# Patient Record
Sex: Male | Born: 1945 | Race: White | Hispanic: No | Marital: Single | Smoking: Former smoker
Health system: Southern US, Community
[De-identification: ages and names within clinical notes are randomized; demographics above are authoritative.]

## PROBLEM LIST (undated history)

## (undated) DIAGNOSIS — K5792 Diverticulitis of intestine, part unspecified, without perforation or abscess without bleeding: Secondary | ICD-10-CM

## (undated) DIAGNOSIS — H9190 Unspecified hearing loss, unspecified ear: Secondary | ICD-10-CM

## (undated) DIAGNOSIS — R9439 Abnormal result of other cardiovascular function study: Secondary | ICD-10-CM

## (undated) DIAGNOSIS — F329 Major depressive disorder, single episode, unspecified: Secondary | ICD-10-CM

## (undated) DIAGNOSIS — IMO0001 Reserved for inherently not codable concepts without codable children: Secondary | ICD-10-CM

## (undated) DIAGNOSIS — Z860100 Personal history of colon polyps, unspecified: Secondary | ICD-10-CM

## (undated) DIAGNOSIS — K219 Gastro-esophageal reflux disease without esophagitis: Secondary | ICD-10-CM

## (undated) DIAGNOSIS — E119 Type 2 diabetes mellitus without complications: Secondary | ICD-10-CM

## (undated) DIAGNOSIS — I10 Essential (primary) hypertension: Secondary | ICD-10-CM

## (undated) DIAGNOSIS — R42 Dizziness and giddiness: Secondary | ICD-10-CM

## (undated) DIAGNOSIS — F419 Anxiety disorder, unspecified: Secondary | ICD-10-CM

## (undated) DIAGNOSIS — A159 Respiratory tuberculosis unspecified: Secondary | ICD-10-CM

## (undated) DIAGNOSIS — M549 Dorsalgia, unspecified: Secondary | ICD-10-CM

## (undated) DIAGNOSIS — E785 Hyperlipidemia, unspecified: Secondary | ICD-10-CM

## (undated) DIAGNOSIS — E039 Hypothyroidism, unspecified: Secondary | ICD-10-CM

## (undated) DIAGNOSIS — R531 Weakness: Secondary | ICD-10-CM

## (undated) DIAGNOSIS — Z87442 Personal history of urinary calculi: Secondary | ICD-10-CM

## (undated) DIAGNOSIS — M1990 Unspecified osteoarthritis, unspecified site: Secondary | ICD-10-CM

## (undated) DIAGNOSIS — F32A Depression, unspecified: Secondary | ICD-10-CM

## (undated) DIAGNOSIS — K589 Irritable bowel syndrome without diarrhea: Secondary | ICD-10-CM

## (undated) DIAGNOSIS — R51 Headache: Secondary | ICD-10-CM

## (undated) DIAGNOSIS — G8929 Other chronic pain: Secondary | ICD-10-CM

## (undated) DIAGNOSIS — I2510 Atherosclerotic heart disease of native coronary artery without angina pectoris: Secondary | ICD-10-CM

## (undated) HISTORY — PX: LITHOTRIPSY: SUR834

## (undated) HISTORY — PX: COLONOSCOPY: SHX174

## (undated) HISTORY — PX: BACK SURGERY: SHX140

## (undated) HISTORY — PX: SHOULDER ARTHROSCOPY W/ ROTATOR CUFF REPAIR: SHX2400

## (undated) HISTORY — PX: CYSTOSCOPY: SUR368

## (undated) HISTORY — DX: Abnormal result of other cardiovascular function study: R94.39

## (undated) HISTORY — PX: OTHER SURGICAL HISTORY: SHX169

## (undated) HISTORY — PX: CORONARY ANGIOPLASTY: SHX604

---

## 1968-12-13 HISTORY — PX: CHOLECYSTECTOMY: SHX55

## 1997-10-25 ENCOUNTER — Emergency Department (HOSPITAL_COMMUNITY): Admission: EM | Admit: 1997-10-25 | Discharge: 1997-10-25 | Payer: Self-pay | Admitting: Internal Medicine

## 1997-11-20 ENCOUNTER — Inpatient Hospital Stay (HOSPITAL_COMMUNITY): Admission: EM | Admit: 1997-11-20 | Discharge: 1997-11-21 | Payer: Self-pay | Admitting: Emergency Medicine

## 1997-12-17 ENCOUNTER — Inpatient Hospital Stay (HOSPITAL_COMMUNITY): Admission: EM | Admit: 1997-12-17 | Discharge: 1997-12-19 | Payer: Self-pay | Admitting: Emergency Medicine

## 1997-12-25 ENCOUNTER — Encounter: Admission: RE | Admit: 1997-12-25 | Discharge: 1998-03-25 | Payer: Self-pay | Admitting: Internal Medicine

## 1999-05-07 ENCOUNTER — Emergency Department (HOSPITAL_COMMUNITY): Admission: EM | Admit: 1999-05-07 | Discharge: 1999-05-07 | Payer: Self-pay | Admitting: Emergency Medicine

## 1999-06-03 ENCOUNTER — Encounter: Payer: Self-pay | Admitting: Neurology

## 1999-06-03 ENCOUNTER — Encounter: Admission: RE | Admit: 1999-06-03 | Discharge: 1999-06-03 | Payer: Self-pay | Admitting: Neurology

## 2002-05-04 ENCOUNTER — Encounter: Payer: Self-pay | Admitting: Urology

## 2002-05-04 ENCOUNTER — Encounter: Admission: RE | Admit: 2002-05-04 | Discharge: 2002-05-04 | Payer: Self-pay | Admitting: Urology

## 2006-09-30 ENCOUNTER — Emergency Department (HOSPITAL_COMMUNITY): Admission: EM | Admit: 2006-09-30 | Discharge: 2006-09-30 | Payer: Self-pay | Admitting: Emergency Medicine

## 2006-10-05 ENCOUNTER — Encounter: Admission: RE | Admit: 2006-10-05 | Discharge: 2006-10-05 | Payer: Self-pay | Admitting: *Deleted

## 2006-10-09 ENCOUNTER — Encounter (INDEPENDENT_AMBULATORY_CARE_PROVIDER_SITE_OTHER): Payer: Self-pay | Admitting: *Deleted

## 2006-10-09 ENCOUNTER — Ambulatory Visit (HOSPITAL_COMMUNITY): Admission: RE | Admit: 2006-10-09 | Discharge: 2006-10-09 | Payer: Self-pay | Admitting: *Deleted

## 2007-01-15 ENCOUNTER — Emergency Department (HOSPITAL_COMMUNITY): Admission: EM | Admit: 2007-01-15 | Discharge: 2007-01-15 | Payer: Self-pay | Admitting: *Deleted

## 2007-04-15 HISTORY — PX: ANTERIOR CERVICAL DECOMP/DISCECTOMY FUSION: SHX1161

## 2007-04-19 ENCOUNTER — Ambulatory Visit (HOSPITAL_COMMUNITY): Admission: RE | Admit: 2007-04-19 | Discharge: 2007-04-20 | Payer: Self-pay | Admitting: Neurosurgery

## 2007-04-22 ENCOUNTER — Encounter: Admission: RE | Admit: 2007-04-22 | Discharge: 2007-04-22 | Payer: Self-pay | Admitting: Neurosurgery

## 2007-08-10 ENCOUNTER — Ambulatory Visit (HOSPITAL_COMMUNITY): Admission: RE | Admit: 2007-08-10 | Discharge: 2007-08-10 | Payer: Self-pay | Admitting: Physician Assistant

## 2007-08-11 ENCOUNTER — Encounter: Admission: RE | Admit: 2007-08-11 | Discharge: 2007-08-11 | Payer: Self-pay | Admitting: Interventional Cardiology

## 2007-11-27 ENCOUNTER — Encounter: Admission: RE | Admit: 2007-11-27 | Discharge: 2007-11-27 | Payer: Self-pay | Admitting: Neurosurgery

## 2007-12-29 ENCOUNTER — Encounter: Admission: RE | Admit: 2007-12-29 | Discharge: 2007-12-29 | Payer: Self-pay | Admitting: Neurosurgery

## 2008-10-14 ENCOUNTER — Emergency Department (HOSPITAL_COMMUNITY): Admission: EM | Admit: 2008-10-14 | Discharge: 2008-10-14 | Payer: Self-pay | Admitting: Emergency Medicine

## 2008-10-30 ENCOUNTER — Inpatient Hospital Stay (HOSPITAL_COMMUNITY): Admission: RE | Admit: 2008-10-30 | Discharge: 2008-11-05 | Payer: Self-pay | Admitting: Neurosurgery

## 2008-10-31 ENCOUNTER — Ambulatory Visit: Payer: Self-pay | Admitting: Pulmonary Disease

## 2009-09-30 ENCOUNTER — Inpatient Hospital Stay (HOSPITAL_COMMUNITY): Admission: EM | Admit: 2009-09-30 | Discharge: 2009-10-04 | Payer: Self-pay | Admitting: Emergency Medicine

## 2010-04-29 ENCOUNTER — Encounter
Admission: RE | Admit: 2010-04-29 | Discharge: 2010-04-29 | Payer: Self-pay | Source: Home / Self Care | Attending: Neurosurgery | Admitting: Neurosurgery

## 2010-06-30 LAB — GLUCOSE, CAPILLARY
Glucose-Capillary: 108 mg/dL — ABNORMAL HIGH (ref 70–99)
Glucose-Capillary: 109 mg/dL — ABNORMAL HIGH (ref 70–99)
Glucose-Capillary: 112 mg/dL — ABNORMAL HIGH (ref 70–99)
Glucose-Capillary: 129 mg/dL — ABNORMAL HIGH (ref 70–99)
Glucose-Capillary: 135 mg/dL — ABNORMAL HIGH (ref 70–99)
Glucose-Capillary: 138 mg/dL — ABNORMAL HIGH (ref 70–99)
Glucose-Capillary: 147 mg/dL — ABNORMAL HIGH (ref 70–99)
Glucose-Capillary: 174 mg/dL — ABNORMAL HIGH (ref 70–99)
Glucose-Capillary: 205 mg/dL — ABNORMAL HIGH (ref 70–99)
Glucose-Capillary: 95 mg/dL (ref 70–99)
Glucose-Capillary: 97 mg/dL (ref 70–99)

## 2010-06-30 LAB — BASIC METABOLIC PANEL
BUN: 6 mg/dL (ref 6–23)
BUN: 7 mg/dL (ref 6–23)
BUN: 9 mg/dL (ref 6–23)
Calcium: 8.9 mg/dL (ref 8.4–10.5)
Calcium: 9.2 mg/dL (ref 8.4–10.5)
Chloride: 101 mEq/L (ref 96–112)
Chloride: 104 mEq/L (ref 96–112)
Creatinine, Ser: 0.83 mg/dL (ref 0.4–1.5)
GFR calc Af Amer: >60 mL/min (ref >60)
GFR calc non Af Amer: >60 mL/min (ref >60)
GFR calc non Af Amer: >60 mL/min (ref >60)
GFR calc non Af Amer: >60 mL/min (ref >60)
Glucose, Bld: 121 mg/dL — ABNORMAL HIGH (ref 70–99)
Glucose, Bld: 132 mg/dL — ABNORMAL HIGH (ref 70–99)
Glucose, Bld: 135 mg/dL — ABNORMAL HIGH (ref 70–99)
Glucose, Bld: 193 mg/dL — ABNORMAL HIGH (ref 70–99)
Potassium: 4 mEq/L (ref 3.5–5.1)
Potassium: 4.1 mEq/L (ref 3.5–5.1)
Potassium: 4.4 mEq/L (ref 3.5–5.1)
Sodium: 136 mEq/L (ref 135–145)
Sodium: 136 mEq/L (ref 135–145)

## 2010-06-30 LAB — CBC
HCT: 38.5 % — ABNORMAL LOW (ref 39.0–52.0)
HCT: 40.9 % (ref 39.0–52.0)
Hemoglobin: 12.6 g/dL — ABNORMAL LOW (ref 13.0–17.0)
Hemoglobin: 14 g/dL (ref 13.0–17.0)
Hemoglobin: 14.6 g/dL (ref 13.0–17.0)
MCHC: 34.6 g/dL (ref 30.0–36.0)
MCV: 88.4 fL (ref 78.0–100.0)
MCV: 88.7 fL (ref 78.0–100.0)
Platelets: 165 10*3/uL (ref 150–400)
Platelets: 180 10*3/uL (ref 150–400)
Platelets: 184 10*3/uL (ref 150–400)
Platelets: 193 10*3/uL (ref 150–400)
RBC: 4.46 MIL/uL (ref 4.22–5.81)
RDW: 13.5 % (ref 11.5–15.5)
RDW: 13.6 % (ref 11.5–15.5)
RDW: 14.2 % (ref 11.5–15.5)
WBC: 10.1 10*3/uL (ref 4.0–10.5)
WBC: 6.4 10*3/uL (ref 4.0–10.5)
WBC: 6.6 10*3/uL (ref 4.0–10.5)
WBC: 8.8 10*3/uL (ref 4.0–10.5)

## 2010-06-30 LAB — URINALYSIS, ROUTINE W REFLEX MICROSCOPIC
Glucose, UA: NEGATIVE mg/dL
Nitrite: NEGATIVE
Specific Gravity, Urine: 1.021 (ref 1.005–1.030)
pH: 5 (ref 5.0–8.0)

## 2010-06-30 LAB — COMPREHENSIVE METABOLIC PANEL
ALT: 16 U/L (ref 0–53)
ALT: 18 U/L (ref 0–53)
Albumin: 4.2 g/dL (ref 3.5–5.2)
Alkaline Phosphatase: 39 U/L (ref 39–117)
Alkaline Phosphatase: 41 U/L (ref 39–117)
BUN: 12 mg/dL (ref 6–23)
BUN: 8 mg/dL (ref 6–23)
CO2: 28 mEq/L (ref 19–32)
Chloride: 101 mEq/L (ref 96–112)
Chloride: 98 mEq/L (ref 96–112)
GFR calc non Af Amer: >60 mL/min (ref >60)
Glucose, Bld: 108 mg/dL — ABNORMAL HIGH (ref 70–99)
Glucose, Bld: 108 mg/dL — ABNORMAL HIGH (ref 70–99)
Potassium: 4 mEq/L (ref 3.5–5.1)
Potassium: 4.9 mEq/L (ref 3.5–5.1)
Sodium: 133 mEq/L — ABNORMAL LOW (ref 135–145)
Sodium: 134 mEq/L — ABNORMAL LOW (ref 135–145)
Total Bilirubin: 0.8 mg/dL (ref 0.3–1.2)
Total Bilirubin: 0.9 mg/dL (ref 0.3–1.2)
Total Protein: 6.6 g/dL (ref 6.0–8.3)

## 2010-06-30 LAB — DIFFERENTIAL
Basophils Absolute: 0 10*3/uL (ref 0.0–0.1)
Basophils Relative: 1 % (ref 0–1)
Eosinophils Absolute: 0.3 10*3/uL (ref 0.0–0.7)
Monocytes Absolute: 0.6 10*3/uL (ref 0.1–1.0)
Monocytes Relative: 6 % (ref 3–12)
Neutro Abs: 7.7 10*3/uL (ref 1.7–7.7)

## 2010-06-30 LAB — AMYLASE: Amylase: 44 U/L (ref 0–105)

## 2010-06-30 LAB — CULTURE, BLOOD (ROUTINE X 2): Culture: NO GROWTH

## 2010-07-21 LAB — DIFFERENTIAL
Basophils Absolute: 0 10*3/uL (ref 0.0–0.1)
Basophils Absolute: 0 10*3/uL (ref 0.0–0.1)
Basophils Relative: 0 % (ref 0–1)
Basophils Relative: 0 % (ref 0–1)
Eosinophils Absolute: 0.3 10*3/uL (ref 0.0–0.7)
Eosinophils Absolute: 0.3 10*3/uL (ref 0.0–0.7)
Eosinophils Relative: 3 % (ref 0–5)
Eosinophils Relative: 5 % (ref 0–5)
Lymphocytes Relative: 14 % (ref 12–46)
Lymphocytes Relative: 6 % — ABNORMAL LOW (ref 12–46)
Lymphs Abs: 0.7 10*3/uL (ref 0.7–4.0)
Lymphs Abs: 1 10*3/uL (ref 0.7–4.0)
Monocytes Absolute: 0.7 10*3/uL (ref 0.1–1.0)
Monocytes Absolute: 0.8 10*3/uL (ref 0.1–1.0)
Monocytes Relative: 10 % (ref 3–12)
Monocytes Relative: 8 % (ref 3–12)
Neutro Abs: 4.9 10*3/uL (ref 1.7–7.7)
Neutro Abs: 8.9 10*3/uL — ABNORMAL HIGH (ref 1.7–7.7)
Neutrophils Relative %: 70 % (ref 43–77)
Neutrophils Relative %: 83 % — ABNORMAL HIGH (ref 43–77)

## 2010-07-21 LAB — TYPE AND SCREEN
ABO/RH(D): A POS
Antibody Screen: NEGATIVE

## 2010-07-21 LAB — CBC
HCT: 27.8 % — ABNORMAL LOW (ref 39.0–52.0)
HCT: 29.4 % — ABNORMAL LOW (ref 39.0–52.0)
HCT: 29.9 % — ABNORMAL LOW (ref 39.0–52.0)
HCT: 32.4 % — ABNORMAL LOW (ref 39.0–52.0)
HCT: 33.3 % — ABNORMAL LOW (ref 39.0–52.0)
HCT: 43 % (ref 39.0–52.0)
Hemoglobin: 10.1 g/dL — ABNORMAL LOW (ref 13.0–17.0)
Hemoglobin: 10.4 g/dL — ABNORMAL LOW (ref 13.0–17.0)
Hemoglobin: 11.2 g/dL — ABNORMAL LOW (ref 13.0–17.0)
Hemoglobin: 11.5 g/dL — ABNORMAL LOW (ref 13.0–17.0)
Hemoglobin: 9.7 g/dL — ABNORMAL LOW (ref 13.0–17.0)
Hemoglobin: 14.8 g/dL (ref 13.0–17.0)
MCHC: 34.3 g/dL (ref 30.0–36.0)
MCHC: 34.6 g/dL (ref 30.0–36.0)
MCHC: 34.9 g/dL (ref 30.0–36.0)
MCHC: 34.4 g/dL (ref 30.0–36.0)
MCV: 88.8 fL (ref 78.0–100.0)
MCV: 89.6 fL (ref 78.0–100.0)
MCV: 89.9 fL (ref 78.0–100.0)
MCV: 89.3 fL (ref 78.0–100.0)
Platelets: 133 10*3/uL — ABNORMAL LOW (ref 150–400)
Platelets: 150 10*3/uL (ref 150–400)
Platelets: 221 10*3/uL (ref 150–400)
RBC: 3.13 MIL/uL — ABNORMAL LOW (ref 4.22–5.81)
RBC: 3.29 MIL/uL — ABNORMAL LOW (ref 4.22–5.81)
RBC: 3.33 MIL/uL — ABNORMAL LOW (ref 4.22–5.81)
RBC: 3.6 MIL/uL — ABNORMAL LOW (ref 4.22–5.81)
RBC: 4.81 MIL/uL (ref 4.22–5.81)
RDW: 13.9 % (ref 11.5–15.5)
RDW: 14 % (ref 11.5–15.5)
RDW: 14 % (ref 11.5–15.5)
RDW: 13.6 % (ref 11.5–15.5)
WBC: 10.4 10*3/uL (ref 4.0–10.5)
WBC: 10.7 10*3/uL — ABNORMAL HIGH (ref 4.0–10.5)
WBC: 6.9 10*3/uL (ref 4.0–10.5)
WBC: 9.4 10*3/uL (ref 4.0–10.5)
WBC: 8.9 10*3/uL (ref 4.0–10.5)

## 2010-07-21 LAB — BASIC METABOLIC PANEL
BUN: 18 mg/dL (ref 6–23)
BUN: 28 mg/dL — ABNORMAL HIGH (ref 6–23)
BUN: 30 mg/dL — ABNORMAL HIGH (ref 6–23)
BUN: 31 mg/dL — ABNORMAL HIGH (ref 6–23)
BUN: 14 mg/dL (ref 6–23)
CO2: 20 mEq/L (ref 19–32)
CO2: 22 mEq/L (ref 19–32)
CO2: 23 mEq/L (ref 19–32)
CO2: 27 mEq/L (ref 19–32)
Calcium: 7.4 mg/dL — ABNORMAL LOW (ref 8.4–10.5)
Calcium: 7.7 mg/dL — ABNORMAL LOW (ref 8.4–10.5)
Calcium: 7.9 mg/dL — ABNORMAL LOW (ref 8.4–10.5)
Calcium: 10.2 mg/dL (ref 8.4–10.5)
Chloride: 102 mEq/L (ref 96–112)
Chloride: 105 mEq/L (ref 96–112)
Chloride: 105 mEq/L (ref 96–112)
Chloride: 107 mEq/L (ref 96–112)
Chloride: 107 mEq/L (ref 96–112)
Chloride: 101 mEq/L (ref 96–112)
Creatinine, Ser: 0.93 mg/dL (ref 0.4–1.5)
Creatinine, Ser: 1.3 mg/dL (ref 0.4–1.5)
Creatinine, Ser: 0.85 mg/dL (ref 0.4–1.5)
GFR calc Af Amer: 32 mL/min — ABNORMAL LOW (ref >60)
GFR calc Af Amer: 35 mL/min — ABNORMAL LOW (ref >60)
GFR calc Af Amer: 46 mL/min — ABNORMAL LOW (ref >60)
GFR calc Af Amer: >60 mL/min (ref >60)
GFR calc Af Amer: >60 mL/min (ref >60)
GFR calc Af Amer: >60 mL/min (ref >60)
GFR calc non Af Amer: 26 mL/min — ABNORMAL LOW (ref >60)
GFR calc non Af Amer: 29 mL/min — ABNORMAL LOW (ref >60)
GFR calc non Af Amer: 56 mL/min — ABNORMAL LOW (ref >60)
GFR calc non Af Amer: >60 mL/min (ref >60)
GFR calc non Af Amer: >60 mL/min (ref >60)
Glucose, Bld: 67 mg/dL — ABNORMAL LOW (ref 70–99)
Glucose, Bld: 87 mg/dL (ref 70–99)
Glucose, Bld: 92 mg/dL (ref 70–99)
Glucose, Bld: 97 mg/dL (ref 70–99)
Glucose, Bld: 97 mg/dL (ref 70–99)
Potassium: 4.1 mEq/L (ref 3.5–5.1)
Potassium: 4.7 mEq/L (ref 3.5–5.1)
Potassium: 5.1 mEq/L (ref 3.5–5.1)
Potassium: 5.6 mEq/L — ABNORMAL HIGH (ref 3.5–5.1)
Potassium: 6.1 mEq/L — ABNORMAL HIGH (ref 3.5–5.1)
Potassium: 4.9 mEq/L (ref 3.5–5.1)
Sodium: 132 mEq/L — ABNORMAL LOW (ref 135–145)
Sodium: 132 mEq/L — ABNORMAL LOW (ref 135–145)
Sodium: 134 mEq/L — ABNORMAL LOW (ref 135–145)
Sodium: 135 mEq/L (ref 135–145)
Sodium: 135 mEq/L (ref 135–145)

## 2010-07-21 LAB — GLUCOSE, CAPILLARY
Glucose-Capillary: 103 mg/dL — ABNORMAL HIGH (ref 70–99)
Glucose-Capillary: 103 mg/dL — ABNORMAL HIGH (ref 70–99)
Glucose-Capillary: 106 mg/dL — ABNORMAL HIGH (ref 70–99)
Glucose-Capillary: 106 mg/dL — ABNORMAL HIGH (ref 70–99)
Glucose-Capillary: 108 mg/dL — ABNORMAL HIGH (ref 70–99)
Glucose-Capillary: 109 mg/dL — ABNORMAL HIGH (ref 70–99)
Glucose-Capillary: 112 mg/dL — ABNORMAL HIGH (ref 70–99)
Glucose-Capillary: 113 mg/dL — ABNORMAL HIGH (ref 70–99)
Glucose-Capillary: 114 mg/dL — ABNORMAL HIGH (ref 70–99)
Glucose-Capillary: 115 mg/dL — ABNORMAL HIGH (ref 70–99)
Glucose-Capillary: 115 mg/dL — ABNORMAL HIGH (ref 70–99)
Glucose-Capillary: 116 mg/dL — ABNORMAL HIGH (ref 70–99)
Glucose-Capillary: 123 mg/dL — ABNORMAL HIGH (ref 70–99)
Glucose-Capillary: 56 mg/dL — ABNORMAL LOW (ref 70–99)
Glucose-Capillary: 63 mg/dL — ABNORMAL LOW (ref 70–99)
Glucose-Capillary: 65 mg/dL — ABNORMAL LOW (ref 70–99)
Glucose-Capillary: 68 mg/dL — ABNORMAL LOW (ref 70–99)
Glucose-Capillary: 79 mg/dL (ref 70–99)
Glucose-Capillary: 83 mg/dL (ref 70–99)
Glucose-Capillary: 89 mg/dL (ref 70–99)
Glucose-Capillary: 91 mg/dL (ref 70–99)
Glucose-Capillary: 94 mg/dL (ref 70–99)
Glucose-Capillary: 96 mg/dL (ref 70–99)
Glucose-Capillary: 97 mg/dL (ref 70–99)
Glucose-Capillary: 97 mg/dL (ref 70–99)
Glucose-Capillary: 99 mg/dL (ref 70–99)

## 2010-07-21 LAB — CARDIAC PANEL(CRET KIN+CKTOT+MB+TROPI)
CK, MB: 17.4 ng/mL — ABNORMAL HIGH (ref 0.3–4.0)
Relative Index: 0.6 (ref 0.0–2.5)
Total CK: 1783 U/L — ABNORMAL HIGH (ref 7–232)
Total CK: 2264 U/L — ABNORMAL HIGH (ref 7–232)
Troponin I: 0.02 ng/mL (ref 0.00–0.06)

## 2010-07-21 LAB — URINE CULTURE: Culture: NO GROWTH

## 2010-07-21 LAB — URINALYSIS, MICROSCOPIC ONLY
Ketones, ur: NEGATIVE mg/dL
Nitrite: NEGATIVE
Protein, ur: NEGATIVE mg/dL
pH: 5 (ref 5.0–8.0)

## 2010-07-21 LAB — CREATININE, URINE, RANDOM: Creatinine, Urine: 266.3 mg/dL

## 2010-07-21 LAB — ABO/RH: ABO/RH(D): A POS

## 2010-08-27 NOTE — Op Note (Signed)
[name], [name]                ACCOUNT NO.:  [id number]   MEDICAL RECORD NO.:  [id number]          PATIENT TYPE:  AMB   LOCATION:  ENDO                         FACILITY:  [facility]   PHYSICIAN:  Georgiana Spinner, M.D.    DATE OF BIRTH:  08-29-1945   DATE OF PROCEDURE:  10/09/2006  DATE OF DISCHARGE:                               OPERATIVE REPORT   PROCEDURE:  Colonoscopy   INDICATIONS:  Rectal bleeding, abdominal pain.   ANESTHESIA:  Fentanyl 25 mcg, Versed 4.5 mg.   DESCRIPTION OF PROCEDURE:  With the patient mildly sedated in the left  lateral decubitus position, a rectal examination was performed which was  unremarkable to my examination.  Subsequently the Pentax videoscopic  colonoscope was inserted into the rectum and passed under direct vision  with pressure applied.  We reached the cecum, identified by ileocecal  valve and appendiceal orifice, both of which were photographed.   From this point the colonoscope was slowly withdrawn taking  circumferential views of the colonic mucosa, stopping only in the rectum  which appeared normal; on direct showed hemorrhoids and retroflex view.  The endoscope was straightened and withdrawn.  The patient's vital  signs, and pulse oximeter remained stable.  The patient tolerated the  procedure well without apparent complications.   FINDINGS:  Internal hemorrhoids, otherwise unremarkable exam.   PLAN:  See endoscopy note for further details.           ______________________________  Georgiana Spinner, M.D.     GMO/MEDQ  D:  10/09/2006  T:  10/09/2006  Job:  [id number]

## 2010-08-27 NOTE — Op Note (Signed)
NAMEHARRIE, [name]                ACCOUNT NO.:  [id number]   MEDICAL RECORD NO.:  [id number]          PATIENT TYPE:  INP   LOCATION:  3034                         FACILITY:  [facility]   PHYSICIAN:  Hewitt Shorts, M.D.DATE OF BIRTH:  23-Jan-1946   DATE OF PROCEDURE:  10/30/2008  DATE OF DISCHARGE:  10/14/2008                               OPERATIVE REPORT   PREOPERATIVE DIAGNOSES:  1. Lumbar stenosis.  2. Lumbar spondylosis.  3. Lumbar degenerative disk disease.  4. Lumbar degenerative dynamic spondylolisthesis.  5. Lumbar radiculopathy.   POSTOPERATIVE DIAGNOSES:  1. Lumbar stenosis.  2. Lumbar spondylosis.  3. Lumbar degenerative disk disease.  4. Lumbar degenerative dynamic spondylolisthesis.  5. Lumbar radiculopathy.   PROCEDURES:  1. L4, L5 and S1 lumbar laminectomy.  2. Bilateral L4-5 and L5-S1 facetectomy and foraminotomies with      decompression of the exiting L4, L5, and S1 nerve roots bilaterally      with microdissection with decompression beyond that required for      interbody fusion.  3. Bilateral L4-5 and L5-S1 posterior lumbar interbody arthrodesis      with AVS PEEK interbody implants and mosaic with bone marrow      aspirate.  4. L4-S1 posterior arthrodesis with radius posterior instrumentation,      Infuse, and mosaic with bone marrow aspirate.   SURGEON:  Hewitt Shorts, M.D.   ASSISTANTS:  Nelia Shi. Webb Silversmith, RN. and Hilda Lias, M.D.   ANESTHESIA:  General endotracheal.   INDICATION:  The patient is a 65 year old man.  He has been having  difficulties with a left lumbar radicular pain.  He has a history of  previous left L5-S1 lumbar diskectomy by Dr. Wyonia Hough, many years ago.  He had 2 surgeries by Dr. Wyonia Hough.  X-ray showed advanced degenerative  changes in the lumbar spine with a dynamic degenerative grade-1  spondylolisthesis at L4 and L5 and marked disk space narrowing at L5-S1.  MRI scan confirms a multilevel degeneration with  advanced facet  arthropathy and bilateral neuroforaminal stenosis, worse in the left  than the right as well as left L5-S1 lateral recess stenosis and some L4-  5 lateral recess stenosis.  Decision was made to proceed with  decompression and stabilization.   PROCEDURE:  The patient was brought to the operating room and placed  under general endotracheal anesthesia.  The patient was turned to prone  position.  The lumbar region was prepped with Betadine soap and solution  and draped in a sterile fashion.  The midline was infiltrated with local  anesthetic with epinephrine.  There was a previous midline incision.  This was reopened.  Incision was extended somewhat rostrally and  caudally, and dissection was carried down through the subcutaneous  tissue.  Bipolar cautery and electrocautery were used to maintain  hemostasis.  Dissection was carried down to the lumbar fascia, which was  incised bilaterally, and the paraspinal muscle was dissected from the  spinous process and lamina in a subperiosteal fashion.  Particular care  was taken on the left side at the L5-S1 level where his previous  laminotomy had been performed.  Dissection was carried out laterally.  The hypertrophic facet arthropathy was identified.  X-rays were taken,  and we identified the L4-5 and L5-S1 interlaminar spaces.   Using magnification and using microdissection and microsurgical  technique, we performed a decompression performing an inferior L4  laminectomy, a complete L5 laminectomy and a superior S1 laminectomy.  Particular care was taken around the left side of the L5-S1 where the  previous laminotomy had been performed.  Ligamentum flavum was markedly  thickened; this was carefully removed as well, and there was extensive  hypertrophic facet arthropathy.  This was removed using the high-speed  drill.  There was calcification of scar tissue and ligamentum laterally  on the left at L5-S1.  This was removed, and we  were able to decompress  the exiting L4, L5, and S1 nerve roots bilaterally.  Once the  facetectomies and foraminotomies were completed to complete the  decompression, we identified the annulus bilaterally.  The overlying  epidural veins were coagulated and divided, and then the annulus was  incised bilaterally at each level.  At the L5-S1 level, there was  extensive calcification of the posterior annulus and spurring, and this  was carefully removed using Kerrison punches.  At each level, we entered  into the disk space, and using a variety of microcurettes and pituitary  rongeurs, a very thorough diskectomy was performed.  We removed the  cauterized endplates using both microcurettes as well as paddle  curettes, and the disk spaces and endplates were prepared for interbody  arthrodesis.  Once the decompression was completed and the interbody  space preparation was completed, we then proceeded with the posterior  arthrodesis.  We brought the C-arm fluoroscope into the field.  We  probed the left L5 pedicle, and aspirated bone marrow aspirate was  injected over 15-mL strip of mosaic, and then the mosaic was packed into  the AVS PEEK interbody implants.  We used implants over 25 mm in depth  with 4 degrees of lordosis.  At the L4-L5 level, we placed 11-mm  implants bilaterally.  At the L5-S1 level because of a variation in the  height, we placed an 8-mm implant on the left and a 9-mm implant on the  right after having sized each of the disk spaces bilaterally.  Placing  these implants, we first placed the implants on the right side in each  level retracting the thecal sac and nerve root, tapping the implant  carefully into position.  It was countersunk.  We then went to the left  side, packed midline with mosaic with bone marrow aspirate, and then  placed a second implant from the left side again retracting the thecal  sac and nerve roots.  This was done first at the L5-S1 level and then   subsequently at the L4-5 level.   Once all 4 implants were in place, we then took additional mosaic with  bone marrow aspirate and packed it in the interbody space lateral to  each of the implants.   Once the interbody arthrodesis was complete, we proceeded with the  posterior arthrodesis.  Again using the C-arm fluoroscope, we identified  pedicle entry sites for L4 bilaterally, L5 on the right side, and S1  bilaterally.  Each of the 6 pedicles was probed and examined with a ball  probe.  Good bony surfaces were found.  No cutouts were found.  Each of  the pedicles was tapped with a 5.25-mm tap and again  examined with a  ball probe.  Good threading was noted.  No cutouts were found, and then  we placed 5.75-mm screws using 45-mm screws bilaterally at L4, 40-mm  screws bilaterally at L5, and 35-mm screws bilaterally at S1.  Once all  6 screws were in place, we selected rods.  Using a 70-mm rod on the left  side and a 60-mm rod on the right side, each of the rods was  prelordosed, and we secured them with locking caps.  Once all 6 locking  caps were on, final tightening was done of each of the 6 locking caps.  We then selected a medium-size crosslink that was placed between the L5  and S1 screws, secured to the rods bilaterally, and then tightened in  the middle and locked out.  We then packed Infuse and the remaining  mosaic with bone marrow aspirate in the facet joints and in the lateral  gutters.  We irrigated the wound numerous times in the procedure,  initially with saline solution and subsequently with bacitracin  solution.  We checked the decompression and ensured that none of the  bone graft material has gone into the decompression.  It was felt that  good decompression remained of the thecal sac and nerve roots  bilaterally, and then we proceeded with closure.   The paraspinal muscles were approximated with interrupted undyed #1  Vicryl sutures, deep fascia closed with  interrupted undyed #1 Vicryl  sutures, Scarpa fascia closed with interrupted undyed #1 Vicryl sutures,  the subcutaneous and subcuticular closed with interrupted inverted 2-0  undyed Vicryl sutures, and then the skin was approximated with  Dermabond.  The wound was dressed with adaptic and sterile gauze.  The  procedure was tolerated well.  The estimated blood loss was 400 mL.  The  patient was given back 275 mL of Cell Saver blood.  Sponge and needle  counts were correct.   Following the surgery, the patient was turned back to the supine  position to be reversed from the anesthetic, extubated, and transferred  to the recovery room for further care.      Hewitt Shorts, M.D.  Electronically Signed     RWN/MEDQ  D:  10/30/2008  T:  10/31/2008  Job:  [id number]

## 2010-08-27 NOTE — Op Note (Signed)
NAMEPEARCE, LITTLEFIELD                ACCOUNT NO.:  [id number]   MEDICAL RECORD NO.:  [id number]          PATIENT TYPE:  AMB   LOCATION:  ENDO                         [facility]AC[facility]   PHYSICIAN:  Georgiana Spinner, M.D.    DATE OF BIRTH:  1945/04/24   DATE OF PROCEDURE:  10/09/2006  DATE OF DISCHARGE:                               OPERATIVE REPORT   PROCEDURE:  Upper endoscopy.   INDICATIONS:  Abdominal pain.   ANESTHESIA:  Fentanyl 75 mcg, Versed 9 mg.   PROCEDURE:  With the patient mildly sedated in the left lateral  decubitus position, the Pentax videoscopic endoscope was inserted in the  mouth, passed under direct vision through the esophagus, which appeared  normal, until we reached the distal esophagus, and the squamocolumnar  junction was somewhat irregular.  This was photographed and biopsied.  We entered into the stomach.  The fundus, body appeared normal.  The  antrum, however, showed in the prepyloric area a number of linear  erosions  that were photographed and biopsied.  Duodenal bulb, second  portion of the duodenum were viewed and appeared normal.  From this  point the endoscope was slowly withdrawn taking circumferential views of  the duodenal mucosa until the endoscope had been pulled back into the  stomach and placed in retroflexion to view the stomach from below.  The  endoscope was then straightened and withdrawn taking circumferential  views of the remaining gastric and esophageal mucosa.  The patient's  vital signs and pulse oximeter remained stable.  The patient tolerated  the procedure well without apparent complications.   FINDINGS:  Erosions of antrum in the prepyloric area in a linear  fashion, probably nonsteroidal anti-inflammatory drug-induced and  question of Barrett's esophagus, biopsies taken.   Await biopsy reports.  The patient will call me for results and follow  up with me as needed as an outpatient.  Proceed to colonoscopy.     ______________________________  Georgiana Spinner, M.D.     GMO/MEDQ  D:  10/09/2006  T:  10/09/2006  Job:  [id number]

## 2010-08-27 NOTE — H&P (Signed)
NAMEDREAM, NODAL NO.:  [id number]   MEDICAL RECORD NO.:  [id number]          PATIENT TYPE:  INP   LOCATION:  3103                         FACILITY:  [facility]   PHYSICIAN:  Hewitt Shorts, M.D.DATE OF BIRTH:  1945-06-08   DATE OF ADMISSION:  10/30/2008  DATE OF DISCHARGE:  10/14/2008                              HISTORY & PHYSICAL   ADMISSION HISTORY AND PHYSICAL EXAMINATION:  The patient is a 65-year-  old right-handed white male who has been a patient of mine for over 21  years.  He is status post previous C5-C6 to C6-C7 anterior cervical  discectomy and fusion in January 2009.  He did well from that.  He has  had difficulties with his low back off and on over the years.  He has  had 2 previous lumbar surgeries by Dr. Ladora Daniel in the remote past.  However, he began to develop difficulties with low back and left lumbar  radicular pain in August 2009 after slipping in a shower.  He was  evaluated with x-rays and MRI scan.  X-ray showed a dynamic degenerative  grade 1 spondylolisthesis at L4 and L5 with advanced degeneration with  spondylosis and degenerative disk disease at the L4-5 and L5-S1 levels.  The patient was further study with MRI scan, which showed moderate  stenosis at the L4-L5 level and in particular left L4-L5 lateral recess  stenosis with advanced degeneration of both levels as well as  degenerative spondylolisthesis.   The patient was treated with extensive nonsurgical management including  repeated courses of NSAIDs as well as treated with medications for  neuropathic pain such as gabapentin as well as number of epidural  steroid injections with some transient relief but continued to have pain  and discomfort and because of persistent pain and discomfort and  increasing disability and disruption of his day-to-day activities, the  patient was brought now to surgery for definitive decompression  arthrodesis at the L4-5 and L5-S1  levels.   PAST MEDICAL HISTORY:  1. Notable for history of hypertension.  2. Diabetes.  3. Depression.   PREVIOUS SURGERY:  His two level C5-C6 and C6-C7 anterior cervical  discectomy and fusion, also status post cholecystectomy, has 2 previous  lumbar surgeries and cardiac catheterization.   ALLERGIES:  He reports allergy to SULFA causing rash.   MEDICATIONS:  1. Vicodin p.r.n. for pain.  2. Celexa 40 mg q.a.m.  3. Lisinopril 40 mg q.a.m.  4. Metformin 500 mg b.i.d.  5. Labetalol 200 mg b.i.d.   FAMILY HISTORY:  Mother in a late 43s in fairly good health.  Father  died at age 63 of colon cancer.   SOCIAL HISTORY:  The patient is retired.  He is unmarried.  He does not  smoke or drink beverages or have history of substance abuse.   REVIEW OF SYSTEMS:  Notable for those described in the history of  present illness and past medical history, but is otherwise unremarkable.   PHYSICAL EXAMINATION:  GENERAL:  The patient is obese white male, in no  acute distress.  VITAL SIGNS:  Temperature is 98, pulse 59, blood pressure 126/77,  respiratory rate 18, height is 5 feet 9 inch, and weight 268 pounds.  LUNGS:  Clear to auscultation.  He has symmetric respiratory excursion.  HEART:  Regular rate and rhythm.  Normal S1 and S2.  There is no murmur.  EXTREMITIES:  No clubbing, cyanosis, or edema.  MUSCULOSKELETAL:  Significant limitation of mobility due to pain and  discomfort and underlying arthritic degeneration and obesity.  NEUROLOGIC:  A 5/5 strength in the lower extremities in the iliopsoas,  quadriceps, dorsiflexors, extensor hallucis longus, and plantar flexor  bilaterally.  Sensation is symmetrical in the distal lower extremities.  Reflexes are minimal.  Toes are downgoing.  Gait and stance, he favors  the left lower extremity.   IMPRESSION:  The patient with extensive multilevel degenerative change  in the lower lumbar spine with a grade 1 dynamic degenerative   spondylolisthesis at L4 and L5 and moderate stenosis at L4-L5 level with  more advanced degenerative changes at L5-S1 with disk space narrowing  and particularly left L5-S1 lateral recess stenosis and neuroforaminal  stenosis.   PLAN:  The patient will be admitted for bilateral L4-5 and L5-S1 lumbar  decompression including laminectomy, fasciotomy, and foraminotomy with  bilateral L4-5 and L5-S1 posterior lumbar interbody fusion with  interbody implants and bone graft and bilateral L4-S1 posterolateral  arthrodesis with posterior instrumentation and bone graft.   I have discussed the nature of his condition, alternatives of surgery,  and nature of surgery and procedure, typical length of surgery, hospital  stay, and overall recuperation and limitations postoperatively, need for  postoperative immobilization in a lumbar brace, and risks including  risks of infection, bleeding, possible need for transfusion, risk of  nerve dysfunction with pain, weakness, numbness, or paresthesias, the  risk of failure of arthrodesis, and possible need of further surgery and  the risk of dural tear and CSF leakage, possible need for further  surgery and anesthetic risk of myocardial infarction, stroke, pneumonia,  and death.  Understanding all these, he wished to go ahead with surgery  and is admitted now for such.      Hewitt Shorts, M.D.  Electronically Signed     RWN/MEDQ  D:  11/01/2008  T:  11/01/2008  Job:  [id number]

## 2010-08-27 NOTE — Op Note (Signed)
[name], [name] NO.:  [id number]   MEDICAL RECORD NO.:  [id number]          PATIENT TYPE:  INP   LOCATION:  3172                         FACILITY:  [facility]   PHYSICIAN:  Hewitt Shorts, M.D.DATE OF BIRTH:  [birth year]   DATE OF PROCEDURE:  04/19/2007  DATE OF DISCHARGE:                               OPERATIVE REPORT   PREOPERATIVE DIAGNOSES:  Cervical spondylosis, cervical degenerative  disease, and cervical radiculopathy.   POSTOPERATIVE DIAGNOSES:  Cervical spondylosis, cervical degenerative  disease, and cervical radiculopathy.   PROCEDURE:  C5-C6 and C6-C7 anterior cervical decompression and  arthrodesis with allograft and Tether cervical plating.   SURGEON:  Hewitt Shorts, M.D.   ASSISTANT:  Nelia Shi. Georgina Peer and Payton Doughty, M.D.   ANESTHESIA:  General endotracheal.   INDICATIONS:  This is a pleasant 65 year old man who presented with  bilateral cervical radiculopathy.  He has had significant degenerative  spondylosis and degenerative disk disease at the C5-C6 and C6-C7 levels  with bilateral neural foraminal encroachment at each level.  Decision  was made to proceed with 2-level anterior decompression arthrodesis.   PROCEDURE:  The patient was brought to the operating room and placed  under general endotracheal anesthesia.  The patient was placed in 10  pounds of Holter traction.  The neck was prepped with Betadine soap and  solution and draped in sterile fashion.  A horizontal incision was made  in left side of the neck.  The line of the incision was infiltrated with  local anesthetic with epinephrine.  Dissection was carried down to the  subcutaneous tissue and platysma and then dissection was carried out  through an avascular plane leaving the sternocleidomastoid, carotid  artery, and jugular vein laterally, and trachea and esophagus medially.  The ventral aspect of the vertebral column was identified and localizing  x-ray taken  and C5-6 and C6-7 intervertebral disk space was identified.  Discectomy was begun with incising the annulus and continued with  microcurettes and pituitary rongeurs.  Anterior osteophytic overgrowth  was removed using double-action rongeur and osteophyte removal tool.  The microscope was draped and brought into the field to provide  additional navigation, illumination, and visualization.  The remainder  of decompression was performed using microdissection and microsurgical  technique.  Discectomy was continued with microcurettes and pituitary  rongeurs and then the cartilaginous endplates were removed using  microcurettes along with the X-Max drill.  Posterior osteophytic  overgrowth was removed using the X-Max drill along with 2-mm Kerrison  punch with a thin footplate.  The posterior longitudinal ligament was  carefully removed, and we decompressed the spinal canal and thecal sac  at each level and then proceeded to decompress the neural foramen  extending C6-C7 nerve roots bilaterally.  Once the decompression was  completed, hemostasis was established with the use of Gelfoam soaked in  thrombin, and once hemostasis was established, we measured the height of  the intervertebral disk spaces and selected two 7-mm implants, each was  hydrated in the saline solution and then the allograft implants were  placed in the intervertebral disk  space and countersunk.  We then  selected a 32-mm Tether cervical plating that was positioned over the  fusion construct and secured to the vertebra with 4 x 14-mm variable-  angle screws.  Each screw was placed by drilling screw hole and placing  the screw, placing in the alternating fashion, final tightening was done  after all 5 screws were in place.  An x-ray was taken, but the  visualization was limited due to shoulder screws in good position at C5  and under direct visualization in the wound we could see the plating  screws and grafts all in good  position.  The wound was irrigated with  bacitracin solution and checked for hemostasis, which was established,  confirmed and then we proceed with closure.  The Platysma was closed  with interrupted inverted 2-0 running Vicryl sutures.  The subcutaneous  and subcuticular layer closed with interrupted inverted 3-0 running  Vicryl sutures.  The skin was reapproximated with Dermabond.  The  procedure was tolerated well.  The estimated blood loss was 25 cc.  Sponge counts were correct.  Following surgery, the patient was taken  out of cervical traction, reversed from the anesthetic, extubated, and  transferred to the recovery room for further care where he was noted to  be moving all 4 extremities to command.      Hewitt Shorts, M.D.  Electronically Signed     RWN/MEDQ  D:  04/19/2007  T:  04/19/2007  Job:  [id number]

## 2010-08-30 NOTE — Discharge Summary (Signed)
NAMECON, ARGANBRIGHT NO.:  [id number]   MEDICAL RECORD NO.:  [id number]          PATIENT TYPE:  INP   LOCATION:  3040                         FACILITY:  [facility]   PHYSICIAN:  Hewitt Shorts, M.D.DATE OF BIRTH:  08/27/1945   DATE OF ADMISSION:  10/30/2008  DATE OF DISCHARGE:  11/05/2008                               DISCHARGE SUMMARY   ADMISSION HISTORY AND PHYSICAL EXAMINATION:  The patient is a 65-year-  old man who has been my patient for many years.  He has had previous  cervical spine surgery.  He has had 2 previous lumbar surgeries by Dr.  Wyonia Hough in the remote past.  He developed low back and left lumbar  radicular pain about a year ago.  X-ray revealed a dynamic degenerative  grade 1 spondylolisthesis at L4-5 with advanced spondylosis and  degenerative disk disease at both the L4-5 and L5-S1 levels with  moderate stenosis at L4-5 and left L4-5 lateral recess stenosis.  A  decision was made to proceed with decompression and arthrodesis after  having tried extensive nonsurgical management over the past year.  Further details of his history included in his admission history and  physical examination.  Exam at time of admission showed an obese male  with temperature of 98, pulse 59, blood pressure 126/77, respiratory  rate 18.  General examination was unremarkable other than for limited  mobility due to pain and arthritis and obesity.  Neurologic examination  reveals 5/5 strength.   HOSPITAL COURSE:  The patient was admitted, underwent an L4, L5, and S1  decompression with lumbar laminectomy, L4-5 and L5-S1 posterior lumbar  interbody fusion with posterolateral arthrodesis with interbody implants  and posterior instrumentation.  Postoperatively, he had some  difficulties with both hypotension and hypoglycemia.  He required  treatment for mild hypoglycemia, and his diabetic medication  (Glucophage) and his antihypertensive medications (lisinopril and  labetalol) were stopped.  He was transferred to the intensive care unit  and the Critical Care Medicine Service was consulted.  He was seen by  Dr. Vassie Loll who placed a central line.  The patient was resuscitated with  intravenous fluids and progressively stabilized.  We were able to resume  his 1800-calorie ADA diet and he is able to steadily improve his  activity.  He was able to transfer from bed to standing and then  ambulate in the ICU in his lumbar brace.  Laboratories were monitored.  Foley catheter was left in place to monitor urine output, and the  patient was steadily hydrated and improved and we were able to transfer  him back to the regular floor out of the ICU.  We arranged for the  patient to have rolling walker with 5-inch wheels and 3-in-1 for use at  home, and the patient was able to have his central line and Foley  catheter removed.  His vital signs and blood sugars stabilized off of  his antihypertensives and diabetic medications and those were not  resumed.  He was instructed that during the week following discharge he  is to return to his primary physician to  have his blood pressure and  blood sugars rechecked and to have his primary physician resume  medications as needed.  He was given prescription for Percocet 1-2  tablets p.o. q.4 h. p.r.n. pain, 60 tablets without refills; Flexeril 10  mg t.i.d. p.r.n. muscle spasm, 60 tablets, 1 refill; and Relafen 500 mg  b.i.d., 60 tablets and 1 refill.  His wound was healing nicely, and he  was discharged to home by Dr. Tressie Stalker with instruction to follow  up with me in the office in 3 weeks.  He was given instructions  regarding wound care and activities.   DISCHARGE DIAGNOSES:  1. L4-5 grade 1 dynamic degenerative spondylolisthesis.  2. Lumbar stenosis.  3. Lumbar spondylosis.  4. Lumbar degenerative disk disease.  5. Low back pain.  6. Lumbar radiculopathy.  7. Postoperative hypotension, now resolved.  8. Diabetes  with postoperative hypoglycemia, now resolved.      Hewitt Shorts, M.D.  Electronically Signed     Hewitt Shorts, M.D.  Electronically Signed    RWN/MEDQ  D:  11/22/2008  T:  11/22/2008  Job:  [id number]

## 2011-01-17 LAB — BASIC METABOLIC PANEL
BUN: 19
CO2: 26
Calcium: 9.6
Chloride: 102
Creatinine, Ser: 1.03
GFR calc Af Amer: >60
GFR calc non Af Amer: >60
Glucose, Bld: 120 — ABNORMAL HIGH
Potassium: 4.5
Sodium: 136

## 2011-01-17 LAB — CBC
HCT: 41.9
Hemoglobin: 14.4
MCHC: 34.3
MCV: 87.7
Platelets: 228
RBC: 4.78
RDW: 13.4
WBC: 8.2

## 2011-01-23 LAB — CBC
MCHC: 34.7
MCV: 87.3
Platelets: 185
RBC: 4.63
WBC: 7

## 2011-01-23 LAB — DIFFERENTIAL
Basophils Relative: 1
Eosinophils Absolute: 0.3
Monocytes Relative: 9
Neutro Abs: 3.7
Neutrophils Relative %: 53

## 2011-01-23 LAB — POCT CARDIAC MARKERS
CKMB, poc: 2.4
Myoglobin, poc: 135
Operator id: 4295

## 2011-01-23 LAB — BASIC METABOLIC PANEL
BUN: 17
CO2: 22
Calcium: 9.4
Chloride: 102
Creatinine, Ser: 0.93
GFR calc Af Amer: >60

## 2011-01-29 LAB — COMPREHENSIVE METABOLIC PANEL
CO2: 24
Calcium: 9.7
Creatinine, Ser: 0.94
GFR calc non Af Amer: >60
Glucose, Bld: 88

## 2011-01-29 LAB — CBC
HCT: 44.7
Hemoglobin: 15.3
MCHC: 34.2
MCV: 86.9
Platelets: 209
RDW: 13.5

## 2011-01-29 LAB — URINALYSIS, ROUTINE W REFLEX MICROSCOPIC
Bilirubin Urine: NEGATIVE
Glucose, UA: NEGATIVE
Hgb urine dipstick: NEGATIVE
Ketones, ur: NEGATIVE
Protein, ur: NEGATIVE
Urobilinogen, UA: 0.2

## 2011-01-29 LAB — POCT CARDIAC MARKERS
Myoglobin, poc: 85
Operator id: 1627
Troponin i, poc: <0.05

## 2011-01-29 LAB — DIFFERENTIAL
Basophils Absolute: 0
Eosinophils Absolute: 0.3
Lymphocytes Relative: 29
Lymphs Abs: 2.2
Neutrophils Relative %: 60

## 2011-07-30 ENCOUNTER — Emergency Department (HOSPITAL_COMMUNITY): Payer: 59

## 2011-07-30 ENCOUNTER — Encounter (HOSPITAL_COMMUNITY): Payer: Self-pay | Admitting: *Deleted

## 2011-07-30 ENCOUNTER — Observation Stay (HOSPITAL_COMMUNITY)
Admission: EM | Admit: 2011-07-30 | Discharge: 2011-07-31 | Disposition: A | Discharge status: Home / Self Care | Payer: 59 | Source: Ambulatory Visit | Attending: Cardiology | Admitting: Cardiology

## 2011-07-30 DIAGNOSIS — E118 Type 2 diabetes mellitus with unspecified complications: Secondary | ICD-10-CM

## 2011-07-30 DIAGNOSIS — E669 Obesity, unspecified: Secondary | ICD-10-CM | POA: Diagnosis present

## 2011-07-30 DIAGNOSIS — Z23 Encounter for immunization: Secondary | ICD-10-CM | POA: Insufficient documentation

## 2011-07-30 DIAGNOSIS — I10 Essential (primary) hypertension: Secondary | ICD-10-CM | POA: Insufficient documentation

## 2011-07-30 DIAGNOSIS — R079 Chest pain, unspecified: Principal | ICD-10-CM | POA: Insufficient documentation

## 2011-07-30 DIAGNOSIS — I2510 Atherosclerotic heart disease of native coronary artery without angina pectoris: Secondary | ICD-10-CM | POA: Insufficient documentation

## 2011-07-30 DIAGNOSIS — E119 Type 2 diabetes mellitus without complications: Secondary | ICD-10-CM | POA: Insufficient documentation

## 2011-07-30 HISTORY — DX: Major depressive disorder, single episode, unspecified: F32.9

## 2011-07-30 HISTORY — DX: Atherosclerotic heart disease of native coronary artery without angina pectoris: I25.10

## 2011-07-30 HISTORY — DX: Essential (primary) hypertension: I10

## 2011-07-30 HISTORY — DX: Type 2 diabetes mellitus without complications: E11.9

## 2011-07-30 HISTORY — DX: Obesity, unspecified: E66.9

## 2011-07-30 HISTORY — DX: Unspecified osteoarthritis, unspecified site: M19.90

## 2011-07-30 HISTORY — DX: Anxiety disorder, unspecified: F41.9

## 2011-07-30 HISTORY — DX: Gastro-esophageal reflux disease without esophagitis: K21.9

## 2011-07-30 HISTORY — DX: Headache: R51

## 2011-07-30 HISTORY — DX: Depression, unspecified: F32.A

## 2011-07-30 HISTORY — DX: Respiratory tuberculosis unspecified: A15.9

## 2011-07-30 HISTORY — DX: Type 2 diabetes mellitus with unspecified complications: E11.8

## 2011-07-30 LAB — COMPREHENSIVE METABOLIC PANEL
ALT: 20 U/L (ref 0–53)
ALT: 20 U/L (ref 0–53)
AST: 15 U/L (ref 0–37)
Albumin: 4.1 g/dL (ref 3.5–5.2)
CO2: 23 mEq/L (ref 19–32)
CO2: 23 mEq/L (ref 19–32)
Calcium: 9.5 mg/dL (ref 8.4–10.5)
Calcium: 9.8 mg/dL (ref 8.4–10.5)
Chloride: 98 mEq/L (ref 96–112)
Creatinine, Ser: 0.73 mg/dL (ref 0.50–1.35)
Creatinine, Ser: 0.78 mg/dL (ref 0.50–1.35)
GFR calc Af Amer: >90 mL/min (ref >90)
GFR calc non Af Amer: >90 mL/min (ref >90)
Glucose, Bld: 116 mg/dL — ABNORMAL HIGH (ref 70–99)
Potassium: 4.1 mEq/L (ref 3.5–5.1)
Sodium: 133 mEq/L — ABNORMAL LOW (ref 135–145)
Total Protein: 7 g/dL (ref 6.0–8.3)
Total Protein: 7.6 g/dL (ref 6.0–8.3)

## 2011-07-30 LAB — DIFFERENTIAL
Basophils Absolute: 0.1 10*3/uL (ref 0.0–0.1)
Basophils Relative: 1 % (ref 0–1)
Basophils Relative: 1 % (ref 0–1)
Eosinophils Absolute: 0.3 10*3/uL (ref 0.0–0.7)
Eosinophils Relative: 4 % (ref 0–5)
Lymphocytes Relative: 31 % (ref 12–46)
Monocytes Relative: 7 % (ref 3–12)
Neutro Abs: 4.7 10*3/uL (ref 1.7–7.7)
Neutrophils Relative %: 57 % (ref 43–77)

## 2011-07-30 LAB — CARDIAC PANEL(CRET KIN+CKTOT+MB+TROPI)
CK, MB: 3.5 ng/mL (ref 0.3–4.0)
CK, MB: 3.2 ng/mL (ref 0.3–4.0)
Relative Index: 1.7 (ref 0.0–2.5)
Total CK: 196 U/L (ref 7–232)
Troponin I: <0.3 ng/mL (ref <0.30)
Troponin I: <0.3 ng/mL (ref <0.30)

## 2011-07-30 LAB — CBC
Hemoglobin: 14.5 g/dL (ref 13.0–17.0)
MCHC: 35.9 g/dL (ref 30.0–36.0)
MCHC: 35.5 g/dL (ref 30.0–36.0)
MCV: 84.4 fL (ref 78.0–100.0)
Platelets: 214 10*3/uL (ref 150–400)
RBC: 4.86 MIL/uL (ref 4.22–5.81)
RDW: 13.2 % (ref 11.5–15.5)
WBC: 7.1 10*3/uL (ref 4.0–10.5)
WBC: 8.2 10*3/uL (ref 4.0–10.5)

## 2011-07-30 LAB — PROTIME-INR: INR: 1.09 (ref 0.00–1.49)

## 2011-07-30 LAB — APTT: aPTT: 28 seconds (ref 24–37)

## 2011-07-30 MED ORDER — NITROGLYCERIN 2 % TD OINT
1.0000 [in_us] | TOPICAL_OINTMENT | Freq: Four times a day (QID) | TRANSDERMAL | Status: DC
  Administered 2011-07-30: 1 [in_us] via TOPICAL
  Filled 2011-07-30: qty 30
  Filled 2011-07-30: qty 1
  Filled 2011-07-30 (×3): qty 30

## 2011-07-30 MED ORDER — AMLODIPINE BESYLATE 10 MG PO TABS
10.0000 mg | ORAL_TABLET | Freq: Every day | ORAL | Status: DC
Start: 2011-07-31 — End: 2011-07-31
  Administered 2011-07-31: 10 mg via ORAL
  Filled 2011-07-30: qty 1

## 2011-07-30 MED ORDER — PNEUMOCOCCAL VAC POLYVALENT 25 MCG/0.5ML IJ INJ
0.5000 mL | INJECTION | INTRAMUSCULAR | Status: AC
  Administered 2011-07-31: 0.5 mL via INTRAMUSCULAR
  Filled 2011-07-30: qty 0.5

## 2011-07-30 MED ORDER — CALCIUM CARBONATE-VITAMIN D 500-200 MG-UNIT PO TABS
1.0000 | ORAL_TABLET | Freq: Every day | ORAL | Status: DC
  Administered 2011-07-31: 1 via ORAL
  Filled 2011-07-30: qty 1

## 2011-07-30 MED ORDER — ASPIRIN EC 81 MG PO TBEC
81.0000 mg | DELAYED_RELEASE_TABLET | Freq: Every day | ORAL | Status: DC
  Administered 2011-07-31: 81 mg via ORAL
  Filled 2011-07-30: qty 1

## 2011-07-30 MED ORDER — ENOXAPARIN SODIUM 40 MG/0.4ML ~~LOC~~ SOLN
40.0000 mg | Freq: Every day | SUBCUTANEOUS | Status: DC
  Administered 2011-07-30: 40 mg via SUBCUTANEOUS
  Filled 2011-07-30 (×2): qty 0.4

## 2011-07-30 MED ORDER — ONDANSETRON HCL 4 MG/2ML IJ SOLN: 4.0000 mg | Freq: Four times a day (QID) | INTRAMUSCULAR | Status: DC | PRN

## 2011-07-30 MED ORDER — ASPIRIN 81 MG PO CHEW
324.0000 mg | CHEWABLE_TABLET | Freq: Once | ORAL | Status: AC
  Administered 2011-07-30: 324 mg via ORAL
  Filled 2011-07-30: qty 4

## 2011-07-30 MED ORDER — NITROGLYCERIN 0.4 MG SL SUBL: 0.4000 mg | SUBLINGUAL_TABLET | SUBLINGUAL | Status: DC | PRN

## 2011-07-30 MED ORDER — LOSARTAN POTASSIUM 50 MG PO TABS
100.0000 mg | ORAL_TABLET | Freq: Every day | ORAL | Status: DC
  Administered 2011-07-31: 100 mg via ORAL
  Filled 2011-07-30: qty 2

## 2011-07-30 MED ORDER — ACETAMINOPHEN 325 MG PO TABS
650.0000 mg | ORAL_TABLET | ORAL | Status: DC | PRN
  Administered 2011-07-30: 650 mg via ORAL
  Filled 2011-07-30: qty 2

## 2011-07-30 MED ORDER — DULOXETINE HCL 60 MG PO CPEP
60.0000 mg | ORAL_CAPSULE | Freq: Every day | ORAL | Status: DC
  Administered 2011-07-31: 60 mg via ORAL
  Filled 2011-07-30: qty 1

## 2011-07-30 MED ORDER — NABUMETONE 750 MG PO TABS
750.0000 mg | ORAL_TABLET | Freq: Two times a day (BID) | ORAL | Status: DC
  Administered 2011-07-30 – 2011-07-31 (×2): 750 mg via ORAL
  Filled 2011-07-30 (×3): qty 1

## 2011-07-30 MED ORDER — CARVEDILOL 12.5 MG PO TABS
12.5000 mg | ORAL_TABLET | Freq: Two times a day (BID) | ORAL | Status: DC
  Administered 2011-07-31: 12.5 mg via ORAL
  Filled 2011-07-30 (×3): qty 1

## 2011-07-30 NOTE — ED Notes (Signed)
Pt comes from PCP, sent here for evaluation due to currently having CP. Pt ambulated into ED independently. Sent back with NT for EKG.

## 2011-07-30 NOTE — H&P (Addendum)
Admit date: 07/30/2011 Primary Physician  : Dr. Anna Genre [facility]) Primary Cardiologist  Verdis Prime, MD  CC: Chest pain  HPI: 59 with DM, HTN, 50% non flow limiting CAD in 1999 cath, 2009 NUC stress low risk here with chest pain. Had chest pain left sided last night, called EMS but did not want to go to ER yesterday. Dr. Anna Genre saw today and suggested he go to ER for further evaluation. NTG was given at the office and it helped ease it off. Last night 10/10 30-45 min duration, no radiation, non exertional, felt hot sweats with pain. After bed, went to sleep went away. This AM came back, off and on. Sitting at the house thinking about some problems brought it on.   CATH 1999  - <50% stenosis NUC 2008 - NUC low risk per patient  PMH:   Past Medical History  Diagnosis Date  . Coronary artery disease   . Hypertension   . Diabetes mellitus     PSH:   Past Surgical History  Procedure Date  . Back surgery   . Fracture surgery   . Cholecystectomy    Allergies:  Sulfa antibiotics Prior to Admit Meds: Medications Prior to Admission  Medication Dose Route Frequency Provider Last Rate Last Dose  . aspirin chewable tablet 324 mg  324 mg Oral Once Loren Racer, MD   324 mg at 07/30/11 1821  . nitroGLYCERIN (NITROGLYN) 2 % ointment 1 inch  1 inch Topical Q6H Loren Racer, MD   1 inch at 07/30/11 1822   Medications Prior to Admission  Medication Sig Dispense Refill  . amLODipine (NORVASC) 10 MG tablet Take 10 mg by mouth daily.      . calcium-vitamin D (OSCAL WITH D) 500-200 MG-UNIT per tablet Take 1 tablet by mouth daily.      . carvedilol (COREG) 12.5 MG tablet Take 12.5 mg by mouth 2 (two) times daily with a meal.      . DULoxetine (CYMBALTA) 60 MG capsule Take 60 mg by mouth daily.      Marland Kitchen losartan (COZAAR) 100 MG tablet Take 100 mg by mouth daily.      . metFORMIN (GLUCOPHAGE-XR) 500 MG 24 hr tablet Take 2,000 mg by mouth 2 (two) times daily.      . nitroGLYCERIN (NITROSTAT) 0.4 MG SL  tablet Place 0.4 mg under the tongue every 5 (five) minutes as needed. For chest pain         (Not in a hospital admission) Fam HX:   History reviewed. No pertinent family history. Father - cancer, Mother - living, no early CAD Social HX:   Retired city of [place] History   Social History  . Marital Status: Single    Spouse Name: N/A    Number of Children: N/A  . Years of Education: N/A   Occupational History  . Not on file.   Social History Main Topics  . Smoking status: Never Smoker   . Smokeless tobacco: Not on file  . Alcohol Use: No  . Drug Use:   . Sexually Active:    Other Topics Concern  . Not on file   Social History Narrative  . No narrative on file     ROS:  No fevers, no syncope, no bleeding, no rashs, no dysphagia. +Back pain. 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  All 11 ROS were addressed and are negative except what is stated in the HPI  Physical Exam: Blood pressure 153/79, pulse 66, temperature 97.4  F (36.3 C), temperature source Oral, resp. rate 18, SpO2 97.00%.    General: Well developed, well nourished, in no acute distress Head: Eyes PERRLA, No xanthomas.   Normal cephalic and atramatic  Lungs:   Clear bilaterally to auscultation and percussion. Normal respiratory effort. No wheezes, no rales. Heart:   HRRR S1 S2 Pulses are 2+ & equal. No murmur            No carotid bruit. No JVD.  No abdominal bruits.  Abdomen: Bowel sounds are positive, abdomen soft and non-tender without masses No hepatosplenomegaly. Obese Msk:  Normal strength and tone for age. Extremities:   No clubbing, cyanosis or edema.  DP +1, Left leg brace Neuro: Alert and oriented X 3, non-focal, MAE x 4 GU: Deferred Rectal: Deferred Psych:  Good affect, responds appropriately    Labs:   Lab Results  Component Value Date   WBC 7.1 07/30/2011   HGB 14.6 07/30/2011   HCT 40.7 07/30/2011   MCV 84.4 07/30/2011   PLT 214 07/30/2011    Lab 07/30/11 1737  NA 133*  K 4.1  CL 98  CO2  23  BUN 13  CREATININE 0.73  CALCIUM 9.5  PROT 7.0  BILITOT 0.4  ALKPHOS 47  ALT 20  AST 15  GLUCOSE 121*   No results found for this basename: PTT   No results found for this basename: INR, PROTIME   Lab Results  Component Value Date   CKTOTAL 209 07/30/2011   CKMB 3.5 07/30/2011   TROPONINI <0.30 07/30/2011        Radiology:  Dg Chest 2 View  07/30/2011  *RADIOLOGY REPORT*  Clinical Data: Left side chest pain for 3 days.  CHEST - 2 VIEW  Comparison: PA and lateral chest 10/24/2008.  Findings: Lungs are clear.  Heart size is normal.  No pneumothorax or effusion.  No focal bony abnormality.  IMPRESSION: Negative chest.  Original Report Authenticated By: Bernadene Bell. Maricela Curet, M.D.   Personally viewed.   EKG:  NSR, no ST changes Personally viewed.  ASSESSMENT/PLAN:   66 year old with DM, HTN, obesity here with chest pain.  Chest pain  - will observe overnight  - DM is CAD equivalent  - NTG   - Will start heparin if cardiac markers elevated  - ECG/markers reassuring. Some components of history concerning. Relieved with NTG. Atypical occurring at rest.   - Cath 1999 (no PCI, <50% stenosis per patient), NUC 2008 per patient low risk  - NPO for further testing, discussed possible outpt NUC two day.  DM  - stable  - hold metformin   Obesity  - loose weight  - difficult with leg pain/back pain     Donato Schultz, MD  07/30/2011  7:58 PM

## 2011-07-30 NOTE — ED Provider Notes (Signed)
History     CSN: [id number]  Arrival date & time 07/30/11  1628   First MD Initiated Contact with Patient 07/30/11 1714      Chief Complaint  Patient presents with  . Chest Pain    (Consider location/radiation/quality/duration/timing/severity/associated sxs/prior treatment) HPI Pt with episodic palpitations for the last few day began having 10/10 chest pressure last night in left chest. No radiation, N/V, SOB. EMS called and performed EKG with no acute findings. Pt refused transport to ED. Saw PMD today and was given NTG in office with some relief. Told to come to ED for eval. Pt drove himself here. Has had 81 mg ASA today. Last cardiac cath 1999. CP currently 5/10 Past Medical History  Diagnosis Date  . Coronary artery disease   . Hypertension   . Diabetes mellitus     Past Surgical History  Procedure Date  . Back surgery   . Fracture surgery   . Cholecystectomy     History reviewed. No pertinent family history.  History  Substance Use Topics  . Smoking status: Never Smoker   . Smokeless tobacco: Not on file  . Alcohol Use: No      Review of Systems  Constitutional: Positive for diaphoresis. Negative for fever and chills.  Respiratory: Negative for cough, shortness of breath and wheezing.   Cardiovascular: Positive for chest pain and palpitations. Negative for leg swelling.  Gastrointestinal: Negative for nausea, vomiting and abdominal pain.  Musculoskeletal: Negative for back pain.  Skin: Negative for pallor, rash and wound.  Neurological: Negative for dizziness, weakness, numbness and headaches.    Allergies  Sulfa antibiotics  Home Medications   Current Outpatient Rx  Name Route Sig Dispense Refill  . AMLODIPINE BESYLATE 10 MG PO TABS Oral Take 10 mg by mouth daily.    . ASPIRIN EC 81 MG PO TBEC Oral Take 81 mg by mouth daily.    Marland Kitchen CALCIUM CARBONATE-VITAMIN D 500-200 MG-UNIT PO TABS Oral Take 1 tablet by mouth daily.    Marland Kitchen CARVEDILOL 12.5 MG PO TABS  Oral Take 12.5 mg by mouth 2 (two) times daily with a meal.    . DULOXETINE HCL 60 MG PO CPEP Oral Take 60 mg by mouth daily.    Marland Kitchen LOSARTAN POTASSIUM 100 MG PO TABS Oral Take 100 mg by mouth daily.    Marland Kitchen METFORMIN HCL ER 500 MG PO TB24 Oral Take 2,000 mg by mouth 2 (two) times daily.    Marland Kitchen NABUMETONE 750 MG PO TABS Oral Take 750 mg by mouth 2 (two) times daily.    Marland Kitchen NITROGLYCERIN 0.4 MG SL SUBL Sublingual Place 0.4 mg under the tongue every 5 (five) minutes as needed. For chest pain      BP 153/79  Pulse 66  Temp(Src) 97.4 F (36.3 C) (Oral)  Resp 18  SpO2 97%  Physical Exam  Nursing note and vitals reviewed. Constitutional: He is oriented to person, place, and time. He appears well-developed and well-nourished. No distress.  HENT:  Head: Normocephalic and atraumatic.  Mouth/Throat: Oropharynx is clear and moist.  Eyes: EOM are normal. Pupils are equal, round, and reactive to light.  Neck: Normal range of motion. Neck supple.  Cardiovascular: Normal rate and regular rhythm.  Exam reveals no gallop and no friction rub.   No murmur heard. Pulmonary/Chest: Effort normal and breath sounds normal. No respiratory distress. He has no wheezes. He has no rales.  Abdominal: Soft. Bowel sounds are normal. There is no tenderness. There is  no rebound and no guarding.  Musculoskeletal: Normal range of motion. He exhibits no edema and no tenderness.       No calf tenderness or swelling  Neurological: He is alert and oriented to person, place, and time.  Skin: Skin is warm and dry. No rash noted. No erythema.  Psychiatric: He has a normal mood and affect. His behavior is normal.    ED Course  Procedures (including critical care time)  Labs Reviewed  COMPREHENSIVE METABOLIC PANEL - Abnormal; Notable for the following:    Sodium 133 (*)    Glucose, Bld 121 (*)    All other components within normal limits  CBC  DIFFERENTIAL  CARDIAC PANEL(CRET KIN+CKTOT+MB+TROPI)   Dg Chest 2  View  07/30/2011  *RADIOLOGY REPORT*  Clinical Data: Left side chest pain for 3 days.  CHEST - 2 VIEW  Comparison: PA and lateral chest 10/24/2008.  Findings: Lungs are clear.  Heart size is normal.  No pneumothorax or effusion.  No focal bony abnormality.  IMPRESSION: Negative chest.  Original Report Authenticated By: Bernadene Bell. D'ALESSIO, M.D.     1. Chest pain      Date: 07/30/2011  Rate: 74  Rhythm: normal sinus rhythm  QRS Axis: normal  Intervals: normal  ST/T Wave abnormalities: normal  Conduction Disutrbances:none  Narrative Interpretation:   Old EKG Reviewed: unchanged    MDM  Chest pressure improved to 2/10. Discussed with Dr Anne Fu who will see in ED and admit for r/o and provocative testing        Loren Racer, MD 07/30/11 1944

## 2011-07-30 NOTE — ED Notes (Signed)
To ED for eval of cp. Sent to ED by PMD. Pt states he has been taking nitro with relief. Denies nausea and vomiting, but c/o diaphoresis. Describes pain as left sided and pressure. Pain started yesterday afternoon. Seen by EMS at that time but didn't want to be transported. Pain continued today so he went to pmd and then here. Last nitro taken approx 2 hrs ago.

## 2011-07-30 NOTE — ED Notes (Signed)
Pt informed this RN and the MD that he had a heart cath in 1999, which revealed an artery blockage of less than 50%.  Pt states he has had a stress test done in the past 2-3 years.

## 2011-07-30 NOTE — ED Notes (Signed)
Pt moved to room, changed into gown and put on monitor.  Vitals reassessed and pt resting comfortably at this time.  MD at bedside

## 2011-07-30 NOTE — ED Notes (Signed)
2004-01 Ready 

## 2011-07-31 LAB — CBC
Platelets: 200 10*3/uL (ref 150–400)
RBC: 4.81 MIL/uL (ref 4.22–5.81)
RDW: 13.2 % (ref 11.5–15.5)
WBC: 6.4 10*3/uL (ref 4.0–10.5)

## 2011-07-31 LAB — HEMOGLOBIN A1C
Hgb A1c MFr Bld: 6.8 % — ABNORMAL HIGH (ref <5.7)
Mean Plasma Glucose: 148 mg/dL — ABNORMAL HIGH (ref <117)

## 2011-07-31 LAB — LIPID PANEL
Total CHOL/HDL Ratio: 4.6 RATIO
VLDL: 32 mg/dL (ref 0–40)

## 2011-07-31 LAB — BASIC METABOLIC PANEL
CO2: 27 mEq/L (ref 19–32)
Chloride: 100 mEq/L (ref 96–112)
Creatinine, Ser: 0.9 mg/dL (ref 0.50–1.35)
GFR calc Af Amer: >90 mL/min (ref >90)
Potassium: 4.1 mEq/L (ref 3.5–5.1)
Sodium: 136 mEq/L (ref 135–145)

## 2011-07-31 LAB — CARDIAC PANEL(CRET KIN+CKTOT+MB+TROPI): Troponin I: <0.3 ng/mL (ref <0.30)

## 2011-07-31 NOTE — Discharge Instructions (Signed)
Return if chest pain recurs or have to use multiple NTG.

## 2011-07-31 NOTE — Progress Notes (Signed)
UR Completed. Simmons, Dyanna Seiter F [phone]  

## 2011-07-31 NOTE — Progress Notes (Signed)
Pt discharged to home pt stable all instructions given to pt Dr Katrinka Blazing in to see pt pt instructed to follow up with Dr Katrinka Blazing outpatient.

## 2011-07-31 NOTE — Discharge Summary (Addendum)
Patient ID: [name] MRN: [id number] DOB/AGE: 1945-08-29 66 y.o.  Admit date: 07/30/2011 Discharge date: 07/31/2011  Primary Discharge Diagnosis : Chest pain with MI ruled out  Secondary Discharge Diagnosis: 1. Diabetes mellitus 2. Non obstructive CAD 3. Hypertension  Significant Diagnostic Studies: none  Consults: None  Hospital Course:  Admitted for atypical chest pain with 2 components. One is localized to left breast area. This area is tender. The other was a sensation of pressure similar to prior pain that was evalauated by cath 1999(non obstructive disease) and nuclear 2008(no ischemia). Markers and ECG's are negative.   Discharge Exam: Blood pressure 156/83, pulse 53, temperature 97 F (36.1 C), temperature source Oral, resp. rate 20, height 5\' 9"  (1.753 m), weight 121.7 kg (268 lb 4.8 oz), SpO2 97.00%.   Chest clear. Palpable left lateral chest wall tenderness. No rash to suggest shingles. Labs:   Lab Results  Component Value Date   WBC 6.4 07/31/2011   HGB 14.2 07/31/2011   HCT 41.0 07/31/2011   MCV 85.2 07/31/2011   PLT 200 07/31/2011     Lab 07/31/11 0352 07/30/11 2204  NA 136 --  K 4.1 --  CL 100 --  CO2 27 --  BUN 12 --  CREATININE 0.90 --  CALCIUM 9.4 --  PROT -- 7.6  BILITOT -- 0.4  ALKPHOS -- 48  ALT -- 20  AST -- 15  GLUCOSE 134* --   Lab Results  Component Value Date   CKTOTAL 164 07/31/2011   CKMB 2.9 07/31/2011   TROPONINI <0.30 07/31/2011    Lab Results  Component Value Date   CHOL 175 07/31/2011   Lab Results  Component Value Date   HDL 38* 07/31/2011   Lab Results  Component Value Date   LDLCALC 105* 07/31/2011   Lab Results  Component Value Date   TRIG 161* 07/31/2011   Lab Results  Component Value Date   CHOLHDL 4.6 07/31/2011   No results found for this basename: LDLDIRECT      Radiology: NAD  EKG: normal  FOLLOW UP PLANS AND APPOINTMENTS  Medication List  As of 07/31/2011  9:25 AM   TAKE these medications         amLODipine 10 MG tablet   Commonly known as: NORVASC   Take 10 mg by mouth daily.      aspirin EC 81 MG tablet   Take 81 mg by mouth daily.      calcium-vitamin D 500-200 MG-UNIT per tablet   Commonly known as: OSCAL WITH D   Take 1 tablet by mouth daily.      carvedilol 12.5 MG tablet   Commonly known as: COREG   Take 12.5 mg by mouth 2 (two) times daily with a meal.      DULoxetine 60 MG capsule   Commonly known as: CYMBALTA   Take 60 mg by mouth daily.      losartan 100 MG tablet   Commonly known as: COZAAR   Take 100 mg by mouth daily.      metFORMIN 500 MG 24 hr tablet   Commonly known as: GLUCOPHAGE-XR   Take 2,000 mg by mouth 2 (two) times daily.      nabumetone 750 MG tablet   Commonly known as: RELAFEN   Take 750 mg by mouth 2 (two) times daily.      nitroGLYCERIN 0.4 MG SL tablet   Commonly known as: NITROSTAT   Place 0.4 mg under the tongue  every 5 (five) minutes as needed. For chest pain           Follow-up Information    Follow up with Lesleigh Noe, MD on 08/06/2011. (Cardiolite stress test. Office will call with details.)    Contact information:   [address] [place] [address] [place] [address] [phone]          BRING ALL MEDICATIONS WITH YOU TO FOLLOW UP APPOINTMENTS  Time spent with patient to include physician time: 30 min. Signed: Lesleigh Noe 07/31/2011, 9:25 AM

## 2011-08-13 ENCOUNTER — Other Ambulatory Visit: Payer: Self-pay | Admitting: Interventional Cardiology

## 2011-08-13 ENCOUNTER — Encounter (HOSPITAL_COMMUNITY): Payer: Self-pay | Admitting: Pharmacy Technician

## 2011-08-19 ENCOUNTER — Other Ambulatory Visit: Payer: Self-pay | Admitting: Cardiology

## 2011-08-19 NOTE — H&P (Signed)
PRE CATH WORK UP/HS/LABS/DR SMITH TO TALK WITH PT ALSO/SEE AMY FOR INSTRUCTIONS.      HPI:     General:           [name] is a 66 yo male recently seen by Dr Katrinka Blazing with hx of Diabetes, Hypertension, with admission 07/31/11 due to chest pain with negative cardiac enzymes. 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 08/07/11--Out patient nuclear stress test performed with moderately reversible anteroapical defect, EF 63%, no wall motion abnormality. He continues to have chest pressure especially in am, last 30 minutes to 1 hr, has not taken NTG. He notices it more at rest. radiates into left shouldeer, and back. no GI complaints, Does not occur with exertion, .         Patient denies dizziness, syncope, swelling, nor PND. Occasional flutterying, Has dyspnea if working in yard, non today with walking in office.     ROS:      as noted in HPI, no IVP dye allergies, no neurological changes, chronic back and leg pain. no fever, chills, cough nor congestion, no falls.     Medical History: Hypertension, Hx of depression, Hearing loss, Hx of kidney stones, 07/31/11 nuclear stress test with anteroapical defect, EF 63%, Diabetes, 1999 cardiac cath with 50% nonflow limiting CAD, 2008 nuclear stress test low risk.      Surgical History: shoulder surgery 04/2009, back surgery x3 04/2008, cervical spine surgery 2008, cholecystectomy .      Hospitalization/Major Diagnostic Procedure: chest pain, negative enzymes 07/31/11.      Family History:  Father: colon cancer Mother: alive no CAD      Social History:      General: no Smoking, chews. no Alcohol. Occupation: retired from [employer].      Medications: Citalopram Hydrobromide 40 MG Tablet 1 tablet Once a day, Losartan Potassium 100 MG Tablet 1 tablet Once a day, Amlodipine Besylate 5 MG  Tablet 1 tablet Once a day, Labetalol HCl 200 MG Tablet 1 tablet Twice a day, Vitamin D 3 50,000 units pill 1 pill once a week, Aspirin 81 MG Tablet Chewable 1 tablet Once a day, metformin 500 mg tab 2 tablets twice a day, Nitroglycerin 0.4 mg 0.4 mg tablet 1 tablet as directed as directed prn chest pain, Medication List reviewed and reconciled with the patient     Allergies: Sulfa Drugs: Rash.      Objective:    Vitals: Wt 287, Wt change -4 lb, Ht 71, BMI 40.02, Pulse sitting 76, BP sitting 160/86.     Examination:     Cardiology, General:         GENERAL APPEARANCE: pleasant, NAD.  HEENT: unremarkable.  CAROTID UPSTROKE: normal, no bruit.  JVD: flat.  HEART SOUNDS: regular, normal S1, S2, no S3 or S4.  MURMUR: absent.  LUNGS: no rales or wheezes.  ABDOMEN: soft, non tender, positive bowel sounds, no masses felt.  EXTREMITIES: no leg edema.  PERIPHERAL PULSES: 2 plus bilateral.          General:      Admission records reviewed from 07/31/11 with BMET, enzymes, CBC and CXR stable        Allen's test normal.      Assessment:    Assessment:  1. Abnormal nuclear cardiac imaging test - 794.39 (Primary)  2. Hypertension - 401.9   3. Diabetes mellitus - 250.00   4. Chest pain - 786.50     Plan:    1. Abnormal nuclear cardiac imaging test        LAB: CBC with Diff     WBC 8.0 4.0-11.0 - K/ul        RBC 4.99 4.20-5.80 - M/uL        HGB 15.1 13.0-17.0 - g/dL        HCT 16.1 09.6-04.5 - %        MCH 30.3 27.0-33.0 - pg        MPV 8.4 7.5-10.7 - fL        MCV 88.6 80.0-94.0 - fL        MCHC 34.2 32.0-36.0 - g/dL        RDW 40.9 81.1-91.4 - %        NRBC# 0.01 -        PLT 228 150-400 - K/uL        NEUT % 63.3 43.3-71.9 - %        NRBC% 0.10 - %        LYMPH% 24.5 16.8-43.5 - %        MONO % 6.8 4.6-12.4 - %        EOS % 4.6 0.0-7.8 - %        BASO % 0.8 0.0-1.0 - %        NEUT # 5.1 1.9-7.2 - K/uL        LYMPH# 2.00 1.10-2.70 - K/uL        MONO # 0.5 0.3-0.8 - K/uL        EOS #  0.4 0.0-0.6 - K/uL        BASO # 0.1 0.0-0.1 - K/uL               Robben Jagiello A 08/18/2011 11:51:04 AM > ok for cath        LAB: Comp Metabolic Panel      GLUCOSE 167 70-99 - mg/dL H       BUN 14 7-82 - mg/dL        CREATININE 9.56 0.60-1.30 - mg/dl        eGFR (NON-AFRICAN AMERICAN) 102 >60 - calc        eGFR (AFRICAN AMERICAN) 123 >60 - calc         SODIUM 135 136-145 - mmol/L L       POTASSIUM 4.5 3.5-5.5 - mmol/L        CHLORIDE 102 98-107 - mmol/L        C02 25 22-32 - mg/dL        ANION GAP 21.3 0.8-65.7 - mmol/L        CALCIUM 10.0 8.6-10.3 - mg/dL        T PROTEIN 7.2 8.4-6.9 - g/dL        ALBUMIN 4.8 6.2-9.5 - g/dL        T.BILI 0.4 0.3-1.0 - mg/dL        ALP 45 28-413 - U/L        AST 11 0-39 - U/L        ALT 18 0-52 - U/L               Demarus Latterell A 08/18/2011 12:34:32 PM > ok for cath        LAB: PT and PTT (244010)     aPTT 28 24-33 - SEC  INR 1.1 0.8-1.2 -        Prothrombin Time 11.3 9.1-12.0 - SEC               Henritta Mutz A 08/19/2011 08:24:44 AM > ok for cath   Dr Katrinka Blazing in with proceed also reviewed again with pt. Risks and benefits of cardiac catheterization have been discussed including risk of stroke, heart attack, death, bleeding, renal impariment and arterial damage. There was ample oppurtuny to answer questions. Alternatives were discussed. Patient understands and wishes to proceed. at the main lab with plans to proceed if possible by right radial site.       2. Hypertension  Continue Losartan Potassium Tablet, 100 MG, 1 tablet, Orally, Once a day ;  Continue Amlodipine Besylate Tablet, 5 MG, 1 tablet, Orally, Once a day ;  Continue Labetalol HCl Tablet, 200 MG, 1 tablet, Orally, Twice a day .       3. Diabetes mellitus  Continue metformin tab, 500 mg, 2 tablets, po, twice a day .    he is aware to hold the Metformin the morning of procedure.       4. Chest pain  Continue Aspirin Tablet Chewable, 81 MG, 1 tablet, Orally, Once a day ;   Continue Nitroglycerin 0.4 mg tablet, 0.4 mg, 1 tablet as directed, SL, as directed prn chest pain .          Immunizations:       Labs:      Procedure Codes: 56213 ECL CBC PLATELET DIFF, 80053 ECL COMP METABOLIC PANEL, 08657 BLOOD COLLECTION ROUTINE VENIPUNCTURE     Preventive:           Follow Up: HS pending cath (Reason: chest pain)        Provider: Michaell Cowing. Emelda Fear, NP

## 2011-08-21 ENCOUNTER — Ambulatory Visit (HOSPITAL_COMMUNITY)
Admission: RE | Admit: 2011-08-21 | Discharge: 2011-08-22 | Disposition: A | Discharge status: Home / Self Care | Payer: 59 | Source: Ambulatory Visit | Attending: Interventional Cardiology | Admitting: Interventional Cardiology

## 2011-08-21 ENCOUNTER — Encounter (HOSPITAL_COMMUNITY): Payer: Self-pay | Admitting: General Practice

## 2011-08-21 ENCOUNTER — Encounter (HOSPITAL_COMMUNITY)
Admission: RE | Disposition: A | Discharge status: Home / Self Care | Payer: Self-pay | Source: Ambulatory Visit | Attending: Interventional Cardiology

## 2011-08-21 DIAGNOSIS — I2510 Atherosclerotic heart disease of native coronary artery without angina pectoris: Secondary | ICD-10-CM | POA: Insufficient documentation

## 2011-08-21 DIAGNOSIS — I200 Unstable angina: Secondary | ICD-10-CM

## 2011-08-21 DIAGNOSIS — I10 Essential (primary) hypertension: Secondary | ICD-10-CM | POA: Insufficient documentation

## 2011-08-21 DIAGNOSIS — Z955 Presence of coronary angioplasty implant and graft: Secondary | ICD-10-CM

## 2011-08-21 DIAGNOSIS — E785 Hyperlipidemia, unspecified: Secondary | ICD-10-CM | POA: Insufficient documentation

## 2011-08-21 HISTORY — PX: LEFT HEART CATHETERIZATION WITH CORONARY ANGIOGRAM: SHX5451

## 2011-08-21 LAB — GLUCOSE, CAPILLARY
Glucose-Capillary: 156 mg/dL — ABNORMAL HIGH (ref 70–99)
Glucose-Capillary: 110 mg/dL — ABNORMAL HIGH (ref 70–99)

## 2011-08-21 SURGERY — LEFT HEART CATHETERIZATION WITH CORONARY ANGIOGRAM
Anesthesia: LOCAL

## 2011-08-21 MED ORDER — CLOPIDOGREL BISULFATE 75 MG PO TABS
75.0000 mg | ORAL_TABLET | Freq: Every day | ORAL | Status: DC
  Administered 2011-08-22: 75 mg via ORAL
  Filled 2011-08-21: qty 1

## 2011-08-21 MED ORDER — ALPRAZOLAM 0.25 MG PO TABS: 0.2500 mg | ORAL_TABLET | Freq: Two times a day (BID) | ORAL | Status: DC | PRN

## 2011-08-21 MED ORDER — METFORMIN HCL 500 MG PO TABS
2000.0000 mg | ORAL_TABLET | Freq: Two times a day (BID) | ORAL | Status: DC
  Filled 2011-08-21: qty 4

## 2011-08-21 MED ORDER — SODIUM CHLORIDE 0.9 % IV SOLN
INTRAVENOUS | Status: AC
  Administered 2011-08-21: 12:00:00 via INTRAVENOUS

## 2011-08-21 MED ORDER — LOSARTAN POTASSIUM 50 MG PO TABS
100.0000 mg | ORAL_TABLET | Freq: Every day | ORAL | Status: DC
  Administered 2011-08-22: 100 mg via ORAL
  Filled 2011-08-21: qty 2

## 2011-08-21 MED ORDER — HEPARIN (PORCINE) IN NACL 2-0.9 UNIT/ML-% IJ SOLN
INTRAMUSCULAR | Status: AC
  Filled 2011-08-21: qty 2000

## 2011-08-21 MED ORDER — OXYCODONE-ACETAMINOPHEN 5-325 MG PO TABS: 1.0000 | ORAL_TABLET | ORAL | Status: DC | PRN

## 2011-08-21 MED ORDER — NABUMETONE 750 MG PO TABS
750.0000 mg | ORAL_TABLET | Freq: Two times a day (BID) | ORAL | Status: DC
  Administered 2011-08-22: 750 mg via ORAL
  Filled 2011-08-21 (×4): qty 1

## 2011-08-21 MED ORDER — VITAMIN D3 25 MCG (1000 UNIT) PO TABS
1000.0000 [IU] | ORAL_TABLET | Freq: Every day | ORAL | Status: DC
  Administered 2011-08-22: 1000 [IU] via ORAL
  Filled 2011-08-21 (×2): qty 1

## 2011-08-21 MED ORDER — SODIUM CHLORIDE 0.9 % IJ SOLN: 3.0000 mL | INTRAMUSCULAR | Status: DC | PRN

## 2011-08-21 MED ORDER — MIDAZOLAM HCL 2 MG/2ML IJ SOLN
INTRAMUSCULAR | Status: AC
  Filled 2011-08-21: qty 2

## 2011-08-21 MED ORDER — HYDROMORPHONE HCL PF 2 MG/ML IJ SOLN
INTRAMUSCULAR | Status: AC
  Filled 2011-08-21: qty 1

## 2011-08-21 MED ORDER — FENTANYL CITRATE 0.05 MG/ML IJ SOLN
INTRAMUSCULAR | Status: AC
  Filled 2011-08-21: qty 2

## 2011-08-21 MED ORDER — ATORVASTATIN CALCIUM 40 MG PO TABS
40.0000 mg | ORAL_TABLET | Freq: Every day | ORAL | Status: DC
  Filled 2011-08-21 (×2): qty 1

## 2011-08-21 MED ORDER — CLOPIDOGREL BISULFATE 300 MG PO TABS
ORAL_TABLET | ORAL | Status: AC
  Filled 2011-08-21: qty 2

## 2011-08-21 MED ORDER — ONDANSETRON HCL 4 MG/2ML IJ SOLN
INTRAMUSCULAR | Status: AC
  Filled 2011-08-21: qty 2

## 2011-08-21 MED ORDER — BIVALIRUDIN 250 MG IV SOLR
INTRAVENOUS | Status: AC
  Filled 2011-08-21: qty 250

## 2011-08-21 MED ORDER — SODIUM CHLORIDE 0.9 % IV SOLN: 250.0000 mL | INTRAVENOUS | Status: DC | PRN

## 2011-08-21 MED ORDER — ONDANSETRON HCL 4 MG/2ML IJ SOLN: 4.0000 mg | Freq: Four times a day (QID) | INTRAMUSCULAR | Status: DC | PRN

## 2011-08-21 MED ORDER — AMLODIPINE BESYLATE 10 MG PO TABS
10.0000 mg | ORAL_TABLET | Freq: Every day | ORAL | Status: DC
  Administered 2011-08-22: 10 mg via ORAL
  Filled 2011-08-21: qty 1

## 2011-08-21 MED ORDER — NITROGLYCERIN 0.2 MG/ML ON CALL CATH LAB
INTRAVENOUS | Status: AC
  Filled 2011-08-21: qty 1

## 2011-08-21 MED ORDER — ACETAMINOPHEN 325 MG PO TABS
650.0000 mg | ORAL_TABLET | ORAL | Status: DC | PRN
  Administered 2011-08-21: 650 mg via ORAL
  Filled 2011-08-21 (×2): qty 2

## 2011-08-21 MED ORDER — DIAZEPAM 5 MG PO TABS
5.0000 mg | ORAL_TABLET | ORAL | Status: AC
  Administered 2011-08-21: 5 mg via ORAL
  Filled 2011-08-21: qty 1

## 2011-08-21 MED ORDER — ASPIRIN EC 325 MG PO TBEC
325.0000 mg | DELAYED_RELEASE_TABLET | Freq: Every day | ORAL | Status: DC
  Administered 2011-08-22: 325 mg via ORAL
  Filled 2011-08-21: qty 1

## 2011-08-21 MED ORDER — CARVEDILOL 12.5 MG PO TABS
12.5000 mg | ORAL_TABLET | Freq: Two times a day (BID) | ORAL | Status: DC
  Administered 2011-08-21 – 2011-08-22 (×2): 12.5 mg via ORAL
  Filled 2011-08-21 (×4): qty 1

## 2011-08-21 MED ORDER — ASPIRIN 81 MG PO CHEW
324.0000 mg | CHEWABLE_TABLET | ORAL | Status: AC
  Administered 2011-08-21: 324 mg via ORAL
  Filled 2011-08-21: qty 4

## 2011-08-21 MED ORDER — SODIUM CHLORIDE 0.9 % IV SOLN
1.7500 mg/kg/h | INTRAVENOUS | Status: DC
  Administered 2011-08-21: 1.75 mg/kg/h via INTRAVENOUS
  Filled 2011-08-21: qty 250

## 2011-08-21 MED ORDER — SODIUM CHLORIDE 0.9 % IV SOLN: INTRAVENOUS | Status: DC

## 2011-08-21 MED ORDER — METOPROLOL TARTRATE 25 MG PO TABS: 25.0000 mg | ORAL_TABLET | Freq: Two times a day (BID) | ORAL | Status: DC

## 2011-08-21 MED ORDER — NITROGLYCERIN IN D5W 200-5 MCG/ML-% IV SOLN
10.0000 ug/min | INTRAVENOUS | Status: AC
  Administered 2011-08-21: 10 ug/min via INTRAVENOUS

## 2011-08-21 MED ORDER — VITAMIN D3 25 MCG (1000 UT) PO CAPS
1.0000 | ORAL_CAPSULE | Freq: Every day | ORAL | Status: DC
Start: 2011-08-21 — End: 2011-08-21
  Filled 2011-08-21: qty 1

## 2011-08-21 MED ORDER — NITROGLYCERIN 0.4 MG SL SUBL: 0.4000 mg | SUBLINGUAL_TABLET | SUBLINGUAL | Status: DC | PRN

## 2011-08-21 MED ORDER — SODIUM CHLORIDE 0.9 % IJ SOLN
3.0000 mL | Freq: Two times a day (BID) | INTRAMUSCULAR | Status: DC
  Administered 2011-08-21: 3 mL via INTRAVENOUS

## 2011-08-21 MED ORDER — DULOXETINE HCL 60 MG PO CPEP
60.0000 mg | ORAL_CAPSULE | Freq: Every day | ORAL | Status: DC
  Administered 2011-08-22: 60 mg via ORAL
  Filled 2011-08-21 (×2): qty 1

## 2011-08-21 MED ORDER — ONDANSETRON HCL 4 MG/2ML IJ SOLN
4.0000 mg | Freq: Three times a day (TID) | INTRAMUSCULAR | Status: DC | PRN
  Administered 2011-08-21: 4 mg via INTRAVENOUS

## 2011-08-21 MED ORDER — LIDOCAINE HCL (PF) 1 % IJ SOLN
INTRAMUSCULAR | Status: AC
  Filled 2011-08-21: qty 30

## 2011-08-21 MED ORDER — HEPARIN SODIUM (PORCINE) 1000 UNIT/ML IJ SOLN
INTRAMUSCULAR | Status: AC
  Filled 2011-08-21: qty 1

## 2011-08-21 NOTE — CV Procedure (Signed)
Diagnostic Cardiac Catheterization Report  [name]  66 y.o.  male 01/10/1946  Procedure Date: 08/21/2011  Referring Physician:  Dr. Anna Genre, [facility]  Primary Cardiologist:: Wayne Both, III, MD   PROCEDURE:  Left heart catheterization with selective coronary angiography, left ventriculogram.  INDICATIONS:  Atypical recurrent chest pain over the past 2 months. Myocardial infarction recently moved out. Recent outpatient nuclear study demonstrating anteroapical ischemia cardiac catheterization is being performed to therapy and to confirm the diagnosis of coronary disease in this patient.  The risks, benefits, and details of the procedure were explained to the patient.  The patient verbalized understanding and wanted to proceed.  Informed written consent was obtained.  PROCEDURE TECHNIQUE:  After Xylocaine anesthesia a 6F sheath was placed in the right radial artery with a single anterior needle wall stick.   Coronary angiography was done using a 6F A2 MP catheter.  Left ventriculography was done using a 6F A2 MP catheter. 5000 units of heparin was given after sheath insertion  After reviewing the diagnostic images were decided to proceed with PCI. 600 mg of Plavix was administered, an Angiomax bolus and infusion was started. ACT was documented to be greater than 300 sec.  The first obtuse marginal branch had a proximal 99% stenosis with TIMI grade 2 flow. We upgraded to a 6 French sheath in the right radial and used an XB LAD 3 cm 6 French left coronary guide catheter. We then attempted to treat the obtuse marginal lesion unable to successfully cross the lesion. He became very uncomfortable and tended to move around on the table and to hyperventilate. The estimated working in the circumflex territory very difficult. With each movement the catheter position change and he had difficulty with support to cross the lesion. After several wires, and using the balloon as prior to cross the  stenosis, I would benefit further attempts to treat the obtuse marginal. We also noted that with her ride tip across the stenosis there is retrograde flow into the distal vessel due to collaterals.  Then turned our attention to the LAD mid lesion which is actually the culprit for the abnormal nuclear study . Necrosis stenosis with a BMW wire. We predilated with a 2.5 x 12 mm long Sprinter. We then positioned and deployed  a 16 mm long by 2.5 mm Promus element drug-eluting stent. Post dilated with a 2.5 x 12 mm long [place] balloon to 14 atmospheres. A nice angiographic result was obtained. TIMI grade 3 flow was noted.  The procedure this may very difficult by inability to adequately sedate the patient (Versed 5 mg IV, 1 mg of IV Dilaudid, 150 mcg of fentanyl). This was compounded by the patient's tendency to have anxiety and hyperventilate. This was further aggravated by musculoskeletal pain and claustrophobia.   CONTRAST:  Total of 200 cc.  COMPLICATIONS:  None.    HEMODYNAMICS:  Aortic pressure was 140/88 mm mercury; LV pressure was 144/12 mmHg; LVEDP 14 mm mercury.  There was no gradient between the left ventricle and aorta.    ANGIOGRAPHIC DATA:   The left main coronary artery is widely patent.  The left anterior descending artery is transapical. There is diffuse disease from the proximal to the distal vessel. 2 diagonal branches arise from the LAD. Proximal to the first diagonal there is eccentric 50% LAD stenosis. 30-60% stenosis is noted throughout the mid segment. There is a high-grade focal 95% stenosis in the distal portion of the mid segment. This  lesion would account for the patient's apical ischemia on nuclear scintigraphy..  The left circumflex artery is moderate in size and 2 marginal branches are noted. The first obtuse marginal is the smaller of the 2 branches. There is a proximal focal 99% stenosis. There is TIMI grade 2 flow noted.  The right coronary artery is dominant. The PDA  distal segment contains 95% stenosis. Mid vessel calcification is noted with eccentric 70% narrowing.Marland Kitchen  LEFT VENTRICULOGRAM:  Left ventricular angiogram was done in the 30 RAO projection and revealed normal left ventricular wall motion and systolic function with an estimated ejection fraction of 70 %.    PERCUTANEOUS CORONARY INVENTION:  The 95% mid LAD stenosis was reduced to 0% with TIMI grade 3 flow after DES implantation.  The first marginal was unsuccessfully treated. This vessel is collateralized as noted when the guidewire was across the lesion.    IMPRESSIONS:  1. Multivessel coronary disease with high-grade distal LAD obstruction, moderate proximal LAD obstruction, severe first obtuse marginal stenosis, moderate mid RCA stenosis, and distal PDA disease.  2. Apical ischemia due to the high-grade mid LAD stenosis.  3. Successful DES mid LAD from 99% to 0% with TIMI grade 3 flow.  4. Unsuccessful attempt at obtuse marginal #1 PCI D2 medical issues. If this vessel/lesion is reattempted, we probably have better success going from the femoral approach or left radial approach.  5. Normal LV function   RECOMMENDATION:  1. Medical therapy  2. If attempts at obtuse marginal #1 PCI occur in the future, I would recommend attempting it from the femoral approach.

## 2011-08-21 NOTE — H&P (Signed)
History and physical to the office on 08/19/2011. The patient was admitted to the hospital and in April. He subsequently for myocardial infarction and had an outpatient myocardial perfusion study performed on 08/07/2011 study demonstrated moderate reversibility in the anteroapical region. EF was 63% . The discomfort has been random in occurrence and not responsive to nitroglycerin. There is no exertional component. It appears that these symptoms are progressive. Coronary angiography is being performed to define coronary anatomy and help guide therapy. Discussed the procedure is recommended from the radial approach. We discussed potential complications including death, myocardial infarction, and stroke. We discussed the possibility of renal impairment, bleeding, emergency surgery, limb ischemia, allergy, among others. His examination is unchanged.

## 2011-08-21 NOTE — Progress Notes (Signed)
TR BAND REMOVAL  LOCATION:  right radial  DEFLATED PER PROTOCOL:  yes  TIME BAND OFF / DRESSING APPLIED:   1615   SITE UPON ARRIVAL:   Level 0  SITE AFTER BAND REMOVAL:  Level 0  REVERSE ALLEN'S TEST:    positive  CIRCULATION SENSATION AND MOVEMENT:  Within Normal Limits  yes  COMMENTS:    

## 2011-08-21 NOTE — Progress Notes (Signed)
0740 complains of left chest ache 5/10.  Denies shortness of breath or nausea.  Skin warm and dry.  Placed on oxygen at 2 liters per nasal canula.  EKG shows NSR.  Gretchen from cath lab advised. 1610 states pain is now 3/10.

## 2011-08-22 LAB — CBC
HCT: 38.9 % — ABNORMAL LOW (ref 39.0–52.0)
MCH: 30 pg (ref 26.0–34.0)
MCHC: 35.2 g/dL (ref 30.0–36.0)
MCV: 85.3 fL (ref 78.0–100.0)
Platelets: 190 10*3/uL (ref 150–400)
RDW: 13.2 % (ref 11.5–15.5)

## 2011-08-22 LAB — BASIC METABOLIC PANEL
BUN: 14 mg/dL (ref 6–23)
Calcium: 9.1 mg/dL (ref 8.4–10.5)
Creatinine, Ser: 0.78 mg/dL (ref 0.50–1.35)
GFR calc Af Amer: >90 mL/min (ref >90)

## 2011-08-22 MED ORDER — ISOSORBIDE MONONITRATE ER 60 MG PO TB24: 60.0000 mg | ORAL_TABLET | Freq: Every day | ORAL | Status: DC

## 2011-08-22 MED ORDER — CLOPIDOGREL BISULFATE 75 MG PO TABS: 75.0000 mg | ORAL_TABLET | Freq: Every day | ORAL | Status: DC

## 2011-08-22 MED ORDER — ASPIRIN 325 MG PO TBEC: 325.0000 mg | DELAYED_RELEASE_TABLET | Freq: Every day | ORAL | Status: AC

## 2011-08-22 MED FILL — Nicardipine HCl IV Soln 2.5 MG/ML: INTRAVENOUS | Qty: 1 | Status: AC

## 2011-08-22 MED FILL — Dextrose Inj 5%: INTRAVENOUS | Qty: 50 | Status: AC

## 2011-08-22 NOTE — Care Management Note (Signed)
    Page 1 of 1   08/22/2011     9:58:48 AM   CARE MANAGEMENT NOTE 08/22/2011  Patient:  [name], [name]   Account Number:  [id number]  Date Initiated:  08/22/2011  Documentation initiated by:  Verdis Prime  Subjective/Objective Assessment:   66 yr-old male adm for cardiac cath     Anticipated DC Plan:  HOME/SELF CARE      DC Planning Services  CM consult  Medication Assistance      Discharge Disposition:  HOME/SELF CARE  Comments:  08/22/11 0900 Carlton Buskey RN MSN CCM Pt to be d/c'd on Plavix.  Benefits check reveals copay of $10 for 30-day supply, $20 copay for 90-day supply.  Pt states he uses [facility] mail order pharmacy and will need script for 30-day supply to fill locally and script for 90-day supply faxed to [facility].

## 2011-08-22 NOTE — Progress Notes (Signed)
4098-1191 Cardiac Rehab Pt states that he has walked in hall independently, denies any SOB or c/p. Completed discharge education with pt. He agrees to [employer]. CRP in [place], will send referral.

## 2011-08-22 NOTE — Discharge Summary (Signed)
Patient ID: [name] MRN: [id number] DOB/AGE: 09/04/1945 66 y.o.  Admit date: 08/21/2011 Discharge date: 08/22/2011  Primary Discharge Diagnosis:  Angina at rest/ unstable angina  Secondary Discharge Diagnosis: Coronary artery disease  Hypertension  Hyperlipidemia  Anxiety disorde  Significant Diagnostic Studies: angiography: Coronary angiography and DES mid LAD  Consults: None  Hospital Course:  Came in to short stay for same day cath after nuclear testing demonstrated apical ischemia. He was found to have severe mid LAD stenosis and also 99% OM 1. He did not tolerate the procedure well due to inability to sedate and musculoskeletal pain. Movement and anxiety prevented successful therapy of the OM. We successfully stented the LAD and he has had no recurrent pain since procedure.  If he requires future intervention, I would use the femoral approach and may need anesthesia to provide sedation.   Discharge Exam: Blood pressure 152/68, pulse 63, temperature 97.7 F (36.5 C), temperature source Oral, resp. rate 18, height 5\' 9"  (1.753 m), weight 122.5 kg (270 lb 1 oz), SpO2 99.00%.    Radial cath site unremarkable.  Labs:   Lab Results  Component Value Date   WBC 7.4 08/22/2011   HGB 13.7 08/22/2011   HCT 38.9* 08/22/2011   MCV 85.3 08/22/2011   PLT 190 08/22/2011     Lab 08/22/11 0400  NA 133*  K 3.9  CL 99  CO2 25  BUN 14  CREATININE 0.78  CALCIUM 9.1  PROT --  BILITOT --  ALKPHOS --  ALT --  AST --  GLUCOSE 180*   Lab Results  Component Value Date   CKTOTAL 164 07/31/2011   CKMB 2.9 07/31/2011   TROPONINI <0.30 07/31/2011    Lab Results  Component Value Date   CHOL 175 07/31/2011   Lab Results  Component Value Date   HDL 38* 07/31/2011   Lab Results  Component Value Date   LDLCALC 105* 07/31/2011   Lab Results  Component Value Date   TRIG 161* 07/31/2011   Lab Results  Component Value Date   CHOLHDL 4.6 07/31/2011   No results found for  this basename: LDLDIRECT        FOLLOW UP PLANS AND APPOINTMENTS Discharge Orders    Future Orders Please Complete By Expires   Amb Referral to Cardiac Rehabilitation        Medication List  As of 08/22/2011 11:29 AM   TAKE these medications         amLODipine 10 MG tablet   Commonly known as: NORVASC   Take 10 mg by mouth daily.      aspirin 325 MG EC tablet   Take 1 tablet (325 mg total) by mouth daily.      carvedilol 12.5 MG tablet   Commonly known as: COREG   Take 12.5 mg by mouth 2 (two) times daily with a meal.      clopidogrel 75 MG tablet   Commonly known as: PLAVIX   Take 1 tablet (75 mg total) by mouth daily with breakfast.      DULoxetine 60 MG capsule   Commonly known as: CYMBALTA   Take 60 mg by mouth daily.      isosorbide mononitrate 60 MG 24 hr tablet   Commonly known as: IMDUR   Take 1 tablet (60 mg total) by mouth daily.      losartan 100 MG tablet   Commonly known as: COZAAR   Take 100 mg by mouth daily.  metFORMIN 500 MG 24 hr tablet   Commonly known as: GLUCOPHAGE-XR   Take 2,000 mg by mouth 2 (two) times daily.      nabumetone 750 MG tablet   Commonly known as: RELAFEN   Take 750 mg by mouth 2 (two) times daily.      nitroGLYCERIN 0.4 MG SL tablet   Commonly known as: NITROSTAT   Place 0.4 mg under the tongue every 5 (five) minutes as needed. For chest pain      Vitamin D3 1000 UNITS Caps   Take 1 capsule by mouth daily.           Follow-up Information    Follow up with Lesleigh Noe, MD on 09/10/2011. (2:15  PM)    Contact information:   [address] [place] [address] [place] [address] [phone]          BRING ALL MEDICATIONS WITH YOU TO FOLLOW UP APPOINTMENTS  Time spent with patient to include physician time: 30 min   Sgned: Veatrice Kells W 08/22/2011, 11:29 AM

## 2011-08-22 NOTE — Discharge Instructions (Signed)
Radial Site Care Refer to this sheet in the next few weeks. These instructions provide you with information on caring for yourself after your procedure. Your caregiver may also give you more specific instructions. Your treatment has been planned according to current medical practices, but problems sometimes occur. Call your caregiver if you have any problems or questions after your procedure. HOME CARE INSTRUCTIONS  You may shower the day after the procedure.Remove the bandage (dressing) and gently wash the site with plain soap and water.Gently pat the site dry.   Do not apply powder or lotion to the site.   Do not submerge the affected site in water for 3 to 5 days.   Inspect the site at least twice daily.   Do not flex or bend the affected arm for 24 hours.   No lifting over 5 pounds (2.3 kg) for 5 days after your procedure.   Do not drive home if you are discharged the same day of the procedure. Have someone else drive you.   You may drive 24 hours after the procedure unless otherwise instructed by your caregiver.   Do not operate machinery or power tools for 24 hours.   A responsible adult should be with you for the first 24 hours after you arrive home.  What to expect:  Any bruising will usually fade within 1 to 2 weeks.   Blood that collects in the tissue (hematoma) may be painful to the touch. It should usually decrease in size and tenderness within 1 to 2 weeks.  SEEK IMMEDIATE MEDICAL CARE IF:  You have unusual pain at the radial site.   You have redness, warmth, swelling, or pain at the radial site.   You have drainage (other than a small amount of blood on the dressing).   You have chills.   You have a fever or persistent symptoms for more than 72 hours.   You have a fever and your symptoms suddenly get worse.   Your arm becomes pale, cool, tingly, or numb.   You have heavy bleeding from the site. Hold pressure on the site.  Document Released: 05/03/2010  Document Revised: 03/20/2011 Document Reviewed: 05/03/2010 [employer] Patient Information 2012 [employer]. 

## 2011-08-25 LAB — GLUCOSE, CAPILLARY: Glucose-Capillary: 127 mg/dL — ABNORMAL HIGH (ref 70–99)

## 2011-08-29 ENCOUNTER — Ambulatory Visit
Admission: RE | Admit: 2011-08-29 | Discharge: 2011-08-29 | Disposition: A | Discharge status: Home / Self Care | Payer: 59 | Source: Ambulatory Visit | Attending: Physician Assistant | Admitting: Physician Assistant

## 2011-08-29 ENCOUNTER — Other Ambulatory Visit: Payer: Self-pay | Admitting: Physician Assistant

## 2011-08-29 DIAGNOSIS — R51 Headache: Secondary | ICD-10-CM

## 2011-08-29 MED ORDER — IOHEXOL 300 MG/ML  SOLN
75.0000 mL | Freq: Once | INTRAMUSCULAR | Status: AC | PRN
  Administered 2011-08-29: 75 mL via INTRAVENOUS

## 2011-08-31 ENCOUNTER — Emergency Department (HOSPITAL_COMMUNITY): Payer: 59

## 2011-08-31 ENCOUNTER — Observation Stay (HOSPITAL_COMMUNITY)
Admission: EM | Admit: 2011-08-31 | Discharge: 2011-09-02 | DRG: 303 | Disposition: A | Discharge status: Home / Self Care | Payer: 59 | Source: Ambulatory Visit | Attending: Interventional Cardiology | Admitting: Interventional Cardiology

## 2011-08-31 ENCOUNTER — Encounter (HOSPITAL_COMMUNITY): Payer: Self-pay | Admitting: *Deleted

## 2011-08-31 DIAGNOSIS — Z7902 Long term (current) use of antithrombotics/antiplatelets: Secondary | ICD-10-CM

## 2011-08-31 DIAGNOSIS — Z882 Allergy status to sulfonamides status: Secondary | ICD-10-CM

## 2011-08-31 DIAGNOSIS — R079 Chest pain, unspecified: Secondary | ICD-10-CM

## 2011-08-31 DIAGNOSIS — Z7982 Long term (current) use of aspirin: Secondary | ICD-10-CM

## 2011-08-31 DIAGNOSIS — Z79899 Other long term (current) drug therapy: Secondary | ICD-10-CM

## 2011-08-31 DIAGNOSIS — E119 Type 2 diabetes mellitus without complications: Secondary | ICD-10-CM | POA: Diagnosis present

## 2011-08-31 DIAGNOSIS — K219 Gastro-esophageal reflux disease without esophagitis: Secondary | ICD-10-CM | POA: Diagnosis present

## 2011-08-31 DIAGNOSIS — F329 Major depressive disorder, single episode, unspecified: Secondary | ICD-10-CM | POA: Diagnosis present

## 2011-08-31 DIAGNOSIS — Z87891 Personal history of nicotine dependence: Secondary | ICD-10-CM

## 2011-08-31 DIAGNOSIS — M1990 Unspecified osteoarthritis, unspecified site: Secondary | ICD-10-CM | POA: Diagnosis present

## 2011-08-31 DIAGNOSIS — I2510 Atherosclerotic heart disease of native coronary artery without angina pectoris: Principal | ICD-10-CM | POA: Diagnosis present

## 2011-08-31 DIAGNOSIS — R51 Headache: Secondary | ICD-10-CM | POA: Diagnosis present

## 2011-08-31 DIAGNOSIS — E785 Hyperlipidemia, unspecified: Secondary | ICD-10-CM | POA: Diagnosis present

## 2011-08-31 DIAGNOSIS — F3289 Other specified depressive episodes: Secondary | ICD-10-CM | POA: Diagnosis present

## 2011-08-31 DIAGNOSIS — I10 Essential (primary) hypertension: Secondary | ICD-10-CM | POA: Diagnosis present

## 2011-08-31 DIAGNOSIS — F411 Generalized anxiety disorder: Secondary | ICD-10-CM | POA: Diagnosis present

## 2011-08-31 LAB — CBC
HCT: 40.5 % (ref 39.0–52.0)
Hemoglobin: 14.5 g/dL (ref 13.0–17.0)
MCH: 30.6 pg (ref 26.0–34.0)
MCV: 85.4 fL (ref 78.0–100.0)
Platelets: 224 10*3/uL (ref 150–400)
RBC: 4.74 MIL/uL (ref 4.22–5.81)
WBC: 6.2 10*3/uL (ref 4.0–10.5)

## 2011-08-31 LAB — BASIC METABOLIC PANEL
BUN: 15 mg/dL (ref 6–23)
CO2: 23 mEq/L (ref 19–32)
Calcium: 9.9 mg/dL (ref 8.4–10.5)
Chloride: 96 mEq/L (ref 96–112)
Creatinine, Ser: 0.75 mg/dL (ref 0.50–1.35)
Glucose, Bld: 238 mg/dL — ABNORMAL HIGH (ref 70–99)

## 2011-08-31 LAB — CARDIAC PANEL(CRET KIN+CKTOT+MB+TROPI)
CK, MB: 2.6 ng/mL (ref 0.3–4.0)
Relative Index: INVALID (ref 0.0–2.5)
Total CK: 71 U/L (ref 7–232)
Total CK: 62 U/L (ref 7–232)

## 2011-08-31 LAB — PRO B NATRIURETIC PEPTIDE: Pro B Natriuretic peptide (BNP): 12.3 pg/mL (ref 0–125)

## 2011-08-31 LAB — COMPREHENSIVE METABOLIC PANEL
ALT: 25 U/L (ref 0–53)
BUN: 14 mg/dL (ref 6–23)
CO2: 25 mEq/L (ref 19–32)
Calcium: 9.8 mg/dL (ref 8.4–10.5)
Creatinine, Ser: 0.79 mg/dL (ref 0.50–1.35)
GFR calc Af Amer: >90 mL/min (ref >90)
GFR calc non Af Amer: >90 mL/min (ref >90)
Glucose, Bld: 104 mg/dL — ABNORMAL HIGH (ref 70–99)
Sodium: 137 mEq/L (ref 135–145)
Total Protein: 7.1 g/dL (ref 6.0–8.3)

## 2011-08-31 LAB — GLUCOSE, CAPILLARY: Glucose-Capillary: 184 mg/dL — ABNORMAL HIGH (ref 70–99)

## 2011-08-31 LAB — PROTIME-INR: Prothrombin Time: 14.6 seconds (ref 11.6–15.2)

## 2011-08-31 LAB — HEPARIN LEVEL (UNFRACTIONATED): Heparin Unfractionated: 0.22 IU/mL — ABNORMAL LOW (ref 0.30–0.70)

## 2011-08-31 MED ORDER — LOSARTAN POTASSIUM 50 MG PO TABS
100.0000 mg | ORAL_TABLET | Freq: Every day | ORAL | Status: DC
  Administered 2011-09-01: 100 mg via ORAL
  Filled 2011-08-31 (×2): qty 2

## 2011-08-31 MED ORDER — CARVEDILOL 12.5 MG PO TABS
12.5000 mg | ORAL_TABLET | Freq: Two times a day (BID) | ORAL | Status: DC
  Administered 2011-08-31 – 2011-09-02 (×4): 12.5 mg via ORAL
  Filled 2011-08-31 (×6): qty 1

## 2011-08-31 MED ORDER — AMLODIPINE BESYLATE 10 MG PO TABS
10.0000 mg | ORAL_TABLET | Freq: Every day | ORAL | Status: DC
  Administered 2011-09-01: 10 mg via ORAL
  Filled 2011-08-31 (×2): qty 1

## 2011-08-31 MED ORDER — ONDANSETRON HCL 4 MG/2ML IJ SOLN
4.0000 mg | Freq: Four times a day (QID) | INTRAMUSCULAR | Status: DC | PRN
  Administered 2011-08-31: 4 mg via INTRAVENOUS
  Filled 2011-08-31: qty 2

## 2011-08-31 MED ORDER — MORPHINE SULFATE 2 MG/ML IJ SOLN
2.0000 mg | INTRAMUSCULAR | Status: DC | PRN
  Administered 2011-08-31: 2 mg via INTRAVENOUS
  Filled 2011-08-31: qty 1

## 2011-08-31 MED ORDER — NITROGLYCERIN 0.4 MG SL SUBL: 0.4000 mg | SUBLINGUAL_TABLET | SUBLINGUAL | Status: DC | PRN

## 2011-08-31 MED ORDER — MORPHINE SULFATE 4 MG/ML IJ SOLN
4.0000 mg | Freq: Once | INTRAMUSCULAR | Status: AC
  Administered 2011-08-31: 4 mg via INTRAVENOUS
  Filled 2011-08-31: qty 1

## 2011-08-31 MED ORDER — ASPIRIN EC 325 MG PO TBEC
325.0000 mg | DELAYED_RELEASE_TABLET | Freq: Every day | ORAL | Status: DC
  Administered 2011-09-01: 325 mg via ORAL
  Filled 2011-08-31 (×2): qty 1

## 2011-08-31 MED ORDER — ONDANSETRON HCL 4 MG/2ML IJ SOLN
INTRAMUSCULAR | Status: AC
  Administered 2011-08-31: 4 mg
  Filled 2011-08-31: qty 2

## 2011-08-31 MED ORDER — ASPIRIN 325 MG PO TABS: 325.0000 mg | ORAL_TABLET | ORAL | Status: DC

## 2011-08-31 MED ORDER — CLOPIDOGREL BISULFATE 75 MG PO TABS
75.0000 mg | ORAL_TABLET | Freq: Every day | ORAL | Status: DC
  Administered 2011-09-01 – 2011-09-02 (×2): 75 mg via ORAL
  Filled 2011-08-31 (×3): qty 1

## 2011-08-31 MED ORDER — LOSARTAN POTASSIUM 50 MG PO TABS: 100.0000 mg | ORAL_TABLET | Freq: Every day | ORAL | Status: DC

## 2011-08-31 MED ORDER — HEPARIN BOLUS VIA INFUSION
1500.0000 [IU] | Freq: Once | INTRAVENOUS | Status: AC
  Administered 2011-08-31: 1500 [IU] via INTRAVENOUS
  Filled 2011-08-31: qty 1500

## 2011-08-31 MED ORDER — SODIUM CHLORIDE 0.9 % IJ SOLN: 3.0000 mL | INTRAMUSCULAR | Status: DC | PRN

## 2011-08-31 MED ORDER — RANOLAZINE ER 500 MG PO TB12
500.0000 mg | ORAL_TABLET | Freq: Two times a day (BID) | ORAL | Status: DC
  Administered 2011-08-31 – 2011-09-01 (×2): 500 mg via ORAL
  Filled 2011-08-31 (×4): qty 1

## 2011-08-31 MED ORDER — HEPARIN (PORCINE) IN NACL 100-0.45 UNIT/ML-% IJ SOLN
1400.0000 [IU]/h | INTRAMUSCULAR | Status: DC
  Administered 2011-08-31: 1000 [IU]/h via INTRAVENOUS
  Administered 2011-09-01: 1400 [IU]/h via INTRAVENOUS
  Filled 2011-08-31 (×4): qty 250

## 2011-08-31 MED ORDER — ASPIRIN 81 MG PO CHEW
324.0000 mg | CHEWABLE_TABLET | Freq: Once | ORAL | Status: AC
  Administered 2011-08-31: 324 mg via ORAL
  Filled 2011-08-31: qty 4

## 2011-08-31 MED ORDER — INSULIN ASPART 100 UNIT/ML ~~LOC~~ SOLN
0.0000 [IU] | Freq: Three times a day (TID) | SUBCUTANEOUS | Status: DC
  Administered 2011-08-31 (×2): 3 [IU] via SUBCUTANEOUS
  Administered 2011-09-01 (×2): 2 [IU] via SUBCUTANEOUS
  Administered 2011-09-01: 3 [IU] via SUBCUTANEOUS
  Administered 2011-09-02: 2 [IU] via SUBCUTANEOUS

## 2011-08-31 MED ORDER — ACETAMINOPHEN 325 MG PO TABS: 650.0000 mg | ORAL_TABLET | ORAL | Status: DC | PRN

## 2011-08-31 MED ORDER — NABUMETONE 750 MG PO TABS
750.0000 mg | ORAL_TABLET | Freq: Two times a day (BID) | ORAL | Status: DC | PRN
  Filled 2011-08-31: qty 1

## 2011-08-31 MED ORDER — SODIUM CHLORIDE 0.9 % IJ SOLN
3.0000 mL | Freq: Two times a day (BID) | INTRAMUSCULAR | Status: DC
  Administered 2011-09-01: 3 mL via INTRAVENOUS

## 2011-08-31 MED ORDER — LORAZEPAM 0.5 MG PO TABS
0.5000 mg | ORAL_TABLET | Freq: Three times a day (TID) | ORAL | Status: DC | PRN
  Administered 2011-08-31 – 2011-09-01 (×2): 0.5 mg via ORAL
  Filled 2011-08-31 (×2): qty 1

## 2011-08-31 MED ORDER — ATORVASTATIN CALCIUM 80 MG PO TABS
80.0000 mg | ORAL_TABLET | Freq: Every day | ORAL | Status: DC
  Administered 2011-08-31 – 2011-09-01 (×2): 80 mg via ORAL
  Filled 2011-08-31 (×2): qty 1

## 2011-08-31 MED ORDER — SODIUM CHLORIDE 0.9 % IV SOLN: 250.0000 mL | INTRAVENOUS | Status: DC | PRN

## 2011-08-31 NOTE — Progress Notes (Signed)
Pharmacy: Re-Heparin  A: Patient a 66 y.o M on heparin for CP. First  heparin level now back subtherapeutic at 0.22.  No issues with IV line per RN.  P: 1) Will give heparin 1500 units IV bolus x1, then increase to 1400 units/hr 2) re-check another 6 hour heparin level  Dorna Leitz, PharmD, BCPS

## 2011-08-31 NOTE — ED Notes (Signed)
States he started having chest pressure on set this am 500am. Non-radiating

## 2011-08-31 NOTE — ED Notes (Signed)
MD at bedside. Cardiologist at bedside.  

## 2011-08-31 NOTE — ED Notes (Signed)
Pt reports waking this am approx 0430, shortly after eating breakfast pt reports (L) side chest pain/burning, nausea, dizziness, and diaphoretic. Pt had a stent placed last Thursday and d/c'd home, pt took Nitro 0.4 mg Sl x3 prior to coming to hospital, pt reports his pain decreased from a 10 to 8.

## 2011-08-31 NOTE — ED Notes (Addendum)
MD at bedside. EDP Zackowski

## 2011-08-31 NOTE — ED Provider Notes (Signed)
History     CSN: [id number]  Arrival date & time 08/31/11  4098   First MD Initiated Contact with Patient 08/31/11 0635      7:03 AM HPI Reports Cp that began this morning while eating breakfast. Points to lain that is left substernal. Describes pain as a chest pressure. Associated with diaphoresis and Nausea. Reports significant h/o cath and stent placed on 08/22/2011 by Dr. Verdis Prime. Reports Chest has been asymptomatic since then. States he called his physician this morning and was advised to take 2 nitro and if pain persisted to come to the ED. Reports no improvement of pain. Rates a 6/10 Patient is a 66 y.o. male presenting with chest pain. The history is provided by the patient.  Chest Pain The chest pain began 1 - 2 hours ago. Chest pain occurs constantly. The chest pain is unchanged. The severity of the pain is moderate. The quality of the pain is described as pressure-like. The pain does not radiate. Primary symptoms include nausea. Pertinent negatives for primary symptoms include no fever, no fatigue, no syncope, no shortness of breath, no cough, no wheezing, no palpitations, no abdominal pain, no vomiting, no dizziness and no altered mental status.  Associated symptoms include diaphoresis.  Pertinent negatives for associated symptoms include no numbness, no orthopnea and no weakness. He tried nitroglycerin for the symptoms. Risk factors include male gender, obesity, lack of exercise and sedentary lifestyle.  His past medical history is significant for CAD, diabetes and hypertension.  Pertinent negatives for past medical history include no DVT and no PE.  Procedure history is positive for cardiac catheterization (on 08/22/2011).     Past Medical History  Diagnosis Date  . Coronary artery disease   . Hypertension   . Shortness of breath on exertion     "@ times"  . Type II diabetes mellitus   . Headache     "every now & then"  . DJD (degenerative joint disease)   . Arthritis    . Gout   . Kidney stones   . Tuberculosis   . Anxiety   . Depression   . GERD (gastroesophageal reflux disease)     Past Surgical History  Procedure Date  . Cholecystectomy 1970's  . Shoulder arthroscopy w/ rotator cuff repair ~ 2011    left  . Anterior cervical decomp/discectomy fusion 04/2007    C4-5; C6-7  . Back surgery     "3 times; last time was screws in lower back 10/2008"  . Coronary angioplasty 1999  . Lithotripsy   . Pci 5/09/2-13    LAD    No family history on file.  History  Substance Use Topics  . Smoking status: Former Smoker    Types: Cigarettes    Quit date: 04/14/1966  . Smokeless tobacco: Current User    Types: Chew   Comment: "smoked a pack q 2-3 weeks"  . Alcohol Use: No      Review of Systems  Constitutional: Positive for diaphoresis. Negative for fever, chills and fatigue.  HENT: Negative for sore throat.   Respiratory: Negative for cough, shortness of breath and wheezing.   Cardiovascular: Positive for chest pain. Negative for palpitations, orthopnea and syncope.  Gastrointestinal: Positive for nausea. Negative for vomiting and abdominal pain.  Musculoskeletal: Negative for back pain.  Neurological: Negative for dizziness, weakness, numbness and headaches.  Psychiatric/Behavioral: Negative for altered mental status.  All other systems reviewed and are negative.    Allergies  Sulfa antibiotics  Home  Medications   Current Outpatient Rx  Name Route Sig Dispense Refill  . AMLODIPINE BESYLATE 10 MG PO TABS Oral Take 10 mg by mouth daily.    . ASPIRIN 325 MG PO TBEC Oral Take 1 tablet (325 mg total) by mouth daily. 30 tablet   . CARVEDILOL 12.5 MG PO TABS Oral Take 12.5 mg by mouth 2 (two) times daily with a meal.    . VITAMIN D3 1000 UNITS PO CAPS Oral Take 1 capsule by mouth daily.    Marland Kitchen CLOPIDOGREL BISULFATE 75 MG PO TABS Oral Take 1 tablet (75 mg total) by mouth daily with breakfast. 30 tablet 11  . DULOXETINE HCL 60 MG PO CPEP Oral  Take 60 mg by mouth daily.    . ISOSORBIDE MONONITRATE ER 60 MG PO TB24 Oral Take 1 tablet (60 mg total) by mouth daily. 30 tablet 11  . LOSARTAN POTASSIUM 100 MG PO TABS Oral Take 100 mg by mouth daily.    Marland Kitchen METFORMIN HCL ER 500 MG PO TB24 Oral Take 2,000 mg by mouth 2 (two) times daily.    Marland Kitchen NABUMETONE 750 MG PO TABS Oral Take 750 mg by mouth 2 (two) times daily.    Marland Kitchen NITROGLYCERIN 0.4 MG SL SUBL Sublingual Place 0.4 mg under the tongue every 5 (five) minutes as needed. For chest pain      BP 123/59  Pulse 68  Temp(Src) 98.2 F (36.8 C) (Oral)  Resp 24  SpO2 98%  Physical Exam  Constitutional: He is oriented to person, place, and time. He appears well-developed and well-nourished.  HENT:  Head: Normocephalic and atraumatic.  Eyes: Conjunctivae are normal. Pupils are equal, round, and reactive to light.  Neck: Normal range of motion. Neck supple.  Cardiovascular: Normal rate, regular rhythm and normal heart sounds.  Exam reveals no gallop and no friction rub.   No murmur heard. Pulmonary/Chest: Effort normal and breath sounds normal. No respiratory distress. He has no wheezes. He has no rales. He exhibits no tenderness.  Abdominal: Soft. Bowel sounds are normal. He exhibits no distension and no mass. There is no tenderness. There is no rebound and no guarding.  Neurological: He is alert and oriented to person, place, and time.  Skin: Skin is warm and dry. No rash noted. No erythema. No pallor.  Psychiatric: He has a normal mood and affect. His behavior is normal.    ED Course  Procedures   Date: 08/31/2011  Rate: 64  Rhythm: normal sinus rhythm  QRS Axis: normal  Intervals: normal  ST/T Wave abnormalities: normal  Conduction Disutrbances: none  Old EKG Reviewed: No significant changes noted since 08/22/2011  Results for orders placed during the hospital encounter of 08/31/11  CBC      Component Value Range   WBC 6.2  4.0 - 10.5 (K/uL)   RBC 4.74  4.22 - 5.81 (MIL/uL)    Hemoglobin 14.5  13.0 - 17.0 (g/dL)   HCT 09.6  04.5 - 40.9 (%)   MCV 85.4  78.0 - 100.0 (fL)   MCH 30.6  26.0 - 34.0 (pg)   MCHC 35.8  30.0 - 36.0 (g/dL)   RDW 81.1  91.4 - 78.2 (%)   Platelets 224  150 - 400 (K/uL)  BASIC METABOLIC PANEL      Component Value Range   Sodium 135  135 - 145 (mEq/L)   Potassium 4.1  3.5 - 5.1 (mEq/L)   Chloride 96  96 - 112 (mEq/L)   CO2  23  19 - 32 (mEq/L)   Glucose, Bld 238 (*) 70 - 99 (mg/dL)   BUN 15  6 - 23 (mg/dL)   Creatinine, Ser 1.61  0.50 - 1.35 (mg/dL)   Calcium 9.9  8.4 - 09.6 (mg/dL)   GFR calc non Af Amer >90  >90 (mL/min)   GFR calc Af Amer >90  >90 (mL/min)  PRO B NATRIURETIC PEPTIDE      Component Value Range   Pro B Natriuretic peptide (BNP) 12.3  0 - 125 (pg/mL)    Chest Portable 1 View  08/31/2011  *RADIOLOGY REPORT*  Clinical Data: Chest pain.  PORTABLE CHEST - 1 VIEW  Comparison: Chest x-ray 07/30/2011.  Findings: Lung volumes are slightly low.  No consolidative airspace disease.  No definite pleural effusions.  Pulmonary vasculature and the cardiomediastinal silhouette are within normal limits. Orthopedic fixation hardware is incompletely visualized in the lower cervical spine. Multiple small well defined dense nodules are seen scattered throughout the lungs bilaterally, most notably in the left mid lung, compatible with calcified granulomas (these are similar to prior study from 10/31/2008).  IMPRESSION: 1.  Low lung volumes without radiographic evidence of acute cardiopulmonary disease. 2.  Old granulomatous disease.  Original Report Authenticated By: Florencia Reasons, M.D.      MDM   8:21 AM Discussed labs and imaging with pt and family. Advised that b/c he is 9 days s/p stent, we would call cardiology when labs returned. Discussed plan with Dr. Deretha Emory, who has spoke with Dr. Donnie Aho to come see patient      Thomasene Lot, Cordelia Poche 08/31/11 0454

## 2011-08-31 NOTE — ED Provider Notes (Addendum)
Medical screening examination/treatment/procedure(s) were conducted as a shared visit with non-physician practitioner(s) and myself.  I personally evaluated the patient during the encounter  [facility] to see in ED. No acute EKG changes Tn pending but recent stent and new left chest pain, not likely occlusion of stent since no ekg changes but will start on heparin and give morphine for persistent pain already had asa, and ntg x3 pain after that still 6/10. Patient still could have occlusion of smaller vessel.    Date: 08/31/2011  Rate: 64  Rhythm: normal sinus rhythm  QRS Axis: normal  Intervals: normal  ST/T Wave abnormalities: normal  Conduction Disutrbances:none  Narrative Interpretation:   Old EKG Reviewed: unchanged No change since 08/22/11    CRITICAL CARE Performed by: Shelda Jakes.   Total critical care time: 30  Critical care time was exclusive of separately billable procedures and treating other patients.  Critical care was necessary to treat or prevent imminent or life-threatening deterioration.  Critical care was time spent personally by me on the following activities: development of treatment plan with patient and/or surrogate as well as nursing, discussions with consultants, evaluation of patient's response to treatment, examination of patient, obtaining history from patient or surrogate, ordering and performing treatments and interventions, ordering and review of laboratory studies, ordering and review of radiographic studies, pulse oximetry and re-evaluation of patient's condition.   Shelda Jakes, MD 08/31/11 1610  Shelda Jakes, MD 08/31/11 [id number]

## 2011-08-31 NOTE — ED Notes (Signed)
The patient states he to nitro-stat 0.4 mg, sL x 3 prior to arrival.  They brought his pain down from 8/10 to 6/10.  The patient states that he had a stent placed last week.

## 2011-08-31 NOTE — H&P (Signed)
History and Physical  Patient ID: [name] MRN: [id number], SOB: 1946-02-17 66 y.o. Date of Encounter: 08/31/2011, 10:14 AM  Primary Physician: Sharrie Rothman, PA Primary Cardiologist: Dr. Verdis Prime, III Primary Electrophysiologist:  None   Chief Complaint:  Chest Pain  History of Present Illness: [name] is a 66 y.o. male who is followed by Dr. Katrinka Blazing with [facility].  He has a h/o CAD, DM2, HTN, HL, GERD.  He was previously known to have non-obstructive CAD by cath in the 1990s.  Presented to [facility] in 4/13 with chest pain.  MI was ruled out and OP myoview demonstrated ant and apical ischemia.  He was brought in for cardiac cath 08/21/11:  pLAD 50%, mLAD 30-60%, mLAD 95%, OM1 99%, dPDA 95%, mRCA 70%, EF 70%.  The mLAD was tx with a Promus DES.  Attempts were made at PCI of the OM1 but this was unsuccessful mainly due to exacerbation of back pain.  Recommendation was to proceed by femoral approach in the future should he need re-attempt at PCI of the OM1.  Medical Rx was otherwise continued and patient was d/c'd home.  Patient states he had to stop taking Imdur due to headache and he was seen by his PCP for ongoing HA's.  Head CT was negative and he was tx with oxycodone/APAP prn.  Dr. Katrinka Blazing placed him on Ranexa (he thinks 500 mg) bid.  Patient notes CP off and on since his PCI.  Today, he developed sudden onset left sided pressure with assoc diaphoresis and nausea.  He took NTG x 3 and states (initially) that his pain lasted 45 mins.  In follow up, he states he is still having CP now.  Denies assoc dyspnea, radiation.  He has chronic DOE with more extreme activities without change.  Denies exertional CP.  No dysphagia, indigestion.  No syncope.  No orthopnea, PND, edema.     Past Medical History  Diagnosis Date  . Coronary artery disease     cardiac cath 08/21/11:  pLAD 50%, mLAD 30-60%, mLAD 95%, OM1 99%, dPDA 95%, mRCA 70%, EF 70%.  The mLAD was tx with a Promus DES.  Attempts  were made at PCI of the OM1 but this was unsuccessful mainly due to exacerbation of back pain  . Hypertension   . Type II diabetes mellitus   . Headache     "every now & then"  . DJD (degenerative joint disease)   . Arthritis   . Gout   . Kidney stones   . Tuberculosis   . Anxiety   . Depression   . GERD (gastroesophageal reflux disease)      Past Surgical History  Procedure Date  . Cholecystectomy 1970's  . Shoulder arthroscopy w/ rotator cuff repair ~ 2011    left  . Anterior cervical decomp/discectomy fusion 04/2007    C4-5; C6-7  . Back surgery     "3 times; last time was screws in lower back 10/2008"  . Coronary angioplasty 1999  . Lithotripsy   . Pci 5/09/2-13    LAD      Current Facility-Administered Medications  Medication Dose Route Frequency Provider Last Rate Last Dose  . aspirin chewable tablet 324 mg  324 mg Oral Once Thomasene Lot, PA-C   324 mg at 08/31/11 0801  . aspirin tablet 325 mg  325 mg Oral STAT Shelda Jakes, MD      . heparin ADULT infusion 100 units/mL (25000 units/250 mL)  1,000  Units/hr Intravenous Continuous Thomasene Lot, PA-C 10 mL/hr at 08/31/11 0832 1,000 Units/hr at 08/31/11 0832  . morphine 4 MG/ML injection 4 mg  4 mg Intravenous Once Thomasene Lot, PA-C   4 mg at 08/31/11 0800  . nitroGLYCERIN (NITROSTAT) SL tablet 0.4 mg  0.4 mg Sublingual Q5 min PRN Shelda Jakes, MD      . ondansetron [facility]) 4 MG/2ML injection        4 mg at 08/31/11 4098   Current Outpatient Prescriptions  Medication Sig Dispense Refill  . amLODipine (NORVASC) 10 MG tablet Take 10 mg by mouth daily.      Marland Kitchen aspirin EC 325 MG EC tablet Take 1 tablet (325 mg total) by mouth daily.  30 tablet    . carvedilol (COREG) 12.5 MG tablet Take 12.5 mg by mouth 2 (two) times daily with a meal.      . Cholecalciferol (VITAMIN D3) 1000 UNITS CAPS Take 1 capsule by mouth daily.      . clopidogrel (PLAVIX) 75 MG tablet Take 1 tablet (75 mg total) by mouth daily  with breakfast.  30 tablet  11  . DULoxetine (CYMBALTA) 60 MG capsule Take 60 mg by mouth daily.      . isosorbide mononitrate (IMDUR) 60 MG 24 hr tablet Take 1 tablet (60 mg total) by mouth daily.  30 tablet  11  . losartan (COZAAR) 100 MG tablet Take 100 mg by mouth daily.      . metFORMIN (GLUCOPHAGE-XR) 500 MG 24 hr tablet Take 2,000 mg by mouth 2 (two) times daily.      . nabumetone (RELAFEN) 750 MG tablet Take 750 mg by mouth 2 (two) times daily.      . nitroGLYCERIN (NITROSTAT) 0.4 MG SL tablet Place 0.4 mg under the tongue every 5 (five) minutes as needed. For chest pain         Allergies: Allergies  Allergen Reactions  . Sulfa Antibiotics Rash     History  Substance Use Topics  . Smoking status: Former Smoker    Types: Cigarettes    Quit date: 04/14/1966  . Smokeless tobacco: Current User    Types: Chew   Comment: "smoked a pack q 2-3 weeks"  . Alcohol Use: No     Family History  Problem Relation Age of Onset  . Cancer Father      ROS:  Please see the history of present illness.   He notes some vomiting and diarrhea last week, now resolved.   All other systems reviewed and negative.   Vital Signs: Blood pressure 121/57, pulse 60, temperature 98.2 F (36.8 C), temperature source Oral, resp. rate 22, SpO2 100.00%.  PHYSICAL EXAM: General:  Well nourished, well developed, in no acute distress HEENT: normal Lymph: no adenopathy Neck: no JVD Endocrine:  No thryomegaly Vascular: No carotid bruits  Cardiac:  normal S1, S2; RRR; no murmur Lungs:  clear to auscultation bilaterally, no wheezing, rhonchi or rales Abd: soft, nontender, no hepatomegaly Ext: no edema Musculoskeletal:  No deformities  Skin: warm and dry Neuro:  CNs 2-12 intact, no focal abnormalities noted Psych:  Normal affect   EKG:  NSR, HR 64, non-specific ST-T changes, no change from prior  Labs:   Lab Results  Component Value Date   WBC 6.2 08/31/2011   HGB 14.5 08/31/2011   HCT 40.5  08/31/2011   MCV 85.4 08/31/2011   PLT 224 08/31/2011    Lab 08/31/11 0659  NA 135  K 4.1  CL 96  CO2 23  BUN 15  CREATININE 0.75  CALCIUM 9.9  PROT --  BILITOT --  ALKPHOS --  ALT --  AST --  GLUCOSE 238*   No results found for this basename: CKTOTAL:4,CKMB:4,TROPONINI:4 in the last 72 hours Lab Results  Component Value Date   CHOL 175 07/31/2011   HDL 38* 07/31/2011   LDLCALC 105* 07/31/2011   TRIG 161* 07/31/2011   No results found for this basename: DDIMER    Radiology/Studies:   Ct Head W Wo Contrast  08/29/2011:  IMPRESSION: Unremarkable CT examination of the head.  Original Report Authenticated By: P. Loralie Champagne, M.D.   Chest Portable 1 View  08/31/2011:    IMPRESSION: 1.  Low lung volumes without radiographic evidence of acute cardiopulmonary disease. 2.  Old granulomatous disease.  Original Report Authenticated By: Florencia Reasons, M.D.     ASSESSMENT AND PLAN:   1.  Chest Pain Atypical and typical features.  When I pressed on his left chest, it was quite painful.  However, he has also had CP similar to his pain that he had prior to his PCI.  Initial cardiac markers elevated.  CXR ok.  ECG not acute.   -  Admit to [facility] Telemetry   -  Continue Heparin IV, Plavix, ASA, beta blocker   -  Morphine PRN pain   [facility] to see in AM and decide +/- re-cath with poss PCI  2.  Coronary Artery Disease Continue ASA, Plavix, Statin, heparin. Cycle enzymes. Dr. Katrinka Blazing to see tomorrow.  3.  Headaches Recent head CT ok. PRN tylenol.  4.  Diabetes Mellitus Hold metformin in case LHC/PCI planned. Cover with SSI.  5.  Hypertension Controlled.  6.  Hyperlipidemia Start Lipitor 80.   Signed, Tereso Newcomer, PA-C  08/31/2011, 10:14 AM    I have taken a history, reviewed medications, allergies, PMH, SH, FH, and reviewed ROS and examined the patient.  I agree with the assessment and plan. He is a difficult historian but received some relief  eventually with SL NTG. Apparently was recently started on Ranexa. Will put on 500 mg po bid. May need relook cath. Will defer to Dr Katrinka Blazing.  Beckett Maden C. Daleen Squibb, MD, [facility] Pager:  [phone]

## 2011-08-31 NOTE — Progress Notes (Signed)
ANTICOAGULATION CONSULT NOTE - Initial Consult  Pharmacy Consult for IV Heparin Indication: chest pain/ACS  Allergies  Allergen Reactions  . Sulfa Antibiotics Rash    Patient Measurements:   Heparin Dosing Weight: 99kg  Vital Signs: Temp: 98.2 F (36.8 C) (05/19 0641) Temp src: Oral (05/19 0641) BP: 114/47 mmHg (05/19 1100) Pulse Rate: 60  (05/19 1100)  Labs:  [facility] 08/31/11 0659  HGB 14.5  HCT 40.5  PLT 224  APTT --  LABPROT --  INR --  HEPARINUNFRC --  CREATININE 0.75  CKTOTAL --  CKMB --  TROPONINI --    The CrCl is unknown because both a height and weight (above a minimum accepted value) are required for this calculation.   Medical History: Past Medical History  Diagnosis Date  . Coronary artery disease     cardiac cath 08/21/11:  pLAD 50%, mLAD 30-60%, mLAD 95%, OM1 99%, dPDA 95%, mRCA 70%, EF 70%.  The mLAD was tx with a Promus DES.  Attempts were made at PCI of the OM1 but this was unsuccessful mainly due to exacerbation of back pain  . Hypertension   . Type II diabetes mellitus   . Headache     "every now & then"  . DJD (degenerative joint disease)   . Arthritis   . Gout   . Kidney stones   . Tuberculosis   . Anxiety   . Depression   . GERD (gastroesophageal reflux disease)     Medications:  Heparin 1000 units/hr ASA Plavix  Assessment: 14 yoM admitted with acute CP 5/19. Similar presentation to [facility] on 4/13 with CP - MI ruled out. Cardiac cath 5/9 -EF 70%, DES to mLAD, planned for future femoral approach to stent OM1.  Pt has had IV heparin @ 1000 units/hr since 8:30 AM 5/19 - no boluses given. Pharmacy now asked to manage heparin. Will not give bolus at this point since 3.5 hours have passed. Per pt, he has no hx bleed, wt ~125kg, ht 5'9".  Renal fxn stable - CrCl ~95 ml/min. PT/INR pending. Hgb, plts ok.     Goal of Therapy:  Heparin level 0.3-0.7 units/ml Monitor platelets by anticoagulation protocol: Yes   Plan:  1. Increase  heparin to 1200 units/hr 2. F/u 6 hour heparin level 3. F/u PT/INR 4. Daily heparin level/cbc  Donnis Phaneuf E 08/31/2011,12:03 PM

## 2011-08-31 NOTE — Progress Notes (Signed)
Called to room by bedside RN. Patient c/o nausea without vomiting, dizziness, being hot without diaphoresis, no SOB, no CP. Patient admitted to using tobacco product (dip) just prior to feeling unwell and also to getting OOB to go to the bathroom. VSS: 159/83, SR 64, O2 99% on RA. Emotionally labile; crying sporadically alternating with normal affect. Noted to have history of anxiety and depression. Reports compliance with all medications ordered--including Plavix and Ranexa (Imdur caused headaches). Noted that during recent admit patient had a DES placed to the LAD, and PCI was unsuccessful to OM r/t patient's back pain on the table. POC markers thus far have been negative. EKG obtained is without any acute changes compared to the one obtained in the ED. Considering his abnormal presentation during his last admission, notified PA on-call. See Order Management for orders received. Patient received IV Zofran (ordered prn) and reports partial improvement of symptoms. Will continue to monitor closely and look for CE results as they are drawn. Harlow Asa

## 2011-09-01 LAB — CARDIAC PANEL(CRET KIN+CKTOT+MB+TROPI)
CK, MB: 2.4 ng/mL (ref 0.3–4.0)
Relative Index: INVALID (ref 0.0–2.5)
Total CK: 52 U/L (ref 7–232)
Troponin I: <0.3 ng/mL (ref <0.30)

## 2011-09-01 LAB — GLUCOSE, CAPILLARY
Glucose-Capillary: 192 mg/dL — ABNORMAL HIGH (ref 70–99)
Glucose-Capillary: 126 mg/dL — ABNORMAL HIGH (ref 70–99)

## 2011-09-01 LAB — CBC
HCT: 44 % (ref 39.0–52.0)
Hemoglobin: 15.9 g/dL (ref 13.0–17.0)
RBC: 5.17 MIL/uL (ref 4.22–5.81)
WBC: 8.3 10*3/uL (ref 4.0–10.5)

## 2011-09-01 LAB — HEPARIN LEVEL (UNFRACTIONATED): Heparin Unfractionated: 0.6 IU/mL (ref 0.30–0.70)

## 2011-09-01 MED ORDER — ENOXAPARIN SODIUM 150 MG/ML ~~LOC~~ SOLN
125.0000 mg | Freq: Two times a day (BID) | SUBCUTANEOUS | Status: DC
  Administered 2011-09-01 (×2): 125 mg via SUBCUTANEOUS
  Filled 2011-09-01 (×4): qty 1

## 2011-09-01 MED ORDER — ATORVASTATIN CALCIUM 40 MG PO TABS
40.0000 mg | ORAL_TABLET | Freq: Every day | ORAL | Status: DC
  Filled 2011-09-01: qty 1

## 2011-09-01 NOTE — Progress Notes (Signed)
ANTICOAGULATION CONSULT NOTE - Follow Up  Pharmacy Consult for IV Heparin Indication: chest pain/ACS  Allergies  Allergen Reactions  . Sulfa Antibiotics Rash    Patient Measurements: Height: 5\' 9"  (175.3 cm) Weight: 275 lb (124.739 kg) IBW/kg (Calculated) : 70.7  Heparin Dosing Weight: 99kg  Vital Signs: Temp: 98.1 F (36.7 C) (05/19 1900) Temp src: Oral (05/19 1900) BP: 114/78 mmHg (05/19 1900) Pulse Rate: 56  (05/19 1900)  Labs:  Alvira Philips 09/01/11 0057 09/01/11 0052 08/31/11 1730 08/31/11 1203 08/31/11 1109 08/31/11 0659  HGB -- -- -- -- -- 14.5  HCT -- -- -- -- -- 40.5  PLT -- -- -- -- -- 224  APTT -- -- -- 41* -- --  LABPROT -- -- -- 14.6 -- --  INR -- -- -- 1.12 -- --  HEPARINUNFRC -- 0.39 0.22* -- -- --  CREATININE -- -- -- 0.79 -- 0.75  CKTOTAL 52 -- 62 -- 71 --  CKMB 2.4 -- 2.2 -- 2.6 --  TROPONINI <0.30 -- <0.30 -- <0.30 --    Estimated Creatinine Clearance: 123.4 ml/min (by C-G formula based on Cr of 0.79).   Medical History: Past Medical History  Diagnosis Date  . Coronary artery disease     cardiac cath 08/21/11:  pLAD 50%, mLAD 30-60%, mLAD 95%, OM1 99%, dPDA 95%, mRCA 70%, EF 70%.  The mLAD was tx with a Promus DES.  Attempts were made at PCI of the OM1 but this was unsuccessful mainly due to exacerbation of back pain  . Hypertension   . Type II diabetes mellitus   . Headache     "every now & then"  . DJD (degenerative joint disease)   . Arthritis   . Gout   . Kidney stones   . Tuberculosis   . Anxiety   . Depression   . GERD (gastroesophageal reflux disease)     Medications:  Heparin 1400 units/hr  Assessment: 4 yoM admitted with acute CP 5/19. Similar presentation to [facility] on 4/13 with CP - MI ruled out. Cardiac cath 5/9 -EF 70%, DES to mLAD, planned for future femoral approach to stent OM1. Pharmacy consulted manage heparin. Heparin level (0.39) is at-goal on 1400 units/hr.   Goal of Therapy:  Heparin level 0.3-0.7 units/ml Monitor  platelets by anticoagulation protocol: Yes   Plan:  1. Continue IV heparin at 1400 units/hr 2. Follow-up 6 hour heparin level to confirm dosing.  Emeline Gins 09/01/2011,1:58 AM

## 2011-09-01 NOTE — Progress Notes (Addendum)
We had a long discussion this AM after I reviewed his recent cath /PCI results. He has no high grade proximal disease. He has OM, PDA and diagonal disease. The PDA and OM could be treated with PCI but it also seems that medical therapy could work since the territories are small. His superimposed anxiety disorder also makes differentiating  stress/ anxiety from ischemic symptoms difficult. We will give another trial of medical therapy and use anti-anxiety meds as a new addition. Plan d/c in AM if all goes well.

## 2011-09-01 NOTE — Progress Notes (Signed)
Patient Name: [name] Date of Encounter: 09/01/2011    SUBJECTIVE: He has had recurrent chest pain that is non exertional and associated with bilateral HA. The has not had response from sl NTG. Markers and ECG has been normal despite prolonged pain.  TELEMETRY:  NSR: Filed Vitals:   08/31/11 1418 08/31/11 1900 09/01/11 0430 09/01/11 0828  BP: 159/83 114/78 123/74 140/80  Pulse: 64 56 59 71  Temp: 97.5 F (36.4 C) 98.1 F (36.7 C) 98.9 F (37.2 C)   TempSrc: Oral Oral Oral   Resp: 16 17 17    Height:      Weight:   121 kg (266 lb 12.1 oz)   SpO2: 96% 98% 99%     Intake/Output Summary (Last 24 hours) at 09/01/11 0948 Last data filed at 09/01/11 0654  Gross per 24 hour  Intake  107.5 ml  Output   1725 ml  Net -1617.5 ml    LABS: Basic Metabolic Panel:  Basename 08/31/11 1203 08/31/11 0659  NA 137 135  K 4.8 4.1  CL 100 96  CO2 25 23  GLUCOSE 104* 238*  BUN 14 15  CREATININE 0.79 0.75  CALCIUM 9.8 9.9  MG -- --  PHOS -- --   CBC:  Basename 09/01/11 0817 08/31/11 0659  WBC 8.3 6.2  NEUTROABS -- --  HGB 15.9 14.5  HCT 44.0 40.5  MCV 85.1 85.4  PLT 252 224   Cardiac Enzymes:  Basename 09/01/11 0057 08/31/11 1730 08/31/11 1109  CKTOTAL 52 62 71  CKMB 2.4 2.2 2.6  CKMBINDEX -- -- --  TROPONINI <0.30 <0.30 <0.30   Cor Angio: the patient has severe OM 1(failed recent PCI), tight mid PDA, tight diagonal, moderate mid OM 2  Radiology/Studies:  No new.  Physical Exam: Blood pressure 140/80, pulse 71, temperature 98.9 F (37.2 C), temperature source Oral, resp. rate 17, height 5\' 9"  (1.753 m), weight 121 kg (266 lb 12.1 oz), SpO2 99.00%. Weight change:    No rub or gallop.  ASSESSMENT:  1. Recurrent prolonged pain in chest with no objective markers. Possibly ischemic but can't exclude anxiety related. 2. Recent LAD DES and residual high grade OM1, PDA and diagonal 1. Nuclear study prior to stent demonstrated apical ischemia. Tried med therapy after  the LAD stent due continues to have pain. 3. Depression with anxiety disorder.   Plan:  1. D/c heparin and start Lovenox 2. Ambulate 3. Consider PCI but not sure if pain is cardiac or anxiety related.  Selinda Eon 09/01/2011, 9:48 AM

## 2011-09-01 NOTE — Progress Notes (Addendum)
ANTICOAGULATION CONSULT NOTE - Follow Up  Pharmacy Consult for IV Heparin Indication: chest pain/ACS  Allergies  Allergen Reactions  . Sulfa Antibiotics Rash    Patient Measurements: Height: 5\' 9"  (175.3 cm) Weight: 266 lb 12.1 oz (121 kg) IBW/kg (Calculated) : 70.7  Heparin Dosing Weight: 99kg  Vital Signs: Temp: 98.9 F (37.2 C) (05/20 0430) Temp src: Oral (05/20 0430) BP: 140/80 mmHg (05/20 0828) Pulse Rate: 71  (05/20 0828)  Labs:  Alvira Philips 09/01/11 0817 09/01/11 0057 09/01/11 0052 08/31/11 1730 08/31/11 1203 08/31/11 1109 08/31/11 0659  HGB 15.9 -- -- -- -- -- 14.5  HCT 44.0 -- -- -- -- -- 40.5  PLT 252 -- -- -- -- -- 224  APTT -- -- -- -- 41* -- --  LABPROT -- -- -- -- 14.6 -- --  INR -- -- -- -- 1.12 -- --  HEPARINUNFRC 0.60 -- 0.39 0.22* -- -- --  CREATININE -- -- -- -- 0.79 -- 0.75  CKTOTAL -- 52 -- 62 -- 71 --  CKMB -- 2.4 -- 2.2 -- 2.6 --  TROPONINI -- <0.30 -- <0.30 -- <0.30 --    Estimated Creatinine Clearance: 121.4 ml/min (by C-G formula based on Cr of 0.79).   Medical History: Past Medical History  Diagnosis Date  . Coronary artery disease     cardiac cath 08/21/11:  pLAD 50%, mLAD 30-60%, mLAD 95%, OM1 99%, dPDA 95%, mRCA 70%, EF 70%.  The mLAD was tx with a Promus DES.  Attempts were made at PCI of the OM1 but this was unsuccessful mainly due to exacerbation of back pain  . Hypertension   . Type II diabetes mellitus   . Headache     "every now & then"  . DJD (degenerative joint disease)   . Arthritis   . Gout   . Kidney stones   . Tuberculosis   . Anxiety   . Depression   . GERD (gastroesophageal reflux disease)     Medications:  Heparin 1400 units/hr  Assessment: 55 yoM admitted with acute CP 5/19. Similar presentation to [facility] on 4/13 with CP - MI ruled out. Cardiac cath 5/9 -EF 70%, DES to mLAD, planned for future femoral approach to stent OM1. Pharmacy consulted manage heparin. Heparin level (0.6) is at-goal on 1400 units/hr.    Goal of Therapy:  Heparin level 0.3-0.7 units/ml Monitor platelets by anticoagulation protocol: Yes   Plan:  1. Continue IV heparin at 1400 units/hr 2. Cont daily heparin level and CBC  Casondra Gasca Poteet 09/01/2011,9:32 AM   Addum:  Change to lovenox 125 mg sq q12 hours.

## 2011-09-02 LAB — GLUCOSE, CAPILLARY: Glucose-Capillary: 144 mg/dL — ABNORMAL HIGH (ref 70–99)

## 2011-09-02 MED ORDER — HYDROCHLOROTHIAZIDE 12.5 MG PO CAPS: 12.5000 mg | ORAL_CAPSULE | Freq: Every day | ORAL | Status: DC

## 2011-09-02 MED ORDER — ATORVASTATIN CALCIUM 40 MG PO TABS: 40.0000 mg | ORAL_TABLET | Freq: Every day | ORAL | Status: DC

## 2011-09-02 MED ORDER — ASPIRIN 325 MG PO TBEC: 325.0000 mg | DELAYED_RELEASE_TABLET | Freq: Every day | ORAL | Status: AC

## 2011-09-02 MED ORDER — LORAZEPAM 0.5 MG PO TABS: 0.5000 mg | ORAL_TABLET | Freq: Three times a day (TID) | ORAL | Status: AC | PRN

## 2011-09-02 MED ORDER — DULOXETINE HCL 60 MG PO CPEP: 60.0000 mg | ORAL_CAPSULE | Freq: Every day | ORAL | Status: DC

## 2011-09-02 MED ORDER — HYDROCHLOROTHIAZIDE 12.5 MG PO CAPS
12.5000 mg | ORAL_CAPSULE | Freq: Every day | ORAL | Status: DC
  Filled 2011-09-02: qty 1

## 2011-09-02 MED ORDER — CARVEDILOL 25 MG PO TABS: 25.0000 mg | ORAL_TABLET | Freq: Two times a day (BID) | ORAL | Status: DC

## 2011-09-02 MED ORDER — CARVEDILOL 25 MG PO TABS
25.0000 mg | ORAL_TABLET | Freq: Two times a day (BID) | ORAL | Status: DC
  Filled 2011-09-02 (×2): qty 1

## 2011-09-02 NOTE — Discharge Summary (Signed)
Patient ID: [name] MRN: [id number] DOB/AGE: Aug 13, 1945 66 y.o.  Admit date: 08/31/2011 Discharge date: 09/02/2011  Primary Discharge Diagnosis: Recurring chest pain  Secondary Discharge Diagnosis: Coronary artery disease  Anxiety disorder  Hyperlipidemia  Diabetes mellitus  Hypertension  Significant Diagnostic Studies: none  Consults: None  Hospital Course: He was readmitted to the hospital with atypical chest pain, negative markers, and normal ECG. There was no significant response to SL NTG. We have concentrated on therapy for anxiety and BP control. Has had no recurrences of chest pain since admission.(this has been the case on prior admissions).   Discharge Exam: Blood pressure 158/92, pulse 59, temperature 98.9 F (37.2 C), temperature source Oral, resp. rate 20, height 5\' 9"  (1.753 m), weight 123.5 kg (272 lb 4.3 oz), SpO2 96.00%.   No significant abnormality.  Labs:   Lab Results  Component Value Date   WBC 8.3 09/01/2011   HGB 15.9 09/01/2011   HCT 44.0 09/01/2011   MCV 85.1 09/01/2011   PLT 252 09/01/2011     Lab 08/31/11 1203  NA 137  K 4.8  CL 100  CO2 25  BUN 14  CREATININE 0.79  CALCIUM 9.8  PROT 7.1  BILITOT 0.4  ALKPHOS 46  ALT 25  AST 25  GLUCOSE 104*   Lab Results  Component Value Date   CKTOTAL 52 09/01/2011   CKMB 2.4 09/01/2011   TROPONINI <0.30 09/01/2011    Lab Results  Component Value Date   CHOL 175 07/31/2011   Lab Results  Component Value Date   HDL 38* 07/31/2011   Lab Results  Component Value Date   LDLCALC 105* 07/31/2011   Lab Results  Component Value Date   TRIG 161* 07/31/2011   Lab Results  Component Value Date   CHOLHDL 4.6 07/31/2011   No results found for this basename: LDLDIRECT      Radiology: No abnormality EKG: normal  FOLLOW UP PLANS AND APPOINTMENTS  Medication List  As of 09/02/2011  8:32 AM   STOP taking these medications         isosorbide mononitrate 60 MG 24 hr tablet           TAKE these medications         amLODipine 10 MG tablet   Commonly known as: NORVASC   Take 10 mg by mouth daily.      aspirin 325 MG EC tablet   Take 1 tablet (325 mg total) by mouth daily.      atorvastatin 40 MG tablet   Commonly known as: LIPITOR   Take 1 tablet (40 mg total) by mouth daily at 6 PM.      carvedilol 25 MG tablet   Commonly known as: COREG   Take 1 tablet (25 mg total) by mouth 2 (two) times daily with a meal.      clopidogrel 75 MG tablet   Commonly known as: PLAVIX   Take 1 tablet (75 mg total) by mouth daily with breakfast.      DULoxetine 60 MG capsule   Commonly known as: CYMBALTA   Take 1 capsule (60 mg total) by mouth daily.      hydrochlorothiazide 12.5 MG capsule   Commonly known as: MICROZIDE   Take 1 capsule (12.5 mg total) by mouth daily.      LORazepam 0.5 MG tablet   Commonly known as: ATIVAN   Take 1 tablet (0.5 mg total) by mouth every 8 (eight) hours  as needed for anxiety.      losartan 100 MG tablet   Commonly known as: COZAAR   Take 100 mg by mouth daily.      metFORMIN 500 MG 24 hr tablet   Commonly known as: GLUCOPHAGE-XR   Take 2,000 mg by mouth 2 (two) times daily.      nabumetone 750 MG tablet   Commonly known as: RELAFEN   Take 750 mg by mouth 2 (two) times daily.      nitroGLYCERIN 0.4 MG SL tablet   Commonly known as: NITROSTAT   Place 0.4 mg under the tongue every 5 (five) minutes as needed. For chest pain      Vitamin D3 1000 UNITS Caps   Take 1 capsule by mouth daily.           Follow-up Information    Follow up with Lesleigh Noe, MD on 09/10/2011. (8:50 AM)    Contact information:   [address] [place] [address] [phone]          BRING ALL MEDICATIONS WITH YOU TO FOLLOW UP APPOINTMENTS  Time spent with patient to include physician time: 20 min Signed: Lesleigh Noe 09/02/2011, 8:32 AM

## 2011-09-02 NOTE — Progress Notes (Signed)
Discharge instructions and patient education complete. IV site d/c. Site WNL. Pt awaiting to be d/c home with family. Dion Saucier

## 2011-09-15 ENCOUNTER — Encounter (HOSPITAL_COMMUNITY): Payer: Self-pay | Admitting: Emergency Medicine

## 2011-09-15 ENCOUNTER — Emergency Department (HOSPITAL_COMMUNITY)
Admission: EM | Admit: 2011-09-15 | Discharge: 2011-09-15 | Disposition: A | Discharge status: Home / Self Care | Payer: 59 | Attending: Emergency Medicine | Admitting: Emergency Medicine

## 2011-09-15 ENCOUNTER — Emergency Department (HOSPITAL_COMMUNITY): Payer: 59

## 2011-09-15 DIAGNOSIS — N200 Calculus of kidney: Secondary | ICD-10-CM | POA: Insufficient documentation

## 2011-09-15 DIAGNOSIS — R109 Unspecified abdominal pain: Secondary | ICD-10-CM

## 2011-09-15 DIAGNOSIS — E119 Type 2 diabetes mellitus without complications: Secondary | ICD-10-CM | POA: Insufficient documentation

## 2011-09-15 DIAGNOSIS — I10 Essential (primary) hypertension: Secondary | ICD-10-CM | POA: Insufficient documentation

## 2011-09-15 DIAGNOSIS — I2510 Atherosclerotic heart disease of native coronary artery without angina pectoris: Secondary | ICD-10-CM | POA: Insufficient documentation

## 2011-09-15 DIAGNOSIS — R10819 Abdominal tenderness, unspecified site: Secondary | ICD-10-CM | POA: Insufficient documentation

## 2011-09-15 LAB — HEPATIC FUNCTION PANEL
Albumin: 4.2 g/dL (ref 3.5–5.2)
Total Bilirubin: 0.4 mg/dL (ref 0.3–1.2)
Total Protein: 7.2 g/dL (ref 6.0–8.3)

## 2011-09-15 LAB — POCT I-STAT, CHEM 8
BUN: 13 mg/dL (ref 6–23)
Calcium, Ion: 1.2 mmol/L (ref 1.12–1.32)
HCT: 41 % (ref 39.0–52.0)
Sodium: 138 mEq/L (ref 135–145)
TCO2: 29 mmol/L (ref 0–100)

## 2011-09-15 LAB — DIFFERENTIAL
Basophils Absolute: 0.1 10*3/uL (ref 0.0–0.1)
Basophils Relative: 1 % (ref 0–1)
Eosinophils Absolute: 0.3 10*3/uL (ref 0.0–0.7)
Monocytes Absolute: 0.5 10*3/uL (ref 0.1–1.0)
Neutro Abs: 5.5 10*3/uL (ref 1.7–7.7)
Neutrophils Relative %: 69 % (ref 43–77)

## 2011-09-15 LAB — CBC
MCH: 30.5 pg (ref 26.0–34.0)
MCHC: 35.9 g/dL (ref 30.0–36.0)
RDW: 13.5 % (ref 11.5–15.5)

## 2011-09-15 LAB — URINALYSIS, ROUTINE W REFLEX MICROSCOPIC
Bilirubin Urine: NEGATIVE
Hgb urine dipstick: NEGATIVE
Ketones, ur: NEGATIVE mg/dL
Nitrite: NEGATIVE
Protein, ur: NEGATIVE mg/dL
Specific Gravity, Urine: 1.015 (ref 1.005–1.030)
Urobilinogen, UA: 0.2 mg/dL (ref 0.0–1.0)

## 2011-09-15 LAB — OCCULT BLOOD, POC DEVICE: Fecal Occult Bld: NEGATIVE

## 2011-09-15 LAB — POCT I-STAT TROPONIN I: Troponin i, poc: 0 ng/mL (ref 0.00–0.08)

## 2011-09-15 MED ORDER — ONDANSETRON HCL 4 MG/2ML IJ SOLN
4.0000 mg | Freq: Once | INTRAMUSCULAR | Status: AC
  Administered 2011-09-15: 4 mg via INTRAVENOUS
  Filled 2011-09-15: qty 2

## 2011-09-15 MED ORDER — NAPROXEN 500 MG PO TABS: 500.0000 mg | ORAL_TABLET | Freq: Two times a day (BID) | ORAL | Status: DC

## 2011-09-15 MED ORDER — IOHEXOL 300 MG/ML  SOLN
20.0000 mL | Freq: Once | INTRAMUSCULAR | Status: AC | PRN
  Administered 2011-09-15: 20 mL via ORAL

## 2011-09-15 MED ORDER — DOCUSATE SODIUM 100 MG PO CAPS: 100.0000 mg | ORAL_CAPSULE | Freq: Two times a day (BID) | ORAL | Status: DC

## 2011-09-15 MED ORDER — OXYCODONE-ACETAMINOPHEN 5-325 MG PO TABS: 1.0000 | ORAL_TABLET | ORAL | Status: AC | PRN

## 2011-09-15 MED ORDER — IOHEXOL 300 MG/ML  SOLN
100.0000 mL | Freq: Once | INTRAMUSCULAR | Status: AC | PRN
  Administered 2011-09-15: 100 mL via INTRAVENOUS

## 2011-09-15 MED ORDER — DOCUSATE SODIUM 100 MG PO CAPS: 100.0000 mg | ORAL_CAPSULE | Freq: Two times a day (BID) | ORAL | Status: AC

## 2011-09-15 MED ORDER — MORPHINE SULFATE 4 MG/ML IJ SOLN
4.0000 mg | Freq: Once | INTRAMUSCULAR | Status: AC
  Administered 2011-09-15: 4 mg via INTRAVENOUS
  Filled 2011-09-15: qty 1

## 2011-09-15 NOTE — ED Provider Notes (Signed)
History     CSN: [id number]  Arrival date & time 09/15/11  0112   First MD Initiated Contact with Patient 09/15/11 0127      Chief Complaint  Patient presents with  . Abdominal Pain    (Consider location/radiation/quality/duration/timing/severity/associated sxs/prior treatment) HPI Comments: 66-year-old male with a history of recent coronary catheterization with stenting who presents with a complaint of left-sided abdominal pain. He states that he's been having this gradual onset, persistent, gradually worsening abdominal pain which is left lower quadrant, left midabdomen and left upper quadrant in location. He has had associated decreased appetite and dark colored stools but no change in consistency, no dysuria, no fevers chills nausea or vomiting. He has been placed on ciprofloxacin and metronidazole I his primary doctor but this has not seemed to help. Currently his pain is 8/10, dull and nonradiating. He denies chest pain or shortness of breath, denies fevers or coughing.  Patient is a 66 y.o. male presenting with abdominal pain. The history is provided by the patient and medical records.  Abdominal Pain The primary symptoms of the illness include abdominal pain. The primary symptoms of the illness do not include fever, shortness of breath, nausea, vomiting, diarrhea or dysuria.  Symptoms associated with the illness do not include chills or back pain.    Past Medical History  Diagnosis Date  . Coronary artery disease     cardiac cath 08/21/11:  pLAD 50%, mLAD 30-60%, mLAD 95%, OM1 99%, dPDA 95%, mRCA 70%, EF 70%.  The mLAD was tx with a Promus DES.  Attempts were made at PCI of the OM1 but this was unsuccessful mainly due to exacerbation of back pain  . Hypertension   . Type II diabetes mellitus   . Headache     "every now & then"  . DJD (degenerative joint disease)   . Arthritis   . Gout   . Kidney stones   . Tuberculosis   . Anxiety   . Depression   . GERD (gastroesophageal  reflux disease)     Past Surgical History  Procedure Date  . Cholecystectomy 1970's  . Shoulder arthroscopy w/ rotator cuff repair ~ 2011    left  . Anterior cervical decomp/discectomy fusion 04/2007    C4-5; C6-7  . Back surgery     "3 times; last time was screws in lower back 10/2008"  . Coronary angioplasty 1999  . Lithotripsy   . Pci 5/09/2-13    LAD    Family History  Problem Relation Age of Onset  . Cancer Father     History  Substance Use Topics  . Smoking status: Former Smoker    Types: Cigarettes    Quit date: 04/14/1966  . Smokeless tobacco: Current User    Types: Chew   Comment: "smoked a pack q 2-3 weeks"  . Alcohol Use: No      Review of Systems  Constitutional: Negative for fever and chills.  HENT: Negative for sore throat and neck pain.   Eyes: Negative for visual disturbance.  Respiratory: Negative for cough, chest tightness and shortness of breath.   Cardiovascular: Negative for chest pain, palpitations and leg swelling.  Gastrointestinal: Positive for abdominal pain. Negative for nausea, vomiting and diarrhea.  Genitourinary: Negative for dysuria and flank pain.  Musculoskeletal: Negative for back pain, joint swelling and gait problem.  Skin: Negative for rash.  Neurological: Negative for headaches.  Hematological: Negative for adenopathy.  All other systems reviewed and are negative.    Allergies  Sulfa antibiotics  Home Medications   Current Outpatient Rx  Name Route Sig Dispense Refill  . AMLODIPINE BESYLATE 10 MG PO TABS Oral Take 10 mg by mouth daily.    Marland Kitchen CARVEDILOL 25 MG PO TABS Oral Take 1 tablet (25 mg total) by mouth 2 (two) times daily with a meal. 60 tablet 11  . VITAMIN D3 1000 UNITS PO CAPS Oral Take 1 capsule by mouth daily.    Marland Kitchen CIPROFLOXACIN HCL 500 MG PO TABS Oral Take 500 mg by mouth 2 (two) times daily. Taking for diverticulitis    . CLOPIDOGREL BISULFATE 75 MG PO TABS Oral Take 1 tablet (75 mg total) by mouth daily  with breakfast. 30 tablet 11  . HYDROCHLOROTHIAZIDE 12.5 MG PO CAPS Oral Take 1 capsule (12.5 mg total) by mouth daily. 30 capsule 11  . LOSARTAN POTASSIUM 100 MG PO TABS Oral Take 100 mg by mouth daily.    Marland Kitchen METFORMIN HCL ER 500 MG PO TB24 Oral Take 2,000 mg by mouth 2 (two) times daily.    Marland Kitchen METRONIDAZOLE 500 MG PO TABS Oral Take 500 mg by mouth 3 (three) times daily. Taking for diverticulitis    . NABUMETONE 750 MG PO TABS Oral Take 750 mg by mouth 2 (two) times daily.    Marland Kitchen NITROGLYCERIN 0.4 MG SL SUBL Sublingual Place 0.4 mg under the tongue every 5 (five) minutes as needed. For chest pain    . DOCUSATE SODIUM 100 MG PO CAPS Oral Take 1 capsule (100 mg total) by mouth every 12 (twelve) hours. 30 capsule 0  . NAPROXEN 500 MG PO TABS Oral Take 1 tablet (500 mg total) by mouth 2 (two) times daily with a meal. 30 tablet 0  . OXYCODONE-ACETAMINOPHEN 5-325 MG PO TABS Oral Take 1 tablet by mouth every 4 (four) hours as needed for pain. May take 2 tablets PO q 6 hours for severe pain - Do not take with Tylenol as this tablet already contains tylenol 15 tablet 0    BP 142/68  Pulse 63  Temp(Src) 97.5 F (36.4 C) (Oral)  Resp 17  SpO2 100%  Physical Exam  Nursing note and vitals reviewed. Constitutional: He appears well-developed and well-nourished. No distress.  HENT:  Head: Normocephalic and atraumatic.  Mouth/Throat: Oropharynx is clear and moist. No oropharyngeal exudate.  Eyes: Conjunctivae and EOM are normal. Pupils are equal, round, and reactive to light. Right eye exhibits no discharge. Left eye exhibits no discharge. No scleral icterus.  Neck: Normal range of motion. Neck supple. No JVD present. No thyromegaly present.  Cardiovascular: Normal rate, regular rhythm, normal heart sounds and intact distal pulses.  Exam reveals no gallop and no friction rub.   No murmur heard. Pulmonary/Chest: Effort normal and breath sounds normal. No respiratory distress. He has no wheezes. He has no  rales.  Abdominal: Soft. Bowel sounds are normal. He exhibits no distension and no mass. There is no tenderness.       ttp to the L mid abd and LLQ, no guarding, non peritoneal, no R sided ttp.  No CVA ttp  Genitourinary:       No gross blood, nontender, no hemorrhoids, no fissures, stool in rectal vault is loose and green.  Musculoskeletal: Normal range of motion. He exhibits no edema and no tenderness.  Lymphadenopathy:    He has no cervical adenopathy.  Neurological: He is alert. Coordination normal.  Skin: Skin is warm and dry. No rash noted. No erythema.  Psychiatric: He  has a normal mood and affect. His behavior is normal.    ED Course  Procedures (including critical care time)  Labs Reviewed  CBC - Abnormal; Notable for the following:    HCT 38.7 (*)    All other components within normal limits  POCT I-STAT, CHEM 8 - Abnormal; Notable for the following:    Glucose, Bld 122 (*)    All other components within normal limits  DIFFERENTIAL  LIPASE, BLOOD  HEPATIC FUNCTION PANEL  OCCULT BLOOD, POC DEVICE  POCT I-STAT TROPONIN I  URINALYSIS, ROUTINE W REFLEX MICROSCOPIC  OCCULT BLOOD X 1 CARD TO LAB, STOOL   Ct Abdomen Pelvis W Contrast  09/15/2011  *RADIOLOGY REPORT*  Clinical Data: Left upper quadrant and left lower quadrant abdominal pain.  CT ABDOMEN AND PELVIS WITH CONTRAST  Technique:  Multidetector CT imaging of the abdomen and pelvis was performed following the standard protocol during bolus administration of intravenous contrast.  Contrast: 20mL OMNIPAQUE IOHEXOL 300 MG/ML  SOLN, OMNIPAQUE IOHEXOL 300 MG/ML  SOLN  Comparison: CT scan 11/07/2010.  Findings: The lung bases are clear.  The liver is unremarkable.  No focal lesions or intrahepatic biliary dilatation.  The gallbladder is surgically absent.  No common bile duct dilatation.  The pancreas is normal.  The spleen is normal in size.  No focal lesions.  The adrenal glands are normal and stable.  There are bilateral  renal calculi and a stable air or old left renal cyst.  No obstructing ureteral calculi or bladder calculi.  The bladder, prostate gland and seminal vesicles are unremarkable.  The stomach, duodenum, small bowel and colon are unremarkable.  No inflammatory changes or mass lesions.  Scattered colonic diverticulosis is noted.  No findings for acute diverticulitis. The aorta is normal in caliber.  No dissection.  The major branch vessels are patent.  Scattered atherosclerotic calcifications.  No mesenteric or retroperitoneal masses or adenopathy.  A few small scattered nodes are noted.  No significant bony findings.  Stable lumbar fusion changes.  IMPRESSION: 1.  No acute abdominal/pelvic findings, mass lesions or lymphadenopathy. 2.  Stable bilateral renal calculi.  No obstructing ureteral calculi or bladder calculi.  Original Report Authenticated By: P. Loralie Champagne, M.D.     1. Abdominal pain       MDM  Thoroughly the patient has 8/10 abdominal pain on the left side, this is very reproducible on exam and is focused in the left lower quadrant and left mid abdomen. I suspect that this is primarily abdominal and not cardiac related but due 2 recent stenting will obtain EKG and troponin in addition to other labs for the abdominal exam. CT scan pending to rule out diverticulitis, abscess or intra-abdominal hemorrhage as the patient is now on Plavix. Rectal exam showed some greenish-colored stool which was loose, no gross blood, Hemoccult ordered.  ED ECG REPORT   Date: 09/15/2011   Rate: 66  Rhythm: normal sinus rhythm  QRS Axis: normal  Intervals: normal  ST/T Wave abnormalities: normal  Conduction Disutrbances:none  Narrative Interpretation:   Old EKG Reviewed: No significant changes compared with 09/01/2011  Patient reexamined, soft abdomen, minimally tender on the left side. CT scan reviewed by myself and with the patient and he has been informed that he has cysts in the left kidney but  nothing new from prior CT scans. There is no signs of abscess, perforation or diverticulitis. I reviewed these with the patient and encouraged him to followup. His EKG is nonischemic,  troponin is normal, and other lab work is also normal including blood counts, electrolytes and a cardboard negative. Urinalysis is clear and hydrated. Patient encouraged to followup when necessary or in 2 days if still having symptoms.  Discharge Prescriptions include:  #1 Naprosyn  #2 Percocet  #3 Colace    Vida Roller, MD 09/15/11 [phone]

## 2011-09-15 NOTE — Discharge Instructions (Signed)
Your CT scan did not show a source of your pain, your blood work and urine test were all normal as well. Please followup with your doctor in the next 2 days if you're still having symptoms, return to the hospital immediately for severe or worsening pain, vomiting, fevers.  Naprosyn for pain, Percocet for severe pain and take Colace or another laxative if you're taking Percocet to prevent constipation.  You have been diagnosed with undifferentiated abdominal pain.  Abdominal pain can be caused by many things. Your caregiver evaluates the seriousness of your pain by an examination and possibly blood or urine tests and imaging (CT scan, x-rays, ultrasound). Many cases can be observed and treated at home after initial evaluation in the emergency department. Even though you are being discharged home, abdominal pain can be unpredictable. Therefore, you need a repeat exam if your pain does not resolve, returns, or worsens. Most patient's with abdominal pain do not need to be admitted to the hospital or have surgery, but serious problems like appendicitis and gallbladder attacks can start out as nonspecific pain. Many abdominal conditions cannot be diagnosed in 1 visit, so followup evaluations are very important.  Seek immediate medical attention if:  *The pain does not go away or becomes severe. *Temperature above 101 develops *Repeated vomiting occurs(multiple episodes) *The pain becomes localized to portions of the abdomen. The right side could possibly be appendicitis. In an adult, the left lower portion of the abdomen could be colitis or diverticulitis. *Blood is being passed in stools or vomit *Return also if you develop chest pain, difficulty breathing, dizziness or fainting, or become confused poorly responsive or inconsolable (young children).     If you do not have a physician, you should reference the below phone numbers and call in the morning to establish follow up care.  RESOURCE  GUIDE  Dental Problems  Patients with Medicaid: [facility] [phone] [address]                                           [phone] [address] Phone:  [phone]                                                  Phone:  [phone]  If unable to pay or uninsured, contact:  [facility] or [facility]. to become qualified for the adult dental clinic.  Chronic Pain Problems Contact [facility]  [phone] Patients need to be referred by their primary care doctor.  Insufficient Money for Medicine Contact [employer]:  call "211" or [facility] [phone].  No Primary Care Doctor Call [facility]  [phone] Other agencies that provide inexpensive medical care    [facility]  [phone]    [facility]  [phone]    [facility]  [phone]    [facility]  [phone]    [facility]  [phone]    [facility]  [phone]  Psychological Services [facility]  [phone] [facility]  [phone] [facility]   800 [phone] (  emergency services [phone])  Substance Abuse Resources [facility]  [phone] [facility] [phone] [facility] [place] [phone] [facility] [phone] Residential & Outpatient Substance Abuse Program  [phone]  Abuse/Neglect [facility] Child Abuse Hotline 816-858-5938 [facility] Child Abuse Hotline 240-334-4332 (After Hours)  Emergency Shelter [facility] Minist[facility] [phone]  Maternity Homes [facility] [place] [facility] [phone] [facility] 6392120571  MRSA Hotline #:   6305668996    [facility] Resources  [facility]     [employer]                          [facility] Dept. [address] [place]                       [address]       [address]  Blondell Reveal Phone:  [phone]                                   Phone:  [phone]                 Phone:  [phone]  [facility] Phone:  [phone]  [facility] Child Abuse Hotline 670-469-6774 [phone] (After Hours)

## 2011-09-15 NOTE — ED Notes (Signed)
C/o L sided abd pain that radiates up L side with nausea and diaphoresis x 3 weeks.  Pt appears sob at present, but denies being sob.  Pt refused EKG at triage.

## 2011-09-15 NOTE — ED Notes (Signed)
Pt does not want EKG done he said "i dont think that's the problem I wish you wouldn't do that"

## 2011-09-21 ENCOUNTER — Emergency Department (HOSPITAL_COMMUNITY)
Admission: EM | Admit: 2011-09-21 | Discharge: 2011-09-21 | Disposition: A | Discharge status: Home / Self Care | Payer: 59 | Attending: Emergency Medicine | Admitting: Emergency Medicine

## 2011-09-21 ENCOUNTER — Encounter (HOSPITAL_COMMUNITY): Payer: Self-pay | Admitting: *Deleted

## 2011-09-21 ENCOUNTER — Emergency Department (HOSPITAL_COMMUNITY): Payer: 59

## 2011-09-21 DIAGNOSIS — Z8739 Personal history of other diseases of the musculoskeletal system and connective tissue: Secondary | ICD-10-CM | POA: Insufficient documentation

## 2011-09-21 DIAGNOSIS — Z79899 Other long term (current) drug therapy: Secondary | ICD-10-CM | POA: Insufficient documentation

## 2011-09-21 DIAGNOSIS — R1012 Left upper quadrant pain: Secondary | ICD-10-CM | POA: Insufficient documentation

## 2011-09-21 DIAGNOSIS — R110 Nausea: Secondary | ICD-10-CM | POA: Insufficient documentation

## 2011-09-21 DIAGNOSIS — I2510 Atherosclerotic heart disease of native coronary artery without angina pectoris: Secondary | ICD-10-CM | POA: Insufficient documentation

## 2011-09-21 DIAGNOSIS — R109 Unspecified abdominal pain: Secondary | ICD-10-CM

## 2011-09-21 DIAGNOSIS — E119 Type 2 diabetes mellitus without complications: Secondary | ICD-10-CM | POA: Insufficient documentation

## 2011-09-21 DIAGNOSIS — F341 Dysthymic disorder: Secondary | ICD-10-CM | POA: Insufficient documentation

## 2011-09-21 DIAGNOSIS — N200 Calculus of kidney: Secondary | ICD-10-CM | POA: Insufficient documentation

## 2011-09-21 DIAGNOSIS — I10 Essential (primary) hypertension: Secondary | ICD-10-CM | POA: Insufficient documentation

## 2011-09-21 LAB — URINALYSIS, ROUTINE W REFLEX MICROSCOPIC
Glucose, UA: NEGATIVE mg/dL
Ketones, ur: NEGATIVE mg/dL
Leukocytes, UA: NEGATIVE
Specific Gravity, Urine: 1.011 (ref 1.005–1.030)
pH: 5.5 (ref 5.0–8.0)

## 2011-09-21 LAB — CBC
MCH: 30.1 pg (ref 26.0–34.0)
MCHC: 35.7 g/dL (ref 30.0–36.0)
MCV: 84.2 fL (ref 78.0–100.0)
Platelets: 197 10*3/uL (ref 150–400)
RDW: 13.3 % (ref 11.5–15.5)

## 2011-09-21 LAB — COMPREHENSIVE METABOLIC PANEL
AST: 19 U/L (ref 0–37)
Albumin: 4.5 g/dL (ref 3.5–5.2)
Alkaline Phosphatase: 45 U/L (ref 39–117)
Chloride: 98 mEq/L (ref 96–112)
Potassium: 3.8 mEq/L (ref 3.5–5.1)
Total Bilirubin: 0.4 mg/dL (ref 0.3–1.2)
Total Protein: 7.6 g/dL (ref 6.0–8.3)

## 2011-09-21 LAB — DIFFERENTIAL
Basophils Relative: 1 % (ref 0–1)
Eosinophils Absolute: 0.2 10*3/uL (ref 0.0–0.7)
Eosinophils Relative: 3 % (ref 0–5)
Lymphs Abs: 1.6 10*3/uL (ref 0.7–4.0)
Neutrophils Relative %: 70 % (ref 43–77)

## 2011-09-21 MED ORDER — IOHEXOL 300 MG/ML  SOLN
100.0000 mL | Freq: Once | INTRAMUSCULAR | Status: AC | PRN
  Administered 2011-09-21: 100 mL via INTRAVENOUS

## 2011-09-21 MED ORDER — HYDROMORPHONE HCL 2 MG PO TABS: 2.0000 mg | ORAL_TABLET | ORAL | Status: AC | PRN

## 2011-09-21 MED ORDER — METOCLOPRAMIDE HCL 10 MG PO TABS: 10.0000 mg | ORAL_TABLET | Freq: Four times a day (QID) | ORAL | Status: DC | PRN

## 2011-09-21 MED ORDER — LORAZEPAM 2 MG/ML IJ SOLN
1.0000 mg | Freq: Once | INTRAMUSCULAR | Status: AC
  Administered 2011-09-21: 1 mg via INTRAVENOUS

## 2011-09-21 MED ORDER — SODIUM CHLORIDE 0.9 % IV SOLN
Freq: Once | INTRAVENOUS | Status: AC
  Administered 2011-09-21: 15:00:00 via INTRAVENOUS

## 2011-09-21 MED ORDER — IOHEXOL 300 MG/ML  SOLN
20.0000 mL | INTRAMUSCULAR | Status: AC
  Administered 2011-09-21 (×2): 20 mL via ORAL

## 2011-09-21 MED ORDER — ONDANSETRON HCL 4 MG/2ML IJ SOLN
4.0000 mg | Freq: Once | INTRAMUSCULAR | Status: AC
  Administered 2011-09-21: 4 mg via INTRAVENOUS
  Filled 2011-09-21: qty 2

## 2011-09-21 MED ORDER — LORAZEPAM 2 MG/ML IJ SOLN
INTRAMUSCULAR | Status: AC
  Filled 2011-09-21: qty 1

## 2011-09-21 MED ORDER — HYDROMORPHONE HCL PF 1 MG/ML IJ SOLN
1.0000 mg | Freq: Once | INTRAMUSCULAR | Status: AC
  Administered 2011-09-21: 1 mg via INTRAVENOUS
  Filled 2011-09-21: qty 1

## 2011-09-21 NOTE — ED Notes (Signed)
Refuses pain medication at this time.

## 2011-09-21 NOTE — Discharge Instructions (Signed)
Your workup has not given an explanation for what is causing your pain. I do not feel that she had diverticulitis and she did not get better with the antibiotic and 2 CAT scans failed to show evidence of diverticulitis. Please make a followup appointment with your gastroenterologist for further evaluation. Return to the emergency department if pain is not adequately controlled at home, if you start running a fever, or if you or vomiting in spite of the nausea medicine.  Abdominal Pain Abdominal pain can be caused by many things. Your caregiver decides the seriousness of your pain by an examination and possibly blood tests and X-rays. Many cases can be observed and treated at home. Most abdominal pain is not caused by a disease and will probably improve without treatment. However, in many cases, more time must pass before a clear cause of the pain can be found. Before that point, it may not be known if you need more testing, or if hospitalization or surgery is needed. HOME CARE INSTRUCTIONS   Do not take laxatives unless directed by your caregiver.   Take pain medicine only as directed by your caregiver.   Only take over-the-counter or prescription medicines for pain, discomfort, or fever as directed by your caregiver.   Try a clear liquid diet (broth, tea, or water) for as long as directed by your caregiver. Slowly move to a bland diet as tolerated.  SEEK IMMEDIATE MEDICAL CARE IF:   The pain does not go away.   You have a fever.   You keep throwing up (vomiting).   The pain is felt only in portions of the abdomen. Pain in the right side could possibly be appendicitis. In an adult, pain in the left lower portion of the abdomen could be colitis or diverticulitis.   You pass bloody or black tarry stools.  MAKE SURE YOU:   Understand these instructions.   Will watch your condition.   Will get help right away if you are not doing well or get worse.  Document Released: 01/08/2005 Document  Revised: 03/20/2011 Document Reviewed: 11/17/2007 [facility] Patient Information 2012 [place], [place].  Hydromorphone tablets What is this medicine? HYDROMORPHONE (hye droe MOR fone) is a pain reliever. It is used to treat moderate to severe pain. This medicine may be used for other purposes; ask your health care provider or pharmacist if you have questions. What should I tell my health care provider before I take this medicine? They need to know if you have any of these conditions: -brain tumor -drug abuse or addiction -head injury -heart disease -frequently drink alcohol containing drinks -kidney disease or problems going to the bathroom -liver disease -lung disease, asthma, or breathing problems -mental problems -an allergic or unusual reaction to lactose, hydromorphone, other opioid analgesics, other medicines, sulfites, foods, dyes, or preservatives -pregnant or trying to get pregnant -breast-feeding How should I use this medicine? Take this medicine by mouth with a glass of water. If the medicine upsets your stomach, take it with food or milk. Follow the directions on the prescription label. Do not take more medicine than you are told to take. Talk to your pediatrician regarding the use of this medicine in children. Special care may be needed. Overdosage: If you think you have taken too much of this medicine contact a poison control center or emergency room at once. NOTE: This medicine is only for you. Do not share this medicine with others. What if I miss a dose? If you miss a dose, take  it as soon as you can. If it is almost time for your next dose, take only that dose. Do not take double or extra doses. What may interact with this medicine? -alcohol -antihistamines for allergy, cough and cold -medicines for anesthesia -medicines for depression, anxiety, or psychotic disturbances -medicines for sleep -muscle relaxants -naltrexone -narcotic medicines for pain -phenothiazines  like chlorpromazine, mesoridazine, prochlorperazine, thioridazine This list may not describe all possible interactions. Give your health care provider a list of all the medicines, herbs, non-prescription drugs, or dietary supplements you use. Also tell them if you smoke, drink alcohol, or use illegal drugs. Some items may interact with your medicine. What should I watch for while using this medicine? Tell your doctor or health care professional if your pain does not go away, if it gets worse, or if you have new or a different type of pain. You may develop tolerance to the medicine. Tolerance means that you will need a higher dose of the medicine for pain relief. Tolerance is normal and is expected if you take this medicine for a long time. Do not suddenly stop taking your medicine because you may develop a severe reaction. Your body becomes used to the medicine. This does NOT mean you are addicted. Addiction is a behavior related to getting and using a drug for a non-medical reason. If you have pain, you have a medical reason to take pain medicine. Your doctor will tell you how much medicine to take. If your doctor wants you to stop the medicine, the dose will be slowly lowered over time to avoid any side effects. You may get drowsy or dizzy. Do not drive, use machinery, or do anything that needs mental alertness until you know how this medicine affects you. Do not stand or sit up quickly, especially if you are an older patient. This reduces the risk of dizzy or fainting spells. Alcohol may interfere with the effect of this medicine. Avoid alcoholic drinks. This medicine will cause constipation. Try to have a bowel movement at least every 2 to 3 days. If you do not have a bowel movement for 3 days, call your doctor or health care professional. Your mouth may get dry. Chewing sugarless gum or sucking hard candy, and drinking plenty of water may help. Contact your doctor if the problem does not go away or is  severe. What side effects may I notice from receiving this medicine? Side effects that you should report to your doctor or health care professional as soon as possible: -allergic reactions like skin rash, itching or hives, swelling of the face, lips, or tongue -breathing problems -changes in vision -confusion -feeling faint or lightheaded, falls -seizures -slow or fast heartbeat -trouble passing urine or change in the amount of urine -trouble with balance, talking, walking Side effects that usually do not require medical attention (report to your doctor or health care professional if they continue or are bothersome): -difficulty sleeping -drowsiness -dry mouth -flushing -headache -itching -loss of appetite -nausea, vomiting This list may not describe all possible side effects. Call your doctor for medical advice about side effects. You may report side effects to FDA at 1-800-FDA-1088. Where should I keep my medicine? Keep out of the reach of children. This medicine can be abused. Keep your medicine in a safe place to protect it from theft. Do not share this medicine with anyone. Selling or giving away this medicine is dangerous and against the law. Store at room temperature between 15 and 30 degrees C (  59 and 86 degrees F). Keep container tightly closed. Protect from light. Flush any unused medicines down the toilet. Do not use the medicine after the expiration date. NOTE: This sheet is a summary. It may not cover all possible information. If you have questions about this medicine, talk to your doctor, pharmacist, or health care provider.  2012, [employer]. (02/25/2008 10:24:26 AM)  Metoclopramide tablets What is this medicine? METOCLOPRAMIDE (met oh kloe PRA mide) is used to treat the symptoms of gastroesophageal reflux disease (GERD) like heartburn. It is also used to treat people with slow emptying of the stomach and intestinal tract. This medicine may be used for other  purposes; ask your health care provider or pharmacist if you have questions. What should I tell my health care provider before I take this medicine? They need to know if you have any of these conditions: -breast cancer -depression -diabetes -heart failure -high blood pressure -kidney disease -liver disease -Parkinson's disease or a movement disorder -pheochromocytoma -seizures -stomach obstruction, bleeding, or perforation -an unusual or allergic reaction to metoclopramide, procainamide, sulfites, other medicines, foods, dyes, or preservatives -pregnant or trying to get pregnant -breast-feeding How should I use this medicine? Take this medicine by mouth with a glass of water. Follow the directions on the prescription label. Take this medicine on an empty stomach, about 30 minutes before eating. Take your doses at regular intervals. Do not take your medicine more often than directed. Do not stop taking except on the advice of your doctor or health care professional. A special MedGuide will be given to you by the pharmacist with each prescription and refill. Be sure to read this information carefully each time. Talk to your pediatrician regarding the use of this medicine in children. Special care may be needed. Overdosage: If you think you have taken too much of this medicine contact a poison control center or emergency room at once. NOTE: This medicine is only for you. Do not share this medicine with others. What if I miss a dose? If you miss a dose, take it as soon as you can. If it is almost time for your next dose, take only that dose. Do not take double or extra doses. What may interact with this medicine? -acetaminophen -cyclosporine -digoxin -medicines for blood pressure -medicines for diabetes, including insulin -medicines for hay fever and other allergies -medicines for depression, especially an Monoamine Oxidase Inhibitor (MAOI) -medicines for Parkinson's disease, like  levodopa -medicines for sleep or for pain -tetracycline This list may not describe all possible interactions. Give your health care provider a list of all the medicines, herbs, non-prescription drugs, or dietary supplements you use. Also tell them if you smoke, drink alcohol, or use illegal drugs. Some items may interact with your medicine. What should I watch for while using this medicine? It may take a few weeks for your stomach condition to start to get better. However, do not take this medicine for longer than 12 weeks. The longer you take this medicine, and the more you take it, the greater your chances are of developing serious side effects. If you are an elderly patient, a male patient, or you have diabetes, you may be at an increased risk for side effects from this medicine. Contact your doctor immediately if you start having movements you cannot control such as lip smacking, rapid movements of the tongue, involuntary or uncontrollable movements of the eyes, head, arms and legs, or muscle twitches and spasms. Patients and their families should watch out  for worsening depression or thoughts of suicide. Also watch out for any sudden or severe changes in feelings such as feeling anxious, agitated, panicky, irritable, hostile, aggressive, impulsive, severely restless, overly excited and hyperactive, or not being able to sleep. If this happens, especially at the beginning of treatment or after a change in dose, call your doctor. Do not treat yourself for high fever. Ask your doctor or health care professional for advice. You may get drowsy or dizzy. Do not drive, use machinery, or do anything that needs mental alertness until you know how this drug affects you. Do not stand or sit up quickly, especially if you are an older patient. This reduces the risk of dizzy or fainting spells. Alcohol can make you more drowsy and dizzy. Avoid alcoholic drinks. What side effects may I notice from receiving this  medicine? Side effects that you should report to your doctor or health care professional as soon as possible: -allergic reactions like skin rash, itching or hives, swelling of the face, lips, or tongue -abnormal production of milk in females -breast enlargement in both males and females -change in the way you walk -difficulty moving, speaking or swallowing -drooling, lip smacking, or rapid movements of the tongue -excessive sweating -fever -involuntary or uncontrollable movements of the eyes, head, arms and legs -irregular heartbeat or palpitations -muscle twitches and spasms -unusually weak or tired Side effects that usually do not require medical attention (report to your doctor or health care professional if they continue or are bothersome): -change in sex drive or performance -depressed mood -diarrhea -difficulty sleeping -headache -menstrual changes -restless or nervous This list may not describe all possible side effects. Call your doctor for medical advice about side effects. You may report side effects to FDA at 1-800-FDA-1088. Where should I keep my medicine? Keep out of the reach of children. Store at room temperature between 20 and 25 degrees C (68 and 77 degrees F). Protect from light. Keep container tightly closed. Throw away any unused medicine after the expiration date. NOTE: This sheet is a summary. It may not cover all possible information. If you have questions about this medicine, talk to your doctor, pharmacist, or health care provider.  2012, [employer]. (11/24/2007 4:30:05 PM)

## 2011-09-21 NOTE — ED Notes (Signed)
Reports severe LLQ pain for extended amount of time, has been seen for same. Went to an [facility] today and told to come back here for possible perf bowel. Having nausea, no vomiting or diarrhea.

## 2011-09-21 NOTE — ED Notes (Signed)
Patient not complaining of pain.  Verbalized understanding of discharge instructions.

## 2011-09-21 NOTE — ED Provider Notes (Signed)
History     CSN: [id number]  Arrival date & time 09/21/11  1132   First MD Initiated Contact with Patient 09/21/11 1159      Chief Complaint  Patient presents with  . Abdominal Pain  . Nausea    (Consider location/radiation/quality/duration/timing/severity/associated sxs/prior treatment) Patient is a 66 y.o. male presenting with abdominal pain. The history is provided by the patient.  Abdominal Pain The primary symptoms of the illness include abdominal pain.  He has been having left upper quadrant pain for the last month. Pain has been waxing and waning but generally getting worse. It was so severe last night that he was unable to sleep. He was seen in the emergency department last week and had a negative CT scan. He went to urgent care today where he was advised to come to the emergency department to rule out bowel obstruction. He does have Percocet at home but is reluctant to take it because he is concerned about  Constipation. He has not had any fever or sweats but he has had some chills. There's been nausea without vomiting or diarrhea. He denies any dysuria. He has had a colonoscopy in the last several years but does not recall being told anything about diverticulosis. Of note, he was started on ciprofloxacin and metronidazole by his PCP on May 31, and symptoms are worsening in spite of that.  Past Medical History  Diagnosis Date  . Coronary artery disease     cardiac cath 08/21/11:  pLAD 50%, mLAD 30-60%, mLAD 95%, OM1 99%, dPDA 95%, mRCA 70%, EF 70%.  The mLAD was tx with a Promus DES.  Attempts were made at PCI of the OM1 but this was unsuccessful mainly due to exacerbation of back pain  . Hypertension   . Type II diabetes mellitus   . Headache     "every now & then"  . DJD (degenerative joint disease)   . Arthritis   . Gout   . Kidney stones   . Tuberculosis   . Anxiety   . Depression   . GERD (gastroesophageal reflux disease)     Past Surgical History  Procedure Date    . Cholecystectomy 1970's  . Shoulder arthroscopy w/ rotator cuff repair ~ 2011    left  . Anterior cervical decomp/discectomy fusion 04/2007    C4-5; C6-7  . Back surgery     "3 times; last time was screws in lower back 10/2008"  . Coronary angioplasty 1999  . Lithotripsy   . Pci 5/09/2-13    LAD    Family History  Problem Relation Age of Onset  . Cancer Father     History  Substance Use Topics  . Smoking status: Former Smoker    Types: Cigarettes    Quit date: 04/14/1966  . Smokeless tobacco: Current User    Types: Chew   Comment: "smoked a pack q 2-3 weeks"  . Alcohol Use: No      Review of Systems  Gastrointestinal: Positive for abdominal pain.  All other systems reviewed and are negative.    Allergies  Sulfa antibiotics  Home Medications   Current Outpatient Rx  Name Route Sig Dispense Refill  . AMLODIPINE BESYLATE 10 MG PO TABS Oral Take 10 mg by mouth daily.    Marland Kitchen CARVEDILOL 25 MG PO TABS Oral Take 1 tablet (25 mg total) by mouth 2 (two) times daily with a meal. 60 tablet 11  . VITAMIN D3 1000 UNITS PO CAPS Oral Take 1 capsule  by mouth daily.    Marland Kitchen CIPROFLOXACIN HCL 500 MG PO TABS Oral Take 500 mg by mouth 2 (two) times daily. Taking for diverticulitis for 10 days starting 09/12/11    . CLOPIDOGREL BISULFATE 75 MG PO TABS Oral Take 1 tablet (75 mg total) by mouth daily with breakfast. 30 tablet 11  . DOCUSATE SODIUM 100 MG PO CAPS Oral Take 1 capsule (100 mg total) by mouth every 12 (twelve) hours. 30 capsule 0  . GLYCOPYRROLATE 2 MG PO TABS Oral Take 2 mg by mouth 3 (three) times daily as needed. For spasms    . HYDROCHLOROTHIAZIDE 12.5 MG PO CAPS Oral Take 1 capsule (12.5 mg total) by mouth daily. 30 capsule 11  . LOSARTAN POTASSIUM 100 MG PO TABS Oral Take 100 mg by mouth daily.    Marland Kitchen METFORMIN HCL ER 500 MG PO TB24 Oral Take 2,000 mg by mouth 2 (two) times daily.    Marland Kitchen METRONIDAZOLE 500 MG PO TABS Oral Take 500 mg by mouth 3 (three) times daily. Taking for  diverticulitis for 10 days starting 09/12/11    . NABUMETONE 750 MG PO TABS Oral Take 750 mg by mouth 2 (two) times daily.    Marland Kitchen NAPROXEN 500 MG PO TABS Oral Take 1 tablet (500 mg total) by mouth 2 (two) times daily with a meal. 30 tablet 0  . NITROGLYCERIN 0.4 MG SL SUBL Sublingual Place 0.4 mg under the tongue every 5 (five) minutes as needed. For chest pain    . OXYCODONE-ACETAMINOPHEN 5-325 MG PO TABS Oral Take 1 tablet by mouth every 4 (four) hours as needed for pain. May take 2 tablets PO q 6 hours for severe pain - Do not take with Tylenol as this tablet already contains tylenol 15 tablet 0    BP 146/81  Pulse 65  Temp(Src) 97.5 F (36.4 C) (Oral)  Resp 19  SpO2 99%  Physical Exam  Nursing note and vitals reviewed.  66 year old male who is resting comfortably and in no acute distress. Vital signs are significant for mild hypertension with blood pressure 146/81. Oxygen saturation is 99% which is normal. Head is normal cephalic and atraumatic. PERRLA, EOMI. There is no scleral icterus. Oropharynx is clear. Neck is nontender and supple without adenopathy. Back is nontender there's no CVA tenderness. Lungs are clear without rales, wheezes, rhonchi. Heart has regular rate and rhythm without murmur. Abdomen is soft, flat, with moderate tenderness in the left lower quadrant without rebound or guarding. Tenderness is fairly well localized. There no masses or hepatosplenomegaly. Peristalsis is present. Extremities have no cyanosis or edema, full range of motion is present. Skin is warm and dry without rash. Neurologic: Mental status is normal, cranial nerves are intact, there are no motor or sensory deficits.  ED Course  Procedures (including critical care time)  Results for orders placed during the hospital encounter of 09/21/11  CBC      Component Value Range   WBC 7.7  4.0 - 10.5 (K/uL)   RBC 4.99  4.22 - 5.81 (MIL/uL)   Hemoglobin 15.0  13.0 - 17.0 (g/dL)   HCT 96.0  45.4 - 09.8 (%)   MCV  84.2  78.0 - 100.0 (fL)   MCH 30.1  26.0 - 34.0 (pg)   MCHC 35.7  30.0 - 36.0 (g/dL)   RDW 11.9  14.7 - 82.9 (%)   Platelets 197  150 - 400 (K/uL)  DIFFERENTIAL      Component Value Range   Neutrophils  Relative 70  43 - 77 (%)   Neutro Abs 5.4  1.7 - 7.7 (K/uL)   Lymphocytes Relative 21  12 - 46 (%)   Lymphs Abs 1.6  0.7 - 4.0 (K/uL)   Monocytes Relative 6  3 - 12 (%)   Monocytes Absolute 0.4  0.1 - 1.0 (K/uL)   Eosinophils Relative 3  0 - 5 (%)   Eosinophils Absolute 0.2  0.0 - 0.7 (K/uL)   Basophils Relative 1  0 - 1 (%)   Basophils Absolute 0.0  0.0 - 0.1 (K/uL)  COMPREHENSIVE METABOLIC PANEL      Component Value Range   Sodium 136  135 - 145 (mEq/L)   Potassium 3.8  3.5 - 5.1 (mEq/L)   Chloride 98  96 - 112 (mEq/L)   CO2 21  19 - 32 (mEq/L)   Glucose, Bld 112 (*) 70 - 99 (mg/dL)   BUN 11  6 - 23 (mg/dL)   Creatinine, Ser 7.82  0.50 - 1.35 (mg/dL)   Calcium 95.6  8.4 - 10.5 (mg/dL)   Total Protein 7.6  6.0 - 8.3 (g/dL)   Albumin 4.5  3.5 - 5.2 (g/dL)   AST 19  0 - 37 (U/L)   ALT 24  0 - 53 (U/L)   Alkaline Phosphatase 45  39 - 117 (U/L)   Total Bilirubin 0.4  0.3 - 1.2 (mg/dL)   GFR calc non Af Amer >90  >90 (mL/min)   GFR calc Af Amer >90  >90 (mL/min)  LIPASE, BLOOD      Component Value Range   Lipase 23  11 - 59 (U/L)  URINALYSIS, ROUTINE W REFLEX MICROSCOPIC      Component Value Range   Color, Urine YELLOW  YELLOW    APPearance CLEAR  CLEAR    Specific Gravity, Urine 1.011  1.005 - 1.030    pH 5.5  5.0 - 8.0    Glucose, UA NEGATIVE  NEGATIVE (mg/dL)   Hgb urine dipstick NEGATIVE  NEGATIVE    Bilirubin Urine NEGATIVE  NEGATIVE    Ketones, ur NEGATIVE  NEGATIVE (mg/dL)   Protein, ur NEGATIVE  NEGATIVE (mg/dL)   Urobilinogen, UA 0.2  0.0 - 1.0 (mg/dL)   Nitrite NEGATIVE  NEGATIVE    Leukocytes, UA NEGATIVE  NEGATIVE     Ct Abdomen Pelvis W Contrast  09/21/2011  *RADIOLOGY REPORT*  Clinical Data: Abdominal pain, nausea  CT ABDOMEN AND PELVIS WITH CONTRAST   Technique:  Multidetector CT imaging of the abdomen and pelvis was performed following the standard protocol during bolus administration of intravenous contrast.  Contrast: OMNIPAQUE IOHEXOL 300 MG/ML  SOLN, 1 OMNIPAQUE IOHEXOL 300 MG/ML  SOLN  Comparison: 09/15/2011  Findings: Lung bases are unremarkable.  Sagittal images of the spine shows stable postsurgical changes at L4 and S1 level. The patient is status post cholecystectomy.  Liver, spleen, pancreas and adrenals are unremarkable.  Stable bilateral nonobstructive nephrolithiasis.  No hydronephrosis or hydroureter.  A cyst in upper pole of the left kidney measures 2.7 cm stable from prior exam.  Mild atherosclerotic calcifications of the abdominal aorta again noted.  No aortic aneurysm.  No small bowel obstruction.  No ascites or free air.  No adenopathy.  There is no pericecal inflammation.  Normal appendix is clearly visualized axial image 78.  Prostate gland and seminal vesicles are unremarkable.  The urinary bladder is under distended grossly unremarkable.  No destructive bony lesions are noted within pelvis.  Delayed renal images  shows bilateral renal symmetrical excretion. Bilateral visualized proximal ureter is unremarkable.  IMPRESSION:  1.  No acute inflammatory process within abdomen or pelvis. 2.  Bilateral nonobstructive nephrolithiasis again noted.  No hydronephrosis or hydroureter. 3.  Normal appendix is clearly visualized. 4.  Bilateral renal symmetrical excretion.  Original Report Authenticated By: Natasha Mead, M.D.    Dg Abd Acute W/chest  09/21/2011  *RADIOLOGY REPORT*  Clinical Data: Abdominal pain and nausea  ACUTE ABDOMEN SERIES (ABDOMEN 2 VIEW & CHEST 1 VIEW)  Comparison: CT abdomen pelvis 09/15/2011 and chest radiograph 08/31/2011  Findings: Normal heart, mediastinal, and contours. Stable calcified granuloma lateral left lung.  No airspace disease, effusion, or pneumothorax.  No evidence of free intraperitoneal air.  Bowel gas  pattern is nonobstructive.  5 mm stone projects over the right kidney.  Probable punctate stone lower pole of the right kidney.  2 mm stone projects over the left kidney.  The patient has known nephrolithiasis demonstrated on CT abdomen pelvis of 09/15/2011.  No ureteral or bladder stone is identified.  There postsurgical changes of the lower lumbar spine with fusion at L4-S1.  IMPRESSION:  1.  Bilateral renal calculi appears stable in size and number compared to recent CT abdomen pelvis. 2.  Nonobstructive bowel gas pattern. 3.  No acute cardiopulmonary disease.  Original Report Authenticated By: Britta Mccreedy, M.D.      1. Abdominal pain       MDM  Abdominal pain definitely concerning for diverticulitis. I have reviewed his ED visit of June 3 and he indeed had normal CBC, normal urinalysis, normal chemistry panel, and normal CT scan. Laboratory workup and CT scan will be repeated. If he does have diverticulitis, consideration will need to be made for her hospitalization because of failure of outpatient treatment.   CT is negative for diverticulitis. There's not a clear cause for his pain. Need to consider possibility of herpes zoster with rash not yet evident. With normal WBC a normal workup, do not feel he needs hospital admission. He is discharged with a prescription for hydromorphone and is to followup with his gastroenterologist. He is to return if symptoms worsen.     Dione Booze, MD 09/22/11 [phone]

## 2011-10-23 ENCOUNTER — Encounter (HOSPITAL_COMMUNITY): Payer: Self-pay | Admitting: *Deleted

## 2011-10-23 ENCOUNTER — Emergency Department (HOSPITAL_COMMUNITY)
Admission: EM | Admit: 2011-10-23 | Discharge: 2011-10-23 | Disposition: A | Discharge status: Home / Self Care | Payer: 59 | Attending: Emergency Medicine | Admitting: Emergency Medicine

## 2011-10-23 DIAGNOSIS — K219 Gastro-esophageal reflux disease without esophagitis: Secondary | ICD-10-CM | POA: Insufficient documentation

## 2011-10-23 DIAGNOSIS — Z87442 Personal history of urinary calculi: Secondary | ICD-10-CM | POA: Insufficient documentation

## 2011-10-23 DIAGNOSIS — R109 Unspecified abdominal pain: Secondary | ICD-10-CM | POA: Insufficient documentation

## 2011-10-23 DIAGNOSIS — Z8611 Personal history of tuberculosis: Secondary | ICD-10-CM | POA: Insufficient documentation

## 2011-10-23 DIAGNOSIS — M109 Gout, unspecified: Secondary | ICD-10-CM | POA: Insufficient documentation

## 2011-10-23 DIAGNOSIS — Z79899 Other long term (current) drug therapy: Secondary | ICD-10-CM | POA: Insufficient documentation

## 2011-10-23 DIAGNOSIS — I10 Essential (primary) hypertension: Secondary | ICD-10-CM | POA: Insufficient documentation

## 2011-10-23 DIAGNOSIS — I2510 Atherosclerotic heart disease of native coronary artery without angina pectoris: Secondary | ICD-10-CM | POA: Insufficient documentation

## 2011-10-23 DIAGNOSIS — E119 Type 2 diabetes mellitus without complications: Secondary | ICD-10-CM | POA: Insufficient documentation

## 2011-10-23 LAB — COMPREHENSIVE METABOLIC PANEL
ALT: 15 U/L (ref 0–53)
Alkaline Phosphatase: 40 U/L (ref 39–117)
BUN: 12 mg/dL (ref 6–23)
CO2: 25 mEq/L (ref 19–32)
Chloride: 98 mEq/L (ref 96–112)
GFR calc Af Amer: >90 mL/min (ref >90)
Glucose, Bld: 147 mg/dL — ABNORMAL HIGH (ref 70–99)
Potassium: 3.8 mEq/L (ref 3.5–5.1)
Sodium: 136 mEq/L (ref 135–145)
Total Bilirubin: 0.5 mg/dL (ref 0.3–1.2)
Total Protein: 7.5 g/dL (ref 6.0–8.3)

## 2011-10-23 LAB — URINALYSIS, ROUTINE W REFLEX MICROSCOPIC
Bilirubin Urine: NEGATIVE
Leukocytes, UA: NEGATIVE
Nitrite: NEGATIVE
Specific Gravity, Urine: 1.022 (ref 1.005–1.030)
Urobilinogen, UA: 0.2 mg/dL (ref 0.0–1.0)
pH: 5.5 (ref 5.0–8.0)

## 2011-10-23 LAB — CBC WITH DIFFERENTIAL/PLATELET
Hemoglobin: 14.3 g/dL (ref 13.0–17.0)
Lymphocytes Relative: 22 % (ref 12–46)
Lymphs Abs: 1.4 10*3/uL (ref 0.7–4.0)
MCH: 30.6 pg (ref 26.0–34.0)
Monocytes Relative: 7 % (ref 3–12)
Neutro Abs: 4.4 10*3/uL (ref 1.7–7.7)
Neutrophils Relative %: 68 % (ref 43–77)
Platelets: 206 10*3/uL (ref 150–400)
RBC: 4.68 MIL/uL (ref 4.22–5.81)
WBC: 6.5 10*3/uL (ref 4.0–10.5)

## 2011-10-23 MED ORDER — ONDANSETRON HCL 4 MG PO TABS: 4.0000 mg | ORAL_TABLET | Freq: Four times a day (QID) | ORAL | Status: AC

## 2011-10-23 MED ORDER — ONDANSETRON HCL 4 MG/2ML IJ SOLN
4.0000 mg | Freq: Once | INTRAMUSCULAR | Status: DC
  Filled 2011-10-23: qty 2

## 2011-10-23 MED ORDER — MORPHINE SULFATE 4 MG/ML IJ SOLN
4.0000 mg | Freq: Once | INTRAMUSCULAR | Status: AC
  Administered 2011-10-23: 4 mg via INTRAVENOUS
  Filled 2011-10-23: qty 1

## 2011-10-23 MED ORDER — SODIUM CHLORIDE 0.9 % IV SOLN
Freq: Once | INTRAVENOUS | Status: AC
  Administered 2011-10-23: 08:00:00 via INTRAVENOUS

## 2011-10-23 MED ORDER — ONDANSETRON HCL 4 MG/2ML IJ SOLN
4.0000 mg | Freq: Once | INTRAMUSCULAR | Status: AC
  Administered 2011-10-23: 4 mg via INTRAVENOUS

## 2011-10-23 NOTE — ED Provider Notes (Signed)
History     CSN: [id number]  Arrival date & time 10/23/11  0701   First MD Initiated Contact with Patient 10/23/11 [phone]      Chief Complaint  Patient presents with  . Flank Pain    (Consider location/radiation/quality/duration/timing/severity/associated sxs/prior treatment) HPI Pt presents with right sided flank pain.  Pain is aching and moderate and constant since yesterday. He states "I am hurting in my kidney where these stones have been for 6 years". When asked to describe the pain he points to his right flank, then points to neck and top of head.  States that pain runs all the was from right flank up to the top of his head on the right side.  No fever/chills.  No neck pain.  No trauma or falls.  No vomiting, but some mild nausea.  Per chart review he was seen approx 1 month ago for left sided pain with normal CT scan.  Pt denies dysuria, has not seen blood in urine.  There are no other associated systemic symptoms, there are no other alleviating or modifying factors.   Past Medical History  Diagnosis Date  . Coronary artery disease     cardiac cath 08/21/11:  pLAD 50%, mLAD 30-60%, mLAD 95%, OM1 99%, dPDA 95%, mRCA 70%, EF 70%.  The mLAD was tx with a Promus DES.  Attempts were made at PCI of the OM1 but this was unsuccessful mainly due to exacerbation of back pain  . Hypertension   . Type II diabetes mellitus   . Headache     "every now & then"  . DJD (degenerative joint disease)   . Arthritis   . Gout   . Kidney stones   . Tuberculosis   . Anxiety   . Depression   . GERD (gastroesophageal reflux disease)     Past Surgical History  Procedure Date  . Cholecystectomy 1970's  . Shoulder arthroscopy w/ rotator cuff repair ~ 2011    left  . Anterior cervical decomp/discectomy fusion 04/2007    C4-5; C6-7  . Back surgery     "3 times; last time was screws in lower back 10/2008"  . Coronary angioplasty 1999  . Lithotripsy   . Pci 5/09/2-13    LAD    Family History    Problem Relation Age of Onset  . Cancer Father     History  Substance Use Topics  . Smoking status: Former Smoker    Types: Cigarettes    Quit date: 04/14/1966  . Smokeless tobacco: Current User    Types: Chew   Comment: "smoked a pack q 2-3 weeks"  . Alcohol Use: No      Review of Systems ROS reviewed and all otherwise negative except for mentioned in HPI  Allergies  Sulfa antibiotics  Home Medications   Current Outpatient Rx  Name Route Sig Dispense Refill  . ACETAMINOPHEN 500 MG PO TABS Oral Take 500 mg by mouth every 6 (six) hours as needed.    Marland Kitchen AMLODIPINE BESYLATE 10 MG PO TABS Oral Take 10 mg by mouth daily.    . ASPIRIN 325 MG PO TABS Oral Take 325 mg by mouth daily.    Marland Kitchen CARVEDILOL 25 MG PO TABS Oral Take 1 tablet (25 mg total) by mouth 2 (two) times daily with a meal. 60 tablet 11  . CLOPIDOGREL BISULFATE 75 MG PO TABS Oral Take 1 tablet (75 mg total) by mouth daily with breakfast. 30 tablet 11  . HYDROCHLOROTHIAZIDE 12.5 MG  PO CAPS Oral Take 1 capsule (12.5 mg total) by mouth daily. 30 capsule 11  . HYDROMORPHONE HCL 2 MG PO TABS Oral Take 2 mg by mouth every 4 (four) hours as needed.    Marland Kitchen LEVOTHYROXINE SODIUM 25 MCG PO TABS Oral Take 25 mcg by mouth daily.    Marland Kitchen LORAZEPAM 0.5 MG PO TABS Oral Take 0.5 mg by mouth every 8 (eight) hours.    Marland Kitchen LOSARTAN POTASSIUM 100 MG PO TABS Oral Take 100 mg by mouth daily.    Marland Kitchen METFORMIN HCL ER 500 MG PO TB24 Oral Take 2,000 mg by mouth 2 (two) times daily.    Marland Kitchen NABUMETONE 750 MG PO TABS Oral Take 750 mg by mouth 2 (two) times daily.    Marland Kitchen NITROGLYCERIN 0.4 MG SL SUBL Sublingual Place 0.4 mg under the tongue every 5 (five) minutes as needed. For chest pain    . POLYETHYLENE GLYCOL 3350 PO PACK Oral Take 17 g by mouth daily.    Marland Kitchen METOCLOPRAMIDE HCL 10 MG PO TABS Oral Take 1 tablet (10 mg total) by mouth every 6 (six) hours as needed (nausea). 30 tablet 0  . ONDANSETRON HCL 4 MG PO TABS Oral Take 1 tablet (4 mg total) by mouth every  6 (six) hours. 12 tablet 0    BP 113/56  Pulse 55  Temp 97.9 F (36.6 C) (Oral)  Resp 16  SpO2 100% Vitals reviewed Physical Exam Physical Examination: General appearance - alert, well appearing, and in no distress Mental status - alert, oriented to person, place, and time Eyes - pupils equal and reactive, no conjunctival injection, no scleral icterus Mouth - mucous membranes moist, pharynx normal without lesions Chest - clear to auscultation, no wheezes, rales or rhonchi, symmetric air entry Heart - normal rate, regular rhythm, normal S1, S2, no murmurs, rubs, clicks or gallops Abdomen - soft, nontender, nondistended, no masses or organomegaly Back exam - no midline c/t/l tenderness, no CVA tenderness Musculoskeletal - no joint tenderness, deformity or swelling Extremities - peripheral pulses normal, no pedal edema, no clubbing or cyanosis Skin - normal coloration and turgor, no rashes  ED Course  Procedures (including critical care time)  Labs Reviewed  URINALYSIS, ROUTINE W REFLEX MICROSCOPIC - Abnormal; Notable for the following:    APPearance HAZY (*)     All other components within normal limits  COMPREHENSIVE METABOLIC PANEL - Abnormal; Notable for the following:    Glucose, Bld 147 (*)     All other components within normal limits  CBC WITH DIFFERENTIAL  LAB REPORT - SCANNED   No results found.   1. Flank pain       MDM  Pt with normal labs, no blood or other abnormality in his urine.  He has had a recent CT scan.  I do not think one is clinically indicated today due to normal findings.  Pt discharged with strict return precautions, advised to arrange for followup with his PMD.          Ethelda Chick, MD 10/24/11 1357

## 2011-10-23 NOTE — ED Notes (Signed)
Pt state he is having right side flank pain. Pt states he has had nausea and emesis. Pt states he has had kidney stones in the past and feels like he having another one. Pt states he has had a kidney stones for 6 years and now it feels like it is trying to pass

## 2011-10-23 NOTE — ED Notes (Signed)
Pt states he has taken 2mg  of po dilaudid at home prior to coming to ed

## 2011-10-23 NOTE — ED Notes (Signed)
Pt c/o of right side flank pain that radiates up back into abd area. C/o of nausea/vomiting no diarrhea. Hx of kidney stones. "pain feels like he's has another one, feels like I'm about to pass it". States that he was suppose to see urology, but "hasnt had time".

## 2013-01-28 ENCOUNTER — Other Ambulatory Visit: Payer: Self-pay | Admitting: Physician Assistant

## 2013-01-28 ENCOUNTER — Ambulatory Visit
Admission: RE | Admit: 2013-01-28 | Discharge: 2013-01-28 | Disposition: A | Discharge status: Home / Self Care | Payer: No Typology Code available for payment source | Source: Ambulatory Visit | Attending: Physician Assistant | Admitting: Physician Assistant

## 2013-01-28 DIAGNOSIS — R109 Unspecified abdominal pain: Secondary | ICD-10-CM

## 2013-02-13 ENCOUNTER — Encounter: Payer: Self-pay | Admitting: Interventional Cardiology

## 2013-02-15 ENCOUNTER — Ambulatory Visit: Payer: 59 | Admitting: Interventional Cardiology

## 2013-02-23 ENCOUNTER — Encounter: Payer: Self-pay | Admitting: Interventional Cardiology

## 2013-03-14 ENCOUNTER — Ambulatory Visit (INDEPENDENT_AMBULATORY_CARE_PROVIDER_SITE_OTHER): Payer: Medicare Other | Admitting: Interventional Cardiology

## 2013-03-14 ENCOUNTER — Encounter: Payer: Self-pay | Admitting: Interventional Cardiology

## 2013-03-14 ENCOUNTER — Encounter (INDEPENDENT_AMBULATORY_CARE_PROVIDER_SITE_OTHER): Payer: Self-pay

## 2013-03-14 VITALS — BP 132/84 | HR 52 | Ht 69.0 in | Wt 251.0 lb

## 2013-03-14 DIAGNOSIS — I2510 Atherosclerotic heart disease of native coronary artery without angina pectoris: Secondary | ICD-10-CM

## 2013-03-14 DIAGNOSIS — I10 Essential (primary) hypertension: Secondary | ICD-10-CM

## 2013-03-14 DIAGNOSIS — E785 Hyperlipidemia, unspecified: Secondary | ICD-10-CM | POA: Diagnosis not present

## 2013-03-14 DIAGNOSIS — I25119 Atherosclerotic heart disease of native coronary artery with unspecified angina pectoris: Secondary | ICD-10-CM

## 2013-03-14 DIAGNOSIS — E119 Type 2 diabetes mellitus without complications: Secondary | ICD-10-CM | POA: Diagnosis not present

## 2013-03-14 DIAGNOSIS — I209 Angina pectoris, unspecified: Secondary | ICD-10-CM | POA: Diagnosis not present

## 2013-03-14 HISTORY — DX: Atherosclerotic heart disease of native coronary artery without angina pectoris: I25.10

## 2013-03-14 HISTORY — DX: Atherosclerotic heart disease of native coronary artery with unspecified angina pectoris: I25.119

## 2013-03-14 MED ORDER — CLOPIDOGREL BISULFATE 75 MG PO TABS: 75.0000 mg | ORAL_TABLET | Freq: Every day | ORAL | Status: DC

## 2013-03-14 NOTE — Patient Instructions (Signed)
Your physician recommends that you continue on your current medications as directed. Please refer to the Current Medication list given to you today.  Your physician wants you to follow-up in: 1 year You will receive a reminder letter in the mail two months in advance. If you don't receive a letter, please call our office to schedule the follow-up appointment.   Your physician discussed the importance of regular exercise and recommended that you start or continue a regular exercise program for good health.   

## 2013-03-14 NOTE — Progress Notes (Signed)
Patient ID: [name], male   DOB: 03-Apr-1946, 67 y.o.   MRN: [id number]    [address], [address] [place], [place]  [address] Phone: [phone] Fax:  [phone]  Date:  03/14/2013   ID:  [name], DOB 09-27-1945, MRN [id number]  PCP:  Arlyss Queen   ASSESSMENT:  1. Coronary artery disease with stable angina. The patient is status post mid LAD DES in May 2013. High-grade first obtuse marginal could not be opened. 2. Hyperlipidemia followed by primary care physician 3. Type 2 diabetes mellitus 4. History of depression and anxiety  PLAN:  1. Active lifestyle 2. Will refill the patient's Plavix which he should continue for at least another year 3. He should call if increasing angina or limitations   SUBJECTIVE: AYYAN SITES is a 67 y.o. male who is doing well. He is being followed by his primary care provider, Mr. Anna Genre, [place]. Chest pain episodes of been rare. Mild dyspnea on exertion. He has not had syncope but does have frequent episodes of vertigo with nausea and diaphoresis. He wears hearing aids. He denies orthopnea and PND. No lower extremity edema. Overall he feels well. Last coronary intervention was May 2013.   Wt Readings from Last 3 Encounters:  03/14/13 251 lb (113.853 kg)  09/02/11 272 lb 4.3 oz (123.5 kg)  08/22/11 270 lb 1 oz (122.5 kg)     Past Medical History  Diagnosis Date  . Coronary artery disease     cardiac cath 08/21/11:  pLAD 50%, mLAD 30-60%, mLAD 95%, OM1 99%, dPDA 95%, mRCA 70%, EF 70%.  The mLAD was tx with a Promus DES.  Attempts were made at PCI of the OM1 but this was unsuccessful mainly due to exacerbation of back pain  . Hypertension   . Type II diabetes mellitus   . Headache(784.0)     "every now & then"  . DJD (degenerative joint disease)   . Arthritis   . Gout   . Kidney stones   . Tuberculosis   . Anxiety   . Depression   . GERD (gastroesophageal reflux disease)     Current Outpatient Prescriptions    Medication Sig Dispense Refill  . amLODipine (NORVASC) 10 MG tablet Take 10 mg by mouth daily.      Marland Kitchen aspirin 81 MG tablet Take 81 mg by mouth daily.      Marland Kitchen atorvastatin (LIPITOR) 20 MG tablet Take 20 mg by mouth daily.      . carvedilol (COREG) 25 MG tablet Take 1 tablet (25 mg total) by mouth 2 (two) times daily with a meal.  60 tablet  11  . clopidogrel (PLAVIX) 75 MG tablet Take 1 tablet (75 mg total) by mouth daily with breakfast.  30 tablet  11  . dicyclomine (BENTYL) 10 MG/5ML syrup Take by mouth as needed.      . hydrochlorothiazide (MICROZIDE) 12.5 MG capsule Take 1 capsule (12.5 mg total) by mouth daily.  30 capsule  11  . HYDROmorphone (DILAUDID) 4 MG tablet Take by mouth every 4 (four) hours as needed for severe pain.      Marland Kitchen levothyroxine (SYNTHROID, LEVOTHROID) 25 MCG tablet Take 25 mcg by mouth daily.      Marland Kitchen LORazepam (ATIVAN) 1 MG tablet Take 1 mg by mouth every 8 (eight) hours.      Marland Kitchen losartan (COZAAR) 100 MG tablet Take 100 mg by mouth daily.      . metFORMIN (GLUCOPHAGE-XR) 500  MG 24 hr tablet Take 2,000 mg by mouth 2 (two) times daily.      . nitroGLYCERIN (NITROSTAT) 0.4 MG SL tablet Place 0.4 mg under the tongue every 5 (five) minutes as needed. For chest pain       No current facility-administered medications for this visit.    Allergies:    Allergies  Allergen Reactions  . Sulfa Antibiotics Rash    Social History:  The patient  reports that he quit smoking about 46 years ago. His smoking use included Cigarettes. He smoked 0.00 packs per day. His smokeless tobacco use includes Chew. He reports that he does not drink alcohol or use illicit drugs.   ROS:  Please see the history of present illness.   Frequent anxiety. Denies cough, weight loss, and syncope. Frequent vertiginous dizziness.   All other systems reviewed and negative.   OBJECTIVE: VS:  BP 132/84  Pulse 52  Ht 5\' 9"  (1.753 m)  Wt 251 lb (113.853 kg)  BMI 37.05 kg/m2 Well nourished, well developed, in  no acute distress, obese HEENT: normal Neck: JVD flat. Carotid bruit 2+ bilateral  Cardiac:  normal S1, S2; RRR; no murmur Lungs:  clear to auscultation bilaterally, no wheezing, rhonchi or rales Abd: soft, nontender, no hepatomegaly Ext: Edema  Trace bilateral . Pulses 2+  Skin: warm and dry Neuro:  CNs 2-12 intact, no focal abnormalities noted  EKG:  NSR       Signed, Darci Needle III, MD 03/14/2013 10:13 AM

## 2013-03-15 DIAGNOSIS — L280 Lichen simplex chronicus: Secondary | ICD-10-CM | POA: Diagnosis not present

## 2013-03-15 DIAGNOSIS — L981 Factitial dermatitis: Secondary | ICD-10-CM | POA: Diagnosis not present

## 2013-04-19 DIAGNOSIS — L280 Lichen simplex chronicus: Secondary | ICD-10-CM | POA: Diagnosis not present

## 2013-05-02 DIAGNOSIS — I2510 Atherosclerotic heart disease of native coronary artery without angina pectoris: Secondary | ICD-10-CM | POA: Diagnosis not present

## 2013-05-02 DIAGNOSIS — E119 Type 2 diabetes mellitus without complications: Secondary | ICD-10-CM | POA: Diagnosis not present

## 2013-05-02 DIAGNOSIS — F341 Dysthymic disorder: Secondary | ICD-10-CM | POA: Diagnosis not present

## 2013-05-02 DIAGNOSIS — E7800 Pure hypercholesterolemia, unspecified: Secondary | ICD-10-CM | POA: Diagnosis not present

## 2013-05-02 DIAGNOSIS — R1032 Left lower quadrant pain: Secondary | ICD-10-CM | POA: Diagnosis not present

## 2013-05-16 DIAGNOSIS — J029 Acute pharyngitis, unspecified: Secondary | ICD-10-CM | POA: Diagnosis not present

## 2013-06-15 DIAGNOSIS — M545 Low back pain, unspecified: Secondary | ICD-10-CM | POA: Diagnosis not present

## 2013-06-15 DIAGNOSIS — F341 Dysthymic disorder: Secondary | ICD-10-CM | POA: Diagnosis not present

## 2013-06-15 DIAGNOSIS — R1032 Left lower quadrant pain: Secondary | ICD-10-CM | POA: Diagnosis not present

## 2013-08-01 DIAGNOSIS — E7800 Pure hypercholesterolemia, unspecified: Secondary | ICD-10-CM | POA: Diagnosis not present

## 2013-08-01 DIAGNOSIS — I10 Essential (primary) hypertension: Secondary | ICD-10-CM | POA: Diagnosis not present

## 2013-08-01 DIAGNOSIS — E039 Hypothyroidism, unspecified: Secondary | ICD-10-CM | POA: Diagnosis not present

## 2013-08-01 DIAGNOSIS — I2510 Atherosclerotic heart disease of native coronary artery without angina pectoris: Secondary | ICD-10-CM | POA: Diagnosis not present

## 2013-08-01 DIAGNOSIS — Z9181 History of falling: Secondary | ICD-10-CM | POA: Diagnosis not present

## 2013-08-01 DIAGNOSIS — E119 Type 2 diabetes mellitus without complications: Secondary | ICD-10-CM | POA: Diagnosis not present

## 2013-09-07 DIAGNOSIS — R0789 Other chest pain: Secondary | ICD-10-CM | POA: Diagnosis not present

## 2013-09-07 DIAGNOSIS — R1032 Left lower quadrant pain: Secondary | ICD-10-CM | POA: Diagnosis not present

## 2013-09-13 DIAGNOSIS — J029 Acute pharyngitis, unspecified: Secondary | ICD-10-CM | POA: Diagnosis not present

## 2013-09-13 DIAGNOSIS — J019 Acute sinusitis, unspecified: Secondary | ICD-10-CM | POA: Diagnosis not present

## 2013-09-21 DIAGNOSIS — M545 Low back pain, unspecified: Secondary | ICD-10-CM | POA: Diagnosis not present

## 2013-09-21 DIAGNOSIS — K219 Gastro-esophageal reflux disease without esophagitis: Secondary | ICD-10-CM | POA: Diagnosis not present

## 2013-09-21 DIAGNOSIS — M67919 Unspecified disorder of synovium and tendon, unspecified shoulder: Secondary | ICD-10-CM | POA: Diagnosis not present

## 2013-09-21 DIAGNOSIS — Z1331 Encounter for screening for depression: Secondary | ICD-10-CM | POA: Diagnosis not present

## 2013-10-31 DIAGNOSIS — E119 Type 2 diabetes mellitus without complications: Secondary | ICD-10-CM | POA: Diagnosis not present

## 2013-10-31 DIAGNOSIS — R1032 Left lower quadrant pain: Secondary | ICD-10-CM | POA: Diagnosis not present

## 2013-10-31 DIAGNOSIS — E559 Vitamin D deficiency, unspecified: Secondary | ICD-10-CM | POA: Diagnosis not present

## 2013-10-31 DIAGNOSIS — F341 Dysthymic disorder: Secondary | ICD-10-CM | POA: Diagnosis not present

## 2013-10-31 DIAGNOSIS — Z125 Encounter for screening for malignant neoplasm of prostate: Secondary | ICD-10-CM | POA: Diagnosis not present

## 2013-10-31 DIAGNOSIS — K5900 Constipation, unspecified: Secondary | ICD-10-CM | POA: Diagnosis not present

## 2013-10-31 DIAGNOSIS — I2510 Atherosclerotic heart disease of native coronary artery without angina pectoris: Secondary | ICD-10-CM | POA: Diagnosis not present

## 2013-11-08 DIAGNOSIS — R197 Diarrhea, unspecified: Secondary | ICD-10-CM | POA: Diagnosis not present

## 2013-11-08 DIAGNOSIS — R1032 Left lower quadrant pain: Secondary | ICD-10-CM | POA: Diagnosis not present

## 2013-12-08 DIAGNOSIS — R1032 Left lower quadrant pain: Secondary | ICD-10-CM | POA: Diagnosis not present

## 2013-12-08 DIAGNOSIS — K589 Irritable bowel syndrome without diarrhea: Secondary | ICD-10-CM | POA: Diagnosis not present

## 2013-12-08 DIAGNOSIS — N200 Calculus of kidney: Secondary | ICD-10-CM | POA: Diagnosis not present

## 2013-12-12 DIAGNOSIS — R1032 Left lower quadrant pain: Secondary | ICD-10-CM | POA: Diagnosis not present

## 2013-12-12 DIAGNOSIS — N200 Calculus of kidney: Secondary | ICD-10-CM | POA: Diagnosis not present

## 2013-12-12 DIAGNOSIS — K589 Irritable bowel syndrome without diarrhea: Secondary | ICD-10-CM | POA: Diagnosis not present

## 2013-12-12 DIAGNOSIS — R197 Diarrhea, unspecified: Secondary | ICD-10-CM | POA: Diagnosis not present

## 2013-12-12 DIAGNOSIS — I2510 Atherosclerotic heart disease of native coronary artery without angina pectoris: Secondary | ICD-10-CM | POA: Diagnosis not present

## 2013-12-14 DIAGNOSIS — N200 Calculus of kidney: Secondary | ICD-10-CM | POA: Diagnosis not present

## 2013-12-14 DIAGNOSIS — G8929 Other chronic pain: Secondary | ICD-10-CM | POA: Diagnosis not present

## 2013-12-27 ENCOUNTER — Emergency Department (HOSPITAL_COMMUNITY): Payer: Medicare Other

## 2013-12-27 ENCOUNTER — Encounter (HOSPITAL_COMMUNITY): Payer: Self-pay | Admitting: Emergency Medicine

## 2013-12-27 ENCOUNTER — Observation Stay (HOSPITAL_COMMUNITY)
Admission: EM | Admit: 2013-12-27 | Discharge: 2013-12-28 | Disposition: A | Discharge status: Home / Self Care | Payer: Medicare Other | Attending: Interventional Cardiology | Admitting: Interventional Cardiology

## 2013-12-27 DIAGNOSIS — Z7902 Long term (current) use of antithrombotics/antiplatelets: Secondary | ICD-10-CM | POA: Insufficient documentation

## 2013-12-27 DIAGNOSIS — R0789 Other chest pain: Secondary | ICD-10-CM | POA: Diagnosis not present

## 2013-12-27 DIAGNOSIS — K589 Irritable bowel syndrome without diarrhea: Secondary | ICD-10-CM | POA: Insufficient documentation

## 2013-12-27 DIAGNOSIS — E785 Hyperlipidemia, unspecified: Secondary | ICD-10-CM | POA: Diagnosis not present

## 2013-12-27 DIAGNOSIS — E669 Obesity, unspecified: Secondary | ICD-10-CM | POA: Diagnosis not present

## 2013-12-27 DIAGNOSIS — R079 Chest pain, unspecified: Secondary | ICD-10-CM | POA: Diagnosis not present

## 2013-12-27 DIAGNOSIS — F411 Generalized anxiety disorder: Secondary | ICD-10-CM | POA: Insufficient documentation

## 2013-12-27 DIAGNOSIS — Z7982 Long term (current) use of aspirin: Secondary | ICD-10-CM | POA: Diagnosis not present

## 2013-12-27 DIAGNOSIS — F329 Major depressive disorder, single episode, unspecified: Secondary | ICD-10-CM | POA: Insufficient documentation

## 2013-12-27 DIAGNOSIS — K219 Gastro-esophageal reflux disease without esophagitis: Secondary | ICD-10-CM | POA: Insufficient documentation

## 2013-12-27 DIAGNOSIS — E119 Type 2 diabetes mellitus without complications: Secondary | ICD-10-CM | POA: Insufficient documentation

## 2013-12-27 DIAGNOSIS — F3289 Other specified depressive episodes: Secondary | ICD-10-CM | POA: Insufficient documentation

## 2013-12-27 DIAGNOSIS — Z87891 Personal history of nicotine dependence: Secondary | ICD-10-CM | POA: Diagnosis not present

## 2013-12-27 DIAGNOSIS — Z9861 Coronary angioplasty status: Secondary | ICD-10-CM | POA: Diagnosis not present

## 2013-12-27 DIAGNOSIS — I10 Essential (primary) hypertension: Secondary | ICD-10-CM | POA: Insufficient documentation

## 2013-12-27 DIAGNOSIS — I200 Unstable angina: Secondary | ICD-10-CM

## 2013-12-27 DIAGNOSIS — I2510 Atherosclerotic heart disease of native coronary artery without angina pectoris: Secondary | ICD-10-CM | POA: Diagnosis not present

## 2013-12-27 DIAGNOSIS — I25119 Atherosclerotic heart disease of native coronary artery with unspecified angina pectoris: Secondary | ICD-10-CM

## 2013-12-27 HISTORY — DX: Unstable angina: I20.0

## 2013-12-27 LAB — TROPONIN I

## 2013-12-27 LAB — CBC WITH DIFFERENTIAL/PLATELET
Basophils Absolute: 0 10*3/uL (ref 0.0–0.1)
Basophils Relative: 0 % (ref 0–1)
EOS ABS: 0.1 10*3/uL (ref 0.0–0.7)
Eosinophils Relative: 2 % (ref 0–5)
HCT: 36.7 % — ABNORMAL LOW (ref 39.0–52.0)
HEMOGLOBIN: 12.9 g/dL — AB (ref 13.0–17.0)
LYMPHS ABS: 1.6 10*3/uL (ref 0.7–4.0)
LYMPHS PCT: 23 % (ref 12–46)
MCH: 30.8 pg (ref 26.0–34.0)
MCHC: 35.1 g/dL (ref 30.0–36.0)
MCV: 87.6 fL (ref 78.0–100.0)
MONOS PCT: 8 % (ref 3–12)
Monocytes Absolute: 0.5 10*3/uL (ref 0.1–1.0)
NEUTROS PCT: 67 % (ref 43–77)
Neutro Abs: 4.6 10*3/uL (ref 1.7–7.7)
PLATELETS: 174 10*3/uL (ref 150–400)
RBC: 4.19 MIL/uL — AB (ref 4.22–5.81)
RDW: 13.7 % (ref 11.5–15.5)
WBC: 6.9 10*3/uL (ref 4.0–10.5)

## 2013-12-27 LAB — COMPREHENSIVE METABOLIC PANEL
ALK PHOS: 41 U/L (ref 39–117)
ALT: 19 U/L (ref 0–53)
ANION GAP: 13 (ref 5–15)
AST: 13 U/L (ref 0–37)
Albumin: 3.7 g/dL (ref 3.5–5.2)
BILIRUBIN TOTAL: 0.5 mg/dL (ref 0.3–1.2)
BUN: 20 mg/dL (ref 6–23)
CHLORIDE: 103 meq/L (ref 96–112)
CO2: 25 meq/L (ref 19–32)
Calcium: 9.4 mg/dL (ref 8.4–10.5)
Creatinine, Ser: 0.77 mg/dL (ref 0.50–1.35)
GLUCOSE: 119 mg/dL — AB (ref 70–99)
POTASSIUM: 4.4 meq/L (ref 3.7–5.3)
SODIUM: 141 meq/L (ref 137–147)
TOTAL PROTEIN: 6.8 g/dL (ref 6.0–8.3)

## 2013-12-27 LAB — I-STAT TROPONIN, ED: Troponin i, poc: 0 ng/mL (ref 0.00–0.08)

## 2013-12-27 MED ORDER — NITROGLYCERIN 0.4 MG SL SUBL: 0.4000 mg | SUBLINGUAL_TABLET | SUBLINGUAL | Status: DC | PRN

## 2013-12-27 MED ORDER — HYDROCHLOROTHIAZIDE 12.5 MG PO CAPS
12.5000 mg | ORAL_CAPSULE | Freq: Every day | ORAL | Status: DC
  Administered 2013-12-28: 12.5 mg via ORAL
  Filled 2013-12-27: qty 1

## 2013-12-27 MED ORDER — AMLODIPINE BESYLATE 10 MG PO TABS
10.0000 mg | ORAL_TABLET | Freq: Every day | ORAL | Status: DC
  Administered 2013-12-28: 10 mg via ORAL
  Filled 2013-12-27: qty 1

## 2013-12-27 MED ORDER — HEPARIN (PORCINE) IN NACL 100-0.45 UNIT/ML-% IJ SOLN
1350.0000 [IU]/h | INTRAMUSCULAR | Status: DC
  Administered 2013-12-27 – 2013-12-28 (×2): 1350 [IU]/h via INTRAVENOUS
  Filled 2013-12-27 (×3): qty 250

## 2013-12-27 MED ORDER — HEPARIN BOLUS VIA INFUSION
4000.0000 [IU] | Freq: Once | INTRAVENOUS | Status: AC
  Administered 2013-12-27: 4000 [IU] via INTRAVENOUS
  Filled 2013-12-27: qty 4000

## 2013-12-27 MED ORDER — LEVOTHYROXINE SODIUM 25 MCG PO TABS
25.0000 ug | ORAL_TABLET | Freq: Every day | ORAL | Status: DC
  Administered 2013-12-28: 25 ug via ORAL
  Filled 2013-12-27 (×2): qty 1

## 2013-12-27 MED ORDER — HYDROMORPHONE HCL 2 MG PO TABS: 1.0000 mg | ORAL_TABLET | ORAL | Status: DC | PRN

## 2013-12-27 MED ORDER — INSULIN ASPART 100 UNIT/ML ~~LOC~~ SOLN: 0.0000 [IU] | Freq: Three times a day (TID) | SUBCUTANEOUS | Status: DC

## 2013-12-27 MED ORDER — ASPIRIN EC 81 MG PO TBEC
81.0000 mg | DELAYED_RELEASE_TABLET | Freq: Every day | ORAL | Status: DC
  Administered 2013-12-28: 81 mg via ORAL
  Filled 2013-12-27: qty 1

## 2013-12-27 MED ORDER — ONDANSETRON HCL 4 MG/2ML IJ SOLN: 4.0000 mg | Freq: Four times a day (QID) | INTRAMUSCULAR | Status: DC | PRN

## 2013-12-27 MED ORDER — CLOPIDOGREL BISULFATE 75 MG PO TABS
75.0000 mg | ORAL_TABLET | Freq: Every day | ORAL | Status: DC
  Administered 2013-12-28: 75 mg via ORAL
  Filled 2013-12-27 (×2): qty 1

## 2013-12-27 MED ORDER — ACETAMINOPHEN 325 MG PO TABS: 650.0000 mg | ORAL_TABLET | ORAL | Status: DC | PRN

## 2013-12-27 MED ORDER — ATORVASTATIN CALCIUM 20 MG PO TABS
20.0000 mg | ORAL_TABLET | Freq: Every day | ORAL | Status: DC
  Administered 2013-12-28: 20 mg via ORAL
  Filled 2013-12-27: qty 1

## 2013-12-27 MED ORDER — FLUOXETINE HCL 20 MG PO CAPS
40.0000 mg | ORAL_CAPSULE | Freq: Every day | ORAL | Status: DC
  Administered 2013-12-28: 40 mg via ORAL
  Filled 2013-12-27: qty 2

## 2013-12-27 MED ORDER — CARVEDILOL 25 MG PO TABS
25.0000 mg | ORAL_TABLET | Freq: Two times a day (BID) | ORAL | Status: DC
  Administered 2013-12-28 (×2): 25 mg via ORAL
  Filled 2013-12-27 (×3): qty 1

## 2013-12-27 MED ORDER — LOSARTAN POTASSIUM 50 MG PO TABS
100.0000 mg | ORAL_TABLET | Freq: Every day | ORAL | Status: DC
  Administered 2013-12-28: 100 mg via ORAL
  Filled 2013-12-27: qty 2

## 2013-12-27 MED ORDER — HEPARIN BOLUS VIA INFUSION
5000.0000 [IU] | Freq: Once | INTRAVENOUS | Status: DC
Start: 2013-12-27 — End: 2013-12-27
  Filled 2013-12-27: qty 5000

## 2013-12-27 NOTE — ED Notes (Signed)
Cardiology at bedside.

## 2013-12-27 NOTE — ED Notes (Signed)
Cell phone for [name],  [phone]

## 2013-12-27 NOTE — ED Provider Notes (Signed)
68 year old male, history of coronary disease status post stenting 2 years ago as well as a history of IBS. He had onset of chest pain this morning with some radiation to the shoulder and arm. It took him aspirin and nitroglycerin with good relief and states that those symptoms have all but resolved except for a small amount of discomfort behind his left shoulder. He denies swelling of the legs but now has some discomfort in his left lower abdomen which he states is similar to his chronic IBS. On my exam he has clear heart lung sounds without peripheral edema or JVD, soft abdomen with mild tenderness to the left lower quadrant. EKG shows no signs of acute ischemia but the patient is high risk for coronary disease and has improved with nitroglycerin he will likely need evaluation including cardiology evaluation in the emergency department. Doubt more significant abdominal process as this is a chronic abdominal discomfort that he has at this time.  Pt admitted to cardiology service, d/w cardiology who will see in ED.   EKG Interpretation  Date/Time:  Tuesday December 27 2013 17:53:07 EDT Ventricular Rate:  54 PR Interval:  142 QRS Duration: 93 QT Interval:  442 QTC Calculation: 419 R Axis:   20 Text Interpretation:  Sinus bradycardia ECG OTHERWISE WITHIN NORMAL LIMITS Since last tracing rate slower Confirmed by Abdel Effinger  MD, Asya Derryberry ([id number]) on 12/27/2013 5:59:41 PM      Medical screening examination/treatment/procedure(s) were conducted as a shared visit with non-physician practitioner(s) and myself.  I personally evaluated the patient during the encounter.  Meds given in ED:    Current Discharge Medication List      Clinical Impression: Unstable angina  Final diagnoses:  Unstable angina     Johnna Acosta, MD 12/28/13 1025

## 2013-12-27 NOTE — H&P (Signed)
History and Physical  Patient ID: [name] MRN: [id number], SOB: [birth year] 68 y.o. Date of Encounter: 12/27/2013, 8:42 PM  Primary Physician: Fae Pippin Primary Cardiologist: Dr. Tamala Julian  Chief Complaint: chest pain  HPI: 68 y.o. male w/ PMHx significant for CAD s/p LAD stent 08/2011, DM2, depression/anxiety, IBS who presented to [facility] on 12/27/2013 with complaints of chest discomfort. Started at appox 330 AM in the morning and continued throughout the day. Left sided, up into arm and shoulder. Around 5 pm, took 2 nitro and 4 aspirins with some relief of the pain. Continued pain so was transported by EMS to [facility]. Pain is resolved now. In the ED, was started on heparin gtt.   Also reports that his IBS is acting up. Left sided abdominal pain. Sharp. Denies any fevers, chills, vomiting, diarrhea or consitpation.   EKG revealed NSR with no acute ST changes. CXR was without acute cardiopulmonary abnormalities (old granulomatous dz). Labs are significant for nl troponin.    Past Medical History  Diagnosis Date  . Coronary artery disease     cardiac cath 08/21/11:  pLAD 50%, mLAD 30-60%, mLAD 95%, OM1 99%, dPDA 95%, mRCA 70%, EF 70%.  The mLAD was tx with a Promus DES.  Attempts were made at PCI of the OM1 but this was unsuccessful mainly due to exacerbation of back pain  . Hypertension   . Type II diabetes mellitus   . Headache(784.0)     "every now & then"  . DJD (degenerative joint disease)   . Arthritis   . Gout   . Kidney stones   . Tuberculosis   . Anxiety   . Depression   . GERD (gastroesophageal reflux disease)      Surgical History:  Past Surgical History  Procedure Laterality Date  . Cholecystectomy  1970's  . Shoulder arthroscopy w/ rotator cuff repair  ~ 2011    left  . Anterior cervical decomp/discectomy fusion  04/2007    C4-5; C6-7  . Back surgery      "3 times; last time was screws in lower back 10/2008"  . Coronary angioplasty  1999  .  Lithotripsy    . Pci  5/09/2-13    LAD     Home Meds: Prior to Admission medications   Medication Sig Start Date End Date Taking? Authorizing Provider  amLODipine (NORVASC) 10 MG tablet Take 10 mg by mouth daily.   Yes Historical Provider, MD  aspirin 81 MG tablet Take 81 mg by mouth daily.   Yes Historical Provider, MD  atorvastatin (LIPITOR) 20 MG tablet Take 20 mg by mouth daily.   Yes Historical Provider, MD  carvedilol (COREG) 25 MG tablet Take 25 mg by mouth 2 (two) times daily with a meal.   Yes Historical Provider, MD  clopidogrel (PLAVIX) 75 MG tablet Take 1 tablet (75 mg total) by mouth daily with breakfast. 03/14/13 10/05/14 Yes Belva Crome III, MD  FLUoxetine (PROZAC) 40 MG capsule Take 40 mg by mouth daily.   Yes Historical Provider, MD  hydrochlorothiazide (HYDRODIURIL) 12.5 MG tablet Take 12.5 mg by mouth daily.   Yes Historical Provider, MD  HYDROmorphone (DILAUDID) 4 MG tablet Take by mouth every 4 (four) hours as needed for severe pain.   Yes Historical Provider, MD  levothyroxine (SYNTHROID, LEVOTHROID) 25 MCG tablet Take 25 mcg by mouth daily.   Yes Historical Provider, MD  losartan (COZAAR) 100 MG tablet Take 100 mg by mouth daily.   Yes Historical  Provider, MD  metFORMIN (GLUCOPHAGE-XR) 500 MG 24 hr tablet Take 2,000 mg by mouth 2 (two) times daily.   Yes Historical Provider, MD  nitroGLYCERIN (NITROSTAT) 0.4 MG SL tablet Place 0.4 mg under the tongue every 5 (five) minutes as needed. For chest pain   Yes Historical Provider, MD  carvedilol (COREG) 25 MG tablet Take 1 tablet (25 mg total) by mouth 2 (two) times daily with a meal. 09/02/11 03/14/13  Belva Crome III, MD  hydrochlorothiazide (MICROZIDE) 12.5 MG capsule Take 1 capsule (12.5 mg total) by mouth daily. 09/02/11 03/14/13  Sinclair Grooms, MD    Allergies:  Allergies  Allergen Reactions  . Sulfa Antibiotics Rash    History   Social History  . Marital Status: Single    Spouse Name: N/A    Number of  Children: N/A  . Years of Education: N/A   Occupational History  . Not on file.   Social History Main Topics  . Smoking status: Former Smoker    Types: Cigarettes    Quit date: 04/14/1966  . Smokeless tobacco: Current User    Types: Chew     Comment: "smoked a pack q 2-3 weeks"  . Alcohol Use: No  . Drug Use: No  . Sexual Activity: No   Other Topics Concern  . Not on file   Social History Narrative  . No narrative on file     Family History  Problem Relation Age of Onset  . Cancer Father     Review of Systems: General: negative for chills, fever, night sweats or weight changes.  Cardiovascular: see HPI Dermatological: negative for rash Respiratory: negative for cough or wheezing Urologic: negative for hematuria Abdominal: negative for nausea, vomiting, diarrhea, bright red blood per rectum, melena, or hematemesis Neurologic: negative for visual changes, syncope, or dizziness All other systems reviewed and are otherwise negative except as noted above.  Labs:   Lab Results  Component Value Date   WBC 6.9 12/27/2013   HGB 12.9* 12/27/2013   HCT 36.7* 12/27/2013   MCV 87.6 12/27/2013   PLT 174 12/27/2013    Recent Labs Lab 12/27/13 1820  NA 141  K 4.4  CL 103  CO2 25  BUN 20  CREATININE 0.77  CALCIUM 9.4  PROT 6.8  BILITOT 0.5  ALKPHOS 41  ALT 19  AST 13  GLUCOSE 119*    Recent Labs  12/27/13 1820  TROPONINI <0.30   Lab Results  Component Value Date   CHOL 175 07/31/2011   HDL 38* 07/31/2011   LDLCALC 105* 07/31/2011   TRIG 161* 07/31/2011    Radiology/Studies:  Dg Chest Port 1 View  12/27/2013   CLINICAL DATA:  Nausea.  Dizziness.  Abdominal pain.  Chest pain.  EXAM: PORTABLE CHEST - 1 VIEW  COMPARISON:  Multiple exams, including 01/28/2013  FINDINGS: Upper normal heart size. Calcified granuloma, left mid lung, stable from 2013. The lungs appear otherwise clear. Left costophrenic angle excluded. Lower cervical plate and screw fixator noted.  Thoracic spondylosis is present.  IMPRESSION: 1. No acute findings. 2. Thoracic spondylosis. 3. Old granulomatous disease.   Electronically Signed   By: Sherryl Barters M.D.   On: 12/27/2013 19:42     EKG: sinus, normal  Physical Exam Blood pressure 126/79, pulse 60, temperature 97.9 F (36.6 C), temperature source Oral, resp. rate 20, height 5\' 9"  (1.753 m), weight 111.585 kg (246 lb), SpO2 99.00%. General: obese, NAD Head: Normocephalic, atraumatic, sclera non-icteric, nares are  without discharge Neck: Supple. Negative for carotid bruits. JVD not elevated. Lungs: Clear bilaterally to auscultation without wheezes, rales, or rhonchi. Breathing is unlabored. Heart: RRR with S1 S2. No murmurs, rubs, or gallops appreciated. Abdomen: NABS, large panniculus, mildly tender in LU quadrant. Msk:  Strength and tone appear normal for age. Extremities: No edema. No clubbing or cyanosis. Distal pedal pulses are 2+ and equal bilaterally. Neuro: Alert and oriented X 3. Moves all extremities spontaneously. Psych:  Responds to questions appropriately with a normal affect.   Problem List 1. Chest pain, c/w unstable angina 2. Abdominal pain, c/w prior IBS symptoms 3. DM2 4. HTN 5. Hyperlipidemia 6. Obesity   ASSESSMENT AND PLAN:  68 y.o. male w/ PMHx significant for CAD s/p LAD stent 08/2011, DM2, depression/anxiety, IBS who presented to [facility] on 12/27/2013 with complaints of chest discomfort c/w unstable angina.  Encouragingly, chest pain free now, no EKG changes and troponin wnl. Continue cardiac regimen of aspirin, plavix, BB, statin, and antihypertensives. The ER started heparin and that is reasonable to continue at this point.  In the AM, decision for invasive cath (defnitely indicated if elevated troponins or recurrent symptoms) vs. Noninvasive risk stratification which is what the patient prefers (bad experience with radial case apparently).  Hold metformin, cover with sliding  scale.  Reports abdominal pain is consistent with his IBS for which he uses dilaudid prn (does not use bentyl). No fevers, elevated WBC to suggest diverticulitis. Monitor for now.  NPO at midnight for potential cath vs. MIBI.  Patient desires limited interventions in the setting of a code and does not want to be intubated under any circumstances.   Signed, Elias Else, Mila Homer MD 12/27/2013, 8:42 PM

## 2013-12-27 NOTE — Progress Notes (Signed)
ANTICOAGULATION CONSULT NOTE - Initial Consult  Pharmacy Consult for heparin Indication: chest pain/ACS  Allergies  Allergen Reactions  . Sulfa Antibiotics Rash    Patient Measurements: Height: 5\' 9"  (175.3 cm) Weight: 246 lb (111.585 kg) IBW/kg (Calculated) : 70.7 Heparin Dosing Weight: 96 kg  Vital Signs: Temp: 97.9 F (36.6 C) (09/15 1800) Temp src: Oral (09/15 1800) BP: 122/65 mmHg (09/15 1902) Pulse Rate: 57 (09/15 1902)  Labs:  Recent Labs  12/27/13 1820  HGB 12.9*  HCT 36.7*  PLT 174  CREATININE 0.77  TROPONINI <0.30    Estimated Creatinine Clearance: 113.4 ml/min (by C-G formula based on Cr of 0.77).   Medical History: Past Medical History  Diagnosis Date  . Coronary artery disease     cardiac cath 08/21/11:  pLAD 50%, mLAD 30-60%, mLAD 95%, OM1 99%, dPDA 95%, mRCA 70%, EF 70%.  The mLAD was tx with a Promus DES.  Attempts were made at PCI of the OM1 but this was unsuccessful mainly due to exacerbation of back pain  . Hypertension   . Type II diabetes mellitus   . Headache(784.0)     "every now & then"  . DJD (degenerative joint disease)   . Arthritis   . Gout   . Kidney stones   . Tuberculosis   . Anxiety   . Depression   . GERD (gastroesophageal reflux disease)     Medications:  See home med list   Assessment: 68 year old man with known CAD who presents to the ED with chest pain.  Heparin to start for ACS Goal of Therapy:  Heparin level 0.3-0.7 units/ml Monitor platelets by anticoagulation protocol: Yes   Plan:  Give 4000 units bolus x 1 Start heparin infusion at 1350 units/hr Check anti-Xa level in 6 hours and daily while on heparin Continue to monitor H&H and platelets  Candie Mile 12/27/2013,7:39 PM

## 2013-12-27 NOTE — ED Notes (Signed)
Pt via EMS from home with CP started this AM at 0330. Pt states no change in pain throughout the day until this evening, when it got worse. Pt took 2 of his home nitro and 324 ASA with some relief. ZPt was given another nitro and ODT zofran with EMS. Pt has history of stent placement in 2013. Pt currently c/o nausea, dizziness, abdominal pain,  and lightheadedness in addition to CP. VSS at this time. NAD noted.

## 2013-12-27 NOTE — ED Provider Notes (Signed)
CSN: [id number]     Arrival date & time 12/27/13  1748 History   First MD Initiated Contact with Patient 12/27/13 1751     Chief Complaint  Patient presents with  . Chest Pain  . Abdominal Pain     (Consider location/radiation/quality/duration/timing/severity/associated sxs/prior Treatment) HPI [name] is a 68 y.o. male with a history of hypercholesterolemia, hypertension, diabetes, cardiac cath 2013 with stent placed because in for evaluation of chest pain. Patient states this morning at 4:00am he woke up with a dull squeezing pain in his left chest that radiated to his left shoulder and arm and through to his back. He states his pain has been present all day long and nothing he did make it better or worse. He said the pain increased and got much worse at 5:00 this evening and he called EMS. He was given aspirin and nitroglycerin en route and said that made his pain feel much better, brought his pain down from an 8/10 to a 3/10. He also reports nausea and dizziness associated with his chest pain this morning and diaphoresis with this chest pain this evening. No fevers, numbness or weakness, loss of bowel bladder.  Past Medical History  Diagnosis Date  . Coronary artery disease     cardiac cath 08/21/11:  pLAD 50%, mLAD 30-60%, mLAD 95%, OM1 99%, dPDA 95%, mRCA 70%, EF 70%.  The mLAD was tx with a Promus DES.  Attempts were made at PCI of the OM1 but this was unsuccessful mainly due to exacerbation of back pain  . Hypertension   . Type II diabetes mellitus   . Headache(784.0)     "every now & then"  . DJD (degenerative joint disease)   . Arthritis   . Gout   . Kidney stones   . Tuberculosis   . Anxiety   . Depression   . GERD (gastroesophageal reflux disease)    Past Surgical History  Procedure Laterality Date  . Cholecystectomy  1970's  . Shoulder arthroscopy w/ rotator cuff repair  ~ 2011    left  . Anterior cervical decomp/discectomy fusion  04/2007    C4-5; C6-7  . Back  surgery      "3 times; last time was screws in lower back 10/2008"  . Coronary angioplasty  1999  . Lithotripsy    . Pci  5/09/2-13    LAD   Family History  Problem Relation Age of Onset  . Cancer Father    History  Substance Use Topics  . Smoking status: Former Smoker    Types: Cigarettes    Quit date: 04/14/1966  . Smokeless tobacco: Current User    Types: Chew     Comment: "smoked a pack q 2-3 weeks"  . Alcohol Use: No    Review of Systems  Constitutional: Negative for fever.  HENT: Negative for sore throat.   Eyes: Negative for visual disturbance.  Respiratory: Positive for chest tightness and shortness of breath.   Cardiovascular: Positive for chest pain.  Gastrointestinal: Positive for nausea. Negative for abdominal pain.  Endocrine: Negative for polyuria.  Genitourinary: Negative for dysuria.  Musculoskeletal: Negative for neck pain.  Skin: Negative for rash.  Neurological: Negative for headaches.      Allergies  Sulfa antibiotics  Home Medications   Prior to Admission medications   Medication Sig Start Date End Date Taking? Authorizing Provider  amLODipine (NORVASC) 10 MG tablet Take 10 mg by mouth daily.   Yes Historical Provider, MD  aspirin  81 MG tablet Take 81 mg by mouth daily.   Yes Historical Provider, MD  atorvastatin (LIPITOR) 20 MG tablet Take 20 mg by mouth daily.   Yes Historical Provider, MD  carvedilol (COREG) 25 MG tablet Take 25 mg by mouth 2 (two) times daily with a meal.   Yes Historical Provider, MD  clopidogrel (PLAVIX) 75 MG tablet Take 1 tablet (75 mg total) by mouth daily with breakfast. 03/14/13 10/05/14 Yes Belva Crome III, MD  FLUoxetine (PROZAC) 40 MG capsule Take 40 mg by mouth daily.   Yes Historical Provider, MD  hydrochlorothiazide (HYDRODIURIL) 12.5 MG tablet Take 12.5 mg by mouth daily.   Yes Historical Provider, MD  HYDROmorphone (DILAUDID) 4 MG tablet Take by mouth every 4 (four) hours as needed for severe pain.   Yes  Historical Provider, MD  levothyroxine (SYNTHROID, LEVOTHROID) 25 MCG tablet Take 25 mcg by mouth daily.   Yes Historical Provider, MD  losartan (COZAAR) 100 MG tablet Take 100 mg by mouth daily.   Yes Historical Provider, MD  metFORMIN (GLUCOPHAGE-XR) 500 MG 24 hr tablet Take 2,000 mg by mouth 2 (two) times daily.   Yes Historical Provider, MD  nitroGLYCERIN (NITROSTAT) 0.4 MG SL tablet Place 0.4 mg under the tongue every 5 (five) minutes as needed. For chest pain   Yes Historical Provider, MD  carvedilol (COREG) 25 MG tablet Take 1 tablet (25 mg total) by mouth 2 (two) times daily with a meal. 09/02/11 03/14/13  Belva Crome III, MD  hydrochlorothiazide (MICROZIDE) 12.5 MG capsule Take 1 capsule (12.5 mg total) by mouth daily. 09/02/11 03/14/13  Belva Crome III, MD   BP 122/65  Pulse 57  Temp(Src) 97.9 F (36.6 C) (Oral)  Resp 14  Ht 5\' 9"  (1.753 m)  Wt 246 lb (111.585 kg)  BMI 36.31 kg/m2  SpO2 98% Physical Exam  Nursing note and vitals reviewed. Constitutional: He is oriented to person, place, and time. He appears well-developed and well-nourished.  HENT:  Head: Normocephalic and atraumatic.  Mouth/Throat: Oropharynx is clear and moist.  Eyes: Conjunctivae are normal. Pupils are equal, round, and reactive to light. Right eye exhibits no discharge. Left eye exhibits no discharge. No scleral icterus.  Neck: Neck supple. No JVD present.  Cardiovascular: Normal rate, regular rhythm, normal heart sounds and intact distal pulses.   Pulmonary/Chest: Effort normal and breath sounds normal. No respiratory distress. He has no wheezes. He has no rales.  Abdominal: Soft. There is no tenderness.  Musculoskeletal: He exhibits no edema and no tenderness.  Neurological: He is alert and oriented to person, place, and time.  Cranial Nerves II-XII grossly intact  Skin: Skin is warm and dry. No rash noted.  Psychiatric: He has a normal mood and affect.    ED Course  Procedures (including critical  care time) Labs Review Labs Reviewed  CBC WITH DIFFERENTIAL - Abnormal; Notable for the following:    RBC 4.19 (*)    Hemoglobin 12.9 (*)    HCT 36.7 (*)    All other components within normal limits  COMPREHENSIVE METABOLIC PANEL - Abnormal; Notable for the following:    Glucose, Bld 119 (*)    All other components within normal limits  TROPONIN I  I-STAT TROPOININ, ED    Imaging Review No results found.   EKG Interpretation   Date/Time:  Tuesday December 27 2013 17:53:07 EDT Ventricular Rate:  54 PR Interval:  142 QRS Duration: 93 QT Interval:  442 QTC Calculation: 419  R Axis:   20 Text Interpretation:  Sinus bradycardia ECG OTHERWISE WITHIN NORMAL LIMITS  Since last tracing rate slower Confirmed by MILLER  MD, BRIAN ([id number]) on  12/27/2013 5:59:41 PM     Meds given in ED:  Medications  heparin ADULT infusion 100 units/mL (25000 units/250 mL) (not administered)  heparin bolus via infusion 4,000 Units (not administered)    New Prescriptions   No medications on file   Filed Vitals:   12/27/13 1754 12/27/13 1800 12/27/13 1804 12/27/13 1902  BP:  141/67 132/65 122/65  Pulse:  54 55 57  Temp:  97.9 F (36.6 C)    TempSrc:  Oral    Resp:  22 14 14   Height:  5\' 9"  (1.753 m)    Weight:  246 lb (111.585 kg)    SpO2: 100% 100% 100% 98%    MDM  Vitals stable - WNL -afebrile Pt resting comfortably in ED. first check he denies anything for pain. No shortness of breath now PE shows no sign of fluid overload, no JVD, no leg swelling. Labwork noncontributory, no significant changes on EKG, His x-ray shows no acute cardiopulmonary abnormality.  Heart Score - 5. Patient is moderate risk, cannot rule out unstable angina. Heparin administered in ED. Discussed admission to hospital for observation, pt very amenable to plan. 7:05pm Dr. Sabra Heck spoke with Cardiology, they agreed to come see and admit pt.   Final diagnoses:  Unstable angina  Prior to patient discharge, I  discussed and reviewed this case with Dr.Brian Acquanetta Sit, PA-C 12/27/13 1943

## 2013-12-28 ENCOUNTER — Observation Stay (HOSPITAL_COMMUNITY): Payer: Medicare Other

## 2013-12-28 ENCOUNTER — Encounter (HOSPITAL_COMMUNITY): Payer: Self-pay | Admitting: *Deleted

## 2013-12-28 DIAGNOSIS — I2510 Atherosclerotic heart disease of native coronary artery without angina pectoris: Secondary | ICD-10-CM

## 2013-12-28 DIAGNOSIS — R0789 Other chest pain: Secondary | ICD-10-CM

## 2013-12-28 DIAGNOSIS — E669 Obesity, unspecified: Secondary | ICD-10-CM | POA: Diagnosis not present

## 2013-12-28 DIAGNOSIS — I10 Essential (primary) hypertension: Secondary | ICD-10-CM

## 2013-12-28 DIAGNOSIS — I200 Unstable angina: Secondary | ICD-10-CM | POA: Diagnosis not present

## 2013-12-28 DIAGNOSIS — R079 Chest pain, unspecified: Secondary | ICD-10-CM | POA: Diagnosis not present

## 2013-12-28 DIAGNOSIS — I209 Angina pectoris, unspecified: Secondary | ICD-10-CM

## 2013-12-28 HISTORY — DX: Other chest pain: R07.89

## 2013-12-28 LAB — CBC
HCT: 37.2 % — ABNORMAL LOW (ref 39.0–52.0)
Hemoglobin: 13.1 g/dL (ref 13.0–17.0)
MCH: 30.6 pg (ref 26.0–34.0)
MCHC: 35.2 g/dL (ref 30.0–36.0)
MCV: 86.9 fL (ref 78.0–100.0)
PLATELETS: 160 10*3/uL (ref 150–400)
RBC: 4.28 MIL/uL (ref 4.22–5.81)
RDW: 13.5 % (ref 11.5–15.5)
WBC: 6.3 10*3/uL (ref 4.0–10.5)

## 2013-12-28 LAB — GLUCOSE, CAPILLARY
GLUCOSE-CAPILLARY: 159 mg/dL — AB (ref 70–99)
Glucose-Capillary: 109 mg/dL — ABNORMAL HIGH (ref 70–99)

## 2013-12-28 LAB — TROPONIN I
Troponin I: <0.3 ng/mL (ref <0.30)
Troponin I: <0.3 ng/mL (ref <0.30)

## 2013-12-28 LAB — HEPARIN LEVEL (UNFRACTIONATED)
Heparin Unfractionated: 0.52 IU/mL (ref 0.30–0.70)
Heparin Unfractionated: 0.3 IU/mL (ref 0.30–0.70)

## 2013-12-28 LAB — BASIC METABOLIC PANEL
Anion gap: 13 (ref 5–15)
BUN: 15 mg/dL (ref 6–23)
CO2: 25 mEq/L (ref 19–32)
CREATININE: 0.73 mg/dL (ref 0.50–1.35)
Calcium: 9.3 mg/dL (ref 8.4–10.5)
Chloride: 102 mEq/L (ref 96–112)
GFR calc non Af Amer: >90 mL/min (ref >90)
Glucose, Bld: 101 mg/dL — ABNORMAL HIGH (ref 70–99)
Potassium: 4.5 mEq/L (ref 3.7–5.3)
Sodium: 140 mEq/L (ref 137–147)

## 2013-12-28 MED ORDER — HEPARIN (PORCINE) IN NACL 100-0.45 UNIT/ML-% IJ SOLN: 1400.0000 [IU]/h | INTRAMUSCULAR | Status: DC

## 2013-12-28 MED ORDER — REGADENOSON 0.4 MG/5ML IV SOLN
INTRAVENOUS | Status: AC
  Administered 2013-12-28: 0.4 mg via INTRAVENOUS
  Filled 2013-12-28: qty 5

## 2013-12-28 MED ORDER — INFLUENZA VAC SPLIT QUAD 0.5 ML IM SUSY: 0.5000 mL | PREFILLED_SYRINGE | INTRAMUSCULAR | Status: DC

## 2013-12-28 MED ORDER — TECHNETIUM TC 99M SESTAMIBI GENERIC - CARDIOLITE
10.0000 | Freq: Once | INTRAVENOUS | Status: AC | PRN
  Administered 2013-12-28: 10 via INTRAVENOUS

## 2013-12-28 MED ORDER — TECHNETIUM TC 99M SESTAMIBI GENERIC - CARDIOLITE
30.0000 | Freq: Once | INTRAVENOUS | Status: AC | PRN
  Administered 2013-12-28: 30 via INTRAVENOUS

## 2013-12-28 MED ORDER — REGADENOSON 0.4 MG/5ML IV SOLN
0.4000 mg | Freq: Once | INTRAVENOUS | Status: AC
  Administered 2013-12-28: 0.4 mg via INTRAVENOUS
  Filled 2013-12-28: qty 5

## 2013-12-28 NOTE — Progress Notes (Signed)
INITIAL NUTRITION ASSESSMENT  DOCUMENTATION CODES Per approved criteria  -Obesity Unspecified   INTERVENTION: Advance diet as medically appropriate, add interventions accordingly RD to follow for nutrition care plan  NUTRITION DIAGNOSIS: Inadequate oral intake related to inability to eat as evidenced by NPO status  Goal: Pt to meet >/= 90% of their estimated nutrition needs   Monitor:  PO diet advancement & intake, weight, labs, I/O's  Reason for Assessment: Malnutrition Screening Tool Report  68 y.o. male  Admitting Dx:   ASSESSMENT: 68 y.o. Male w/ PMHx significant for CAD s/p LAD stent 08/2011, DM2, depression/anxiety, IBS who presented to [facility] on 12/27/2013 with complaints of chest discomfort.   RD unable to obtain nutrition hx from patient -- currently in [place].  Per Malnutrition Screening Tool Report, pt reported eating poorly because of a decreased appetite and recent weight loss without trying.  Per wt readings, below pt has had a 3% weight loss since December 2014 -- not significant for time frame.  Pt currently NPO.  No % PO intake available per flowsheet records at this time.  RD to monitor, supplement diet as needed.  Height: Ht Readings from Last 1 Encounters:  12/27/13 5\' 9"  (1.753 m)    Weight: Wt Readings from Last 1 Encounters:  12/28/13 243 lb 6.2 oz (110.4 kg)    Ideal Body Weight: 160 lb  % Ideal Body Weight: 152%  Wt Readings from Last 10 Encounters:  12/28/13 243 lb 6.2 oz (110.4 kg)  03/14/13 251 lb (113.853 kg)  09/02/11 272 lb 4.3 oz (123.5 kg)  08/22/11 270 lb 1 oz (122.5 kg)  08/22/11 270 lb 1 oz (122.5 kg)  07/31/11 268 lb 4.8 oz (121.7 kg)    Usual Body Weight: 251 lb  % Usual Body Weight: 97%  BMI:  Body mass index is 35.93 kg/(m^2).  Estimated Nutritional Needs: Kcal: 2000-2200 Protein: 100-110 gm Fluid: 2.0-2.2 L  Skin: Intact  Diet Order: NPO  EDUCATION NEEDS: -No education needs identified  at this time   Intake/Output Summary (Last 24 hours) at 12/28/13 1202 Last data filed at 12/28/13 0600  Gross per 24 hour  Intake    180 ml  Output    600 ml  Net   -420 ml    Labs:   Recent Labs Lab 12/27/13 1820 12/28/13 0527  NA 141 140  K 4.4 4.5  CL 103 102  CO2 25 25  BUN 20 15  CREATININE 0.77 0.73  CALCIUM 9.4 9.3  GLUCOSE 119* 101*    CBG (last 3)   Recent Labs  12/28/13 0615  GLUCAP 109*    Scheduled Meds: . amLODipine  10 mg Oral Daily  . aspirin EC  81 mg Oral Daily  . atorvastatin  20 mg Oral Daily  . carvedilol  25 mg Oral BID WC  . clopidogrel  75 mg Oral Q breakfast  . FLUoxetine  40 mg Oral Daily  . hydrochlorothiazide  12.5 mg Oral Daily  . [START ON 12/29/2013] Influenza vac split quadrivalent PF  0.5 mL Intramuscular Tomorrow-1000  . insulin aspart  0-15 Units Subcutaneous TID WC  . levothyroxine  25 mcg Oral QAC breakfast  . losartan  100 mg Oral Daily    Continuous Infusions: . heparin 1,350 Units/hr (12/28/13 1000)    Past Medical History  Diagnosis Date  . Coronary artery disease     cardiac cath 08/21/11:  pLAD 50%, mLAD 30-60%, mLAD 95%, OM1 99%, dPDA 95%, mRCA  70%, EF 70%.  The mLAD was tx with a Promus DES.  Attempts were made at PCI of the OM1 but this was unsuccessful mainly due to exacerbation of back pain  . Hypertension   . Type II diabetes mellitus   . Headache(784.0)     "every now & then"  . DJD (degenerative joint disease)   . Arthritis   . Gout   . Kidney stones   . Tuberculosis   . Anxiety   . Depression   . GERD (gastroesophageal reflux disease)     Past Surgical History  Procedure Laterality Date  . Cholecystectomy  1970's  . Shoulder arthroscopy w/ rotator cuff repair  ~ 2011    left  . Anterior cervical decomp/discectomy fusion  04/2007    C4-5; C6-7  . Back surgery      "3 times; last time was screws in lower back 10/2008"  . Coronary angioplasty  1999  . Lithotripsy    . Pci  5/09/2-13    LAD     Arthur Holms, RD, LDN Pager #: [phone] After-Hours Pager #: [phone]

## 2013-12-28 NOTE — Progress Notes (Signed)
Utilization Review Completed.[place]ar [place] T9/16/2015  

## 2013-12-28 NOTE — Progress Notes (Signed)
ANTICOAGULATION CONSULT NOTE - Follow Up Consult  Pharmacy Consult for heparin Indication: USAP  Labs:  Recent Labs  12/27/13 1820 12/27/13 2330 12/28/13 0149  HGB 12.9*  --   --   HCT 36.7*  --   --   PLT 174  --   --   HEPARINUNFRC  --   --  0.52  CREATININE 0.77  --   --   TROPONINI <0.30 <0.30  --     Assessment/Plan: 68yo male therapeutic on heparin with initial dosing for CP. Will continue gtt at current rate and confirm stable with additional level.   Wynona Neat, PharmD, BCPS  12/28/2013,2:30 AM

## 2013-12-28 NOTE — Progress Notes (Signed)
Patient's history has tuberculosis as a past diagnosis, however patient says he has never had nor was ever diagnosed or treated for tuberculosis. Patient further requests his chart be correct to reflect no prior history of tuberculosis. Patient also denies persistent cough, blood tinged sputum and night sweats or fever.

## 2013-12-28 NOTE — Progress Notes (Signed)
Patient Profile: 68 y.o. male w/ PMHx significant for CAD s/p LAD stent 08/2011, DM2, depression/anxiety, IBS who presented to [facility] on 12/27/2013 with complaints of chest discomfort c/w unstable angina.  Cardiac enzymes negative x 3.   Subjective: No recurrent chest, neck/back pain. Still with mild intermittent abdominal discomfort, consistent with his usual IBS symptoms.   Objective: Vital signs in last 24 hours: Temp:  [97.9 F (36.6 C)-98.1 F (36.7 C)] 98.1 F (36.7 C) (09/16 0341) Pulse Rate:  [54-66] 56 (09/16 0341) Resp:  [14-22] 18 (09/16 0341) BP: (119-165)/(51-79) 147/64 mmHg (09/16 0341) SpO2:  [98 %-100 %] 98 % (09/16 0341) Weight:  [243 lb 6.2 oz (110.4 kg)-246 lb (111.585 kg)] 243 lb 6.2 oz (110.4 kg) (09/16 0341) Last BM Date: 12/26/13  Intake/Output from previous day: 09/15 0701 - 09/16 0700 In: 180 [I.V.:180] Out: 600 [Urine:600] Intake/Output this shift:    Medications Current Facility-Administered Medications  Medication Dose Route Frequency Provider Last Rate Last Dose  . acetaminophen (TYLENOL) tablet 650 mg  650 mg Oral Q4H PRN Cletus Gash, MD      . amLODipine (NORVASC) tablet 10 mg  10 mg Oral Daily Cletus Gash, MD      . aspirin EC tablet 81 mg  81 mg Oral Daily Cletus Gash, MD      . atorvastatin (LIPITOR) tablet 20 mg  20 mg Oral Daily Cletus Gash, MD      . carvedilol (COREG) tablet 25 mg  25 mg Oral BID WC Cletus Gash, MD   25 mg at 12/28/13 0612  . clopidogrel (PLAVIX) tablet 75 mg  75 mg Oral Q breakfast Cletus Gash, MD   75 mg at 12/28/13 0612  . FLUoxetine (PROZAC) capsule 40 mg  40 mg Oral Daily Cletus Gash, MD      . heparin ADULT infusion 100 units/mL (25000 units/250 mL)  1,350 Units/hr Intravenous Continuous Verl Dicker, PA-C 13.5 mL/hr at 12/27/13 1945 1,350 Units/hr at 12/27/13 1945  . hydrochlorothiazide (MICROZIDE) capsule 12.5 mg  12.5 mg Oral Daily Cletus Gash, MD      .  HYDROmorphone (DILAUDID) tablet 1 mg  1 mg Oral Q4H PRN Cletus Gash, MD      . Derrill Memo ON 12/29/2013] Influenza vac split quadrivalent PF (FLUARIX) injection 0.5 mL  0.5 mL Intramuscular Tomorrow-1000 Shandell Jallow, MD      . insulin aspart (novoLOG) injection 0-15 Units  0-15 Units Subcutaneous TID WC Cletus Gash, MD      . levothyroxine (SYNTHROID, LEVOTHROID) tablet 25 mcg  25 mcg Oral QAC breakfast Cletus Gash, MD   25 mcg at 12/28/13 [phone]  . losartan (COZAAR) tablet 100 mg  100 mg Oral Daily Cletus Gash, MD      . nitroGLYCERIN (NITROSTAT) SL tablet 0.4 mg  0.4 mg Sublingual Q5 Min x 3 PRN Cletus Gash, MD      . ondansetron [facility]) injection 4 mg  4 mg Intravenous Q6H PRN Cletus Gash, MD        PE: General appearance: alert, cooperative, no distress and morbidly obese Neck: no JVD Lungs: clear to auscultation bilaterally Heart: regular rate and rhythm Abdomen: obsese, normal BS, non tender Extremities: no LEE Pulses: 2+ and symmetric Skin: warm and dry Neurologic: Grossly normal  Lab Results:   Recent Labs  12/27/13 1820 12/28/13 0527  WBC 6.9 6.3  HGB 12.9* 13.1  HCT 36.7* 37.2*  PLT 174 160   BMET  Recent Labs  12/27/13 1820 12/28/13 0527  NA 141 140  K 4.4 4.5  CL 103 102  CO2 25 25  GLUCOSE 119* 101*  BUN 20 15  CREATININE 0.77 0.73  CALCIUM 9.4 9.3   Cardiac Panel (last 3 results)  Recent Labs  12/27/13 1820 12/27/13 2330 12/28/13 0527  TROPONINI <0.30 <0.30 <0.30    Assessment/Plan  Active Problems:   Unstable angina  1. Chest Pain: with mixed typical and atypical features: dull SSCP radiating to neck and back, occuring at rest, but not worse with exertion and only mildly improved with SL NTG. Also with abdominal discomfort, typical of his IBS, radiating to the chest. Currently CP free. CE negative x 3. EKG nonischemic. Would recommend risk stratification with a NST. If abnormal, then LHC. For now continue current  medical regimen: ASA, Plavix, atorvastatin, carvedilol, losartan, HCTZ, amlodipine.     LOS: 1 day    Brittainy M. Ladoris Gene 12/28/2013 7:54 AM  I have seen and examined the patient along with Brittainy M. Rosita Fire, PA-C.  I have reviewed the chart, notes and new data.  I agree with PA's note.  Key new complaints: chest symptoms are mostly atypical Key examination changes: no arrhythmia/signs of CHF Key new findings / data: negative enzymes and normal ECG  PLAN: DC home if stress test is normal.  Sanda Klein, MD, [facility] [phone] 12/28/2013, 12:42 PM

## 2013-12-28 NOTE — Progress Notes (Signed)
Patient's Lexiscan stress test completed without complication, pending result by [facility] radiology.  Hilbert Corrigan PA Pager: [phone]

## 2013-12-28 NOTE — ED Provider Notes (Signed)
Medical screening examination/treatment/procedure(s) were conducted as a shared visit with non-physician practitioner(s) and myself.  I personally evaluated the patient during the encounter  Please see my separate respective documentation pertaining to this patient encounter   Johnna Acosta, MD 12/28/13 1024

## 2013-12-28 NOTE — Discharge Summary (Signed)
Physician Discharge Summary  Patient ID: NOBLE CICALESE MRN: [id number] DOB/AGE: 66-Jul-1947 66 y.o.  Admit date: 12/27/2013 Discharge date: 12/28/2013  Admission Diagnoses: Atypical Chest Pain  Discharge Diagnoses:  Principal Problem:   Atypical chest pain   Discharged Condition: stable  Hospital Course: The patient is a 68 y.o. male w/ PMHx significant for CAD s/p LAD stent 08/2011, DM2, depression/anxiety, IBS who presented to [facility] on 12/27/2013 with complaints of chest discomfort. Started at appox 330 AM in the morning and continued throughout the day. Left sided, up into arm and shoulder. Around 5 pm, took 2 nitro and 4 aspirins with some relief of the pain. He continued to have pain so was transported by EMS to [facility]. His pain resolved by the time he arrived. EKG was nonischemic. However, given his history, he was admitted for observation. Cardiac enzymes were cycled and were negative x 3. He had no additional chest pain overnight. He underwent a Lexiscan NST that was low risk for ischemia. LVEF was estimated at 67%. It was felt that his symptoms were non cardiac in nature. His HR and BP both remained stable. He was last seen and examined by Dr. Sallyanne Kuster, who determined he was stable for discharge home. He is scheduled for his yearly f/u with Dr. Tamala Julian in November. He was instructed to keep this appointment. No changes were made to his medication regimen.    Consults: None  Significant Diagnostic Studies:    Treatments: See Hospital Course  Discharge Exam: Blood pressure 144/67, pulse 56, temperature 98.1 F (36.7 C), temperature source Oral, resp. rate 18, height 5\' 9"  (1.753 m), weight 243 lb 6.2 oz (110.4 kg), SpO2 98.00%.   Disposition: 01-Home or Self Care      Discharge Instructions   Diet - low sodium heart healthy    Complete by:  As directed      Increase activity slowly    Complete by:  As directed             Medication List         amLODipine  10 MG tablet  Commonly known as:  NORVASC  Take 10 mg by mouth daily.     aspirin 81 MG tablet  Take 81 mg by mouth daily.     atorvastatin 20 MG tablet  Commonly known as:  LIPITOR  Take 20 mg by mouth daily.     carvedilol 25 MG tablet  Commonly known as:  COREG  Take 25 mg by mouth 2 (two) times daily with a meal.     carvedilol 25 MG tablet  Commonly known as:  COREG  Take 1 tablet (25 mg total) by mouth 2 (two) times daily with a meal.     clopidogrel 75 MG tablet  Commonly known as:  PLAVIX  Take 1 tablet (75 mg total) by mouth daily with breakfast.     FLUoxetine 40 MG capsule  Commonly known as:  PROZAC  Take 40 mg by mouth daily.     hydrochlorothiazide 12.5 MG tablet  Commonly known as:  HYDRODIURIL  Take 12.5 mg by mouth daily.     hydrochlorothiazide 12.5 MG capsule  Commonly known as:  MICROZIDE  Take 1 capsule (12.5 mg total) by mouth daily.     HYDROmorphone 4 MG tablet  Commonly known as:  DILAUDID  Take by mouth every 4 (four) hours as needed for severe pain.     levothyroxine 25 MCG tablet  Commonly known as:  SYNTHROID, LEVOTHROID  Take 25 mcg by mouth daily.     losartan 100 MG tablet  Commonly known as:  COZAAR  Take 100 mg by mouth daily.     metFORMIN 500 MG 24 hr tablet  Commonly known as:  GLUCOPHAGE-XR  Take 2,000 mg by mouth 2 (two) times daily.     nitroGLYCERIN 0.4 MG SL tablet  Commonly known as:  NITROSTAT  Place 0.4 mg under the tongue every 5 (five) minutes as needed. For chest pain       Follow-up Information   Follow up with Sinclair Grooms, MD On 03/13/2014. (12:00 pm (for yearly follow-up).)    Specialty:  Cardiology   Contact information:   [address] [address] [phone]     TIME SPENT ON DISCHARGE INCLUDING PHYSICIAN TIME: >30 MINUTES.   SignedLyda Jester 12/28/2013, 5:01 PM

## 2013-12-28 NOTE — Progress Notes (Signed)
Discharge instructions, medications and follow up appointments reviewed with patient. Patient voices understanding to teaching.  To door via wheelchair.  Home via [place] with his daughter driving

## 2013-12-28 NOTE — Progress Notes (Signed)
ANTICOAGULATION CONSULT NOTE - Follow Up Consult  Pharmacy Consult:  Heparin Indication: chest pain/ACS  Allergies  Allergen Reactions  . Sulfa Antibiotics Rash    Patient Measurements: Height: 5\' 9"  (175.3 cm) Weight: 243 lb 6.2 oz (110.4 kg) IBW/kg (Calculated) : 70.7 Heparin Dosing Weight: 96 kg  Vital Signs: Temp: 98.1 F (36.7 C) (09/16 0341) Temp src: Oral (09/16 0341) BP: 147/64 mmHg (09/16 0341) Pulse Rate: 56 (09/16 0341)  Labs:  Recent Labs  12/27/13 1820 12/27/13 2330 12/28/13 0149 12/28/13 0527  HGB 12.9*  --   --  13.1  HCT 36.7*  --   --  37.2*  PLT 174  --   --  160  HEPARINUNFRC  --   --  0.52  --   CREATININE 0.77  --   --  0.73  TROPONINI <0.30 <0.30  --  <0.30    Estimated Creatinine Clearance: 112.8 ml/min (by C-G formula based on Cr of 0.73).     Assessment: 29 YOF admitted with chest pain and started on IV heparin for ACS.  S/p stress test today.  Heparin level therapeutic.  No bleeding reported.   Goal of Therapy:  Heparin level 0.3-0.7 units/ml Monitor platelets by anticoagulation protocol: Yes    Plan:  - Increase heparin gtt slightly to 1400 units/hr - Daily HL / CBC - F/U stress test result and AC plan    Niaja Stickley D. Mina Marble, PharmD, BCPS Pager:  [phone] 12/28/2013, 3:18 PM

## 2014-02-07 DIAGNOSIS — E119 Type 2 diabetes mellitus without complications: Secondary | ICD-10-CM | POA: Diagnosis not present

## 2014-02-07 DIAGNOSIS — I2510 Atherosclerotic heart disease of native coronary artery without angina pectoris: Secondary | ICD-10-CM | POA: Diagnosis not present

## 2014-02-07 DIAGNOSIS — I10 Essential (primary) hypertension: Secondary | ICD-10-CM | POA: Diagnosis not present

## 2014-02-07 DIAGNOSIS — R1032 Left lower quadrant pain: Secondary | ICD-10-CM | POA: Diagnosis not present

## 2014-02-07 DIAGNOSIS — L281 Prurigo nodularis: Secondary | ICD-10-CM | POA: Diagnosis not present

## 2014-02-15 DIAGNOSIS — N200 Calculus of kidney: Secondary | ICD-10-CM | POA: Diagnosis not present

## 2014-02-15 DIAGNOSIS — N281 Cyst of kidney, acquired: Secondary | ICD-10-CM | POA: Diagnosis not present

## 2014-02-17 DIAGNOSIS — Z23 Encounter for immunization: Secondary | ICD-10-CM | POA: Diagnosis not present

## 2014-03-13 ENCOUNTER — Encounter: Payer: Self-pay | Admitting: Interventional Cardiology

## 2014-03-13 ENCOUNTER — Ambulatory Visit (INDEPENDENT_AMBULATORY_CARE_PROVIDER_SITE_OTHER): Payer: Medicare Other | Admitting: Interventional Cardiology

## 2014-03-13 VITALS — BP 144/76 | HR 58 | Ht 69.0 in | Wt 253.0 lb

## 2014-03-13 DIAGNOSIS — I10 Essential (primary) hypertension: Secondary | ICD-10-CM | POA: Diagnosis not present

## 2014-03-13 DIAGNOSIS — E118 Type 2 diabetes mellitus with unspecified complications: Secondary | ICD-10-CM

## 2014-03-13 DIAGNOSIS — E785 Hyperlipidemia, unspecified: Secondary | ICD-10-CM

## 2014-03-13 DIAGNOSIS — I200 Unstable angina: Secondary | ICD-10-CM | POA: Diagnosis not present

## 2014-03-13 DIAGNOSIS — I25119 Atherosclerotic heart disease of native coronary artery with unspecified angina pectoris: Secondary | ICD-10-CM | POA: Diagnosis not present

## 2014-03-13 NOTE — Progress Notes (Signed)
Patient ID: [name], male   DOB: February 12, 1946, 68 y.o.   MRN: [id number]    [address], [address] [place], [place]  [address] Phone: [phone] Fax:  [phone]  Date:  03/13/2014   ID:  [name], DOB 1945-09-05, MRN [id number]  PCP:  Fae Pippin   ASSESSMENT:  1. Coronary atherosclerosis with stent in the mid LAD and recent nonischemic myocardial perfusion study 2. Morbid obesity 3. Essential hypertension, controlled 4. Diabetes with complications, vascular  PLAN:  1. Aerobic activity 2. Continue current medical regimen 3. Weight loss 4. Low-fat diet   SUBJECTIVE: [name] is a 68 y.o. male who had a recent hospital stay because of fleeting left chest discomfort. The episodes of discomfort were sharp. There was no radiation. He was hospitalized overnight with Beverely Low I called and negative. A myocardial perfusion study was low risk. Over the past 6-8 weeks he has had no recurrence of symptoms. He denies claudication. 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   Wt Readings from Last 3 Encounters:  03/13/14 253 lb (114.76 kg)  12/28/13 243 lb 6.2 oz (110.4 kg)  03/14/13 251 lb (113.853 kg)     Past Medical History  Diagnosis Date  . Coronary artery disease     cardiac cath 08/21/11:  pLAD 50%, mLAD 30-60%, mLAD 95%, OM1 99%, dPDA 95%, mRCA 70%, EF 70%.  The mLAD was tx with a Promus DES.  Attempts were made at PCI of the OM1 but this was unsuccessful mainly due to exacerbation of back pain  . Hypertension   . Type II diabetes mellitus   . Headache(784.0)     "every now & then"  . DJD (degenerative joint disease)   . Arthritis   . Gout   . Kidney stones   . Tuberculosis   . Anxiety   . Depression   . GERD (gastroesophageal reflux disease)     Current Outpatient Prescriptions  Medication Sig Dispense Refill  . amLODipine (NORVASC) 10 MG tablet Take 10 mg by mouth daily.    Marland Kitchen aspirin 81 MG tablet Take 81 mg by mouth  daily.    Marland Kitchen atorvastatin (LIPITOR) 20 MG tablet Take 20 mg by mouth daily.    . carvedilol (COREG) 25 MG tablet Take 1 tablet (25 mg total) by mouth 2 (two) times daily with a meal. 60 tablet 11  . carvedilol (COREG) 25 MG tablet Take 25 mg by mouth 2 (two) times daily with a meal.    . clopidogrel (PLAVIX) 75 MG tablet Take 1 tablet (75 mg total) by mouth daily with breakfast. 30 tablet 11  . FLUoxetine (PROZAC) 40 MG capsule Take 40 mg by mouth daily.    . hydrochlorothiazide (HYDRODIURIL) 12.5 MG tablet Take 12.5 mg by mouth daily.    . hydrochlorothiazide (MICROZIDE) 12.5 MG capsule Take 1 capsule (12.5 mg total) by mouth daily. 30 capsule 11  . HYDROmorphone (DILAUDID) 4 MG tablet Take by mouth every 4 (four) hours as needed for severe pain.    Marland Kitchen levothyroxine (SYNTHROID, LEVOTHROID) 25 MCG tablet Take 25 mcg by mouth daily.    Marland Kitchen losartan (COZAAR) 100 MG tablet Take 100 mg by mouth daily.    . metFORMIN (GLUCOPHAGE-XR) 500 MG 24 hr tablet Take 2,000 mg by mouth 2 (two) times daily.    . nitroGLYCERIN (NITROSTAT) 0.4 MG SL tablet Place 0.4 mg under the tongue every 5 (five) minutes as  needed. For chest pain     No current facility-administered medications for this visit.    Allergies:    Allergies  Allergen Reactions  . Sulfa Antibiotics Rash    Social History:  The patient  reports that he quit smoking about 47 years ago. His smoking use included Cigarettes. He smoked 0.00 packs per day. His smokeless tobacco use includes Chew. He reports that he does not drink alcohol or use illicit drugs.   ROS:  Please see the history of present illness.   Denies orthopnea, PND, palpitations, syncope, and edema   All other systems reviewed and negative.   OBJECTIVE: VS:  BP 144/76 mmHg  Pulse 58  Ht 5\' 9"  (1.753 m)  Wt 253 lb (114.76 kg)  BMI 37.34 kg/m2 Well nourished, well developed, in no acute distress, obese and older than stated age in appearance HEENT: normal Neck: JVD flat. Carotid  bruit absent  Cardiac:  normal S1, S2; RRR; no murmur Lungs:  clear to auscultation bilaterally, no wheezing, rhonchi or rales Abd: soft, nontender, no hepatomegaly Ext: Edema absent. Pulses 2+ Skin: warm and dry Neuro:  CNs 2-12 intact, no focal abnormalities noted  EKG:  Not performed       Signed, Illene Labrador III, MD 03/13/2014 1:02 PM

## 2014-03-13 NOTE — Patient Instructions (Signed)
Your physician recommends that you continue on your current medications as directed. Please refer to the Current Medication list given to you today.  Your physician discussed the importance of regular exercise and recommended that you start or continue a regular exercise program for good health.  Your physician wants you to follow-up in: 1 year with Dr.Smith You will receive a reminder letter in the mail two months in advance. If you don't receive a letter, please call our office to schedule the follow-up appointment.  

## 2014-03-23 ENCOUNTER — Encounter (HOSPITAL_COMMUNITY): Payer: Self-pay | Admitting: Interventional Cardiology

## 2014-05-04 DIAGNOSIS — L281 Prurigo nodularis: Secondary | ICD-10-CM | POA: Diagnosis not present

## 2014-05-12 DIAGNOSIS — L281 Prurigo nodularis: Secondary | ICD-10-CM | POA: Diagnosis not present

## 2014-05-12 DIAGNOSIS — E780 Pure hypercholesterolemia: Secondary | ICD-10-CM | POA: Diagnosis not present

## 2014-05-12 DIAGNOSIS — I2510 Atherosclerotic heart disease of native coronary artery without angina pectoris: Secondary | ICD-10-CM | POA: Diagnosis not present

## 2014-05-12 DIAGNOSIS — E039 Hypothyroidism, unspecified: Secondary | ICD-10-CM | POA: Diagnosis not present

## 2014-05-12 DIAGNOSIS — R1032 Left lower quadrant pain: Secondary | ICD-10-CM | POA: Diagnosis not present

## 2014-05-12 DIAGNOSIS — E1165 Type 2 diabetes mellitus with hyperglycemia: Secondary | ICD-10-CM | POA: Diagnosis not present

## 2014-05-19 DIAGNOSIS — M545 Low back pain: Secondary | ICD-10-CM | POA: Diagnosis not present

## 2014-05-19 DIAGNOSIS — M6281 Muscle weakness (generalized): Secondary | ICD-10-CM | POA: Diagnosis not present

## 2014-05-19 DIAGNOSIS — M79605 Pain in left leg: Secondary | ICD-10-CM | POA: Diagnosis not present

## 2014-05-19 DIAGNOSIS — M5126 Other intervertebral disc displacement, lumbar region: Secondary | ICD-10-CM | POA: Diagnosis not present

## 2014-05-19 DIAGNOSIS — M4327 Fusion of spine, lumbosacral region: Secondary | ICD-10-CM | POA: Diagnosis not present

## 2014-05-30 ENCOUNTER — Other Ambulatory Visit (HOSPITAL_COMMUNITY): Payer: Self-pay | Admitting: Neurosurgery

## 2014-05-30 DIAGNOSIS — M4726 Other spondylosis with radiculopathy, lumbar region: Secondary | ICD-10-CM | POA: Diagnosis not present

## 2014-05-30 DIAGNOSIS — M5136 Other intervertebral disc degeneration, lumbar region: Secondary | ICD-10-CM | POA: Diagnosis not present

## 2014-05-30 DIAGNOSIS — M5126 Other intervertebral disc displacement, lumbar region: Secondary | ICD-10-CM | POA: Insufficient documentation

## 2014-05-30 DIAGNOSIS — M549 Dorsalgia, unspecified: Secondary | ICD-10-CM | POA: Diagnosis not present

## 2014-05-30 DIAGNOSIS — M5416 Radiculopathy, lumbar region: Secondary | ICD-10-CM | POA: Diagnosis not present

## 2014-05-30 DIAGNOSIS — Z6838 Body mass index (BMI) 38.0-38.9, adult: Secondary | ICD-10-CM | POA: Diagnosis not present

## 2014-05-30 HISTORY — DX: Other intervertebral disc displacement, lumbar region: M51.26

## 2014-06-06 ENCOUNTER — Encounter (HOSPITAL_COMMUNITY): Payer: Self-pay

## 2014-06-06 NOTE — Pre-Procedure Instructions (Signed)
[name]  06/06/2014   Your procedure is scheduled on:  Wed, Mar 2 @ 12:05 PM  Report to [facility]  at 9:00 AM.  Call this number if you have problems the morning of surgery: [phone]   Remember:   Do not eat food or drink liquids after midnight.   Take these medicines the morning of surgery with A SIP OF WATER: Amlodipine(Norvasc),Carvedilol(Coreg),Prozac(Fluoxetine),and Synthroid(Levothyroxine)               Stop taking your Plavix and Aspirin a week before surgery. No Goody's,BC's,Aleve,Ibuprofen,Fish Oil,or any Herbal Medications.   Do not wear jewelry.  Do not wear lotions, powders, or colognes. You may wear deodorant.  Men may shave face and neck.  Do not bring valuables to the hospital.  [facility] is not responsible                  for any belongings or valuables.               Contacts, dentures or bridgework may not be worn into surgery.  Leave suitcase in the car. After surgery it may be brought to your room.  For patients admitted to the hospital, discharge time is determined by your                treatment team.                 Special Instructions:  [place] - Preparing for Surgery  Before surgery, you can play an important role.  Because skin is not sterile, your skin needs to be as free of germs as possible.  You can reduce the number of germs on you skin by washing with CHG (chlorahexidine gluconate) soap before surgery.  CHG is an antiseptic cleaner which kills germs and bonds with the skin to continue killing germs even after washing.  Please DO NOT use if you have an allergy to CHG or antibacterial soaps.  If your skin becomes reddened/irritated stop using the CHG and inform your nurse when you arrive at Short Stay.  Do not shave (including legs and underarms) for at least 48 hours prior to the first CHG shower.  You may shave your face.  Please follow these instructions carefully:   1.  Shower with CHG Soap the night before surgery  and the                                morning of Surgery.  2.  If you choose to wash your hair, wash your hair first as usual with your       normal shampoo.  3.  After you shampoo, rinse your hair and body thoroughly to remove the                      Shampoo.  4.  Use CHG as you would any other liquid soap.  You can apply chg directly       to the skin and wash gently with scrungie or a clean washcloth.  5.  Apply the CHG Soap to your body ONLY FROM THE NECK DOWN.        Do not use on open wounds or open sores.  Avoid contact with your eyes,       ears, mouth and genitals (private parts).  Wash genitals (private parts)       with  your normal soap.  6.  Wash thoroughly, paying special attention to the area where your surgery        will be performed.  7.  Thoroughly rinse your body with warm water from the neck down.  8.  DO NOT shower/wash with your normal soap after using and rinsing off       the CHG Soap.  9.  Pat yourself dry with a clean towel.            10.  Wear clean pajamas.            11.  Place clean sheets on your bed the night of your first shower and do not        sleep with pets.  Day of Surgery  Do not apply any lotions/deoderants the morning of surgery.  Please wear clean clothes to the hospital/surgery center.     Please read over the following fact sheets that you were given: Pain Booklet, Coughing and Deep Breathing, MRSA Information and Surgical Site Infection Prevention

## 2014-06-07 ENCOUNTER — Encounter (HOSPITAL_COMMUNITY): Payer: Self-pay

## 2014-06-07 ENCOUNTER — Encounter (HOSPITAL_COMMUNITY)
Admission: RE | Admit: 2014-06-07 | Discharge: 2014-06-07 | Disposition: A | Discharge status: Home / Self Care | Payer: Medicare Other | Source: Ambulatory Visit | Attending: Neurosurgery | Admitting: Neurosurgery

## 2014-06-07 DIAGNOSIS — I2510 Atherosclerotic heart disease of native coronary artery without angina pectoris: Secondary | ICD-10-CM | POA: Diagnosis not present

## 2014-06-07 DIAGNOSIS — Z01812 Encounter for preprocedural laboratory examination: Secondary | ICD-10-CM | POA: Diagnosis not present

## 2014-06-07 DIAGNOSIS — Z01818 Encounter for other preprocedural examination: Secondary | ICD-10-CM | POA: Insufficient documentation

## 2014-06-07 DIAGNOSIS — M5126 Other intervertebral disc displacement, lumbar region: Secondary | ICD-10-CM | POA: Diagnosis not present

## 2014-06-07 DIAGNOSIS — Z87891 Personal history of nicotine dependence: Secondary | ICD-10-CM | POA: Diagnosis not present

## 2014-06-07 DIAGNOSIS — Z7902 Long term (current) use of antithrombotics/antiplatelets: Secondary | ICD-10-CM | POA: Insufficient documentation

## 2014-06-07 DIAGNOSIS — K219 Gastro-esophageal reflux disease without esophagitis: Secondary | ICD-10-CM | POA: Diagnosis not present

## 2014-06-07 DIAGNOSIS — Z7982 Long term (current) use of aspirin: Secondary | ICD-10-CM | POA: Diagnosis not present

## 2014-06-07 DIAGNOSIS — I10 Essential (primary) hypertension: Secondary | ICD-10-CM | POA: Insufficient documentation

## 2014-06-07 DIAGNOSIS — E785 Hyperlipidemia, unspecified: Secondary | ICD-10-CM | POA: Insufficient documentation

## 2014-06-07 DIAGNOSIS — E119 Type 2 diabetes mellitus without complications: Secondary | ICD-10-CM | POA: Insufficient documentation

## 2014-06-07 HISTORY — DX: Hyperlipidemia, unspecified: E78.5

## 2014-06-07 HISTORY — DX: Personal history of colonic polyps: Z86.010

## 2014-06-07 HISTORY — DX: Unspecified hearing loss, unspecified ear: H91.90

## 2014-06-07 HISTORY — DX: Reserved for inherently not codable concepts without codable children: IMO0001

## 2014-06-07 HISTORY — DX: Irritable bowel syndrome, unspecified: K58.9

## 2014-06-07 HISTORY — DX: Personal history of colon polyps, unspecified: Z86.0100

## 2014-06-07 HISTORY — DX: Hypothyroidism, unspecified: E03.9

## 2014-06-07 HISTORY — DX: Personal history of urinary calculi: Z87.442

## 2014-06-07 HISTORY — DX: Dorsalgia, unspecified: M54.9

## 2014-06-07 HISTORY — DX: Other chronic pain: G89.29

## 2014-06-07 HISTORY — DX: Diverticulitis of intestine, part unspecified, without perforation or abscess without bleeding: K57.92

## 2014-06-07 HISTORY — DX: Weakness: R53.1

## 2014-06-07 HISTORY — DX: Dizziness and giddiness: R42

## 2014-06-07 LAB — CBC
HCT: 42.1 % (ref 39.0–52.0)
Hemoglobin: 14.5 g/dL (ref 13.0–17.0)
MCH: 29.8 pg (ref 26.0–34.0)
MCHC: 34.4 g/dL (ref 30.0–36.0)
MCV: 86.4 fL (ref 78.0–100.0)
PLATELETS: 180 10*3/uL (ref 150–400)
RBC: 4.87 MIL/uL (ref 4.22–5.81)
RDW: 13.2 % (ref 11.5–15.5)
WBC: 6.8 10*3/uL (ref 4.0–10.5)

## 2014-06-07 LAB — BASIC METABOLIC PANEL
Anion gap: 8 (ref 5–15)
BUN: 13 mg/dL (ref 6–23)
CHLORIDE: 101 mmol/L (ref 96–112)
CO2: 29 mmol/L (ref 19–32)
Calcium: 9.8 mg/dL (ref 8.4–10.5)
Creatinine, Ser: 0.75 mg/dL (ref 0.50–1.35)
GFR calc Af Amer: >90 mL/min (ref >90)
Glucose, Bld: 226 mg/dL — ABNORMAL HIGH (ref 70–99)
Potassium: 4.8 mmol/L (ref 3.5–5.1)
Sodium: 138 mmol/L (ref 135–145)

## 2014-06-07 LAB — SURGICAL PCR SCREEN
MRSA, PCR: NEGATIVE
Staphylococcus aureus: NEGATIVE

## 2014-06-07 NOTE — Progress Notes (Signed)
   06/07/14 0835  OBSTRUCTIVE SLEEP APNEA  Have you ever been diagnosed with sleep apnea through a sleep study? No  Do you snore loudly (loud enough to be heard through closed doors)?  1  Do you often feel tired, fatigued, or sleepy during the daytime? 0  Has anyone observed you stop breathing during your sleep? 0  Do you have, or are you being treated for high blood pressure? 1  BMI more than 35 kg/m2? 1  Age over 67 years old? 1  Neck circumference greater than 40 cm/16 inches? 1  Gender: 1

## 2014-06-07 NOTE — Progress Notes (Addendum)
Cardiologist is Dr.Henry Smith-last visit in epic from 03-13-14  Stress test in epic from 2015  Heart cath in epic from 2013  EKG in epic from 12-27-13  Medical Md is with [facility] and sees PA Cyndi Bender

## 2014-06-08 NOTE — Progress Notes (Signed)
Anesthesia Chart Review:  Pt is 69 year old male scheduled for L3-4 laminectomy/decompression microdiscectomy on 06/14/2014 with Dr. Sherwood Gambler.   Cardiologist is Dr. Tamala Julian. PCP is Cyndi Bender, PA-C.   PMH includes:  CAD (pLAD 50%, mLAD 30-60%, mLAD 95%, OM1 99%, dPDA 95%, mRCA 70%, EF 70%. The mLAD was tx with a Promus DES.), HTN, DM, hyperlipidemia, tuberculosis, GERD. Former smoker. BMI 39  Medication includes: plavix and asa. Pt to stop taking a week prior to surgery.   Preoperative labs reviewed.  Glucose 226.   EKG 12/27/2013: sinus bradycardia (54 bpm).   Stress test 12/28/2013: 1. No reversible ischemia or infarction. 2. Normal left ventricular wall motion. 3. Left ventricular ejection fraction 67% 4. Low-risk stress test findings  Cardiac cath 08/21/2011: 1. Multivessel coronary disease with high-grade distal LAD obstruction, moderate proximal LAD obstruction, severe first obtuse marginal stenosis, moderate mid RCA stenosis, and distal PDA disease. 2. Apical ischemia due to the high-grade mid LAD stenosis. 3. Successful DES mid LAD from 99% to 0% with TIMI grade 3 flow. 4. Unsuccessful attempt at obtuse marginal #1 PCI D2 medical issues. If this vessel/lesion is reattempted, we probably have better success going from the femoral approach or left radial approach. 5. Normal LV function  Per Dr. Thompson Caul cardiology consult note 09/01/2011, "the PDA and OM could be treated with PCI but it also seems that medical therapy could work since the territories are small".   If no changes, I anticipate pt can proceed with surgery as scheduled.    Willeen Cass, FNP-BC [facility]/Anesthesiology Phone: [phone] 06/08/2014 4:07 PM

## 2014-06-13 MED ORDER — CEFAZOLIN SODIUM-DEXTROSE 2-3 GM-% IV SOLR
2.0000 g | INTRAVENOUS | Status: AC
  Administered 2014-06-14: 2 g via INTRAVENOUS
  Filled 2014-06-13: qty 50

## 2014-06-14 ENCOUNTER — Ambulatory Visit (HOSPITAL_COMMUNITY): Payer: Medicare Other | Admitting: Anesthesiology

## 2014-06-14 ENCOUNTER — Ambulatory Visit (HOSPITAL_COMMUNITY): Payer: Medicare Other | Admitting: Emergency Medicine

## 2014-06-14 ENCOUNTER — Observation Stay (HOSPITAL_COMMUNITY)
Admission: RE | Admit: 2014-06-14 | Discharge: 2014-06-15 | Disposition: A | Discharge status: Home / Self Care | Payer: Medicare Other | Source: Ambulatory Visit | Attending: Neurosurgery | Admitting: Neurosurgery

## 2014-06-14 ENCOUNTER — Encounter (HOSPITAL_COMMUNITY): Payer: Self-pay | Admitting: *Deleted

## 2014-06-14 ENCOUNTER — Ambulatory Visit (HOSPITAL_COMMUNITY): Payer: Medicare Other

## 2014-06-14 ENCOUNTER — Encounter (HOSPITAL_COMMUNITY)
Admission: RE | Disposition: A | Discharge status: Home / Self Care | Payer: Self-pay | Source: Ambulatory Visit | Attending: Neurosurgery

## 2014-06-14 DIAGNOSIS — K219 Gastro-esophageal reflux disease without esophagitis: Secondary | ICD-10-CM | POA: Insufficient documentation

## 2014-06-14 DIAGNOSIS — K589 Irritable bowel syndrome without diarrhea: Secondary | ICD-10-CM | POA: Insufficient documentation

## 2014-06-14 DIAGNOSIS — Z882 Allergy status to sulfonamides status: Secondary | ICD-10-CM | POA: Diagnosis not present

## 2014-06-14 DIAGNOSIS — I2510 Atherosclerotic heart disease of native coronary artery without angina pectoris: Secondary | ICD-10-CM | POA: Diagnosis not present

## 2014-06-14 DIAGNOSIS — Z4789 Encounter for other orthopedic aftercare: Secondary | ICD-10-CM | POA: Diagnosis not present

## 2014-06-14 DIAGNOSIS — F329 Major depressive disorder, single episode, unspecified: Secondary | ICD-10-CM | POA: Diagnosis not present

## 2014-06-14 DIAGNOSIS — F419 Anxiety disorder, unspecified: Secondary | ICD-10-CM | POA: Diagnosis not present

## 2014-06-14 DIAGNOSIS — M5116 Intervertebral disc disorders with radiculopathy, lumbar region: Secondary | ICD-10-CM | POA: Diagnosis not present

## 2014-06-14 DIAGNOSIS — M5126 Other intervertebral disc displacement, lumbar region: Secondary | ICD-10-CM

## 2014-06-14 DIAGNOSIS — M4316 Spondylolisthesis, lumbar region: Secondary | ICD-10-CM | POA: Diagnosis not present

## 2014-06-14 DIAGNOSIS — Z87891 Personal history of nicotine dependence: Secondary | ICD-10-CM | POA: Diagnosis not present

## 2014-06-14 DIAGNOSIS — Z7982 Long term (current) use of aspirin: Secondary | ICD-10-CM | POA: Diagnosis not present

## 2014-06-14 DIAGNOSIS — M5136 Other intervertebral disc degeneration, lumbar region: Secondary | ICD-10-CM | POA: Diagnosis not present

## 2014-06-14 DIAGNOSIS — I25110 Atherosclerotic heart disease of native coronary artery with unstable angina pectoris: Secondary | ICD-10-CM | POA: Diagnosis not present

## 2014-06-14 DIAGNOSIS — E119 Type 2 diabetes mellitus without complications: Secondary | ICD-10-CM | POA: Insufficient documentation

## 2014-06-14 DIAGNOSIS — Z87442 Personal history of urinary calculi: Secondary | ICD-10-CM | POA: Diagnosis not present

## 2014-06-14 DIAGNOSIS — Z419 Encounter for procedure for purposes other than remedying health state, unspecified: Secondary | ICD-10-CM

## 2014-06-14 DIAGNOSIS — I10 Essential (primary) hypertension: Secondary | ICD-10-CM | POA: Insufficient documentation

## 2014-06-14 DIAGNOSIS — Z79899 Other long term (current) drug therapy: Secondary | ICD-10-CM | POA: Insufficient documentation

## 2014-06-14 DIAGNOSIS — E785 Hyperlipidemia, unspecified: Secondary | ICD-10-CM | POA: Diagnosis not present

## 2014-06-14 DIAGNOSIS — Z86010 Personal history of colonic polyps: Secondary | ICD-10-CM | POA: Insufficient documentation

## 2014-06-14 HISTORY — DX: Other intervertebral disc displacement, lumbar region: M51.26

## 2014-06-14 HISTORY — PX: LUMBAR LAMINECTOMY/DECOMPRESSION MICRODISCECTOMY: SHX5026

## 2014-06-14 LAB — GLUCOSE, CAPILLARY
Glucose-Capillary: 180 mg/dL — ABNORMAL HIGH (ref 70–99)
Glucose-Capillary: 155 mg/dL — ABNORMAL HIGH (ref 70–99)
Glucose-Capillary: 150 mg/dL — ABNORMAL HIGH (ref 70–99)
Glucose-Capillary: 126 mg/dL — ABNORMAL HIGH (ref 70–99)
Glucose-Capillary: 140 mg/dL — ABNORMAL HIGH (ref 70–99)

## 2014-06-14 SURGERY — LUMBAR LAMINECTOMY/DECOMPRESSION MICRODISCECTOMY 1 LEVEL
Anesthesia: General | Site: Spine Lumbar | Laterality: Left

## 2014-06-14 MED ORDER — KETOROLAC TROMETHAMINE 30 MG/ML IJ SOLN
30.0000 mg | Freq: Four times a day (QID) | INTRAMUSCULAR | Status: DC
  Administered 2014-06-14 – 2014-06-15 (×3): 30 mg via INTRAVENOUS
  Filled 2014-06-14 (×7): qty 1

## 2014-06-14 MED ORDER — ARTIFICIAL TEARS OP OINT
TOPICAL_OINTMENT | OPHTHALMIC | Status: DC | PRN
  Administered 2014-06-14: 1 via OPHTHALMIC

## 2014-06-14 MED ORDER — HEMOSTATIC AGENTS (NO CHARGE) OPTIME
TOPICAL | Status: DC | PRN
  Administered 2014-06-14: 1 via TOPICAL

## 2014-06-14 MED ORDER — ROCURONIUM BROMIDE 100 MG/10ML IV SOLN
INTRAVENOUS | Status: DC | PRN
  Administered 2014-06-14: 50 mg via INTRAVENOUS

## 2014-06-14 MED ORDER — OXYCODONE HCL 5 MG PO TABS: 5.0000 mg | ORAL_TABLET | Freq: Once | ORAL | Status: DC | PRN

## 2014-06-14 MED ORDER — HYDROXYZINE HCL 25 MG PO TABS: 50.0000 mg | ORAL_TABLET | ORAL | Status: DC | PRN

## 2014-06-14 MED ORDER — ALUM & MAG HYDROXIDE-SIMETH 200-200-20 MG/5ML PO SUSP: 30.0000 mL | Freq: Four times a day (QID) | ORAL | Status: DC | PRN

## 2014-06-14 MED ORDER — ACETAMINOPHEN 325 MG PO TABS: 650.0000 mg | ORAL_TABLET | ORAL | Status: DC | PRN

## 2014-06-14 MED ORDER — MIDAZOLAM HCL 5 MG/5ML IJ SOLN
INTRAMUSCULAR | Status: DC | PRN
  Administered 2014-06-14: 2 mg via INTRAVENOUS

## 2014-06-14 MED ORDER — ACETAMINOPHEN 10 MG/ML IV SOLN
INTRAVENOUS | Status: AC
Start: 2014-06-14 — End: 2014-06-14
  Administered 2014-06-14: 1000 mg via INTRAVENOUS
  Filled 2014-06-14: qty 100

## 2014-06-14 MED ORDER — FENTANYL CITRATE 0.05 MG/ML IJ SOLN
INTRAMUSCULAR | Status: AC
  Filled 2014-06-14: qty 2

## 2014-06-14 MED ORDER — HYDROCODONE-ACETAMINOPHEN 5-325 MG PO TABS
1.0000 | ORAL_TABLET | ORAL | Status: DC | PRN
  Administered 2014-06-15: 2 via ORAL
  Filled 2014-06-14: qty 2

## 2014-06-14 MED ORDER — KETOROLAC TROMETHAMINE 30 MG/ML IJ SOLN
INTRAMUSCULAR | Status: AC
  Administered 2014-06-14: 30 mg via INTRAVENOUS
  Filled 2014-06-14: qty 1

## 2014-06-14 MED ORDER — AMLODIPINE BESYLATE 10 MG PO TABS
10.0000 mg | ORAL_TABLET | Freq: Every day | ORAL | Status: DC
  Administered 2014-06-15: 10 mg via ORAL
  Filled 2014-06-14: qty 1

## 2014-06-14 MED ORDER — CARVEDILOL 25 MG PO TABS
25.0000 mg | ORAL_TABLET | Freq: Two times a day (BID) | ORAL | Status: DC
  Administered 2014-06-14 – 2014-06-15 (×2): 25 mg via ORAL
  Filled 2014-06-14 (×4): qty 1

## 2014-06-14 MED ORDER — FLUOXETINE HCL 20 MG PO CAPS
40.0000 mg | ORAL_CAPSULE | Freq: Every day | ORAL | Status: DC
  Administered 2014-06-14 – 2014-06-15 (×2): 40 mg via ORAL
  Filled 2014-06-14 (×2): qty 2

## 2014-06-14 MED ORDER — PROPOFOL 10 MG/ML IV BOLUS
INTRAVENOUS | Status: DC | PRN
  Administered 2014-06-14: 130 mg via INTRAVENOUS

## 2014-06-14 MED ORDER — FENTANYL CITRATE 0.05 MG/ML IJ SOLN
INTRAMUSCULAR | Status: DC | PRN
Start: 2014-06-14 — End: 2014-06-14
  Administered 2014-06-14: 50 ug via INTRAVENOUS
  Administered 2014-06-14: 100 ug via INTRAVENOUS
  Administered 2014-06-14: 50 ug via INTRAVENOUS

## 2014-06-14 MED ORDER — LIDOCAINE HCL 4 % MT SOLN
OROMUCOSAL | Status: DC | PRN
  Administered 2014-06-14: 4 mL via TOPICAL

## 2014-06-14 MED ORDER — LIDOCAINE-EPINEPHRINE 1 %-1:100000 IJ SOLN
INTRAMUSCULAR | Status: DC | PRN
  Administered 2014-06-14: 10 mL

## 2014-06-14 MED ORDER — FENTANYL CITRATE 0.05 MG/ML IJ SOLN
INTRAMUSCULAR | Status: DC | PRN
  Administered 2014-06-14: 100 ug via INTRAVENOUS

## 2014-06-14 MED ORDER — LOSARTAN POTASSIUM 50 MG PO TABS
100.0000 mg | ORAL_TABLET | Freq: Every day | ORAL | Status: DC
  Administered 2014-06-15: 100 mg via ORAL
  Filled 2014-06-14: qty 2

## 2014-06-14 MED ORDER — ONDANSETRON HCL 4 MG/2ML IJ SOLN: 4.0000 mg | Freq: Four times a day (QID) | INTRAMUSCULAR | Status: DC | PRN

## 2014-06-14 MED ORDER — LIDOCAINE HCL (CARDIAC) 20 MG/ML IV SOLN
INTRAVENOUS | Status: DC | PRN
  Administered 2014-06-14: 60 mg via INTRAVENOUS

## 2014-06-14 MED ORDER — PHENYLEPHRINE HCL 10 MG/ML IJ SOLN
INTRAMUSCULAR | Status: DC | PRN
  Administered 2014-06-14 (×4): 40 ug via INTRAVENOUS

## 2014-06-14 MED ORDER — GLYCOPYRROLATE 0.2 MG/ML IJ SOLN
INTRAMUSCULAR | Status: DC | PRN
  Administered 2014-06-14: 0.6 mg via INTRAVENOUS

## 2014-06-14 MED ORDER — LACTATED RINGERS IV SOLN
INTRAVENOUS | Status: DC
  Administered 2014-06-14 (×2): via INTRAVENOUS

## 2014-06-14 MED ORDER — HYDROMORPHONE HCL 1 MG/ML IJ SOLN
INTRAMUSCULAR | Status: AC
  Filled 2014-06-14: qty 1

## 2014-06-14 MED ORDER — CYCLOBENZAPRINE HCL 10 MG PO TABS: 10.0000 mg | ORAL_TABLET | Freq: Three times a day (TID) | ORAL | Status: DC | PRN

## 2014-06-14 MED ORDER — ACETAMINOPHEN 650 MG RE SUPP: 650.0000 mg | RECTAL | Status: DC | PRN

## 2014-06-14 MED ORDER — ARTIFICIAL TEARS OP OINT
TOPICAL_OINTMENT | OPHTHALMIC | Status: AC
  Filled 2014-06-14: qty 3.5

## 2014-06-14 MED ORDER — GLYCOPYRROLATE 0.2 MG/ML IJ SOLN
INTRAMUSCULAR | Status: AC
  Filled 2014-06-14: qty 3

## 2014-06-14 MED ORDER — METFORMIN HCL ER 500 MG PO TB24
500.0000 mg | ORAL_TABLET | Freq: Two times a day (BID) | ORAL | Status: DC
  Administered 2014-06-14 – 2014-06-15 (×2): 500 mg via ORAL
  Filled 2014-06-14 (×4): qty 1

## 2014-06-14 MED ORDER — BISACODYL 10 MG RE SUPP
10.0000 mg | Freq: Every day | RECTAL | Status: DC | PRN
Start: 2014-06-14 — End: 2014-06-15

## 2014-06-14 MED ORDER — OXYCODONE-ACETAMINOPHEN 5-325 MG PO TABS: 1.0000 | ORAL_TABLET | ORAL | Status: DC | PRN

## 2014-06-14 MED ORDER — KETOROLAC TROMETHAMINE 30 MG/ML IJ SOLN
30.0000 mg | Freq: Once | INTRAMUSCULAR | Status: AC
  Administered 2014-06-14: 30 mg via INTRAVENOUS

## 2014-06-14 MED ORDER — PHENOL 1.4 % MT LIQD: 1.0000 | OROMUCOSAL | Status: DC | PRN

## 2014-06-14 MED ORDER — EPHEDRINE SULFATE 50 MG/ML IJ SOLN
INTRAMUSCULAR | Status: DC | PRN
  Administered 2014-06-14: 10 mg via INTRAVENOUS

## 2014-06-14 MED ORDER — ATORVASTATIN CALCIUM 20 MG PO TABS
20.0000 mg | ORAL_TABLET | Freq: Every day | ORAL | Status: DC
  Administered 2014-06-14 – 2014-06-15 (×2): 20 mg via ORAL
  Filled 2014-06-14 (×2): qty 1

## 2014-06-14 MED ORDER — HYDROCHLOROTHIAZIDE 12.5 MG PO CAPS
12.5000 mg | ORAL_CAPSULE | Freq: Every day | ORAL | Status: DC
  Administered 2014-06-14 – 2014-06-15 (×2): 12.5 mg via ORAL
  Filled 2014-06-14 (×2): qty 1

## 2014-06-14 MED ORDER — NITROGLYCERIN 0.4 MG SL SUBL: 0.4000 mg | SUBLINGUAL_TABLET | SUBLINGUAL | Status: DC | PRN

## 2014-06-14 MED ORDER — SUCCINYLCHOLINE CHLORIDE 20 MG/ML IJ SOLN
INTRAMUSCULAR | Status: DC | PRN
  Administered 2014-06-14: 100 mg via INTRAVENOUS

## 2014-06-14 MED ORDER — ONDANSETRON HCL 4 MG/2ML IJ SOLN
INTRAMUSCULAR | Status: AC
  Filled 2014-06-14: qty 4

## 2014-06-14 MED ORDER — SODIUM CHLORIDE 0.9 % IV SOLN
INTRAVENOUS | Status: DC
Start: 2014-06-14 — End: 2014-06-15

## 2014-06-14 MED ORDER — SODIUM CHLORIDE 0.9 % IJ SOLN
3.0000 mL | Freq: Two times a day (BID) | INTRAMUSCULAR | Status: DC
Start: 2014-06-14 — End: 2014-06-15
  Administered 2014-06-14: 3 mL via INTRAVENOUS

## 2014-06-14 MED ORDER — HYDROMORPHONE HCL 1 MG/ML IJ SOLN
0.2500 mg | INTRAMUSCULAR | Status: DC | PRN
  Administered 2014-06-14 (×3): 0.5 mg via INTRAVENOUS

## 2014-06-14 MED ORDER — SODIUM CHLORIDE 0.9 % IR SOLN
Status: DC | PRN
  Administered 2014-06-14: 500 mL

## 2014-06-14 MED ORDER — THROMBIN 5000 UNITS EX SOLR
CUTANEOUS | Status: DC | PRN
  Administered 2014-06-14 (×2): 5000 [IU] via TOPICAL

## 2014-06-14 MED ORDER — FENTANYL CITRATE 0.05 MG/ML IJ SOLN
INTRAMUSCULAR | Status: AC
  Filled 2014-06-14: qty 5

## 2014-06-14 MED ORDER — 0.9 % SODIUM CHLORIDE (POUR BTL) OPTIME
TOPICAL | Status: DC | PRN
  Administered 2014-06-14: 1000 mL

## 2014-06-14 MED ORDER — LEVOTHYROXINE SODIUM 25 MCG PO TABS
25.0000 ug | ORAL_TABLET | Freq: Every day | ORAL | Status: DC
  Administered 2014-06-14: 25 ug via ORAL
  Filled 2014-06-14 (×4): qty 1

## 2014-06-14 MED ORDER — SODIUM CHLORIDE 0.9 % IJ SOLN: 3.0000 mL | INTRAMUSCULAR | Status: DC | PRN

## 2014-06-14 MED ORDER — GABAPENTIN 300 MG PO CAPS
300.0000 mg | ORAL_CAPSULE | Freq: Three times a day (TID) | ORAL | Status: DC
  Administered 2014-06-14 – 2014-06-15 (×3): 300 mg via ORAL
  Filled 2014-06-14 (×5): qty 1

## 2014-06-14 MED ORDER — PHENYLEPHRINE 40 MCG/ML (10ML) SYRINGE FOR IV PUSH (FOR BLOOD PRESSURE SUPPORT)
PREFILLED_SYRINGE | INTRAVENOUS | Status: AC
  Filled 2014-06-14: qty 10

## 2014-06-14 MED ORDER — HYDROCHLOROTHIAZIDE 25 MG PO TABS
12.5000 mg | ORAL_TABLET | Freq: Every day | ORAL | Status: DC
  Filled 2014-06-14: qty 0.5

## 2014-06-14 MED ORDER — MORPHINE SULFATE 4 MG/ML IJ SOLN
4.0000 mg | INTRAMUSCULAR | Status: DC | PRN
  Administered 2014-06-14: 4 mg via INTRAMUSCULAR
  Filled 2014-06-14: qty 1

## 2014-06-14 MED ORDER — MIDAZOLAM HCL 2 MG/2ML IJ SOLN
INTRAMUSCULAR | Status: AC
  Filled 2014-06-14: qty 2

## 2014-06-14 MED ORDER — BUPIVACAINE HCL (PF) 0.5 % IJ SOLN
INTRAMUSCULAR | Status: DC | PRN
  Administered 2014-06-14: 10 mL

## 2014-06-14 MED ORDER — PROPOFOL 10 MG/ML IV BOLUS
INTRAVENOUS | Status: AC
  Filled 2014-06-14: qty 20

## 2014-06-14 MED ORDER — PROMETHAZINE HCL 25 MG/ML IJ SOLN: 6.2500 mg | INTRAMUSCULAR | Status: DC | PRN

## 2014-06-14 MED ORDER — MAGNESIUM HYDROXIDE 400 MG/5ML PO SUSP: 30.0000 mL | Freq: Every day | ORAL | Status: DC | PRN

## 2014-06-14 MED ORDER — MENTHOL 3 MG MT LOZG: 1.0000 | LOZENGE | OROMUCOSAL | Status: DC | PRN

## 2014-06-14 MED ORDER — NEOSTIGMINE METHYLSULFATE 10 MG/10ML IV SOLN
INTRAVENOUS | Status: DC | PRN
  Administered 2014-06-14: 4 mg via INTRAVENOUS

## 2014-06-14 MED ORDER — OXYCODONE HCL 5 MG/5ML PO SOLN: 5.0000 mg | Freq: Once | ORAL | Status: DC | PRN

## 2014-06-14 MED ORDER — ONDANSETRON HCL 4 MG/2ML IJ SOLN
INTRAMUSCULAR | Status: DC | PRN
  Administered 2014-06-14: 4 mg via INTRAVENOUS

## 2014-06-14 MED ORDER — HYDROXYZINE HCL 50 MG/ML IM SOLN: 50.0000 mg | INTRAMUSCULAR | Status: DC | PRN

## 2014-06-14 SURGICAL SUPPLY — 66 items
ADH SKN CLS LQ APL DERMABOND (GAUZE/BANDAGES/DRESSINGS) ×1
APL SKNCLS STERI-STRIP NONHPOA (GAUZE/BANDAGES/DRESSINGS)
BAG DECANTER FOR FLEXI CONT (MISCELLANEOUS) ×2 IMPLANT
BENZOIN TINCTURE PRP APPL 2/3 (GAUZE/BANDAGES/DRESSINGS) IMPLANT
BLADE CLIPPER SURG (BLADE) ×1 IMPLANT
BRUSH SCRUB EZ PLAIN DRY (MISCELLANEOUS) ×2 IMPLANT
BUR ACORN 6.0 ACORN (BURR) IMPLANT
BUR ACRON 5.0MM COATED (BURR) ×1 IMPLANT
BUR MATCHSTICK NEURO 3.0 LAGG (BURR) ×2 IMPLANT
CANISTER SUCT 3000ML PPV (MISCELLANEOUS) ×2 IMPLANT
CONT SPEC 4OZ CLIKSEAL STRL BL (MISCELLANEOUS) ×1 IMPLANT
DERMABOND ADHESIVE PROPEN (GAUZE/BANDAGES/DRESSINGS) ×1
DERMABOND ADVANCED .7 DNX6 (GAUZE/BANDAGES/DRESSINGS) IMPLANT
DRAPE LAPAROTOMY 100X72X124 (DRAPES) ×2 IMPLANT
DRAPE MICROSCOPE LEICA (MISCELLANEOUS) ×2 IMPLANT
DRAPE POUCH INSTRU U-SHP 10X18 (DRAPES) ×2 IMPLANT
DRSG EMULSION OIL 3X3 NADH (GAUZE/BANDAGES/DRESSINGS) IMPLANT
ELECT REM PT RETURN 9FT ADLT (ELECTROSURGICAL) ×2
ELECTRODE REM PT RTRN 9FT ADLT (ELECTROSURGICAL) ×1 IMPLANT
GAUZE SPONGE 4X4 12PLY STRL (GAUZE/BANDAGES/DRESSINGS) ×1 IMPLANT
GAUZE SPONGE 4X4 16PLY XRAY LF (GAUZE/BANDAGES/DRESSINGS) IMPLANT
GLOVE BIO SURGEON STRL SZ8 (GLOVE) ×1 IMPLANT
GLOVE BIOGEL PI IND STRL 7.5 (GLOVE) IMPLANT
GLOVE BIOGEL PI IND STRL 8 (GLOVE) ×1 IMPLANT
GLOVE BIOGEL PI INDICATOR 7.5 (GLOVE) ×1
GLOVE BIOGEL PI INDICATOR 8 (GLOVE) ×4
GLOVE ECLIPSE 7.5 STRL STRAW (GLOVE) ×7 IMPLANT
GLOVE EXAM NITRILE LRG STRL (GLOVE) IMPLANT
GLOVE EXAM NITRILE MD LF STRL (GLOVE) IMPLANT
GLOVE EXAM NITRILE XL STR (GLOVE) IMPLANT
GLOVE EXAM NITRILE XS STR PU (GLOVE) IMPLANT
GLOVE INDICATOR 8.5 STRL (GLOVE) ×1 IMPLANT
GOWN STRL REUS W/ TWL LRG LVL3 (GOWN DISPOSABLE) ×1 IMPLANT
GOWN STRL REUS W/ TWL XL LVL3 (GOWN DISPOSABLE) IMPLANT
GOWN STRL REUS W/TWL 2XL LVL3 (GOWN DISPOSABLE) ×2 IMPLANT
GOWN STRL REUS W/TWL LRG LVL3 (GOWN DISPOSABLE)
GOWN STRL REUS W/TWL XL LVL3 (GOWN DISPOSABLE) ×6
KIT BASIN OR (CUSTOM PROCEDURE TRAY) ×2 IMPLANT
KIT ROOM TURNOVER OR (KITS) ×2 IMPLANT
LIQUID BAND (GAUZE/BANDAGES/DRESSINGS) IMPLANT
NDL HYPO 18GX1.5 BLUNT FILL (NEEDLE) IMPLANT
NDL SPNL 18GX3.5 QUINCKE PK (NEEDLE) ×1 IMPLANT
NDL SPNL 22GX3.5 QUINCKE BK (NEEDLE) ×1 IMPLANT
NEEDLE HYPO 18GX1.5 BLUNT FILL (NEEDLE) ×2 IMPLANT
NEEDLE SPNL 18GX3.5 QUINCKE PK (NEEDLE) ×2 IMPLANT
NEEDLE SPNL 22GX3.5 QUINCKE BK (NEEDLE) ×2 IMPLANT
NS IRRIG 1000ML POUR BTL (IV SOLUTION) ×2 IMPLANT
PACK LAMINECTOMY NEURO (CUSTOM PROCEDURE TRAY) ×2 IMPLANT
PAD ARMBOARD 7.5X6 YLW CONV (MISCELLANEOUS) ×8 IMPLANT
PATTIES SURGICAL .5 X1 (DISPOSABLE) ×1 IMPLANT
RUBBERBAND STERILE (MISCELLANEOUS) ×4 IMPLANT
SPONGE LAP 4X18 X RAY DECT (DISPOSABLE) IMPLANT
SPONGE SURGIFOAM ABS GEL SZ50 (HEMOSTASIS) ×2 IMPLANT
STRIP CLOSURE SKIN 1/2X4 (GAUZE/BANDAGES/DRESSINGS) IMPLANT
SUT PROLENE 6 0 BV (SUTURE) IMPLANT
SUT VIC AB 1 CT1 18XBRD ANBCTR (SUTURE) ×1 IMPLANT
SUT VIC AB 1 CT1 8-18 (SUTURE) ×2
SUT VIC AB 2-0 CP2 18 (SUTURE) ×3 IMPLANT
SUT VIC AB 3-0 SH 8-18 (SUTURE) ×1 IMPLANT
SYR 20ML ECCENTRIC (SYRINGE) ×2 IMPLANT
SYR 5ML LL (SYRINGE) ×1 IMPLANT
TAPE CLOTH SURG 4X10 WHT LF (GAUZE/BANDAGES/DRESSINGS) ×1 IMPLANT
TOWEL OR 17X24 6PK STRL BLUE (TOWEL DISPOSABLE) ×2 IMPLANT
TOWEL OR 17X26 10 PK STRL BLUE (TOWEL DISPOSABLE) ×2 IMPLANT
TUBE CONNECTING 12X1/4 (SUCTIONS) ×1 IMPLANT
WATER STERILE IRR 1000ML POUR (IV SOLUTION) ×2 IMPLANT

## 2014-06-14 NOTE — Anesthesia Preprocedure Evaluation (Addendum)
Anesthesia Evaluation  Patient identified by MRN, date of birth, ID band Patient awake    Reviewed: Allergy & Precautions, NPO status , Patient's Chart, lab work & pertinent test results  Airway Mallampati: II  TM Distance: >3 FB Neck ROM: Full    Dental  (+) Edentulous Upper, Edentulous Lower, Dental Advisory Given   Pulmonary former smoker,  breath sounds clear to auscultation        Cardiovascular hypertension, + angina + CAD and + Cardiac Stents Rhythm:Regular Rate:Normal     Neuro/Psych  Headaches, Anxiety Depression    GI/Hepatic Neg liver ROS, GERD-  ,  Endo/Other  diabetes, Type 2, Oral Hypoglycemic AgentsHypothyroidism Morbid obesity  Renal/GU negative Renal ROS     Musculoskeletal  (+) Arthritis -,   Abdominal   Peds  Hematology negative hematology ROS (+)   Anesthesia Other Findings   Reproductive/Obstetrics                           Anesthesia Physical Anesthesia Plan  ASA: III  Anesthesia Plan: General   Post-op Pain Management:    Induction: Intravenous  Airway Management Planned: Oral ETT  Additional Equipment:   Intra-op Plan:   Post-operative Plan: Extubation in OR  Informed Consent: I have reviewed the patients History and Physical, chart, labs and discussed the procedure including the risks, benefits and alternatives for the proposed anesthesia with the patient or authorized representative who has indicated his/her understanding and acceptance.   Dental advisory given  Plan Discussed with: CRNA  Anesthesia Plan Comments:         Anesthesia Quick Evaluation

## 2014-06-14 NOTE — Op Note (Signed)
06/14/2014  2:19 PM  PATIENT:  [name]  69 y.o. male  PRE-OPERATIVE DIAGNOSIS:  Left L3-4 lumbar herniated disc, lumbar degenerative disease, lumbar spondylosis, lumbar radiculopathy  POST-OPERATIVE DIAGNOSIS:  Left L3-4 Lumbar herniated disc, lumbar degenerative disease, lumbar spondylosis, lumbar radiculopathy  PROCEDURE:  Procedure(s):  Left L3 hemilaminotomy and microdiscectomy, with micro-section, microsurgical technique, and the operating microscope  SURGEON:  Surgeon(s): Hosie Spangle, MD Elaina Hoops, MD  ASSISTANTS: Kary Kos, M.D.  ANESTHESIA:   general  EBL:  Total I/O In: 1200 [I.V.:1200] Out: 100 [Blood:100]  BLOOD ADMINISTERED:none  COUNT: Correct per nursing staff  DICTATION: Patient was brought to the operating room and placed under general endotracheal anesthesia. Patient was turned to prone position the lumbar region was prepped with Betadine soap and solution and draped in a sterile fashion. The midline was infiltrated with local anesthetic with epinephrine. A localizing x-ray was taken and the L3-4 level was identified. Midline incision was made over the L3-4 level and was carried down through the subcutaneous tissue to the lumbar fascia. The lumbar fascia was incised on the left side and the paraspinal muscles were dissected from the spinous processes and lamina in a subperiosteal fashion. Another x-ray was taken and the L3-4 intralaminar space was identified. The operating microscope was draped and brought into the field provided additional magnification, illumination, and visualization. A left L3 hemi-laminotomy was performed using the high-speed drill and Kerrison punches. The ligamentum flavum was carefully resected. The underlying thecal sac and nerve root were identified. The disc herniation was identified, rostral to the L3-4 disc space, behind the left side of the body of L3, new the axilla of the left L3 nerve root, as expected from the MRI findings.   Were able to mobilize the free fragment, and was removed in a piecemeal fashion, with 2 large fragments and a few smaller fragments. The epidural space was thoroughly explored and no further fragments were found, and was felt that good decompression of the thecal sac and nerve roots was achieved. Once the discectomy was completed and good decompression of the thecal sac and nerve had been achieved hemostasis was established with the use of bipolar cautery and Gelfoam with thrombin. The Gelfoam was removed and hemostasis confirmed. We then instilled 2 cc of fentanyl into the epidural space. Deep fascia was closed with interrupted undyed 1 Vicryl sutures. Scarpa's fascia was closed with interrupted undyed 1 Vicryl sutures in the subcutaneous and subcuticular layer were closed with interrupted inverted 2-0 undyed Vicryl sutures. The skin edges were approximated with Dermabond. Following surgery the patient was turned back to a supine position to be reversed from the anesthetic extubated and transferred to the recovery room for further care.   PLAN OF CARE: Admit for overnight observation  PATIENT DISPOSITION:  PACU - hemodynamically stable.   Delay start of Pharmacological VTE agent (>24hrs) due to surgical blood loss or risk of bleeding:  yes

## 2014-06-14 NOTE — H&P (Signed)
Subjective: Patient is a 69 y.o. male who is admitted for treatment of an acute left lumbar radiculopathy secondary to a large left L3-4 lumbar disc condition, with a fragment has migrated rostrally behind the body of the left side of L3, with significant thecal sac and nerve root compression. Examination shows significant weakness of the left iliopsoas 2-3/5, left quadriceps 4+/5, left EHL 3/5. Patient is status post a previous L4-5 and L5-S1 lumbar decompression and PLIF, and L4-S1 posterior lateral arthrodesis in July 2010.  He is admitted now for a left L3 hemi-laminotomy and micro-discectomy.   Patient Active Problem List   Diagnosis Date Noted  . Atypical chest pain 12/28/2013  . Unstable angina 12/27/2013  . Atherosclerotic heart disease of native coronary artery with angina pectoris 03/14/2013    Class: Chronic  . Hyperlipidemia 03/14/2013  . Chest pain 07/30/2011  . Obesity, unspecified 07/30/2011  . Essential hypertension, benign 07/30/2011  . Diabetes mellitus type 2, controlled, with complications 48/54/6270   Past Medical History  Diagnosis Date  . DJD (degenerative joint disease)   . Arthritis   . Gout     yrs ago and doesn't take any meds  . Tuberculosis   . Anxiety   . Hypertension     takes Amlodipine,Losartan,and Coreg daily  . Hypothyroidism     takes Synthroid daily  . Type II diabetes mellitus     takes Metformin daily  . Depression     takes Prozac daily  . Coronary artery disease     takes Plavix and ASA daily  . Hyperlipidemia     takes Atorvastatin daily  . Shortness of breath dyspnea     with exertion  . Headache(784.0)     occasionally  . Dizziness     occasionally and states Dr.Smith is aware  . Weakness     numbness and tingling in left leg  . Chronic back pain     herniated disc  . GERD (gastroesophageal reflux disease)     takes an OTC antacid  . IBS (irritable bowel syndrome)   . History of colon polyps     benign  . Diverticulitis      hx of  . History of kidney stones   . Hard of hearing     wears hearing aids    Past Surgical History  Procedure Laterality Date  . Cholecystectomy  1970's  . Shoulder arthroscopy w/ rotator cuff repair  ~ 2011    left  . Anterior cervical decomp/discectomy fusion  04/2007    C4-5; C6-7  . Back surgery      "3 times; last time was screws in lower back 10/2008"  . Lithotripsy    . Pci  5/09/2-13    LAD  . Left heart catheterization with coronary angiogram N/A 08/21/2011    Procedure: LEFT HEART CATHETERIZATION WITH CORONARY ANGIOGRAM;  Surgeon: Sinclair Grooms, MD;  Location: [facility] CATH LAB;  Service: Cardiovascular;  Laterality: N/A;  . Coronary angioplasty  1999/2013    1 stent  . Cystoscopy    . Colonoscopy      Prescriptions prior to admission  Medication Sig Dispense Refill Last Dose  . amLODipine (NORVASC) 10 MG tablet Take 10 mg by mouth daily.   06/14/2014 at Unknown time  . aspirin 81 MG tablet Take 81 mg by mouth daily.   1 month at Unknown time  . atorvastatin (LIPITOR) 20 MG tablet Take 20 mg by mouth daily.   06/13/2014 at Unknown  time  . carvedilol (COREG) 25 MG tablet Take 25 mg by mouth 2 (two) times daily with a meal.   06/14/2014 at Unknown time  . clopidogrel (PLAVIX) 75 MG tablet Take 1 tablet (75 mg total) by mouth daily with breakfast. 30 tablet 11 1 month at Unknown time  . FLUoxetine (PROZAC) 40 MG capsule Take 40 mg by mouth daily.   06/13/2014 at Unknown time  . gabapentin (NEURONTIN) 300 MG capsule Take 300 mg by mouth 3 (three) times daily.   06/13/2014 at Unknown time  . hydrochlorothiazide (HYDRODIURIL) 12.5 MG tablet Take 12.5 mg by mouth daily.   06/13/2014 at Unknown time  . HYDROmorphone (DILAUDID) 4 MG tablet Take by mouth every 4 (four) hours as needed for severe pain.   Past Week at Unknown time  . levothyroxine (SYNTHROID, LEVOTHROID) 25 MCG tablet Take 25 mcg by mouth daily.   06/13/2014 at Unknown time  . losartan (COZAAR) 100 MG tablet Take 100 mg by mouth  daily.   06/14/2014 at Unknown time  . metFORMIN (GLUCOPHAGE-XR) 500 MG 24 hr tablet Take 500 mg by mouth 2 (two) times daily.    06/13/2014 at Unknown time  . nitroGLYCERIN (NITROSTAT) 0.4 MG SL tablet Place 0.4 mg under the tongue every 5 (five) minutes as needed. For chest pain   unknown  . carvedilol (COREG) 25 MG tablet Take 1 tablet (25 mg total) by mouth 2 (two) times daily with a meal. 60 tablet 11 Taking  . hydrochlorothiazide (MICROZIDE) 12.5 MG capsule Take 1 capsule (12.5 mg total) by mouth daily. 30 capsule 11 Taking   Allergies  Allergen Reactions  . Sulfa Antibiotics Rash    History  Substance Use Topics  . Smoking status: Former Research scientist (life sciences)  . Smokeless tobacco: Current User    Types: Chew  . Alcohol Use: No    Family History  Problem Relation Age of Onset  . Cancer Father      Review of Systems A comprehensive review of systems was negative.  Objective: Vital signs in last 24 hours: Temp:  [97.9 F (36.6 C)] 97.9 F (36.6 C) (03/02 0853) Pulse Rate:  [63] 63 (03/02 0853) Resp:  [20] 20 (03/02 0853) BP: (178)/(68) 178/68 mmHg (03/02 0853) SpO2:  [100 %] 100 % (03/02 0853) Weight:  [119.296 kg (263 lb)] 119.296 kg (263 lb) (03/02 0853)  EXAM: Patient is a well-developed well-nourished white male in discomfort, but no acute distress. Lungs are clear to auscultation , the patient has symmetrical respiratory excursion. Heart has a regular rate and rhythm normal S1 and S2 no murmur.   Abdomen is soft nontender nondistended bowel sounds are present. Extremity examination shows no clubbing cyanosis or edema. Neurologic examination shows weakness in the left lower extremity. The left iliopsoas is 2-3, the left quadriceps is 4+, the left dorsiflexor and plantar flexor are 5, and the left EHL is 3. The right lower extremity strength is 5/5 throughout. Sensation is intact to pinprick in the distal lower Charlie's. Reflexes show the left quadriceps is 1-2, the right quadriceps is 2.  Gastrocnemius are absent bilaterally. Toes are downgoing body. He can stance both favor the left lower extremity, and he does use a rolling walker to provide him with support.  Data Review:CBC    Component Value Date/Time   WBC 6.8 06/07/2014 0848   RBC 4.87 06/07/2014 0848   HGB 14.5 06/07/2014 0848   HCT 42.1 06/07/2014 0848   PLT 180 06/07/2014 0848   MCV 86.4  06/07/2014 0848   MCH 29.8 06/07/2014 0848   MCHC 34.4 06/07/2014 0848   RDW 13.2 06/07/2014 0848   LYMPHSABS 1.6 12/27/2013 1820   MONOABS 0.5 12/27/2013 1820   EOSABS 0.1 12/27/2013 1820   BASOSABS 0.0 12/27/2013 1820                          BMET    Component Value Date/Time   NA 138 06/07/2014 0848   K 4.8 06/07/2014 0848   CL 101 06/07/2014 0848   CO2 29 06/07/2014 0848   GLUCOSE 226* 06/07/2014 0848   BUN 13 06/07/2014 0848   CREATININE 0.75 06/07/2014 0848   CALCIUM 9.8 06/07/2014 0848   GFRNONAA >90 06/07/2014 0848   GFRAA >90 06/07/2014 0848     Assessment/Plan: Patient presented with acute left lumbar radicular the with weakness in the left lower extremity, who has a large left L3-4 lumbar disc patient with a fragment has migrated rostrally behind the body of L3 on the left side. He's had previous surgery at L4-5 and L5-S1 with most recently a fusion at both levels. He is admitted now for left L3 hemilaminotomy and microdiscectomy.I've discussed with the patient the nature of his condition, the nature the surgical procedure, the typical length of surgery, hospital stay, and overall recuperation. We discussed limitations postoperatively. I discussed risks of surgery including risks of infection, bleeding, possibly need for transfusion, the risk of nerve root dysfunction with pain, weakness, numbness, or paresthesias, or risk of dural tear and CSF leakage and possible need for further surgery, the risk of recurrent disc herniation and the possible need for further surgery, and the risk of anesthetic complications  including myocardial infarction, stroke, pneumonia, and death. Understanding all this the patient does wish to proceed with surgery and is admitted for such.    Hosie Spangle, MD 06/14/2014 11:43 AM

## 2014-06-14 NOTE — Plan of Care (Signed)
Problem: Consults Goal: Diagnosis - Spinal Surgery Outcome: Completed/Met Date Met:  06/14/14 Lumbar Laminectomy (Complex)

## 2014-06-14 NOTE — Anesthesia Postprocedure Evaluation (Signed)
  Anesthesia Post-op Note  Patient: [name]  Procedure(s) Performed: Procedure(s) with comments: LEFT LUMBAR THREE-FOUR LANINOTOMY AND MICRODISKECTOMY. (Left) - left  Patient Location: PACU  Anesthesia Type:General  Level of Consciousness: awake and alert   Airway and Oxygen Therapy: Patient Spontanous Breathing  Post-op Pain: moderate  Post-op Assessment: Post-op Vital signs reviewed, Patient's Cardiovascular Status Stable and Respiratory Function Stable  Post-op Vital Signs: Reviewed  Filed Vitals:   06/14/14 1600  BP: 127/65  Pulse:   Temp:   Resp:     Complications: No apparent anesthesia complications

## 2014-06-14 NOTE — Progress Notes (Signed)
Filed Vitals:   06/14/14 1545 06/14/14 1600 06/14/14 1615 06/14/14 1632  BP: 126/65 127/65  118/37  Pulse: 55 55 53 54  Temp:   98.2 F (36.8 C) 97.7 F (36.5 C)  TempSrc:      Resp: 16 17 15 18   Weight:      SpO2: 96% 97% 97% 100%    Patient resting in bed, fairly comfortable. Mild residual left lumbar radicular discomfort. Ambulated from stretcher to his bed, but has not been up and ambulating yet in the halls. Patient has voided. Dressing clean and dry.  Plan: Continue to progress through postoperative recovery.  Hosie Spangle, MD 06/14/2014, 6:05 PM

## 2014-06-14 NOTE — Anesthesia Procedure Notes (Addendum)
Procedure Name: Intubation Date/Time: 06/14/2014 12:09 PM Performed by: AUSTON, AMANDA M Pre-anesthesia Checklist: Patient identified, Timeout performed, Emergency Drugs available, Suction available and Patient being monitored Patient Re-evaluated:Patient Re-evaluated prior to inductionOxygen Delivery Method: Circle system utilized Preoxygenation: Pre-oxygenation with 100% oxygen Intubation Type: IV induction and Rapid sequence Ventilation: Mask ventilation with difficulty, Oral airway inserted - appropriate to patient size and Two handed mask ventilation required Laryngoscope Size: Miller and 2 Grade View: Grade I Tube type: Oral Tube size: 7.5 mm Number of attempts: 1 Airway Equipment and Method: Stylet and LTA kit utilized Placement Confirmation: ETT inserted through vocal cords under direct vision,  breath sounds checked- equal and bilateral and positive ETCO2 Secured at: 22 cm Tube secured with: Tape Dental Injury: Teeth and Oropharynx as per pre-operative assessment  Comments: Small skin tear on patient's nose from mask     

## 2014-06-14 NOTE — Transfer of Care (Signed)
Immediate Anesthesia Transfer of Care Note  Patient: Agapito Games Kamen  Procedure(s) Performed: Procedure(s) with comments: LEFT LUMBAR THREE-FOUR LANINOTOMY AND MICRODISKECTOMY. (Left) - left  Patient Location: PACU  Anesthesia Type:General  Level of Consciousness: awake and alert   Airway & Oxygen Therapy: nasal cannula  Post-op Assessment: Report given to RN and Post -op Vital signs reviewed and stable  Post vital signs: Reviewed and stable  Last Vitals:  Filed Vitals:   06/14/14 0853  BP: 178/68  Pulse: 63  Temp: 36.6 C  Resp: 20    Complications: No apparent anesthesia complications

## 2014-06-15 ENCOUNTER — Encounter (HOSPITAL_COMMUNITY): Payer: Self-pay | Admitting: Neurosurgery

## 2014-06-15 DIAGNOSIS — F419 Anxiety disorder, unspecified: Secondary | ICD-10-CM | POA: Diagnosis not present

## 2014-06-15 DIAGNOSIS — E785 Hyperlipidemia, unspecified: Secondary | ICD-10-CM | POA: Diagnosis not present

## 2014-06-15 DIAGNOSIS — I10 Essential (primary) hypertension: Secondary | ICD-10-CM | POA: Diagnosis not present

## 2014-06-15 DIAGNOSIS — I25110 Atherosclerotic heart disease of native coronary artery with unstable angina pectoris: Secondary | ICD-10-CM | POA: Diagnosis not present

## 2014-06-15 DIAGNOSIS — E119 Type 2 diabetes mellitus without complications: Secondary | ICD-10-CM | POA: Diagnosis not present

## 2014-06-15 DIAGNOSIS — M5116 Intervertebral disc disorders with radiculopathy, lumbar region: Secondary | ICD-10-CM | POA: Diagnosis not present

## 2014-06-15 LAB — GLUCOSE, CAPILLARY
Glucose-Capillary: 152 mg/dL — ABNORMAL HIGH (ref 70–99)
Glucose-Capillary: 127 mg/dL — ABNORMAL HIGH (ref 70–99)

## 2014-06-15 MED ORDER — HYDROCODONE-ACETAMINOPHEN 5-325 MG PO TABS: 1.0000 | ORAL_TABLET | ORAL | Status: DC | PRN

## 2014-06-15 NOTE — Discharge Summary (Signed)
Physician Discharge Summary  Patient ID: MEDARD DECUIR MRN: [id number] DOB/AGE: 12-30-1945 69 y.o.  Admit date: 06/14/2014 Discharge date: 06/15/2014  Admission Diagnoses:  Left L3-4 lumbar herniated disc, lumbar degenerative disease, lumbar spondylosis, lumbar radiculopathy  Discharge Diagnoses:  Left L3-4 lumbar herniated disc, lumbar degenerative disease, lumbar spondylosis, lumbar radiculopathy Active Problems:   HNP (herniated nucleus pulposus), lumbar   Discharged Condition: good  Hospital Course: Patient admitted, underwent a left L3 hemilaminotomy and microdiscectomy. He is done well following surgery he is up and ambulate actively. His incision is healing nicely. It is clean, dry, and intact. He is asking to be discharged to home. He has been given instructions regarding wound care and activities following discharge. He is scheduled follow-up with me in 3 weeks.  Discharge Exam: Blood pressure 136/53, pulse 63, temperature 98.6 F (37 C), temperature source Oral, resp. rate 18, weight 119.296 kg (263 lb), SpO2 100 %.  Disposition: Home     Medication List    TAKE these medications        amLODipine 10 MG tablet  Commonly known as:  NORVASC  Take 10 mg by mouth daily.     aspirin 81 MG tablet  Take 81 mg by mouth daily.     atorvastatin 20 MG tablet  Commonly known as:  LIPITOR  Take 20 mg by mouth daily.     carvedilol 25 MG tablet  Commonly known as:  COREG  Take 25 mg by mouth 2 (two) times daily with a meal.     carvedilol 25 MG tablet  Commonly known as:  COREG  Take 1 tablet (25 mg total) by mouth 2 (two) times daily with a meal.     clopidogrel 75 MG tablet  Commonly known as:  PLAVIX  Take 1 tablet (75 mg total) by mouth daily with breakfast.     FLUoxetine 40 MG capsule  Commonly known as:  PROZAC  Take 40 mg by mouth daily.     gabapentin 300 MG capsule  Commonly known as:  NEURONTIN  Take 300 mg by mouth 3 (three) times daily.     hydrochlorothiazide 12.5 MG tablet  Commonly known as:  HYDRODIURIL  Take 12.5 mg by mouth daily.     hydrochlorothiazide 12.5 MG capsule  Commonly known as:  MICROZIDE  Take 1 capsule (12.5 mg total) by mouth daily.     HYDROcodone-acetaminophen 5-325 MG per tablet  Commonly known as:  NORCO/VICODIN  Take 1-2 tablets by mouth every 4 (four) hours as needed (pain).     HYDROmorphone 4 MG tablet  Commonly known as:  DILAUDID  Take by mouth every 4 (four) hours as needed for severe pain.     levothyroxine 25 MCG tablet  Commonly known as:  SYNTHROID, LEVOTHROID  Take 25 mcg by mouth daily.     losartan 100 MG tablet  Commonly known as:  COZAAR  Take 100 mg by mouth daily.     metFORMIN 500 MG 24 hr tablet  Commonly known as:  GLUCOPHAGE-XR  Take 500 mg by mouth 2 (two) times daily.     nitroGLYCERIN 0.4 MG SL tablet  Commonly known as:  NITROSTAT  Place 0.4 mg under the tongue every 5 (five) minutes as needed. For chest pain         Signed: Hosie Spangle, MD 06/15/2014, 1:52 PM

## 2014-06-15 NOTE — Discharge Instructions (Signed)

## 2014-06-15 NOTE — Progress Notes (Signed)
Pt is doing well. Pt given D/C instructions with Rx, verbal understanding was provided. Pt's incision is open to air, it is clean and dry with no sign of infection. Pt's IV was removed prior to D/C. Pt D/C'd home via wheelchair @ 1445 per MD order. Pt is stable @ D/C and has no other needs at this time. Holli Humbles, RN

## 2014-06-20 DIAGNOSIS — I739 Peripheral vascular disease, unspecified: Secondary | ICD-10-CM | POA: Insufficient documentation

## 2014-06-20 DIAGNOSIS — M7989 Other specified soft tissue disorders: Secondary | ICD-10-CM

## 2014-06-20 HISTORY — DX: Other specified soft tissue disorders: M79.89

## 2014-06-21 ENCOUNTER — Encounter (HOSPITAL_COMMUNITY): Payer: No Typology Code available for payment source

## 2014-06-22 ENCOUNTER — Other Ambulatory Visit (HOSPITAL_COMMUNITY): Payer: Self-pay | Admitting: Neurosurgery

## 2014-06-22 ENCOUNTER — Ambulatory Visit (HOSPITAL_COMMUNITY)
Admission: RE | Admit: 2014-06-22 | Discharge: 2014-06-22 | Disposition: A | Discharge status: Home / Self Care | Payer: Medicare Other | Source: Ambulatory Visit | Attending: Vascular Surgery | Admitting: Vascular Surgery

## 2014-06-22 DIAGNOSIS — I739 Peripheral vascular disease, unspecified: Secondary | ICD-10-CM

## 2014-06-22 DIAGNOSIS — R600 Localized edema: Secondary | ICD-10-CM

## 2014-06-28 DIAGNOSIS — Z23 Encounter for immunization: Secondary | ICD-10-CM | POA: Diagnosis not present

## 2014-06-28 DIAGNOSIS — S61218A Laceration without foreign body of other finger without damage to nail, initial encounter: Secondary | ICD-10-CM | POA: Diagnosis not present

## 2014-08-14 DIAGNOSIS — M7551 Bursitis of right shoulder: Secondary | ICD-10-CM | POA: Diagnosis not present

## 2014-08-14 DIAGNOSIS — Z6838 Body mass index (BMI) 38.0-38.9, adult: Secondary | ICD-10-CM | POA: Diagnosis not present

## 2014-08-14 DIAGNOSIS — R1032 Left lower quadrant pain: Secondary | ICD-10-CM | POA: Diagnosis not present

## 2014-08-14 DIAGNOSIS — L281 Prurigo nodularis: Secondary | ICD-10-CM | POA: Diagnosis not present

## 2014-08-14 DIAGNOSIS — E1165 Type 2 diabetes mellitus with hyperglycemia: Secondary | ICD-10-CM | POA: Diagnosis not present

## 2014-08-14 DIAGNOSIS — K5909 Other constipation: Secondary | ICD-10-CM | POA: Diagnosis not present

## 2014-08-28 DIAGNOSIS — Z6837 Body mass index (BMI) 37.0-37.9, adult: Secondary | ICD-10-CM | POA: Diagnosis not present

## 2014-08-28 DIAGNOSIS — M7551 Bursitis of right shoulder: Secondary | ICD-10-CM | POA: Diagnosis not present

## 2014-08-28 DIAGNOSIS — E119 Type 2 diabetes mellitus without complications: Secondary | ICD-10-CM | POA: Diagnosis not present

## 2014-08-28 DIAGNOSIS — R1032 Left lower quadrant pain: Secondary | ICD-10-CM | POA: Diagnosis not present

## 2014-10-20 DIAGNOSIS — Z6836 Body mass index (BMI) 36.0-36.9, adult: Secondary | ICD-10-CM | POA: Diagnosis not present

## 2014-10-20 DIAGNOSIS — Z9889 Other specified postprocedural states: Secondary | ICD-10-CM | POA: Diagnosis not present

## 2014-10-20 DIAGNOSIS — M5416 Radiculopathy, lumbar region: Secondary | ICD-10-CM | POA: Diagnosis not present

## 2014-10-20 DIAGNOSIS — M4726 Other spondylosis with radiculopathy, lumbar region: Secondary | ICD-10-CM | POA: Diagnosis not present

## 2014-10-20 DIAGNOSIS — M5136 Other intervertebral disc degeneration, lumbar region: Secondary | ICD-10-CM | POA: Diagnosis not present

## 2014-11-21 DIAGNOSIS — R1032 Left lower quadrant pain: Secondary | ICD-10-CM | POA: Diagnosis not present

## 2014-11-21 DIAGNOSIS — Z86010 Personal history of colonic polyps: Secondary | ICD-10-CM | POA: Diagnosis not present

## 2014-11-23 DIAGNOSIS — N200 Calculus of kidney: Secondary | ICD-10-CM | POA: Diagnosis not present

## 2014-11-23 DIAGNOSIS — R1032 Left lower quadrant pain: Secondary | ICD-10-CM | POA: Diagnosis not present

## 2014-11-23 DIAGNOSIS — Z9049 Acquired absence of other specified parts of digestive tract: Secondary | ICD-10-CM | POA: Diagnosis not present

## 2014-11-23 DIAGNOSIS — N400 Enlarged prostate without lower urinary tract symptoms: Secondary | ICD-10-CM | POA: Diagnosis not present

## 2014-12-04 DIAGNOSIS — Z23 Encounter for immunization: Secondary | ICD-10-CM | POA: Diagnosis not present

## 2014-12-15 DIAGNOSIS — D126 Benign neoplasm of colon, unspecified: Secondary | ICD-10-CM | POA: Diagnosis not present

## 2014-12-15 DIAGNOSIS — Z86010 Personal history of colonic polyps: Secondary | ICD-10-CM | POA: Diagnosis not present

## 2014-12-15 DIAGNOSIS — Z800 Family history of malignant neoplasm of digestive organs: Secondary | ICD-10-CM | POA: Diagnosis not present

## 2014-12-15 DIAGNOSIS — K5730 Diverticulosis of large intestine without perforation or abscess without bleeding: Secondary | ICD-10-CM | POA: Diagnosis not present

## 2014-12-15 DIAGNOSIS — R1030 Lower abdominal pain, unspecified: Secondary | ICD-10-CM | POA: Diagnosis not present

## 2014-12-28 DIAGNOSIS — N281 Cyst of kidney, acquired: Secondary | ICD-10-CM | POA: Diagnosis not present

## 2014-12-28 DIAGNOSIS — N200 Calculus of kidney: Secondary | ICD-10-CM | POA: Diagnosis not present

## 2014-12-28 DIAGNOSIS — R1010 Upper abdominal pain, unspecified: Secondary | ICD-10-CM | POA: Diagnosis not present

## 2015-01-11 DIAGNOSIS — H35373 Puckering of macula, bilateral: Secondary | ICD-10-CM | POA: Diagnosis not present

## 2015-01-11 DIAGNOSIS — H524 Presbyopia: Secondary | ICD-10-CM | POA: Diagnosis not present

## 2015-01-11 DIAGNOSIS — H5202 Hypermetropia, left eye: Secondary | ICD-10-CM | POA: Diagnosis not present

## 2015-01-11 DIAGNOSIS — I10 Essential (primary) hypertension: Secondary | ICD-10-CM | POA: Diagnosis not present

## 2015-01-11 DIAGNOSIS — E119 Type 2 diabetes mellitus without complications: Secondary | ICD-10-CM | POA: Diagnosis not present

## 2015-01-11 DIAGNOSIS — H5211 Myopia, right eye: Secondary | ICD-10-CM | POA: Diagnosis not present

## 2015-01-11 DIAGNOSIS — H52223 Regular astigmatism, bilateral: Secondary | ICD-10-CM | POA: Diagnosis not present

## 2015-01-11 DIAGNOSIS — H43313 Vitreous membranes and strands, bilateral: Secondary | ICD-10-CM | POA: Diagnosis not present

## 2015-01-11 DIAGNOSIS — H43393 Other vitreous opacities, bilateral: Secondary | ICD-10-CM | POA: Diagnosis not present

## 2015-01-11 DIAGNOSIS — H43813 Vitreous degeneration, bilateral: Secondary | ICD-10-CM | POA: Diagnosis not present

## 2015-01-11 DIAGNOSIS — H25093 Other age-related incipient cataract, bilateral: Secondary | ICD-10-CM | POA: Diagnosis not present

## 2015-02-20 DIAGNOSIS — Z6838 Body mass index (BMI) 38.0-38.9, adult: Secondary | ICD-10-CM | POA: Diagnosis not present

## 2015-02-20 DIAGNOSIS — M5440 Lumbago with sciatica, unspecified side: Secondary | ICD-10-CM | POA: Diagnosis not present

## 2015-02-20 DIAGNOSIS — M5136 Other intervertebral disc degeneration, lumbar region: Secondary | ICD-10-CM | POA: Diagnosis not present

## 2015-02-20 DIAGNOSIS — M4726 Other spondylosis with radiculopathy, lumbar region: Secondary | ICD-10-CM | POA: Diagnosis not present

## 2015-02-20 DIAGNOSIS — Z9889 Other specified postprocedural states: Secondary | ICD-10-CM | POA: Diagnosis not present

## 2015-02-20 DIAGNOSIS — M5416 Radiculopathy, lumbar region: Secondary | ICD-10-CM | POA: Diagnosis not present

## 2015-02-23 DIAGNOSIS — Z9181 History of falling: Secondary | ICD-10-CM | POA: Diagnosis not present

## 2015-02-23 DIAGNOSIS — M5416 Radiculopathy, lumbar region: Secondary | ICD-10-CM | POA: Diagnosis not present

## 2015-02-23 DIAGNOSIS — Z1389 Encounter for screening for other disorder: Secondary | ICD-10-CM | POA: Diagnosis not present

## 2015-02-23 DIAGNOSIS — R6889 Other general symptoms and signs: Secondary | ICD-10-CM | POA: Diagnosis not present

## 2015-02-23 DIAGNOSIS — E039 Hypothyroidism, unspecified: Secondary | ICD-10-CM | POA: Diagnosis not present

## 2015-02-23 DIAGNOSIS — E7800 Pure hypercholesterolemia, unspecified: Secondary | ICD-10-CM | POA: Diagnosis not present

## 2015-02-23 DIAGNOSIS — Z23 Encounter for immunization: Secondary | ICD-10-CM | POA: Diagnosis not present

## 2015-02-23 DIAGNOSIS — F418 Other specified anxiety disorders: Secondary | ICD-10-CM | POA: Diagnosis not present

## 2015-02-23 DIAGNOSIS — I10 Essential (primary) hypertension: Secondary | ICD-10-CM | POA: Diagnosis not present

## 2015-02-23 DIAGNOSIS — E119 Type 2 diabetes mellitus without complications: Secondary | ICD-10-CM | POA: Diagnosis not present

## 2015-02-23 DIAGNOSIS — R1032 Left lower quadrant pain: Secondary | ICD-10-CM | POA: Diagnosis not present

## 2015-02-23 DIAGNOSIS — I2510 Atherosclerotic heart disease of native coronary artery without angina pectoris: Secondary | ICD-10-CM | POA: Diagnosis not present

## 2015-03-13 ENCOUNTER — Other Ambulatory Visit: Payer: Self-pay

## 2015-03-16 ENCOUNTER — Ambulatory Visit: Payer: Medicare Other | Admitting: Interventional Cardiology

## 2015-03-16 ENCOUNTER — Encounter: Payer: Self-pay | Admitting: Interventional Cardiology

## 2015-03-16 ENCOUNTER — Ambulatory Visit (INDEPENDENT_AMBULATORY_CARE_PROVIDER_SITE_OTHER): Payer: Medicare Other | Admitting: Interventional Cardiology

## 2015-03-16 VITALS — BP 150/80 | HR 56 | Ht 69.0 in | Wt 269.4 lb

## 2015-03-16 DIAGNOSIS — I739 Peripheral vascular disease, unspecified: Secondary | ICD-10-CM | POA: Diagnosis not present

## 2015-03-16 DIAGNOSIS — E118 Type 2 diabetes mellitus with unspecified complications: Secondary | ICD-10-CM

## 2015-03-16 DIAGNOSIS — I10 Essential (primary) hypertension: Secondary | ICD-10-CM | POA: Diagnosis not present

## 2015-03-16 DIAGNOSIS — I25119 Atherosclerotic heart disease of native coronary artery with unspecified angina pectoris: Secondary | ICD-10-CM

## 2015-03-16 NOTE — Patient Instructions (Signed)
Medication Instructions:  Your physician recommends that you continue on your current medications as directed. Please refer to the Current Medication list given to you today.   Labwork: NONE ORDERED  Testing/Procedures: NONE ORDERED  Follow-Up: Your physician wants you to follow-up in: [place].  You will receive a reminder letter in the mail two months in advance. If you don't receive a letter, please call our office to schedule the follow-up appointment.   Any Other Special Instructions Will Be Listed Below (If Applicable).     If you need a refill on your cardiac medications before your next appointment, please call your pharmacy.

## 2015-03-16 NOTE — Progress Notes (Signed)
Cardiology Office Note   Date:  03/16/2015   ID:  [name], DOB Oct 17, 1945, MRN [id number]  PCP:  [name]  Cardiologist:  Joel Grooms, MD   Chief Complaint  Patient presents with  . Coronary Artery Disease      History of Present Illness: [name] is a 69 y.o. male who presents for nonobstructive coronary disease, hypertension, hyperlipidemia, diabetes mellitus, and obesity.   Atypical chest discomfort characterizes pinpoint with radiation into the axillary region on the left side of his body. Spontaneously occurring and resolving. No exertional component.  Dyspnea on exertion, stable over time. No palpitations or syncope. Denies peripheral edema.    Past Medical History  Diagnosis Date  . DJD (degenerative joint disease)   . Arthritis   . Gout     yrs ago and doesn't take any meds  . Tuberculosis   . Anxiety   . Hypertension     takes Amlodipine,Losartan,and Coreg daily  . Hypothyroidism     takes Synthroid daily  . Type II diabetes mellitus (HCC)     takes Metformin daily  . Depression     takes Prozac daily  . Coronary artery disease     takes Plavix and ASA daily  . Hyperlipidemia     takes Atorvastatin daily  . Shortness of breath dyspnea     with exertion  . Headache(784.0)     occasionally  . Dizziness     occasionally and states [name] is aware  . Weakness     numbness and tingling in left leg  . Chronic back pain     herniated disc  . GERD (gastroesophageal reflux disease)     takes an OTC antacid  . IBS (irritable bowel syndrome)   . History of colon polyps     benign  . Diverticulitis     hx of  . History of kidney stones   . Hard of hearing     wears hearing aids    Past Surgical History  Procedure Laterality Date  . Cholecystectomy  1970's  . Shoulder arthroscopy w/ rotator cuff repair  ~ 2011    left  . Anterior cervical decomp/discectomy fusion  04/2007    C4-5; C6-7  . Back surgery      "3  times; last time was screws in lower back 10/2008"  . Lithotripsy    . Pci  5/09/2-13    LAD  . Left heart catheterization with coronary angiogram N/A 08/21/2011    Procedure: LEFT HEART CATHETERIZATION WITH CORONARY ANGIOGRAM;  Surgeon: Joel Grooms, MD;  Location: [facility] CATH LAB;  Service: Cardiovascular;  Laterality: N/A;  . Coronary angioplasty  1999/2013    1 stent  . Cystoscopy    . Colonoscopy    . Lumbar laminectomy/decompression microdiscectomy Left 06/14/2014    Procedure: LEFT LUMBAR THREE-FOUR LANINOTOMY AND MICRODISKECTOMY.;  Surgeon: Hosie Spangle, MD;  Location: [facility];  Service: Neurosurgery;  Laterality: Left;  left     Current Outpatient Prescriptions  Medication Sig Dispense Refill  . amLODipine (NORVASC) 10 MG tablet Take 10 mg by mouth daily.    Marland Kitchen aspirin 81 MG tablet Take 81 mg by mouth daily.    Marland Kitchen atorvastatin (LIPITOR) 20 MG tablet Take 20 mg by mouth daily.    . carvedilol (COREG) 25 MG tablet Take 25 mg by mouth 2 (two) times daily with a meal.    . clindamycin (CLEOCIN) 300 MG capsule  Take 300 mg by mouth 3 (three) times daily.    . clopidogrel (PLAVIX) 75 MG tablet Take 75 mg by mouth daily with breakfast.    . FLUoxetine (PROZAC) 40 MG capsule Take 40 mg by mouth daily.    . hydrochlorothiazide (HYDRODIURIL) 12.5 MG tablet Take 12.5 mg by mouth daily.    Marland Kitchen HYDROcodone-acetaminophen (NORCO/VICODIN) 5-325 MG per tablet Take 1-2 tablets by mouth every 4 (four) hours as needed (pain). 70 tablet 0  . HYDROmorphone (DILAUDID) 4 MG tablet Take by mouth every 4 (four) hours as needed for severe pain.    Marland Kitchen levothyroxine (SYNTHROID, LEVOTHROID) 50 MCG tablet Take 50 mcg by mouth daily before breakfast.    . losartan (COZAAR) 100 MG tablet Take 100 mg by mouth daily.    . metFORMIN (GLUCOPHAGE-XR) 500 MG 24 hr tablet Take 500 mg by mouth 2 (two) times daily.     . nitroGLYCERIN (NITROSTAT) 0.4 MG SL tablet Place 0.4 mg under the tongue every 5 (five) minutes as  needed. For chest pain     No current facility-administered medications for this visit.    Allergies:   Sulfa antibiotics    Social History:  The patient  reports that he has quit smoking. His smokeless tobacco use includes Chew. He reports that he does not drink alcohol or use illicit drugs.   Family History:  The patient's family history includes Arthritis in his mother; Cancer in his father; Healthy in his sister and sister; Heart attack in his brother; Heart disease in his brother; Hypertension in his brother.    ROS:  Please see the history of present illness.   Otherwise, review of systems are positive for weak legs related to lumbar spine disease. Epigastric pressure related to irritable bowel syndrome. Has occasional palpitations that are momentary. There is also an epigastric fullness echo last up to 2-3 days and gradually gets better if he can release flatus..   All other systems are reviewed and negative.    PHYSICAL EXAM: VS:  BP 150/80 mmHg  Pulse 56  Ht 5\' 9"  (1.753 m)  Wt 269 lb 6.4 oz (122.199 kg)  BMI 39.77 kg/m2 , BMI Body mass index is 39.77 kg/(m^2). GEN: Well nourished, well developed, in no acute distressPer her morbid obesity HEENT: normal Neck: no JVD, carotid bruits, or masses Cardiac: RRR.  There is no murmur, rub, or gallop. There is no edema. Respiratory:  clear to auscultation bilaterally, normal work of breathing. GI: soft, nontender, nondistended, + BS MS: no deformity or atrophy Skin: warm and dry, no rash Neuro:  Strength and sensation are intact Psych: euthymic mood, full affect   EKG:  EKG is ordered today. The ekg reveals normal sinus rhythm with leftward axis. Nonspecific T-wave flattening.   Recent Labs: 06/07/2014: BUN 13; Creatinine, Ser 0.75; Hemoglobin 14.5; Platelets 180; Potassium 4.8; Sodium 138    Lipid Panel    Component Value Date/Time   CHOL 175 07/31/2011 0352   TRIG 161* 07/31/2011 0352   HDL 38* 07/31/2011 0352    CHOLHDL 4.6 07/31/2011 0352   VLDL 32 07/31/2011 0352   LDLCALC 105* 07/31/2011 0352      Wt Readings from Last 3 Encounters:  03/16/15 269 lb 6.4 oz (122.199 kg)  06/14/14 263 lb (119.296 kg)  06/07/14 263 lb 14.3 oz (119.7 kg)      Other studies Reviewed: Additional studies/ records that were reviewed today include: Electronic health record is reviewed.. The findings include recent laboratory data  by primary physician is reviewed..    ASSESSMENT AND PLAN:  1. Atherosclerosis of native coronary artery of native heart with angina pectoris ([place]) Asymptomatic  2. Essential hypertension, benign Recheck 130/78 mmHg.  3. 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 Stable over time  4. Controlled type 2 diabetes mellitus with complication, without long-term current use of insulin ([place]) Followed by primary care    Current medicines are reviewed at length with the patient today.  The patient has the following concerns regarding medicines: None.  The following changes/actions have been instituted:    No change with decision for clinical follow-up in one year  Labs/ tests ordered today include:  No orders of the defined types were placed in this encounter.     Disposition:   FU with HS in 1 year  Signed, Joel Grooms, MD  03/16/2015 11:21 AM    [address], [place], [place]  [address] Phone: [phone]; Fax: [phone]

## 2015-04-06 DIAGNOSIS — F418 Other specified anxiety disorders: Secondary | ICD-10-CM | POA: Diagnosis not present

## 2015-04-06 DIAGNOSIS — E039 Hypothyroidism, unspecified: Secondary | ICD-10-CM | POA: Diagnosis not present

## 2015-04-06 DIAGNOSIS — E119 Type 2 diabetes mellitus without complications: Secondary | ICD-10-CM | POA: Diagnosis not present

## 2015-04-06 DIAGNOSIS — R21 Rash and other nonspecific skin eruption: Secondary | ICD-10-CM | POA: Diagnosis not present

## 2015-04-06 DIAGNOSIS — L281 Prurigo nodularis: Secondary | ICD-10-CM | POA: Diagnosis not present

## 2015-04-19 DIAGNOSIS — J019 Acute sinusitis, unspecified: Secondary | ICD-10-CM | POA: Diagnosis not present

## 2015-04-19 DIAGNOSIS — J209 Acute bronchitis, unspecified: Secondary | ICD-10-CM | POA: Diagnosis not present

## 2015-07-05 DIAGNOSIS — I10 Essential (primary) hypertension: Secondary | ICD-10-CM | POA: Diagnosis not present

## 2015-07-05 DIAGNOSIS — S93401A Sprain of unspecified ligament of right ankle, initial encounter: Secondary | ICD-10-CM | POA: Diagnosis not present

## 2015-07-05 DIAGNOSIS — Z6839 Body mass index (BMI) 39.0-39.9, adult: Secondary | ICD-10-CM | POA: Diagnosis not present

## 2015-07-05 DIAGNOSIS — E669 Obesity, unspecified: Secondary | ICD-10-CM | POA: Diagnosis not present

## 2015-07-05 DIAGNOSIS — E119 Type 2 diabetes mellitus without complications: Secondary | ICD-10-CM | POA: Diagnosis not present

## 2015-07-05 DIAGNOSIS — I2510 Atherosclerotic heart disease of native coronary artery without angina pectoris: Secondary | ICD-10-CM | POA: Diagnosis not present

## 2015-07-05 DIAGNOSIS — R1032 Left lower quadrant pain: Secondary | ICD-10-CM | POA: Diagnosis not present

## 2015-07-05 DIAGNOSIS — F418 Other specified anxiety disorders: Secondary | ICD-10-CM | POA: Diagnosis not present

## 2015-07-05 DIAGNOSIS — M545 Low back pain: Secondary | ICD-10-CM | POA: Diagnosis not present

## 2015-07-12 DIAGNOSIS — R1032 Left lower quadrant pain: Secondary | ICD-10-CM | POA: Diagnosis not present

## 2015-07-12 DIAGNOSIS — Z6838 Body mass index (BMI) 38.0-38.9, adult: Secondary | ICD-10-CM | POA: Diagnosis not present

## 2015-07-12 DIAGNOSIS — M5416 Radiculopathy, lumbar region: Secondary | ICD-10-CM | POA: Diagnosis not present

## 2015-07-14 DIAGNOSIS — H25813 Combined forms of age-related cataract, bilateral: Secondary | ICD-10-CM | POA: Diagnosis not present

## 2015-07-14 DIAGNOSIS — I10 Essential (primary) hypertension: Secondary | ICD-10-CM | POA: Diagnosis not present

## 2015-07-19 DIAGNOSIS — R1032 Left lower quadrant pain: Secondary | ICD-10-CM | POA: Diagnosis not present

## 2015-07-19 DIAGNOSIS — M5416 Radiculopathy, lumbar region: Secondary | ICD-10-CM | POA: Diagnosis not present

## 2015-07-19 DIAGNOSIS — K5900 Constipation, unspecified: Secondary | ICD-10-CM | POA: Diagnosis not present

## 2015-08-14 DIAGNOSIS — R1032 Left lower quadrant pain: Secondary | ICD-10-CM | POA: Diagnosis not present

## 2015-08-14 DIAGNOSIS — Z6838 Body mass index (BMI) 38.0-38.9, adult: Secondary | ICD-10-CM | POA: Diagnosis not present

## 2015-08-21 DIAGNOSIS — M4726 Other spondylosis with radiculopathy, lumbar region: Secondary | ICD-10-CM | POA: Diagnosis not present

## 2015-08-21 DIAGNOSIS — Z6838 Body mass index (BMI) 38.0-38.9, adult: Secondary | ICD-10-CM | POA: Diagnosis not present

## 2015-08-21 DIAGNOSIS — M5416 Radiculopathy, lumbar region: Secondary | ICD-10-CM | POA: Diagnosis not present

## 2015-08-21 DIAGNOSIS — M5440 Lumbago with sciatica, unspecified side: Secondary | ICD-10-CM | POA: Diagnosis not present

## 2015-08-21 DIAGNOSIS — M5136 Other intervertebral disc degeneration, lumbar region: Secondary | ICD-10-CM | POA: Diagnosis not present

## 2015-08-22 ENCOUNTER — Encounter (HOSPITAL_COMMUNITY): Payer: Self-pay | Admitting: Emergency Medicine

## 2015-08-22 ENCOUNTER — Emergency Department (HOSPITAL_COMMUNITY): Payer: Medicare Other

## 2015-08-22 ENCOUNTER — Emergency Department (HOSPITAL_COMMUNITY)
Admission: EM | Admit: 2015-08-22 | Discharge: 2015-08-23 | Disposition: A | Discharge status: Home / Self Care | Payer: Medicare Other | Attending: Emergency Medicine | Admitting: Emergency Medicine

## 2015-08-22 DIAGNOSIS — N200 Calculus of kidney: Secondary | ICD-10-CM | POA: Diagnosis not present

## 2015-08-22 DIAGNOSIS — E039 Hypothyroidism, unspecified: Secondary | ICD-10-CM | POA: Insufficient documentation

## 2015-08-22 DIAGNOSIS — M1990 Unspecified osteoarthritis, unspecified site: Secondary | ICD-10-CM | POA: Diagnosis not present

## 2015-08-22 DIAGNOSIS — I10 Essential (primary) hypertension: Secondary | ICD-10-CM | POA: Insufficient documentation

## 2015-08-22 DIAGNOSIS — Z8611 Personal history of tuberculosis: Secondary | ICD-10-CM | POA: Insufficient documentation

## 2015-08-22 DIAGNOSIS — Z86010 Personal history of colonic polyps: Secondary | ICD-10-CM | POA: Diagnosis not present

## 2015-08-22 DIAGNOSIS — G8929 Other chronic pain: Secondary | ICD-10-CM | POA: Insufficient documentation

## 2015-08-22 DIAGNOSIS — E785 Hyperlipidemia, unspecified: Secondary | ICD-10-CM | POA: Diagnosis not present

## 2015-08-22 DIAGNOSIS — E119 Type 2 diabetes mellitus without complications: Secondary | ICD-10-CM | POA: Diagnosis not present

## 2015-08-22 DIAGNOSIS — Z7984 Long term (current) use of oral hypoglycemic drugs: Secondary | ICD-10-CM | POA: Insufficient documentation

## 2015-08-22 DIAGNOSIS — I2510 Atherosclerotic heart disease of native coronary artery without angina pectoris: Secondary | ICD-10-CM | POA: Diagnosis not present

## 2015-08-22 DIAGNOSIS — Z87442 Personal history of urinary calculi: Secondary | ICD-10-CM | POA: Diagnosis not present

## 2015-08-22 DIAGNOSIS — Z87891 Personal history of nicotine dependence: Secondary | ICD-10-CM | POA: Diagnosis not present

## 2015-08-22 DIAGNOSIS — F419 Anxiety disorder, unspecified: Secondary | ICD-10-CM | POA: Diagnosis not present

## 2015-08-22 DIAGNOSIS — R109 Unspecified abdominal pain: Secondary | ICD-10-CM | POA: Diagnosis present

## 2015-08-22 DIAGNOSIS — Z7902 Long term (current) use of antithrombotics/antiplatelets: Secondary | ICD-10-CM | POA: Diagnosis not present

## 2015-08-22 DIAGNOSIS — H9190 Unspecified hearing loss, unspecified ear: Secondary | ICD-10-CM | POA: Diagnosis not present

## 2015-08-22 DIAGNOSIS — E669 Obesity, unspecified: Secondary | ICD-10-CM | POA: Insufficient documentation

## 2015-08-22 DIAGNOSIS — Z79899 Other long term (current) drug therapy: Secondary | ICD-10-CM | POA: Diagnosis not present

## 2015-08-22 DIAGNOSIS — K219 Gastro-esophageal reflux disease without esophagitis: Secondary | ICD-10-CM | POA: Diagnosis not present

## 2015-08-22 DIAGNOSIS — Z792 Long term (current) use of antibiotics: Secondary | ICD-10-CM | POA: Diagnosis not present

## 2015-08-22 DIAGNOSIS — Z7982 Long term (current) use of aspirin: Secondary | ICD-10-CM | POA: Insufficient documentation

## 2015-08-22 LAB — CBC WITH DIFFERENTIAL/PLATELET
Basophils Absolute: 0 10*3/uL (ref 0.0–0.1)
Basophils Relative: 0 %
Eosinophils Absolute: 0.2 10*3/uL (ref 0.0–0.7)
Eosinophils Relative: 2 %
HEMATOCRIT: 41.7 % (ref 39.0–52.0)
HEMOGLOBIN: 14 g/dL (ref 13.0–17.0)
LYMPHS ABS: 1.4 10*3/uL (ref 0.7–4.0)
LYMPHS PCT: 15 %
MCH: 29.6 pg (ref 26.0–34.0)
MCHC: 33.6 g/dL (ref 30.0–36.0)
MCV: 88.2 fL (ref 78.0–100.0)
Monocytes Absolute: 0.7 10*3/uL (ref 0.1–1.0)
Monocytes Relative: 7 %
NEUTROS ABS: 7.1 10*3/uL (ref 1.7–7.7)
NEUTROS PCT: 76 %
Platelets: 224 10*3/uL (ref 150–400)
RBC: 4.73 MIL/uL (ref 4.22–5.81)
RDW: 13.3 % (ref 11.5–15.5)
WBC: 9.5 10*3/uL (ref 4.0–10.5)

## 2015-08-22 LAB — BASIC METABOLIC PANEL
Anion gap: 15 (ref 5–15)
BUN: 16 mg/dL (ref 6–20)
CHLORIDE: 101 mmol/L (ref 101–111)
CO2: 24 mmol/L (ref 22–32)
Calcium: 10.1 mg/dL (ref 8.9–10.3)
Creatinine, Ser: 1.07 mg/dL (ref 0.61–1.24)
GFR calc Af Amer: >60 mL/min (ref >60)
GFR calc non Af Amer: >60 mL/min (ref >60)
GLUCOSE: 184 mg/dL — AB (ref 65–99)
POTASSIUM: 4.2 mmol/L (ref 3.5–5.1)
SODIUM: 140 mmol/L (ref 135–145)

## 2015-08-22 LAB — URINALYSIS, ROUTINE W REFLEX MICROSCOPIC
Bilirubin Urine: NEGATIVE
Glucose, UA: NEGATIVE mg/dL
HGB URINE DIPSTICK: NEGATIVE
Ketones, ur: NEGATIVE mg/dL
Leukocytes, UA: NEGATIVE
Nitrite: NEGATIVE
Protein, ur: NEGATIVE mg/dL
SPECIFIC GRAVITY, URINE: 1.017 (ref 1.005–1.030)
pH: 5 (ref 5.0–8.0)

## 2015-08-22 MED ORDER — KETOROLAC TROMETHAMINE 30 MG/ML IJ SOLN
30.0000 mg | Freq: Once | INTRAMUSCULAR | Status: AC
  Administered 2015-08-22: 30 mg via INTRAVENOUS
  Filled 2015-08-22: qty 1

## 2015-08-22 MED ORDER — SODIUM CHLORIDE 0.9 % IV BOLUS (SEPSIS)
1000.0000 mL | Freq: Once | INTRAVENOUS | Status: AC
  Administered 2015-08-22: 1000 mL via INTRAVENOUS

## 2015-08-22 MED ORDER — MORPHINE SULFATE (PF) 4 MG/ML IV SOLN
6.0000 mg | Freq: Once | INTRAVENOUS | Status: AC
  Administered 2015-08-22: 6 mg via INTRAVENOUS
  Filled 2015-08-22: qty 2

## 2015-08-22 NOTE — ED Notes (Signed)
Pt. reports left lateral abdominal pain with nausea and headache onset yesterday morning , denies emesis or diarrhea . No fever or chills.

## 2015-08-22 NOTE — Discharge Instructions (Signed)
Flank Pain Mr. Mandella, Your CT scan does not show any cause for your pain.  Take ibuprofen for your back pain and continue your home medications.  See your primary care doctor within 3 days for close follow up.  If any symptoms worsen, come back to the ED immediately. Thank you. Flank pain is pain in your side. The flank is the area of your side between your upper belly (abdomen) and your back. Pain in this area can be caused by many different things. [place] care and treatment will depend on the cause of your pain.  Rest as told by your doctor.  Drink enough fluids to keep your pee (urine) clear or pale yellow.  Only take medicine as told by your doctor.  Tell your doctor about any changes in your pain.  Follow up with your doctor. GET HELP RIGHT AWAY IF:   Your pain does not get better with medicine.   You have new symptoms or your symptoms get worse.  Your pain gets worse.   You have belly (abdominal) pain.   You are short of breath.   You always feel sick to your stomach (nauseous).   You keep throwing up (vomiting).   You have puffiness (swelling) in your belly.   You feel light-headed or you pass out (faint).   You have blood in your pee.  You have a fever or lasting symptoms for more than 2-3 days.  You have a fever and your symptoms suddenly get worse. MAKE SURE YOU:   Understand these instructions.  Will watch your condition.  Will get help right away if you are not doing well or get worse.   This information is not intended to replace advice given to you by your health care provider. Make sure you discuss any questions you have with your health care provider.   Document Released: 01/08/2008 Document Revised: 04/21/2014 Document Reviewed: 11/13/2011 Elsevier Interactive Patient Education [employer].

## 2015-08-22 NOTE — ED Provider Notes (Signed)
CSN: [id number]     Arrival date & time 08/22/15  [id number] History  By signing my name below, I, Evelene Croon, attest that this documentation has been prepared under the direction and in the presence of Everlene Balls, MD . Electronically Signed: Evelene Croon, Scribe. 08/22/2015. 4:09 AM.    Chief Complaint  Patient presents with  . Abdominal Pain    The history is provided by the patient. No language interpreter was used.    HPI Comments:  [name] is a 70 y.o. male who presents to the Emergency Department complaining of moderate left lateral rib pain that radiates to left abdomen x 2 days. He reports h/o same; notes he has been diagnosed with IBS in the past. He denieis nausea, vomiting, hematuria, and dysuria. He states he has taken hydromorphone for pain with little relief.    Past Medical History  Diagnosis Date  . DJD (degenerative joint disease)   . Arthritis   . Gout     yrs ago and doesn't take any meds  . Tuberculosis   . Anxiety   . Hypertension     takes Amlodipine,Losartan,and Coreg daily  . Hypothyroidism     takes Synthroid daily  . Type II diabetes mellitus (HCC)     takes Metformin daily  . Depression     takes Prozac daily  . Coronary artery disease     takes Plavix and ASA daily  . Hyperlipidemia     takes Atorvastatin daily  . Shortness of breath dyspnea     with exertion  . Headache(784.0)     occasionally  . Dizziness     occasionally and states Dr.Smith is aware  . Weakness     numbness and tingling in left leg  . Chronic back pain     herniated disc  . GERD (gastroesophageal reflux disease)     takes an OTC antacid  . IBS (irritable bowel syndrome)   . History of colon polyps     benign  . Diverticulitis     hx of  . History of kidney stones   . Hard of hearing     wears hearing aids   Past Surgical History  Procedure Laterality Date  . Cholecystectomy  1970's  . Shoulder arthroscopy w/ rotator cuff repair  ~ 2011    left  .  Anterior cervical decomp/discectomy fusion  04/2007    C4-5; C6-7  . Back surgery      "3 times; last time was screws in lower back 10/2008"  . Lithotripsy    . Pci  5/09/2-13    LAD  . Left heart catheterization with coronary angiogram N/A 08/21/2011    Procedure: LEFT HEART CATHETERIZATION WITH CORONARY ANGIOGRAM;  Surgeon: Sinclair Grooms, MD;  Location: [facility] CATH LAB;  Service: Cardiovascular;  Laterality: N/A;  . Coronary angioplasty  1999/2013    1 stent  . Cystoscopy    . Colonoscopy    . Lumbar laminectomy/decompression microdiscectomy Left 06/14/2014    Procedure: LEFT LUMBAR THREE-FOUR LANINOTOMY AND MICRODISKECTOMY.;  Surgeon: Hosie Spangle, MD;  Location: [facility];  Service: Neurosurgery;  Laterality: Left;  left   Family History  Problem Relation Age of Onset  . Cancer Father     colon  . Arthritis Mother   . Healthy Sister   . Heart attack Brother   . Heart disease Brother   . Hypertension Brother   . Healthy Sister    Social History  Substance Use Topics  . Smoking status: Former Research scientist (life sciences)  . Smokeless tobacco: Current User    Types: Chew  . Alcohol Use: No    Review of Systems  10 systems reviewed and all are negative for acute change except as noted in the HPI.   Allergies  Sulfa antibiotics  Home Medications   Prior to Admission medications   Medication Sig Start Date End Date Taking? Authorizing Provider  amLODipine (NORVASC) 10 MG tablet Take 10 mg by mouth daily.    Historical Provider, MD  aspirin 81 MG tablet Take 81 mg by mouth daily.    Historical Provider, MD  atorvastatin (LIPITOR) 20 MG tablet Take 20 mg by mouth daily.    Historical Provider, MD  carvedilol (COREG) 25 MG tablet Take 25 mg by mouth 2 (two) times daily with a meal.    Historical Provider, MD  clindamycin (CLEOCIN) 300 MG capsule Take 300 mg by mouth 3 (three) times daily. 02/23/15   Historical Provider, MD  clopidogrel (PLAVIX) 75 MG tablet Take 75 mg by mouth daily with  breakfast.    Historical Provider, MD  FLUoxetine (PROZAC) 40 MG capsule Take 40 mg by mouth daily.    Historical Provider, MD  hydrochlorothiazide (HYDRODIURIL) 12.5 MG tablet Take 12.5 mg by mouth daily.    Historical Provider, MD  HYDROcodone-acetaminophen (NORCO/VICODIN) 5-325 MG per tablet Take 1-2 tablets by mouth every 4 (four) hours as needed (pain). 06/15/14   Jovita Gamma, MD  HYDROmorphone (DILAUDID) 4 MG tablet Take by mouth every 4 (four) hours as needed for severe pain.    Historical Provider, MD  levothyroxine (SYNTHROID, LEVOTHROID) 50 MCG tablet Take 50 mcg by mouth daily before breakfast. 02/26/15   Historical Provider, MD  losartan (COZAAR) 100 MG tablet Take 100 mg by mouth daily.    Historical Provider, MD  metFORMIN (GLUCOPHAGE-XR) 500 MG 24 hr tablet Take 500 mg by mouth 2 (two) times daily.     Historical Provider, MD  nitroGLYCERIN (NITROSTAT) 0.4 MG SL tablet Place 0.4 mg under the tongue every 5 (five) minutes as needed. For chest pain    Historical Provider, MD   BP 174/89 mmHg  Pulse 88  Temp(Src) 97.9 F (36.6 C) (Oral)  Resp 18  SpO2 100% Physical Exam  Constitutional: He is oriented to person, place, and time. Vital signs are normal. He appears well-developed.  Non-toxic appearance. He does not appear ill. No distress.  Obese male   HENT:  Head: Normocephalic and atraumatic.  Nose: Nose normal.  Mouth/Throat: Oropharynx is clear and moist. No oropharyngeal exudate.  Eyes: Conjunctivae and EOM are normal. Pupils are equal, round, and reactive to light. No scleral icterus.  Neck: Normal range of motion. Neck supple. No tracheal deviation, no edema, no erythema and normal range of motion present. No thyroid mass and no thyromegaly present.  Cardiovascular: Normal rate, regular rhythm, S1 normal, S2 normal, normal heart sounds, intact distal pulses and normal pulses.  Exam reveals no gallop and no friction rub.   No murmur heard. Pulmonary/Chest: Effort normal  and breath sounds normal. No respiratory distress. He has no wheezes. He has no rhonchi. He has no rales.  Abdominal: Soft. Normal appearance and bowel sounds are normal. He exhibits no distension, no ascites and no mass. There is no hepatosplenomegaly. There is no tenderness. There is no rebound, no guarding and no CVA tenderness.  L flank TTP  Musculoskeletal: Normal range of motion. He exhibits edema and tenderness.  Lymphadenopathy:  He has no cervical adenopathy.  Neurological: He is alert and oriented to person, place, and time. He has normal strength. No cranial nerve deficit or sensory deficit.  Skin: Skin is warm, dry and intact. No petechiae and no rash noted. He is not diaphoretic. No erythema. No pallor.  Psychiatric: He has a normal mood and affect. His behavior is normal. Judgment normal.  Nursing note and vitals reviewed.   ED Course  Procedures  DIAGNOSTIC STUDIES:  Oxygen Saturation is 100% on RA, normal by my interpretation.    COORDINATION OF CARE:  4:06 AM Discussed treatment plan with pt at bedside and pt agreed to plan.  Labs Review Labs Reviewed  BASIC METABOLIC PANEL - Abnormal; Notable for the following:    Glucose, Bld 184 (*)    All other components within normal limits  CBC WITH DIFFERENTIAL/PLATELET  URINALYSIS, ROUTINE W REFLEX MICROSCOPIC (NOT AT [facility])    Imaging Review Ct Renal Stone Study  08/22/2015  CLINICAL DATA:  Left flank pain with nausea. Onset yesterday morning. EXAM: CT ABDOMEN AND PELVIS WITHOUT CONTRAST TECHNIQUE: Multidetector CT imaging of the abdomen and pelvis was performed following the standard protocol without IV contrast. COMPARISON:  11/23/2014 FINDINGS: There are renal calculi bilaterally, measuring up to 7 mm in the right upper pole and 5 mm in the left midpole. There are no ureteral calculi. There is no hydronephrosis or ureteral dilatation. There is a 3.3 cm cyst at the upper pole the left kidney. There are unremarkable  unenhanced appearances of the liver, bile ducts, pancreas, spleen and adrenals. There is prior cholecystectomy. The abdominal aorta is normal in caliber. There is mild atherosclerotic calcification. There is no adenopathy in the abdomen or pelvis. There are normal appearances of the stomach, small bowel and colon. The appendix is normal. No acute inflammatory changes are evident in the abdomen or pelvis. There is no ascites. There is no significant abnormality in the lower chest. There is no significant skeletal lesion. There is lumbar posterior decompression with instrumented fusion from L4 through S1, utilizing pedicle screws and interconnecting vertical hardware as well as interbody fusion cages. IMPRESSION: 1. Bilateral nephrolithiasis. 2. No acute findings in the abdomen or pelvis. Electronically Signed   By: Andreas Newport M.D.   On: 08/22/2015 05:49   I have personally reviewed and evaluated these images and lab results as part of my medical decision-making.   EKG Interpretation None      MDM   Final diagnoses:  None    Patient presents emergency department for left flank pain radiating to his abdomen. He does have a history of kidney stones seen in his chart. Will obtain CT scan for evaluation. He was given morphine and Toradol for pain control. Urinalysis is pending.  6:03 AM Labs are unremarkable, urine has still not been collected. Cath has been ordered.  CT reveals only stones inside the kidney and likely not the cause of his pain.  I do not believe patient has an acute emergent cause of his pain and he has had this presentation in the past.   6:56 AM UA is unremarkable.  He appears well and in NAD.  VS remain within his normal limits and he is safe for DC.   I personally performed the services described in this documentation, which was scribed in my presence. The recorded information has been reviewed and is accurate.     Everlene Balls, MD 08/22/15 [phone]

## 2015-08-27 DIAGNOSIS — R1032 Left lower quadrant pain: Secondary | ICD-10-CM | POA: Diagnosis not present

## 2015-08-27 DIAGNOSIS — Z79899 Other long term (current) drug therapy: Secondary | ICD-10-CM | POA: Diagnosis not present

## 2015-08-27 DIAGNOSIS — K5909 Other constipation: Secondary | ICD-10-CM | POA: Diagnosis not present

## 2015-08-27 DIAGNOSIS — Z6838 Body mass index (BMI) 38.0-38.9, adult: Secondary | ICD-10-CM | POA: Diagnosis not present

## 2015-09-11 DIAGNOSIS — R109 Unspecified abdominal pain: Secondary | ICD-10-CM | POA: Diagnosis not present

## 2015-09-11 DIAGNOSIS — R197 Diarrhea, unspecified: Secondary | ICD-10-CM | POA: Diagnosis not present

## 2015-09-20 DIAGNOSIS — K5900 Constipation, unspecified: Secondary | ICD-10-CM | POA: Diagnosis not present

## 2015-09-20 DIAGNOSIS — R1032 Left lower quadrant pain: Secondary | ICD-10-CM | POA: Diagnosis not present

## 2015-09-30 DIAGNOSIS — K5900 Constipation, unspecified: Secondary | ICD-10-CM | POA: Diagnosis not present

## 2015-09-30 DIAGNOSIS — N200 Calculus of kidney: Secondary | ICD-10-CM | POA: Diagnosis not present

## 2015-09-30 DIAGNOSIS — R1084 Generalized abdominal pain: Secondary | ICD-10-CM | POA: Diagnosis not present

## 2015-09-30 DIAGNOSIS — R1012 Left upper quadrant pain: Secondary | ICD-10-CM | POA: Diagnosis not present

## 2015-10-05 ENCOUNTER — Other Ambulatory Visit: Payer: Self-pay | Admitting: Neurosurgery

## 2015-10-05 DIAGNOSIS — E039 Hypothyroidism, unspecified: Secondary | ICD-10-CM | POA: Diagnosis not present

## 2015-10-05 DIAGNOSIS — M5416 Radiculopathy, lumbar region: Secondary | ICD-10-CM | POA: Diagnosis not present

## 2015-10-05 DIAGNOSIS — F418 Other specified anxiety disorders: Secondary | ICD-10-CM | POA: Diagnosis not present

## 2015-10-05 DIAGNOSIS — K5909 Other constipation: Secondary | ICD-10-CM | POA: Diagnosis not present

## 2015-10-05 DIAGNOSIS — E7800 Pure hypercholesterolemia, unspecified: Secondary | ICD-10-CM | POA: Diagnosis not present

## 2015-10-05 DIAGNOSIS — I2510 Atherosclerotic heart disease of native coronary artery without angina pectoris: Secondary | ICD-10-CM | POA: Diagnosis not present

## 2015-10-05 DIAGNOSIS — E119 Type 2 diabetes mellitus without complications: Secondary | ICD-10-CM | POA: Diagnosis not present

## 2015-10-05 DIAGNOSIS — I10 Essential (primary) hypertension: Secondary | ICD-10-CM | POA: Diagnosis not present

## 2015-10-05 DIAGNOSIS — L281 Prurigo nodularis: Secondary | ICD-10-CM | POA: Diagnosis not present

## 2015-10-05 DIAGNOSIS — M5136 Other intervertebral disc degeneration, lumbar region: Secondary | ICD-10-CM

## 2015-10-17 ENCOUNTER — Other Ambulatory Visit: Payer: Medicare Other

## 2015-10-17 DIAGNOSIS — R1032 Left lower quadrant pain: Secondary | ICD-10-CM | POA: Diagnosis not present

## 2015-10-17 DIAGNOSIS — K5900 Constipation, unspecified: Secondary | ICD-10-CM | POA: Diagnosis not present

## 2015-10-24 ENCOUNTER — Other Ambulatory Visit: Payer: Medicare Other

## 2015-10-26 DIAGNOSIS — R0789 Other chest pain: Secondary | ICD-10-CM | POA: Diagnosis not present

## 2015-10-26 DIAGNOSIS — I2510 Atherosclerotic heart disease of native coronary artery without angina pectoris: Secondary | ICD-10-CM | POA: Diagnosis not present

## 2015-10-26 DIAGNOSIS — M5416 Radiculopathy, lumbar region: Secondary | ICD-10-CM | POA: Diagnosis not present

## 2015-10-26 DIAGNOSIS — K219 Gastro-esophageal reflux disease without esophagitis: Secondary | ICD-10-CM | POA: Diagnosis not present

## 2015-10-31 ENCOUNTER — Other Ambulatory Visit (HOSPITAL_COMMUNITY): Payer: Self-pay | Admitting: Neurosurgery

## 2015-10-31 DIAGNOSIS — M5136 Other intervertebral disc degeneration, lumbar region: Secondary | ICD-10-CM

## 2015-11-07 ENCOUNTER — Encounter (HOSPITAL_COMMUNITY): Payer: Self-pay

## 2015-11-08 ENCOUNTER — Encounter (HOSPITAL_COMMUNITY): Payer: Self-pay

## 2015-11-08 ENCOUNTER — Ambulatory Visit (HOSPITAL_COMMUNITY)
Admission: RE | Admit: 2015-11-08 | Discharge: 2015-11-08 | Disposition: A | Discharge status: Home / Self Care | Payer: Medicare Other | Source: Ambulatory Visit | Attending: Anesthesiology | Admitting: Anesthesiology

## 2015-11-08 ENCOUNTER — Encounter (HOSPITAL_COMMUNITY)
Admission: RE | Admit: 2015-11-08 | Discharge: 2015-11-08 | Disposition: A | Discharge status: Home / Self Care | Payer: Medicare Other | Source: Ambulatory Visit | Attending: Neurosurgery | Admitting: Neurosurgery

## 2015-11-08 DIAGNOSIS — I10 Essential (primary) hypertension: Secondary | ICD-10-CM | POA: Diagnosis not present

## 2015-11-08 DIAGNOSIS — R05 Cough: Secondary | ICD-10-CM | POA: Insufficient documentation

## 2015-11-08 DIAGNOSIS — Z01812 Encounter for preprocedural laboratory examination: Secondary | ICD-10-CM | POA: Diagnosis not present

## 2015-11-08 DIAGNOSIS — R0602 Shortness of breath: Secondary | ICD-10-CM | POA: Diagnosis not present

## 2015-11-08 DIAGNOSIS — K219 Gastro-esophageal reflux disease without esophagitis: Secondary | ICD-10-CM | POA: Insufficient documentation

## 2015-11-08 DIAGNOSIS — I2510 Atherosclerotic heart disease of native coronary artery without angina pectoris: Secondary | ICD-10-CM | POA: Insufficient documentation

## 2015-11-08 DIAGNOSIS — E785 Hyperlipidemia, unspecified: Secondary | ICD-10-CM | POA: Insufficient documentation

## 2015-11-08 DIAGNOSIS — E039 Hypothyroidism, unspecified: Secondary | ICD-10-CM | POA: Diagnosis not present

## 2015-11-08 DIAGNOSIS — F419 Anxiety disorder, unspecified: Secondary | ICD-10-CM | POA: Insufficient documentation

## 2015-11-08 DIAGNOSIS — F329 Major depressive disorder, single episode, unspecified: Secondary | ICD-10-CM | POA: Diagnosis not present

## 2015-11-08 DIAGNOSIS — Z01818 Encounter for other preprocedural examination: Secondary | ICD-10-CM | POA: Insufficient documentation

## 2015-11-08 DIAGNOSIS — Z87891 Personal history of nicotine dependence: Secondary | ICD-10-CM | POA: Diagnosis not present

## 2015-11-08 DIAGNOSIS — E119 Type 2 diabetes mellitus without complications: Secondary | ICD-10-CM | POA: Diagnosis not present

## 2015-11-08 DIAGNOSIS — R059 Cough, unspecified: Secondary | ICD-10-CM

## 2015-11-08 LAB — CBC
HCT: 42.6 % (ref 39.0–52.0)
HEMOGLOBIN: 14.5 g/dL (ref 13.0–17.0)
MCH: 29.7 pg (ref 26.0–34.0)
MCHC: 34 g/dL (ref 30.0–36.0)
MCV: 87.1 fL (ref 78.0–100.0)
Platelets: 208 10*3/uL (ref 150–400)
RBC: 4.89 MIL/uL (ref 4.22–5.81)
RDW: 13.5 % (ref 11.5–15.5)
WBC: 7.1 10*3/uL (ref 4.0–10.5)

## 2015-11-08 LAB — BASIC METABOLIC PANEL
ANION GAP: 14 (ref 5–15)
BUN: 23 mg/dL — ABNORMAL HIGH (ref 6–20)
CALCIUM: 9.6 mg/dL (ref 8.9–10.3)
CHLORIDE: 100 mmol/L — AB (ref 101–111)
CO2: 25 mmol/L (ref 22–32)
CREATININE: 1.16 mg/dL (ref 0.61–1.24)
GFR calc Af Amer: >60 mL/min (ref >60)
GFR calc non Af Amer: >60 mL/min (ref >60)
GLUCOSE: 133 mg/dL — AB (ref 65–99)
Potassium: 3.9 mmol/L (ref 3.5–5.1)
Sodium: 139 mmol/L (ref 135–145)

## 2015-11-08 LAB — GLUCOSE, CAPILLARY: Glucose-Capillary: 132 mg/dL — ABNORMAL HIGH (ref 65–99)

## 2015-11-08 NOTE — Progress Notes (Addendum)
PCP - Cyndi Bender - PA-C  Requesting records Cardiologist - Dr. Daneen Schick requesting records  Chest x-ray - 11/08/15 EKG - 03/16/15 Stress Test - requesting from dr Tamala Julian ECHO - requesting from dr. Tamala Julian Cardiac Cath - 2013    Patient denies shortness of breath, fever, and chest pain at PAT appointment   In patient's history TB was marked as yes, I spoke with patient about this and patient denies any knowledge of being TB positive. He stated that he has never been hospitalized for Tb or been tested for TB.  He said that he has never been told that he has TB.     Patient is positive for cough - occasionally will cough up sputum that is colorless Patient denies persistent night sweats.  He does say that at night he will sometimes get hot and will sweat.  He stated that is does not happen often at all   STOP BANG sent to PCP

## 2015-11-08 NOTE — Pre-Procedure Instructions (Signed)
[name]  11/08/2015      [facility] - [place], [place] [address] Phone: [phone] Fax: [phone]  [facility] [address], [place] [address] [place] [address] Phone: [phone] Fax: [phone]  [place], [place] - [address] [place] [address] Phone: [phone] Fax: [phone]    Your procedure is scheduled on Tuesday, November 13, 2015  Report to [facility] Admitting at  6:00 A.M.  Call this number if you have problems the morning of surgery:  [phone]   Remember:  Do not eat food or drink liquids after midnight Monday, November 12, 2015   Take these medicines the morning of surgery with A SIP OF WATER : Amlodipine ( Norvasc), Aspirin, Carvedilol ( coreg), Clopidogrel ( Plavix), Levothyroxine ( Synthroid), if needed: Tylenol OR Hydromorphone ( Dilaudid) for pain, Nitroglycerin ( Nitrostat) for chest pain  Stop taking vitamins and herbal medications; stop now   WHAT DO I DO ABOUT MY DIABETES MEDICATION?  Marland Kitchen Do not take oral diabetes medicines (pills) the morning of surgery such as Metformin ( Glucophage-XR)  How to Manage Your Diabetes Before and After Surgery  Why is it important to control my blood sugar before and after surgery? . Improving blood sugar levels before and after surgery helps healing and can limit problems. . A way of improving blood sugar control is eating a healthy diet by: o  Eating less sugar and carbohydrates o  Increasing activity/exercise o  Talking with your doctor about reaching your blood sugar goals . High blood sugars (greater than 180 mg/dL) can raise your risk of infections and slow your recovery, so you will need to focus on controlling your diabetes during the weeks before surgery. . Make sure that the doctor who takes care of your diabetes  knows about your planned surgery including the date and location.  How do I manage my blood sugar before surgery? . Check your blood sugar at least 4 times a day, starting 2 days before surgery, to make sure that the level is not too high or low. o Check your blood sugar the morning of your surgery when you wake up and every 2 hours until you get to the Short Stay unit. . If your blood sugar is less than 70 mg/dL, you will need to treat for low blood sugar: o Do not take insulin. o Treat a low blood sugar (less than 70 mg/dL) with  cup of clear juice (cranberry or apple), 4 glucose tablets, OR glucose gel. o Recheck blood sugar in 15 minutes after treatment (to make sure it is greater than 70 mg/dL). If your blood sugar is not greater than 70 mg/dL on recheck, call [phone] for further instructions. . Report your blood sugar to the short stay nurse when you get to Short Stay.  . If you are admitted to the hospital after surgery: o Your blood sugar will be checked by the staff and you will probably be given insulin after surgery (instead of oral diabetes medicines) to make sure you have good blood sugar levels. o The goal for blood sugar control after surgery is 80-180 mg/dL.    Do not wear jewelry, make-up or nail polish.  Do not wear lotions, powders, or perfumes.  You may not wear deoderant.  Do not shave 48 hours prior to surgery.  Men may shave face and neck.  Do not bring valuables to the hospital.  [facility] is not responsible for any belongings or valuables.  Contacts, dentures or bridgework may not be worn into surgery.  Leave your suitcase in the car.  After surgery it may be brought to your room.  For patients admitted to the hospital, discharge time will be determined by your treatment team.  Patients discharged the day of surgery will not be allowed to drive home.   Name and phone number of your driver:   Special instructions:    Please read over the following fact  sheets that you were given. Pain Booklet and Coughing and Deep Breathing

## 2015-11-09 LAB — HEMOGLOBIN A1C
HEMOGLOBIN A1C: 6.8 % — AB (ref 4.8–5.6)
MEAN PLASMA GLUCOSE: 148 mg/dL

## 2015-11-09 NOTE — Progress Notes (Signed)
Anesthesia Chart Review: Patient is 70 year old male scheduled for MRI of the L-spine under anesthesia on 11/13/15. MRI was ordered by Dr.Nudelman. H&P was completed on 11/07/15 (scanned under Media tab).  PMH includes: CAD (pLAD 50%, mLAD 30-60%, mLAD 95%, OM1 99%, dPDA 95%, mRCA 70%, EF 70%.The mLAD was tx with a Promus DES 08/21/11. OM and PDA treated medically due to small territories), HTN, DM2, hyperlipidemia, tuberculosis (patient denied), GERD, hypothyroidism, anxiety, depression, exertional dyspnea, cholecystectomy, ACDF, L4-S1 PLIF '10, left L3 microdiscectomy 06/2014. Former smoker. BMI 38.  Cardiologist is Dr. Tamala Julian, last visit 03/16/15. Patient was felt to be asymptomatic from a CAD standpoint. No changes were made with one year cardiology follow-up recommended.  PCP is Cyndi Bender, PA-C.   Medication includes amlodipine, aspirin 81 mg, Lipitor, Coreg, Plavix, HCTZ, Dilaudid, Synthroid, losartan, metformin, nitroglycerin.   EKG 03/19/15 EKG: sinus bradycardia (56 bpm).   Stress test 12/28/2013: 1. No reversible ischemia or infarction. 2. Normal left ventricular wall motion. 3. Left ventricular ejection fraction 67% 4. Low-risk stress test findings  Cardiac cath 08/21/2011: 1. Multivessel coronary disease with high-grade distal LAD obstruction, moderate proximal LAD obstruction, severe first obtuse marginal stenosis, moderate mid RCA stenosis, and distal PDA disease. 2. Apical ischemia due to the high-grade mid LAD stenosis. 3. Successful DES mid LAD from 99% to 0% with TIMI grade 3 flow. 4. Unsuccessful attempt at obtuse marginal #1 PCI D2 medical issues. If this vessel/lesion is reattempted, we probably have better success going from the femoral approach or left radial approach. 5. Normal LV function Per Dr. Thompson Caul cardiology consult note 09/01/2011, "the PDA and OM could be treated with PCI but it also seems that medical therapy could work since the territories are small".    11/08/15 CXR: IMPRESSION: No active cardiopulmonary disease. (CXR was done because there was question if patient had ever had TB. It was entered at some point in his history, but patient denied. Cyndi Bender also wrote that he was unaware of any history of TB in this patient.)  Preoperative labs noted. Creatinine 1.16. CBC normal. A1c 6.8.  Patient was seen by his cardiologist within the past year with no new testing ordered. Stress test within the past two years. If no acute changes/new CV symptoms then I anticipate that he may undergo MRI as planned.  George Hugh [facility]/Anesthesiology Phone [phone] 11/09/2015 2:47 PM

## 2015-11-12 NOTE — Anesthesia Preprocedure Evaluation (Addendum)
Anesthesia Evaluation  Patient identified by MRN, date of birth, ID band Patient awake    Reviewed: Allergy & Precautions, NPO status , Patient's Chart, lab work & pertinent test results, reviewed documented beta blocker date and time   Airway Mallampati: II  TM Distance: >3 FB Neck ROM: Full    Dental  (+) Edentulous Upper, Edentulous Lower, Dental Advisory Given   Pulmonary former smoker,    Pulmonary exam normal breath sounds clear to auscultation       Cardiovascular hypertension, Pt. on home beta blockers and Pt. on medications + CAD and + Cardiac Stents  Normal cardiovascular exam Rhythm:Regular Rate:Normal  No reversible ischemia on stress test 2015. No current symptoms   Neuro/Psych  Headaches, Anxiety Depression    GI/Hepatic Neg liver ROS, GERD  ,  Endo/Other  diabetes, Well Controlled, Type 2, Oral Hypoglycemic AgentsHypothyroidism Morbid obesity  Renal/GU negative Renal ROS     Musculoskeletal  (+) Arthritis ,   Abdominal   Peds  Hematology negative hematology ROS (+)   Anesthesia Other Findings   Reproductive/Obstetrics                            Anesthesia Physical  Anesthesia Plan  ASA: III  Anesthesia Plan: General   Post-op Pain Management:    Induction: Intravenous  Airway Management Planned: Oral ETT  Additional Equipment:   Intra-op Plan:   Post-operative Plan: Extubation in OR  Informed Consent: I have reviewed the patients History and Physical, chart, labs and discussed the procedure including the risks, benefits and alternatives for the proposed anesthesia with the patient or authorized representative who has indicated his/her understanding and acceptance.   Dental advisory given  Plan Discussed with: CRNA  Anesthesia Plan Comments:         Anesthesia Quick Evaluation

## 2015-11-13 ENCOUNTER — Ambulatory Visit (HOSPITAL_COMMUNITY): Payer: Medicare Other | Admitting: Anesthesiology

## 2015-11-13 ENCOUNTER — Ambulatory Visit (HOSPITAL_COMMUNITY)
Admission: RE | Admit: 2015-11-13 | Discharge: 2015-11-13 | Disposition: A | Discharge status: Home / Self Care | Payer: Medicare Other | Source: Ambulatory Visit | Attending: Neurosurgery | Admitting: Neurosurgery

## 2015-11-13 ENCOUNTER — Ambulatory Visit (HOSPITAL_COMMUNITY): Payer: Medicare Other | Admitting: Vascular Surgery

## 2015-11-13 ENCOUNTER — Encounter (HOSPITAL_COMMUNITY): Payer: Self-pay | Admitting: *Deleted

## 2015-11-13 ENCOUNTER — Encounter (HOSPITAL_COMMUNITY)
Admission: RE | Disposition: A | Discharge status: Home / Self Care | Payer: Self-pay | Source: Ambulatory Visit | Attending: Neurosurgery

## 2015-11-13 DIAGNOSIS — R531 Weakness: Secondary | ICD-10-CM | POA: Insufficient documentation

## 2015-11-13 DIAGNOSIS — I10 Essential (primary) hypertension: Secondary | ICD-10-CM | POA: Diagnosis not present

## 2015-11-13 DIAGNOSIS — Z955 Presence of coronary angioplasty implant and graft: Secondary | ICD-10-CM | POA: Diagnosis not present

## 2015-11-13 DIAGNOSIS — M5136 Other intervertebral disc degeneration, lumbar region: Secondary | ICD-10-CM

## 2015-11-13 DIAGNOSIS — K219 Gastro-esophageal reflux disease without esophagitis: Secondary | ICD-10-CM | POA: Insufficient documentation

## 2015-11-13 DIAGNOSIS — I2510 Atherosclerotic heart disease of native coronary artery without angina pectoris: Secondary | ICD-10-CM | POA: Insufficient documentation

## 2015-11-13 DIAGNOSIS — M545 Low back pain: Secondary | ICD-10-CM | POA: Insufficient documentation

## 2015-11-13 DIAGNOSIS — E119 Type 2 diabetes mellitus without complications: Secondary | ICD-10-CM | POA: Diagnosis not present

## 2015-11-13 DIAGNOSIS — Z87891 Personal history of nicotine dependence: Secondary | ICD-10-CM | POA: Diagnosis not present

## 2015-11-13 DIAGNOSIS — E039 Hypothyroidism, unspecified: Secondary | ICD-10-CM | POA: Insufficient documentation

## 2015-11-13 DIAGNOSIS — Z6837 Body mass index (BMI) 37.0-37.9, adult: Secondary | ICD-10-CM | POA: Insufficient documentation

## 2015-11-13 DIAGNOSIS — M79605 Pain in left leg: Secondary | ICD-10-CM | POA: Insufficient documentation

## 2015-11-13 DIAGNOSIS — K589 Irritable bowel syndrome without diarrhea: Secondary | ICD-10-CM | POA: Diagnosis not present

## 2015-11-13 HISTORY — PX: RADIOLOGY WITH ANESTHESIA: SHX6223

## 2015-11-13 LAB — GLUCOSE, CAPILLARY
Glucose-Capillary: 121 mg/dL — ABNORMAL HIGH (ref 65–99)
Glucose-Capillary: 105 mg/dL — ABNORMAL HIGH (ref 65–99)

## 2015-11-13 SURGERY — RADIOLOGY WITH ANESTHESIA
Anesthesia: General

## 2015-11-13 MED ORDER — LACTATED RINGERS IV SOLN
INTRAVENOUS | Status: DC
  Administered 2015-11-13: 07:00:00 via INTRAVENOUS

## 2015-11-13 MED ORDER — GADOBENATE DIMEGLUMINE 529 MG/ML IV SOLN
20.0000 mL | Freq: Once | INTRAVENOUS | Status: AC
Start: 2015-11-13 — End: 2015-11-13
  Administered 2015-11-13: 20 mL via INTRAVENOUS

## 2015-11-13 NOTE — Addendum Note (Signed)
Addendum  created 11/13/15 1337 by Rod Mae, MD   Anesthesia Attestations filed

## 2015-11-13 NOTE — Anesthesia Postprocedure Evaluation (Signed)
Anesthesia Post Note  Patient: [name]  Procedure(s) Performed: Procedure(s) (LRB): MRI OF LUMBAR SPINE W/WO CONTRAST    (RADIOLOGY WITH ANESTHESIA) (N/A)  Patient location during evaluation: PACU Anesthesia Type: General Level of consciousness: awake and alert Pain management: pain level controlled Vital Signs Assessment: post-procedure vital signs reviewed and stable Respiratory status: spontaneous breathing, nonlabored ventilation, respiratory function stable and patient connected to nasal cannula oxygen Cardiovascular status: blood pressure returned to baseline and stable Postop Assessment: no signs of nausea or vomiting Anesthetic complications: no    Last Vitals:  Vitals:   11/13/15 0639 11/13/15 1050  BP: (!) 146/48 121/64  Pulse: (!) 56 62  Resp: 20 15  Temp: 36.5 C 36.7 C    Last Pain:  Vitals:   11/13/15 0645  TempSrc:   PainSc: 8                  Bernhardt Riemenschneider L

## 2015-11-13 NOTE — Anesthesia Procedure Notes (Addendum)
Procedure Name: Intubation Date/Time: 11/13/2015 8:49 AM Performed by: Jacquiline Doe A Pre-anesthesia Checklist: Patient identified, Emergency Drugs available, Suction available and Patient being monitored Patient Re-evaluated:Patient Re-evaluated prior to inductionOxygen Delivery Method: Circle System Utilized Preoxygenation: Pre-oxygenation with 100% oxygen Intubation Type: IV induction and Cricoid Pressure applied Ventilation: Two handed mask ventilation required and Oral airway inserted - appropriate to patient size Laryngoscope Size: Mac and 4 Grade View: Grade I Tube type: Oral Number of attempts: 1 Airway Equipment and Method: Stylet,  Oral airway and LTA kit utilized Placement Confirmation: ETT inserted through vocal cords under direct vision,  positive ETCO2 and breath sounds checked- equal and bilateral Secured at: 23 cm Tube secured with: Tape Dental Injury: Teeth and Oropharynx as per pre-operative assessment

## 2015-11-13 NOTE — Transfer of Care (Signed)
Immediate Anesthesia Transfer of Care Note  Patient: [name]  Procedure(s) Performed: Procedure(s): MRI OF LUMBAR SPINE W/WO CONTRAST    (RADIOLOGY WITH ANESTHESIA) (N/A)  Patient Location: PACU  Anesthesia Type:General  Level of Consciousness: awake, oriented, sedated, patient cooperative and responds to stimulation  Airway & Oxygen Therapy: Patient Spontanous Breathing and Patient connected to nasal cannula oxygen  Post-op Assessment: Report given to RN, Post -op Vital signs reviewed and stable, Patient moving all extremities and Patient moving all extremities X 4  Post vital signs: Reviewed and stable  Last Vitals:  Vitals:   11/13/15 0639  BP: (!) 146/48  Pulse: (!) 56  Resp: 20  Temp: 36.5 C    Last Pain:  Vitals:   11/13/15 0645  TempSrc:   PainSc: 8       Patients Stated Pain Goal: 3 ([id number])  Complications: No apparent anesthesia complications

## 2015-11-13 NOTE — Addendum Note (Signed)
Addendum  created 11/13/15 1511 by Fidela Juneau, CRNA   Anesthesia Intra Blocks edited, Charge Capture section accepted, LDA updated via procedure documentation, Sign clinical note

## 2015-11-14 ENCOUNTER — Encounter (HOSPITAL_COMMUNITY): Payer: Self-pay | Admitting: Radiology

## 2015-11-14 MED FILL — Lidocaine HCl Local Soln Prefilled Syringe 100 MG/5ML (2%): INTRAMUSCULAR | Qty: 5 | Status: AC

## 2015-11-14 MED FILL — Ondansetron HCl Inj 4 MG/2ML (2 MG/ML): INTRAMUSCULAR | Qty: 2 | Status: AC

## 2015-11-14 MED FILL — Propofol IV Emul 200 MG/20ML (10 MG/ML): INTRAVENOUS | Qty: 20 | Status: AC

## 2015-11-14 MED FILL — Fentanyl Citrate Preservative Free (PF) Inj 100 MCG/2ML: INTRAMUSCULAR | Qty: 4 | Status: AC

## 2015-11-14 MED FILL — Glycopyrrolate Inj 0.2 MG/ML: INTRAMUSCULAR | Qty: 2 | Status: AC

## 2015-11-14 MED FILL — Succinylcholine Chloride Inj 20 MG/ML: INTRAMUSCULAR | Qty: 10 | Status: AC

## 2015-11-14 MED FILL — Midazolam HCl Inj 2 MG/2ML (Base Equivalent): INTRAMUSCULAR | Qty: 2 | Status: AC

## 2015-11-19 MED FILL — Phenylephrine HCl Inj 10 MG/ML: INTRAMUSCULAR | Qty: 2 | Status: AC

## 2015-11-23 DIAGNOSIS — M4726 Other spondylosis with radiculopathy, lumbar region: Secondary | ICD-10-CM | POA: Diagnosis not present

## 2015-11-23 DIAGNOSIS — M5136 Other intervertebral disc degeneration, lumbar region: Secondary | ICD-10-CM | POA: Diagnosis not present

## 2015-11-23 DIAGNOSIS — Z9889 Other specified postprocedural states: Secondary | ICD-10-CM | POA: Diagnosis not present

## 2015-11-23 DIAGNOSIS — M5440 Lumbago with sciatica, unspecified side: Secondary | ICD-10-CM | POA: Diagnosis not present

## 2015-11-23 DIAGNOSIS — Z6836 Body mass index (BMI) 36.0-36.9, adult: Secondary | ICD-10-CM | POA: Diagnosis not present

## 2015-12-03 DIAGNOSIS — R1032 Left lower quadrant pain: Secondary | ICD-10-CM | POA: Diagnosis not present

## 2015-12-03 DIAGNOSIS — F418 Other specified anxiety disorders: Secondary | ICD-10-CM | POA: Diagnosis not present

## 2015-12-03 DIAGNOSIS — H6090 Unspecified otitis externa, unspecified ear: Secondary | ICD-10-CM | POA: Diagnosis not present

## 2015-12-03 DIAGNOSIS — Z6837 Body mass index (BMI) 37.0-37.9, adult: Secondary | ICD-10-CM | POA: Diagnosis not present

## 2015-12-18 DIAGNOSIS — L853 Xerosis cutis: Secondary | ICD-10-CM | POA: Diagnosis not present

## 2015-12-18 DIAGNOSIS — L299 Pruritus, unspecified: Secondary | ICD-10-CM | POA: Diagnosis not present

## 2015-12-18 DIAGNOSIS — L300 Nummular dermatitis: Secondary | ICD-10-CM | POA: Diagnosis not present

## 2016-01-08 DIAGNOSIS — L700 Acne vulgaris: Secondary | ICD-10-CM | POA: Diagnosis not present

## 2016-01-08 DIAGNOSIS — L299 Pruritus, unspecified: Secondary | ICD-10-CM | POA: Diagnosis not present

## 2016-01-10 DIAGNOSIS — Z79899 Other long term (current) drug therapy: Secondary | ICD-10-CM | POA: Diagnosis not present

## 2016-01-10 DIAGNOSIS — E7800 Pure hypercholesterolemia, unspecified: Secondary | ICD-10-CM | POA: Diagnosis not present

## 2016-01-10 DIAGNOSIS — I2510 Atherosclerotic heart disease of native coronary artery without angina pectoris: Secondary | ICD-10-CM | POA: Diagnosis not present

## 2016-01-10 DIAGNOSIS — E119 Type 2 diabetes mellitus without complications: Secondary | ICD-10-CM | POA: Diagnosis not present

## 2016-01-10 DIAGNOSIS — R1032 Left lower quadrant pain: Secondary | ICD-10-CM | POA: Diagnosis not present

## 2016-01-10 DIAGNOSIS — Z6835 Body mass index (BMI) 35.0-35.9, adult: Secondary | ICD-10-CM | POA: Diagnosis not present

## 2016-01-10 DIAGNOSIS — F418 Other specified anxiety disorders: Secondary | ICD-10-CM | POA: Diagnosis not present

## 2016-01-10 DIAGNOSIS — Z125 Encounter for screening for malignant neoplasm of prostate: Secondary | ICD-10-CM | POA: Diagnosis not present

## 2016-01-10 DIAGNOSIS — I10 Essential (primary) hypertension: Secondary | ICD-10-CM | POA: Diagnosis not present

## 2016-01-10 DIAGNOSIS — Z23 Encounter for immunization: Secondary | ICD-10-CM | POA: Diagnosis not present

## 2016-01-10 DIAGNOSIS — E039 Hypothyroidism, unspecified: Secondary | ICD-10-CM | POA: Diagnosis not present

## 2016-01-14 DIAGNOSIS — H5211 Myopia, right eye: Secondary | ICD-10-CM | POA: Diagnosis not present

## 2016-01-14 DIAGNOSIS — Z7984 Long term (current) use of oral hypoglycemic drugs: Secondary | ICD-10-CM | POA: Diagnosis not present

## 2016-01-14 DIAGNOSIS — H5202 Hypermetropia, left eye: Secondary | ICD-10-CM | POA: Diagnosis not present

## 2016-01-14 DIAGNOSIS — E119 Type 2 diabetes mellitus without complications: Secondary | ICD-10-CM | POA: Diagnosis not present

## 2016-01-14 DIAGNOSIS — I10 Essential (primary) hypertension: Secondary | ICD-10-CM | POA: Diagnosis not present

## 2016-01-14 DIAGNOSIS — H524 Presbyopia: Secondary | ICD-10-CM | POA: Diagnosis not present

## 2016-01-14 DIAGNOSIS — H25813 Combined forms of age-related cataract, bilateral: Secondary | ICD-10-CM | POA: Diagnosis not present

## 2016-01-14 DIAGNOSIS — H52223 Regular astigmatism, bilateral: Secondary | ICD-10-CM | POA: Diagnosis not present

## 2016-01-29 ENCOUNTER — Telehealth: Payer: Self-pay | Admitting: Interventional Cardiology

## 2016-01-29 ENCOUNTER — Encounter: Payer: Self-pay | Admitting: Physician Assistant

## 2016-01-29 DIAGNOSIS — R079 Chest pain, unspecified: Secondary | ICD-10-CM | POA: Diagnosis not present

## 2016-01-29 NOTE — Telephone Encounter (Signed)
Pt c/o of Chest Pain: 1. Are you having CP right now? Little pain  2. Are you experiencing any other symptoms (ex. SOB, nausea, vomiting, sweating)? No  3. How long have you been experiencing CP? 1 week  4. Is your CP continuous or coming and going?coming and going  5. Have you taken Nitroglycerin? No    States that his GI doctor said for him to come in and see Dr. Tamala Julian

## 2016-01-29 NOTE — Telephone Encounter (Signed)
**Note De-Identified [name] Obfuscation** The pt states that he has been having pain in his throat so he went to a GI MD. The GI MD recommended that the pt be seen by cardiology before he does an EDG. He has been scheduled to see Angelena Form, PA-c tomorrow at 11:30.  The pt denies CP, SOB, dizziness, sweating, nausea or weakness at this time. He is advised that if he does develop CP to take NTG up to 3 times then to call 911. He verbalized understanding.

## 2016-01-29 NOTE — Progress Notes (Signed)
Cardiology Office Note    Date:  01/30/2016   ID:  [name], DOB 1945/09/19, MRN [id number]  PCP:  Joel Bender, PA-C  Cardiologist:  Dr. Tamala Harris  CC: mild chest pain  History of Present Illness:  [name] is a 70 y.o. male with a history of CAD s/p LAD stent 08/2011, hypertension, hyperlipidemia, diabetes mellitus, and obesity who presents to clinic for evaluation of chest pain.   He was admitted to [facility] in 12/2014 for chest pain. He ruled out for MI and underwent nuclear stress test which was low risk for ischemia; EF 67%. It was felt that his symptoms were non cardiac in nature.   Last seen by Dr. Tamala Harris in 03/2015. He reported atypical chest pain.   Today he presents to clinic for evaluation of chest pain. He had some upper abdominal pain associated with gagging that gets better with a bowel movement. Dr. Lavell Harris felt that it could possible be due to his heart and but planned for EGD but wanted cardiology clearance prior scheduling this. This has been going on a week and when it first started it was really bad but now it is a little dull pain right in the center of his chest that radiated into his throat. Now its a dull pain pain that just doesn't go away. Doesn't really bother him much. Not worse with exertion. Nothing makes it better or worse. No LE edema, orthopnea or PND. No dizziness or syncope. No palpitations. He has left leg pain that comes from his back. Had multiple previous surgeries and walks with a cane. He walks about 1.5hours a day cumulative with no issues or chest pain or SOB. Sometimes gets a little weak.   Past Medical History:  Diagnosis Date  . Anxiety   . Arthritis   . Chronic back pain    herniated disc  . Coronary artery disease    takes Plavix and ASA daily  . Depression    takes Prozac daily  . Diverticulitis    hx of  . Dizziness    occasionally and states [name] is aware  . DJD (degenerative joint disease)   . GERD (gastroesophageal reflux  disease)    takes an OTC antacid  . Gout    yrs ago and doesn't take any meds  . Hard of hearing    wears hearing aids  . Headache(784.0)    occasionally  . History of colon polyps    benign  . History of kidney stones   . Hyperlipidemia    takes Atorvastatin daily  . Hypertension    takes Amlodipine,Losartan,and Coreg daily  . Hypothyroidism    takes Synthroid daily  . IBS (irritable bowel syndrome)   . Shortness of breath dyspnea    with exertion  . Tuberculosis    Patient denies Tb  . Type II diabetes mellitus (HCC)    takes Metformin daily  . Weakness    numbness and tingling in left leg    Past Surgical History:  Procedure Laterality Date  . ANTERIOR CERVICAL DECOMP/DISCECTOMY FUSION  04/2007   C4-5; C6-7  . BACK SURGERY     "3 times; last time was screws in lower back 10/2008"  . CHOLECYSTECTOMY  1970's  . COLONOSCOPY    . CORONARY ANGIOPLASTY  1999/2013   1 stent  . CYSTOSCOPY    . LEFT HEART CATHETERIZATION WITH CORONARY ANGIOGRAM N/A 08/21/2011   Procedure: LEFT HEART CATHETERIZATION WITH CORONARY ANGIOGRAM;  Surgeon: [name]  Joel Bridegroom, Harris;  Location: [facility] CATH LAB;  Service: Cardiovascular;  Laterality: N/A;  . LITHOTRIPSY    . LUMBAR LAMINECTOMY/DECOMPRESSION MICRODISCECTOMY Left 06/14/2014   Procedure: LEFT LUMBAR THREE-FOUR LANINOTOMY AND MICRODISKECTOMY.;  Surgeon: Joel Spangle, Harris;  Location: [place] NEURO ORS;  Service: Neurosurgery;  Laterality: Left;  left  . PCI  5/09/2-13   LAD  . RADIOLOGY WITH ANESTHESIA N/A 11/13/2015   Procedure: MRI OF LUMBAR SPINE W/WO CONTRAST    (RADIOLOGY WITH ANESTHESIA);  Surgeon: [name];  Location: [facility];  Service: Radiology;  Laterality: N/A;  . SHOULDER ARTHROSCOPY W/ ROTATOR CUFF REPAIR  ~ 2011   left    Current Medications: Outpatient Medications Prior to Visit  Joel Sig Dispense Refill  . acetaminophen (TYLENOL) 500 MG tablet Take 500 mg by mouth every 6 (six) hours as needed for mild pain.      Marland Kitchen amLODipine (NORVASC) 10 MG tablet Take 10 mg by mouth daily.    Marland Kitchen aspirin 81 MG tablet Take 81 mg by mouth daily.    Marland Kitchen atorvastatin (LIPITOR) 20 MG tablet Take 20 mg by mouth daily.    . carvedilol (COREG) 25 MG tablet Take 25 mg by mouth 2 (two) times daily with a meal.    . clopidogrel (PLAVIX) 75 MG tablet Take 75 mg by mouth daily with breakfast.    . hydrochlorothiazide (HYDRODIURIL) 12.5 MG tablet Take 12.5 mg by mouth daily.    Marland Kitchen HYDROmorphone (DILAUDID) 2 MG tablet Take 2 mg by mouth every 4 (four) hours as needed for moderate pain or severe pain.   0  . levothyroxine (SYNTHROID, LEVOTHROID) 50 MCG tablet Take 50 mcg by mouth daily before breakfast.    . losartan (COZAAR) 100 MG tablet Take 100 mg by mouth daily.    . metFORMIN (GLUCOPHAGE-XR) 500 MG 24 hr tablet Take 500 mg by mouth 2 (two) times daily.     . nitroGLYCERIN (NITROSTAT) 0.4 MG SL tablet Place 0.4 mg under the tongue every 5 (five) minutes as needed. For chest pain     No facility-administered medications prior to visit.      Allergies:   Sulfa antibiotics   Social History   Social History  . Marital status: Single    Spouse name: N/A  . Number of children: N/A  . Years of education: N/A   Social History Main Topics  . Smoking status: Former Research scientist (life sciences)  . Smokeless tobacco: Current User    Types: Chew  . Alcohol use No  . Drug use: No  . Sexual activity: No   Other Topics Concern  . None   Social History Narrative  . None     Family History:  The patient's family history includes Arthritis in his mother; Cancer in his father; Healthy in his sister and sister; Heart attack in his brother; Heart disease in his brother; Hypertension in his brother.     ROS:   Please see the history of present illness.    ROS All other systems reviewed and are negative.   PHYSICAL EXAM:   VS:  BP 130/82   Pulse (!) 58   Ht 5\' 9"  (1.753 m)   Wt 249 lb 12.8 oz (113.3 kg)   BMI 36.89 kg/m    GEN: Well nourished,  well developed, in no acute distress , obese HEENT: normal  Neck: no JVD, carotid bruits, or masses Cardiac: RRR; no murmurs, rubs, or gallops,no edema  Respiratory:  clear to auscultation bilaterally,  normal work of breathing GI: soft, nontender, nondistended, + BS MS: no deformity or atrophy  Skin: warm and dry, no rash Neuro:  Alert and Oriented x 3, Strength and sensation are intact Psych: euthymic mood, full affect  Wt Readings from Last 3 Encounters:  01/30/16 249 lb 12.8 oz (113.3 kg)  11/13/15 256 lb (116.1 kg)  11/13/15 256 lb (116.1 kg)      Studies/Labs Reviewed:   EKG:  EKG is ordered today.  The ekg ordered today demonstrates sinus brady HR 57.   Recent Labs: 11/08/2015: BUN 23; Creatinine, Ser 1.16; Hemoglobin 14.5; Platelets 208; Potassium 3.9; Sodium 139   Lipid Panel    Component Value Date/Time   CHOL 175 07/31/2011 0352   TRIG 161 (H) 07/31/2011 0352   HDL 38 (L) 07/31/2011 0352   CHOLHDL 4.6 07/31/2011 0352   VLDL 32 07/31/2011 0352   LDLCALC 105 (H) 07/31/2011 0352    Additional studies/ records that were reviewed today include:  Myoview 12/2013 IMPRESSION: 1. No reversible ischemia or infarction. 2. Normal left ventricular wall motion. 3. Left ventricular ejection fraction 67% 4. Low-risk stress test findings*.   ASSESSMENT & PLAN:   Chest pain: atypical but has some chest tightness that radiates to his throat. Will get a lexiscan myovew prior to his EGD.   DM2: HgA1c 6.8. Continue current reigmen  HTN: BP well controlled on current reigmen  Hyperlipidemia: continue statin   Obesity: Body mass index is 36.89 kg/m. Counseled on diet and exercise.     Joel Adjustments/Labs and Tests Ordered: Current medicines are reviewed at length with the patient today.  Concerns regarding medicines are outlined above.  Joel changes, Labs and Tests ordered today are listed in the Patient Instructions below. There are no Patient Instructions  on file for this visit.   Signed, Angelena Form, PA-C  01/30/2016 11:28 AM    [facility] [place], [place], [place]  [address] Phone: [phone]; Fax: [phone]

## 2016-01-30 ENCOUNTER — Encounter: Payer: Self-pay | Admitting: Physician Assistant

## 2016-01-30 ENCOUNTER — Ambulatory Visit (INDEPENDENT_AMBULATORY_CARE_PROVIDER_SITE_OTHER): Payer: Medicare Other | Admitting: Physician Assistant

## 2016-01-30 VITALS — BP 130/82 | HR 58 | Ht 69.0 in | Wt 249.8 lb

## 2016-01-30 DIAGNOSIS — I25119 Atherosclerotic heart disease of native coronary artery with unspecified angina pectoris: Secondary | ICD-10-CM

## 2016-01-30 DIAGNOSIS — E785 Hyperlipidemia, unspecified: Secondary | ICD-10-CM

## 2016-01-30 DIAGNOSIS — I10 Essential (primary) hypertension: Secondary | ICD-10-CM

## 2016-01-30 DIAGNOSIS — Z01818 Encounter for other preprocedural examination: Secondary | ICD-10-CM

## 2016-01-30 DIAGNOSIS — E118 Type 2 diabetes mellitus with unspecified complications: Secondary | ICD-10-CM

## 2016-01-30 DIAGNOSIS — I209 Angina pectoris, unspecified: Secondary | ICD-10-CM

## 2016-01-30 NOTE — Patient Instructions (Addendum)
Medication Instructions:  Your physician recommends that you continue on your current medications as directed. Please refer to the Current Medication list given to you today.   Labwork: None ordered  Testing/Procedures: Your physician has requested that you have a lexiscan myoview. For further information please visit [email or web address]. Please follow instruction sheet, as given.   Follow-Up: Your physician recommends that you schedule a follow-up appointment in: BASED UPON YOUR TEST RESULTS   Any Other Special Instructions Will Be Listed Below (If Applicable). Pharmacologic Stress Electrocardiogram A pharmacologic stress electrocardiogram is a heart (cardiac) test that uses nuclear imaging to evaluate the blood supply to your heart. This test may also be called a pharmacologic stress electrocardiography. Pharmacologic means that a medicine is used to increase your heart rate and blood pressure.  This stress test is done to find areas of poor blood flow to the heart by determining the extent of coronary artery disease (CAD). Some people exercise on a treadmill, which naturally increases the blood flow to the heart. For those people unable to exercise on a treadmill, a medicine is used. This medicine stimulates your heart and will cause your heart to beat harder and more quickly, as if you were exercising.  Pharmacologic stress tests can help determine:  The adequacy of blood flow to your heart during increased levels of activity in order to clear you for discharge home.  The extent of coronary artery blockage caused by CAD.  Your prognosis if you have suffered a heart attack.  The effectiveness of cardiac procedures done, such as an angioplasty, which can increase the circulation in your coronary arteries.  Causes of chest pain or pressure. LET [facility] CARE PROVIDER KNOW ABOUT:  Any allergies you have.  All medicines you are taking, including vitamins, herbs, eye drops, creams,  and over-the-counter medicines.  Previous problems you or members of your family have had with the use of anesthetics.  Any blood disorders you have.  Previous surgeries you have had.  Medical conditions you have.  Possibility of pregnancy, if this applies.  If you are currently breastfeeding. RISKS AND COMPLICATIONS Generally, this is a safe procedure. However, as with any procedure, complications can occur. Possible complications include:  You develop pain or pressure in the following areas:  Chest.  Jaw or neck.  Between your shoulder blades.  Radiating down your left arm.  Headache.  Dizziness or light-headedness.  Shortness of breath.  Increased or irregular heartbeat.  Low blood pressure.  Nausea or vomiting.  Flushing.  Redness going up the arm and slight pain during injection of medicine.  Heart attack (rare). BEFORE THE PROCEDURE   Avoid all forms of caffeine for 24 hours before your test or as directed by your health care provider. This includes coffee, tea (even decaffeinated tea), caffeinated sodas, chocolate, cocoa, and certain pain medicines.  Follow your health care provider's instructions regarding eating and drinking before the test.  Take your medicines as directed at regular times with water unless instructed otherwise. Exceptions may include:  If you have diabetes, ask how you are to take your insulin or pills. It is common to adjust insulin dosing the morning of the test.  If you are taking beta-blocker medicines, it is important to talk to your health care provider about these medicines well before the date of your test. Taking beta-blocker medicines may interfere with the test. In some cases, these medicines need to be changed or stopped 24 hours or more before the test.  If you  wear a nitroglycerin patch, it may need to be removed prior to the test. Ask your health care provider if the patch should be removed before the test.  If you  use an inhaler for any breathing condition, bring it with you to the test.  If you are an outpatient, bring a snack so you can eat right after the stress phase of the test.  Do not smoke for 4 hours prior to the test or as directed by your health care provider.  Do not apply lotions, powders, creams, or oils on your chest prior to the test.  Wear comfortable shoes and clothing. Let your health care provider know if you were unable to complete or follow the preparations for your test. PROCEDURE   Multiple patches (electrodes) will be put on your chest. If needed, small areas of your chest may be shaved to get better contact with the electrodes. Once the electrodes are attached to your body, multiple wires will be attached to the electrodes, and your heart rate will be monitored.  An IV access will be started. A nuclear trace (isotope) is given. The isotope may be given intravenously, or it may be swallowed. Nuclear refers to several types of radioactive isotopes, and the nuclear isotope lights up the arteries so that the nuclear images are clear. The isotope is absorbed by your body. This results in low radiation exposure.  A resting nuclear image is taken to show how your heart functions at rest.  A medicine is given through the IV access.  A second scan is done about 1 hour after the medicine injection and determines how your heart functions under stress.  During this stress phase, you will be connected to an electrocardiogram machine. Your blood pressure and oxygen levels will be monitored. AFTER THE PROCEDURE   Your heart rate and blood pressure will be monitored after the test.  You may return to your normal schedule, including diet,activities, and medicines, unless your health care provider tells you otherwise.   This information is not intended to replace advice given to you by your health care provider. Make sure you discuss any questions you have with your health care  provider.   Document Released: 08/17/2008 Document Revised: 04/05/2013 Document Reviewed: 12/06/2012 Elsevier Interactive Patient Education [employer].     If you need a refill on your cardiac medications before your next appointment, please call your pharmacy.

## 2016-02-04 ENCOUNTER — Telehealth (HOSPITAL_COMMUNITY): Payer: Self-pay | Admitting: *Deleted

## 2016-02-04 NOTE — Telephone Encounter (Signed)
Patient given detailed instructions per Myocardial Perfusion Study Information Sheet for the test on 02/06/16 at 0730. Patient notified to arrive 15 minutes early and that it is imperative to arrive on time for appointment to keep from having the test rescheduled.  If you need to cancel or reschedule your appointment, please call the office within 24 hours of your appointment. Failure to do so may result in a cancellation of your appointment, and a $50 no show fee. Patient verbalized understanding.Laurel Smeltz, Ranae Palms

## 2016-02-05 DIAGNOSIS — L299 Pruritus, unspecified: Secondary | ICD-10-CM | POA: Diagnosis not present

## 2016-02-05 DIAGNOSIS — L853 Xerosis cutis: Secondary | ICD-10-CM | POA: Diagnosis not present

## 2016-02-05 DIAGNOSIS — L219 Seborrheic dermatitis, unspecified: Secondary | ICD-10-CM | POA: Diagnosis not present

## 2016-02-06 ENCOUNTER — Ambulatory Visit (HOSPITAL_COMMUNITY): Payer: Medicare Other | Attending: Cardiovascular Disease

## 2016-02-06 DIAGNOSIS — I25119 Atherosclerotic heart disease of native coronary artery with unspecified angina pectoris: Secondary | ICD-10-CM

## 2016-02-06 DIAGNOSIS — I10 Essential (primary) hypertension: Secondary | ICD-10-CM | POA: Diagnosis not present

## 2016-02-06 DIAGNOSIS — Z01818 Encounter for other preprocedural examination: Secondary | ICD-10-CM | POA: Diagnosis not present

## 2016-02-06 LAB — MYOCARDIAL PERFUSION IMAGING
CHL CUP NUCLEAR SSS: 5
LHR: 0.32
LVDIAVOL: 103 mL (ref 62–150)
LVSYSVOL: 46 mL
NUC STRESS TID: 0.91
Peak HR: 73 {beats}/min
Rest HR: 56 {beats}/min
SDS: 2
SRS: 3

## 2016-02-06 MED ORDER — TECHNETIUM TC 99M TETROFOSMIN IV KIT
32.5000 | PACK | Freq: Once | INTRAVENOUS | Status: AC | PRN
  Administered 2016-02-06: 32.5 via INTRAVENOUS
  Filled 2016-02-06: qty 33

## 2016-02-06 MED ORDER — REGADENOSON 0.4 MG/5ML IV SOLN
0.4000 mg | Freq: Once | INTRAVENOUS | Status: AC
  Administered 2016-02-06: 0.4 mg via INTRAVENOUS

## 2016-02-06 MED ORDER — TECHNETIUM TC 99M TETROFOSMIN IV KIT
11.0000 | PACK | Freq: Once | INTRAVENOUS | Status: AC | PRN
  Administered 2016-02-06: 11 via INTRAVENOUS
  Filled 2016-02-06: qty 11

## 2016-02-20 DIAGNOSIS — R0789 Other chest pain: Secondary | ICD-10-CM | POA: Diagnosis not present

## 2016-02-20 DIAGNOSIS — R1013 Epigastric pain: Secondary | ICD-10-CM | POA: Diagnosis not present

## 2016-02-20 DIAGNOSIS — R079 Chest pain, unspecified: Secondary | ICD-10-CM | POA: Diagnosis not present

## 2016-02-20 DIAGNOSIS — K219 Gastro-esophageal reflux disease without esophagitis: Secondary | ICD-10-CM | POA: Diagnosis not present

## 2016-03-04 DIAGNOSIS — H25811 Combined forms of age-related cataract, right eye: Secondary | ICD-10-CM | POA: Diagnosis not present

## 2016-03-04 DIAGNOSIS — E119 Type 2 diabetes mellitus without complications: Secondary | ICD-10-CM | POA: Diagnosis not present

## 2016-03-25 DIAGNOSIS — Z87891 Personal history of nicotine dependence: Secondary | ICD-10-CM | POA: Diagnosis not present

## 2016-03-25 DIAGNOSIS — Z7984 Long term (current) use of oral hypoglycemic drugs: Secondary | ICD-10-CM | POA: Diagnosis not present

## 2016-03-25 DIAGNOSIS — E119 Type 2 diabetes mellitus without complications: Secondary | ICD-10-CM | POA: Diagnosis not present

## 2016-03-25 DIAGNOSIS — H25811 Combined forms of age-related cataract, right eye: Secondary | ICD-10-CM | POA: Diagnosis not present

## 2016-03-25 DIAGNOSIS — I252 Old myocardial infarction: Secondary | ICD-10-CM | POA: Diagnosis not present

## 2016-03-25 DIAGNOSIS — Z955 Presence of coronary angioplasty implant and graft: Secondary | ICD-10-CM | POA: Diagnosis not present

## 2016-03-25 DIAGNOSIS — H25812 Combined forms of age-related cataract, left eye: Secondary | ICD-10-CM | POA: Diagnosis not present

## 2016-03-25 DIAGNOSIS — I10 Essential (primary) hypertension: Secondary | ICD-10-CM | POA: Diagnosis not present

## 2016-03-25 DIAGNOSIS — Z9841 Cataract extraction status, right eye: Secondary | ICD-10-CM | POA: Diagnosis not present

## 2016-03-25 DIAGNOSIS — Z7902 Long term (current) use of antithrombotics/antiplatelets: Secondary | ICD-10-CM | POA: Diagnosis not present

## 2016-03-25 DIAGNOSIS — Z7982 Long term (current) use of aspirin: Secondary | ICD-10-CM | POA: Diagnosis not present

## 2016-03-25 DIAGNOSIS — E039 Hypothyroidism, unspecified: Secondary | ICD-10-CM | POA: Diagnosis not present

## 2016-03-25 DIAGNOSIS — Z961 Presence of intraocular lens: Secondary | ICD-10-CM | POA: Diagnosis not present

## 2016-03-25 DIAGNOSIS — Z79899 Other long term (current) drug therapy: Secondary | ICD-10-CM | POA: Diagnosis not present

## 2016-04-01 DIAGNOSIS — J111 Influenza due to unidentified influenza virus with other respiratory manifestations: Secondary | ICD-10-CM | POA: Diagnosis not present

## 2016-04-10 DIAGNOSIS — I2510 Atherosclerotic heart disease of native coronary artery without angina pectoris: Secondary | ICD-10-CM | POA: Diagnosis not present

## 2016-04-10 DIAGNOSIS — Z9181 History of falling: Secondary | ICD-10-CM | POA: Diagnosis not present

## 2016-04-10 DIAGNOSIS — K219 Gastro-esophageal reflux disease without esophagitis: Secondary | ICD-10-CM | POA: Diagnosis not present

## 2016-04-10 DIAGNOSIS — I10 Essential (primary) hypertension: Secondary | ICD-10-CM | POA: Diagnosis not present

## 2016-04-10 DIAGNOSIS — E119 Type 2 diabetes mellitus without complications: Secondary | ICD-10-CM | POA: Diagnosis not present

## 2016-04-10 DIAGNOSIS — E7800 Pure hypercholesterolemia, unspecified: Secondary | ICD-10-CM | POA: Diagnosis not present

## 2016-04-10 DIAGNOSIS — R1032 Left lower quadrant pain: Secondary | ICD-10-CM | POA: Diagnosis not present

## 2016-04-10 DIAGNOSIS — E039 Hypothyroidism, unspecified: Secondary | ICD-10-CM | POA: Diagnosis not present

## 2016-04-10 DIAGNOSIS — Z6836 Body mass index (BMI) 36.0-36.9, adult: Secondary | ICD-10-CM | POA: Diagnosis not present

## 2016-04-15 DIAGNOSIS — M5416 Radiculopathy, lumbar region: Secondary | ICD-10-CM | POA: Diagnosis not present

## 2016-04-15 DIAGNOSIS — M5440 Lumbago with sciatica, unspecified side: Secondary | ICD-10-CM | POA: Diagnosis not present

## 2016-04-15 DIAGNOSIS — M4726 Other spondylosis with radiculopathy, lumbar region: Secondary | ICD-10-CM | POA: Diagnosis not present

## 2016-04-15 DIAGNOSIS — Z9889 Other specified postprocedural states: Secondary | ICD-10-CM | POA: Diagnosis not present

## 2016-04-15 DIAGNOSIS — M5136 Other intervertebral disc degeneration, lumbar region: Secondary | ICD-10-CM | POA: Diagnosis not present

## 2016-05-06 DIAGNOSIS — L299 Pruritus, unspecified: Secondary | ICD-10-CM | POA: Diagnosis not present

## 2016-05-06 DIAGNOSIS — D0462 Carcinoma in situ of skin of left upper limb, including shoulder: Secondary | ICD-10-CM | POA: Diagnosis not present

## 2016-05-06 DIAGNOSIS — L219 Seborrheic dermatitis, unspecified: Secondary | ICD-10-CM | POA: Diagnosis not present

## 2016-05-06 DIAGNOSIS — L0100 Impetigo, unspecified: Secondary | ICD-10-CM | POA: Diagnosis not present

## 2016-05-16 DIAGNOSIS — E119 Type 2 diabetes mellitus without complications: Secondary | ICD-10-CM | POA: Diagnosis not present

## 2016-05-27 DIAGNOSIS — I10 Essential (primary) hypertension: Secondary | ICD-10-CM | POA: Diagnosis not present

## 2016-05-27 DIAGNOSIS — Z87891 Personal history of nicotine dependence: Secondary | ICD-10-CM | POA: Diagnosis not present

## 2016-05-27 DIAGNOSIS — H25812 Combined forms of age-related cataract, left eye: Secondary | ICD-10-CM | POA: Diagnosis not present

## 2016-05-27 DIAGNOSIS — E039 Hypothyroidism, unspecified: Secondary | ICD-10-CM | POA: Diagnosis not present

## 2016-05-27 DIAGNOSIS — E119 Type 2 diabetes mellitus without complications: Secondary | ICD-10-CM | POA: Diagnosis not present

## 2016-05-27 DIAGNOSIS — Z7982 Long term (current) use of aspirin: Secondary | ICD-10-CM | POA: Diagnosis not present

## 2016-05-27 DIAGNOSIS — Z955 Presence of coronary angioplasty implant and graft: Secondary | ICD-10-CM | POA: Diagnosis not present

## 2016-05-27 DIAGNOSIS — E785 Hyperlipidemia, unspecified: Secondary | ICD-10-CM | POA: Diagnosis not present

## 2016-05-27 DIAGNOSIS — Z7902 Long term (current) use of antithrombotics/antiplatelets: Secondary | ICD-10-CM | POA: Diagnosis not present

## 2016-05-27 DIAGNOSIS — I2510 Atherosclerotic heart disease of native coronary artery without angina pectoris: Secondary | ICD-10-CM | POA: Diagnosis not present

## 2016-05-27 DIAGNOSIS — H9190 Unspecified hearing loss, unspecified ear: Secondary | ICD-10-CM | POA: Diagnosis not present

## 2016-05-27 DIAGNOSIS — Z79899 Other long term (current) drug therapy: Secondary | ICD-10-CM | POA: Diagnosis not present

## 2016-05-27 DIAGNOSIS — Z961 Presence of intraocular lens: Secondary | ICD-10-CM | POA: Diagnosis not present

## 2016-07-14 DIAGNOSIS — I10 Essential (primary) hypertension: Secondary | ICD-10-CM | POA: Diagnosis not present

## 2016-07-14 DIAGNOSIS — Z6836 Body mass index (BMI) 36.0-36.9, adult: Secondary | ICD-10-CM | POA: Diagnosis not present

## 2016-07-14 DIAGNOSIS — E039 Hypothyroidism, unspecified: Secondary | ICD-10-CM | POA: Diagnosis not present

## 2016-07-14 DIAGNOSIS — M545 Low back pain: Secondary | ICD-10-CM | POA: Diagnosis not present

## 2016-07-14 DIAGNOSIS — R1032 Left lower quadrant pain: Secondary | ICD-10-CM | POA: Diagnosis not present

## 2016-07-14 DIAGNOSIS — E119 Type 2 diabetes mellitus without complications: Secondary | ICD-10-CM | POA: Diagnosis not present

## 2016-07-14 DIAGNOSIS — F418 Other specified anxiety disorders: Secondary | ICD-10-CM | POA: Diagnosis not present

## 2016-07-14 DIAGNOSIS — I2510 Atherosclerotic heart disease of native coronary artery without angina pectoris: Secondary | ICD-10-CM | POA: Diagnosis not present

## 2016-07-14 DIAGNOSIS — E7800 Pure hypercholesterolemia, unspecified: Secondary | ICD-10-CM | POA: Diagnosis not present

## 2016-07-14 DIAGNOSIS — E669 Obesity, unspecified: Secondary | ICD-10-CM | POA: Diagnosis not present

## 2016-08-05 DIAGNOSIS — L300 Nummular dermatitis: Secondary | ICD-10-CM | POA: Diagnosis not present

## 2016-08-05 DIAGNOSIS — R233 Spontaneous ecchymoses: Secondary | ICD-10-CM | POA: Diagnosis not present

## 2016-08-05 DIAGNOSIS — L570 Actinic keratosis: Secondary | ICD-10-CM | POA: Diagnosis not present

## 2016-08-19 DIAGNOSIS — E119 Type 2 diabetes mellitus without complications: Secondary | ICD-10-CM | POA: Diagnosis not present

## 2016-08-19 DIAGNOSIS — E669 Obesity, unspecified: Secondary | ICD-10-CM | POA: Diagnosis not present

## 2016-08-19 DIAGNOSIS — Z6836 Body mass index (BMI) 36.0-36.9, adult: Secondary | ICD-10-CM | POA: Diagnosis not present

## 2016-08-19 DIAGNOSIS — R1032 Left lower quadrant pain: Secondary | ICD-10-CM | POA: Diagnosis not present

## 2016-08-19 DIAGNOSIS — M5416 Radiculopathy, lumbar region: Secondary | ICD-10-CM | POA: Diagnosis not present

## 2016-08-19 DIAGNOSIS — I2510 Atherosclerotic heart disease of native coronary artery without angina pectoris: Secondary | ICD-10-CM | POA: Diagnosis not present

## 2016-08-28 DIAGNOSIS — M961 Postlaminectomy syndrome, not elsewhere classified: Secondary | ICD-10-CM | POA: Diagnosis not present

## 2016-08-28 DIAGNOSIS — K219 Gastro-esophageal reflux disease without esophagitis: Secondary | ICD-10-CM | POA: Diagnosis not present

## 2016-08-28 DIAGNOSIS — E119 Type 2 diabetes mellitus without complications: Secondary | ICD-10-CM | POA: Diagnosis not present

## 2016-08-28 DIAGNOSIS — M5416 Radiculopathy, lumbar region: Secondary | ICD-10-CM | POA: Diagnosis not present

## 2016-08-28 DIAGNOSIS — G894 Chronic pain syndrome: Secondary | ICD-10-CM | POA: Diagnosis not present

## 2016-09-29 DIAGNOSIS — M5416 Radiculopathy, lumbar region: Secondary | ICD-10-CM | POA: Diagnosis not present

## 2016-09-29 DIAGNOSIS — G894 Chronic pain syndrome: Secondary | ICD-10-CM | POA: Diagnosis not present

## 2016-09-29 DIAGNOSIS — E119 Type 2 diabetes mellitus without complications: Secondary | ICD-10-CM | POA: Diagnosis not present

## 2016-09-29 DIAGNOSIS — M961 Postlaminectomy syndrome, not elsewhere classified: Secondary | ICD-10-CM | POA: Diagnosis not present

## 2016-09-29 DIAGNOSIS — K219 Gastro-esophageal reflux disease without esophagitis: Secondary | ICD-10-CM | POA: Diagnosis not present

## 2016-10-10 DIAGNOSIS — Z1389 Encounter for screening for other disorder: Secondary | ICD-10-CM | POA: Diagnosis not present

## 2016-10-10 DIAGNOSIS — Z136 Encounter for screening for cardiovascular disorders: Secondary | ICD-10-CM | POA: Diagnosis not present

## 2016-10-10 DIAGNOSIS — Z125 Encounter for screening for malignant neoplasm of prostate: Secondary | ICD-10-CM | POA: Diagnosis not present

## 2016-10-10 DIAGNOSIS — Z9181 History of falling: Secondary | ICD-10-CM | POA: Diagnosis not present

## 2016-10-10 DIAGNOSIS — E785 Hyperlipidemia, unspecified: Secondary | ICD-10-CM | POA: Diagnosis not present

## 2016-10-10 DIAGNOSIS — Z0000 Encounter for general adult medical examination without abnormal findings: Secondary | ICD-10-CM | POA: Diagnosis not present

## 2016-10-10 DIAGNOSIS — Z6836 Body mass index (BMI) 36.0-36.9, adult: Secondary | ICD-10-CM | POA: Diagnosis not present

## 2016-10-22 DIAGNOSIS — R296 Repeated falls: Secondary | ICD-10-CM | POA: Diagnosis not present

## 2016-10-22 DIAGNOSIS — I10 Essential (primary) hypertension: Secondary | ICD-10-CM | POA: Diagnosis not present

## 2016-10-22 DIAGNOSIS — I2510 Atherosclerotic heart disease of native coronary artery without angina pectoris: Secondary | ICD-10-CM | POA: Diagnosis not present

## 2016-10-22 DIAGNOSIS — F418 Other specified anxiety disorders: Secondary | ICD-10-CM | POA: Diagnosis not present

## 2016-10-22 DIAGNOSIS — E119 Type 2 diabetes mellitus without complications: Secondary | ICD-10-CM | POA: Diagnosis not present

## 2016-10-22 DIAGNOSIS — R1032 Left lower quadrant pain: Secondary | ICD-10-CM | POA: Diagnosis not present

## 2016-10-22 DIAGNOSIS — Z79899 Other long term (current) drug therapy: Secondary | ICD-10-CM | POA: Diagnosis not present

## 2016-10-22 DIAGNOSIS — Z6836 Body mass index (BMI) 36.0-36.9, adult: Secondary | ICD-10-CM | POA: Diagnosis not present

## 2016-10-27 DIAGNOSIS — M961 Postlaminectomy syndrome, not elsewhere classified: Secondary | ICD-10-CM | POA: Diagnosis not present

## 2016-10-27 DIAGNOSIS — M5416 Radiculopathy, lumbar region: Secondary | ICD-10-CM | POA: Diagnosis not present

## 2016-10-27 DIAGNOSIS — E119 Type 2 diabetes mellitus without complications: Secondary | ICD-10-CM | POA: Diagnosis not present

## 2016-10-27 DIAGNOSIS — K219 Gastro-esophageal reflux disease without esophagitis: Secondary | ICD-10-CM | POA: Diagnosis not present

## 2016-10-27 DIAGNOSIS — G894 Chronic pain syndrome: Secondary | ICD-10-CM | POA: Diagnosis not present

## 2016-10-31 DIAGNOSIS — M5416 Radiculopathy, lumbar region: Secondary | ICD-10-CM | POA: Diagnosis not present

## 2016-10-31 DIAGNOSIS — M961 Postlaminectomy syndrome, not elsewhere classified: Secondary | ICD-10-CM | POA: Diagnosis not present

## 2016-10-31 DIAGNOSIS — M5440 Lumbago with sciatica, unspecified side: Secondary | ICD-10-CM | POA: Diagnosis not present

## 2016-11-05 ENCOUNTER — Telehealth: Payer: Self-pay | Admitting: Interventional Cardiology

## 2016-11-05 NOTE — Telephone Encounter (Signed)
Request for surgical clearance:  1. What type of surgery is being performed?  TFESI L3-4   2. When is this surgery scheduled?  Unknown   3. Are there any medications that need to be held prior to surgery and how long? Plavix  7days prior   4. Name of physician performing surgery? Dr. Maryjean Ka   5. What is your office phone and fax number?  Fax [phone]   Pt last seen Oct 2017 by Bonney Leitz, PA-C

## 2016-11-06 DIAGNOSIS — L299 Pruritus, unspecified: Secondary | ICD-10-CM | POA: Diagnosis not present

## 2016-11-06 DIAGNOSIS — L570 Actinic keratosis: Secondary | ICD-10-CM | POA: Diagnosis not present

## 2016-11-06 DIAGNOSIS — L300 Nummular dermatitis: Secondary | ICD-10-CM | POA: Diagnosis not present

## 2016-11-06 DIAGNOSIS — L700 Acne vulgaris: Secondary | ICD-10-CM | POA: Diagnosis not present

## 2016-11-07 NOTE — Telephone Encounter (Signed)
He has not been seen in 2 years.  Okay to hold Plavix if needed.

## 2016-11-10 DIAGNOSIS — H66009 Acute suppurative otitis media without spontaneous rupture of ear drum, unspecified ear: Secondary | ICD-10-CM | POA: Diagnosis not present

## 2016-11-11 NOTE — Telephone Encounter (Signed)
Clearance faxed to Dr. Maryjean Ka office. Confirmation received that fax was successful.

## 2016-11-25 DIAGNOSIS — R531 Weakness: Secondary | ICD-10-CM | POA: Diagnosis not present

## 2016-11-25 DIAGNOSIS — F329 Major depressive disorder, single episode, unspecified: Secondary | ICD-10-CM | POA: Diagnosis not present

## 2016-11-25 DIAGNOSIS — Z6835 Body mass index (BMI) 35.0-35.9, adult: Secondary | ICD-10-CM | POA: Diagnosis not present

## 2016-12-01 DIAGNOSIS — M5416 Radiculopathy, lumbar region: Secondary | ICD-10-CM | POA: Diagnosis not present

## 2016-12-05 DIAGNOSIS — Z6834 Body mass index (BMI) 34.0-34.9, adult: Secondary | ICD-10-CM | POA: Diagnosis not present

## 2016-12-05 DIAGNOSIS — K5909 Other constipation: Secondary | ICD-10-CM | POA: Diagnosis not present

## 2016-12-05 DIAGNOSIS — E039 Hypothyroidism, unspecified: Secondary | ICD-10-CM | POA: Diagnosis not present

## 2016-12-05 DIAGNOSIS — R634 Abnormal weight loss: Secondary | ICD-10-CM | POA: Diagnosis not present

## 2016-12-05 DIAGNOSIS — R1032 Left lower quadrant pain: Secondary | ICD-10-CM | POA: Diagnosis not present

## 2016-12-06 ENCOUNTER — Encounter (HOSPITAL_COMMUNITY): Payer: Self-pay | Admitting: Emergency Medicine

## 2016-12-06 ENCOUNTER — Emergency Department (HOSPITAL_COMMUNITY)
Admission: EM | Admit: 2016-12-06 | Discharge: 2016-12-07 | Disposition: A | Discharge status: Home / Self Care | Payer: Medicare Other | Attending: Emergency Medicine | Admitting: Emergency Medicine

## 2016-12-06 DIAGNOSIS — Z8719 Personal history of other diseases of the digestive system: Secondary | ICD-10-CM | POA: Insufficient documentation

## 2016-12-06 DIAGNOSIS — Z7982 Long term (current) use of aspirin: Secondary | ICD-10-CM | POA: Diagnosis not present

## 2016-12-06 DIAGNOSIS — F17220 Nicotine dependence, chewing tobacco, uncomplicated: Secondary | ICD-10-CM | POA: Diagnosis not present

## 2016-12-06 DIAGNOSIS — E119 Type 2 diabetes mellitus without complications: Secondary | ICD-10-CM | POA: Diagnosis not present

## 2016-12-06 DIAGNOSIS — I10 Essential (primary) hypertension: Secondary | ICD-10-CM | POA: Diagnosis not present

## 2016-12-06 DIAGNOSIS — Z79899 Other long term (current) drug therapy: Secondary | ICD-10-CM | POA: Insufficient documentation

## 2016-12-06 DIAGNOSIS — I2510 Atherosclerotic heart disease of native coronary artery without angina pectoris: Secondary | ICD-10-CM | POA: Insufficient documentation

## 2016-12-06 DIAGNOSIS — R1032 Left lower quadrant pain: Secondary | ICD-10-CM | POA: Diagnosis not present

## 2016-12-06 DIAGNOSIS — K5730 Diverticulosis of large intestine without perforation or abscess without bleeding: Secondary | ICD-10-CM | POA: Diagnosis not present

## 2016-12-06 DIAGNOSIS — N200 Calculus of kidney: Secondary | ICD-10-CM | POA: Diagnosis not present

## 2016-12-06 DIAGNOSIS — Z7984 Long term (current) use of oral hypoglycemic drugs: Secondary | ICD-10-CM | POA: Diagnosis not present

## 2016-12-06 DIAGNOSIS — Z7901 Long term (current) use of anticoagulants: Secondary | ICD-10-CM | POA: Diagnosis not present

## 2016-12-06 DIAGNOSIS — R109 Unspecified abdominal pain: Secondary | ICD-10-CM | POA: Diagnosis present

## 2016-12-06 LAB — COMPREHENSIVE METABOLIC PANEL
ALT: 21 U/L (ref 17–63)
AST: 14 U/L — ABNORMAL LOW (ref 15–41)
Albumin: 4.4 g/dL (ref 3.5–5.0)
Alkaline Phosphatase: 40 U/L (ref 38–126)
Anion gap: 11 (ref 5–15)
BUN: 17 mg/dL (ref 6–20)
CALCIUM: 9.4 mg/dL (ref 8.9–10.3)
CHLORIDE: 99 mmol/L — AB (ref 101–111)
CO2: 24 mmol/L (ref 22–32)
CREATININE: 0.91 mg/dL (ref 0.61–1.24)
Glucose, Bld: 128 mg/dL — ABNORMAL HIGH (ref 65–99)
Potassium: 3.4 mmol/L — ABNORMAL LOW (ref 3.5–5.1)
Sodium: 134 mmol/L — ABNORMAL LOW (ref 135–145)
TOTAL PROTEIN: 7.8 g/dL (ref 6.5–8.1)
Total Bilirubin: 0.7 mg/dL (ref 0.3–1.2)

## 2016-12-06 LAB — CBC
HEMATOCRIT: 41.5 % (ref 39.0–52.0)
HEMOGLOBIN: 15.1 g/dL (ref 13.0–17.0)
MCH: 31 pg (ref 26.0–34.0)
MCHC: 36.4 g/dL — ABNORMAL HIGH (ref 30.0–36.0)
MCV: 85.2 fL (ref 78.0–100.0)
Platelets: 229 10*3/uL (ref 150–400)
RBC: 4.87 MIL/uL (ref 4.22–5.81)
RDW: 13.3 % (ref 11.5–15.5)
WBC: 11.6 10*3/uL — AB (ref 4.0–10.5)

## 2016-12-06 LAB — LIPASE, BLOOD: LIPASE: 53 U/L — AB (ref 11–51)

## 2016-12-06 NOTE — ED Triage Notes (Signed)
Pt reports L side abd pain for "a while." Went to PCP for this and was diagnosed with diverticulitis last week. Pt reports he feels worse now. Has also had diarrhea for the past week. No emesis.

## 2016-12-07 ENCOUNTER — Emergency Department (HOSPITAL_COMMUNITY): Payer: Medicare Other

## 2016-12-07 DIAGNOSIS — R1032 Left lower quadrant pain: Secondary | ICD-10-CM | POA: Diagnosis not present

## 2016-12-07 DIAGNOSIS — K5730 Diverticulosis of large intestine without perforation or abscess without bleeding: Secondary | ICD-10-CM | POA: Diagnosis not present

## 2016-12-07 DIAGNOSIS — N200 Calculus of kidney: Secondary | ICD-10-CM | POA: Diagnosis not present

## 2016-12-07 MED ORDER — IOPAMIDOL (ISOVUE-300) INJECTION 61%
INTRAVENOUS | Status: AC
  Filled 2016-12-07: qty 100

## 2016-12-07 MED ORDER — IOPAMIDOL (ISOVUE-300) INJECTION 61%
100.0000 mL | Freq: Once | INTRAVENOUS | Status: AC | PRN
  Administered 2016-12-07: 100 mL via INTRAVENOUS

## 2016-12-07 MED ORDER — SODIUM CHLORIDE 0.9 % IV BOLUS (SEPSIS)
1000.0000 mL | Freq: Once | INTRAVENOUS | Status: AC
  Administered 2016-12-07: 1000 mL via INTRAVENOUS

## 2016-12-07 MED ORDER — LORAZEPAM 2 MG/ML IJ SOLN
1.0000 mg | INTRAMUSCULAR | Status: DC
  Administered 2016-12-07: 1 mg via INTRAVENOUS
  Filled 2016-12-07: qty 1

## 2016-12-07 NOTE — Discharge Instructions (Addendum)
Today you received contrast with your CT scan.  Please skip your next three days of metformin to protect your kidneys.  During this time you need to watch what you eat as your sugars may be high.    Your lab work and CT scan were overall reassuring.   Please FINISH TAKING YOUR ANTIBIOTICS (AUGMENTIN) until completed as instructed by your doctor. If your pain persists please contact your gastroenterologist for re-evaluation.   Return to ED for fever, worsening abdominal pain, vomiting, bloody diarrhea

## 2016-12-07 NOTE — ED Provider Notes (Signed)
[facility] DEPT Provider Note   CSN: [id number] Arrival date & time: 12/06/16  1807     History   Chief Complaint Chief Complaint  Patient presents with  . Abdominal Pain    HPI [name] is a 71 y.o. male who presents for evaluation of left-sided pain. His pain has been present for one year however he reports it worsened yesterday.  He reports that last week he was seen by his PCP and he was diagnosed with diverticulitis, started on Augmentin and sent home.  He reports that yesterday it became significantly worse and he has since developed chills, nausea, not feeling well.  He has had a few episodes of diarrhea since starting the antibiotics.  He has been taking 2mg  dilaudid tablets for his pain that were rx by his PCP.  He reports that this helps his pain however he is on high dose narcotics at baseline for his pain.  He feels like his belly is more swollen than normal.    HPI  Past Medical History:  Diagnosis Date  . Anxiety   . Arthritis   . Chronic back pain    herniated disc  . Coronary artery disease    takes Plavix and ASA daily  . Depression    takes Prozac daily  . Diverticulitis    hx of  . Diverticulitis   . Dizziness    occasionally and states Dr.Smith is aware  . DJD (degenerative joint disease)   . GERD (gastroesophageal reflux disease)    takes an OTC antacid  . Gout    yrs ago and doesn't take any meds  . Hard of hearing    wears hearing aids  . Headache(784.0)    occasionally  . History of colon polyps    benign  . History of kidney stones   . Hyperlipidemia    takes Atorvastatin daily  . Hypertension    takes Amlodipine,Losartan,and Coreg daily  . Hypothyroidism    takes Synthroid daily  . IBS (irritable bowel syndrome)   . Shortness of breath dyspnea    with exertion  . Tuberculosis    Patient denies Tb  . Type II diabetes mellitus (HCC)    takes Metformin daily  . Weakness    numbness and tingling in left leg    Patient  Active Problem List   Diagnosis Date Noted  . HNP (herniated nucleus pulposus), lumbar 06/14/2014  . Atypical chest pain 12/28/2013  . Unstable angina ([place]) 12/27/2013  . 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[place]) 03/14/2013    Class: Chronic  . Hyperlipidemia 03/14/2013  . Chest pain 07/30/2011  . Obesity, unspecified 07/30/2011  . Essential hypertension, benign 07/30/2011  . Diabetes mellitus type 2, controlled, with complications ([place]) 94/17/4081    Past Surgical History:  Procedure Laterality Date  . ANTERIOR CERVICAL DECOMP/DISCECTOMY FUSION  04/2007   C4-5; C6-7  . BACK SURGERY     "3 times; last time was screws in lower back 10/2008"  . CHOLECYSTECTOMY  1970's  . COLONOSCOPY    . CORONARY ANGIOPLASTY  1999/2013   1 stent  . CYSTOSCOPY    . LEFT HEART CATHETERIZATION WITH CORONARY ANGIOGRAM N/A 08/21/2011   Procedure: LEFT HEART CATHETERIZATION WITH CORONARY ANGIOGRAM;  Surgeon: Sinclair Grooms, MD;  Location: [facility] CATH LAB;  Service: Cardiovascular;  Laterality: N/A;  . LITHOTRIPSY    . LUMBAR LAMINECTOMY/DECOMPRESSION MICRODISCECTOMY Left 06/14/2014   Procedure: LEFT LUMBAR THREE-FOUR LANINOTOMY AND MICRODISKECTOMY.;  Surgeon: Hosie Spangle, MD;  Location: [facility] NEURO ORS;  Service: Neurosurgery;  Laterality: Left;  left  . PCI  5/09/2-13   LAD  . RADIOLOGY WITH ANESTHESIA N/A 11/13/2015   Procedure: MRI OF LUMBAR SPINE W/WO CONTRAST    (RADIOLOGY WITH ANESTHESIA);  Surgeon: Medication Radiologist, MD;  Location: [facility];  Service: Radiology;  Laterality: N/A;  . SHOULDER ARTHROSCOPY W/ ROTATOR CUFF REPAIR  ~ 2011   left       Home Medications    Prior to Admission medications   Medication Sig Start Date End Date Taking? Authorizing Provider  acetaminophen (TYLENOL) 500 MG tablet Take 500 mg by mouth every 6 (six) hours as needed for mild pain.    [provider]  amLODipine (NORVASC) 10 MG tablet Take 10 mg by mouth daily.     [provider]  aspirin 81 MG tablet Take 81 mg by mouth daily.    [provider]  atorvastatin (LIPITOR) 20 MG tablet Take 20 mg by mouth daily.    [provider]  carvedilol (COREG) 25 MG tablet Take 25 mg by mouth 2 (two) times daily with a meal.    [provider]  clopidogrel (PLAVIX) 75 MG tablet Take 75 mg by mouth daily with breakfast.    [provider]  hydrochlorothiazide (HYDRODIURIL) 12.5 MG tablet Take 12.5 mg by mouth daily.    [provider]  HYDROmorphone (DILAUDID) 2 MG tablet Take 2 mg by mouth every 4 (four) hours as needed for moderate pain or severe pain.  10/06/15   [provider]  levothyroxine (SYNTHROID, LEVOTHROID) 50 MCG tablet Take 50 mcg by mouth daily before breakfast. 02/26/15   [provider]  losartan (COZAAR) 100 MG tablet Take 100 mg by mouth daily.    [provider]  metFORMIN (GLUCOPHAGE-XR) 500 MG 24 hr tablet Take 500 mg by mouth 2 (two) times daily.     [provider]  nitroGLYCERIN (NITROSTAT) 0.4 MG SL tablet Place 0.4 mg under the tongue every 5 (five) minutes as needed. For chest pain    [provider]    Family History Family History  Problem Relation Age of Onset  . Cancer Father        colon  . Arthritis Mother   . Healthy Sister   . Heart attack Brother   . Heart disease Brother   . Hypertension Brother   . Healthy Sister     Social History Social History  Substance Use Topics  . Smoking status: Former Research scientist (life sciences)  . Smokeless tobacco: Current User    Types: Chew  . Alcohol use No     Allergies   Sulfa antibiotics   Review of Systems Review of Systems  Constitutional: Positive for appetite change, chills and fatigue. Negative for fever.  HENT: Negative for ear pain and sore throat.   Eyes: Negative for pain and visual disturbance.  Respiratory: Negative for cough and shortness of breath.   Cardiovascular: Negative for  chest pain and palpitations.  Gastrointestinal: Positive for abdominal distention, abdominal pain, diarrhea and nausea. Negative for blood in stool and vomiting.  Genitourinary: Negative for dysuria and hematuria.  Musculoskeletal: Negative for arthralgias and back pain.  Skin: Negative for color change and rash.  Neurological: Negative for seizures, syncope and headaches.  All other systems reviewed and are negative.    Physical Exam Updated Vital Signs BP (!) 150/78 (BP Location: Right Arm)   Pulse 67  Temp 97.8 F (36.6 C) (Oral)   Resp 18   SpO2 97%   Physical Exam  Constitutional: He appears well-developed and well-nourished.  HENT:  Head: Normocephalic and atraumatic.  Eyes: Conjunctivae are normal. No scleral icterus.  Neck: Normal range of motion.  Cardiovascular: Normal rate, regular rhythm and intact distal pulses.  Exam reveals no friction rub.   No murmur heard. Pulmonary/Chest: Effort normal and breath sounds normal. No stridor. No respiratory distress. He has no wheezes.  Abdominal: Soft. Bowel sounds are normal. He exhibits no distension and no mass. There is tenderness in the left lower quadrant. There is no rigidity, no rebound and no guarding.  Musculoskeletal: He exhibits no edema or deformity.  Neurological: He is alert. He exhibits normal muscle tone.  Skin: Skin is warm.  Psychiatric: He has a normal mood and affect. His behavior is normal.  Nursing note and vitals reviewed.    ED Treatments / Results  Labs (all labs ordered are listed, but only abnormal results are displayed) Labs Reviewed  LIPASE, BLOOD - Abnormal; Notable for the following:       Result Value   Lipase 53 (*)    All other components within normal limits  COMPREHENSIVE METABOLIC PANEL - Abnormal; Notable for the following:    Sodium 134 (*)    Potassium 3.4 (*)    Chloride 99 (*)    Glucose, Bld 128 (*)    AST 14 (*)    All other components within normal limits  CBC -  Abnormal; Notable for the following:    WBC 11.6 (*)    MCHC 36.4 (*)    All other components within normal limits  URINALYSIS, ROUTINE W REFLEX MICROSCOPIC    EKG  EKG Interpretation None       Radiology No results found.  Procedures Procedures (including critical care time)  Medications Ordered in ED Medications  LORazepam (ATIVAN) injection 1 mg (1 mg Intravenous Given 12/07/16 0025)  sodium chloride 0.9 % bolus 1,000 mL (1,000 mLs Intravenous New Bag/Given 12/07/16 0024)     Initial Impression / Assessment and Plan / ED Course  I have reviewed the triage vital signs and the nursing notes.  Pertinent labs & imaging results that were available during my care of the patient were reviewed by me and considered in my medical decision making (see chart for details).    At shift change care was transferred to Carmon Sails PA-C who will follow pending studies, re-evaulate and determine disposition.      Final Clinical Impressions(s) / ED Diagnoses   Final diagnoses:  None    New Prescriptions New Prescriptions   No medications on file     Ollen Gross 12/07/16 0102    Palumbo, April, MD 12/07/16 [phone]

## 2016-12-07 NOTE — ED Provider Notes (Signed)
Patient was handed off to me by previous EDP Phylliss Bob at shift change pending CT a/p. Please see previous EDP note for full HPI and ROS. Briefly, patient is a 70 yo male with ppmh of DM, diverticulitis, HTN, IBS, kidney stones and colon polyps who presents to ED for evaluation of LLQ abdominal pain. Diagnosed with diverticulitis by PCP two days ago, took one dose of augmentin since. To me, pt states his LLQ pain has been there intermittently for many months but suddenly worsened yesterday.   On exam VS WNL. He has large abdomen, soft with mild diffuse left mid and LLQ tenderness. No guarding or rigidity. No CVA or suprapubic tenderness.   ED work up has been reviewed. CBC, CMP, lipase reviewed and all reassuring. CT a/p without evidence of SBO, appendicitis, diverticulitis. U/A has not been collected, given location of abdominal pain and lack of urinary sxs will d/c this as UTI is low on differential and pt is on augmentin. Discussed results and plan to d/c with patient and wife at bedside. Encouraged high fiber, hydration and bowel regimen as outpatient. He has GI care established, he is to f/u if LLQ abdominal discomfort persists.   Patient, ED treatment and discharge plan was discussed with supervising physician who also evaluated the patient and is agreeable with plan.    Kinnie Feil, PA-C 12/07/16 0257    Randal Buba, April, MD 12/07/16 [phone]

## 2016-12-12 DIAGNOSIS — R110 Nausea: Secondary | ICD-10-CM | POA: Diagnosis not present

## 2016-12-12 DIAGNOSIS — Z6834 Body mass index (BMI) 34.0-34.9, adult: Secondary | ICD-10-CM | POA: Diagnosis not present

## 2016-12-12 DIAGNOSIS — R1032 Left lower quadrant pain: Secondary | ICD-10-CM | POA: Diagnosis not present

## 2017-02-05 DIAGNOSIS — Z23 Encounter for immunization: Secondary | ICD-10-CM | POA: Diagnosis not present

## 2017-02-05 DIAGNOSIS — E039 Hypothyroidism, unspecified: Secondary | ICD-10-CM | POA: Diagnosis not present

## 2017-02-05 DIAGNOSIS — I10 Essential (primary) hypertension: Secondary | ICD-10-CM | POA: Diagnosis not present

## 2017-02-05 DIAGNOSIS — E119 Type 2 diabetes mellitus without complications: Secondary | ICD-10-CM | POA: Diagnosis not present

## 2017-02-05 DIAGNOSIS — Z6835 Body mass index (BMI) 35.0-35.9, adult: Secondary | ICD-10-CM | POA: Diagnosis not present

## 2017-02-05 DIAGNOSIS — I2510 Atherosclerotic heart disease of native coronary artery without angina pectoris: Secondary | ICD-10-CM | POA: Diagnosis not present

## 2017-02-05 DIAGNOSIS — E7800 Pure hypercholesterolemia, unspecified: Secondary | ICD-10-CM | POA: Diagnosis not present

## 2017-02-05 DIAGNOSIS — F418 Other specified anxiety disorders: Secondary | ICD-10-CM | POA: Diagnosis not present

## 2017-02-05 DIAGNOSIS — R1032 Left lower quadrant pain: Secondary | ICD-10-CM | POA: Diagnosis not present

## 2017-02-10 DIAGNOSIS — L700 Acne vulgaris: Secondary | ICD-10-CM | POA: Diagnosis not present

## 2017-02-10 DIAGNOSIS — L219 Seborrheic dermatitis, unspecified: Secondary | ICD-10-CM | POA: Diagnosis not present

## 2017-02-10 DIAGNOSIS — L299 Pruritus, unspecified: Secondary | ICD-10-CM | POA: Diagnosis not present

## 2017-02-10 DIAGNOSIS — L570 Actinic keratosis: Secondary | ICD-10-CM | POA: Diagnosis not present

## 2017-02-19 DIAGNOSIS — Z6835 Body mass index (BMI) 35.0-35.9, adult: Secondary | ICD-10-CM | POA: Diagnosis not present

## 2017-02-19 DIAGNOSIS — B029 Zoster without complications: Secondary | ICD-10-CM | POA: Diagnosis not present

## 2017-02-19 DIAGNOSIS — E119 Type 2 diabetes mellitus without complications: Secondary | ICD-10-CM | POA: Diagnosis not present

## 2017-03-25 DIAGNOSIS — H1033 Unspecified acute conjunctivitis, bilateral: Secondary | ICD-10-CM | POA: Diagnosis not present

## 2017-03-25 DIAGNOSIS — H1089 Other conjunctivitis: Secondary | ICD-10-CM | POA: Diagnosis not present

## 2017-03-25 DIAGNOSIS — I10 Essential (primary) hypertension: Secondary | ICD-10-CM | POA: Diagnosis not present

## 2017-04-08 DIAGNOSIS — I10 Essential (primary) hypertension: Secondary | ICD-10-CM | POA: Diagnosis not present

## 2017-04-08 DIAGNOSIS — H1033 Unspecified acute conjunctivitis, bilateral: Secondary | ICD-10-CM | POA: Diagnosis not present

## 2017-04-08 DIAGNOSIS — H1089 Other conjunctivitis: Secondary | ICD-10-CM | POA: Diagnosis not present

## 2017-04-10 DIAGNOSIS — M545 Low back pain: Secondary | ICD-10-CM | POA: Diagnosis not present

## 2017-04-10 DIAGNOSIS — Z6836 Body mass index (BMI) 36.0-36.9, adult: Secondary | ICD-10-CM | POA: Diagnosis not present

## 2017-04-10 DIAGNOSIS — B029 Zoster without complications: Secondary | ICD-10-CM | POA: Diagnosis not present

## 2017-05-11 DIAGNOSIS — E119 Type 2 diabetes mellitus without complications: Secondary | ICD-10-CM | POA: Diagnosis not present

## 2017-05-11 DIAGNOSIS — I2510 Atherosclerotic heart disease of native coronary artery without angina pectoris: Secondary | ICD-10-CM | POA: Diagnosis not present

## 2017-05-11 DIAGNOSIS — R21 Rash and other nonspecific skin eruption: Secondary | ICD-10-CM | POA: Diagnosis not present

## 2017-05-11 DIAGNOSIS — I10 Essential (primary) hypertension: Secondary | ICD-10-CM | POA: Diagnosis not present

## 2017-05-11 DIAGNOSIS — R1032 Left lower quadrant pain: Secondary | ICD-10-CM | POA: Diagnosis not present

## 2017-05-19 DIAGNOSIS — L299 Pruritus, unspecified: Secondary | ICD-10-CM | POA: Diagnosis not present

## 2017-05-19 DIAGNOSIS — L853 Xerosis cutis: Secondary | ICD-10-CM | POA: Diagnosis not present

## 2017-05-19 DIAGNOSIS — L300 Nummular dermatitis: Secondary | ICD-10-CM | POA: Diagnosis not present

## 2017-05-27 DIAGNOSIS — H01002 Unspecified blepharitis right lower eyelid: Secondary | ICD-10-CM | POA: Diagnosis not present

## 2017-05-27 DIAGNOSIS — H04123 Dry eye syndrome of bilateral lacrimal glands: Secondary | ICD-10-CM | POA: Diagnosis not present

## 2017-05-27 DIAGNOSIS — E119 Type 2 diabetes mellitus without complications: Secondary | ICD-10-CM | POA: Diagnosis not present

## 2017-05-27 DIAGNOSIS — H26493 Other secondary cataract, bilateral: Secondary | ICD-10-CM | POA: Diagnosis not present

## 2017-06-18 DIAGNOSIS — R233 Spontaneous ecchymoses: Secondary | ICD-10-CM | POA: Diagnosis not present

## 2017-06-18 DIAGNOSIS — L300 Nummular dermatitis: Secondary | ICD-10-CM | POA: Diagnosis not present

## 2017-06-18 DIAGNOSIS — L853 Xerosis cutis: Secondary | ICD-10-CM | POA: Diagnosis not present

## 2017-07-02 DIAGNOSIS — H04123 Dry eye syndrome of bilateral lacrimal glands: Secondary | ICD-10-CM | POA: Diagnosis not present

## 2017-07-02 DIAGNOSIS — H01002 Unspecified blepharitis right lower eyelid: Secondary | ICD-10-CM | POA: Diagnosis not present

## 2017-07-02 DIAGNOSIS — H01005 Unspecified blepharitis left lower eyelid: Secondary | ICD-10-CM | POA: Diagnosis not present

## 2017-08-03 DIAGNOSIS — R112 Nausea with vomiting, unspecified: Secondary | ICD-10-CM | POA: Diagnosis not present

## 2017-08-03 DIAGNOSIS — Z6838 Body mass index (BMI) 38.0-38.9, adult: Secondary | ICD-10-CM | POA: Diagnosis not present

## 2017-08-03 DIAGNOSIS — R600 Localized edema: Secondary | ICD-10-CM | POA: Diagnosis not present

## 2017-08-10 DIAGNOSIS — R1032 Left lower quadrant pain: Secondary | ICD-10-CM | POA: Diagnosis not present

## 2017-08-10 DIAGNOSIS — Z125 Encounter for screening for malignant neoplasm of prostate: Secondary | ICD-10-CM | POA: Diagnosis not present

## 2017-08-10 DIAGNOSIS — I2510 Atherosclerotic heart disease of native coronary artery without angina pectoris: Secondary | ICD-10-CM | POA: Diagnosis not present

## 2017-08-10 DIAGNOSIS — E039 Hypothyroidism, unspecified: Secondary | ICD-10-CM | POA: Diagnosis not present

## 2017-08-10 DIAGNOSIS — E119 Type 2 diabetes mellitus without complications: Secondary | ICD-10-CM | POA: Diagnosis not present

## 2017-08-10 DIAGNOSIS — Z79899 Other long term (current) drug therapy: Secondary | ICD-10-CM | POA: Diagnosis not present

## 2017-08-10 DIAGNOSIS — E7800 Pure hypercholesterolemia, unspecified: Secondary | ICD-10-CM | POA: Diagnosis not present

## 2017-08-13 DIAGNOSIS — K529 Noninfective gastroenteritis and colitis, unspecified: Secondary | ICD-10-CM | POA: Diagnosis not present

## 2017-08-13 DIAGNOSIS — K219 Gastro-esophageal reflux disease without esophagitis: Secondary | ICD-10-CM | POA: Diagnosis not present

## 2017-08-13 DIAGNOSIS — R1032 Left lower quadrant pain: Secondary | ICD-10-CM | POA: Diagnosis not present

## 2017-08-31 DIAGNOSIS — R1032 Left lower quadrant pain: Secondary | ICD-10-CM | POA: Diagnosis not present

## 2017-08-31 DIAGNOSIS — R600 Localized edema: Secondary | ICD-10-CM | POA: Diagnosis not present

## 2017-08-31 DIAGNOSIS — I10 Essential (primary) hypertension: Secondary | ICD-10-CM | POA: Diagnosis not present

## 2017-09-10 DIAGNOSIS — I10 Essential (primary) hypertension: Secondary | ICD-10-CM | POA: Diagnosis not present

## 2017-09-10 DIAGNOSIS — I2510 Atherosclerotic heart disease of native coronary artery without angina pectoris: Secondary | ICD-10-CM | POA: Diagnosis not present

## 2017-09-10 DIAGNOSIS — R079 Chest pain, unspecified: Secondary | ICD-10-CM | POA: Diagnosis not present

## 2017-09-10 DIAGNOSIS — R1032 Left lower quadrant pain: Secondary | ICD-10-CM | POA: Diagnosis not present

## 2017-09-24 DIAGNOSIS — L853 Xerosis cutis: Secondary | ICD-10-CM | POA: Diagnosis not present

## 2017-09-24 DIAGNOSIS — L299 Pruritus, unspecified: Secondary | ICD-10-CM | POA: Diagnosis not present

## 2017-09-24 DIAGNOSIS — L300 Nummular dermatitis: Secondary | ICD-10-CM | POA: Diagnosis not present

## 2017-09-24 DIAGNOSIS — D485 Neoplasm of uncertain behavior of skin: Secondary | ICD-10-CM | POA: Diagnosis not present

## 2017-10-08 DIAGNOSIS — D485 Neoplasm of uncertain behavior of skin: Secondary | ICD-10-CM | POA: Diagnosis not present

## 2017-10-12 DIAGNOSIS — E669 Obesity, unspecified: Secondary | ICD-10-CM | POA: Diagnosis not present

## 2017-10-12 DIAGNOSIS — Z0000 Encounter for general adult medical examination without abnormal findings: Secondary | ICD-10-CM | POA: Diagnosis not present

## 2017-10-12 DIAGNOSIS — E785 Hyperlipidemia, unspecified: Secondary | ICD-10-CM | POA: Diagnosis not present

## 2017-10-12 DIAGNOSIS — Z6837 Body mass index (BMI) 37.0-37.9, adult: Secondary | ICD-10-CM | POA: Diagnosis not present

## 2017-10-12 DIAGNOSIS — Z125 Encounter for screening for malignant neoplasm of prostate: Secondary | ICD-10-CM | POA: Diagnosis not present

## 2017-10-12 DIAGNOSIS — Z1331 Encounter for screening for depression: Secondary | ICD-10-CM | POA: Diagnosis not present

## 2017-10-12 DIAGNOSIS — Z1339 Encounter for screening examination for other mental health and behavioral disorders: Secondary | ICD-10-CM | POA: Diagnosis not present

## 2017-10-12 DIAGNOSIS — Z136 Encounter for screening for cardiovascular disorders: Secondary | ICD-10-CM | POA: Diagnosis not present

## 2017-10-12 DIAGNOSIS — Z9181 History of falling: Secondary | ICD-10-CM | POA: Diagnosis not present

## 2017-11-14 ENCOUNTER — Emergency Department (HOSPITAL_COMMUNITY)
Admission: EM | Admit: 2017-11-14 | Discharge: 2017-11-14 | Disposition: A | Discharge status: Home / Self Care | Payer: Medicare Other | Attending: Emergency Medicine | Admitting: Emergency Medicine

## 2017-11-14 ENCOUNTER — Other Ambulatory Visit: Payer: Self-pay

## 2017-11-14 ENCOUNTER — Encounter (HOSPITAL_COMMUNITY): Payer: Self-pay | Admitting: Pharmacy Technician

## 2017-11-14 ENCOUNTER — Emergency Department (HOSPITAL_COMMUNITY): Payer: Medicare Other

## 2017-11-14 DIAGNOSIS — Z7982 Long term (current) use of aspirin: Secondary | ICD-10-CM | POA: Diagnosis not present

## 2017-11-14 DIAGNOSIS — Y939 Activity, unspecified: Secondary | ICD-10-CM | POA: Diagnosis not present

## 2017-11-14 DIAGNOSIS — Y929 Unspecified place or not applicable: Secondary | ICD-10-CM | POA: Diagnosis not present

## 2017-11-14 DIAGNOSIS — Z7984 Long term (current) use of oral hypoglycemic drugs: Secondary | ICD-10-CM | POA: Insufficient documentation

## 2017-11-14 DIAGNOSIS — S0003XA Contusion of scalp, initial encounter: Secondary | ICD-10-CM

## 2017-11-14 DIAGNOSIS — Z87891 Personal history of nicotine dependence: Secondary | ICD-10-CM | POA: Diagnosis not present

## 2017-11-14 DIAGNOSIS — I2510 Atherosclerotic heart disease of native coronary artery without angina pectoris: Secondary | ICD-10-CM | POA: Insufficient documentation

## 2017-11-14 DIAGNOSIS — W19XXXA Unspecified fall, initial encounter: Secondary | ICD-10-CM

## 2017-11-14 DIAGNOSIS — E039 Hypothyroidism, unspecified: Secondary | ICD-10-CM | POA: Insufficient documentation

## 2017-11-14 DIAGNOSIS — E119 Type 2 diabetes mellitus without complications: Secondary | ICD-10-CM | POA: Diagnosis not present

## 2017-11-14 DIAGNOSIS — Z79899 Other long term (current) drug therapy: Secondary | ICD-10-CM | POA: Insufficient documentation

## 2017-11-14 DIAGNOSIS — Y999 Unspecified external cause status: Secondary | ICD-10-CM | POA: Insufficient documentation

## 2017-11-14 DIAGNOSIS — S0990XA Unspecified injury of head, initial encounter: Secondary | ICD-10-CM | POA: Diagnosis not present

## 2017-11-14 DIAGNOSIS — T148XXA Other injury of unspecified body region, initial encounter: Secondary | ICD-10-CM

## 2017-11-14 DIAGNOSIS — S199XXA Unspecified injury of neck, initial encounter: Secondary | ICD-10-CM | POA: Diagnosis not present

## 2017-11-14 DIAGNOSIS — R531 Weakness: Secondary | ICD-10-CM | POA: Diagnosis not present

## 2017-11-14 NOTE — ED Notes (Signed)
Patient verbalizes understanding of discharge instructions. Opportunity for questioning and answers were provided. 

## 2017-11-14 NOTE — ED Notes (Signed)
Patient transported to CT 

## 2017-11-14 NOTE — ED Triage Notes (Signed)
Pt bib [place] with a fall trying to climb into truck, fell backwards and hit head on pavement. Pt takes plavix. Denies LOC, no neurological deficits noted. Posterior headache upon arrival 3/10. Alert and oriented X4. Small hematoma to posterior head with small abrasion. 18g RAC. 150/84, 96% on RA, HR 68, 239 CBG.

## 2017-11-14 NOTE — ED Provider Notes (Signed)
Medical screening examination/treatment/procedure(s) were conducted as a shared visit with non-physician practitioner(s) and myself.  I personally evaluated the patient during the encounter.  None She fell climbing out of his truck.  No loss of consciousness.  He does take Plavix.  Patient is alert and oriented.  Denies any significant pain.  Minor scalp laceration not requiring repair.  Movements are coordinated purposeful symmetric.  Cervical spine nontender.  I agree with plan of management.   Charlesetta Shanks, MD 11/14/17 [phone]

## 2017-11-14 NOTE — Discharge Instructions (Signed)
Please take Tylenol (acetaminophen) to relieve your pain.  You may take tylenol, up to 1,000 mg (two extra strength pills).  Do not take more than 3,000 mg tylenol in a 24 hour period.  Please check all medication labels as many medications such as pain and cold medications may contain tylenol. Please do not drink alcohol while taking this medication.   Please schedule an appointment with your primary care provider.  If you develop dizziness, vision changes, worsening headache, numbness, speech difficulties, or have any other concerns please seek additional medical care and evaluation.

## 2017-11-14 NOTE — ED Provider Notes (Signed)
[facility] EMERGENCY DEPARTMENT Provider Note   CSN: [id number] Arrival date & time: 11/14/17  1305     History   Chief Complaint Chief Complaint  Patient presents with  . Fall    HPI [name] is a 72 y.o. male with a past medical history of diverticulitis, CAD, DJD G, DM 2, back pain, who presents today for evaluation after a fall.  He reports that shortly prior to arrival he was trying to climb into his truck when he fell backwards striking his head on the pavement.  No LOC. He reports that the fall was mechanical in nature, denies prodromal symptoms.  Reports that he has chronic left leg weakness which is unchanged and that while putting his leg up until the truck he missed grabbing the steering wheel and his leg gave out.  He reports compliance with his Plavix.  He denies passing out or losing consciousness.  Denies neck pain, chest pain, pain in legs or arms hips, or abdomen.    HPI  Past Medical History:  Diagnosis Date  . Anxiety   . Arthritis   . Chronic back pain    herniated disc  . Coronary artery disease    takes Plavix and ASA daily  . Depression    takes Prozac daily  . Diverticulitis    hx of  . Diverticulitis   . Dizziness    occasionally and states Dr.Smith is aware  . DJD (degenerative joint disease)   . GERD (gastroesophageal reflux disease)    takes an OTC antacid  . Gout    yrs ago and doesn't take any meds  . Hard of hearing    wears hearing aids  . Headache(784.0)    occasionally  . History of colon polyps    benign  . History of kidney stones   . Hyperlipidemia    takes Atorvastatin daily  . Hypertension    takes Amlodipine,Losartan,and Coreg daily  . Hypothyroidism    takes Synthroid daily  . IBS (irritable bowel syndrome)   . Shortness of breath dyspnea    with exertion  . Tuberculosis    Patient denies Tb  . Type II diabetes mellitus (HCC)    takes Metformin daily  . Weakness    numbness and tingling in  left leg    Patient Active Problem List   Diagnosis Date Noted  . HNP (herniated nucleus pulposus), lumbar 06/14/2014  . Atypical chest pain 12/28/2013  . Unstable angina ([place]) 12/27/2013  . 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[place]) 03/14/2013    Class: Chronic  . Hyperlipidemia 03/14/2013  . Chest pain 07/30/2011  . Obesity, unspecified 07/30/2011  . Essential hypertension, benign 07/30/2011  . Diabetes mellitus type 2, controlled, with complications ([place]) 48/54/6270    Past Surgical History:  Procedure Laterality Date  . ANTERIOR CERVICAL DECOMP/DISCECTOMY FUSION  04/2007   C4-5; C6-7  . BACK SURGERY     "3 times; last time was screws in lower back 10/2008"  . CHOLECYSTECTOMY  1970's  . COLONOSCOPY    . CORONARY ANGIOPLASTY  1999/2013   1 stent  . CYSTOSCOPY    . LEFT HEART CATHETERIZATION WITH CORONARY ANGIOGRAM N/A 08/21/2011   Procedure: LEFT HEART CATHETERIZATION WITH CORONARY ANGIOGRAM;  Surgeon: Sinclair Grooms, MD;  Location: [facility] CATH LAB;  Service: Cardiovascular;  Laterality: N/A;  . LITHOTRIPSY    . LUMBAR LAMINECTOMY/DECOMPRESSION MICRODISCECTOMY Left 06/14/2014   Procedure: LEFT LUMBAR THREE-FOUR  [place] AND MICRODISKECTOMY.;  Surgeon: Hosie Spangle, MD;  Location: [facility];  Service: Neurosurgery;  Laterality: Left;  left  . PCI  5/09/2-13   LAD  . RADIOLOGY WITH ANESTHESIA N/A 11/13/2015   Procedure: MRI OF LUMBAR SPINE W/WO CONTRAST    (RADIOLOGY WITH ANESTHESIA);  Surgeon: Medication Radiologist, MD;  Location: [facility];  Service: Radiology;  Laterality: N/A;  . SHOULDER ARTHROSCOPY W/ ROTATOR CUFF REPAIR  ~ 2011   left        Home Medications    Prior to Admission medications   Medication Sig Start Date End Date Taking? Authorizing Provider  acetaminophen (TYLENOL) 500 MG tablet Take 1,000 mg by mouth every 6 (six) hours as needed for mild pain.    Yes [provider]  amLODipine (NORVASC) 10 MG  tablet Take 10 mg by mouth daily.   Yes [provider]  aspirin 81 MG tablet Take 81 mg by mouth daily.   Yes [provider]  atorvastatin (LIPITOR) 20 MG tablet Take 20 mg by mouth daily.   Yes [provider]  carvedilol (COREG) 25 MG tablet Take 25 mg by mouth 2 (two) times daily with a meal.   Yes [provider]  clopidogrel (PLAVIX) 75 MG tablet Take 75 mg by mouth daily with breakfast.   Yes [provider]  hydrochlorothiazide (HYDRODIURIL) 25 MG tablet Take 25 mg by mouth daily.    Yes [provider]  HYDROmorphone (DILAUDID) 2 MG tablet Take 2 mg by mouth every 4 (four) hours as needed for moderate pain or severe pain.  10/06/15  Yes [provider]  levothyroxine (SYNTHROID, LEVOTHROID) 50 MCG tablet Take 50 mcg by mouth daily before breakfast. 02/26/15  Yes [provider]  losartan (COZAAR) 100 MG tablet Take 100 mg by mouth daily.   Yes [provider]  metFORMIN (GLUCOPHAGE-XR) 500 MG 24 hr tablet Take 500 mg by mouth 2 (two) times daily.    Yes [provider]  nitroGLYCERIN (NITROSTAT) 0.4 MG SL tablet Place 0.4 mg under the tongue every 5 (five) minutes as needed. For chest pain   Yes [provider]  sertraline (ZOLOFT) 50 MG tablet Take 50 mg by mouth daily. 11/25/16  Yes [provider]  fexofenadine (ALLEGRA) 180 MG tablet Take 360 mg by mouth every morning. 10/20/17   [provider]    Family History Family History  Problem Relation Age of Onset  . Cancer Father        colon  . Arthritis Mother   . Healthy Sister   . Heart attack Brother   . Heart disease Brother   . Hypertension Brother   . Healthy Sister     Social History Social History   Tobacco Use  . Smoking status: Former Research scientist (life sciences)  . Smokeless tobacco: Current User    Types: Chew  Substance Use Topics  . Alcohol use: No  . Drug use: No     Allergies   Sulfa antibiotics   Review  of Systems Review of Systems  Constitutional: Negative for chills and fever.  HENT: Negative for facial swelling.   Eyes: Negative for pain and visual disturbance.  Respiratory: Negative for chest tightness and shortness of breath.   Cardiovascular: Negative for chest pain and palpitations.  Gastrointestinal: Negative for abdominal pain, diarrhea, nausea and vomiting.  Genitourinary: Negative for dysuria.  Musculoskeletal: Negative for back pain and neck pain.  Skin: Positive for wound.  Neurological: Positive  for weakness (Chronic, left leg, unchanged) and headaches (Posterior, 3/10). Negative for dizziness and numbness.  All other systems reviewed and are negative.    Physical Exam Updated Vital Signs BP (!) 142/70   Pulse 64   Temp 97.7 F (36.5 C) (Oral)   Resp 13   SpO2 95%   Physical Exam  Constitutional: He is oriented to person, place, and time. He appears well-developed and well-nourished. No distress.  HENT:  Head: Normocephalic.  Mouth/Throat: Oropharynx is clear and moist.  Hearing aids present.  There is a 1cm skin tear present on the occiput, no active bleeding or oozing at this time.  When attempt to separate the wound it does not gape open.  Eyes: Pupils are equal, round, and reactive to light. Conjunctivae and EOM are normal.  Neck: Normal range of motion. Neck supple.  Cardiovascular: Normal rate, regular rhythm, normal heart sounds and intact distal pulses.  No murmur heard. Pulmonary/Chest: Effort normal and breath sounds normal. No respiratory distress.  Abdominal: Soft. Bowel sounds are normal. There is no tenderness. There is no guarding.  Musculoskeletal: He exhibits no edema, tenderness or deformity.  Compartments in bilateral arms and legs are soft, easily compressible.  Neurological: He is alert and oriented to person, place, and time.  Mental Status:  Alert, oriented, thought content appropriate, able to give a coherent history. Speech fluent  without evidence of aphasia. Able to follow 2 step commands without difficulty.  Cranial Nerves:  II:  pupils equal, round, reactive to light III,IV, VI: ptosis not present, extra-ocular motions intact bilaterally  V,VII: smile symmetric, facial light touch sensation equal VIII: hearing grossly normal to voice  X: uvula elevates symmetrically  XI: bilateral shoulder shrug symmetric and strong XII: midline tongue extension without fassiculations Motor:  Normal tone. 5/5 in upper and lower extremities bilaterally including strong and equal grip strength and dorsiflexion/plantar flexion CV: distal pulses palpable throughout    Skin: Skin is warm and dry. He is not diaphoretic.  There is a circular approximately 1 cm area of abrasion on patient's right wrist.  Scant oozing  Psychiatric: He has a normal mood and affect.  Nursing note and vitals reviewed.    ED Treatments / Results  Labs (all labs ordered are listed, but only abnormal results are displayed) Labs Reviewed - No data to display  EKG None  Radiology Ct Head Wo Contrast  Result Date: 11/14/2017 CLINICAL DATA:  72 year old male on Plavix status post fall while exiting his truck. EXAM: CT HEAD WITHOUT CONTRAST CT CERVICAL SPINE WITHOUT CONTRAST TECHNIQUE: Multidetector CT imaging of the head and cervical spine was performed following the standard protocol without intravenous contrast. Multiplanar CT image reconstructions of the cervical spine were also generated. COMPARISON:  Prior head CT 08/29/2011 FINDINGS: CT HEAD FINDINGS Brain: No evidence of acute infarction, hemorrhage, hydrocephalus, extra-axial collection or mass lesion/mass effect. Vascular: No hyperdense vessel or unexpected calcification. Skull: Normal. Negative for fracture or focal lesion. Sinuses/Orbits: No acute finding. Other: Mild focal subcutaneous reticulation in the fat overlying the right posterior parietal scalp. Findings are most consistent with small  contusion. No significant hematoma. CT CERVICAL SPINE FINDINGS Alignment: Normal. Skull base and vertebrae: No acute fracture. No primary bone lesion or focal pathologic process. Surgical changes of prior C5-C7 anterior cervical discectomy and fusion. Successful bony ankylosis at both levels. No evidence of hardware complication. Soft tissues and spinal canal: No prevertebral fluid or swelling. No visible canal hematoma. Disc levels:  C5-C7 ACDF. Upper chest:  Negative. Other: None IMPRESSION: CT HEAD 1. No acute intracranial abnormality. 2. Small high right parietal scalp contusion. CT CSPINE 1. No acute fracture or malalignment. 2. Surgical changes of prior C5-C7 ACDF without evidence of complication. Electronically Signed   By: Jacqulynn Cadet M.D.   On: 11/14/2017 15:42   Ct Cervical Spine Wo Contrast  Result Date: 11/14/2017 CLINICAL DATA:  72 year old male on Plavix status post fall while exiting his truck. EXAM: CT HEAD WITHOUT CONTRAST CT CERVICAL SPINE WITHOUT CONTRAST TECHNIQUE: Multidetector CT imaging of the head and cervical spine was performed following the standard protocol without intravenous contrast. Multiplanar CT image reconstructions of the cervical spine were also generated. COMPARISON:  Prior head CT 08/29/2011 FINDINGS: CT HEAD FINDINGS Brain: No evidence of acute infarction, hemorrhage, hydrocephalus, extra-axial collection or mass lesion/mass effect. Vascular: No hyperdense vessel or unexpected calcification. Skull: Normal. Negative for fracture or focal lesion. Sinuses/Orbits: No acute finding. Other: Mild focal subcutaneous reticulation in the fat overlying the right posterior parietal scalp. Findings are most consistent with small contusion. No significant hematoma. CT CERVICAL SPINE FINDINGS Alignment: Normal. Skull base and vertebrae: No acute fracture. No primary bone lesion or focal pathologic process. Surgical changes of prior C5-C7 anterior cervical discectomy and fusion.  Successful bony ankylosis at both levels. No evidence of hardware complication. Soft tissues and spinal canal: No prevertebral fluid or swelling. No visible canal hematoma. Disc levels:  C5-C7 ACDF. Upper chest: Negative. Other: None IMPRESSION: CT HEAD 1. No acute intracranial abnormality. 2. Small high right parietal scalp contusion. CT CSPINE 1. No acute fracture or malalignment. 2. Surgical changes of prior C5-C7 ACDF without evidence of complication. Electronically Signed   By: Jacqulynn Cadet M.D.   On: 11/14/2017 15:42    Procedures Procedures (including critical care time)  Medications Ordered in ED Medications - No data to display   Initial Impression / Assessment and Plan / ED Course  I have reviewed the triage vital signs and the nursing notes.  Pertinent labs & imaging results that were available during my care of the patient were reviewed by me and considered in my medical decision making (see chart for details).  Clinical Course as of Nov 15 1611  Sat Nov 14, 2017  1506 Attempted to see patient, he is using restroom.    [EH]  1510 Patient to CT scan.    [EH]    Clinical Course User Index [EH] Lorin Glass, PA-C   Patient presents today for evaluation after a reported mechanical fall.  He reports slight posterior headache and that he skinned his right wrist.  He is anticoagulated with Plavix.  CT head and neck were obtained and reviewed without significant acute abnormalities.  He is awake, alert, interacting appropriately with good strength in all 4 extremities.  1 cm abrasion to right wrist, 1 cm skin tear to posterior scalp which did not gape open.  Wounds were cleaned and dressed.  This patient was seen as a shared visit with Dr. Vallery Ridge.    Patient was given the option to ask questions, all of which were answered to the best my abilities.  He denied any unaddressed complaints or concerns today and stated he was ready for discharge.  Patient discharged  home.   Final Clinical Impressions(s) / ED Diagnoses   Final diagnoses:  Fall, initial encounter  Contusion of scalp, initial encounter  Abrasion    ED Discharge Orders    None       Lorin Glass,  PA-C 11/14/17 [place], MD 11/16/17 1734

## 2017-11-15 IMAGING — CT CT RENAL STONE PROTOCOL
2 of 4 series · 17 of 46 positions shown, 19 images · non-contrast
Comparison: 11/23/2014

CLINICAL DATA: Left flank pain with nausea. Onset yesterday
morning.

EXAM:
CT ABDOMEN AND PELVIS WITHOUT CONTRAST
TECHNIQUE: Multidetector CT imaging of the abdomen and pelvis was performed
following the standard protocol without IV contrast.

[Series 2: renal stone 5mm · axial · 0.98mm/px · z∈[-1038,-513]mm · 14 of 117 slices shown, 16 images]
[im 6/117  soft-tissue]
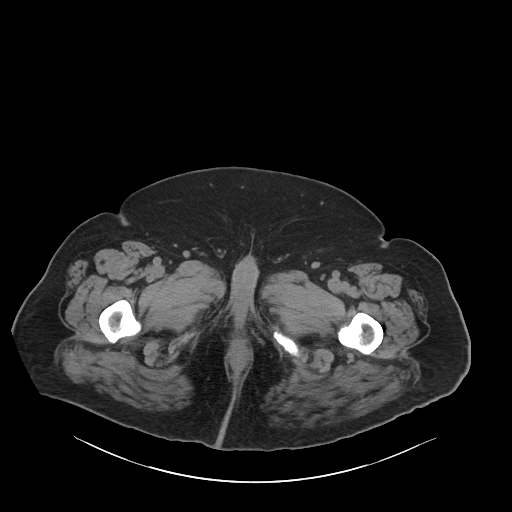
[im 6/117  bone]
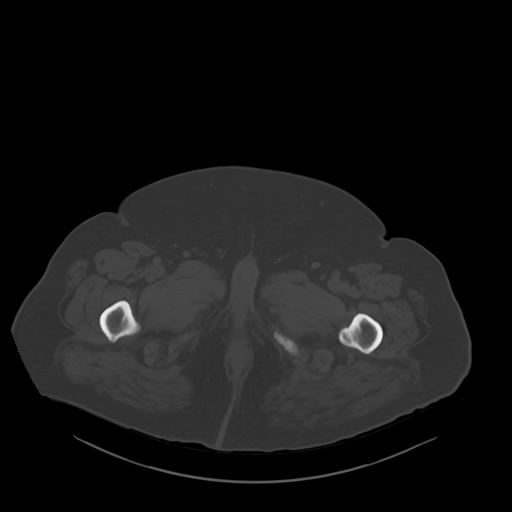
[im 16/117  soft-tissue]
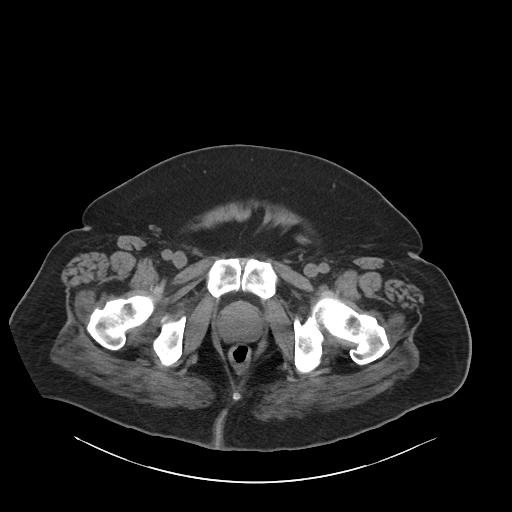
[im 21/117  soft-tissue]
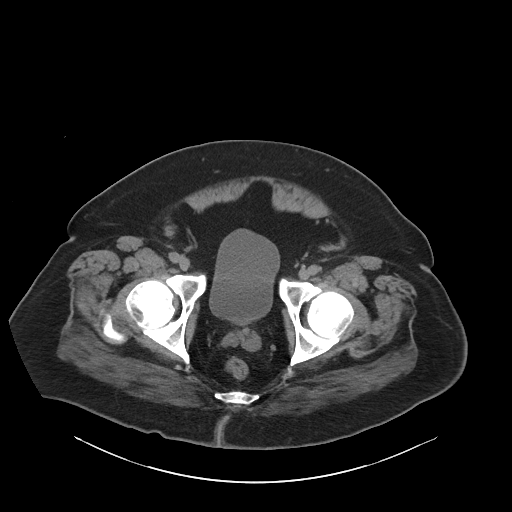
[im 31/117  soft-tissue]
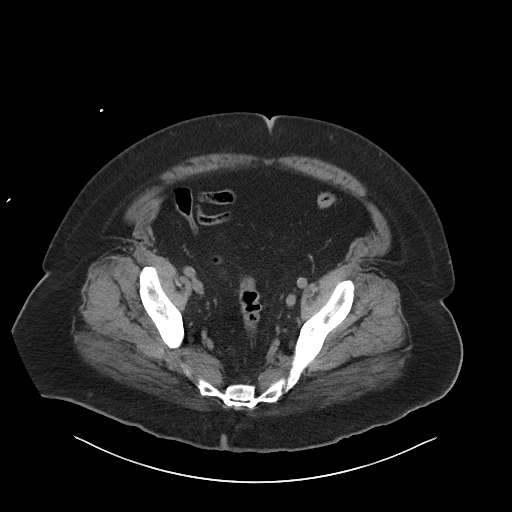
[im 41/117  soft-tissue]
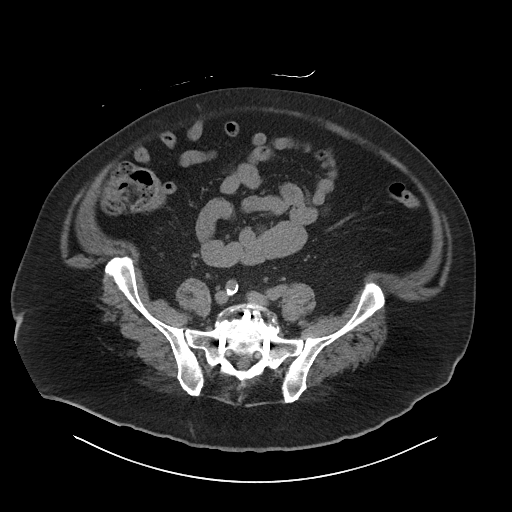
[im 46/117  soft-tissue]
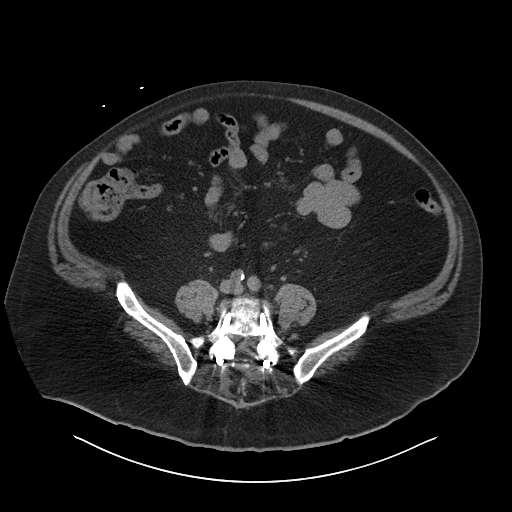
[im 56/117  soft-tissue]
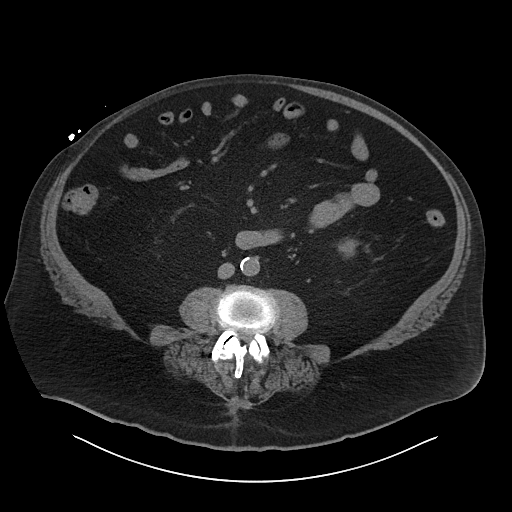
[im 61/117  soft-tissue]
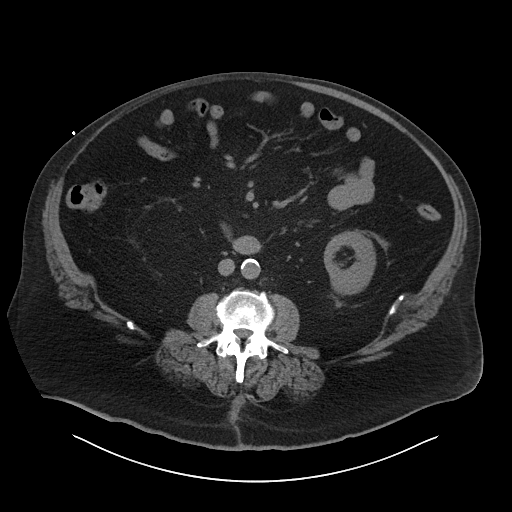
[im 71/117  soft-tissue]
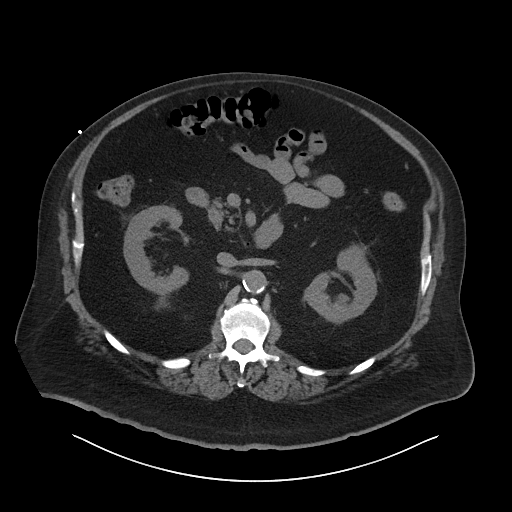
[im 71/117  bone]
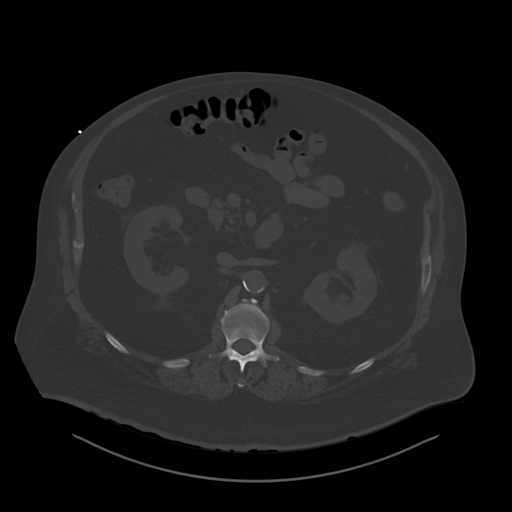
[im 76/117  soft-tissue]
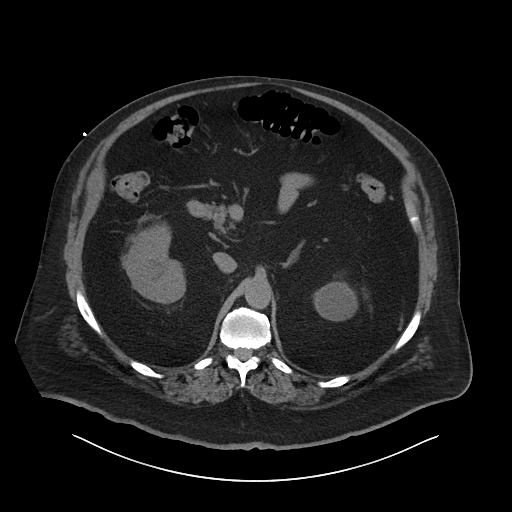
[im 86/117  soft-tissue]
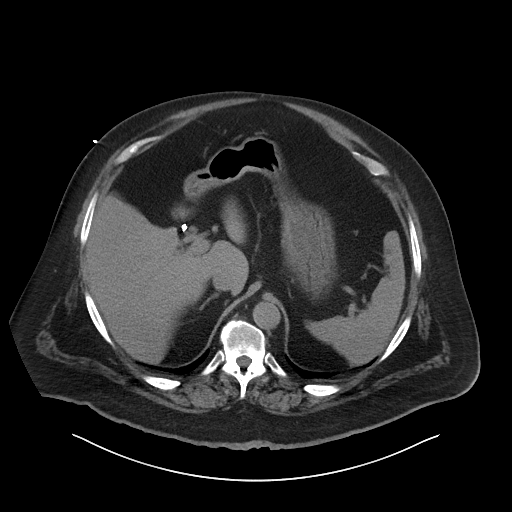
[im 96/117  soft-tissue]
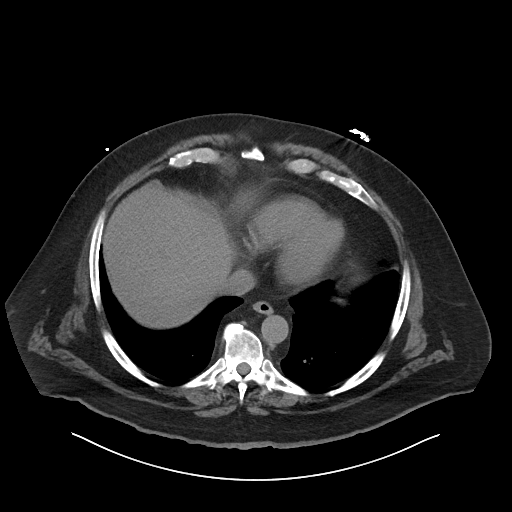
[im 101/117  soft-tissue]
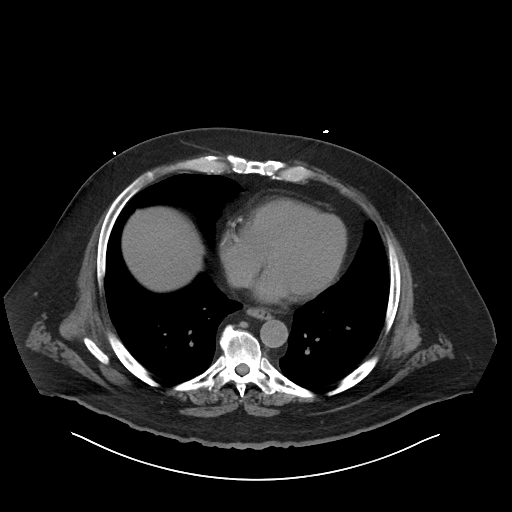
[im 111/117  soft-tissue]
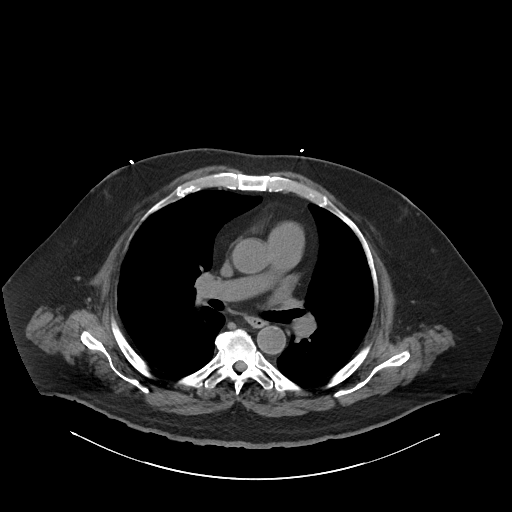

[Series 4: renal stone 3.0 cor · coronal · 0.93mm/px · 3 of 128 slices shown]
[im 43/128  soft-tissue]
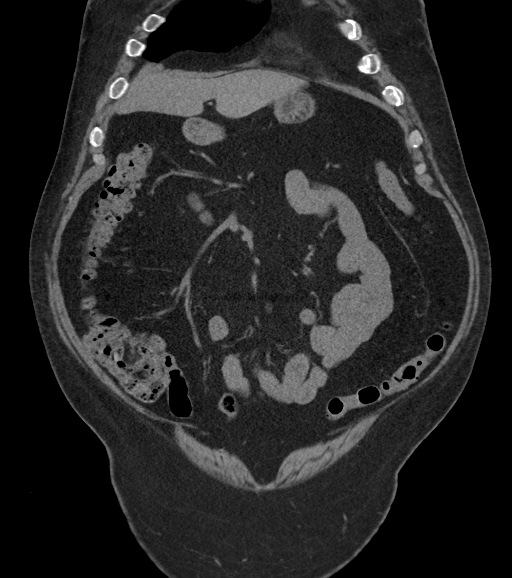
[im 57/128  soft-tissue]
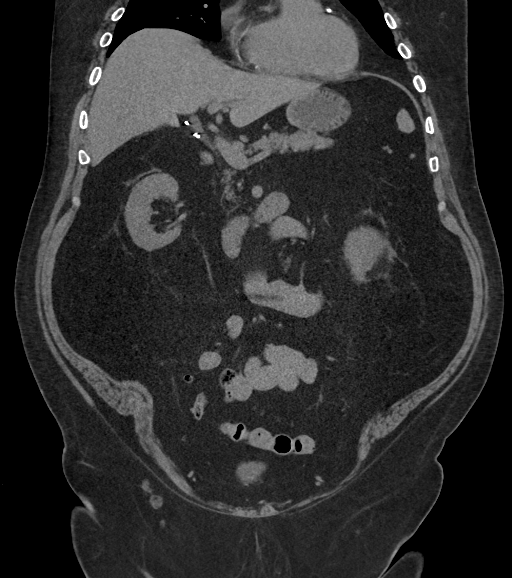
[im 71/128  soft-tissue]
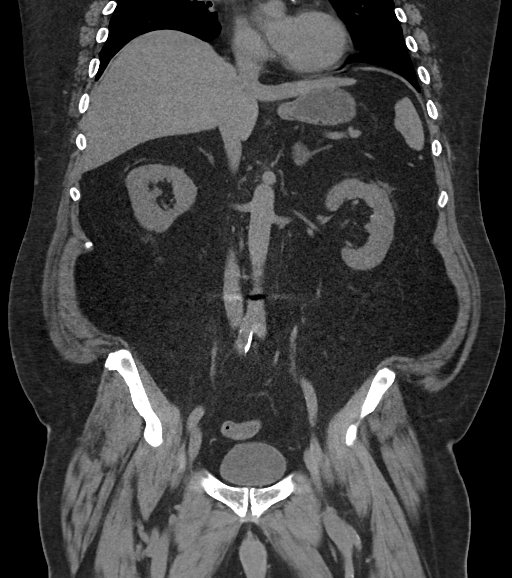

[17 of 46 positions shown; findings below may reference images not displayed]

FINDINGS: There are renal calculi bilaterally, measuring up to 7 mm in the
right upper pole and 5 mm in the left midpole. There are no ureteral
calculi. There is no hydronephrosis or ureteral dilatation. There is
a 3.3 cm cyst at the upper pole the left kidney.

There are unremarkable unenhanced appearances of the liver, bile
ducts, pancreas, spleen and adrenals. There is prior
cholecystectomy. The abdominal aorta is normal in caliber. There is
mild atherosclerotic calcification. There is no adenopathy in the
abdomen or pelvis. There are normal appearances of the stomach,
small bowel and colon. The appendix is normal.

No acute inflammatory changes are evident in the abdomen or pelvis.
There is no ascites. There is no significant abnormality in the
lower chest. There is no significant skeletal lesion. There is
lumbar posterior decompression with instrumented fusion from L4
through S1, utilizing pedicle screws and interconnecting vertical
hardware as well as interbody fusion cages.
IMPRESSION: 1. Bilateral nephrolithiasis.
2. No acute findings in the abdomen or pelvis.

## 2017-12-01 DIAGNOSIS — R1032 Left lower quadrant pain: Secondary | ICD-10-CM | POA: Diagnosis not present

## 2017-12-01 DIAGNOSIS — I2510 Atherosclerotic heart disease of native coronary artery without angina pectoris: Secondary | ICD-10-CM | POA: Diagnosis not present

## 2017-12-01 DIAGNOSIS — E119 Type 2 diabetes mellitus without complications: Secondary | ICD-10-CM | POA: Diagnosis not present

## 2017-12-01 DIAGNOSIS — I10 Essential (primary) hypertension: Secondary | ICD-10-CM | POA: Diagnosis not present

## 2018-01-26 DIAGNOSIS — Z23 Encounter for immunization: Secondary | ICD-10-CM | POA: Diagnosis not present

## 2018-03-01 DIAGNOSIS — E7800 Pure hypercholesterolemia, unspecified: Secondary | ICD-10-CM | POA: Diagnosis not present

## 2018-03-01 DIAGNOSIS — I2510 Atherosclerotic heart disease of native coronary artery without angina pectoris: Secondary | ICD-10-CM | POA: Diagnosis not present

## 2018-03-01 DIAGNOSIS — E119 Type 2 diabetes mellitus without complications: Secondary | ICD-10-CM | POA: Diagnosis not present

## 2018-03-01 DIAGNOSIS — Z79899 Other long term (current) drug therapy: Secondary | ICD-10-CM | POA: Diagnosis not present

## 2018-03-01 DIAGNOSIS — E039 Hypothyroidism, unspecified: Secondary | ICD-10-CM | POA: Diagnosis not present

## 2018-03-01 DIAGNOSIS — R1032 Left lower quadrant pain: Secondary | ICD-10-CM | POA: Diagnosis not present

## 2018-03-01 DIAGNOSIS — I10 Essential (primary) hypertension: Secondary | ICD-10-CM | POA: Diagnosis not present

## 2018-03-02 DIAGNOSIS — L299 Pruritus, unspecified: Secondary | ICD-10-CM | POA: Diagnosis not present

## 2018-03-02 DIAGNOSIS — L300 Nummular dermatitis: Secondary | ICD-10-CM | POA: Diagnosis not present

## 2018-03-02 DIAGNOSIS — D485 Neoplasm of uncertain behavior of skin: Secondary | ICD-10-CM | POA: Diagnosis not present

## 2018-03-19 DIAGNOSIS — M545 Low back pain: Secondary | ICD-10-CM | POA: Diagnosis not present

## 2018-03-19 DIAGNOSIS — R109 Unspecified abdominal pain: Secondary | ICD-10-CM | POA: Diagnosis not present

## 2018-03-19 DIAGNOSIS — N200 Calculus of kidney: Secondary | ICD-10-CM | POA: Diagnosis not present

## 2018-03-19 DIAGNOSIS — R319 Hematuria, unspecified: Secondary | ICD-10-CM | POA: Diagnosis not present

## 2018-03-19 DIAGNOSIS — N281 Cyst of kidney, acquired: Secondary | ICD-10-CM | POA: Diagnosis not present

## 2018-03-22 DIAGNOSIS — R109 Unspecified abdominal pain: Secondary | ICD-10-CM | POA: Diagnosis not present

## 2018-03-22 DIAGNOSIS — R1032 Left lower quadrant pain: Secondary | ICD-10-CM | POA: Diagnosis not present

## 2018-03-22 DIAGNOSIS — R319 Hematuria, unspecified: Secondary | ICD-10-CM | POA: Diagnosis not present

## 2018-03-22 DIAGNOSIS — N200 Calculus of kidney: Secondary | ICD-10-CM | POA: Diagnosis not present

## 2018-03-22 DIAGNOSIS — I700 Atherosclerosis of aorta: Secondary | ICD-10-CM | POA: Diagnosis not present

## 2018-03-22 DIAGNOSIS — N132 Hydronephrosis with renal and ureteral calculous obstruction: Secondary | ICD-10-CM | POA: Diagnosis not present

## 2018-03-25 DIAGNOSIS — Z6837 Body mass index (BMI) 37.0-37.9, adult: Secondary | ICD-10-CM | POA: Diagnosis not present

## 2018-03-25 DIAGNOSIS — R310 Gross hematuria: Secondary | ICD-10-CM | POA: Diagnosis not present

## 2018-03-25 DIAGNOSIS — N200 Calculus of kidney: Secondary | ICD-10-CM | POA: Diagnosis not present

## 2018-03-25 DIAGNOSIS — R1032 Left lower quadrant pain: Secondary | ICD-10-CM | POA: Diagnosis not present

## 2018-04-02 DIAGNOSIS — E039 Hypothyroidism, unspecified: Secondary | ICD-10-CM | POA: Diagnosis not present

## 2018-05-31 DIAGNOSIS — H52223 Regular astigmatism, bilateral: Secondary | ICD-10-CM | POA: Diagnosis not present

## 2018-05-31 DIAGNOSIS — Z9842 Cataract extraction status, left eye: Secondary | ICD-10-CM | POA: Diagnosis not present

## 2018-05-31 DIAGNOSIS — Z7984 Long term (current) use of oral hypoglycemic drugs: Secondary | ICD-10-CM | POA: Diagnosis not present

## 2018-05-31 DIAGNOSIS — H5202 Hypermetropia, left eye: Secondary | ICD-10-CM | POA: Diagnosis not present

## 2018-05-31 DIAGNOSIS — E119 Type 2 diabetes mellitus without complications: Secondary | ICD-10-CM | POA: Diagnosis not present

## 2018-05-31 DIAGNOSIS — I10 Essential (primary) hypertension: Secondary | ICD-10-CM | POA: Diagnosis not present

## 2018-05-31 DIAGNOSIS — H524 Presbyopia: Secondary | ICD-10-CM | POA: Diagnosis not present

## 2018-05-31 DIAGNOSIS — Z9841 Cataract extraction status, right eye: Secondary | ICD-10-CM | POA: Diagnosis not present

## 2018-05-31 DIAGNOSIS — Z961 Presence of intraocular lens: Secondary | ICD-10-CM | POA: Diagnosis not present

## 2018-06-01 DIAGNOSIS — R1032 Left lower quadrant pain: Secondary | ICD-10-CM | POA: Diagnosis not present

## 2018-06-01 DIAGNOSIS — E119 Type 2 diabetes mellitus without complications: Secondary | ICD-10-CM | POA: Diagnosis not present

## 2018-06-01 DIAGNOSIS — F418 Other specified anxiety disorders: Secondary | ICD-10-CM | POA: Diagnosis not present

## 2018-06-01 DIAGNOSIS — M545 Low back pain: Secondary | ICD-10-CM | POA: Diagnosis not present

## 2018-08-04 DIAGNOSIS — M545 Low back pain: Secondary | ICD-10-CM | POA: Diagnosis not present

## 2018-08-04 DIAGNOSIS — F418 Other specified anxiety disorders: Secondary | ICD-10-CM | POA: Diagnosis not present

## 2018-08-04 DIAGNOSIS — E119 Type 2 diabetes mellitus without complications: Secondary | ICD-10-CM | POA: Diagnosis not present

## 2018-08-04 DIAGNOSIS — R1032 Left lower quadrant pain: Secondary | ICD-10-CM | POA: Diagnosis not present

## 2018-08-30 DIAGNOSIS — L300 Nummular dermatitis: Secondary | ICD-10-CM | POA: Diagnosis not present

## 2018-08-30 DIAGNOSIS — L299 Pruritus, unspecified: Secondary | ICD-10-CM | POA: Diagnosis not present

## 2018-08-30 DIAGNOSIS — L853 Xerosis cutis: Secondary | ICD-10-CM | POA: Diagnosis not present

## 2018-09-15 DIAGNOSIS — R1032 Left lower quadrant pain: Secondary | ICD-10-CM | POA: Diagnosis not present

## 2018-09-15 DIAGNOSIS — E119 Type 2 diabetes mellitus without complications: Secondary | ICD-10-CM | POA: Diagnosis not present

## 2018-09-15 DIAGNOSIS — F418 Other specified anxiety disorders: Secondary | ICD-10-CM | POA: Diagnosis not present

## 2018-09-15 DIAGNOSIS — E039 Hypothyroidism, unspecified: Secondary | ICD-10-CM | POA: Diagnosis not present

## 2018-09-15 DIAGNOSIS — Z79899 Other long term (current) drug therapy: Secondary | ICD-10-CM | POA: Diagnosis not present

## 2018-09-15 DIAGNOSIS — M545 Low back pain: Secondary | ICD-10-CM | POA: Diagnosis not present

## 2018-09-15 DIAGNOSIS — Z125 Encounter for screening for malignant neoplasm of prostate: Secondary | ICD-10-CM | POA: Diagnosis not present

## 2018-09-15 DIAGNOSIS — E7800 Pure hypercholesterolemia, unspecified: Secondary | ICD-10-CM | POA: Diagnosis not present

## 2018-10-18 DIAGNOSIS — Z6834 Body mass index (BMI) 34.0-34.9, adult: Secondary | ICD-10-CM | POA: Diagnosis not present

## 2018-10-18 DIAGNOSIS — Z125 Encounter for screening for malignant neoplasm of prostate: Secondary | ICD-10-CM | POA: Diagnosis not present

## 2018-10-18 DIAGNOSIS — Z139 Encounter for screening, unspecified: Secondary | ICD-10-CM | POA: Diagnosis not present

## 2018-10-18 DIAGNOSIS — E785 Hyperlipidemia, unspecified: Secondary | ICD-10-CM | POA: Diagnosis not present

## 2018-10-18 DIAGNOSIS — Z1331 Encounter for screening for depression: Secondary | ICD-10-CM | POA: Diagnosis not present

## 2018-10-18 DIAGNOSIS — Z0000 Encounter for general adult medical examination without abnormal findings: Secondary | ICD-10-CM | POA: Diagnosis not present

## 2018-10-18 DIAGNOSIS — Z9181 History of falling: Secondary | ICD-10-CM | POA: Diagnosis not present

## 2018-12-16 DIAGNOSIS — R600 Localized edema: Secondary | ICD-10-CM | POA: Diagnosis not present

## 2018-12-16 DIAGNOSIS — E119 Type 2 diabetes mellitus without complications: Secondary | ICD-10-CM | POA: Diagnosis not present

## 2018-12-16 DIAGNOSIS — R1032 Left lower quadrant pain: Secondary | ICD-10-CM | POA: Diagnosis not present

## 2018-12-16 DIAGNOSIS — M545 Low back pain: Secondary | ICD-10-CM | POA: Diagnosis not present

## 2018-12-23 DIAGNOSIS — M6281 Muscle weakness (generalized): Secondary | ICD-10-CM | POA: Diagnosis not present

## 2018-12-23 DIAGNOSIS — M2560 Stiffness of unspecified joint, not elsewhere classified: Secondary | ICD-10-CM | POA: Diagnosis not present

## 2018-12-23 DIAGNOSIS — R262 Difficulty in walking, not elsewhere classified: Secondary | ICD-10-CM | POA: Diagnosis not present

## 2018-12-23 DIAGNOSIS — M4726 Other spondylosis with radiculopathy, lumbar region: Secondary | ICD-10-CM | POA: Diagnosis not present

## 2018-12-29 DIAGNOSIS — M4726 Other spondylosis with radiculopathy, lumbar region: Secondary | ICD-10-CM | POA: Diagnosis not present

## 2018-12-29 DIAGNOSIS — R262 Difficulty in walking, not elsewhere classified: Secondary | ICD-10-CM | POA: Diagnosis not present

## 2018-12-31 DIAGNOSIS — R262 Difficulty in walking, not elsewhere classified: Secondary | ICD-10-CM | POA: Diagnosis not present

## 2018-12-31 DIAGNOSIS — M4726 Other spondylosis with radiculopathy, lumbar region: Secondary | ICD-10-CM | POA: Diagnosis not present

## 2019-01-05 DIAGNOSIS — R262 Difficulty in walking, not elsewhere classified: Secondary | ICD-10-CM | POA: Diagnosis not present

## 2019-01-05 DIAGNOSIS — M4726 Other spondylosis with radiculopathy, lumbar region: Secondary | ICD-10-CM | POA: Diagnosis not present

## 2019-01-11 DIAGNOSIS — R262 Difficulty in walking, not elsewhere classified: Secondary | ICD-10-CM | POA: Diagnosis not present

## 2019-01-11 DIAGNOSIS — M4726 Other spondylosis with radiculopathy, lumbar region: Secondary | ICD-10-CM | POA: Diagnosis not present

## 2019-01-13 DIAGNOSIS — R262 Difficulty in walking, not elsewhere classified: Secondary | ICD-10-CM | POA: Diagnosis not present

## 2019-01-13 DIAGNOSIS — M4726 Other spondylosis with radiculopathy, lumbar region: Secondary | ICD-10-CM | POA: Diagnosis not present

## 2019-01-17 DIAGNOSIS — Z7409 Other reduced mobility: Secondary | ICD-10-CM | POA: Diagnosis not present

## 2019-01-17 DIAGNOSIS — I2510 Atherosclerotic heart disease of native coronary artery without angina pectoris: Secondary | ICD-10-CM | POA: Diagnosis not present

## 2019-01-17 DIAGNOSIS — M545 Low back pain: Secondary | ICD-10-CM | POA: Diagnosis not present

## 2019-01-17 DIAGNOSIS — F418 Other specified anxiety disorders: Secondary | ICD-10-CM | POA: Diagnosis not present

## 2019-01-19 DIAGNOSIS — E669 Obesity, unspecified: Secondary | ICD-10-CM | POA: Diagnosis not present

## 2019-01-19 DIAGNOSIS — M25512 Pain in left shoulder: Secondary | ICD-10-CM | POA: Diagnosis not present

## 2019-01-19 DIAGNOSIS — M5416 Radiculopathy, lumbar region: Secondary | ICD-10-CM | POA: Diagnosis not present

## 2019-01-19 DIAGNOSIS — R296 Repeated falls: Secondary | ICD-10-CM | POA: Diagnosis not present

## 2019-01-19 DIAGNOSIS — Z7409 Other reduced mobility: Secondary | ICD-10-CM | POA: Diagnosis not present

## 2019-01-20 DIAGNOSIS — M5020 Other cervical disc displacement, unspecified cervical region: Secondary | ICD-10-CM | POA: Diagnosis not present

## 2019-01-20 DIAGNOSIS — M25511 Pain in right shoulder: Secondary | ICD-10-CM | POA: Diagnosis not present

## 2019-01-20 DIAGNOSIS — E559 Vitamin D deficiency, unspecified: Secondary | ICD-10-CM | POA: Diagnosis not present

## 2019-01-20 DIAGNOSIS — K5790 Diverticulosis of intestine, part unspecified, without perforation or abscess without bleeding: Secondary | ICD-10-CM | POA: Diagnosis not present

## 2019-01-20 DIAGNOSIS — E7800 Pure hypercholesterolemia, unspecified: Secondary | ICD-10-CM | POA: Diagnosis not present

## 2019-01-20 DIAGNOSIS — Z6836 Body mass index (BMI) 36.0-36.9, adult: Secondary | ICD-10-CM | POA: Diagnosis not present

## 2019-01-20 DIAGNOSIS — M25512 Pain in left shoulder: Secondary | ICD-10-CM | POA: Diagnosis not present

## 2019-01-20 DIAGNOSIS — Z9181 History of falling: Secondary | ICD-10-CM | POA: Diagnosis not present

## 2019-01-20 DIAGNOSIS — R296 Repeated falls: Secondary | ICD-10-CM | POA: Diagnosis not present

## 2019-01-20 DIAGNOSIS — S46011A Strain of muscle(s) and tendon(s) of the rotator cuff of right shoulder, initial encounter: Secondary | ICD-10-CM | POA: Diagnosis not present

## 2019-01-20 DIAGNOSIS — Z7984 Long term (current) use of oral hypoglycemic drugs: Secondary | ICD-10-CM | POA: Diagnosis not present

## 2019-01-20 DIAGNOSIS — Z87891 Personal history of nicotine dependence: Secondary | ICD-10-CM | POA: Diagnosis not present

## 2019-01-20 DIAGNOSIS — K5909 Other constipation: Secondary | ICD-10-CM | POA: Diagnosis not present

## 2019-01-20 DIAGNOSIS — F418 Other specified anxiety disorders: Secondary | ICD-10-CM | POA: Diagnosis not present

## 2019-01-20 DIAGNOSIS — I10 Essential (primary) hypertension: Secondary | ICD-10-CM | POA: Diagnosis not present

## 2019-01-20 DIAGNOSIS — K589 Irritable bowel syndrome without diarrhea: Secondary | ICD-10-CM | POA: Diagnosis not present

## 2019-01-20 DIAGNOSIS — Z7982 Long term (current) use of aspirin: Secondary | ICD-10-CM | POA: Diagnosis not present

## 2019-01-20 DIAGNOSIS — M109 Gout, unspecified: Secondary | ICD-10-CM | POA: Diagnosis not present

## 2019-01-20 DIAGNOSIS — K219 Gastro-esophageal reflux disease without esophagitis: Secondary | ICD-10-CM | POA: Diagnosis not present

## 2019-01-20 DIAGNOSIS — E119 Type 2 diabetes mellitus without complications: Secondary | ICD-10-CM | POA: Diagnosis not present

## 2019-01-20 DIAGNOSIS — Z7902 Long term (current) use of antithrombotics/antiplatelets: Secondary | ICD-10-CM | POA: Diagnosis not present

## 2019-01-20 DIAGNOSIS — M5416 Radiculopathy, lumbar region: Secondary | ICD-10-CM | POA: Diagnosis not present

## 2019-01-20 DIAGNOSIS — E039 Hypothyroidism, unspecified: Secondary | ICD-10-CM | POA: Diagnosis not present

## 2019-01-20 DIAGNOSIS — G8929 Other chronic pain: Secondary | ICD-10-CM | POA: Diagnosis not present

## 2019-01-20 DIAGNOSIS — E669 Obesity, unspecified: Secondary | ICD-10-CM | POA: Diagnosis not present

## 2019-01-21 DIAGNOSIS — Z7982 Long term (current) use of aspirin: Secondary | ICD-10-CM | POA: Diagnosis not present

## 2019-01-21 DIAGNOSIS — I10 Essential (primary) hypertension: Secondary | ICD-10-CM | POA: Diagnosis not present

## 2019-01-21 DIAGNOSIS — Z7984 Long term (current) use of oral hypoglycemic drugs: Secondary | ICD-10-CM | POA: Diagnosis not present

## 2019-01-21 DIAGNOSIS — E119 Type 2 diabetes mellitus without complications: Secondary | ICD-10-CM | POA: Diagnosis not present

## 2019-01-21 DIAGNOSIS — Z7902 Long term (current) use of antithrombotics/antiplatelets: Secondary | ICD-10-CM | POA: Diagnosis not present

## 2019-01-21 DIAGNOSIS — M5416 Radiculopathy, lumbar region: Secondary | ICD-10-CM | POA: Diagnosis not present

## 2019-01-26 DIAGNOSIS — Z7982 Long term (current) use of aspirin: Secondary | ICD-10-CM | POA: Diagnosis not present

## 2019-01-26 DIAGNOSIS — S46012A Strain of muscle(s) and tendon(s) of the rotator cuff of left shoulder, initial encounter: Secondary | ICD-10-CM | POA: Diagnosis not present

## 2019-01-26 DIAGNOSIS — E119 Type 2 diabetes mellitus without complications: Secondary | ICD-10-CM | POA: Diagnosis not present

## 2019-01-26 DIAGNOSIS — M5416 Radiculopathy, lumbar region: Secondary | ICD-10-CM | POA: Diagnosis not present

## 2019-01-26 DIAGNOSIS — Z7902 Long term (current) use of antithrombotics/antiplatelets: Secondary | ICD-10-CM | POA: Diagnosis not present

## 2019-01-26 DIAGNOSIS — I10 Essential (primary) hypertension: Secondary | ICD-10-CM | POA: Diagnosis not present

## 2019-01-26 DIAGNOSIS — M62512 Muscle wasting and atrophy, not elsewhere classified, left shoulder: Secondary | ICD-10-CM | POA: Diagnosis not present

## 2019-01-26 DIAGNOSIS — S46112A Strain of muscle, fascia and tendon of long head of biceps, left arm, initial encounter: Secondary | ICD-10-CM | POA: Diagnosis not present

## 2019-01-26 DIAGNOSIS — S43082A Other subluxation of left shoulder joint, initial encounter: Secondary | ICD-10-CM | POA: Diagnosis not present

## 2019-01-26 DIAGNOSIS — Z7984 Long term (current) use of oral hypoglycemic drugs: Secondary | ICD-10-CM | POA: Diagnosis not present

## 2019-01-26 DIAGNOSIS — M25412 Effusion, left shoulder: Secondary | ICD-10-CM | POA: Diagnosis not present

## 2019-01-28 DIAGNOSIS — Z7982 Long term (current) use of aspirin: Secondary | ICD-10-CM | POA: Diagnosis not present

## 2019-01-28 DIAGNOSIS — E119 Type 2 diabetes mellitus without complications: Secondary | ICD-10-CM | POA: Diagnosis not present

## 2019-01-28 DIAGNOSIS — Z7984 Long term (current) use of oral hypoglycemic drugs: Secondary | ICD-10-CM | POA: Diagnosis not present

## 2019-01-28 DIAGNOSIS — I10 Essential (primary) hypertension: Secondary | ICD-10-CM | POA: Diagnosis not present

## 2019-01-28 DIAGNOSIS — M5416 Radiculopathy, lumbar region: Secondary | ICD-10-CM | POA: Diagnosis not present

## 2019-01-28 DIAGNOSIS — Z7902 Long term (current) use of antithrombotics/antiplatelets: Secondary | ICD-10-CM | POA: Diagnosis not present

## 2019-01-31 DIAGNOSIS — M5416 Radiculopathy, lumbar region: Secondary | ICD-10-CM | POA: Diagnosis not present

## 2019-01-31 DIAGNOSIS — Z7984 Long term (current) use of oral hypoglycemic drugs: Secondary | ICD-10-CM | POA: Diagnosis not present

## 2019-01-31 DIAGNOSIS — Z7902 Long term (current) use of antithrombotics/antiplatelets: Secondary | ICD-10-CM | POA: Diagnosis not present

## 2019-01-31 DIAGNOSIS — Z7982 Long term (current) use of aspirin: Secondary | ICD-10-CM | POA: Diagnosis not present

## 2019-01-31 DIAGNOSIS — E119 Type 2 diabetes mellitus without complications: Secondary | ICD-10-CM | POA: Diagnosis not present

## 2019-01-31 DIAGNOSIS — I10 Essential (primary) hypertension: Secondary | ICD-10-CM | POA: Diagnosis not present

## 2019-02-02 DIAGNOSIS — M5416 Radiculopathy, lumbar region: Secondary | ICD-10-CM | POA: Diagnosis not present

## 2019-02-02 DIAGNOSIS — Z7982 Long term (current) use of aspirin: Secondary | ICD-10-CM | POA: Diagnosis not present

## 2019-02-02 DIAGNOSIS — I10 Essential (primary) hypertension: Secondary | ICD-10-CM | POA: Diagnosis not present

## 2019-02-02 DIAGNOSIS — Z7984 Long term (current) use of oral hypoglycemic drugs: Secondary | ICD-10-CM | POA: Diagnosis not present

## 2019-02-02 DIAGNOSIS — S46012D Strain of muscle(s) and tendon(s) of the rotator cuff of left shoulder, subsequent encounter: Secondary | ICD-10-CM | POA: Diagnosis not present

## 2019-02-02 DIAGNOSIS — E119 Type 2 diabetes mellitus without complications: Secondary | ICD-10-CM | POA: Diagnosis not present

## 2019-02-02 DIAGNOSIS — Z7902 Long term (current) use of antithrombotics/antiplatelets: Secondary | ICD-10-CM | POA: Diagnosis not present

## 2019-02-03 DIAGNOSIS — Z7984 Long term (current) use of oral hypoglycemic drugs: Secondary | ICD-10-CM | POA: Diagnosis not present

## 2019-02-03 DIAGNOSIS — Z7902 Long term (current) use of antithrombotics/antiplatelets: Secondary | ICD-10-CM | POA: Diagnosis not present

## 2019-02-03 DIAGNOSIS — M5416 Radiculopathy, lumbar region: Secondary | ICD-10-CM | POA: Diagnosis not present

## 2019-02-03 DIAGNOSIS — E119 Type 2 diabetes mellitus without complications: Secondary | ICD-10-CM | POA: Diagnosis not present

## 2019-02-03 DIAGNOSIS — Z7982 Long term (current) use of aspirin: Secondary | ICD-10-CM | POA: Diagnosis not present

## 2019-02-03 DIAGNOSIS — I10 Essential (primary) hypertension: Secondary | ICD-10-CM | POA: Diagnosis not present

## 2019-02-09 DIAGNOSIS — Z7984 Long term (current) use of oral hypoglycemic drugs: Secondary | ICD-10-CM | POA: Diagnosis not present

## 2019-02-09 DIAGNOSIS — Z7982 Long term (current) use of aspirin: Secondary | ICD-10-CM | POA: Diagnosis not present

## 2019-02-09 DIAGNOSIS — Z7902 Long term (current) use of antithrombotics/antiplatelets: Secondary | ICD-10-CM | POA: Diagnosis not present

## 2019-02-09 DIAGNOSIS — I10 Essential (primary) hypertension: Secondary | ICD-10-CM | POA: Diagnosis not present

## 2019-02-09 DIAGNOSIS — E119 Type 2 diabetes mellitus without complications: Secondary | ICD-10-CM | POA: Diagnosis not present

## 2019-02-09 DIAGNOSIS — M5416 Radiculopathy, lumbar region: Secondary | ICD-10-CM | POA: Diagnosis not present

## 2019-02-11 DIAGNOSIS — Z7902 Long term (current) use of antithrombotics/antiplatelets: Secondary | ICD-10-CM | POA: Diagnosis not present

## 2019-02-11 DIAGNOSIS — E119 Type 2 diabetes mellitus without complications: Secondary | ICD-10-CM | POA: Diagnosis not present

## 2019-02-11 DIAGNOSIS — Z7982 Long term (current) use of aspirin: Secondary | ICD-10-CM | POA: Diagnosis not present

## 2019-02-11 DIAGNOSIS — Z7984 Long term (current) use of oral hypoglycemic drugs: Secondary | ICD-10-CM | POA: Diagnosis not present

## 2019-02-11 DIAGNOSIS — M5416 Radiculopathy, lumbar region: Secondary | ICD-10-CM | POA: Diagnosis not present

## 2019-02-11 DIAGNOSIS — I10 Essential (primary) hypertension: Secondary | ICD-10-CM | POA: Diagnosis not present

## 2019-02-14 DIAGNOSIS — M5416 Radiculopathy, lumbar region: Secondary | ICD-10-CM | POA: Diagnosis not present

## 2019-02-14 DIAGNOSIS — Z7984 Long term (current) use of oral hypoglycemic drugs: Secondary | ICD-10-CM | POA: Diagnosis not present

## 2019-02-14 DIAGNOSIS — I10 Essential (primary) hypertension: Secondary | ICD-10-CM | POA: Diagnosis not present

## 2019-02-14 DIAGNOSIS — E119 Type 2 diabetes mellitus without complications: Secondary | ICD-10-CM | POA: Diagnosis not present

## 2019-02-14 DIAGNOSIS — Z7982 Long term (current) use of aspirin: Secondary | ICD-10-CM | POA: Diagnosis not present

## 2019-02-14 DIAGNOSIS — Z7902 Long term (current) use of antithrombotics/antiplatelets: Secondary | ICD-10-CM | POA: Diagnosis not present

## 2019-02-15 DIAGNOSIS — E119 Type 2 diabetes mellitus without complications: Secondary | ICD-10-CM | POA: Diagnosis not present

## 2019-02-15 DIAGNOSIS — Z7984 Long term (current) use of oral hypoglycemic drugs: Secondary | ICD-10-CM | POA: Diagnosis not present

## 2019-02-15 DIAGNOSIS — Z7982 Long term (current) use of aspirin: Secondary | ICD-10-CM | POA: Diagnosis not present

## 2019-02-15 DIAGNOSIS — M5416 Radiculopathy, lumbar region: Secondary | ICD-10-CM | POA: Diagnosis not present

## 2019-02-15 DIAGNOSIS — Z7902 Long term (current) use of antithrombotics/antiplatelets: Secondary | ICD-10-CM | POA: Diagnosis not present

## 2019-02-15 DIAGNOSIS — I10 Essential (primary) hypertension: Secondary | ICD-10-CM | POA: Diagnosis not present

## 2019-02-16 DIAGNOSIS — M5416 Radiculopathy, lumbar region: Secondary | ICD-10-CM | POA: Diagnosis not present

## 2019-02-16 DIAGNOSIS — I10 Essential (primary) hypertension: Secondary | ICD-10-CM | POA: Diagnosis not present

## 2019-02-16 DIAGNOSIS — Z7984 Long term (current) use of oral hypoglycemic drugs: Secondary | ICD-10-CM | POA: Diagnosis not present

## 2019-02-16 DIAGNOSIS — Z7902 Long term (current) use of antithrombotics/antiplatelets: Secondary | ICD-10-CM | POA: Diagnosis not present

## 2019-02-16 DIAGNOSIS — Z7982 Long term (current) use of aspirin: Secondary | ICD-10-CM | POA: Diagnosis not present

## 2019-02-16 DIAGNOSIS — E119 Type 2 diabetes mellitus without complications: Secondary | ICD-10-CM | POA: Diagnosis not present

## 2019-02-17 DIAGNOSIS — M5416 Radiculopathy, lumbar region: Secondary | ICD-10-CM | POA: Diagnosis not present

## 2019-02-17 DIAGNOSIS — I2510 Atherosclerotic heart disease of native coronary artery without angina pectoris: Secondary | ICD-10-CM | POA: Diagnosis not present

## 2019-02-17 DIAGNOSIS — Z23 Encounter for immunization: Secondary | ICD-10-CM | POA: Diagnosis not present

## 2019-02-17 DIAGNOSIS — E119 Type 2 diabetes mellitus without complications: Secondary | ICD-10-CM | POA: Diagnosis not present

## 2019-02-17 DIAGNOSIS — M75122 Complete rotator cuff tear or rupture of left shoulder, not specified as traumatic: Secondary | ICD-10-CM | POA: Diagnosis not present

## 2019-02-18 DIAGNOSIS — I10 Essential (primary) hypertension: Secondary | ICD-10-CM | POA: Diagnosis not present

## 2019-02-18 DIAGNOSIS — Z7902 Long term (current) use of antithrombotics/antiplatelets: Secondary | ICD-10-CM | POA: Diagnosis not present

## 2019-02-18 DIAGNOSIS — Z7984 Long term (current) use of oral hypoglycemic drugs: Secondary | ICD-10-CM | POA: Diagnosis not present

## 2019-02-18 DIAGNOSIS — M5416 Radiculopathy, lumbar region: Secondary | ICD-10-CM | POA: Diagnosis not present

## 2019-02-18 DIAGNOSIS — Z7982 Long term (current) use of aspirin: Secondary | ICD-10-CM | POA: Diagnosis not present

## 2019-02-18 DIAGNOSIS — E119 Type 2 diabetes mellitus without complications: Secondary | ICD-10-CM | POA: Diagnosis not present

## 2019-02-19 DIAGNOSIS — M25511 Pain in right shoulder: Secondary | ICD-10-CM | POA: Diagnosis not present

## 2019-02-19 DIAGNOSIS — Z7902 Long term (current) use of antithrombotics/antiplatelets: Secondary | ICD-10-CM | POA: Diagnosis not present

## 2019-02-19 DIAGNOSIS — E7800 Pure hypercholesterolemia, unspecified: Secondary | ICD-10-CM | POA: Diagnosis not present

## 2019-02-19 DIAGNOSIS — F418 Other specified anxiety disorders: Secondary | ICD-10-CM | POA: Diagnosis not present

## 2019-02-19 DIAGNOSIS — Z87891 Personal history of nicotine dependence: Secondary | ICD-10-CM | POA: Diagnosis not present

## 2019-02-19 DIAGNOSIS — Z7982 Long term (current) use of aspirin: Secondary | ICD-10-CM | POA: Diagnosis not present

## 2019-02-19 DIAGNOSIS — I10 Essential (primary) hypertension: Secondary | ICD-10-CM | POA: Diagnosis not present

## 2019-02-19 DIAGNOSIS — Z6836 Body mass index (BMI) 36.0-36.9, adult: Secondary | ICD-10-CM | POA: Diagnosis not present

## 2019-02-19 DIAGNOSIS — M5020 Other cervical disc displacement, unspecified cervical region: Secondary | ICD-10-CM | POA: Diagnosis not present

## 2019-02-19 DIAGNOSIS — R296 Repeated falls: Secondary | ICD-10-CM | POA: Diagnosis not present

## 2019-02-19 DIAGNOSIS — K589 Irritable bowel syndrome without diarrhea: Secondary | ICD-10-CM | POA: Diagnosis not present

## 2019-02-19 DIAGNOSIS — G8929 Other chronic pain: Secondary | ICD-10-CM | POA: Diagnosis not present

## 2019-02-19 DIAGNOSIS — Z9181 History of falling: Secondary | ICD-10-CM | POA: Diagnosis not present

## 2019-02-19 DIAGNOSIS — M25512 Pain in left shoulder: Secondary | ICD-10-CM | POA: Diagnosis not present

## 2019-02-19 DIAGNOSIS — Z7984 Long term (current) use of oral hypoglycemic drugs: Secondary | ICD-10-CM | POA: Diagnosis not present

## 2019-02-19 DIAGNOSIS — K5909 Other constipation: Secondary | ICD-10-CM | POA: Diagnosis not present

## 2019-02-19 DIAGNOSIS — E119 Type 2 diabetes mellitus without complications: Secondary | ICD-10-CM | POA: Diagnosis not present

## 2019-02-19 DIAGNOSIS — E669 Obesity, unspecified: Secondary | ICD-10-CM | POA: Diagnosis not present

## 2019-02-19 DIAGNOSIS — K219 Gastro-esophageal reflux disease without esophagitis: Secondary | ICD-10-CM | POA: Diagnosis not present

## 2019-02-19 DIAGNOSIS — M109 Gout, unspecified: Secondary | ICD-10-CM | POA: Diagnosis not present

## 2019-02-19 DIAGNOSIS — M5416 Radiculopathy, lumbar region: Secondary | ICD-10-CM | POA: Diagnosis not present

## 2019-02-19 DIAGNOSIS — E039 Hypothyroidism, unspecified: Secondary | ICD-10-CM | POA: Diagnosis not present

## 2019-02-19 DIAGNOSIS — E559 Vitamin D deficiency, unspecified: Secondary | ICD-10-CM | POA: Diagnosis not present

## 2019-02-19 DIAGNOSIS — K5790 Diverticulosis of intestine, part unspecified, without perforation or abscess without bleeding: Secondary | ICD-10-CM | POA: Diagnosis not present

## 2019-02-21 DIAGNOSIS — Z7982 Long term (current) use of aspirin: Secondary | ICD-10-CM | POA: Diagnosis not present

## 2019-02-21 DIAGNOSIS — Z7902 Long term (current) use of antithrombotics/antiplatelets: Secondary | ICD-10-CM | POA: Diagnosis not present

## 2019-02-21 DIAGNOSIS — E119 Type 2 diabetes mellitus without complications: Secondary | ICD-10-CM | POA: Diagnosis not present

## 2019-02-21 DIAGNOSIS — I10 Essential (primary) hypertension: Secondary | ICD-10-CM | POA: Diagnosis not present

## 2019-02-21 DIAGNOSIS — M5416 Radiculopathy, lumbar region: Secondary | ICD-10-CM | POA: Diagnosis not present

## 2019-02-21 DIAGNOSIS — Z7984 Long term (current) use of oral hypoglycemic drugs: Secondary | ICD-10-CM | POA: Diagnosis not present

## 2019-03-01 DIAGNOSIS — Z7982 Long term (current) use of aspirin: Secondary | ICD-10-CM | POA: Diagnosis not present

## 2019-03-01 DIAGNOSIS — Z7984 Long term (current) use of oral hypoglycemic drugs: Secondary | ICD-10-CM | POA: Diagnosis not present

## 2019-03-01 DIAGNOSIS — I10 Essential (primary) hypertension: Secondary | ICD-10-CM | POA: Diagnosis not present

## 2019-03-01 DIAGNOSIS — Z7902 Long term (current) use of antithrombotics/antiplatelets: Secondary | ICD-10-CM | POA: Diagnosis not present

## 2019-03-01 DIAGNOSIS — M5416 Radiculopathy, lumbar region: Secondary | ICD-10-CM | POA: Diagnosis not present

## 2019-03-01 DIAGNOSIS — E119 Type 2 diabetes mellitus without complications: Secondary | ICD-10-CM | POA: Diagnosis not present

## 2019-03-02 DIAGNOSIS — Z7982 Long term (current) use of aspirin: Secondary | ICD-10-CM | POA: Diagnosis not present

## 2019-03-02 DIAGNOSIS — M5416 Radiculopathy, lumbar region: Secondary | ICD-10-CM | POA: Diagnosis not present

## 2019-03-02 DIAGNOSIS — Z7902 Long term (current) use of antithrombotics/antiplatelets: Secondary | ICD-10-CM | POA: Diagnosis not present

## 2019-03-02 DIAGNOSIS — E119 Type 2 diabetes mellitus without complications: Secondary | ICD-10-CM | POA: Diagnosis not present

## 2019-03-02 DIAGNOSIS — I10 Essential (primary) hypertension: Secondary | ICD-10-CM | POA: Diagnosis not present

## 2019-03-02 DIAGNOSIS — Z7984 Long term (current) use of oral hypoglycemic drugs: Secondary | ICD-10-CM | POA: Diagnosis not present

## 2019-03-03 ENCOUNTER — Encounter: Payer: Self-pay | Admitting: *Deleted

## 2019-03-03 ENCOUNTER — Other Ambulatory Visit: Payer: Self-pay

## 2019-03-03 ENCOUNTER — Ambulatory Visit (INDEPENDENT_AMBULATORY_CARE_PROVIDER_SITE_OTHER): Payer: Medicare Other | Admitting: Cardiology

## 2019-03-03 VITALS — BP 114/60 | HR 63 | Ht 69.0 in | Wt 240.0 lb

## 2019-03-03 DIAGNOSIS — E782 Mixed hyperlipidemia: Secondary | ICD-10-CM

## 2019-03-03 DIAGNOSIS — I2510 Atherosclerotic heart disease of native coronary artery without angina pectoris: Secondary | ICD-10-CM | POA: Diagnosis not present

## 2019-03-03 DIAGNOSIS — I10 Essential (primary) hypertension: Secondary | ICD-10-CM | POA: Diagnosis not present

## 2019-03-03 NOTE — Patient Instructions (Signed)
Medication Instructions:  Your physician recommends that you continue on your current medications as directed. Please refer to the Current Medication list given to you today.  *If you need a refill on your cardiac medications before your next appointment, please call your pharmacy*  Lab Work: None If you have labs (blood work) drawn today and your tests are completely normal, you will receive your results only by: Marland Kitchen MyChart Message (if you have MyChart) OR . A paper copy in the mail If you have any lab test that is abnormal or we need to change your treatment, we will call you to review the results.  Testing/Procedures: none  Follow-Up: At [facility], you and your health needs are our priority.  As part of our continuing mission to provide you with exceptional heart care, we have created designated Provider Care Teams.  These Care Teams include your primary Cardiologist (physician) and Advanced Practice Providers (APPs -  Physician Assistants and Nurse Practitioners) who all work together to provide you with the care you need, when you need it.  Your next appointment:   6 month(s)  The format for your next appointment:   In Person  Provider:   Berniece Salines, DO  Other Instructions

## 2019-03-03 NOTE — Progress Notes (Signed)
Cardiology Office Note:    Date:  03/03/2019   ID:  [name], DOB 1945/04/30, MRN [id number]  PCP:  Cyndi Bender, PA-C  Cardiologist:  Berniece Salines, DO  Electrophysiologist:  None   Referring MD: Cyndi Bender, PA-C   Chief Complaint  Patient presents with  . Coronary Artery Disease    History of Present Illness:    [name] is a 73 y.o. male with a hx of CAD s/p LAD stent 08/2011, hypertension, hyperlipidemia, diabetes mellitus, and obesity. The patient was last seen in the [address] office by Terrence Dupont, PA. At that time he was experiencing chest pain and a stress test was recommended. The patient did undergo a stress testing which reported as artifactual defect and low risk.  Today he is here with his friend, he offers no complaints at this time. He wishes to reestablish cardiac care.  Past Medical History:  Diagnosis Date  . Anxiety   . Arthritis   . Chronic back pain    herniated disc  . Coronary artery disease    takes Plavix and ASA daily  . Depression    takes Prozac daily  . Diverticulitis    hx of  . Dizziness    occasionally and states Dr.Smith is aware  . DJD (degenerative joint disease)   . GERD (gastroesophageal reflux disease)    takes an OTC antacid  . Gout    yrs ago and doesn't take any meds  . Hard of hearing    wears hearing aids  . Headache(784.0)    occasionally  . History of colon polyps    benign  . History of kidney stones   . Hyperlipidemia    takes Atorvastatin daily  . Hypertension    takes Amlodipine,Losartan,and Coreg daily  . Hypothyroidism    takes Synthroid daily  . IBS (irritable bowel syndrome)   . Shortness of breath dyspnea    with exertion  . Tuberculosis    Patient denies Tb  . Type II diabetes mellitus (HCC)    takes Metformin daily  . Weakness    numbness and tingling in left leg    Past Surgical History:  Procedure Laterality Date  . ANTERIOR CERVICAL DECOMP/DISCECTOMY FUSION  04/2007   C4-5; C6-7  . BACK SURGERY     "3 times; last time was screws in lower back 10/2008"  . CHOLECYSTECTOMY  1970's  . COLONOSCOPY    . CORONARY ANGIOPLASTY  1999/2013   1 stent  . CYSTOSCOPY    . LEFT HEART CATHETERIZATION WITH CORONARY ANGIOGRAM N/A 08/21/2011   Procedure: LEFT HEART CATHETERIZATION WITH CORONARY ANGIOGRAM;  Surgeon: Sinclair Grooms, MD;  Location[facility] CATH LAB;  Service: Cardiovascular;  Laterality: N/A;  . LITHOTRIPSY    . LUMBAR LAMINECTOMY/DECOMPRESSION MICRODISCECTOMY Left 06/14/2014   Procedure: LEFT LUMBAR THREE-FOUR LANINOTOMY AND MICRODISKECTOMY.;  Surgeon: Hosie Spangle, MD;  Location: [place] NEURO ORS;  Service: Neurosurgery;  Laterality: Left;  left  . PCI  5/09/2-13   LAD  . RADIOLOGY WITH ANESTHESIA N/A 11/13/2015   Procedure: MRI OF LUMBAR SPINE W/WO CONTRAST    (RADIOLOGY WITH ANESTHESIA);  Surgeon: Medication Radiologist, MD;  Location: [place];  Service: Radiology;  Laterality: N/A;  . SHOULDER ARTHROSCOPY W/ ROTATOR CUFF REPAIR  ~ 2011   left    Current Medications: Current Meds  Medication Sig  . acetaminophen (TYLENOL) 500 MG tablet Take 1,000 mg by mouth every 6 (six) hours as needed for mild pain.   Marland Kitchen  amLODipine (NORVASC) 10 MG tablet Take 10 mg by mouth daily.  Marland Kitchen aspirin 81 MG tablet Take 81 mg by mouth daily.  Marland Kitchen atorvastatin (LIPITOR) 20 MG tablet Take 20 mg by mouth daily.  . carvedilol (COREG) 25 MG tablet Take 25 mg by mouth 2 (two) times daily with a meal.  . clopidogrel (PLAVIX) 75 MG tablet Take 75 mg by mouth daily with breakfast.  . furosemide (LASIX) 20 MG tablet Take 20 mg by mouth daily as needed.  . hydrochlorothiazide (HYDRODIURIL) 25 MG tablet Take 25 mg by mouth daily.   Marland Kitchen HYDROmorphone (DILAUDID) 2 MG tablet Take 2 mg by mouth every 4 (four) hours as needed for moderate pain or severe pain.   Marland Kitchen ibuprofen (ADVIL) 200 MG tablet Take 200 mg by mouth as needed.  Marland Kitchen levothyroxine (SYNTHROID, LEVOTHROID) 50 MCG tablet Take 50 mcg by mouth  daily before breakfast.  . losartan (COZAAR) 100 MG tablet Take 100 mg by mouth daily.  . metFORMIN (GLUCOPHAGE-XR) 500 MG 24 hr tablet Take 500 mg by mouth 2 (two) times daily.   . nitroGLYCERIN (NITROSTAT) 0.4 MG SL tablet Place 0.4 mg under the tongue every 5 (five) minutes as needed. For chest pain  . sertraline (ZOLOFT) 100 MG tablet Take 100 mg by mouth daily.     Allergies:   Sulfa antibiotics   Social History   Socioeconomic History  . Marital status: Single    Spouse name: Not on file  . Number of children: Not on file  . Years of education: Not on file  . Highest education level: Not on file  Occupational History  . Not on file  Social Needs  . Financial resource strain: Not on file  . Food insecurity    Worry: Not on file    Inability: Not on file  . Transportation needs    Medical: Not on file    Non-medical: Not on file  Tobacco Use  . Smoking status: Former Research scientist (life sciences)  . Smokeless tobacco: Current User    Types: Chew  Substance and Sexual Activity  . Alcohol use: No  . Drug use: No  . Sexual activity: Never  Lifestyle  . Physical activity    Days per week: Not on file    Minutes per session: Not on file  . Stress: Not on file  Relationships  . Social Herbalist on phone: Not on file    Gets together: Not on file    Attends religious service: Not on file    Active member of club or organization: Not on file    Attends meetings of clubs or organizations: Not on file    Relationship status: Not on file  Other Topics Concern  . Not on file  Social History Narrative  . Not on file     Family History: The patient's family history includes Arthritis in his mother; Cancer in his father; Healthy in his sister and sister; Heart attack in his brother; Heart disease in his brother; Hypertension in his brother.  ROS:   Review of Systems  Constitution: Negative for decreased appetite, fever and weight gain.  HENT: Negative for congestion, ear  discharge, hoarse voice and sore throat.   Eyes: Negative for discharge, redness, vision loss in right eye and visual halos.  Cardiovascular: Negative for chest pain, dyspnea on exertion, leg swelling, orthopnea and palpitations.  Respiratory: Negative for cough, hemoptysis, shortness of breath and snoring.   Endocrine: Negative for heat intolerance  and polyphagia.  Hematologic/Lymphatic: Negative for bleeding problem. Does not bruise/bleed easily.  Skin: Negative for flushing, nail changes, rash and suspicious lesions.  Musculoskeletal: Negative for arthritis, joint pain, muscle cramps, myalgias, neck pain and stiffness.  Gastrointestinal: Negative for abdominal pain, bowel incontinence, diarrhea and excessive appetite.  Genitourinary: Negative for decreased libido, genital sores and incomplete emptying.  Neurological: Negative for brief paralysis, focal weakness, headaches and loss of balance.  Psychiatric/Behavioral: Negative for altered mental status, depression and suicidal ideas.  Allergic/Immunologic: Negative for HIV exposure and persistent infections.    EKGs/Labs/Other Studies Reviewed:    The following studies were reviewed today:   EKG:  The ekg ordered today demonstrates sinus rhythm, HR 63 bpm.   Recent Labs: No results found for requested labs within last 8760 hours.  Recent Lipid Panel    Component Value Date/Time   CHOL 175 07/31/2011 0352   TRIG 161 (H) 07/31/2011 0352   HDL 38 (L) 07/31/2011 0352   CHOLHDL 4.6 07/31/2011 0352   VLDL 32 07/31/2011 0352   LDLCALC 105 (H) 07/31/2011 0352    Physical Exam:    VS:  BP 114/60 (BP Location: Right Arm, Patient Position: Sitting, Cuff Size: Normal)   Pulse 63   Ht 5\' 9"  (1.753 m)   Wt 240 lb (108.9 kg)   SpO2 95%   BMI 35.44 kg/m     Wt Readings from Last 3 Encounters:  03/03/19 240 lb (108.9 kg)  02/06/16 249 lb (112.9 kg)  01/30/16 249 lb 12.8 oz (113.3 kg)     GEN: Well nourished, well developed in no  acute distress HEENT: Normal NECK: No JVD; No carotid bruits LYMPHATICS: No lymphadenopathy CARDIAC: S1S2 noted,RRR, no murmurs, rubs, gallops RESPIRATORY:  Clear to auscultation without rales, wheezing or rhonchi  ABDOMEN: Soft, non-tender, non-distended, +bowel sounds, no guarding. EXTREMITIES: No edema, No cyanosis, no clubbing MUSCULOSKELETAL:  No edema; No deformity  SKIN: Warm and dry NEUROLOGIC:  Alert and oriented x 3, non-focal PSYCHIATRIC:  Normal affect, good insight  ASSESSMENT:    1. Essential hypertension, benign   2. Coronary artery disease involving native coronary artery of native heart without angina pectoris   3. Mixed hyperlipidemia    PLAN:     1. His blood pressure is acceptable in the office today no need for a change in his current medication regimen. I have requested to get labs from his pcp -  I like to see his recent lipid profile to make sure that he is meeting a target of >70.   I plan to see the patient in 6 months or sooner if needed.  The patient is in agreement with the above plan. The patient left the office in stable condition.    Medication Adjustments/Labs and Tests Ordered: Current medicines are reviewed at length with the patient today.  Concerns regarding medicines are outlined above.  Orders Placed This Encounter  Procedures  . EKG 12-Lead   No orders of the defined types were placed in this encounter.   Patient Instructions  Medication Instructions:  Your physician recommends that you continue on your current medications as directed. Please refer to the Current Medication list given to you today.  *If you need a refill on your cardiac medications before your next appointment, please call your pharmacy*  Lab Work: None If you have labs (blood work) drawn today and your tests are completely normal, you will receive your results only by: Marland Kitchen MyChart Message (if you have MyChart) OR .  A paper copy in the mail If you have any lab test  that is abnormal or we need to change your treatment, we will call you to review the results.  Testing/Procedures: none  Follow-Up: At [facility], you and your health needs are our priority.  As part of our continuing mission to provide you with exceptional heart care, we have created designated Provider Care Teams.  These Care Teams include your primary Cardiologist (physician) and Advanced Practice Providers (APPs -  Physician Assistants and Nurse Practitioners) who all work together to provide you with the care you need, when you need it.  Your next appointment:   6 month(s)  The format for your next appointment:   In Person  Provider:   Berniece Salines, DO  Other Instructions      Adopting a Healthy Lifestyle.  Know what a healthy weight is for you (roughly BMI <25) and aim to maintain this   Aim for 7+ servings of fruits and vegetables daily   65-80+ fluid ounces of water or unsweet tea for healthy kidneys   Limit to max 1 drink of alcohol per day; avoid smoking/tobacco   Limit animal fats in diet for cholesterol and heart health - choose grass fed whenever available   Avoid highly processed foods, and foods high in saturated/trans fats   Aim for low stress - take time to unwind and care for your mental health   Aim for 150 min of moderate intensity exercise weekly for heart health, and weights twice weekly for bone health   Aim for 7-9 hours of sleep daily   When it comes to diets, agreement about the perfect plan isnt easy to find, even among the experts. Experts at the [place] developed an idea known as the Healthy Eating Plate. Just imagine a plate divided into logical, healthy portions.   The emphasis is on diet quality:   Load up on vegetables and fruits - one-half of your plate: Aim for color and variety, and remember that potatoes dont count.   Go for whole grains - one-quarter of your plate: Whole wheat, barley, wheat berries,  quinoa, oats, brown rice, and foods made with them. If you want pasta, go with whole wheat pasta.   Protein power - one-quarter of your plate: Fish, chicken, beans, and nuts are all healthy, versatile protein sources. Limit red meat.   The diet, however, does go beyond the plate, offering a few other suggestions.   Use healthy plant oils, such as olive, canola, soy, corn, sunflower and peanut. Check the labels, and avoid partially hydrogenated oil, which have unhealthy trans fats.   If youre thirsty, drink water. Coffee and tea are good in moderation, but skip sugary drinks and limit milk and dairy products to one or two daily servings.   The type of carbohydrate in the diet is more important than the amount. Some sources of carbohydrates, such as vegetables, fruits, whole grains, and beans-are healthier than others.   Finally, stay active  Signed, Berniece Salines, DO  03/03/2019 9:20 PM    [facility]

## 2019-03-08 DIAGNOSIS — Z7982 Long term (current) use of aspirin: Secondary | ICD-10-CM | POA: Diagnosis not present

## 2019-03-08 DIAGNOSIS — M5416 Radiculopathy, lumbar region: Secondary | ICD-10-CM | POA: Diagnosis not present

## 2019-03-08 DIAGNOSIS — Z7984 Long term (current) use of oral hypoglycemic drugs: Secondary | ICD-10-CM | POA: Diagnosis not present

## 2019-03-08 DIAGNOSIS — I10 Essential (primary) hypertension: Secondary | ICD-10-CM | POA: Diagnosis not present

## 2019-03-08 DIAGNOSIS — Z7902 Long term (current) use of antithrombotics/antiplatelets: Secondary | ICD-10-CM | POA: Diagnosis not present

## 2019-03-08 DIAGNOSIS — E119 Type 2 diabetes mellitus without complications: Secondary | ICD-10-CM | POA: Diagnosis not present

## 2019-03-17 DIAGNOSIS — E119 Type 2 diabetes mellitus without complications: Secondary | ICD-10-CM | POA: Diagnosis not present

## 2019-03-17 DIAGNOSIS — I10 Essential (primary) hypertension: Secondary | ICD-10-CM | POA: Diagnosis not present

## 2019-03-17 DIAGNOSIS — Z7982 Long term (current) use of aspirin: Secondary | ICD-10-CM | POA: Diagnosis not present

## 2019-03-17 DIAGNOSIS — Z7902 Long term (current) use of antithrombotics/antiplatelets: Secondary | ICD-10-CM | POA: Diagnosis not present

## 2019-03-17 DIAGNOSIS — M5416 Radiculopathy, lumbar region: Secondary | ICD-10-CM | POA: Diagnosis not present

## 2019-03-17 DIAGNOSIS — Z7984 Long term (current) use of oral hypoglycemic drugs: Secondary | ICD-10-CM | POA: Diagnosis not present

## 2019-03-18 DIAGNOSIS — I10 Essential (primary) hypertension: Secondary | ICD-10-CM | POA: Diagnosis not present

## 2019-03-18 DIAGNOSIS — Z7984 Long term (current) use of oral hypoglycemic drugs: Secondary | ICD-10-CM | POA: Diagnosis not present

## 2019-03-18 DIAGNOSIS — Z7982 Long term (current) use of aspirin: Secondary | ICD-10-CM | POA: Diagnosis not present

## 2019-03-18 DIAGNOSIS — M5416 Radiculopathy, lumbar region: Secondary | ICD-10-CM | POA: Diagnosis not present

## 2019-03-18 DIAGNOSIS — Z7902 Long term (current) use of antithrombotics/antiplatelets: Secondary | ICD-10-CM | POA: Diagnosis not present

## 2019-03-18 DIAGNOSIS — E119 Type 2 diabetes mellitus without complications: Secondary | ICD-10-CM | POA: Diagnosis not present

## 2019-05-11 DIAGNOSIS — C44212 Basal cell carcinoma of skin of right ear and external auricular canal: Secondary | ICD-10-CM | POA: Diagnosis not present

## 2019-05-28 ENCOUNTER — Emergency Department (HOSPITAL_COMMUNITY)
Admission: EM | Admit: 2019-05-28 | Discharge: 2019-05-28 | Disposition: A | Discharge status: Home / Self Care | Payer: Medicare Other | Attending: Emergency Medicine | Admitting: Emergency Medicine

## 2019-05-28 ENCOUNTER — Encounter (HOSPITAL_COMMUNITY): Payer: Self-pay | Admitting: Emergency Medicine

## 2019-05-28 DIAGNOSIS — L7621 Postprocedural hemorrhage and hematoma of skin and subcutaneous tissue following a dermatologic procedure: Secondary | ICD-10-CM | POA: Diagnosis not present

## 2019-05-28 DIAGNOSIS — R58 Hemorrhage, not elsewhere classified: Secondary | ICD-10-CM | POA: Diagnosis not present

## 2019-05-28 DIAGNOSIS — E119 Type 2 diabetes mellitus without complications: Secondary | ICD-10-CM | POA: Insufficient documentation

## 2019-05-28 DIAGNOSIS — Z7901 Long term (current) use of anticoagulants: Secondary | ICD-10-CM | POA: Diagnosis not present

## 2019-05-28 DIAGNOSIS — F17290 Nicotine dependence, other tobacco product, uncomplicated: Secondary | ICD-10-CM | POA: Insufficient documentation

## 2019-05-28 DIAGNOSIS — T148XXA Other injury of unspecified body region, initial encounter: Secondary | ICD-10-CM

## 2019-05-28 DIAGNOSIS — Z79899 Other long term (current) drug therapy: Secondary | ICD-10-CM | POA: Diagnosis not present

## 2019-05-28 DIAGNOSIS — Z7984 Long term (current) use of oral hypoglycemic drugs: Secondary | ICD-10-CM | POA: Diagnosis not present

## 2019-05-28 DIAGNOSIS — H9542 Postprocedural hemorrhage and hematoma of ear and mastoid process following other procedure: Secondary | ICD-10-CM | POA: Diagnosis not present

## 2019-05-28 DIAGNOSIS — I2510 Atherosclerotic heart disease of native coronary artery without angina pectoris: Secondary | ICD-10-CM | POA: Insufficient documentation

## 2019-05-28 DIAGNOSIS — I10 Essential (primary) hypertension: Secondary | ICD-10-CM | POA: Diagnosis not present

## 2019-05-28 MED ORDER — SILVER NITRATE-POT NITRATE 75-25 % EX MISC
1.0000 | Freq: Once | CUTANEOUS | Status: AC
  Administered 2019-05-28: 1 via TOPICAL
  Filled 2019-05-28: qty 1

## 2019-05-28 NOTE — Discharge Instructions (Addendum)
You can remove the dressing tomorrow morning. Be careful when using your hearing aid as this may cause bleeding again. Call your surgeon on Monday to schedule a follow-up appointment sooner. Return to the ED if you start to have bleeding again, lightheadedness, shortness of breath, injuries or falls.

## 2019-05-28 NOTE — ED Triage Notes (Signed)
Pt. Stated, I had a tumor removed a week ago and it started bleeding yesteday, stopped and started back and can't get it to stop.

## 2019-05-28 NOTE — ED Provider Notes (Signed)
[facility] EMERGENCY DEPARTMENT Provider Note   CSN: [id number] Arrival date & time: 05/28/19  1037     History Chief Complaint  Patient presents with  . Otalgia    [name] is a 74 y.o. male with a past medical history of hypertension, diabetes, takes Plavix daily presents to ED with a chief complaint of bleeding.  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  Yesterday noticed that there was an area of bleeding that has not stopped for the past 12 hours.  He denies any pain around the site, changes to hearing, fever, lightheadedness.  He is scheduled to follow-up with his surgeon in 3 months.  He tried to call them this morning but did not get an answer.  HPI     Past Medical History:  Diagnosis Date  . Anxiety   . Arthritis   . Chronic back pain    herniated disc  . Coronary artery disease    takes Plavix and ASA daily  . Depression    takes Prozac daily  . Diverticulitis    hx of  . Dizziness    occasionally and states Dr.Smith is aware  . DJD (degenerative joint disease)   . GERD (gastroesophageal reflux disease)    takes an OTC antacid  . Gout    yrs ago and doesn't take any meds  . Hard of hearing    wears hearing aids  . Headache(784.0)    occasionally  . History of colon polyps    benign  . History of kidney stones   . Hyperlipidemia    takes Atorvastatin daily  . Hypertension    takes Amlodipine,Losartan,and Coreg daily  . Hypothyroidism    takes Synthroid daily  . IBS (irritable bowel syndrome)   . Shortness of breath dyspnea    with exertion  . Tuberculosis    Patient denies Tb  . Type II diabetes mellitus (HCC)    takes Metformin daily  . Weakness    numbness and tingling in left leg    Patient Active Problem List   Diagnosis Date Noted  . HNP (herniated nucleus pulposus), lumbar 06/14/2014  . Atypical chest pain 12/28/2013  . Unstable angina ([place]) 12/27/2013  .  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[place]) 03/14/2013    Class: Chronic  . Hyperlipidemia 03/14/2013  . Chest pain 07/30/2011  . Obesity, unspecified 07/30/2011  . Essential hypertension, benign 07/30/2011  . Diabetes mellitus type 2, controlled, with complications ([place]) [id number]    Past Surgical History:  Procedure Laterality Date  . ANTERIOR CERVICAL DECOMP/DISCECTOMY FUSION  04/2007   C4-5; C6-7  . BACK SURGERY     "3 times; last time was screws in lower back 10/2008"  . CHOLECYSTECTOMY  1970's  . COLONOSCOPY    . CORONARY ANGIOPLASTY  1999/2013   1 stent  . CYSTOSCOPY    . LEFT HEART CATHETERIZATION WITH CORONARY ANGIOGRAM N/A 08/21/2011   Procedure: LEFT HEART CATHETERIZATION WITH CORONARY ANGIOGRAM;  Surgeon: Sinclair Grooms, MD;  Location: [facility] CATH LAB;  Service: Cardiovascular;  Laterality: N/A;  . LITHOTRIPSY    . LUMBAR LAMINECTOMY/DECOMPRESSION MICRODISCECTOMY Left 06/14/2014   Procedure: LEFT LUMBAR THREE-FOUR LANINOTOMY AND MICRODISKECTOMY.;  Surgeon: Hosie Spangle, MD;  Location: [place] NEURO ORS;  Service: Neurosurgery;  Laterality: Left;  left  . PCI  5/09/2-13   LAD  . RADIOLOGY WITH ANESTHESIA N/A 11/13/2015  Procedure: MRI OF LUMBAR SPINE W/WO CONTRAST    (RADIOLOGY WITH ANESTHESIA);  Surgeon: Medication Radiologist, MD;  Location: [place];  Service: Radiology;  Laterality: N/A;  . SHOULDER ARTHROSCOPY W/ ROTATOR CUFF REPAIR  ~ 2011   left       Family History  Problem Relation Age of Onset  . Cancer Father        colon  . Arthritis Mother   . Healthy Sister   . Heart attack Brother   . Heart disease Brother   . Hypertension Brother   . Healthy Sister     Social History   Tobacco Use  . Smoking status: Former Research scientist (life sciences)  . Smokeless tobacco: Current User    Types: Chew  Substance Use Topics  . Alcohol use: No  . Drug use: No    Home Medications Prior to Admission medications   Medication Sig Start Date End  Date Taking? Authorizing Provider  acetaminophen (TYLENOL) 500 MG tablet Take 1,000 mg by mouth every 6 (six) hours as needed for mild pain.     [provider]  amLODipine (NORVASC) 10 MG tablet Take 10 mg by mouth daily.    [provider]  aspirin 81 MG tablet Take 81 mg by mouth daily.    [provider]  atorvastatin (LIPITOR) 20 MG tablet Take 20 mg by mouth daily.    [provider]  carvedilol (COREG) 25 MG tablet Take 25 mg by mouth 2 (two) times daily with a meal.    [provider]  clopidogrel (PLAVIX) 75 MG tablet Take 75 mg by mouth daily with breakfast.    [provider]  furosemide (LASIX) 20 MG tablet Take 20 mg by mouth daily as needed. 02/17/19   [provider]  hydrochlorothiazide (HYDRODIURIL) 25 MG tablet Take 25 mg by mouth daily.     [provider]  HYDROmorphone (DILAUDID) 2 MG tablet Take 2 mg by mouth every 4 (four) hours as needed for moderate pain or severe pain.  10/06/15   [provider]  ibuprofen (ADVIL) 200 MG tablet Take 200 mg by mouth as needed.    [provider]  levothyroxine (SYNTHROID, LEVOTHROID) 50 MCG tablet Take 50 mcg by mouth daily before breakfast. 02/26/15   [provider]  losartan (COZAAR) 100 MG tablet Take 100 mg by mouth daily.    [provider]  metFORMIN (GLUCOPHAGE-XR) 500 MG 24 hr tablet Take 500 mg by mouth 2 (two) times daily.     [provider]  nitroGLYCERIN (NITROSTAT) 0.4 MG SL tablet Place 0.4 mg under the tongue every 5 (five) minutes as needed. For chest pain    [provider]  sertraline (ZOLOFT) 100 MG tablet Take 100 mg by mouth daily. 02/23/19   [provider]    Allergies    Sulfa antibiotics  Review of Systems   Review of Systems  Constitutional: Negative for appetite change, chills and fever.  HENT: Negative for ear pain, rhinorrhea, sneezing and sore throat.   Eyes: Negative  for photophobia and visual disturbance.  Respiratory: Negative for cough, chest tightness, shortness of breath and wheezing.   Cardiovascular: Negative for chest pain and palpitations.  Gastrointestinal: Negative for abdominal pain, blood in stool, constipation, diarrhea, nausea and vomiting.  Genitourinary: Negative for dysuria, hematuria and urgency.  Musculoskeletal: Negative for myalgias.  Skin: Positive for wound. Negative for rash.  Neurological: Negative for dizziness, weakness and light-headedness.    Physical Exam Updated  Vital Signs BP 117/67   Pulse 64   Temp 98.1 F (36.7 C) (Oral)   Resp 14   SpO2 100%   Physical Exam Vitals and nursing note reviewed.  Constitutional:      General: He is not in acute distress.    Appearance: He is well-developed.  HENT:     Head: Normocephalic and atraumatic.     Ears:     Comments: Wound noted behind R ear with a punctate area of oozing blood.    Nose: Nose normal.  Eyes:     General: No scleral icterus.       Left eye: No discharge.     Conjunctiva/sclera: Conjunctivae normal.  Cardiovascular:     Rate and Rhythm: Normal rate and regular rhythm.     Heart sounds: Normal heart sounds. No murmur. No friction rub. No gallop.   Pulmonary:     Effort: Pulmonary effort is normal. No respiratory distress.     Breath sounds: Normal breath sounds.  Abdominal:     General: Bowel sounds are normal. There is no distension.     Palpations: Abdomen is soft.     Tenderness: There is no abdominal tenderness. There is no guarding.  Musculoskeletal:        General: Normal range of motion.     Cervical back: Normal range of motion and neck supple.  Skin:    General: Skin is warm and dry.     Findings: No rash.  Neurological:     Mental Status: He is alert.     Motor: No abnormal muscle tone.     Coordination: Coordination normal.     ED Results / Procedures / Treatments   Labs (all labs ordered are listed, but only abnormal  results are displayed) Labs Reviewed - No data to display  EKG None  Radiology No results found.  Procedures Procedures (including critical care time)  Medications Ordered in ED Medications  silver nitrate applicators applicator 1 Stick (1 Stick Topical Given 05/28/19 1315)    ED Course  I have reviewed the triage vital signs and the nursing notes.  Pertinent labs & imaging results that were available during my care of the patient were reviewed by me and considered in my medical decision making (see chart for details).    MDM Rules/Calculators/A&P                      74 year old male presenting to the ED for bleeding wound behind his right ear.  He had a tumor removed by his surgeon approximately 10 days ago.  He had bleeding beginning yesterday that has not stopped.  He does wear hearing aid and this could be the cause of the bleeding as it may have scraped the area.  On exam there is a punctate area of bleeding that I was able to cauterize with silver nitrate.  No subsequent bleeding noted for the next 30 minutes.  Pressure dressing was placed and patient was told to remove this in 1 day.  Also informed him to be careful with dressing when he wears his hearing aid.  Have him follow-up with his specialist and return for worsening symptoms. He remains, hemodynamically stable without any complaints.  Patient is hemodynamically stable, in NAD, and able to ambulate in the ED. Evaluation does not show pathology that would require ongoing emergent intervention or inpatient treatment. I explained the diagnosis to the patient. Pain has been managed and has no complaints  prior to discharge. Patient is comfortable with above plan and is stable for discharge at this time. All questions were answered prior to disposition. Strict return precautions for returning to the ED were discussed. Encouraged follow up with PCP.   An After Visit Summary was printed and given to the patient.   Portions of  this note were generated with [employer]. Dictation errors may occur despite best attempts at proofreading.  Final Clinical Impression(s) / ED Diagnoses Final diagnoses:  Bleeding from wound    Rx / DC Orders ED Discharge Orders    None       Delia Heady, PA-C 05/28/19 1324    Little, Wenda Overland, MD 05/31/19 [phone]

## 2019-06-29 DIAGNOSIS — R1032 Left lower quadrant pain: Secondary | ICD-10-CM | POA: Diagnosis not present

## 2019-06-29 DIAGNOSIS — Z6831 Body mass index (BMI) 31.0-31.9, adult: Secondary | ICD-10-CM | POA: Diagnosis not present

## 2019-06-29 DIAGNOSIS — R600 Localized edema: Secondary | ICD-10-CM | POA: Diagnosis not present

## 2019-06-29 DIAGNOSIS — E119 Type 2 diabetes mellitus without complications: Secondary | ICD-10-CM | POA: Diagnosis not present

## 2019-08-09 DIAGNOSIS — D225 Melanocytic nevi of trunk: Secondary | ICD-10-CM | POA: Diagnosis not present

## 2019-08-09 DIAGNOSIS — L820 Inflamed seborrheic keratosis: Secondary | ICD-10-CM | POA: Diagnosis not present

## 2019-08-09 DIAGNOSIS — L814 Other melanin hyperpigmentation: Secondary | ICD-10-CM | POA: Diagnosis not present

## 2019-08-09 DIAGNOSIS — L570 Actinic keratosis: Secondary | ICD-10-CM | POA: Diagnosis not present

## 2019-08-09 DIAGNOSIS — R233 Spontaneous ecchymoses: Secondary | ICD-10-CM | POA: Diagnosis not present

## 2019-08-31 DIAGNOSIS — Z23 Encounter for immunization: Secondary | ICD-10-CM | POA: Diagnosis not present

## 2019-09-05 ENCOUNTER — Other Ambulatory Visit: Payer: Self-pay

## 2019-09-06 ENCOUNTER — Other Ambulatory Visit: Payer: Self-pay

## 2019-09-06 ENCOUNTER — Encounter: Payer: Self-pay | Admitting: Cardiology

## 2019-09-06 ENCOUNTER — Ambulatory Visit (INDEPENDENT_AMBULATORY_CARE_PROVIDER_SITE_OTHER): Payer: Medicare Other | Admitting: Cardiology

## 2019-09-06 VITALS — BP 126/62 | HR 68 | Ht 69.0 in | Wt 228.4 lb

## 2019-09-06 DIAGNOSIS — Z01810 Encounter for preprocedural cardiovascular examination: Secondary | ICD-10-CM

## 2019-09-06 DIAGNOSIS — R5383 Other fatigue: Secondary | ICD-10-CM

## 2019-09-06 DIAGNOSIS — I2510 Atherosclerotic heart disease of native coronary artery without angina pectoris: Secondary | ICD-10-CM | POA: Diagnosis not present

## 2019-09-06 DIAGNOSIS — I10 Essential (primary) hypertension: Secondary | ICD-10-CM

## 2019-09-06 HISTORY — DX: Encounter for preprocedural cardiovascular examination: Z01.810

## 2019-09-06 HISTORY — DX: Other fatigue: R53.83

## 2019-09-06 NOTE — Progress Notes (Signed)
Cardiology Office Note:    Date:  09/06/2019   ID:  CRAWFORD GUADAGNINO, DOB 19-Apr-1945, MRN [id number]  PCP:  Cyndi Bender, PA-C  Cardiologist:  Berniece Salines, DO  Electrophysiologist:  None   Referring MD: Cyndi Bender, PA-C   "I have been tired recently"  History of Present Illness:    [name] is a 74 y.o. male with a hx of CAD s/p LAD stent 08/2011, hypertension, hyperlipidemia, diabetes mellitus, and obesity.  I last saw the patient on 03/03/2019 at that time he was doing well from a cardiovascular standpoint.   He is here today for follow-up visit.  Tells me he has been experiencing some fatigue.  He also is planning at some point to get a shoulder surgery done in the near future.  No other complaints at this time.  Past Medical History:  Diagnosis Date  . Anxiety   . Arthritis   . 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[place]) 03/14/2013  . Atypical chest pain 12/28/2013  . Chest pain 07/30/2011   1999 Cath - <50% stenosis 2008 NUC - low risk   . Chronic back pain    herniated disc  . Coronary artery disease    takes Plavix and ASA daily  . Depression    takes Prozac daily  . Diabetes mellitus type 2, controlled, with complications ([place]) [id number]  . Diverticulitis    hx of  . Dizziness    occasionally and states Dr.Smith is aware  . DJD (degenerative joint disease)   . Essential hypertension, benign 07/30/2011  . GERD (gastroesophageal reflux disease)    takes an OTC antacid  . Gout    yrs ago and doesn't take any meds  . Hard of hearing    wears hearing aids  . Headache(784.0)    occasionally  . History of colon polyps    benign  . History of kidney stones   . HNP (herniated nucleus pulposus), lumbar 06/14/2014  . Hyperlipidemia    takes Atorvastatin daily  . Hypertension    takes Amlodipine,Losartan,and Coreg daily  . Hypothyroidism    takes Synthroid daily  . IBS (irritable bowel syndrome)   . Obesity, unspecified  07/30/2011  . Shortness of breath dyspnea    with exertion  . Tuberculosis    Patient denies Tb  . Type II diabetes mellitus (H[facility])    takes Metformin daily  . Unstable angina ([place]) 12/27/2013  . Weakness    numbness and tingling in left leg    Past Surgical History:  Procedure Laterality Date  . ANTERIOR CERVICAL DECOMP/DISCECTOMY FUSION  04/2007   C4-5; C6-7  . BACK SURGERY     "3 times; last time was screws in lower back 10/2008"  . CHOLECYSTECTOMY  1970's  . COLONOSCOPY    . CORONARY ANGIOPLASTY  1999/2013   1 stent  . CYSTOSCOPY    . LEFT HEART CATHETERIZATION WITH CORONARY ANGIOGRAM N/A 08/21/2011   Procedure: LEFT HEART CATHETERIZATION WITH CORONARY ANGIOGRAM;  Surgeon: Sinclair Grooms, MD;  Location: [facility] CATH LAB;  Service: Cardiovascular;  Laterality: N/A;  . LITHOTRIPSY    . LUMBAR LAMINECTOMY/DECOMPRESSION MICRODISCECTOMY Left 06/14/2014   Procedure: LEFT LUMBAR THREE-FOUR LANINOTOMY AND MICRODISKECTOMY.;  Surgeon: Hosie Spangle, MD;  Location: [facility] NEURO ORS;  Service: Neurosurgery;  Laterality: Left;  left  . PCI  5/09/2-13   LAD  . RADIOLOGY WITH ANESTHESIA N/A 11/13/2015   Procedure: MRI OF LUMBAR SPINE W/WO CONTRAST    (  RADIOLOGY WITH ANESTHESIA);  Surgeon: Medication Radiologist, MD;  Location: [facility];  Service: Radiology;  Laterality: N/A;  . SHOULDER ARTHROSCOPY W/ ROTATOR CUFF REPAIR  ~ 2011   left    Current Medications: Current Meds  Medication Sig  . acetaminophen (TYLENOL) 500 MG tablet Take 1,000 mg by mouth every 6 (six) hours as needed for mild pain.   Marland Kitchen amLODipine (NORVASC) 10 MG tablet Take 10 mg by mouth daily.  Marland Kitchen ammonium lactate (LAC-HYDRIN) 12 % lotion   . aspirin 81 MG tablet Take 81 mg by mouth daily.  Marland Kitchen atorvastatin (LIPITOR) 20 MG tablet Take 20 mg by mouth daily.  . carvedilol (COREG) 25 MG tablet Take 25 mg by mouth 2 (two) times daily with a meal.  . clopidogrel (PLAVIX) 75 MG tablet Take 75 mg by mouth daily with breakfast.  . furosemide  (LASIX) 20 MG tablet Take 20 mg by mouth daily as needed.  . gabapentin (NEURONTIN) 300 MG capsule   . hydrochlorothiazide (HYDRODIURIL) 25 MG tablet Take 25 mg by mouth daily.   Marland Kitchen HYDROmorphone (DILAUDID) 2 MG tablet Take 2 mg by mouth every 4 (four) hours as needed for moderate pain or severe pain.   Marland Kitchen ibuprofen (ADVIL) 200 MG tablet Take 200 mg by mouth as needed.  Marland Kitchen levothyroxine (SYNTHROID, LEVOTHROID) 50 MCG tablet Take 50 mcg by mouth daily before breakfast.  . losartan (COZAAR) 100 MG tablet Take 100 mg by mouth daily.  . metFORMIN (GLUCOPHAGE-XR) 500 MG 24 hr tablet Take 500 mg by mouth 2 (two) times daily.   . nitroGLYCERIN (NITROSTAT) 0.4 MG SL tablet Place 0.4 mg under the tongue every 5 (five) minutes as needed. For chest pain  . sertraline (ZOLOFT) 100 MG tablet Take 100 mg by mouth daily.  . traMADol (ULTRAM) 50 MG tablet Take 50 mg by mouth every 4 (four) hours as needed.     Allergies:   Sulfa antibiotics   Social History   Socioeconomic History  . Marital status: Single    Spouse name: Not on file  . Number of children: Not on file  . Years of education: Not on file  . Highest education level: Not on file  Occupational History  . Not on file  Tobacco Use  . Smoking status: Former Research scientist (life sciences)  . Smokeless tobacco: Current User    Types: Chew  Substance and Sexual Activity  . Alcohol use: No  . Drug use: No  . Sexual activity: Never  Other Topics Concern  . Not on file  Social History Narrative  . Not on file   Social Determinants of Health   Financial Resource Strain:   . Difficulty of Paying Living Expenses:   Food Insecurity:   . Worried About Charity fundraiser in the Last Year:   . Arboriculturist in the Last Year:   Transportation Needs:   . Film/video editor (Medical):   Marland Kitchen Lack of Transportation (Non-Medical):   Physical Activity:   . Days of Exercise per Week:   . Minutes of Exercise per Session:   Stress:   . Feeling of Stress :   Social  Connections:   . Frequency of Communication with Friends and Family:   . Frequency of Social Gatherings with Friends and Family:   . Attends Religious Services:   . Active Member of Clubs or Organizations:   . Attends Archivist Meetings:   Marland Kitchen Marital Status:      Family History:  The patient's family history includes Arthritis in his mother; Cancer in his father; Healthy in his sister and sister; Heart attack in his brother; Heart disease in his brother; Hypertension in his brother.  ROS:   Review of Systems  Constitution: Reports fatigue.  Negative for decreased appetite, fever and weight gain.  HENT: Negative for congestion, ear discharge, hoarse voice and sore throat.   Eyes: Negative for discharge, redness, vision loss in right eye and visual halos.  Cardiovascular: Negative for chest pain, dyspnea on exertion, leg swelling, orthopnea and palpitations.  Respiratory: Negative for cough, hemoptysis, shortness of breath and snoring.   Endocrine: Negative for heat intolerance and polyphagia.  Hematologic/Lymphatic: Negative for bleeding problem. Does not bruise/bleed easily.  Skin: Negative for flushing, nail changes, rash and suspicious lesions.  Musculoskeletal: Negative for arthritis, joint pain, muscle cramps, myalgias, neck pain and stiffness.  Gastrointestinal: Negative for abdominal pain, bowel incontinence, diarrhea and excessive appetite.  Genitourinary: Negative for decreased libido, genital sores and incomplete emptying.  Neurological: Negative for brief paralysis, focal weakness, headaches and loss of balance.  Psychiatric/Behavioral: Negative for altered mental status, depression and suicidal ideas.  Allergic/Immunologic: Negative for HIV exposure and persistent infections.    EKGs/Labs/Other Studies Reviewed:    The following studies were reviewed today:   EKG:   none today  Pharmacologic stress test 2017  The left ventricular ejection fraction is normal  (55-65%).  Nuclear stress EF: 55%.  There was no ST segment deviation noted during stress.  There is a medium defect of moderate severity present in the basal inferoseptal, basal inferior, basal inferolateral and mid inferior location. The defect is partially reversible and could represent a small area of ischemia but likely is due to variations in diaphragmatic attenuation artifact.  This is a low risk study.   Recent Labs: No results found for requested labs within last 8760 hours.  Recent Lipid Panel    Component Value Date/Time   CHOL 175 07/31/2011 0352   TRIG 161 (H) 07/31/2011 0352   HDL 38 (L) 07/31/2011 0352   CHOLHDL 4.6 07/31/2011 0352   VLDL 32 07/31/2011 0352   LDLCALC 105 (H) 07/31/2011 0352    Physical Exam:    VS:  BP 126/62   Pulse 68   Ht 5\' 9"  (1.753 m)   Wt 228 lb 6.4 oz (103.6 kg)   SpO2 97%   BMI 33.73 kg/m     Wt Readings from Last 3 Encounters:  09/06/19 228 lb 6.4 oz (103.6 kg)  03/03/19 240 lb (108.9 kg)  02/06/16 249 lb (112.9 kg)     GEN: Well nourished, well developed in no acute distress HEENT: Normal NECK: No JVD; No carotid bruits LYMPHATICS: No lymphadenopathy CARDIAC: S1S2 noted,RRR, no murmurs, rubs, gallops RESPIRATORY:  Clear to auscultation without rales, wheezing or rhonchi  ABDOMEN: Soft, non-tender, non-distended, +bowel sounds, no guarding. EXTREMITIES: No edema, No cyanosis, no clubbing MUSCULOSKELETAL:  No deformity  SKIN: Warm and dry NEUROLOGIC:  Alert and oriented x 3, non-focal PSYCHIATRIC:  Normal affect, good insight  ASSESSMENT:    1. CAD in native artery   2. Preoperative cardiovascular examination   3. Essential hypertension, benign   4. Fatigue, unspecified type    PLAN:    1.  Given his plan for surgery like to perform ischemic evaluation in this patient.  We will pursue a pharmacologic stress test.  2.  Hypertension his blood pressure is acceptable at this time. No changes will be made to his  medication regimen.  3.  Coronary artery disease continue patient on his current medication regimen.     4.  Diabetes mellitus-continue patient's current medication regimen.  5. Hyperlipidemia - continue patient on his statin medication.   The patient is in agreement with the above plan. The patient left the office in stable condition.  The patient will follow up in 6 months.    Medication Adjustments/Labs and Tests Ordered: Current medicines are reviewed at length with the patient today.  Concerns regarding medicines are outlined above.  Orders Placed This Encounter  Procedures  . Basic metabolic panel  . Magnesium  . MYOCARDIAL PERFUSION IMAGING   No orders of the defined types were placed in this encounter.   Patient Instructions  Medication Instructions:  Your physician recommends that you continue on your current medications as directed. Please refer to the Current Medication list given to you today.  *If you need a refill on your cardiac medications before your next appointment, please call your pharmacy*   Lab Work: Bmet, Engineer, materials- Today   If you have labs (blood work) drawn today and your tests are completely normal, you will receive your results only by: Marland Kitchen MyChart Message (if you have MyChart) OR . A paper copy in the mail If you have any lab test that is abnormal or we need to change your treatment, we will call you to review the results.   Testing/Procedures: Your physician has requested that you have a lexiscan myoview. For further information please visit [email or web address]. Please follow instruction sheet, as given.     Follow-Up: At [facility], you and your health needs are our priority.  As part of our continuing mission to provide you with exceptional heart care, we have created designated Provider Care Teams.  These Care Teams include your primary Cardiologist (physician) and Advanced Practice Providers (APPs -  Physician Assistants and Nurse  Practitioners) who all work together to provide you with the care you need, when you need it.  We recommend signing up for the patient portal called "MyChart".  Sign up information is provided on this After Visit Summary.  MyChart is used to connect with patients for Virtual Visits (Telemedicine).  Patients are able to view lab/test results, encounter notes, upcoming appointments, etc.  Non-urgent messages can be sent to your provider as well.   To learn more about what you can do with MyChart, go to [email or web address].    Your next appointment:   6 month(s)  The format for your next appointment:   In Person  Provider:   Berniece Salines, DO   Other Instructions None      Adopting a Healthy Lifestyle.  Know what a healthy weight is for you (roughly BMI <25) and aim to maintain this   Aim for 7+ servings of fruits and vegetables daily   65-80+ fluid ounces of water or unsweet tea for healthy kidneys   Limit to max 1 drink of alcohol per day; avoid smoking/tobacco   Limit animal fats in diet for cholesterol and heart health - choose grass fed whenever available   Avoid highly processed foods, and foods high in saturated/trans fats   Aim for low stress - take time to unwind and care for your mental health   Aim for 150 min of moderate intensity exercise weekly for heart health, and weights twice weekly for bone health   Aim for 7-9 hours of sleep daily   When it comes to diets, agreement about the perfect plan isnt  easy to find, even among the experts. Experts at the [place] developed an idea known as the Healthy Eating Plate. Just imagine a plate divided into logical, healthy portions.   The emphasis is on diet quality:   Load up on vegetables and fruits - one-half of your plate: Aim for color and variety, and remember that potatoes dont count.   Go for whole grains - one-quarter of your plate: Whole wheat, barley, wheat berries, quinoa, oats,  brown rice, and foods made with them. If you want pasta, go with whole wheat pasta.   Protein power - one-quarter of your plate: Fish, chicken, beans, and nuts are all healthy, versatile protein sources. Limit red meat.   The diet, however, does go beyond the plate, offering a few other suggestions.   Use healthy plant oils, such as olive, canola, soy, corn, sunflower and peanut. Check the labels, and avoid partially hydrogenated oil, which have unhealthy trans fats.   If youre thirsty, drink water. Coffee and tea are good in moderation, but skip sugary drinks and limit milk and dairy products to one or two daily servings.   The type of carbohydrate in the diet is more important than the amount. Some sources of carbohydrates, such as vegetables, fruits, whole grains, and beans-are healthier than others.   Finally, stay active  Signed, Berniece Salines, DO  09/06/2019 12:05 PM    [place]

## 2019-09-06 NOTE — Patient Instructions (Signed)
Medication Instructions:  Your physician recommends that you continue on your current medications as directed. Please refer to the Current Medication list given to you today.  *If you need a refill on your cardiac medications before your next appointment, please call your pharmacy*   Lab Work: Bmet, Engineer, materials- Today   If you have labs (blood work) drawn today and your tests are completely normal, you will receive your results only by: Marland Kitchen MyChart Message (if you have MyChart) OR . A paper copy in the mail If you have any lab test that is abnormal or we need to change your treatment, we will call you to review the results.   Testing/Procedures: Your physician has requested that you have a lexiscan myoview. For further information please visit [email or web address]. Please follow instruction sheet, as given.     Follow-Up: At [facility], you and your health needs are our priority.  As part of our continuing mission to provide you with exceptional heart care, we have created designated Provider Care Teams.  These Care Teams include your primary Cardiologist (physician) and Advanced Practice Providers (APPs -  Physician Assistants and Nurse Practitioners) who all work together to provide you with the care you need, when you need it.  We recommend signing up for the patient portal called "MyChart".  Sign up information is provided on this After Visit Summary.  MyChart is used to connect with patients for Virtual Visits (Telemedicine).  Patients are able to view lab/test results, encounter notes, upcoming appointments, etc.  Non-urgent messages can be sent to your provider as well.   To learn more about what you can do with MyChart, go to [email or web address].    Your next appointment:   6 month(s)  The format for your next appointment:   In Person  Provider:   Berniece Salines, DO   Other Instructions None

## 2019-09-07 LAB — BASIC METABOLIC PANEL
BUN/Creatinine Ratio: 22 (ref 10–24)
BUN: 14 mg/dL (ref 8–27)
CO2: 23 mmol/L (ref 20–29)
Calcium: 9.7 mg/dL (ref 8.6–10.2)
Chloride: 99 mmol/L (ref 96–106)
Creatinine, Ser: 0.65 mg/dL — ABNORMAL LOW (ref 0.76–1.27)
GFR calc Af Amer: 113 mL/min/{1.73_m2} (ref >59)
GFR calc non Af Amer: 98 mL/min/{1.73_m2} (ref >59)
Glucose: 104 mg/dL — ABNORMAL HIGH (ref 65–99)
Potassium: 4.5 mmol/L (ref 3.5–5.2)
Sodium: 136 mmol/L (ref 134–144)

## 2019-09-07 LAB — MAGNESIUM: Magnesium: 1.7 mg/dL (ref 1.6–2.3)

## 2019-09-08 ENCOUNTER — Telehealth: Payer: Self-pay

## 2019-09-08 NOTE — Telephone Encounter (Signed)
-----   Message from Berniece Salines, DO sent at 09/08/2019 12:40 AM EDT ----- Normal labs

## 2019-09-08 NOTE — Telephone Encounter (Signed)
Spoke with patient regarding results.  Patient verbalizes understanding and is agreeable to plan of care. Advised patient to call back with any issues or concerns.  

## 2019-09-14 ENCOUNTER — Telehealth: Payer: Self-pay

## 2019-09-14 ENCOUNTER — Telehealth (HOSPITAL_COMMUNITY): Payer: Self-pay | Admitting: *Deleted

## 2019-09-14 NOTE — Telephone Encounter (Signed)
Spoke to the patient and let him know that he was scheduled for the stress test because of the surgery that he is planning to have at some point. He does not know exactly when this surgery will be performed. He verbalizes understanding and states that he will see [place] on the 10th for his surgery. No other issues or concerns were noted.    Encouraged patient to call back with any questions or concerns.

## 2019-09-14 NOTE — Telephone Encounter (Signed)
Patient given detailed instructions per Myocardial Perfusion Study Information Sheet for the test on 09/22/19 at 7:45. Patient notified to arrive 15 minutes early and that it is imperative to arrive on time for appointment to keep from having the test rescheduled.  If you need to cancel or reschedule your appointment, please call the office within 24 hours of your appointment. . Patient verbalized understanding.[name]

## 2019-09-22 ENCOUNTER — Other Ambulatory Visit: Payer: Self-pay

## 2019-09-22 ENCOUNTER — Ambulatory Visit (INDEPENDENT_AMBULATORY_CARE_PROVIDER_SITE_OTHER): Payer: Medicare Other

## 2019-09-22 DIAGNOSIS — I2510 Atherosclerotic heart disease of native coronary artery without angina pectoris: Secondary | ICD-10-CM

## 2019-09-22 LAB — MYOCARDIAL PERFUSION IMAGING
LV dias vol: 76 mL (ref 62–150)
LV sys vol: 25 mL
Peak HR: 77 {beats}/min
Rest HR: 56 {beats}/min
SDS: 2
SRS: 4
SSS: 6
TID: 0.93

## 2019-09-22 MED ORDER — TECHNETIUM TC 99M TETROFOSMIN IV KIT
31.1000 | PACK | Freq: Once | INTRAVENOUS | Status: AC | PRN
  Administered 2019-09-22: 31.1 via INTRAVENOUS

## 2019-09-22 MED ORDER — TECHNETIUM TC 99M TETROFOSMIN IV KIT
10.2000 | PACK | Freq: Once | INTRAVENOUS | Status: AC | PRN
  Administered 2019-09-22: 10.2 via INTRAVENOUS

## 2019-09-22 MED ORDER — REGADENOSON 0.4 MG/5ML IV SOLN
0.4000 mg | Freq: Once | INTRAVENOUS | Status: AC
  Administered 2019-09-22: 0.4 mg via INTRAVENOUS

## 2019-09-23 ENCOUNTER — Ambulatory Visit (INDEPENDENT_AMBULATORY_CARE_PROVIDER_SITE_OTHER): Payer: Medicare Other | Admitting: Cardiology

## 2019-09-23 ENCOUNTER — Encounter: Payer: Self-pay | Admitting: Cardiology

## 2019-09-23 VITALS — BP 152/58 | HR 65 | Ht 69.0 in | Wt 231.4 lb

## 2019-09-23 DIAGNOSIS — E782 Mixed hyperlipidemia: Secondary | ICD-10-CM | POA: Diagnosis not present

## 2019-09-23 DIAGNOSIS — I2510 Atherosclerotic heart disease of native coronary artery without angina pectoris: Secondary | ICD-10-CM

## 2019-09-23 DIAGNOSIS — E118 Type 2 diabetes mellitus with unspecified complications: Secondary | ICD-10-CM

## 2019-09-23 DIAGNOSIS — I10 Essential (primary) hypertension: Secondary | ICD-10-CM

## 2019-09-23 DIAGNOSIS — R9439 Abnormal result of other cardiovascular function study: Secondary | ICD-10-CM

## 2019-09-23 SURGERY — PACEMAKER IMPLANT

## 2019-09-23 NOTE — H&P (View-Only) (Signed)
Cardiology Office Note:    Date:  09/23/2019   ID:  [name], DOB 12/12/1945, MRN [id number]  PCP:  Cyndi Bender, PA-C  Cardiologist:  Berniece Salines, DO  Electrophysiologist:  None   Referring MD: Cyndi Bender, PA-C   Chief Complaint  Patient presents with  . Follow-up    Abnormal Stress Test     History of Present Illness:    Joel Kestenbaum Swiftis a 74 y.o.malewith a hx of CAD s/p LAD stent 08/2011, hypertension, hyperlipidemia, diabetes mellitus, and obesity.  I saw the patient on Sep 06, 2019 at that time he told me he was fine yesterday, given his decrease activity for soliciting symptoms and significant high risk factor we pursued a pharmacologic nuclear stress test.   He did have his nuclear stress test which was reported to be abnormal.  Here today for follow-up visit.  His daughter [name] is here with him.  He tells me that he mostly has also recently has been experiencing significant palpitations. She notes that these palpitations are everyday and from her prospective bothersome.  No other complaints at this time.  Past Medical History:  Diagnosis Date  . Anxiety   . Arthritis   . 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[place]) 03/14/2013  . Atypical chest pain 12/28/2013  . Chest pain 07/30/2011   1999 Cath - <50% stenosis 2008 NUC - low risk   . Chronic back pain    herniated disc  . Coronary artery disease    takes Plavix and ASA daily  . Depression    takes Prozac daily  . Diabetes mellitus type 2, controlled, with complications ([place]) 9/37/1696  . Diverticulitis    hx of  . Dizziness    occasionally and states Dr.Smith is aware  . DJD (degenerative joint disease)   . Essential hypertension, benign 07/30/2011  . GERD (gastroesophageal reflux disease)    takes an OTC antacid  . Gout    yrs ago and doesn't take any meds  . Hard of hearing    wears hearing aids  . Headache(784.0)    occasionally  . History of colon  polyps    benign  . History of kidney stones   . HNP (herniated nucleus pulposus), lumbar 06/14/2014  . Hyperlipidemia    takes Atorvastatin daily  . Hypertension    takes Amlodipine,Losartan,and Coreg daily  . Hypothyroidism    takes Synthroid daily  . IBS (irritable bowel syndrome)   . Obesity, unspecified 07/30/2011  . Shortness of breath dyspnea    with exertion  . Tuberculosis    Patient denies Tb  . Type II diabetes mellitus (HCC)    takes Metformin daily  . Unstable angina ([place]) 12/27/2013  . Weakness    numbness and tingling in left leg    Past Surgical History:  Procedure Laterality Date  . ANTERIOR CERVICAL DECOMP/DISCECTOMY FUSION  04/2007   C4-5; C6-7  . BACK SURGERY     "3 times; last time was screws in lower back 10/2008"  . CHOLECYSTECTOMY  1970's  . COLONOSCOPY    . CORONARY ANGIOPLASTY  1999/2013   1 stent  . CYSTOSCOPY    . LEFT HEART CATHETERIZATION WITH CORONARY ANGIOGRAM N/A 08/21/2011   Procedure: LEFT HEART CATHETERIZATION WITH CORONARY ANGIOGRAM;  Surgeon: Sinclair Grooms, MD;  Location: [facility] CATH LAB;  Service: Cardiovascular;  Laterality: N/A;  . LITHOTRIPSY    . LUMBAR LAMINECTOMY/DECOMPRESSION MICRODISCECTOMY Left 06/14/2014   Procedure: LEFT  LUMBAR THREE-FOUR LANINOTOMY AND MICRODISKECTOMY.;  Surgeon: Hosie Spangle, MD;  Location: [place] NEURO ORS;  Service: Neurosurgery;  Laterality: Left;  left  . PCI  5/09/2-13   LAD  . RADIOLOGY WITH ANESTHESIA N/A 11/13/2015   Procedure: MRI OF LUMBAR SPINE W/WO CONTRAST    (RADIOLOGY WITH ANESTHESIA);  Surgeon: Medication Radiologist, MD;  Location: [place];  Service: Radiology;  Laterality: N/A;  . SHOULDER ARTHROSCOPY W/ ROTATOR CUFF REPAIR  ~ 2011   left    Current Medications: Current Meds  Medication Sig  . acetaminophen (TYLENOL) 500 MG tablet Take 1,000 mg by mouth every 6 (six) hours as needed for mild pain.   Marland Kitchen amLODipine (NORVASC) 10 MG tablet Take 10 mg by mouth daily.  Marland Kitchen ammonium lactate  (LAC-HYDRIN) 12 % lotion   . aspirin 81 MG tablet Take 81 mg by mouth daily.  Marland Kitchen atorvastatin (LIPITOR) 20 MG tablet Take 20 mg by mouth daily.  . carvedilol (COREG) 25 MG tablet Take 25 mg by mouth 2 (two) times daily with a meal.  . clopidogrel (PLAVIX) 75 MG tablet Take 75 mg by mouth daily with breakfast.  . furosemide (LASIX) 20 MG tablet Take 20 mg by mouth daily as needed.  . gabapentin (NEURONTIN) 300 MG capsule   . hydrochlorothiazide (HYDRODIURIL) 25 MG tablet Take 25 mg by mouth daily.   Marland Kitchen HYDROmorphone (DILAUDID) 2 MG tablet Take 2 mg by mouth every 4 (four) hours as needed for moderate pain or severe pain.   Marland Kitchen ibuprofen (ADVIL) 200 MG tablet Take 200 mg by mouth as needed.  Marland Kitchen levothyroxine (SYNTHROID, LEVOTHROID) 50 MCG tablet Take 50 mcg by mouth daily before breakfast.  . losartan (COZAAR) 100 MG tablet Take 100 mg by mouth daily.  . metFORMIN (GLUCOPHAGE-XR) 500 MG 24 hr tablet Take 500 mg by mouth 2 (two) times daily.   . nitroGLYCERIN (NITROSTAT) 0.4 MG SL tablet Place 0.4 mg under the tongue every 5 (five) minutes as needed. For chest pain  . sertraline (ZOLOFT) 100 MG tablet Take 100 mg by mouth daily.  . traMADol (ULTRAM) 50 MG tablet Take 50 mg by mouth every 4 (four) hours as needed.     Allergies:   Sulfa antibiotics   Social History   Socioeconomic History  . Marital status: Single    Spouse name: Not on file  . Number of children: Not on file  . Years of education: Not on file  . Highest education level: Not on file  Occupational History  . Not on file  Tobacco Use  . Smoking status: Former Research scientist (life sciences)  . Smokeless tobacco: Current User    Types: Chew  Substance and Sexual Activity  . Alcohol use: No  . Drug use: No  . Sexual activity: Never  Other Topics Concern  . Not on file  Social History Narrative  . Not on file   Social Determinants of Health   Financial Resource Strain:   . Difficulty of Paying Living Expenses:   Food Insecurity:   . Worried  About Charity fundraiser in the Last Year:   . Arboriculturist in the Last Year:   Transportation Needs:   . Film/video editor (Medical):   Marland Kitchen Lack of Transportation (Non-Medical):   Physical Activity:   . Days of Exercise per Week:   . Minutes of Exercise per Session:   Stress:   . Feeling of Stress :   Social Connections:   . Frequency of  Communication with Friends and Family:   . Frequency of Social Gatherings with Friends and Family:   . Attends Religious Services:   . Active Member of Clubs or Organizations:   . Attends Archivist Meetings:   Marland Kitchen Marital Status:      Family History: The patient's family history includes Arthritis in his mother; Cancer in his father; Healthy in his sister and sister; Heart attack in his brother; Heart disease in his brother; Hypertension in his brother.  ROS:   Review of Systems  Constitution: Negative for decreased appetite, fever and weight gain.  HENT: Negative for congestion, ear discharge, hoarse voice and sore throat.   Eyes: Negative for discharge, redness, vision loss in right eye and visual halos.  Cardiovascular: Negative for chest pain, dyspnea on exertion, leg swelling, orthopnea and palpitations.  Respiratory: Negative for cough, hemoptysis, shortness of breath and snoring.   Endocrine: Negative for heat intolerance and polyphagia.  Hematologic/Lymphatic: Negative for bleeding problem. Does not bruise/bleed easily.  Skin: Negative for flushing, nail changes, rash and suspicious lesions.  Musculoskeletal: Negative for arthritis, joint pain, muscle cramps, myalgias, neck pain and stiffness.  Gastrointestinal: Negative for abdominal pain, bowel incontinence, diarrhea and excessive appetite.  Genitourinary: Negative for decreased libido, genital sores and incomplete emptying.  Neurological: Negative for brief paralysis, focal weakness, headaches and loss of balance.  Psychiatric/Behavioral: Negative for altered mental  status, depression and suicidal ideas.  Allergic/Immunologic: Negative for HIV exposure and persistent infections.    EKGs/Labs/Other Studies Reviewed:    The following studies were reviewed today:   EKG:  The ekg ordered today demonstrates sinus bradycardia, heart rate 50 bpm with low voltage.  Recent Labs: 09/06/2019: BUN 14; Creatinine, Ser 0.65; Magnesium 1.7; Potassium 4.5; Sodium 136  Recent Lipid Panel    Component Value Date/Time   CHOL 175 07/31/2011 0352   TRIG 161 (H) 07/31/2011 0352   HDL 38 (L) 07/31/2011 0352   CHOLHDL 4.6 07/31/2011 0352   VLDL 32 07/31/2011 0352   LDLCALC 105 (H) 07/31/2011 0352    Physical Exam:    VS:  BP (!) 152/58 (BP Location: Left Arm, Patient Position: Sitting, Cuff Size: Normal)   Pulse 65   Ht 5\' 9"  (1.753 m)   Wt 231 lb 6.4 oz (105 kg)   SpO2 94%   BMI 34.17 kg/m     Wt Readings from Last 3 Encounters:  09/23/19 231 lb 6.4 oz (105 kg)  09/22/19 228 lb (103.4 kg)  09/06/19 228 lb 6.4 oz (103.6 kg)     GEN: Well nourished, well developed in no acute distress HEENT: Normal NECK: No JVD; No carotid bruits LYMPHATICS: No lymphadenopathy CARDIAC: S1S2 noted,RRR, no murmurs, rubs, gallops RESPIRATORY:  Clear to auscultation without rales, wheezing or rhonchi  ABDOMEN: Soft, non-tender, non-distended, +bowel sounds, no guarding. EXTREMITIES: No edema, No cyanosis, no clubbing MUSCULOSKELETAL:  No deformity  SKIN: Warm and dry NEUROLOGIC:  Alert and oriented x 3, non-focal PSYCHIATRIC:  Normal affect, good insight  ASSESSMENT:    1. Essential hypertension, benign   2. CAD in native artery   3. Controlled type 2 diabetes mellitus with complication, without long-term current use of insulin ([place])   4. Mixed hyperlipidemia   5. Abnormal stress test    PLAN:      His preoperative stress test was abnormal.  Given his risk factors as well as his abnormal stress test the most appropriate step is a left heart catheterization.  I  did speak  with the patient and his daughter he is agreeable to proceed with this.  The patient understands that risks include but are not limited to stroke (1 in 1000), death (1 in 57), kidney failure [usually temporary] (1 in 500), bleeding (1 in 200), allergic reaction [possibly serious] (1 in 200), and agrees to proceed.  He will remain on his aspirin, Plavix as well as his Lipitor.  His blood pressure slightly high in the office today however this is unusual as his prior blood pressures have been within target of less than 130/80.  Have asked the patient to monitor this at home and if needed I will adjust his antihypertensive medication.  For now no change.  Hyperlipidemia continue patient on atorvastatin 20 mg daily.  Diabetes mellitus-continue treatment per primary provider.  Obesity-the patient understands the need to lose weight with diet and exercise. We have discussed specific strategies for this.  Tobacco use-patient advised.  Palpitations-he will need to wear a monitor, this will be placed after his heart catheterization.   The patient is in agreement with the above plan. The patient left the office in stable condition.  The patient will follow up in 1 month or sooner if needed.   Medication Adjustments/Labs and Tests Ordered: Current medicines are reviewed at length with the patient today.  Concerns regarding medicines are outlined above.  Orders Placed This Encounter  Procedures  . Basic metabolic panel  . CBC with Differential/Platelet  . Magnesium  . EKG 12-Lead   No orders of the defined types were placed in this encounter.   Patient Instructions  Medication Instructions:  No medication changes. *If you need a refill on your cardiac medications before your next appointment, please call your pharmacy*   Lab Work: Your physician recommends that you have labs today for your upcoming cath. You had a BMET, Magnesium and  CBC.  If you have labs (blood work) drawn  today and your tests are completely normal, you will receive your results only by: Marland Kitchen MyChart Message (if you have MyChart) OR . A paper copy in the mail If you have any lab test that is abnormal or we need to change your treatment, we will call you to review the results.   Testing/Procedures:    [place] [address] Dept: [phone] Loc: [phone]  GERY SABEDRA  09/23/2019  You are scheduled for a Cardiac Catheterization on Friday, June 18 with Dr. Larae Grooms.  1. Please arrive at the [facility] (Main Entrance A) at [facility]: [address] [place], [place] [address] at 5:30 AM (This time is two hours before your procedure to ensure your preparation). Free valet parking service is available.   Special note: Every effort is made to have your procedure done on time. Please understand that emergencies sometimes delay scheduled procedures.  2. Diet: Do not eat solid foods after midnight.  The patient may have clear liquids until 5am upon the day of the procedure.  3. Labs: You had labs done today.  4. Medication instructions in preparation for your procedure:   Contrast Allergy: No  Stop taking, HTCZ (Hydrochlorothiazide) Friday, June 18, Stop taking, Lasix (Furosemide)  Friday, June 18, Stop taking, Cozaar (Losartan) Friday, June 18, Stop taking, Advil or Motrin (Ibuprofen) Friday, June 18, Do not take Diabetes Med Glucophage (Metformin) on the day of the procedure and HOLD 48 HOURS AFTER THE PROCEDURE.  On the  morning of your procedure, take your Plavix/Clopidogrel and any morning medicines NOT listed above.  You may use sips of water.  5. Plan for one night stay--bring personal belongings. 6. Bring a current list of your medications and current insurance cards. 7. You MUST have a responsible person to drive you home. 8. Someone MUST be with you  the first 24 hours after you arrive home or your discharge will be delayed. 9. Please wear clothes that are easy to get on and off and wear slip-on shoes.  Thank you for allowing [place] to care for you!   -- [facility]    Follow-Up: At [facility], you and your health needs are our priority.  As part of our continuing mission to provide you with exceptional heart care, we have created designated Provider Care Teams.  These Care Teams include your primary Cardiologist (physician) and Advanced Practice Providers (APPs -  Physician Assistants and Nurse Practitioners) who all work together to provide you with the care you need, when you need it.  We recommend signing up for the patient portal called "MyChart".  Sign up information is provided on this After Visit Summary.  MyChart is used to connect with patients for Virtual Visits (Telemedicine).  Patients are able to view lab/test results, encounter notes, upcoming appointments, etc.  Non-urgent messages can be sent to your provider as well.   To learn more about what you can do with MyChart, go to [email or web address].    Your next appointment:   1 month(s)  The format for your next appointment:   In Person  Provider:   Berniece Salines, DO   Other Instructions  Coronary Angioplasty  Coronary angioplasty is a procedure to widen a narrowed or blocked blood vessel of the heart (coronary artery). The artery is usually blocked by cholesterol buildup (plaques) in the lining of the artery walls. When a vessel in the heart becomes partially blocked, there is decreased blood flow to that area. This may lead to chest pain or a heart attack (myocardial infarction). Tell a health care provider about:  Any allergies you have, including allergies to shellfish or contrast dye.  All medicines you are taking, including vitamins, herbs, eye drops, creams, and over-the-counter medicines.  Any problems you or family  members have had with anesthetic medicines.  Any blood disorders you have.  Any surgeries you have had.  Any medical conditions you have.  Whether you are pregnant or may be pregnant. What are the risks? Generally, this is a safe procedure. However, problems may occur, including:  Damage to other structures or organs. This may include damage to blood vessels, leading to rupture or bleeding.  Infection, bleeding, or bruising at the site where a small, thin tube (catheter) will be inserted.  Allergic reaction to the dye or contrast that is used.  Kidney damage from the dye or contrast that is used.  Blood clots that can lead to a stroke or heart attack.  Bleeding into the abdomen (retroperitoneal bleeding). What happens before the procedure? Staying hydrated Follow instructions from your health care provider about hydration, which may include:  Up to 2 hours before the procedure - you may continue to drink clear liquids, such as water, clear fruit juice, black coffee, and plain tea. Eating and drinking restrictions Follow instructions from your health care provider about eating and drinking, which may include:  8 hours before the procedure - stop eating heavy meals or foods such as meat, fried foods,  or fatty foods.  6 hours before the procedure - stop eating light meals or foods, such as toast or cereal.  2 hours before the procedure - stop drinking clear liquids. Medicines  Ask your health care provider about: ? Changing or stopping your regular medicines. This is especially important if you are taking diabetes medicines or blood thinners. ? Whether aspirin is recommended before this procedure.  Ask your health care provider if you can take a sip of water with any approved medicines the morning of the procedure. General instructions  Plan to have someone take you home from the hospital or clinic.  If you will be going home right after the procedure, plan to have someone  with you for 24 hours. What happens during the procedure?  To reduce your risk of infection: ? Your health care team will wash or sanitize their hands. ? A germ-killing solution (antiseptic) will be used to wash the area where the catheter will be inserted. Hair may be removed from this area. The catheter may be inserted in:  Your groin area. This is the most common area.  The fold of your arm, near your elbow.  Your wrist.  An IV tube will be inserted into one of your veins.  You will be given a medicine to help you relax (sedative).  You will be given a medicine to numb the area where the catheter will be inserted (local anesthetic).  The catheter will be inserted into an artery.  The catheter will be guided to the narrowed or blocked artery using a type of X-ray (fluoroscopy).  When the catheter is near the heart, dye will be injected that makes the narrowing or blockage visible on the X-ray.  Once the catheter is positioned at the narrowed or blocked portion of the blood vessel, a balloon will be inflated to make the artery wider. Expanding the balloon will crush the plaques into the wall of the vessel and improve the blood flow.  The artery may be made wider by removing plaques using a drill, laser, or other tools.  When the blood flow is better, the balloon will be deflated and the catheter will be removed.  A stent may be placed. This is common in this procedure.  After the catheter is removed, a special dressing will be placed over the insertion site. What happens after the procedure?  You will need to keep the area still for a few hours, or as long as directed by your health care provider. If the procedure was done in the groin, you will be instructed not to bend or cross your legs.  The insertion site will be checked often.  The pulse in your feet or wrist will be checked often.  Additional blood tests, X-rays, and an electrocardiogram (ECG) may be done.  Do not  drive for 24 hours if you were given a sedative. This information is not intended to replace advice given to you by your health care provider. Make sure you discuss any questions you have with your health care provider. Document Revised: 03/13/2017 Document Reviewed: 12/28/2015 Elsevier Patient Education  [place].      Adopting a Healthy Lifestyle.  Know what a healthy weight is for you (roughly BMI <25) and aim to maintain this   Aim for 7+ servings of fruits and vegetables daily   65-80+ fluid ounces of water or unsweet tea for healthy kidneys   Limit to max 1 drink of alcohol per day; avoid smoking/tobacco  Limit animal fats in diet for cholesterol and heart health - choose grass fed whenever available   Avoid highly processed foods, and foods high in saturated/trans fats   Aim for low stress - take time to unwind and care for your mental health   Aim for 150 min of moderate intensity exercise weekly for heart health, and weights twice weekly for bone health   Aim for 7-9 hours of sleep daily   When it comes to diets, agreement about the perfect plan isnt easy to find, even among the experts. Experts at the [employer] developed an idea known as the Healthy Eating Plate. Just imagine a plate divided into logical, healthy portions.   The emphasis is on diet quality:   Load up on vegetables and fruits - one-half of your plate: Aim for color and variety, and remember that potatoes dont count.   Go for whole grains - one-quarter of your plate: Whole wheat, barley, wheat berries, quinoa, oats, brown rice, and foods made with them. If you want pasta, go with whole wheat pasta.   Protein power - one-quarter of your plate: Fish, chicken, beans, and nuts are all healthy, versatile protein sources. Limit red meat.   The diet, however, does go beyond the plate, offering a few other suggestions.   Use healthy plant oils, such as olive, canola, soy,  corn, sunflower and peanut. Check the labels, and avoid partially hydrogenated oil, which have unhealthy trans fats.   If youre thirsty, drink water. Coffee and tea are good in moderation, but skip sugary drinks and limit milk and dairy products to one or two daily servings.   The type of carbohydrate in the diet is more important than the amount. Some sources of carbohydrates, such as vegetables, fruits, whole grains, and beans-are healthier than others.   Finally, stay active  Signed, Berniece Salines, DO  09/23/2019 9:14 PM    [facility]

## 2019-09-23 NOTE — Progress Notes (Signed)
Cardiology Office Note:    Date:  09/23/2019   ID:  [name], DOB 07-Apr-1946, MRN [id number]  PCP:  Joel Bender, PA-C  Cardiologist:  Joel Salines, DO  Electrophysiologist:  None   Referring MD: Joel Bender, PA-C   Chief Complaint  Patient presents with  . Follow-up    Abnormal Stress Test     History of Present Illness:    Joel Colocho Swiftis a 74 y.o.malewith a hx of CAD s/p LAD stent 08/2011, hypertension, hyperlipidemia, diabetes mellitus, and obesity.  I saw the patient on Sep 06, 2019 at that time he told me he was fine yesterday, given his decrease activity for soliciting symptoms and significant high risk factor we pursued a pharmacologic nuclear stress test.   He did have his nuclear stress test which was reported to be abnormal.  Here today for follow-up visit.  His daughter [name] is here with him.  He tells me that he mostly has also recently has been experiencing significant palpitations. She notes that these palpitations are everyday and from her prospective bothersome.  No other complaints at this time.  Past Medical History:  Diagnosis Date  . Anxiety   . Arthritis   . 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[place]) 03/14/2013  . Atypical chest pain 12/28/2013  . Chest pain 07/30/2011   1999 Cath - <50% stenosis 2008 NUC - low risk   . Chronic back pain    herniated disc  . Coronary artery disease    takes Plavix and ASA daily  . Depression    takes Prozac daily  . Diabetes mellitus type 2, controlled, with complications ([place]) 4/00/8676  . Diverticulitis    hx of  . Dizziness    occasionally and states Dr.Smith is aware  . DJD (degenerative joint disease)   . Essential hypertension, benign 07/30/2011  . GERD (gastroesophageal reflux disease)    takes an OTC antacid  . Gout    yrs ago and doesn't take any meds  . Hard of hearing    wears hearing aids  . Headache(784.0)    occasionally  . History of colon  polyps    benign  . History of kidney stones   . HNP (herniated nucleus pulposus), lumbar 06/14/2014  . Hyperlipidemia    takes Atorvastatin daily  . Hypertension    takes Amlodipine,Losartan,and Coreg daily  . Hypothyroidism    takes Synthroid daily  . IBS (irritable bowel syndrome)   . Obesity, unspecified 07/30/2011  . Shortness of breath dyspnea    with exertion  . Tuberculosis    Patient denies Tb  . Type II diabetes mellitus (HCC)    takes Metformin daily  . Unstable angina ([place]) 12/27/2013  . Weakness    numbness and tingling in left leg    Past Surgical History:  Procedure Laterality Date  . ANTERIOR CERVICAL DECOMP/DISCECTOMY FUSION  04/2007   C4-5; C6-7  . BACK SURGERY     "3 times; last time was screws in lower back 10/2008"  . CHOLECYSTECTOMY  1970's  . COLONOSCOPY    . CORONARY ANGIOPLASTY  1999/2013   1 stent  . CYSTOSCOPY    . LEFT HEART CATHETERIZATION WITH CORONARY ANGIOGRAM N/A 08/21/2011   Procedure: LEFT HEART CATHETERIZATION WITH CORONARY ANGIOGRAM;  Surgeon: Sinclair Grooms, MD;  [facility]: [facility] CATH LAB;  Service: Cardiovascular;  Laterality: N/A;  . LITHOTRIPSY    . LUMBAR LAMINECTOMY/DECOMPRESSION MICRODISCECTOMY Left 06/14/2014   Procedure: LEFT  LUMBAR THREE-FOUR LANINOTOMY AND MICRODISKECTOMY.;  Surgeon: Hosie Spangle, MD;  Location: [facility] NEURO ORS;  Service: Neurosurgery;  Laterality: Left;  left  . PCI  5/09/2-13   LAD  . RADIOLOGY WITH ANESTHESIA N/A 11/13/2015   Procedure: MRI OF LUMBAR SPINE W/WO CONTRAST    (RADIOLOGY WITH ANESTHESIA);  Surgeon: Medication Radiologist, MD;  Location: [facility];  Service: Radiology;  Laterality: N/A;  . SHOULDER ARTHROSCOPY W/ ROTATOR CUFF REPAIR  ~ 2011   left    Current Medications: Current Meds  Medication Sig  . acetaminophen (TYLENOL) 500 MG tablet Take 1,000 mg by mouth every 6 (six) hours as needed for mild pain.   Marland Kitchen amLODipine (NORVASC) 10 MG tablet Take 10 mg by mouth daily.  Marland Kitchen ammonium lactate  (LAC-HYDRIN) 12 % lotion   . aspirin 81 MG tablet Take 81 mg by mouth daily.  Marland Kitchen atorvastatin (LIPITOR) 20 MG tablet Take 20 mg by mouth daily.  . carvedilol (COREG) 25 MG tablet Take 25 mg by mouth 2 (two) times daily with a meal.  . clopidogrel (PLAVIX) 75 MG tablet Take 75 mg by mouth daily with breakfast.  . furosemide (LASIX) 20 MG tablet Take 20 mg by mouth daily as needed.  . gabapentin (NEURONTIN) 300 MG capsule   . hydrochlorothiazide (HYDRODIURIL) 25 MG tablet Take 25 mg by mouth daily.   Marland Kitchen HYDROmorphone (DILAUDID) 2 MG tablet Take 2 mg by mouth every 4 (four) hours as needed for moderate pain or severe pain.   Marland Kitchen ibuprofen (ADVIL) 200 MG tablet Take 200 mg by mouth as needed.  Marland Kitchen levothyroxine (SYNTHROID, LEVOTHROID) 50 MCG tablet Take 50 mcg by mouth daily before breakfast.  . losartan (COZAAR) 100 MG tablet Take 100 mg by mouth daily.  . metFORMIN (GLUCOPHAGE-XR) 500 MG 24 hr tablet Take 500 mg by mouth 2 (two) times daily.   . nitroGLYCERIN (NITROSTAT) 0.4 MG SL tablet Place 0.4 mg under the tongue every 5 (five) minutes as needed. For chest pain  . sertraline (ZOLOFT) 100 MG tablet Take 100 mg by mouth daily.  . traMADol (ULTRAM) 50 MG tablet Take 50 mg by mouth every 4 (four) hours as needed.     Allergies:   Sulfa antibiotics   Social History   Socioeconomic History  . Marital status: Single    Spouse name: Not on file  . Number of children: Not on file  . Years of education: Not on file  . Highest education level: Not on file  Occupational History  . Not on file  Tobacco Use  . Smoking status: Former Research scientist (life sciences)  . Smokeless tobacco: Current User    Types: Chew  Substance and Sexual Activity  . Alcohol use: No  . Drug use: No  . Sexual activity: Never  Other Topics Concern  . Not on file  Social History Narrative  . Not on file   Social Determinants of Health   Financial Resource Strain:   . Difficulty of Paying Living Expenses:   Food Insecurity:   . Worried  About Charity fundraiser in the Last Year:   . Arboriculturist in the Last Year:   Transportation Needs:   . Film/video editor (Medical):   Marland Kitchen Lack of Transportation (Non-Medical):   Physical Activity:   . Days of Exercise per Week:   . Minutes of Exercise per Session:   Stress:   . Feeling of Stress :   Social Connections:   . Frequency of  Communication with Friends and Family:   . Frequency of Social Gatherings with Friends and Family:   . Attends Religious Services:   . Active Member of Clubs or Organizations:   . Attends Archivist Meetings:   Marland Kitchen Marital Status:      Family History: The patient's family history includes Arthritis in his mother; Cancer in his father; Healthy in his sister and sister; Heart attack in his brother; Heart disease in his brother; Hypertension in his brother.  ROS:   Review of Systems  Constitution: Negative for decreased appetite, fever and weight gain.  HENT: Negative for congestion, ear discharge, hoarse voice and sore throat.   Eyes: Negative for discharge, redness, vision loss in right eye and visual halos.  Cardiovascular: Negative for chest pain, dyspnea on exertion, leg swelling, orthopnea and palpitations.  Respiratory: Negative for cough, hemoptysis, shortness of breath and snoring.   Endocrine: Negative for heat intolerance and polyphagia.  Hematologic/Lymphatic: Negative for bleeding problem. Does not bruise/bleed easily.  Skin: Negative for flushing, nail changes, rash and suspicious lesions.  Musculoskeletal: Negative for arthritis, joint pain, muscle cramps, myalgias, neck pain and stiffness.  Gastrointestinal: Negative for abdominal pain, bowel incontinence, diarrhea and excessive appetite.  Genitourinary: Negative for decreased libido, genital sores and incomplete emptying.  Neurological: Negative for brief paralysis, focal weakness, headaches and loss of balance.  Psychiatric/Behavioral: Negative for altered mental  status, depression and suicidal ideas.  Allergic/Immunologic: Negative for HIV exposure and persistent infections.    EKGs/Labs/Other Studies Reviewed:    The following studies were reviewed today:   EKG:  The ekg ordered today demonstrates sinus bradycardia, heart rate 50 bpm with low voltage.  Recent Labs: 09/06/2019: BUN 14; Creatinine, Ser 0.65; Magnesium 1.7; Potassium 4.5; Sodium 136  Recent Lipid Panel    Component Value Date/Time   CHOL 175 07/31/2011 0352   TRIG 161 (H) 07/31/2011 0352   HDL 38 (L) 07/31/2011 0352   CHOLHDL 4.6 07/31/2011 0352   VLDL 32 07/31/2011 0352   LDLCALC 105 (H) 07/31/2011 0352    Physical Exam:    VS:  BP (!) 152/58 (BP Location: Left Arm, Patient Position: Sitting, Cuff Size: Normal)   Pulse 65   Ht 5\' 9"  (1.753 m)   Wt 231 lb 6.4 oz (105 kg)   SpO2 94%   BMI 34.17 kg/m     Wt Readings from Last 3 Encounters:  09/23/19 231 lb 6.4 oz (105 kg)  09/22/19 228 lb (103.4 kg)  09/06/19 228 lb 6.4 oz (103.6 kg)     GEN: Well nourished, well developed in no acute distress HEENT: Normal NECK: No JVD; No carotid bruits LYMPHATICS: No lymphadenopathy CARDIAC: S1S2 noted,RRR, no murmurs, rubs, gallops RESPIRATORY:  Clear to auscultation without rales, wheezing or rhonchi  ABDOMEN: Soft, non-tender, non-distended, +bowel sounds, no guarding. EXTREMITIES: No edema, No cyanosis, no clubbing MUSCULOSKELETAL:  No deformity  SKIN: Warm and dry NEUROLOGIC:  Alert and oriented x 3, non-focal PSYCHIATRIC:  Normal affect, good insight  ASSESSMENT:    1. Essential hypertension, benign   2. CAD in native artery   3. Controlled type 2 diabetes mellitus with complication, without long-term current use of insulin ([place])   4. Mixed hyperlipidemia   5. Abnormal stress test    PLAN:      His preoperative stress test was abnormal.  Given his risk factors as well as his abnormal stress test the most appropriate step is a left heart catheterization.  I  did speak  with the patient and his daughter he is agreeable to proceed with this.  The patient understands that risks include but are not limited to stroke (1 in 1000), death (1 in 90), kidney failure [usually temporary] (1 in 500), bleeding (1 in 200), allergic reaction [possibly serious] (1 in 200), and agrees to proceed.  He will remain on his aspirin, Plavix as well as his Lipitor.  His blood pressure slightly high in the office today however this is unusual as his prior blood pressures have been within target of less than 130/80.  Have asked the patient to monitor this at home and if needed I will adjust his antihypertensive medication.  For now no change.  Hyperlipidemia continue patient on atorvastatin 20 mg daily.  Diabetes mellitus-continue treatment per primary provider.  Obesity-the patient understands the need to Harris weight with diet and exercise. We have discussed specific strategies for this.  Tobacco use-patient advised.  Palpitations-he will need to wear a monitor, this will be placed after his heart catheterization.   The patient is in agreement with the above plan. The patient left the office in stable condition.  The patient will follow up in 1 month or sooner if needed.   Medication Adjustments/Labs and Tests Ordered: Current medicines are reviewed at length with the patient today.  Concerns regarding medicines are outlined above.  Orders Placed This Encounter  Procedures  . Basic metabolic panel  . CBC with Differential/Platelet  . Magnesium  . EKG 12-Lead   No orders of the defined types were placed in this encounter.   Patient Instructions  Medication Instructions:  No medication changes. *If you need a refill on your cardiac medications before your next appointment, please call your pharmacy*   Lab Work: Your physician recommends that you have labs today for your upcoming cath. You had a BMET, Magnesium and  CBC.  If you have labs (blood work) drawn  today and your tests are completely normal, you will receive your results only by: Marland Kitchen MyChart Message (if you have MyChart) OR . A paper copy in the mail If you have any lab test that is abnormal or we need to change your treatment, we will call you to review the results.   Testing/Procedures:    [place] [address] Dept: [phone] Loc: [phone]  VIAAN KNIPPENBERG  09/23/2019  You are scheduled for a Cardiac Catheterization on Friday, June 18 with Dr. Larae Grooms.  1. Please arrive at the [facility] (Main Entrance A) at [facility]: [address] [place], [place] [address] at 5:30 AM (This time is two hours before your procedure to ensure your preparation). Free valet parking service is available.   Special note: Every effort is made to have your procedure done on time. Please understand that emergencies sometimes delay scheduled procedures.  2. Diet: Do not eat solid foods after midnight.  The patient may have clear liquids until 5am upon the day of the procedure.  3. Labs: You had labs done today.  4. Medication instructions in preparation for your procedure:   Contrast Allergy: No  Stop taking, HTCZ (Hydrochlorothiazide) Friday, June 18, Stop taking, Lasix (Furosemide)  Friday, June 18, Stop taking, Cozaar (Losartan) Friday, June 18, Stop taking, Advil or Motrin (Ibuprofen) Friday, June 18, Do not take Diabetes Med Glucophage (Metformin) on the day of the procedure and HOLD 48 HOURS AFTER THE PROCEDURE.  On the  morning of your procedure, take your Plavix/Clopidogrel and any morning medicines NOT listed above.  You may use sips of water.  5. Plan for one night stay--bring personal belongings. 6. Bring a current list of your medications and current insurance cards. 7. You MUST have a responsible person to drive you home. 8. Someone MUST be with you  the first 24 hours after you arrive home or your discharge will be delayed. 9. Please wear clothes that are easy to get on and off and wear slip-on shoes.  Thank you for allowing [place] to care for you!   -- [facility]    Follow-Up: At [facility], you and your health needs are our priority.  As part of our continuing mission to provide you with exceptional heart care, we have created designated Provider Care Teams.  These Care Teams include your primary Cardiologist (physician) and Advanced Practice Providers (APPs -  Physician Assistants and Nurse Practitioners) who all work together to provide you with the care you need, when you need it.  We recommend signing up for the patient portal called "MyChart".  Sign up information is provided on this After Visit Summary.  MyChart is used to connect with patients for Virtual Visits (Telemedicine).  Patients are able to view lab/test results, encounter notes, upcoming appointments, etc.  Non-urgent messages can be sent to your provider as well.   To learn more about what you can do with MyChart, go to [email or web address].    Your next appointment:   1 month(s)  The format for your next appointment:   In Person  Provider:   Berniece Salines, DO   Other Instructions  Coronary Angioplasty  Coronary angioplasty is a procedure to widen a narrowed or blocked blood vessel of the heart (coronary artery). The artery is usually blocked by cholesterol buildup (plaques) in the lining of the artery walls. When a vessel in the heart becomes partially blocked, there is decreased blood flow to that area. This may lead to chest pain or a heart attack (myocardial infarction). Tell a health care provider about:  Any allergies you have, including allergies to shellfish or contrast dye.  All medicines you are taking, including vitamins, herbs, eye drops, creams, and over-the-counter medicines.  Any problems you or family  members have had with anesthetic medicines.  Any blood disorders you have.  Any surgeries you have had.  Any medical conditions you have.  Whether you are pregnant or may be pregnant. What are the risks? Generally, this is a safe procedure. However, problems may occur, including:  Damage to other structures or organs. This may include damage to blood vessels, leading to rupture or bleeding.  Infection, bleeding, or bruising at the site where a small, thin tube (catheter) will be inserted.  Allergic reaction to the dye or contrast that is used.  Kidney damage from the dye or contrast that is used.  Blood clots that can lead to a stroke or heart attack.  Bleeding into the abdomen (retroperitoneal bleeding). What happens before the procedure? Staying hydrated Follow instructions from your health care provider about hydration, which may include:  Up to 2 hours before the procedure - you may continue to drink clear liquids, such as water, clear fruit juice, black coffee, and plain tea. Eating and drinking restrictions Follow instructions from your health care provider about eating and drinking, which may include:  8 hours before the procedure - stop eating heavy meals or foods such as meat, fried foods,  or fatty foods.  6 hours before the procedure - stop eating light meals or foods, such as toast or cereal.  2 hours before the procedure - stop drinking clear liquids. Medicines  Ask your health care provider about: ? Changing or stopping your regular medicines. This is especially important if you are taking diabetes medicines or blood thinners. ? Whether aspirin is recommended before this procedure.  Ask your health care provider if you can take a sip of water with any approved medicines the morning of the procedure. General instructions  Plan to have someone take you home from the hospital or clinic.  If you will be going home right after the procedure, plan to have someone  with you for 24 hours. What happens during the procedure?  To reduce your risk of infection: ? Your health care team will wash or sanitize their hands. ? A germ-killing solution (antiseptic) will be used to wash the area where the catheter will be inserted. Hair may be removed from this area. The catheter may be inserted in:  Your groin area. This is the most common area.  The fold of your arm, near your elbow.  Your wrist.  An IV tube will be inserted into one of your veins.  You will be given a medicine to help you relax (sedative).  You will be given a medicine to numb the area where the catheter will be inserted (local anesthetic).  The catheter will be inserted into an artery.  The catheter will be guided to the narrowed or blocked artery using a type of X-ray (fluoroscopy).  When the catheter is near the heart, dye will be injected that makes the narrowing or blockage visible on the X-ray.  Once the catheter is positioned at the narrowed or blocked portion of the blood vessel, a balloon will be inflated to make the artery wider. Expanding the balloon will crush the plaques into the wall of the vessel and improve the blood flow.  The artery may be made wider by removing plaques using a drill, laser, or other tools.  When the blood flow is better, the balloon will be deflated and the catheter will be removed.  A stent may be placed. This is common in this procedure.  After the catheter is removed, a special dressing will be placed over the insertion site. What happens after the procedure?  You will need to keep the area still for a few hours, or as long as directed by your health care provider. If the procedure was done in the groin, you will be instructed not to bend or cross your legs.  The insertion site will be checked often.  The pulse in your feet or wrist will be checked often.  Additional blood tests, X-rays, and an electrocardiogram (ECG) may be done.  Do not  drive for 24 hours if you were given a sedative. This information is not intended to replace advice given to you by your health care provider. Make sure you discuss any questions you have with your health care provider. Document Revised: 03/13/2017 Document Reviewed: 12/28/2015 Elsevier Patient Education  [place].      Adopting a Healthy Lifestyle.  Know what a healthy weight is for you (roughly BMI <25) and aim to maintain this   Aim for 7+ servings of fruits and vegetables daily   65-80+ fluid ounces of water or unsweet tea for healthy kidneys   Limit to max 1 drink of alcohol per day; avoid smoking/tobacco  Limit animal fats in diet for cholesterol and heart health - choose grass fed whenever available   Avoid highly processed foods, and foods high in saturated/trans fats   Aim for low stress - take time to unwind and care for your mental health   Aim for 150 min of moderate intensity exercise weekly for heart health, and weights twice weekly for bone health   Aim for 7-9 hours of sleep daily   When it comes to diets, agreement about the perfect plan isnt easy to find, even among the experts. Experts at the [employer] developed an idea known as the Healthy Eating Plate. Just imagine a plate divided into logical, healthy portions.   The emphasis is on diet quality:   Load up on vegetables and fruits - one-half of your plate: Aim for color and variety, and remember that potatoes dont count.   Go for whole grains - one-quarter of your plate: Whole wheat, barley, wheat berries, quinoa, oats, brown rice, and foods made with them. If you want pasta, go with whole wheat pasta.   Protein power - one-quarter of your plate: Fish, chicken, beans, and nuts are all healthy, versatile protein sources. Limit red meat.   The diet, however, does go beyond the plate, offering a few other suggestions.   Use healthy plant oils, such as olive, canola, soy,  corn, sunflower and peanut. Check the labels, and avoid partially hydrogenated oil, which have unhealthy trans fats.   If youre thirsty, drink water. Coffee and tea are good in moderation, but skip sugary drinks and limit milk and dairy products to one or two daily servings.   The type of carbohydrate in the diet is more important than the amount. Some sources of carbohydrates, such as vegetables, fruits, whole grains, and beans-are healthier than others.   Finally, stay active  Signed, Joel Salines, DO  09/23/2019 9:14 PM    [facility]

## 2019-09-23 NOTE — Patient Instructions (Signed)
Medication Instructions:  No medication changes. *If you need a refill on your cardiac medications before your next appointment, please call your pharmacy*   Lab Work: Your physician recommends that you have labs today for your upcoming cath. You had a BMET, Magnesium and  CBC.  If you have labs (blood work) drawn today and your tests are completely normal, you will receive your results only by: Marland Kitchen MyChart Message (if you have MyChart) OR . A paper copy in the mail If you have any lab test that is abnormal or we need to change your treatment, we will call you to review the results.   Testing/Procedures:    [place] [address] Dept: [phone] Loc: [phone]  [name]  09/23/2019  You are scheduled for a Cardiac Catheterization on Friday, June 18 with Dr. Larae Grooms.  1. Please arrive at the [facility] (Main Entrance A) at [facility]: [address] [place], [place] [address] at 5:30 AM (This time is two hours before your procedure to ensure your preparation). Free valet parking service is available.   Special note: Every effort is made to have your procedure done on time. Please understand that emergencies sometimes delay scheduled procedures.  2. Diet: Do not eat solid foods after midnight.  The patient may have clear liquids until 5am upon the day of the procedure.  3. Labs: You had labs done today.  4. Medication instructions in preparation for your procedure:   Contrast Allergy: No  Stop taking, HTCZ (Hydrochlorothiazide) Friday, June 18, Stop taking, Lasix (Furosemide)  Friday, June 18, Stop taking, Cozaar (Losartan) Friday, June 18, Stop taking, Advil or Motrin (Ibuprofen) Friday, June 18, Do not take Diabetes Med Glucophage (Metformin) on the day of the procedure and HOLD 48 HOURS AFTER THE PROCEDURE.  On the morning of your  procedure, take your Plavix/Clopidogrel and any morning medicines NOT listed above.  You may use sips of water.  5. Plan for one night stay--bring personal belongings. 6. Bring a current list of your medications and current insurance cards. 7. You MUST have a responsible person to drive you home. 8. Someone MUST be with you the first 24 hours after you arrive home or your discharge will be delayed. 9. Please wear clothes that are easy to get on and off and wear slip-on shoes.  Thank you for allowing [place] to care for you!   -- [facility]    Follow-Up: At [facility], you and your health needs are our priority.  As part of our continuing mission to provide you with exceptional heart care, we have created designated Provider Care Teams.  These Care Teams include your primary Cardiologist (physician) and Advanced Practice Providers (APPs -  Physician Assistants and Nurse Practitioners) who all work together to provide you with the care you need, when you need it.  We recommend signing up for the patient portal called "MyChart".  Sign up information is provided on this After Visit Summary.  MyChart is used to connect with patients for Virtual Visits (Telemedicine).  Patients are able to view lab/test results, encounter notes, upcoming appointments, etc.  Non-urgent messages can be sent to your provider as well.   To learn more about what you can do with MyChart, go to [email or web address].    Your next appointment:   1 month(s)  The format for your next appointment:   In  Person  Provider:   Berniece Salines, DO   Other Instructions  Coronary Angioplasty  Coronary angioplasty is a procedure to widen a narrowed or blocked blood vessel of the heart (coronary artery). The artery is usually blocked by cholesterol buildup (plaques) in the lining of the artery walls. When a vessel in the heart becomes partially blocked, there is decreased blood flow to that area.  This may lead to chest pain or a heart attack (myocardial infarction). Tell a health care provider about:  Any allergies you have, including allergies to shellfish or contrast dye.  All medicines you are taking, including vitamins, herbs, eye drops, creams, and over-the-counter medicines.  Any problems you or family members have had with anesthetic medicines.  Any blood disorders you have.  Any surgeries you have had.  Any medical conditions you have.  Whether you are pregnant or may be pregnant. What are the risks? Generally, this is a safe procedure. However, problems may occur, including:  Damage to other structures or organs. This may include damage to blood vessels, leading to rupture or bleeding.  Infection, bleeding, or bruising at the site where a small, thin tube (catheter) will be inserted.  Allergic reaction to the dye or contrast that is used.  Kidney damage from the dye or contrast that is used.  Blood clots that can lead to a stroke or heart attack.  Bleeding into the abdomen (retroperitoneal bleeding). What happens before the procedure? Staying hydrated Follow instructions from your health care provider about hydration, which may include:  Up to 2 hours before the procedure - you may continue to drink clear liquids, such as water, clear fruit juice, black coffee, and plain tea. Eating and drinking restrictions Follow instructions from your health care provider about eating and drinking, which may include:  8 hours before the procedure - stop eating heavy meals or foods such as meat, fried foods, or fatty foods.  6 hours before the procedure - stop eating light meals or foods, such as toast or cereal.  2 hours before the procedure - stop drinking clear liquids. Medicines  Ask your health care provider about: ? Changing or stopping your regular medicines. This is especially important if you are taking diabetes medicines or blood thinners. ? Whether aspirin  is recommended before this procedure.  Ask your health care provider if you can take a sip of water with any approved medicines the morning of the procedure. General instructions  Plan to have someone take you home from the hospital or clinic.  If you will be going home right after the procedure, plan to have someone with you for 24 hours. What happens during the procedure?  To reduce your risk of infection: ? Your health care team will wash or sanitize their hands. ? A germ-killing solution (antiseptic) will be used to wash the area where the catheter will be inserted. Hair may be removed from this area. The catheter may be inserted in:  Your groin area. This is the most common area.  The fold of your arm, near your elbow.  Your wrist.  An IV tube will be inserted into one of your veins.  You will be given a medicine to help you relax (sedative).  You will be given a medicine to numb the area where the catheter will be inserted (local anesthetic).  The catheter will be inserted into an artery.  The catheter will be guided to the narrowed or blocked artery using a type of X-ray (fluoroscopy).  When the catheter is near the heart, dye will be injected that makes the narrowing or blockage visible on the X-ray.  Once the catheter is positioned at the narrowed or blocked portion of the blood vessel, a balloon will be inflated to make the artery wider. Expanding the balloon will crush the plaques into the wall of the vessel and improve the blood flow.  The artery may be made wider by removing plaques using a drill, laser, or other tools.  When the blood flow is better, the balloon will be deflated and the catheter will be removed.  A stent may be placed. This is common in this procedure.  After the catheter is removed, a special dressing will be placed over the insertion site. What happens after the procedure?  You will need to keep the area still for a few hours, or as long as  directed by your health care provider. If the procedure was done in the groin, you will be instructed not to bend or cross your legs.  The insertion site will be checked often.  The pulse in your feet or wrist will be checked often.  Additional blood tests, X-rays, and an electrocardiogram (ECG) may be done.  Do not drive for 24 hours if you were given a sedative. This information is not intended to replace advice given to you by your health care provider. Make sure you discuss any questions you have with your health care provider. Document Revised: 03/13/2017 Document Reviewed: 12/28/2015 Elsevier Patient Education  2020 [employer].

## 2019-09-24 LAB — CBC WITH DIFFERENTIAL/PLATELET
Basophils Absolute: 0.1 10*3/uL (ref 0.0–0.2)
Basos: 1 %
EOS (ABSOLUTE): 0.3 10*3/uL (ref 0.0–0.4)
Eos: 4 %
Hematocrit: 39.9 % (ref 37.5–51.0)
Hemoglobin: 13.5 g/dL (ref 13.0–17.7)
Immature Grans (Abs): 0.1 10*3/uL (ref 0.0–0.1)
Immature Granulocytes: 1 %
Lymphocytes Absolute: 1.6 10*3/uL (ref 0.7–3.1)
Lymphs: 20 %
MCH: 30 pg (ref 26.6–33.0)
MCHC: 33.8 g/dL (ref 31.5–35.7)
MCV: 89 fL (ref 79–97)
Monocytes Absolute: 0.7 10*3/uL (ref 0.1–0.9)
Monocytes: 8 %
Neutrophils Absolute: 5.2 10*3/uL (ref 1.4–7.0)
Neutrophils: 66 %
Platelets: 252 10*3/uL (ref 150–450)
RBC: 4.5 x10E6/uL (ref 4.14–5.80)
RDW: 12.9 % (ref 11.6–15.4)
WBC: 7.8 10*3/uL (ref 3.4–10.8)

## 2019-09-24 LAB — BASIC METABOLIC PANEL
BUN/Creatinine Ratio: 24 (ref 10–24)
BUN: 15 mg/dL (ref 8–27)
CO2: 24 mmol/L (ref 20–29)
Calcium: 9.5 mg/dL (ref 8.6–10.2)
Chloride: 97 mmol/L (ref 96–106)
Creatinine, Ser: 0.63 mg/dL — ABNORMAL LOW (ref 0.76–1.27)
GFR calc Af Amer: 115 mL/min/{1.73_m2} (ref >59)
GFR calc non Af Amer: 99 mL/min/{1.73_m2} (ref >59)
Glucose: 111 mg/dL — ABNORMAL HIGH (ref 65–99)
Potassium: 4.4 mmol/L (ref 3.5–5.2)
Sodium: 137 mmol/L (ref 134–144)

## 2019-09-24 LAB — MAGNESIUM: Magnesium: 1.7 mg/dL (ref 1.6–2.3)

## 2019-09-26 ENCOUNTER — Telehealth: Payer: Self-pay

## 2019-09-26 NOTE — Telephone Encounter (Signed)
-----   Message from Berniece Salines, DO sent at 09/26/2019 10:35 AM EDT ----- Stable labs.

## 2019-09-26 NOTE — Telephone Encounter (Signed)
Spoke with patient regarding results and recommendation.  Patient verbalizes understanding and is agreeable to plan of care. Advised patient to call back with any issues or concerns.  

## 2019-09-28 DIAGNOSIS — Z23 Encounter for immunization: Secondary | ICD-10-CM | POA: Diagnosis not present

## 2019-09-29 ENCOUNTER — Telehealth: Payer: Self-pay | Admitting: *Deleted

## 2019-09-29 NOTE — Telephone Encounter (Signed)
Pt contacted pre-catheterization scheduled at [facility] for: Friday September 30, 2019 7:30 AM Verified arrival time and place: [facility]) at: 5:30 AM   No solid food after midnight prior to cath, clear liquids until 5 AM day of procedure.  Hold: Lasix-AM of procedure HCTZ-AM of procedure Metformin-day of procedure and 48 hours post procedure  Except hold medications AM meds can be  taken pre-cath with sips of water including: ASA 81 mg Plavix 75 mg    Confirmed patient has responsible adult to drive home post procedure and observe 24 hours after arriving home: yes  You are allowed ONE visitor in the waiting room during your procedure. Both you and your visitor must wear masks.      COVID-19 Pre-Screening Questions:   In the past 7 to 10 days have you had a new cough, shortness of breath, headache, congestion, fever (100 or greater) unexplained body aches, new sore throat, or sudden loss of taste or sense of smell? no  In the past 7 to 10 days have you been around anyone with known Covid 19? no   Reviewed procedure/mask/visitor instructions, COVID-19 screening questions with patient.

## 2019-09-30 ENCOUNTER — Other Ambulatory Visit: Payer: Self-pay

## 2019-09-30 ENCOUNTER — Encounter (HOSPITAL_COMMUNITY)
Admission: RE | Disposition: A | Discharge status: Home / Self Care | Payer: Self-pay | Source: Home / Self Care | Attending: Interventional Cardiology

## 2019-09-30 ENCOUNTER — Encounter (HOSPITAL_COMMUNITY): Payer: Self-pay | Admitting: Interventional Cardiology

## 2019-09-30 ENCOUNTER — Ambulatory Visit (HOSPITAL_COMMUNITY)
Admission: RE | Admit: 2019-09-30 | Discharge: 2019-09-30 | Disposition: A | Discharge status: Home / Self Care | Payer: Medicare Other | Attending: Interventional Cardiology | Admitting: Interventional Cardiology

## 2019-09-30 DIAGNOSIS — E039 Hypothyroidism, unspecified: Secondary | ICD-10-CM | POA: Diagnosis not present

## 2019-09-30 DIAGNOSIS — E119 Type 2 diabetes mellitus without complications: Secondary | ICD-10-CM | POA: Diagnosis not present

## 2019-09-30 DIAGNOSIS — R9439 Abnormal result of other cardiovascular function study: Secondary | ICD-10-CM | POA: Diagnosis present

## 2019-09-30 DIAGNOSIS — K589 Irritable bowel syndrome without diarrhea: Secondary | ICD-10-CM | POA: Diagnosis not present

## 2019-09-30 DIAGNOSIS — I2510 Atherosclerotic heart disease of native coronary artery without angina pectoris: Secondary | ICD-10-CM | POA: Insufficient documentation

## 2019-09-30 DIAGNOSIS — K219 Gastro-esophageal reflux disease without esophagitis: Secondary | ICD-10-CM | POA: Diagnosis not present

## 2019-09-30 DIAGNOSIS — Z7984 Long term (current) use of oral hypoglycemic drugs: Secondary | ICD-10-CM | POA: Diagnosis not present

## 2019-09-30 DIAGNOSIS — I2582 Chronic total occlusion of coronary artery: Secondary | ICD-10-CM | POA: Diagnosis not present

## 2019-09-30 DIAGNOSIS — F419 Anxiety disorder, unspecified: Secondary | ICD-10-CM | POA: Diagnosis not present

## 2019-09-30 DIAGNOSIS — Z7902 Long term (current) use of antithrombotics/antiplatelets: Secondary | ICD-10-CM | POA: Diagnosis not present

## 2019-09-30 DIAGNOSIS — E782 Mixed hyperlipidemia: Secondary | ICD-10-CM | POA: Insufficient documentation

## 2019-09-30 DIAGNOSIS — F17290 Nicotine dependence, other tobacco product, uncomplicated: Secondary | ICD-10-CM | POA: Insufficient documentation

## 2019-09-30 DIAGNOSIS — I10 Essential (primary) hypertension: Secondary | ICD-10-CM | POA: Diagnosis not present

## 2019-09-30 DIAGNOSIS — Z79899 Other long term (current) drug therapy: Secondary | ICD-10-CM | POA: Diagnosis not present

## 2019-09-30 DIAGNOSIS — Z7982 Long term (current) use of aspirin: Secondary | ICD-10-CM | POA: Diagnosis not present

## 2019-09-30 DIAGNOSIS — Z6833 Body mass index (BMI) 33.0-33.9, adult: Secondary | ICD-10-CM | POA: Diagnosis not present

## 2019-09-30 DIAGNOSIS — E669 Obesity, unspecified: Secondary | ICD-10-CM | POA: Diagnosis not present

## 2019-09-30 DIAGNOSIS — F329 Major depressive disorder, single episode, unspecified: Secondary | ICD-10-CM | POA: Diagnosis not present

## 2019-09-30 DIAGNOSIS — Z955 Presence of coronary angioplasty implant and graft: Secondary | ICD-10-CM | POA: Diagnosis not present

## 2019-09-30 DIAGNOSIS — Z79890 Hormone replacement therapy (postmenopausal): Secondary | ICD-10-CM | POA: Insufficient documentation

## 2019-09-30 DIAGNOSIS — M109 Gout, unspecified: Secondary | ICD-10-CM | POA: Diagnosis not present

## 2019-09-30 HISTORY — PX: LEFT HEART CATH AND CORONARY ANGIOGRAPHY: CATH118249

## 2019-09-30 LAB — GLUCOSE, CAPILLARY: Glucose-Capillary: 117 mg/dL — ABNORMAL HIGH (ref 70–99)

## 2019-09-30 SURGERY — LEFT HEART CATH AND CORONARY ANGIOGRAPHY
Anesthesia: LOCAL

## 2019-09-30 MED ORDER — SODIUM CHLORIDE 0.9 % WEIGHT BASED INFUSION: 1.0000 mL/kg/h | INTRAVENOUS | Status: DC

## 2019-09-30 MED ORDER — LIDOCAINE HCL (PF) 1 % IJ SOLN
INTRAMUSCULAR | Status: AC
  Filled 2019-09-30: qty 30

## 2019-09-30 MED ORDER — HEPARIN (PORCINE) IN NACL 1000-0.9 UT/500ML-% IV SOLN
INTRAVENOUS | Status: DC | PRN
  Administered 2019-09-30 (×2): 500 mL

## 2019-09-30 MED ORDER — FENTANYL CITRATE (PF) 100 MCG/2ML IJ SOLN
INTRAMUSCULAR | Status: AC
  Filled 2019-09-30: qty 2

## 2019-09-30 MED ORDER — LABETALOL HCL 5 MG/ML IV SOLN: 10.0000 mg | INTRAVENOUS | Status: DC | PRN

## 2019-09-30 MED ORDER — MIDAZOLAM HCL 2 MG/2ML IJ SOLN
INTRAMUSCULAR | Status: AC
  Filled 2019-09-30: qty 2

## 2019-09-30 MED ORDER — SODIUM CHLORIDE 0.9% FLUSH: 3.0000 mL | Freq: Two times a day (BID) | INTRAVENOUS | Status: DC

## 2019-09-30 MED ORDER — SODIUM CHLORIDE 0.9% FLUSH: 3.0000 mL | INTRAVENOUS | Status: DC | PRN

## 2019-09-30 MED ORDER — SODIUM CHLORIDE 0.9 % IV SOLN: 250.0000 mL | INTRAVENOUS | Status: DC | PRN

## 2019-09-30 MED ORDER — SODIUM CHLORIDE 0.9 % IV SOLN: INTRAVENOUS | Status: AC

## 2019-09-30 MED ORDER — HEPARIN (PORCINE) IN NACL 1000-0.9 UT/500ML-% IV SOLN
INTRAVENOUS | Status: AC
  Filled 2019-09-30: qty 1000

## 2019-09-30 MED ORDER — VERAPAMIL HCL 2.5 MG/ML IV SOLN
INTRAVENOUS | Status: DC | PRN
  Administered 2019-09-30: 10 mL via INTRA_ARTERIAL

## 2019-09-30 MED ORDER — HEPARIN SODIUM (PORCINE) 1000 UNIT/ML IJ SOLN
INTRAMUSCULAR | Status: AC
  Filled 2019-09-30: qty 1

## 2019-09-30 MED ORDER — MIDAZOLAM HCL 2 MG/2ML IJ SOLN
INTRAMUSCULAR | Status: DC | PRN
  Administered 2019-09-30: 2 mg via INTRAVENOUS
  Administered 2019-09-30: 1 mg via INTRAVENOUS

## 2019-09-30 MED ORDER — IOHEXOL 350 MG/ML SOLN
INTRAVENOUS | Status: DC | PRN
  Administered 2019-09-30: 40 mL

## 2019-09-30 MED ORDER — LIDOCAINE HCL (PF) 1 % IJ SOLN
INTRAMUSCULAR | Status: DC | PRN
  Administered 2019-09-30: 2 mL

## 2019-09-30 MED ORDER — SODIUM CHLORIDE 0.9 % WEIGHT BASED INFUSION
3.0000 mL/kg/h | INTRAVENOUS | Status: AC
  Administered 2019-09-30: 3 mL/kg/h via INTRAVENOUS

## 2019-09-30 MED ORDER — ONDANSETRON HCL 4 MG/2ML IJ SOLN: 4.0000 mg | Freq: Four times a day (QID) | INTRAMUSCULAR | Status: DC | PRN

## 2019-09-30 MED ORDER — VERAPAMIL HCL 2.5 MG/ML IV SOLN
INTRAVENOUS | Status: AC
  Filled 2019-09-30: qty 2

## 2019-09-30 MED ORDER — HYDRALAZINE HCL 20 MG/ML IJ SOLN: 10.0000 mg | INTRAMUSCULAR | Status: DC | PRN

## 2019-09-30 MED ORDER — ACETAMINOPHEN 325 MG PO TABS: 650.0000 mg | ORAL_TABLET | ORAL | Status: DC | PRN

## 2019-09-30 MED ORDER — FENTANYL CITRATE (PF) 100 MCG/2ML IJ SOLN
INTRAMUSCULAR | Status: DC | PRN
  Administered 2019-09-30 (×2): 25 ug via INTRAVENOUS

## 2019-09-30 MED ORDER — METFORMIN HCL ER 500 MG PO TB24: 500.0000 mg | ORAL_TABLET | Freq: Two times a day (BID) | ORAL | Status: DC

## 2019-09-30 MED ORDER — ASPIRIN 81 MG PO CHEW: 81.0000 mg | CHEWABLE_TABLET | ORAL | Status: DC

## 2019-09-30 MED ORDER — HEPARIN SODIUM (PORCINE) 1000 UNIT/ML IJ SOLN
INTRAMUSCULAR | Status: DC | PRN
  Administered 2019-09-30: 5000 [IU] via INTRAVENOUS

## 2019-09-30 SURGICAL SUPPLY — 10 items
CATH 5FR JL3.5 JR4 ANG PIG MP (CATHETERS) ×1 IMPLANT
DEVICE RAD COMP TR BAND LRG (VASCULAR PRODUCTS) ×1 IMPLANT
GLIDESHEATH SLEND SS 6F .021 (SHEATH) ×1 IMPLANT
GUIDEWIRE INQWIRE 1.5J.035X260 (WIRE) IMPLANT
INQWIRE 1.5J .035X260CM (WIRE) ×2
KIT HEART LEFT (KITS) ×2 IMPLANT
PACK CARDIAC CATHETERIZATION (CUSTOM PROCEDURE TRAY) ×2 IMPLANT
TRANSDUCER W/STOPCOCK (MISCELLANEOUS) ×2 IMPLANT
TUBING CIL FLEX 10 FLL-RA (TUBING) ×2 IMPLANT
WIRE HI TORQ VERSACORE-J 145CM (WIRE) ×1 IMPLANT

## 2019-09-30 NOTE — Discharge Instructions (Signed)
Radial Site Care  This sheet gives you information about how to care for yourself after your procedure. Your health care provider may also give you more specific instructions. If you have problems or questions, contact your health care provider. What can I expect after the procedure? After the procedure, it is common to have:  Bruising and tenderness at the catheter insertion area. Follow these instructions at home: Medicines  Take over-the-counter and prescription medicines only as told by your health care provider. Insertion site care  Follow instructions from your health care provider about how to take care of your insertion site. Make sure you: ? Wash your hands with soap and water before you change your bandage (dressing). If soap and water are not available, use hand sanitizer. ? Change your dressing as told by your health care provider. ? Leave stitches (sutures), skin glue, or adhesive strips in place. These skin closures may need to stay in place for 2 weeks or longer. If adhesive strip edges start to loosen and curl up, you may trim the loose edges. Do not remove adhesive strips completely unless your health care provider tells you to do that.  Check your insertion site every day for signs of infection. Check for: ? Redness, swelling, or pain. ? Fluid or blood. ? Pus or a bad smell. ? Warmth.  Do not take baths, swim, or use a hot tub until your health care provider approves.  You may shower 24-48 hours after the procedure, or as directed by your health care provider. ? Remove the dressing and gently wash the site with plain soap and water. ? Pat the area dry with a clean towel. ? Do not rub the site. That could cause bleeding.  Do not apply powder or lotion to the site. Activity   For 24 hours after the procedure, or as directed by your health care provider: ? Do not flex or bend the affected arm. ? Do not push or pull heavy objects with the affected arm. ? Do not  drive yourself home from the hospital or clinic. You may drive 24 hours after the procedure unless your health care provider tells you not to. ? Do not operate machinery or power tools.  Do not lift anything that is heavier than 10 lb (4.5 kg), or the limit that you are told, until your health care provider says that it is safe.  Ask your health care provider when it is okay to: ? Return to work or school. ? Resume usual physical activities or sports. ? Resume sexual activity. General instructions  If the catheter site starts to bleed, raise your arm and put firm pressure on the site. If the bleeding does not stop, get help right away. This is a medical emergency.  If you went home on the same day as your procedure, a responsible adult should be with you for the first 24 hours after you arrive home.  Keep all follow-up visits as told by your health care provider. This is important. Contact a health care provider if:  You have a fever.  You have redness, swelling, or yellow drainage around your insertion site. Get help right away if:  You have unusual pain at the radial site.  The catheter insertion area swells very fast.  The insertion area is bleeding, and the bleeding does not stop when you hold steady pressure on the area.  Your arm or hand becomes pale, cool, tingly, or numb. These symptoms may represent a serious problem   that is an emergency. Do not wait to see if the symptoms will go away. Get medical help right away. Call your local emergency services (911 in the [place] Do not drive yourself to the hospital. Summary  After the procedure, it is common to have bruising and tenderness at the site.  Follow instructions from your health care provider about how to take care of your radial site wound. Check the wound every day for signs of infection.  Do not lift anything that is heavier than 10 lb (4.5 kg), or the limit that you are told, until your health care provider says  that it is safe. This information is not intended to replace advice given to you by your health care provider. Make sure you discuss any questions you have with your health care provider. Document Revised: 05/06/2017 Document Reviewed: 05/06/2017 Elsevier Patient Education  2020 [employer]  

## 2019-09-30 NOTE — Interval H&P Note (Signed)
Cath Lab Visit (complete for each Cath Lab visit)  Clinical Evaluation Leading to the Procedure:   ACS: No.  Non-ACS:    Anginal Classification: CCS III  Anti-ischemic medical therapy: Minimal Therapy (1 class of medications)  Non-Invasive Test Results: Intermediate-risk stress test findings: cardiac mortality 1-3%/year  Prior CABG: No previous CABG      History and Physical Interval Note:  09/30/2019 7:54 AM  [name]  has presented today for surgery, with the diagnosis of abnormal lexi scan.  The various methods of treatment have been discussed with the patient and family. After consideration of risks, benefits and other options for treatment, the patient has consented to  Procedure(s): LEFT HEART CATH AND CORONARY ANGIOGRAPHY (N/A) as a surgical intervention.  The patient's history has been reviewed, patient examined, no change in status, stable for surgery.  I have reviewed the patient's chart and labs.  Questions were answered to the patient's satisfaction.     Larae Grooms

## 2019-09-30 NOTE — Progress Notes (Signed)
Discharge instructions reviewed with pt and his grand daughter (via telephone) both voice understanding.

## 2019-10-25 ENCOUNTER — Other Ambulatory Visit: Payer: Self-pay

## 2019-10-25 ENCOUNTER — Ambulatory Visit (INDEPENDENT_AMBULATORY_CARE_PROVIDER_SITE_OTHER): Payer: Medicare Other | Admitting: Cardiology

## 2019-10-25 ENCOUNTER — Encounter: Payer: Self-pay | Admitting: Cardiology

## 2019-10-25 VITALS — BP 128/74 | HR 60 | Ht 69.0 in | Wt 229.0 lb

## 2019-10-25 DIAGNOSIS — E118 Type 2 diabetes mellitus with unspecified complications: Secondary | ICD-10-CM

## 2019-10-25 DIAGNOSIS — E669 Obesity, unspecified: Secondary | ICD-10-CM

## 2019-10-25 DIAGNOSIS — I2510 Atherosclerotic heart disease of native coronary artery without angina pectoris: Secondary | ICD-10-CM | POA: Diagnosis not present

## 2019-10-25 DIAGNOSIS — E782 Mixed hyperlipidemia: Secondary | ICD-10-CM

## 2019-10-25 DIAGNOSIS — I10 Essential (primary) hypertension: Secondary | ICD-10-CM | POA: Diagnosis not present

## 2019-10-25 NOTE — Progress Notes (Signed)
Cardiology Office Note:    Date:  10/25/2019   ID:  [name], DOB Nov 04, 1945, MRN [id number]  PCP:  Cyndi Bender, PA-C  Cardiologist:  Berniece Salines, DO  Electrophysiologist:  None   Referring MD: Cyndi Bender, PA-C   " I am doing wonderful"   History of Present Illness:    [name] is a 74 y.o. male with a hx of CAD s/p LAD stent 08/2011, hypertension, hyperlipidemia, diabetes mellitus, and obesity.  At his last visit on September 23, 2019 I recommended the patient undergo a left heart catheterization due to a positive stress test.  He was able to get his left heart catheterization however there is no need for intervention at this time.  He tells me since his catheterization he has made significant lifestyle changes with his diet as well as he is increase his exercise.  He is happy with his new change.  Past Medical History:  Diagnosis Date  . Anxiety   . Arthritis   . 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[place]) 03/14/2013  . Atypical chest pain 12/28/2013  . Chest pain 07/30/2011   1999 Cath - <50% stenosis 2008 NUC - low risk   . Chronic back pain    herniated disc  . Coronary artery disease    takes Plavix and ASA daily  . Depression    takes Prozac daily  . Diabetes mellitus type 2, controlled, with complications ([place]) 5/78/4696  . Diverticulitis    hx of  . Dizziness    occasionally and states Dr.Smith is aware  . DJD (degenerative joint disease)   . Essential hypertension, benign 07/30/2011  . GERD (gastroesophageal reflux disease)    takes an OTC antacid  . Gout    yrs ago and doesn't take any meds  . Hard of hearing    wears hearing aids  . Headache(784.0)    occasionally  . History of colon polyps    benign  . History of kidney stones   . HNP (herniated nucleus pulposus), lumbar 06/14/2014  . Hyperlipidemia    takes Atorvastatin daily  . Hypertension    takes Amlodipine,Losartan,and Coreg daily  .  Hypothyroidism    takes Synthroid daily  . IBS (irritable bowel syndrome)   . Obesity, unspecified 07/30/2011  . Shortness of breath dyspnea    with exertion  . Tuberculosis    Patient denies Tb  . Type II diabetes mellitus (HCC)    takes Metformin daily  . Unstable angina ([place]) 12/27/2013  . Weakness    numbness and tingling in left leg    Past Surgical History:  Procedure Laterality Date  . ANTERIOR CERVICAL DECOMP/DISCECTOMY FUSION  04/2007   C4-5; C6-7  . BACK SURGERY     "3 times; last time was screws in lower back 10/2008"  . CHOLECYSTECTOMY  1970's  . COLONOSCOPY    . CORONARY ANGIOPLASTY  1999/2013   1 stent  . CYSTOSCOPY    . LEFT HEART CATH AND CORONARY ANGIOGRAPHY N/A 09/30/2019   Procedure: LEFT HEART CATH AND CORONARY ANGIOGRAPHY;  Surgeon: Jettie Booze, MD;  Location: [facility];  Service: Cardiovascular;  Laterality: N/A;  . LEFT HEART CATHETERIZATION WITH CORONARY ANGIOGRAM N/A 08/21/2011   Procedure: LEFT HEART CATHETERIZATION WITH CORONARY ANGIOGRAM;  Surgeon: Sinclair Grooms, MD;  Location: [facility];  Service: Cardiovascular;  Laterality: N/A;  . LITHOTRIPSY    . LUMBAR LAMINECTOMY/DECOMPRESSION MICRODISCECTOMY Left 06/14/2014  Procedure: LEFT LUMBAR THREE-FOUR LANINOTOMY AND MICRODISKECTOMY.;  Surgeon: Hosie Spangle, MD;  Location: [facility];  Service: Neurosurgery;  Laterality: Left;  left  . PCI  5/09/2-13   LAD  . RADIOLOGY WITH ANESTHESIA N/A 11/13/2015   Procedure: MRI OF LUMBAR SPINE W/WO CONTRAST    (RADIOLOGY WITH ANESTHESIA);  Surgeon: Medication Radiologist, MD;  Location: [facility];  Service: Radiology;  Laterality: N/A;  . SHOULDER ARTHROSCOPY W/ ROTATOR CUFF REPAIR  ~ 2011   left    Current Medications: Current Meds  Medication Sig  . acetaminophen (TYLENOL) 500 MG tablet Take 1,000 mg by mouth every 6 (six) hours as needed for mild pain.   Marland Kitchen amLODipine (NORVASC) 10 MG tablet Take 10 mg by mouth daily.  Marland Kitchen ammonium lactate  (LAC-HYDRIN) 12 % lotion   . aspirin 81 MG tablet Take 81 mg by mouth daily.  Marland Kitchen atorvastatin (LIPITOR) 20 MG tablet Take 20 mg by mouth daily.  . carvedilol (COREG) 25 MG tablet Take 25 mg by mouth 2 (two) times daily with a meal.  . clopidogrel (PLAVIX) 75 MG tablet Take 75 mg by mouth daily with breakfast.  . furosemide (LASIX) 20 MG tablet Take 20 mg by mouth daily as needed.  . gabapentin (NEURONTIN) 300 MG capsule   . hydrochlorothiazide (HYDRODIURIL) 25 MG tablet Take 25 mg by mouth daily.   Marland Kitchen HYDROmorphone (DILAUDID) 2 MG tablet Take 2 mg by mouth every 4 (four) hours as needed for moderate pain or severe pain.   Marland Kitchen ibuprofen (ADVIL) 200 MG tablet Take 200 mg by mouth as needed.  Marland Kitchen levothyroxine (SYNTHROID, LEVOTHROID) 50 MCG tablet Take 50 mcg by mouth daily before breakfast.  . losartan (COZAAR) 100 MG tablet Take 100 mg by mouth daily.  . metFORMIN (GLUCOPHAGE-XR) 500 MG 24 hr tablet Take 1 tablet (500 mg total) by mouth 2 (two) times daily.  . nitroGLYCERIN (NITROSTAT) 0.4 MG SL tablet Place 0.4 mg under the tongue every 5 (five) minutes as needed. For chest pain  . sertraline (ZOLOFT) 100 MG tablet Take 100 mg by mouth daily.  . traMADol (ULTRAM) 50 MG tablet Take 50 mg by mouth every 4 (four) hours as needed.     Allergies:   Sulfa antibiotics   Social History   Socioeconomic History  . Marital status: Single    Spouse name: Not on file  . Number of children: Not on file  . Years of education: Not on file  . Highest education level: Not on file  Occupational History  . Not on file  Tobacco Use  . Smoking status: Former Research scientist (life sciences)  . Smokeless tobacco: Current User    Types: Chew  Substance and Sexual Activity  . Alcohol use: No  . Drug use: No  . Sexual activity: Never  Other Topics Concern  . Not on file  Social History Narrative  . Not on file   Social Determinants of Health   Financial Resource Strain:   . Difficulty of Paying Living Expenses:   Food  Insecurity:   . Worried About Charity fundraiser in the Last Year:   . Arboriculturist in the Last Year:   Transportation Needs:   . Film/video editor (Medical):   Marland Kitchen Lack of Transportation (Non-Medical):   Physical Activity:   . Days of Exercise per Week:   . Minutes of Exercise per Session:   Stress:   . Feeling of Stress :   Social Connections:   .  Frequency of Communication with Friends and Family:   . Frequency of Social Gatherings with Friends and Family:   . Attends Religious Services:   . Active Member of Clubs or Organizations:   . Attends Archivist Meetings:   Marland Kitchen Marital Status:      Family History: The patient's family history includes Arthritis in his mother; Cancer in his father; Healthy in his sister and sister; Heart attack in his brother; Heart disease in his brother; Hypertension in his brother.  ROS:   Review of Systems  Constitution: Negative for decreased appetite, fever and weight gain.  HENT: Negative for congestion, ear discharge, hoarse voice and sore throat.   Eyes: Negative for discharge, redness, vision loss in right eye and visual halos.  Cardiovascular: Negative for chest pain, dyspnea on exertion, leg swelling, orthopnea and palpitations.  Respiratory: Negative for cough, hemoptysis, shortness of breath and snoring.   Endocrine: Negative for heat intolerance and polyphagia.  Hematologic/Lymphatic: Negative for bleeding problem. Does not bruise/bleed easily.  Skin: Negative for flushing, nail changes, rash and suspicious lesions.  Musculoskeletal: Negative for arthritis, joint pain, muscle cramps, myalgias, neck pain and stiffness.  Gastrointestinal: Negative for abdominal pain, bowel incontinence, diarrhea and excessive appetite.  Genitourinary: Negative for decreased libido, genital sores and incomplete emptying.  Neurological: Negative for brief paralysis, focal weakness, headaches and loss of balance.  Psychiatric/Behavioral:  Negative for altered mental status, depression and suicidal ideas.  Allergic/Immunologic: Negative for HIV exposure and persistent infections.    EKGs/Labs/Other Studies Reviewed:    The following studies were reviewed today:   EKG: None today  LHC 09/30/2019  Prox RCA to Mid RCA lesion is 100% stenosed. There are brisk left to right collaterals.  1st Mrg lesion is 99% stenosed. This is unchanged from 2013.  3rd Mrg lesion is 75% stenosed. This is unchanged from 2013.  Previously placed Mid LAD stent (unknown type) is widely patent.  Prox LAD to Mid LAD lesion is 40% stenosed.  The left ventricular systolic function is normal.  LV end diastolic pressure is normal.  The left ventricular ejection fraction is 55-65% by visual estimate.  There is no aortic valve stenosis.  CTO of RCA with left to right collaterals.  Patient does not report angina.  Continued small vessel disease in the branches of the circumflex- unchanged from prior.  Continue medical therapy.   Recent Labs: 09/23/2019: BUN 15; Creatinine, Ser 0.63; Hemoglobin 13.5; Magnesium 1.7; Platelets 252; Potassium 4.4; Sodium 137  Recent Lipid Panel    Component Value Date/Time   CHOL 175 07/31/2011 0352   TRIG 161 (H) 07/31/2011 0352   HDL 38 (L) 07/31/2011 0352   CHOLHDL 4.6 07/31/2011 0352   VLDL 32 07/31/2011 0352   LDLCALC 105 (H) 07/31/2011 0352    Physical Exam:    VS:  BP 128/74   Pulse 60   Ht 5\' 9"  (1.753 m)   Wt 229 lb (103.9 kg)   SpO2 96%   BMI 33.82 kg/m     Wt Readings from Last 3 Encounters:  10/25/19 229 lb (103.9 kg)  09/30/19 225 lb (102.1 kg)  09/23/19 231 lb 6.4 oz (105 kg)     GEN: Well nourished, well developed in no acute distress HEENT: Normal NECK: No JVD; No carotid bruits LYMPHATICS: No lymphadenopathy CARDIAC: S1S2 noted,RRR, no murmurs, rubs, gallops RESPIRATORY:  Clear to auscultation without rales, wheezing or rhonchi  ABDOMEN: Soft, non-tender, non-distended,  +bowel sounds, no guarding. EXTREMITIES: No edema, No  cyanosis, no clubbing MUSCULOSKELETAL:  No deformity  SKIN: Warm and dry NEUROLOGIC:  Alert and oriented x 3, non-focal PSYCHIATRIC:  Normal affect, good insight  ASSESSMENT:    1. Essential hypertension, benign   2. CAD in native artery   3. Controlled type 2 diabetes mellitus with complication, without long-term current use of insulin ([place])   4. Mixed hyperlipidemia   5. Obesity (BMI 30-39.9)    PLAN:     He clinically has improved.  He has lost about 3 pounds since his last visit.  His blood pressure is below target.  I am really happy for the patient about his new change as he is moving in a positive direction.  Coronary artery disease-stable continue patient on his aspirin and Plavix for now.  If he continues to improve clinically I plan to stop his Plavix at his next visit.  Continue patient on atorvastatin 20 mg daily.  Hypertension his blood pressure has significantly improved continue his current medication regimen.  Diabetes mellitus continue patient on his current medication per PCP.  Obesity-patient is not active with exercise and intent to lose a lot more weight.  The patient is in agreement with the above plan. The patient left the office in stable condition.  The patient will follow up in 6 months or sooner if needed.   Medication Adjustments/Labs and Tests Ordered: Current medicines are reviewed at length with the patient today.  Concerns regarding medicines are outlined above.  No orders of the defined types were placed in this encounter.  No orders of the defined types were placed in this encounter.   Patient Instructions  Medication Instructions:  No medication changes. *If you need a refill on your cardiac medications before your next appointment, please call your pharmacy*   Lab Work: None ordered If you have labs (blood work) drawn today and your tests are completely normal, you will receive your  results only by: Marland Kitchen MyChart Message (if you have MyChart) OR . A paper copy in the mail If you have any lab test that is abnormal or we need to change your treatment, we will call you to review the results.   Testing/Procedures: None ordered   Follow-Up: At [facility], you and your health needs are our priority.  As part of our continuing mission to provide you with exceptional heart care, we have created designated Provider Care Teams.  These Care Teams include your primary Cardiologist (physician) and Advanced Practice Providers (APPs -  Physician Assistants and Nurse Practitioners) who all work together to provide you with the care you need, when you need it.  We recommend signing up for the patient portal called "MyChart".  Sign up information is provided on this After Visit Summary.  MyChart is used to connect with patients for Virtual Visits (Telemedicine).  Patients are able to view lab/test results, encounter notes, upcoming appointments, etc.  Non-urgent messages can be sent to your provider as well.   To learn more about what you can do with MyChart, go to [email or web address].    Your next appointment:   6 month(s)  The format for your next appointment:   In Person  Provider:   Berniece Salines, DO   Other Instructions NA     Adopting a Healthy Lifestyle.  Know what a healthy weight is for you (roughly BMI <25) and aim to maintain this   Aim for 7+ servings of fruits and vegetables daily   65-80+ fluid ounces of water or unsweet tea  for healthy kidneys   Limit to max 1 drink of alcohol per day; avoid smoking/tobacco   Limit animal fats in diet for cholesterol and heart health - choose grass fed whenever available   Avoid highly processed foods, and foods high in saturated/trans fats   Aim for low stress - take time to unwind and care for your mental health   Aim for 150 min of moderate intensity exercise weekly for heart health, and weights twice weekly for  bone health   Aim for 7-9 hours of sleep daily   When it comes to diets, agreement about the perfect plan isnt easy to find, even among the experts. Experts at the [employer] developed an idea known as the Healthy Eating Plate. Just imagine a plate divided into logical, healthy portions.   The emphasis is on diet quality:   Load up on vegetables and fruits - one-half of your plate: Aim for color and variety, and remember that potatoes dont count.   Go for whole grains - one-quarter of your plate: Whole wheat, barley, wheat berries, quinoa, oats, brown rice, and foods made with them. If you want pasta, go with whole wheat pasta.   Protein power - one-quarter of your plate: Fish, chicken, beans, and nuts are all healthy, versatile protein sources. Limit red meat.   The diet, however, does go beyond the plate, offering a few other suggestions.   Use healthy plant oils, such as olive, canola, soy, corn, sunflower and peanut. Check the labels, and avoid partially hydrogenated oil, which have unhealthy trans fats.   If youre thirsty, drink water. Coffee and tea are good in moderation, but skip sugary drinks and limit milk and dairy products to one or two daily servings.   The type of carbohydrate in the diet is more important than the amount. Some sources of carbohydrates, such as vegetables, fruits, whole grains, and beans-are healthier than others.   Finally, stay active  Signed, Berniece Salines, DO  10/25/2019 9:50 AM    [facility]

## 2019-10-25 NOTE — Patient Instructions (Signed)

## 2019-10-27 DIAGNOSIS — Z9181 History of falling: Secondary | ICD-10-CM | POA: Diagnosis not present

## 2019-10-27 DIAGNOSIS — Z139 Encounter for screening, unspecified: Secondary | ICD-10-CM | POA: Diagnosis not present

## 2019-10-27 DIAGNOSIS — Z6837 Body mass index (BMI) 37.0-37.9, adult: Secondary | ICD-10-CM | POA: Diagnosis not present

## 2019-10-27 DIAGNOSIS — Z1331 Encounter for screening for depression: Secondary | ICD-10-CM | POA: Diagnosis not present

## 2019-10-27 DIAGNOSIS — Z0000 Encounter for general adult medical examination without abnormal findings: Secondary | ICD-10-CM | POA: Diagnosis not present

## 2019-10-27 DIAGNOSIS — E785 Hyperlipidemia, unspecified: Secondary | ICD-10-CM | POA: Diagnosis not present

## 2020-01-20 DIAGNOSIS — R600 Localized edema: Secondary | ICD-10-CM | POA: Diagnosis not present

## 2020-01-20 DIAGNOSIS — R413 Other amnesia: Secondary | ICD-10-CM | POA: Diagnosis not present

## 2020-01-20 DIAGNOSIS — Z6831 Body mass index (BMI) 31.0-31.9, adult: Secondary | ICD-10-CM | POA: Diagnosis not present

## 2020-01-20 DIAGNOSIS — F418 Other specified anxiety disorders: Secondary | ICD-10-CM | POA: Diagnosis not present

## 2020-01-20 DIAGNOSIS — I2510 Atherosclerotic heart disease of native coronary artery without angina pectoris: Secondary | ICD-10-CM | POA: Diagnosis not present

## 2020-01-20 DIAGNOSIS — E039 Hypothyroidism, unspecified: Secondary | ICD-10-CM | POA: Diagnosis not present

## 2020-01-20 DIAGNOSIS — I10 Essential (primary) hypertension: Secondary | ICD-10-CM | POA: Diagnosis not present

## 2020-01-20 DIAGNOSIS — Z125 Encounter for screening for malignant neoplasm of prostate: Secondary | ICD-10-CM | POA: Diagnosis not present

## 2020-01-20 DIAGNOSIS — R1032 Left lower quadrant pain: Secondary | ICD-10-CM | POA: Diagnosis not present

## 2020-01-20 DIAGNOSIS — E7800 Pure hypercholesterolemia, unspecified: Secondary | ICD-10-CM | POA: Diagnosis not present

## 2020-01-20 DIAGNOSIS — E119 Type 2 diabetes mellitus without complications: Secondary | ICD-10-CM | POA: Diagnosis not present

## 2020-01-20 DIAGNOSIS — G4700 Insomnia, unspecified: Secondary | ICD-10-CM | POA: Diagnosis not present

## 2020-02-06 DIAGNOSIS — Z23 Encounter for immunization: Secondary | ICD-10-CM | POA: Diagnosis not present

## 2020-02-08 DIAGNOSIS — L570 Actinic keratosis: Secondary | ICD-10-CM | POA: Diagnosis not present

## 2020-02-08 DIAGNOSIS — L853 Xerosis cutis: Secondary | ICD-10-CM | POA: Diagnosis not present

## 2020-03-02 DIAGNOSIS — E039 Hypothyroidism, unspecified: Secondary | ICD-10-CM | POA: Insufficient documentation

## 2020-03-02 DIAGNOSIS — Z86010 Personal history of colonic polyps: Secondary | ICD-10-CM | POA: Insufficient documentation

## 2020-03-02 DIAGNOSIS — F32A Depression, unspecified: Secondary | ICD-10-CM | POA: Insufficient documentation

## 2020-03-02 DIAGNOSIS — K5792 Diverticulitis of intestine, part unspecified, without perforation or abscess without bleeding: Secondary | ICD-10-CM | POA: Insufficient documentation

## 2020-03-02 DIAGNOSIS — I10 Essential (primary) hypertension: Secondary | ICD-10-CM | POA: Insufficient documentation

## 2020-03-02 DIAGNOSIS — A159 Respiratory tuberculosis unspecified: Secondary | ICD-10-CM | POA: Insufficient documentation

## 2020-03-02 DIAGNOSIS — Z87442 Personal history of urinary calculi: Secondary | ICD-10-CM | POA: Insufficient documentation

## 2020-03-02 DIAGNOSIS — K219 Gastro-esophageal reflux disease without esophagitis: Secondary | ICD-10-CM | POA: Insufficient documentation

## 2020-03-02 DIAGNOSIS — M1990 Unspecified osteoarthritis, unspecified site: Secondary | ICD-10-CM | POA: Insufficient documentation

## 2020-03-02 DIAGNOSIS — K589 Irritable bowel syndrome without diarrhea: Secondary | ICD-10-CM | POA: Insufficient documentation

## 2020-03-02 DIAGNOSIS — G8929 Other chronic pain: Secondary | ICD-10-CM | POA: Insufficient documentation

## 2020-03-02 DIAGNOSIS — R531 Weakness: Secondary | ICD-10-CM | POA: Insufficient documentation

## 2020-03-02 DIAGNOSIS — M549 Dorsalgia, unspecified: Secondary | ICD-10-CM | POA: Insufficient documentation

## 2020-03-02 DIAGNOSIS — F419 Anxiety disorder, unspecified: Secondary | ICD-10-CM | POA: Insufficient documentation

## 2020-03-02 DIAGNOSIS — E119 Type 2 diabetes mellitus without complications: Secondary | ICD-10-CM | POA: Insufficient documentation

## 2020-03-02 DIAGNOSIS — R42 Dizziness and giddiness: Secondary | ICD-10-CM | POA: Insufficient documentation

## 2020-03-02 DIAGNOSIS — I2510 Atherosclerotic heart disease of native coronary artery without angina pectoris: Secondary | ICD-10-CM | POA: Insufficient documentation

## 2020-03-05 ENCOUNTER — Ambulatory Visit (INDEPENDENT_AMBULATORY_CARE_PROVIDER_SITE_OTHER): Payer: Medicare Other | Admitting: Cardiology

## 2020-03-05 ENCOUNTER — Encounter: Payer: Self-pay | Admitting: Cardiology

## 2020-03-05 ENCOUNTER — Other Ambulatory Visit: Payer: Self-pay

## 2020-03-05 VITALS — BP 132/66 | HR 62 | Ht 69.0 in | Wt 219.4 lb

## 2020-03-05 DIAGNOSIS — I2510 Atherosclerotic heart disease of native coronary artery without angina pectoris: Secondary | ICD-10-CM

## 2020-03-05 DIAGNOSIS — I10 Essential (primary) hypertension: Secondary | ICD-10-CM

## 2020-03-05 DIAGNOSIS — E782 Mixed hyperlipidemia: Secondary | ICD-10-CM

## 2020-03-05 DIAGNOSIS — E1100 Type 2 diabetes mellitus with hyperosmolarity without nonketotic hyperglycemic-hyperosmolar coma (NKHHC): Secondary | ICD-10-CM

## 2020-03-05 NOTE — Progress Notes (Signed)
Cardiology Office Note:    Date:  03/05/2020   ID:  [name], DOB [birth year], MRN [id number]  PCP:  Cyndi Bender, PA-C  Cardiologist:  Berniece Salines, DO  Electrophysiologist:  None   Referring MD: Cyndi Bender, PA-C   " I am doing fine"  History of Present Illness:    [name] is a 74 y.o. male with a hx of CAD s/p LAD stent 08/2011, hypertension, hyperlipidemia, diabetes mellitus, and obesity.  At his last visit on September 23, 2019 I recommended the patient undergo a left heart catheterization due to a positive stress test.  He was able to get his left heart catheterization however there is no need for intervention at this time.  Last saw the patient in July 2021 at that time he appears to be doing well from a cardiovascular standpoint.  No medication changes were made at this time.  He is here today for follow-up visit.  He has no complaints.  Past Medical History:  Diagnosis Date   Anxiety    Arthritis    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[place]) 03/14/2013   Atypical chest pain 12/28/2013   Chest pain 07/30/2011   1999 Cath - <50% stenosis 2008 NUC - low risk    Chronic back pain    herniated disc   Coronary artery disease    takes Plavix and ASA daily   Depression    takes Prozac daily   Diabetes mellitus type 2, controlled, with complications ([place]) 1/79/1505   Diverticulitis    hx of   Dizziness    occasionally and states Dr.Smith is aware   DJD (degenerative joint disease)    Essential hypertension, benign 07/30/2011   GERD (gastroesophageal reflux disease)    takes an OTC antacid   Gout    yrs ago and doesn't take any meds   Hard of hearing    wears hearing aids   Headache(784.0)    occasionally   History of colon polyps    benign   History of kidney stones    HNP (herniated nucleus pulposus), lumbar 06/14/2014   Hyperlipidemia    takes Atorvastatin daily   Hypertension    takes  Amlodipine,Losartan,and Coreg daily   Hypothyroidism    takes Synthroid daily   IBS (irritable bowel syndrome)    Obesity, unspecified 07/30/2011   Shortness of breath dyspnea    with exertion   Tuberculosis    Patient denies Tb   Type II diabetes mellitus ([place])    takes Metformin daily   Unstable angina ([place]) 12/27/2013   Weakness    numbness and tingling in left leg    Past Surgical History:  Procedure Laterality Date   ANTERIOR CERVICAL DECOMP/DISCECTOMY FUSION  04/2007   C4-5; C6-7   BACK SURGERY     "3 times; last time was screws in lower back 10/2008"   CHOLECYSTECTOMY  1970's   COLONOSCOPY     CORONARY ANGIOPLASTY  1999/2013   1 stent   CYSTOSCOPY     LEFT HEART CATH AND CORONARY ANGIOGRAPHY N/A 09/30/2019   Procedure: LEFT HEART CATH AND CORONARY ANGIOGRAPHY;  Surgeon: Jettie Booze, MD;  Location: [facility];  Service: Cardiovascular;  Laterality: N/A;   LEFT HEART CATHETERIZATION WITH CORONARY ANGIOGRAM N/A 08/21/2011   Procedure: LEFT HEART CATHETERIZATION WITH CORONARY ANGIOGRAM;  Surgeon: Sinclair Grooms, MD;  Location: [facility] CATH LAB;  Service: Cardiovascular;  Laterality: N/A;   LITHOTRIPSY  LUMBAR LAMINECTOMY/DECOMPRESSION MICRODISCECTOMY Left 06/14/2014   Procedure: LEFT LUMBAR THREE-FOUR LANINOTOMY AND MICRODISKECTOMY.;  Surgeon: Hosie Spangle, MD;  Location: [place] [facility];  Service: Neurosurgery;  Laterality: Left;  left   PCI  5/09/2-13   LAD   RADIOLOGY WITH ANESTHESIA N/A 11/13/2015   Procedure: MRI OF LUMBAR SPINE W/WO CONTRAST    (RADIOLOGY WITH ANESTHESIA);  Surgeon: Medication Radiologist, MD;  Location: [place];  Service: Radiology;  Laterality: N/A;   SHOULDER ARTHROSCOPY W/ ROTATOR CUFF REPAIR  ~ 2011   left    Current Medications: Current Meds  Medication Sig   acetaminophen (TYLENOL) 500 MG tablet Take 1,000 mg by mouth every 6 (six) hours as needed for mild pain.    amLODipine (NORVASC) 10 MG tablet Take 10 mg  by mouth daily.   ammonium lactate (LAC-HYDRIN) 12 % lotion    aspirin 81 MG tablet Take 81 mg by mouth daily.   atorvastatin (LIPITOR) 20 MG tablet Take 20 mg by mouth daily.   carvedilol (COREG) 25 MG tablet Take 25 mg by mouth 2 (two) times daily with a meal.   clopidogrel (PLAVIX) 75 MG tablet Take 75 mg by mouth daily with breakfast.   furosemide (LASIX) 20 MG tablet Take 20 mg by mouth daily as needed.   gabapentin (NEURONTIN) 300 MG capsule    hydrochlorothiazide (HYDRODIURIL) 25 MG tablet Take 25 mg by mouth daily.    HYDROmorphone (DILAUDID) 2 MG tablet Take 2 mg by mouth every 4 (four) hours as needed for moderate pain or severe pain.    ibuprofen (ADVIL) 200 MG tablet Take 200 mg by mouth as needed.   levothyroxine (SYNTHROID, LEVOTHROID) 50 MCG tablet Take 50 mcg by mouth daily before breakfast.   losartan (COZAAR) 100 MG tablet Take 100 mg by mouth daily.   metFORMIN (GLUCOPHAGE-XR) 500 MG 24 hr tablet Take 1 tablet (500 mg total) by mouth 2 (two) times daily.   nitroGLYCERIN (NITROSTAT) 0.4 MG SL tablet Place 0.4 mg under the tongue every 5 (five) minutes as needed. For chest pain   sertraline (ZOLOFT) 100 MG tablet Take 100 mg by mouth daily.   traMADol (ULTRAM) 50 MG tablet Take 50 mg by mouth every 4 (four) hours as needed.     Allergies:   Sulfa antibiotics   Social History   Socioeconomic History   Marital status: Single    Spouse name: Not on file   Number of children: Not on file   Years of education: Not on file   Highest education level: Not on file  Occupational History   Not on file  Tobacco Use   Smoking status: Former Smoker   Smokeless tobacco: Current User    Types: Chew  Substance and Sexual Activity   Alcohol use: No   Drug use: No   Sexual activity: Never  Other Topics Concern   Not on file  Social History Narrative   Not on file   Social Determinants of Health   Financial Resource Strain:    Difficulty of  Paying Living Expenses: Not on file  Food Insecurity:    Worried About [place] in the Last Year: Not on file   [employer] of Food in the Last Year: Not on file  Transportation Needs:    Lack of Transportation (Medical): Not on file   Lack of Transportation (Non-Medical): Not on file  Physical Activity:    Days of Exercise per Week: Not on file   Minutes  of Exercise per Session: Not on file  Stress:    Feeling of Stress : Not on file  Social Connections:    Frequency of Communication with Friends and Family: Not on file   Frequency of Social Gatherings with Friends and Family: Not on file   Attends Religious Services: Not on file   Active Member of Clubs or Organizations: Not on file   Attends Archivist Meetings: Not on file   Marital Status: Not on file     Family History: The patient's family history includes Arthritis in his mother; Cancer in his father; Healthy in his sister and sister; Heart attack in his brother; Heart disease in his brother; Hypertension in his brother.  ROS:   Review of Systems  Constitution: Negative for decreased appetite, fever and weight gain.  HENT: Negative for congestion, ear discharge, hoarse voice and sore throat.   Eyes: Negative for discharge, redness, vision loss in right eye and visual halos.  Cardiovascular: Negative for chest pain, dyspnea on exertion, leg swelling, orthopnea and palpitations.  Respiratory: Negative for cough, hemoptysis, shortness of breath and snoring.   Endocrine: Negative for heat intolerance and polyphagia.  Hematologic/Lymphatic: Negative for bleeding problem. Does not bruise/bleed easily.  Skin: Negative for flushing, nail changes, rash and suspicious lesions.  Musculoskeletal: Negative for arthritis, joint pain, muscle cramps, myalgias, neck pain and stiffness.  Gastrointestinal: Negative for abdominal pain, bowel incontinence, diarrhea and excessive appetite.  Genitourinary: Negative  for decreased libido, genital sores and incomplete emptying.  Neurological: Negative for brief paralysis, focal weakness, headaches and loss of balance.  Psychiatric/Behavioral: Negative for altered mental status, depression and suicidal ideas.  Allergic/Immunologic: Negative for HIV exposure and persistent infections.    EKGs/Labs/Other Studies Reviewed:    The following studies were reviewed today:   EKG: None today  Left heart catheterizationLHC 09/30/2019  Prox RCA to Mid RCA lesion is 100% stenosed. There are brisk left to right collaterals.  1st Mrg lesion is 99% stenosed. This is unchanged from 2013.  3rd Mrg lesion is 75% stenosed. This is unchanged from 2013.  Previously placed Mid LAD stent (unknown type) is widely patent.  Prox LAD to Mid LAD lesion is 40% stenosed.  The left ventricular systolic function is normal.  LV end diastolic pressure is normal.  The left ventricular ejection fraction is 55-65% by visual estimate.  There is no aortic valve stenosis. CTO of RCA with left to right collaterals. Patient does not report angina. Continued small vessel disease in the branches of the circumflex- unchanged from prior. Continue medical therapy.    Recent Labs: 09/23/2019: BUN 15; Creatinine, Ser 0.63; Hemoglobin 13.5; Magnesium 1.7; Platelets 252; Potassium 4.4; Sodium 137  Recent Lipid Panel    Component Value Date/Time   CHOL 175 07/31/2011 0352   TRIG 161 (H) 07/31/2011 0352   HDL 38 (L) 07/31/2011 0352   CHOLHDL 4.6 07/31/2011 0352   VLDL 32 07/31/2011 0352   LDLCALC 105 (H) 07/31/2011 0352    Physical Exam:    VS:  BP 132/66    Pulse 62    Ht 5\' 9"  (1.753 m)    Wt 219 lb 6.4 oz (99.5 kg)    SpO2 98%    BMI 32.40 kg/m     Wt Readings from Last 3 Encounters:  03/05/20 219 lb 6.4 oz (99.5 kg)  10/25/19 229 lb (103.9 kg)  09/30/19 225 lb (102.1 kg)     GEN: Well nourished, well developed in no acute  distress HEENT: Normal NECK: No JVD; No  carotid bruits LYMPHATICS: No lymphadenopathy CARDIAC: S1S2 noted,RRR, no murmurs, rubs, gallops RESPIRATORY:  Clear to auscultation without rales, wheezing or rhonchi  ABDOMEN: Soft, non-tender, non-distended, +bowel sounds, no guarding. EXTREMITIES: No edema, No cyanosis, no clubbing MUSCULOSKELETAL:  No deformity  SKIN: Warm and dry NEUROLOGIC:  Alert and oriented x 3, non-focal PSYCHIATRIC:  Normal affect, good insight  ASSESSMENT:    1. CAD in native artery   2. Type 2 diabetes mellitus with hyperosmolarity without coma, without long-term current use of insulin ([place])   3. Mixed hyperlipidemia   4. Essential hypertension, benign    PLAN:    He appears to be doing well from a cardiovascular standpoint.  No changes will be made to his antihypertensive medication regimen.  His diabetes mellitus is being managed by his primary care doctor.  He tells me he recently had blood work by his PCP which I am going to request these records to review his lipid profile.  The patient understands the need to lose weight with diet and exercise. We have discussed specific strategies for this.  However he tells me he is limited by his back pain.  The patient is in agreement with the above plan. The patient left the office in stable condition.  The patient will follow up in 6 months or sooner if needed.   Medication Adjustments/Labs and Tests Ordered: Current medicines are reviewed at length with the patient today.  Concerns regarding medicines are outlined above.  No orders of the defined types were placed in this encounter.  No orders of the defined types were placed in this encounter.   Patient Instructions  Medication Instructions:  Your physician recommends that you continue on your current medications as directed. Please refer to the Current Medication list given to you today.  *If you need a refill on your cardiac medications before your next appointment, please call your  pharmacy*   Lab Work: None If you have labs (blood work) drawn today and your tests are completely normal, you will receive your results only by:  [place] (if you have MyChart) OR  A paper copy in the mail If you have any lab test that is abnormal or we need to change your treatment, we will call you to review the results.   Testing/Procedures: None   Follow-Up: At [facility], you and your health needs are our priority.  As part of our continuing mission to provide you with exceptional heart care, we have created designated Provider Care Teams.  These Care Teams include your primary Cardiologist (physician) and Advanced Practice Providers (APPs -  Physician Assistants and Nurse Practitioners) who all work together to provide you with the care you need, when you need it.  We recommend signing up for the patient portal called "MyChart".  Sign up information is provided on this After Visit Summary.  MyChart is used to connect with patients for Virtual Visits (Telemedicine).  Patients are able to view lab/test results, encounter notes, upcoming appointments, etc.  Non-urgent messages can be sent to your provider as well.   To learn more about what you can do with MyChart, go to [email or web address].    Your next appointment:   6 month(s)  The format for your next appointment:   In Person  Provider:   Berniece Salines, DO   Other Instructions      Adopting a Healthy Lifestyle.  Know what a healthy weight is for you (roughly BMI <  25) and aim to maintain this   Aim for 7+ servings of fruits and vegetables daily   65-80+ fluid ounces of water or unsweet tea for healthy kidneys   Limit to max 1 drink of alcohol per day; avoid smoking/tobacco   Limit animal fats in diet for cholesterol and heart health - choose grass fed whenever available   Avoid highly processed foods, and foods high in saturated/trans fats   Aim for low stress - take time to unwind and care for  your mental health   Aim for 150 min of moderate intensity exercise weekly for heart health, and weights twice weekly for bone health   Aim for 7-9 hours of sleep daily   When it comes to diets, agreement about the perfect plan isnt easy to find, even among the experts. Experts at the [employer] developed an idea known as the Healthy Eating Plate. Just imagine a plate divided into logical, healthy portions.   The emphasis is on diet quality:   Load up on vegetables and fruits - one-half of your plate: Aim for color and variety, and remember that potatoes dont count.   Go for whole grains - one-quarter of your plate: Whole wheat, barley, wheat berries, quinoa, oats, brown rice, and foods made with them. If you want pasta, go with whole wheat pasta.   Protein power - one-quarter of your plate: Fish, chicken, beans, and nuts are all healthy, versatile protein sources. Limit red meat.   The diet, however, does go beyond the plate, offering a few other suggestions.   Use healthy plant oils, such as olive, canola, soy, corn, sunflower and peanut. Check the labels, and avoid partially hydrogenated oil, which have unhealthy trans fats.   If youre thirsty, drink water. Coffee and tea are good in moderation, but skip sugary drinks and limit milk and dairy products to one or two daily servings.   The type of carbohydrate in the diet is more important than the amount. Some sources of carbohydrates, such as vegetables, fruits, whole grains, and beans-are healthier than others.   Finally, stay active  Signed, Berniece Salines, DO  03/05/2020 11:50 AM    [place]

## 2020-03-05 NOTE — Patient Instructions (Signed)

## 2020-09-03 ENCOUNTER — Ambulatory Visit (INDEPENDENT_AMBULATORY_CARE_PROVIDER_SITE_OTHER): Payer: Medicare Other

## 2020-09-03 ENCOUNTER — Other Ambulatory Visit: Payer: Self-pay

## 2020-09-03 ENCOUNTER — Encounter: Payer: Self-pay | Admitting: Cardiology

## 2020-09-03 ENCOUNTER — Ambulatory Visit: Payer: Medicare Other | Admitting: Cardiology

## 2020-09-03 VITALS — BP 142/64 | HR 61 | Ht 69.0 in | Wt 219.8 lb

## 2020-09-03 DIAGNOSIS — I10 Essential (primary) hypertension: Secondary | ICD-10-CM

## 2020-09-03 DIAGNOSIS — E669 Obesity, unspecified: Secondary | ICD-10-CM | POA: Diagnosis not present

## 2020-09-03 DIAGNOSIS — R002 Palpitations: Secondary | ICD-10-CM | POA: Diagnosis not present

## 2020-09-03 DIAGNOSIS — I2510 Atherosclerotic heart disease of native coronary artery without angina pectoris: Secondary | ICD-10-CM | POA: Diagnosis not present

## 2020-09-03 MED ORDER — FUROSEMIDE 20 MG PO TABS: 20.0000 mg | ORAL_TABLET | Freq: Every day | ORAL | 3 refills | Status: DC | PRN

## 2020-09-03 NOTE — Patient Instructions (Signed)
Medication Instructions:  Your physician recommends that you continue on your current medications as directed. Please refer to the Current Medication list given to you today. Refilled Lasix  *If you need a refill on your cardiac medications before your next appointment, please call your pharmacy*   Lab Work: None If you have labs (blood work) drawn today and your tests are completely normal, you will receive your results only by: [place] [facility] MyChart Message (if you have MyChart) OR . A paper copy in the mail If you have any lab test that is abnormal or we need to change your treatment, we will call you to review the results.   Testing/Procedures: A zio monitor was ordered today. It will remain on for 14 days. You will then return monitor and event diary in provided box. It takes 1-2 weeks for report to be downloaded and returned to [place]. We will call you with the results. If monitor falls off or has orange flashing light, please call [employer] for further instructions.      Follow-Up: At [facility], you and your health needs are our priority.  As part of our continuing mission to provide you with exceptional heart care, we have created designated Provider Care Teams.  These Care Teams include your primary Cardiologist (physician) and Advanced Practice Providers (APPs -  Physician Assistants and Nurse Practitioners) who all work together to provide you with the care you need, when you need it.  We recommend signing up for the patient portal called "MyChart".  Sign up information is provided on this After Visit Summary.  MyChart is used to connect with patients for Virtual Visits (Telemedicine).  Patients are able to view lab/test results, encounter notes, upcoming appointments, etc.  Non-urgent messages can be sent to your provider as well.   To learn more about what you can do with MyChart, go to [email or web address].    Your next appointment:   8 week(s)  The format for your next appointment:    In Person  Provider:   Berniece Salines, DO   Other Instructions

## 2020-09-03 NOTE — Progress Notes (Signed)
Cardiology Office Note:    Date:  09/04/2020   ID:  [name], DOB [birth year], MRN [id number]  PCP:  Joel Peak, PA-C  Cardiologist:  Joel Ripple, DO  Electrophysiologist:  None   Referring MD: Joel Peak, PA-C   I have been having more heart rate going crazy  History of Present Illness:    [name] is a 75 y.o. male with a hx of coronary artery disease status post PCI in 2013, most recent heart catheterization was September 30, 2019 with no stent placement and was noted to have CTO of the RCA, 40% LAD lesion and multiple small vessel diseases which had not worsened since 2013.  At his last visit he appeared to be doing well from a cardiovascular standpoint.  He had no complaints.  Today he tells me that he has been experiencing significant palpitations.  He notes that his heart rate is going up and down sometimes is in the low 40s and sometimes he feels that he is just sitting there and he is having significant palpitations.  No chest pain, no shortness of breath.  Past Medical History:  Diagnosis Date  . Anxiety   . Arthritis   . 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HCC) 03/14/2013  . Atypical chest pain 12/28/2013  . Chest pain 07/30/2011   1999 Cath - <50% stenosis 2008 NUC - low risk   . Chronic back pain    herniated disc  . Coronary artery disease    takes Plavix and ASA daily  . Depression    takes Prozac daily  . Diabetes mellitus type 2, controlled, with complications (HCC) 07/30/2011  . Diverticulitis    hx of  . Dizziness    occasionally and states Dr.Smith is aware  . DJD (degenerative joint disease)   . Essential hypertension, benign 07/30/2011  . GERD (gastroesophageal reflux disease)    takes an OTC antacid  . Gout    yrs ago and doesn't take any meds  . Hard of hearing    wears hearing aids  . Headache(784.0)    occasionally  . History of colon polyps    benign  . History of kidney stones   . HNP  (herniated nucleus pulposus), lumbar 06/14/2014  . Hyperlipidemia    takes Atorvastatin daily  . Hypertension    takes Amlodipine,Losartan,and Coreg daily  . Hypothyroidism    takes Synthroid daily  . IBS (irritable bowel syndrome)   . Obesity, unspecified 07/30/2011  . Shortness of breath dyspnea    with exertion  . Tuberculosis    Patient denies Tb  . Type II diabetes mellitus (HCC)    takes Metformin daily  . Unstable angina (HCC) 12/27/2013  . Weakness    numbness and tingling in left leg    Past Surgical History:  Procedure Laterality Date  . ANTERIOR CERVICAL DECOMP/DISCECTOMY FUSION  04/2007   C4-5; C6-7  . BACK SURGERY     "3 times; last time was screws in lower back 10/2008"  . CHOLECYSTECTOMY  1970's  . COLONOSCOPY    . CORONARY ANGIOPLASTY  1999/2013   1 stent  . CYSTOSCOPY    . LEFT HEART CATH AND CORONARY ANGIOGRAPHY N/A 09/30/2019   Procedure: LEFT HEART CATH AND CORONARY ANGIOGRAPHY;  Surgeon: Corky Crafts, MD;  Location: [facility] INVASIVE CV LAB;  Service: Cardiovascular;  Laterality: N/A;  . LEFT HEART CATHETERIZATION WITH CORONARY ANGIOGRAM N/A 08/21/2011   Procedure: LEFT HEART CATHETERIZATION WITH  CORONARY ANGIOGRAM;  Surgeon: Sinclair Grooms, MD;  Location: [facility] CATH LAB;  Service: Cardiovascular;  Laterality: N/A;  . LITHOTRIPSY    . LUMBAR LAMINECTOMY/DECOMPRESSION MICRODISCECTOMY Left 06/14/2014   Procedure: LEFT LUMBAR THREE-FOUR LANINOTOMY AND MICRODISKECTOMY.;  Surgeon: Hosie Spangle, MD;  Location: [facility] NEURO ORS;  Service: Neurosurgery;  Laterality: Left;  left  . PCI  5/09/2-13   LAD  . RADIOLOGY WITH ANESTHESIA N/A 11/13/2015   Procedure: MRI OF LUMBAR SPINE W/WO CONTRAST    (RADIOLOGY WITH ANESTHESIA);  Surgeon: Medication Radiologist, MD;  Location: [facility];  Service: Radiology;  Laterality: N/A;  . SHOULDER ARTHROSCOPY W/ ROTATOR CUFF REPAIR  ~ 2011   left    Current Medications: Current Meds  Medication Sig  . acetaminophen (TYLENOL) 500 MG  tablet Take 1,000 mg by mouth every 6 (six) hours as needed for mild pain.   Marland Kitchen amLODipine (NORVASC) 10 MG tablet Take 10 mg by mouth daily.  Marland Kitchen ammonium lactate (LAC-HYDRIN) 12 % lotion   . aspirin 81 MG tablet Take 81 mg by mouth daily.  Marland Kitchen atorvastatin (LIPITOR) 20 MG tablet Take 20 mg by mouth daily.  . carvedilol (COREG) 25 MG tablet Take 25 mg by mouth 2 (two) times daily with a meal.  . clopidogrel (PLAVIX) 75 MG tablet Take 75 mg by mouth daily with breakfast.  . hydrochlorothiazide (HYDRODIURIL) 25 MG tablet Take 25 mg by mouth daily.   Marland Kitchen HYDROmorphone (DILAUDID) 2 MG tablet Take 2 mg by mouth every 4 (four) hours as needed for moderate pain or severe pain.   Marland Kitchen ibuprofen (ADVIL) 200 MG tablet Take 200 mg by mouth as needed.  Marland Kitchen levothyroxine (SYNTHROID, LEVOTHROID) 50 MCG tablet Take 50 mcg by mouth daily before breakfast.  . losartan (COZAAR) 100 MG tablet Take 100 mg by mouth daily.  . metFORMIN (GLUCOPHAGE-XR) 500 MG 24 hr tablet Take 1 tablet (500 mg total) by mouth 2 (two) times daily.  . nitroGLYCERIN (NITROSTAT) 0.4 MG SL tablet Place 0.4 mg under the tongue every 5 (five) minutes as needed. For chest pain  . sertraline (ZOLOFT) 100 MG tablet Take 100 mg by mouth daily.  . [DISCONTINUED] furosemide (LASIX) 20 MG tablet Take 20 mg by mouth daily as needed.     Allergies:   Sulfa antibiotics   Social History   Socioeconomic History  . Marital status: Single    Spouse name: Not on file  . Number of children: Not on file  . Years of education: Not on file  . Highest education level: Not on file  Occupational History  . Not on file  Tobacco Use  . Smoking status: Former Research scientist (life sciences)  . Smokeless tobacco: Current User    Types: Chew  Substance and Sexual Activity  . Alcohol use: No  . Drug use: No  . Sexual activity: Never  Other Topics Concern  . Not on file  Social History Narrative  . Not on file   Social Determinants of Health   Financial Resource Strain: Not on file   Food Insecurity: Not on file  Transportation Needs: Not on file  Physical Activity: Not on file  Stress: Not on file  Social Connections: Not on file     Family History: The patient's family history includes Arthritis in his mother; Cancer in his father; Healthy in his sister and sister; Heart attack in his brother; Heart disease in his brother; Hypertension in his brother.  ROS:   Review of Systems  Constitution:  Negative for decreased appetite, fever and weight gain.  HENT: Negative for congestion, ear discharge, hoarse voice and sore throat.   Eyes: Negative for discharge, redness, vision loss in right eye and visual halos.  Cardiovascular: Reports palpitations.  Negative for chest pain, dyspnea on exertion, leg swelling, orthopnea... Respiratory: Negative for cough, hemoptysis, shortness of breath and snoring.   Endocrine: Negative for heat intolerance and polyphagia.  Hematologic/Lymphatic: Negative for bleeding problem. Does not bruise/bleed easily.  Skin: Negative for flushing, nail changes, rash and suspicious lesions.  Musculoskeletal: Negative for arthritis, joint pain, muscle cramps, myalgias, neck pain and stiffness.  Gastrointestinal: Negative for abdominal pain, bowel incontinence, diarrhea and excessive appetite.  Genitourinary: Negative for decreased libido, genital sores and incomplete emptying.  Neurological: Negative for brief paralysis, focal weakness, headaches and loss of balance.  Psychiatric/Behavioral: Negative for altered mental status, depression and suicidal ideas.  Allergic/Immunologic: Negative for HIV exposure and persistent infections.    EKGs/Labs/Other Studies Reviewed:    The following studies were reviewed today:   EKG: None today  Left heart catheterizationLHC 09/30/2019  Prox RCA to Mid RCA lesion is 100% stenosed. There are brisk left to right collaterals.  1st Mrg lesion is 99% stenosed. This is unchanged from 2013.  3rd Mrg lesion is  75% stenosed. This is unchanged from 2013.  Previously placed Mid LAD stent (unknown type) is widely patent.  Prox LAD to Mid LAD lesion is 40% stenosed.  The left ventricular systolic function is normal.  LV end diastolic pressure is normal.  The left ventricular ejection fraction is 55-65% by visual estimate.  There is no aortic valve stenosis. CTO of RCA with left to right collaterals. Patient does not report angina. Continued small vessel disease in the branches of the circumflex- unchanged from prior. Continue medical therapy.  Recent Labs: 09/23/2019: BUN 15; Creatinine, Ser 0.63; Hemoglobin 13.5; Magnesium 1.7; Platelets 252; Potassium 4.4; Sodium 137  Recent Lipid Panel    Component Value Date/Time   CHOL 175 07/31/2011 0352   TRIG 161 (H) 07/31/2011 0352   HDL 38 (L) 07/31/2011 0352   CHOLHDL 4.6 07/31/2011 0352   VLDL 32 07/31/2011 0352   LDLCALC 105 (H) 07/31/2011 0352    Physical Exam:    VS:  BP (!) 142/64   Pulse 61   Ht 5\' 9"  (1.753 m)   Wt 219 lb 12.8 oz (99.7 kg)   SpO2 97%   BMI 32.46 kg/m     Wt Readings from Last 3 Encounters:  09/03/20 219 lb 12.8 oz (99.7 kg)  03/05/20 219 lb 6.4 oz (99.5 kg)  10/25/19 229 lb (103.9 kg)     GEN: Well nourished, well developed in no acute distress HEENT: Normal NECK: No JVD; No carotid bruits LYMPHATICS: No lymphadenopathy CARDIAC: S1S2 noted,RRR, no murmurs, rubs, gallops RESPIRATORY:  Clear to auscultation without rales, wheezing or rhonchi  ABDOMEN: Soft, non-tender, non-distended, +bowel sounds, no guarding. EXTREMITIES: Trace bilateral ankle edema, No cyanosis, no clubbing MUSCULOSKELETAL:  No deformity  SKIN: Warm and dry NEUROLOGIC:  Alert and oriented x 3, non-focal PSYCHIATRIC:  Normal affect, good insight  ASSESSMENT:    1. Palpitations   2. Coronary artery disease involving native coronary artery of native heart, unspecified whether angina present   3. Obesity (BMI 30-39.9)   4.  Hypertension, unspecified type    PLAN:    He is having significant palpitations as well as low heart rate he tells me to understand his heart rate excursions I like  to place a ZIO monitor on the patient for 14 days.  Hopefully this can give [place] an understanding about the arrhythmias he may be encountering as well.  No anginal symptoms he will remain on his dual antiplatelet therapy with aspirin and Plavix for now as well as his atorvastatin for his coronary artery disease.  His blood pressure was manually done by me 138/62 mmHg in the office today.   Hyperlipidemia - continue with current statin medication.  He states that he recently had lipid profile which was done at his primary care office with Butch Penny request this.  The patient is in agreement with the above plan. The patient left the office in stable condition.  The patient will follow up in 8 weeks.   Medication Adjustments/Labs and Tests Ordered: Current medicines are reviewed at length with the patient today.  Concerns regarding medicines are outlined above.  Orders Placed This Encounter  Procedures  . LONG TERM MONITOR (3-14 DAYS)   Meds ordered this encounter  Medications  . furosemide (LASIX) 20 MG tablet    Sig: Take 1 tablet (20 mg total) by mouth daily as needed.    Dispense:  60 tablet    Refill:  3    Patient Instructions  Medication Instructions:  Your physician recommends that you continue on your current medications as directed. Please refer to the Current Medication list given to you today. Refilled Lasix  *If you need a refill on your cardiac medications before your next appointment, please call your pharmacy*   Lab Work: None If you have labs (blood work) drawn today and your tests are completely normal, you will receive your results only by: Marland Kitchen MyChart Message (if you have MyChart) OR . A paper copy in the mail If you have any lab test that is abnormal or we need to change your treatment, we will call  you to review the results.   Testing/Procedures: A zio monitor was ordered today. It will remain on for 14 days. You will then return monitor and event diary in provided box. It takes 1-2 weeks for report to be downloaded and returned to [place]. We will call you with the results. If monitor falls off or has orange flashing light, please call Zio for further instructions.      Follow-Up: At [facility], you and your health needs are our priority.  As part of our continuing mission to provide you with exceptional heart care, we have created designated Provider Care Teams.  These Care Teams include your primary Cardiologist (physician) and Advanced Practice Providers (APPs -  Physician Assistants and Nurse Practitioners) who all work together to provide you with the care you need, when you need it.  We recommend signing up for the patient portal called "MyChart".  Sign up information is provided on this After Visit Summary.  MyChart is used to connect with patients for Virtual Visits (Telemedicine).  Patients are able to view lab/test results, encounter notes, upcoming appointments, etc.  Non-urgent messages can be sent to your provider as well.   To learn more about what you can do with MyChart, go to [email or web address].    Your next appointment:   8 week(s)  The format for your next appointment:   In Person  Provider:   Berniece Salines, DO   Other Instructions      Adopting a Healthy Lifestyle.  Know what a healthy weight is for you (roughly BMI <25) and aim to maintain this   Aim for 7+  servings of fruits and vegetables daily   65-80+ fluid ounces of water or unsweet tea for healthy kidneys   Limit to max 1 drink of alcohol per day; avoid smoking/tobacco   Limit animal fats in diet for cholesterol and heart health - choose grass fed whenever available   Avoid highly processed foods, and foods high in saturated/trans fats   Aim for low stress - take time to unwind and care  for your mental health   Aim for 150 min of moderate intensity exercise weekly for heart health, and weights twice weekly for bone health   Aim for 7-9 hours of sleep daily   When it comes to diets, agreement about the perfect plan isnt easy to find, even among the experts. Experts at the [employer] developed an idea known as the Healthy Eating Plate. Just imagine a plate divided into logical, healthy portions.   The emphasis is on diet quality:   Load up on vegetables and fruits - one-half of your plate: Aim for color and variety, and remember that potatoes dont count.   Go for whole grains - one-quarter of your plate: Whole wheat, barley, wheat berries, quinoa, oats, brown rice, and foods made with them. If you want pasta, go with whole wheat pasta.   Protein power - one-quarter of your plate: Fish, chicken, beans, and nuts are all healthy, versatile protein sources. Limit red meat.   The diet, however, does go beyond the plate, offering a few other suggestions.   Use healthy plant oils, such as olive, canola, soy, corn, sunflower and peanut. Check the labels, and avoid partially hydrogenated oil, which have unhealthy trans fats.   If youre thirsty, drink water. Coffee and tea are good in moderation, but skip sugary drinks and limit milk and dairy products to one or two daily servings.   The type of carbohydrate in the diet is more important than the amount. Some sources of carbohydrates, such as vegetables, fruits, whole grains, and beans-are healthier than others.   Finally, stay active  Signed, Joel Salines, DO  09/04/2020 1:11 PM    [place]

## 2020-09-04 DIAGNOSIS — Z0289 Encounter for other administrative examinations: Secondary | ICD-10-CM

## 2020-09-04 DIAGNOSIS — M961 Postlaminectomy syndrome, not elsewhere classified: Secondary | ICD-10-CM

## 2020-09-04 DIAGNOSIS — Z981 Arthrodesis status: Secondary | ICD-10-CM

## 2020-09-04 HISTORY — DX: Arthrodesis status: Z98.1

## 2020-09-04 HISTORY — DX: Encounter for other administrative examinations: Z02.89

## 2020-09-04 HISTORY — DX: Postlaminectomy syndrome, not elsewhere classified: M96.1

## 2020-09-25 NOTE — Progress Notes (Signed)
Spoke with patient, verberilzed understanding and had no questions at this time.

## 2020-10-12 ENCOUNTER — Encounter: Payer: Self-pay | Admitting: Cardiology

## 2020-10-12 ENCOUNTER — Other Ambulatory Visit: Payer: Self-pay

## 2020-10-12 ENCOUNTER — Ambulatory Visit: Payer: Medicare Other | Admitting: Cardiology

## 2020-10-12 VITALS — BP 130/72 | HR 60 | Ht 69.0 in | Wt 221.0 lb

## 2020-10-12 DIAGNOSIS — E118 Type 2 diabetes mellitus with unspecified complications: Secondary | ICD-10-CM | POA: Diagnosis not present

## 2020-10-12 DIAGNOSIS — R6889 Other general symptoms and signs: Secondary | ICD-10-CM

## 2020-10-12 DIAGNOSIS — I10 Essential (primary) hypertension: Secondary | ICD-10-CM | POA: Diagnosis not present

## 2020-10-12 DIAGNOSIS — M79605 Pain in left leg: Secondary | ICD-10-CM

## 2020-10-12 DIAGNOSIS — I2510 Atherosclerotic heart disease of native coronary artery without angina pectoris: Secondary | ICD-10-CM

## 2020-10-12 DIAGNOSIS — M79604 Pain in right leg: Secondary | ICD-10-CM

## 2020-10-12 DIAGNOSIS — I472 Ventricular tachycardia: Secondary | ICD-10-CM | POA: Diagnosis not present

## 2020-10-12 DIAGNOSIS — I4719 Other supraventricular tachycardia: Secondary | ICD-10-CM | POA: Insufficient documentation

## 2020-10-12 DIAGNOSIS — I4729 Other ventricular tachycardia: Secondary | ICD-10-CM

## 2020-10-12 DIAGNOSIS — I471 Supraventricular tachycardia: Secondary | ICD-10-CM

## 2020-10-12 HISTORY — DX: Other supraventricular tachycardia: I47.19

## 2020-10-12 NOTE — Patient Instructions (Addendum)
Medication Instructions:  Your physician recommends that you continue on your current medications as directed. Please refer to the Current Medication list given to you today.  *If you need a refill on your cardiac medications before your next appointment, please call your pharmacy*   Lab Work: None If you have labs (blood work) drawn today and your tests are completely normal, you will receive your results only by: [place] (if you have MyChart) OR A paper copy in the mail If you have any lab test that is abnormal or we need to change your treatment, we will call you to review the results.   Testing/Procedures: Your physician has requested that you have a lower extremity arterial duplex. This test is an ultrasound of the arteries in the legs or arms. It looks at arterial blood flow in the legs. Allow one hour for Lower Arterial scans. There are no restrictions or special instructions.    Follow-Up: At [facility] you and your health needs are our priority.  As part of our continuing mission to provide you with exceptional heart care, we have created designated Provider Care Teams.  These Care Teams include your primary Cardiologist (physician) and Advanced Practice Providers (APPs -  Physician Assistants and Nurse Practitioners) who all work together to provide you with the care you need, when you need it.  We recommend signing up for the patient portal called "MyChart".  Sign up information is provided on this After Visit Summary.  MyChart is used to connect with patients for Virtual Visits (Telemedicine).  Patients are able to view lab/test results, encounter notes, upcoming appointments, etc.  Non-urgent messages can be sent to your provider as well.   To learn more about what you can do with MyChart, go to [email or web address].    Your next appointment:   6 month(s)  The format for your next appointment:   In Person  Provider:   Jenne Campus, MD   Other  Instructions

## 2020-10-12 NOTE — Progress Notes (Signed)
Cardiology Office Note:    Date:  10/12/2020   ID:  [name], DOB 03-Feb-1946, MRN [id number]  PCP:  Cyndi Bender, PA-C  Cardiologist:  Berniece Salines, DO  Electrophysiologist:  None   Referring MD: Cyndi Bender, PA-C   Chief Complaint  Patient presents with   Follow-up    History of Present Illness:    [name] is a 75 y.o. male with a hx of coronary artery disease status post PCI in 2013, most recent heart catheterization was September 30, 2019 with no stent placement and was noted to have CTO of the RCA, 40% LAD lesion and multiple small vessel diseases which had not worsened since 2013.  I saw the patient on Sep 03, 2020 at that time he was experiencing palpitations we do have a place a monitor on the patient.    Today he is here to discuss his test result, he also does have information from his home screening which showed evidence of abnormal ABIs.  He tells me he does have bilateral leg pain when he walks and is getting worse.  No other complaints at this time.    Past Medical History:  Diagnosis Date   Anxiety    Arthritis    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[place]) 03/14/2013   Atypical chest pain 12/28/2013   Chest pain 07/30/2011   1999 Cath - <50% stenosis 2008 NUC - low risk    Chronic back pain    herniated disc   Coronary artery disease    takes Plavix and ASA daily   Depression    takes Prozac daily   Diabetes mellitus type 2, controlled, with complications ([place]) 0/62/3762   Diverticulitis    hx of   Dizziness    occasionally and states Dr.Smith is aware   DJD (degenerative joint disease)    Essential hypertension, benign 07/30/2011   GERD (gastroesophageal reflux disease)    takes an OTC antacid   Gout    yrs ago and doesn't take any meds   Hard of hearing    wears hearing aids   Headache(784.0)    occasionally   History of colon polyps    benign   History of kidney stones    HNP (herniated nucleus  pulposus), lumbar 06/14/2014   Hyperlipidemia    takes Atorvastatin daily   Hypertension    takes Amlodipine,Losartan,and Coreg daily   Hypothyroidism    takes Synthroid daily   IBS (irritable bowel syndrome)    Obesity, unspecified 07/30/2011   Shortness of breath dyspnea    with exertion   Tuberculosis    Patient denies Tb   Type II diabetes mellitus ([place])    takes Metformin daily   Unstable angina ([place]) 12/27/2013   Weakness    numbness and tingling in left leg    Past Surgical History:  Procedure Laterality Date   ANTERIOR CERVICAL DECOMP/DISCECTOMY FUSION  04/2007   C4-5; C6-7   BACK SURGERY     "3 times; last time was screws in lower back 10/2008"   CHOLECYSTECTOMY  1970's   COLONOSCOPY     CORONARY ANGIOPLASTY  1999/2013   1 stent   CYSTOSCOPY     LEFT HEART CATH AND CORONARY ANGIOGRAPHY N/A 09/30/2019   Procedure: LEFT HEART CATH AND CORONARY ANGIOGRAPHY;  Surgeon: Jettie Booze, MD;  Location: [facility];  Service: Cardiovascular;  Laterality: N/A;   LEFT HEART CATHETERIZATION WITH CORONARY ANGIOGRAM N/A 08/21/2011  Procedure: LEFT HEART CATHETERIZATION WITH CORONARY ANGIOGRAM;  Surgeon: Sinclair Grooms, MD;  Location:  [facility];  Service: Cardiovascular;  Laterality: N/A;   LITHOTRIPSY     LUMBAR LAMINECTOMY/DECOMPRESSION MICRODISCECTOMY Left 06/14/2014   Procedure: LEFT LUMBAR THREE-FOUR LANINOTOMY AND MICRODISKECTOMY.;  Surgeon: Hosie Spangle, MD;  Location: [facility];  Service: Neurosurgery;  Laterality: Left;  left   PCI  5/09/2-13   LAD   RADIOLOGY WITH ANESTHESIA N/A 11/13/2015   Procedure: MRI OF LUMBAR SPINE W/WO CONTRAST    (RADIOLOGY WITH ANESTHESIA);  Surgeon: Medication Radiologist, MD;  Location: [facility];  Service: Radiology;  Laterality: N/A;   SHOULDER ARTHROSCOPY W/ ROTATOR CUFF REPAIR  ~ 2011   left    Current Medications: Current Meds  Medication Sig   acetaminophen (TYLENOL) 500 MG tablet Take 1,000 mg by mouth every 6 (six)  hours as needed for mild pain.    amLODipine (NORVASC) 10 MG tablet Take 10 mg by mouth daily.   ammonium lactate (LAC-HYDRIN) 12 % lotion Apply 1 application topically as needed for dry skin.   aspirin 81 MG tablet Take 81 mg by mouth daily.   atorvastatin (LIPITOR) 20 MG tablet Take 20 mg by mouth daily.   carvedilol (COREG) 25 MG tablet Take 12.5 mg by mouth 2 (two) times daily with a meal.   clopidogrel (PLAVIX) 75 MG tablet Take 75 mg by mouth daily with breakfast.   hydrochlorothiazide (HYDRODIURIL) 25 MG tablet Take 25 mg by mouth daily.    HYDROmorphone (DILAUDID) 2 MG tablet Take 2 mg by mouth every 4 (four) hours as needed for moderate pain or severe pain.    ibuprofen (ADVIL) 200 MG tablet Take 200 mg by mouth as needed for mild pain.   levothyroxine (SYNTHROID, LEVOTHROID) 50 MCG tablet Take 50 mcg by mouth daily before breakfast.   losartan (COZAAR) 100 MG tablet Take 100 mg by mouth daily.   metFORMIN (GLUCOPHAGE-XR) 500 MG 24 hr tablet Take 1 tablet (500 mg total) by mouth 2 (two) times daily.   nitroGLYCERIN (NITROSTAT) 0.4 MG SL tablet Place 0.4 mg under the tongue every 5 (five) minutes as needed. For chest pain   sertraline (ZOLOFT) 100 MG tablet Take 100 mg by mouth daily.     Allergies:   Sulfa antibiotics   Social History   Socioeconomic History   Marital status: Single    Spouse name: Not on file   Number of children: Not on file   Years of education: Not on file   Highest education level: Not on file  Occupational History   Not on file  Tobacco Use   Smoking status: Former    Pack years: 0.00   Smokeless tobacco: Current    Types: Chew  Substance and Sexual Activity   Alcohol use: No   Drug use: No   Sexual activity: Never  Other Topics Concern   Not on file  Social History Narrative   Not on file   Social Determinants of Health   Financial Resource Strain: Not on file  Food Insecurity: Not on file  Transportation Needs: Not on file  Physical  Activity: Not on file  Stress: Not on file  Social Connections: Not on file     Family History: The patient's family history includes Arthritis in his mother; Cancer in his father; Healthy in his sister and sister; Heart attack in his brother; Heart disease in his brother; Hypertension in his brother.  ROS:   Review  of Systems  Constitution: Negative for decreased appetite, fever and weight gain.  HENT: Negative for congestion, ear discharge, hoarse voice and sore throat.   Eyes: Negative for discharge, redness, vision loss in right eye and visual halos.  Cardiovascular: Negative for chest pain, dyspnea on exertion, leg swelling, orthopnea and palpitations.  Respiratory: Negative for cough, hemoptysis, shortness of breath and snoring.   Endocrine: Negative for heat intolerance and polyphagia.  Hematologic/Lymphatic: Negative for bleeding problem. Does not bruise/bleed easily.  Skin: Negative for flushing, nail changes, rash and suspicious lesions.  Musculoskeletal: Negative for arthritis, joint pain, muscle cramps, myalgias, neck pain and stiffness.  Gastrointestinal: Negative for abdominal pain, bowel incontinence, diarrhea and excessive appetite.  Genitourinary: Negative for decreased libido, genital sores and incomplete emptying.  Neurological: Negative for brief paralysis, focal weakness, headaches and loss of balance.  Psychiatric/Behavioral: Negative for altered mental status, depression and suicidal ideas.  Allergic/Immunologic: Negative for HIV exposure and persistent infections.    EKGs/Labs/Other Studies Reviewed:    The following studies were reviewed today:   EKG: None today  Zio monitor Patch Wear Time:  13 days and 20 hours Sep 03, 2020 Indications: Palpitations   Patient had a minimal HR of 45 bpm, maximum HR of 167 bpm, and average HR of 59 bpm.   Predominant underlying rhythm was Sinus Rhythm.   2 Ventricular Tachycardia runs occurred, the run with the  fastest interval lasting 6 beats with a maximum rate of 152 bpm, the longest lasting 8 beats with an avg rate of 130 bpm. 34 Supraventricular Tachycardia runs occurred, the run with the fastest interval lasting 12 beats with a maximum rate of 167 bpm, the longest lasting 34.9 secs with an average rate of 91 bpm.   Premature atrial complexes were rare. Premature ventricular complexes were rare.   No triggered events or diary events noted.   Conclusion: This study is remarkable for the following            1.  Nonsustained ventricular tachycardia            2.  Paroxysmal supraventricular tachycardia which is likely atrial tachycardia with variable block.  Left heart catheterizationLHC 09/30/2019 Prox RCA to Mid RCA lesion is 100% stenosed. There are brisk left to right collaterals. 1st Mrg lesion is 99% stenosed. This is unchanged from 2013. 3rd Mrg lesion is 75% stenosed. This is unchanged from 2013. Previously placed Mid LAD stent (unknown type) is widely patent. Prox LAD to Mid LAD lesion is 40% stenosed. The left ventricular systolic function is normal. LV end diastolic pressure is normal. The left ventricular ejection fraction is 55-65% by visual estimate. There is no aortic valve stenosis.  CTO of RCA with left to right collaterals.  Patient does not report angina.  Continued small vessel disease in the branches of the circumflex- unchanged from prior.  Continue medical therapy.  Recent Labs: No results found for requested labs within last 8760 hours.  Recent Lipid Panel    Component Value Date/Time   CHOL 175 07/31/2011 0352   TRIG 161 (H) 07/31/2011 0352   HDL 38 (L) 07/31/2011 0352   CHOLHDL 4.6 07/31/2011 0352   VLDL 32 07/31/2011 0352   LDLCALC 105 (H) 07/31/2011 0352    Physical Exam:    VS:  BP 130/72 (BP Location: Right Arm, Patient Position: Sitting, Cuff Size: Normal)   Pulse 60   Ht 5\' 9"  (1.753 m)   Wt 221 lb (100.2 kg)   SpO2 94%  BMI 32.64 kg/m     Wt  Readings from Last 3 Encounters:  10/12/20 221 lb (100.2 kg)  09/03/20 219 lb 12.8 oz (99.7 kg)  03/05/20 219 lb 6.4 oz (99.5 kg)     GEN: Well nourished, well developed in no acute distress HEENT: Normal NECK: No JVD; No carotid bruits LYMPHATICS: No lymphadenopathy CARDIAC: S1S2 noted,RRR, no murmurs, rubs, gallops RESPIRATORY:  Clear to auscultation without rales, wheezing or rhonchi  ABDOMEN: Soft, non-tender, non-distended, +bowel sounds, no guarding. EXTREMITIES: No edema, No cyanosis, no clubbing MUSCULOSKELETAL:  No deformity  SKIN: Warm and dry NEUROLOGIC:  Alert and oriented x 3, non-focal PSYCHIATRIC:  Normal affect, good insight  ASSESSMENT:    1. Essential hypertension, benign   2. CAD in native artery   3. Controlled type 2 diabetes mellitus with complication, without long-term current use of insulin ([place])   4. NSVT (nonsustained ventricular tachycardia) ([place])   5. PAT (paroxysmal atrial tachycardia) ([facility])   6. Pain in both lower extremities   7. Abnormal ankle brachial index (ABI)    PLAN:     His home screening which was done by the insurance company which show evidence of moderate stenosis in his left leg.  The patient does complain that he has been having some leg pain and getting ahead and get arterial Doppler bilaterally.  We will rule out Peripheral artery disease as he does have risk factor for this.  No anginal symptoms continue his current medication regimen.  For now have asked the patient to continue his aspirin and Plavix.  Continue his atorvastatin.  Blood pressure is acceptable.  I talked to the patient about his monitor results which showed episodes of NSVT and paroxysmal atrial tachycardia.  He is on a beta-blocker we will continue to monitor the patient on this.  This is being managed by his primary care doctor.  No adjustments for antidiabetic medications were made today.  The patient understands the need to lose weight with diet and exercise.  We have discussed specific strategies for this.  The patient is in agreement with the above plan. The patient left the office in stable condition.  The patient will follow up in 6 months or sooner if needed.   Medication Adjustments/Labs and Tests Ordered: Current medicines are reviewed at length with the patient today.  Concerns regarding medicines are outlined above.  Orders Placed This Encounter  Procedures   VAS [place] LOWER EXTREMITY ARTERIAL DUPLEX    No orders of the defined types were placed in this encounter.   Patient Instructions  Medication Instructions:  Your physician recommends that you continue on your current medications as directed. Please refer to the Current Medication list given to you today.  *If you need a refill on your cardiac medications before your next appointment, please call your pharmacy*   Lab Work: None If you have labs (blood work) drawn today and your tests are completely normal, you will receive your results only by: [place] (if you have MyChart) OR A paper copy in the mail If you have any lab test that is abnormal or we need to change your treatment, we will call you to review the results.   Testing/Procedures: Your physician has requested that you have a lower extremity arterial duplex. This test is an ultrasound of the arteries in the legs or arms. It looks at arterial blood flow in the legs. Allow one hour for Lower Arterial scans. There are no restrictions or special instructions.  Follow-Up: At [facility], you and your health needs are our priority.  As part of our continuing mission to provide you with exceptional heart care, we have created designated Provider Care Teams.  These Care Teams include your primary Cardiologist (physician) and Advanced Practice Providers (APPs -  Physician Assistants and Nurse Practitioners) who all work together to provide you with the care you need, when you need it.  We recommend signing up for  the patient portal called "MyChart".  Sign up information is provided on this After Visit Summary.  MyChart is used to connect with patients for Virtual Visits (Telemedicine).  Patients are able to view lab/test results, encounter notes, upcoming appointments, etc.  Non-urgent messages can be sent to your provider as well.   To learn more about what you can do with MyChart, go to [email or web address].    Your next appointment:   6 month(s)  The format for your next appointment:   In Person  Provider:   Jenne Campus, MD   Other Instructions    Adopting a Healthy Lifestyle.  Know what a healthy weight is for you (roughly BMI <25) and aim to maintain this   Aim for 7+ servings of fruits and vegetables daily   65-80+ fluid ounces of water or unsweet tea for healthy kidneys   Limit to max 1 drink of alcohol per day; avoid smoking/tobacco   Limit animal fats in diet for cholesterol and heart health - choose grass fed whenever available   Avoid highly processed foods, and foods high in saturated/trans fats   Aim for low stress - take time to unwind and care for your mental health   Aim for 150 min of moderate intensity exercise weekly for heart health, and weights twice weekly for bone health   Aim for 7-9 hours of sleep daily   When it comes to diets, agreement about the perfect plan isnt easy to find, even among the experts. Experts at the [employer] developed an idea known as the Healthy Eating Plate. Just imagine a plate divided into logical, healthy portions.   The emphasis is on diet quality:   Load up on vegetables and fruits - one-half of your plate: Aim for color and variety, and remember that potatoes dont count.   Go for whole grains - one-quarter of your plate: Whole wheat, barley, wheat berries, quinoa, oats, brown rice, and foods made with them. If you want pasta, go with whole wheat pasta.   Protein power - one-quarter of your plate:  Fish, chicken, beans, and nuts are all healthy, versatile protein sources. Limit red meat.   The diet, however, does go beyond the plate, offering a few other suggestions.   Use healthy plant oils, such as olive, canola, soy, corn, sunflower and peanut. Check the labels, and avoid partially hydrogenated oil, which have unhealthy trans fats.   If youre thirsty, drink water. Coffee and tea are good in moderation, but skip sugary drinks and limit milk and dairy products to one or two daily servings.   The type of carbohydrate in the diet is more important than the amount. Some sources of carbohydrates, such as vegetables, fruits, whole grains, and beans-are healthier than others.   Finally, stay active  Signed, Berniece Salines, DO  10/12/2020 3:49 PM    [facility]

## 2020-11-02 ENCOUNTER — Other Ambulatory Visit: Payer: Self-pay

## 2020-11-02 ENCOUNTER — Ambulatory Visit (INDEPENDENT_AMBULATORY_CARE_PROVIDER_SITE_OTHER): Payer: Medicare Other

## 2020-11-02 DIAGNOSIS — M79604 Pain in right leg: Secondary | ICD-10-CM

## 2020-11-02 DIAGNOSIS — R6889 Other general symptoms and signs: Secondary | ICD-10-CM

## 2020-11-02 DIAGNOSIS — M79605 Pain in left leg: Secondary | ICD-10-CM | POA: Diagnosis not present

## 2020-11-02 NOTE — Progress Notes (Signed)
Bilateral lower extremity arterial duplex exam performed.  Jimmy Hadyn Blanck RDCS, RVT 

## 2020-11-13 ENCOUNTER — Telehealth: Payer: Self-pay

## 2020-11-13 NOTE — Telephone Encounter (Signed)
-----   Message from Berniece Salines, DO sent at 11/12/2020  4:47 PM EDT ----- No evidence of significant stenosis.

## 2020-11-13 NOTE — Telephone Encounter (Signed)
Spoke with patient regarding results and recommendation.  Patient verbalizes understanding and is agreeable to plan of care. Advised patient to call back with any issues or concerns.  

## 2020-11-13 NOTE — Telephone Encounter (Signed)
Left message on patients voicemail to please return our call.   

## 2020-11-13 NOTE — Telephone Encounter (Signed)
    Pt is returning call, pt requested to call back on her cell# [phone]

## 2021-02-12 DIAGNOSIS — M47896 Other spondylosis, lumbar region: Secondary | ICD-10-CM

## 2021-02-12 DIAGNOSIS — F1190 Opioid use, unspecified, uncomplicated: Secondary | ICD-10-CM | POA: Insufficient documentation

## 2021-02-12 HISTORY — DX: Other spondylosis, lumbar region: M47.896

## 2021-04-23 DIAGNOSIS — H905 Unspecified sensorineural hearing loss: Secondary | ICD-10-CM

## 2021-04-23 HISTORY — DX: Unspecified sensorineural hearing loss: H90.5

## 2021-04-25 ENCOUNTER — Other Ambulatory Visit: Payer: Self-pay

## 2021-04-25 ENCOUNTER — Encounter: Payer: Self-pay | Admitting: Cardiology

## 2021-04-25 ENCOUNTER — Ambulatory Visit: Payer: Medicare Other | Admitting: Cardiology

## 2021-04-25 VITALS — BP 144/70 | HR 60 | Ht 69.0 in | Wt 228.2 lb

## 2021-04-25 DIAGNOSIS — I2510 Atherosclerotic heart disease of native coronary artery without angina pectoris: Secondary | ICD-10-CM | POA: Diagnosis not present

## 2021-04-25 DIAGNOSIS — E782 Mixed hyperlipidemia: Secondary | ICD-10-CM

## 2021-04-25 DIAGNOSIS — E118 Type 2 diabetes mellitus with unspecified complications: Secondary | ICD-10-CM

## 2021-04-25 DIAGNOSIS — I471 Supraventricular tachycardia: Secondary | ICD-10-CM

## 2021-04-25 NOTE — Progress Notes (Signed)
Cardiology Office Note:    Date:  04/25/2021   ID:  [name], DOB 1945/09/08, MRN [id number]  PCP:  Cyndi Bender, PA-C  Cardiologist:  Jenne Campus, MD    Referring MD: Cyndi Bender, PA-C   Chief Complaint  Patient presents with   HR fluctuation    History of Present Illness:    [name] is a 76 y.o. male with past medical history significant for coronary artery disease.  In 2013 he did have PTCA of unknown artery.  Last cardiac catheterization he had done was in September 30, 2019 but he did not require any intervention he was find to have complete occlusion of RCA 40% LAD with multiple small vessel disease which had not worsened since 2013.  He was followed by my colleague Dr. Harriet Masson.  Recent focus of investigation was arterial study of lower extremities sinc he had some screening done which showed potentially at least moderate disease in his lower extremities, after that arterial duplex evaluation did not show critical lesions in his lower extremities.  I think his symptomatology most likely is related to his chronic back problem with 5 surgeries that he got on his back.  He does have quite a decent dorsalis pedis and posterior tibial artery.   He tells me that most of the time he is doing well.  He have difficulty moving and walking well because of problem with the back and legs he is using walker his been doing this for last 2 years he is still out fishing but now he cannot do it because of pain in the leg.  Denies have any chest pain tightness squeezing pressure burning chest.  Past Medical History:  Diagnosis Date   Anxiety    Arthritis    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[place]) 03/14/2013   Atypical chest pain 12/28/2013   Chest pain 07/30/2011   1999 Cath - <50% stenosis 2008 NUC - low risk    Chronic back pain    herniated disc   Coronary artery disease    takes Plavix and ASA daily   Depression    takes Prozac daily    Diabetes mellitus type 2, controlled, with complications ([place]) 2/70/6237   Diverticulitis    hx of   Dizziness    occasionally and states Dr.Smith is aware   DJD (degenerative joint disease)    Essential hypertension, benign 07/30/2011   GERD (gastroesophageal reflux disease)    takes an OTC antacid   Gout    yrs ago and doesn't take any meds   Hard of hearing    wears hearing aids   Headache(784.0)    occasionally   History of colon polyps    benign   History of kidney stones    HNP (herniated nucleus pulposus), lumbar 06/14/2014   Hyperlipidemia    takes Atorvastatin daily   Hypertension    takes Amlodipine,Losartan,and Coreg daily   Hypothyroidism    takes Synthroid daily   IBS (irritable bowel syndrome)    Obesity, unspecified 07/30/2011   Shortness of breath dyspnea    with exertion   Tuberculosis    Patient denies Tb   Type II diabetes mellitus ([place])    takes Metformin daily   Unstable angina ([place]) 12/27/2013   Weakness    numbness and tingling in left leg    Past Surgical History:  Procedure Laterality Date   ANTERIOR CERVICAL DECOMP/DISCECTOMY FUSION  04/2007   C4-5; C6-7  BACK SURGERY     "3 times; last time was screws in lower back 10/2008"   CHOLECYSTECTOMY  1970's   COLONOSCOPY     CORONARY ANGIOPLASTY  1999/2013   1 stent   CYSTOSCOPY     LEFT HEART CATH AND CORONARY ANGIOGRAPHY N/A 09/30/2019   Procedure: LEFT HEART CATH AND CORONARY ANGIOGRAPHY;  Surgeon: Jettie Booze, MD;  Location: [place];  Service: Cardiovascular;  Laterality: N/A;   LEFT HEART CATHETERIZATION WITH CORONARY ANGIOGRAM N/A 08/21/2011   Procedure: LEFT HEART CATHETERIZATION WITH CORONARY ANGIOGRAM;  Surgeon: Sinclair Grooms, MD;  Location: [facility] CATH LAB;  Service: Cardiovascular;  Laterality: N/A;   LITHOTRIPSY     LUMBAR LAMINECTOMY/DECOMPRESSION MICRODISCECTOMY Left 06/14/2014   Procedure: LEFT LUMBAR THREE-FOUR LANINOTOMY AND MICRODISKECTOMY.;  Surgeon: Hosie Spangle, MD;  Location: [place] [facility]URO ORS;  Service: Neurosurgery;  Laterality: Left;  left   PCI  5/09/2-13   LAD   RADIOLOGY WITH ANESTHESIA N/A 11/13/2015   Procedure: MRI OF LUMBAR SPINE W/WO CONTRAST    (RADIOLOGY WITH ANESTHESIA);  Surgeon: Medication Radiologist, MD;  Location: [facility];  Service: Radiology;  Laterality: N/A;   SHOULDER ARTHROSCOPY W/ ROTATOR CUFF REPAIR  ~ 2011   left    Current Medications: Current Meds  Medication Sig   acetaminophen (TYLENOL) 500 MG tablet Take 1,000 mg by mouth every 6 (six) hours as needed for mild pain.    amLODipine (NORVASC) 10 MG tablet Take 10 mg by mouth daily.   ammonium lactate (LAC-HYDRIN) 12 % lotion Apply 1 application topically as needed for dry skin.   aspirin 81 MG tablet Take 81 mg by mouth daily.   atorvastatin (LIPITOR) 20 MG tablet Take 20 mg by mouth daily.   carvedilol (COREG) 25 MG tablet Take 12.5 mg by mouth 2 (two) times daily with a meal.   clopidogrel (PLAVIX) 75 MG tablet Take 75 mg by mouth daily with breakfast.   hydrochlorothiazide (HYDRODIURIL) 25 MG tablet Take 25 mg by mouth daily.    HYDROmorphone (DILAUDID) 2 MG tablet Take 2 mg by mouth every 4 (four) hours as needed for moderate pain or severe pain.    ibuprofen (ADVIL) 200 MG tablet Take 200 mg by mouth as needed for mild pain.   levothyroxine (SYNTHROID, LEVOTHROID) 50 MCG tablet Take 50 mcg by mouth daily before breakfast.   losartan (COZAAR) 100 MG tablet Take 100 mg by mouth daily.   metFORMIN (GLUCOPHAGE-XR) 500 MG 24 hr tablet Take 1 tablet (500 mg total) by mouth 2 (two) times daily.   nitroGLYCERIN (NITROSTAT) 0.4 MG SL tablet Place 0.4 mg under the tongue every 5 (five) minutes as needed. For chest pain   sertraline (ZOLOFT) 100 MG tablet Take 100 mg by mouth daily.     Allergies:   Sulfa antibiotics   Social History   Socioeconomic History   Marital status: Single    Spouse name: Not on file   Number of children: Not on file   Years of  education: Not on file   Highest education level: Not on file  Occupational History   Not on file  Tobacco Use   Smoking status: Former   Smokeless tobacco: Current    Types: Chew  Substance and Sexual Activity   Alcohol use: No   Drug use: No   Sexual activity: Never  Other Topics Concern   Not on file  Social History Narrative   Not on file   Social  Determinants of Health   Financial Resource Strain: Not on file  Food Insecurity: Not on file  Transportation Needs: Not on file  Physical Activity: Not on file  Stress: Not on file  Social Connections: Not on file     Family History: The patient's family history includes Arthritis in his mother; Cancer in his father; Healthy in his sister and sister; Heart attack in his brother; Heart disease in his brother; Hypertension in his brother. ROS:   Please see the history of present illness.    All 14 point review of systems negative except as described per history of present illness  EKGs/Labs/Other Studies Reviewed:      Recent Labs: No results found for requested labs within last 8760 hours.  Recent Lipid Panel    Component Value Date/Time   CHOL 175 07/31/2011 0352   TRIG 161 (H) 07/31/2011 0352   HDL 38 (L) 07/31/2011 0352   CHOLHDL 4.6 07/31/2011 0352   VLDL 32 07/31/2011 0352   LDLCALC 105 (H) 07/31/2011 0352    Physical Exam:    VS:  BP (!) 144/70 (BP Location: Left Arm, Patient Position: Sitting)    Pulse 60    Ht 5\' 9"  (1.753 m)    Wt 228 lb 3.2 oz (103.5 kg)    SpO2 97%    BMI 33.70 kg/m     Wt Readings from Last 3 Encounters:  04/25/21 228 lb 3.2 oz (103.5 kg)  10/12/20 221 lb (100.2 kg)  09/03/20 219 lb 12.8 oz (99.7 kg)     GEN:  Well nourished, well developed in no acute distress HEENT: Normal NECK: No JVD; No carotid bruits LYMPHATICS: No lymphadenopathy CARDIAC: RRR, no murmurs, no rubs, no gallops RESPIRATORY:  Clear to auscultation without rales, wheezing or rhonchi  ABDOMEN: Soft,  non-tender, non-distended MUSCULOSKELETAL:  No edema; No deformity  SKIN: Warm and dry LOWER EXTREMITIES: no swelling NEUROLOGIC:  Alert and oriented x 3 PSYCHIATRIC:  Normal affect   ASSESSMENT:    1. Coronary artery disease involving native coronary artery of native heart without angina pectoris   2. PAT (paroxysmal atrial tachycardia) ([place])   3. Controlled type 2 diabetes mellitus with complication, without long-term current use of insulin ([place])   4. Mixed hyperlipidemia    PLAN:    In order of problems listed above:  Coronary disease status post PTCA and stenting in 2013.  He is on dual antiplatelet therapy for his coronary artery disease as well as peripheral vascular disease and I will continue that as management.  Overall he seems to be doing well asymptomatic last cardiac cath in 2021 revealed no target lesion for intervention. Dyslipidemia I did review his K PN data from 01/30/2021 showed HDL 36 LDL 62 is a good cholesterol profile we will continue present management. Diabetes that being followed by internal medicine team.  His last hemoglobin A1c from October from K PN 8 is 6.1.  I did review those data with the patient.   Medication Adjustments/Labs and Tests Ordered: Current medicines are reviewed at length with the patient today.  Concerns regarding medicines are outlined above.  No orders of the defined types were placed in this encounter.  Medication changes: No orders of the defined types were placed in this encounter.   Signed, Park Liter, MD, [facility] 04/25/2021 11:19 AM    [place]

## 2021-04-25 NOTE — Patient Instructions (Signed)
Medication Instructions:  Your physician recommends that you continue on your current medications as directed. Please refer to the Current Medication list given to you today.  *If you need a refill on your cardiac medications before your next appointment, please call your pharmacy*   Lab Work: None If you have labs (blood work) drawn today and your tests are completely normal, you will receive your results only by: [place] (if you have MyChart) OR A paper copy in the mail If you have any lab test that is abnormal or we need to change your treatment, we will call you to review the results.   Follow-Up: At [facility], you and your health needs are our priority.  As part of our continuing mission to provide you with exceptional heart care, we have created designated Provider Care Teams.  These Care Teams include your primary Cardiologist (physician) and Advanced Practice Providers (APPs -  Physician Assistants and Nurse Practitioners) who all work together to provide you with the care you need, when you need it.  We recommend signing up for the patient portal called "MyChart".  Sign up information is provided on this After Visit Summary.  MyChart is used to connect with patients for Virtual Visits (Telemedicine).  Patients are able to view lab/test results, encounter notes, upcoming appointments, etc.  Non-urgent messages can be sent to your provider as well.   To learn more about what you can do with MyChart, go to [email or web address].    Your next appointment:   6 month(s)  The format for your next appointment:   In Person  Provider:   Jenne Campus, MD

## 2021-11-22 ENCOUNTER — Ambulatory Visit: Payer: Medicare Other | Admitting: Cardiology

## 2021-11-22 ENCOUNTER — Encounter: Payer: Self-pay | Admitting: Cardiology

## 2021-11-22 VITALS — BP 130/80 | HR 68 | Ht 69.0 in | Wt 239.0 lb

## 2021-11-22 DIAGNOSIS — I471 Supraventricular tachycardia: Secondary | ICD-10-CM

## 2021-11-22 DIAGNOSIS — E782 Mixed hyperlipidemia: Secondary | ICD-10-CM

## 2021-11-22 DIAGNOSIS — I10 Essential (primary) hypertension: Secondary | ICD-10-CM

## 2021-11-22 DIAGNOSIS — E1100 Type 2 diabetes mellitus with hyperosmolarity without nonketotic hyperglycemic-hyperosmolar coma (NKHHC): Secondary | ICD-10-CM | POA: Diagnosis not present

## 2021-11-22 DIAGNOSIS — I2510 Atherosclerotic heart disease of native coronary artery without angina pectoris: Secondary | ICD-10-CM | POA: Diagnosis not present

## 2021-11-22 DIAGNOSIS — M961 Postlaminectomy syndrome, not elsewhere classified: Secondary | ICD-10-CM

## 2021-11-22 DIAGNOSIS — R0609 Other forms of dyspnea: Secondary | ICD-10-CM

## 2021-11-22 NOTE — Patient Instructions (Signed)
Medication Instructions:  Your physician recommends that you continue on your current medications as directed. Please refer to the Current Medication list given to you today.  *If you need a refill on your cardiac medications before your next appointment, please call your pharmacy*   Lab Work: Lipid, AST, ALT- Today If you have labs (blood work) drawn today and your tests are completely normal, you will receive your results only by: [place] (if you have MyChart) OR A paper copy in the mail If you have any lab test that is abnormal or we need to change your treatment, we will call you to review the results.   Testing/Procedures: Your physician has requested that you have an echocardiogram. Echocardiography is a painless test that uses sound waves to create images of your heart. It provides your doctor with information about the size and shape of your heart and how well your heart's chambers and valves are working. This procedure takes approximately one hour. There are no restrictions for this procedure.    Follow-Up: At [facility], you and your health needs are our priority.  As part of our continuing mission to provide you with exceptional heart care, we have created designated Provider Care Teams.  These Care Teams include your primary Cardiologist (physician) and Advanced Practice Providers (APPs -  Physician Assistants and Nurse Practitioners) who all work together to provide you with the care you need, when you need it.  We recommend signing up for the patient portal called "MyChart".  Sign up information is provided on this After Visit Summary.  MyChart is used to connect with patients for Virtual Visits (Telemedicine).  Patients are able to view lab/test results, encounter notes, upcoming appointments, etc.  Non-urgent messages can be sent to your provider as well.   To learn more about what you can do with MyChart, go to [email or web address].    Your next appointment:    6 month(s)  The format for your next appointment:   In Person  Provider:   Jenne Campus, MD    Other Instructions NA

## 2021-11-22 NOTE — Progress Notes (Unsigned)
Cardiology Office Note:    Date:  11/22/2021   ID:  [name], DOB 1945-12-17, MRN [id number]  PCP:  Cyndi Bender, PA-C  Cardiologist:  Jenne Campus, MD    Referring MD: Cyndi Bender, PA-C   Chief Complaint  Patient presents with   Follow-up    History of Present Illness:    [name] is a 76 y.o. male  with past medical history significant for coronary artery disease.  In 2013 he did have PTCA of unknown artery.  Last cardiac catheterization he had done was in September 30, 2019 but he did not require any intervention he was find to have complete occlusion of RCA 40% LAD with multiple small vessel disease which had not worsened since 2013.  He was followed by my colleague Dr. Harriet Masson.  Recent focus of investigation was arterial study of lower extremities sinc he had some screening done which showed potentially at least moderate disease in his lower extremities, after that arterial duplex evaluation did not show critical lesions in his lower extremities.  I think his symptomatology most likely is related to his chronic back problem with 5 surgeries that he got on his back.  He does have quite a decent dorsalis pedis and posterior tibial artery.   He comes today to my office for follow-up.  Overall he seems to be doing well again the biggest problem and the biggest focus of discussion is his chronic back pain which a lot problem being created by that.  He walks around with a walker.  Denies have any cardiac complaint, there is no chest pain tightness squeezing pressure burning chest no palpitations no dizziness.  Past Medical History:  Diagnosis Date   Anxiety    Arthritis    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[place]) 03/14/2013   Atypical chest pain 12/28/2013   Chest pain 07/30/2011   1999 Cath - <50% stenosis 2008 NUC - low risk    Chronic back pain    herniated disc   Coronary artery disease    takes Plavix and ASA daily   Depression     takes Prozac daily   Diabetes mellitus type 2, controlled, with complications ([place]) 0/93/2355   Diverticulitis    hx of   Dizziness    occasionally and states Dr.Smith is aware   DJD (degenerative joint disease)    Essential hypertension, benign 07/30/2011   GERD (gastroesophageal reflux disease)    takes an OTC antacid   Gout    yrs ago and doesn't take any meds   Hard of hearing    wears hearing aids   Headache(784.0)    occasionally   History of colon polyps    benign   History of kidney stones    HNP (herniated nucleus pulposus), lumbar 06/14/2014   Hyperlipidemia    takes Atorvastatin daily   Hypertension    takes Amlodipine,Losartan,and Coreg daily   Hypothyroidism    takes Synthroid daily   IBS (irritable bowel syndrome)    Obesity, unspecified 07/30/2011   Shortness of breath dyspnea    with exertion   Tuberculosis    Patient denies Tb   Type II diabetes mellitus ([place])    takes Metformin daily   Unstable angina ([place]) 12/27/2013   Weakness    numbness and tingling in left leg    Past Surgical History:  Procedure Laterality Date   ANTERIOR CERVICAL DECOMP/DISCECTOMY FUSION  04/2007   C4-5; C6-7   BACK  SURGERY     "3 times; last time was screws in lower back 10/2008"   CHOLECYSTECTOMY  1970's   COLONOSCOPY     CORONARY ANGIOPLASTY  1999/2013   1 stent   CYSTOSCOPY     LEFT HEART CATH AND CORONARY ANGIOGRAPHY N/A 09/30/2019   Procedure: LEFT HEART CATH AND CORONARY ANGIOGRAPHY;  Surgeon: Jettie Booze, MD;  Location: [place] [facility];  Service: Cardiovascular;  Laterality: N/A;   LEFT HEART CATHETERIZATION WITH CORONARY ANGIOGRAM N/A 08/21/2011   Procedure: LEFT HEART CATHETERIZATION WITH CORONARY ANGIOGRAM;  Surgeon: Sinclair Grooms, MD;  Location: [facility] CATH LAB;  Service: Cardiovascular;  Laterality: N/A;   LITHOTRIPSY     LUMBAR LAMINECTOMY/DECOMPRESSION MICRODISCECTOMY Left 06/14/2014   Procedure: LEFT LUMBAR THREE-FOUR LANINOTOMY AND MICRODISKECTOMY.;   Surgeon: Hosie Spangle, MD;  Location: [place] NEURO ORS;  Service: Neurosurgery;  Laterality: Left;  left   PCI  5/09/2-13   LAD   RADIOLOGY WITH ANESTHESIA N/A 11/13/2015   Procedure: MRI OF LUMBAR SPINE W/WO CONTRAST    (RADIOLOGY WITH ANESTHESIA);  Surgeon: Medication Radiologist, MD;  Location: [place];  Service: Radiology;  Laterality: N/A;   SHOULDER ARTHROSCOPY W/ ROTATOR CUFF REPAIR  ~ 2011   left    Current Medications: Current Meds  Medication Sig   acetaminophen (TYLENOL) 500 MG tablet Take 1,000 mg by mouth every 6 (six) hours as needed for mild pain.    amLODipine (NORVASC) 10 MG tablet Take 10 mg by mouth daily.   ammonium lactate (LAC-HYDRIN) 12 % lotion Apply 1 application topically as needed for dry skin.   aspirin 81 MG tablet Take 81 mg by mouth daily.   atorvastatin (LIPITOR) 80 MG tablet Take 80 mg by mouth daily.   carvedilol (COREG) 25 MG tablet Take 12.5 mg by mouth 2 (two) times daily with a meal.   clopidogrel (PLAVIX) 75 MG tablet Take 75 mg by mouth daily with breakfast.   hydrochlorothiazide (HYDRODIURIL) 25 MG tablet Take 25 mg by mouth daily.    ibuprofen (ADVIL) 200 MG tablet Take 200 mg by mouth as needed for mild pain.   levothyroxine (SYNTHROID, LEVOTHROID) 50 MCG tablet Take 50 mcg by mouth daily before breakfast.   losartan (COZAAR) 100 MG tablet Take 100 mg by mouth daily.   metFORMIN (GLUCOPHAGE-XR) 500 MG 24 hr tablet Take 1 tablet (500 mg total) by mouth 2 (two) times daily.   nitroGLYCERIN (NITROSTAT) 0.4 MG SL tablet Place 0.4 mg under the tongue every 5 (five) minutes as needed. For chest pain   NUCYNTA 50 MG tablet Take 100 mg by mouth every 4 (four) hours as needed for pain.   polyethylene glycol powder (GLYCOLAX/MIRALAX) 17 GM/SCOOP powder Take 17 g by mouth in the morning.   sertraline (ZOLOFT) 100 MG tablet Take 150 mg by mouth 2 (two) times daily.   [DISCONTINUED] atorvastatin (LIPITOR) 20 MG tablet Take 20 mg by mouth daily.   [DISCONTINUED]  HYDROmorphone (DILAUDID) 2 MG tablet Take 2 mg by mouth every 4 (four) hours as needed for moderate pain or severe pain.    [DISCONTINUED] sertraline (ZOLOFT) 100 MG tablet Take 100 mg by mouth daily.     Allergies:   Sulfa antibiotics   Social History   Socioeconomic History   Marital status: Single    Spouse name: Not on file   Number of children: Not on file   Years of education: Not on file   Highest education level: Not on file  Occupational History   Not on file  Tobacco Use   Smoking status: Former   Smokeless tobacco: Current    Types: Chew  Substance and Sexual Activity   Alcohol use: No   Drug use: No   Sexual activity: Never  Other Topics Concern   Not on file  Social History Narrative   Not on file   Social Determinants of Health   Financial Resource Strain: Not on file  Food Insecurity: Not on file  Transportation Needs: Not on file  Physical Activity: Not on file  Stress: Not on file  Social Connections: Not on file     Family History: The patient's family history includes Arthritis in his mother; Cancer in his father; Healthy in his sister and sister; Heart attack in his brother; Heart disease in his brother; Hypertension in his brother. ROS:   Please see the history of present illness.    All 14 point review of systems negative except as described per history of present illness  EKGs/Labs/Other Studies Reviewed:      Recent Labs: No results found for requested labs within last 365 days.  Recent Lipid Panel    Component Value Date/Time   CHOL 175 07/31/2011 0352   TRIG 161 (H) 07/31/2011 0352   HDL 38 (L) 07/31/2011 0352   CHOLHDL 4.6 07/31/2011 0352   VLDL 32 07/31/2011 0352   LDLCALC 105 (H) 07/31/2011 0352    Physical Exam:    VS:  BP 130/80 (BP Location: Left Arm, Patient Position: Sitting, Cuff Size: Normal)   Pulse 68   Ht '5\' 9"'$  (1.753 m)   Wt 239 lb (108.4 kg)   SpO2 95%   BMI 35.29 kg/m     Wt Readings from Last 3  Encounters:  11/22/21 239 lb (108.4 kg)  04/25/21 228 lb 3.2 oz (103.5 kg)  10/12/20 221 lb (100.2 kg)     GEN:  Well nourished, well developed in no acute distress HEENT: Normal NECK: No JVD; No carotid bruits LYMPHATICS: No lymphadenopathy CARDIAC: RRR, no murmurs, no rubs, no gallops RESPIRATORY:  Clear to auscultation without rales, wheezing or rhonchi  ABDOMEN: Soft, non-tender, non-distended MUSCULOSKELETAL:  No edema; No deformity  SKIN: Warm and dry LOWER EXTREMITIES: no swelling NEUROLOGIC:  Alert and oriented x 3 PSYCHIATRIC:  Normal affect   ASSESSMENT:    1. Coronary artery disease involving native coronary artery of native heart without angina pectoris   2. PAT (paroxysmal atrial tachycardia) ([place])   3. Primary hypertension   4. Type 2 diabetes mellitus with hyperosmolarity without coma, without long-term current use of insulin ([place])   5. Lumbar post-laminectomy syndrome    PLAN:    In order of problems listed above:  Coronary disease seems to be stable from that point review on appropriate medications which include antiplatelets therapy and high intensity statin.  Denies have any symptoms that would suggest reactivation of the problem. History of paroxysmal atrial tachycardia.  Denies have any palpitations No hypertension blood pressure reasonably controlled we will continue present management Dyslipidemia I did review his K PN which show me data from 08/05/2021 with LDL of 100 HDL 43 he was on 20 mg of Lipitor that medication has been increased to 80 I will check his fasting lipid profile today. Lumbar issues with chronic pain and back pain obviously still ongoing. Peripheral vascular disease in form of lower extremities atherosclerosis not critical continue dual antiplatelet therapy and statin.   Medication Adjustments/Labs and Tests Ordered: Current medicines  are reviewed at length with the patient today.  Concerns regarding medicines are outlined above.  No  orders of the defined types were placed in this encounter.  Medication changes: No orders of the defined types were placed in this encounter.   Signed, Park Liter, MD, [facility] 11/22/2021 8:42 AM    [place]

## 2021-11-23 LAB — LIPID PANEL
Chol/HDL Ratio: 3.5 ratio (ref 0.0–5.0)
Cholesterol, Total: 120 mg/dL (ref 100–199)
HDL: 34 mg/dL — ABNORMAL LOW (ref >39)
LDL Chol Calc (NIH): 57 mg/dL (ref 0–99)
Triglycerides: 170 mg/dL — ABNORMAL HIGH (ref 0–149)
VLDL Cholesterol Cal: 29 mg/dL (ref 5–40)

## 2021-11-23 LAB — AST: AST: 21 IU/L (ref 0–40)

## 2021-11-23 LAB — ALT: ALT: 31 IU/L (ref 0–44)

## 2021-11-28 ENCOUNTER — Telehealth: Payer: Self-pay

## 2021-11-28 NOTE — Telephone Encounter (Signed)
LVM per DPR per Dr. Wendy Poet note. Encouraged to call with any questions or concerns.

## 2021-12-03 ENCOUNTER — Other Ambulatory Visit: Payer: Medicare Other

## 2021-12-04 ENCOUNTER — Ambulatory Visit (INDEPENDENT_AMBULATORY_CARE_PROVIDER_SITE_OTHER): Payer: Medicare Other

## 2021-12-04 DIAGNOSIS — I2510 Atherosclerotic heart disease of native coronary artery without angina pectoris: Secondary | ICD-10-CM

## 2021-12-04 DIAGNOSIS — R0609 Other forms of dyspnea: Secondary | ICD-10-CM | POA: Diagnosis not present

## 2021-12-04 LAB — ECHOCARDIOGRAM COMPLETE
Area-P 1/2: 3.7 cm2
S' Lateral: 3.4 cm

## 2021-12-06 ENCOUNTER — Telehealth: Payer: Self-pay | Admitting: Cardiology

## 2021-12-06 NOTE — Telephone Encounter (Signed)
Pt is returning call in regards to echo. Requesting call back.

## 2021-12-06 NOTE — Telephone Encounter (Signed)
Results reviewed with pt as per Dr. Krasowski's note.  Pt verbalized understanding and had no additional questions. Routed to PCP  

## 2022-06-04 ENCOUNTER — Emergency Department (HOSPITAL_COMMUNITY): Payer: Medicare Other

## 2022-06-04 ENCOUNTER — Other Ambulatory Visit: Payer: Self-pay

## 2022-06-04 ENCOUNTER — Encounter (HOSPITAL_COMMUNITY): Payer: Self-pay

## 2022-06-04 ENCOUNTER — Inpatient Hospital Stay (HOSPITAL_COMMUNITY)
Admission: EM | Admit: 2022-06-04 | Discharge: 2022-06-10 | DRG: 552 | Disposition: A | Discharge status: Skilled Nursing Facility | Payer: Medicare Other | Attending: Internal Medicine | Admitting: Internal Medicine

## 2022-06-04 ENCOUNTER — Inpatient Hospital Stay (HOSPITAL_COMMUNITY): Payer: Medicare Other

## 2022-06-04 DIAGNOSIS — M549 Dorsalgia, unspecified: Secondary | ICD-10-CM | POA: Diagnosis not present

## 2022-06-04 DIAGNOSIS — Z043 Encounter for examination and observation following other accident: Secondary | ICD-10-CM | POA: Diagnosis not present

## 2022-06-04 DIAGNOSIS — Z7984 Long term (current) use of oral hypoglycemic drugs: Secondary | ICD-10-CM

## 2022-06-04 DIAGNOSIS — R29898 Other symptoms and signs involving the musculoskeletal system: Secondary | ICD-10-CM | POA: Diagnosis not present

## 2022-06-04 DIAGNOSIS — M6289 Other specified disorders of muscle: Secondary | ICD-10-CM | POA: Diagnosis not present

## 2022-06-04 DIAGNOSIS — M5412 Radiculopathy, cervical region: Secondary | ICD-10-CM | POA: Diagnosis not present

## 2022-06-04 DIAGNOSIS — E1165 Type 2 diabetes mellitus with hyperglycemia: Secondary | ICD-10-CM | POA: Diagnosis not present

## 2022-06-04 DIAGNOSIS — R296 Repeated falls: Secondary | ICD-10-CM | POA: Diagnosis present

## 2022-06-04 DIAGNOSIS — M109 Gout, unspecified: Secondary | ICD-10-CM | POA: Diagnosis present

## 2022-06-04 DIAGNOSIS — M75121 Complete rotator cuff tear or rupture of right shoulder, not specified as traumatic: Secondary | ICD-10-CM | POA: Diagnosis not present

## 2022-06-04 DIAGNOSIS — I10 Essential (primary) hypertension: Secondary | ICD-10-CM | POA: Diagnosis present

## 2022-06-04 DIAGNOSIS — Z79899 Other long term (current) drug therapy: Secondary | ICD-10-CM

## 2022-06-04 DIAGNOSIS — E039 Hypothyroidism, unspecified: Secondary | ICD-10-CM | POA: Diagnosis not present

## 2022-06-04 DIAGNOSIS — E872 Acidosis, unspecified: Secondary | ICD-10-CM | POA: Diagnosis present

## 2022-06-04 DIAGNOSIS — M50122 Cervical disc disorder at C5-C6 level with radiculopathy: Secondary | ICD-10-CM | POA: Diagnosis not present

## 2022-06-04 DIAGNOSIS — T380X5A Adverse effect of glucocorticoids and synthetic analogues, initial encounter: Secondary | ICD-10-CM | POA: Diagnosis not present

## 2022-06-04 DIAGNOSIS — D649 Anemia, unspecified: Secondary | ICD-10-CM | POA: Diagnosis not present

## 2022-06-04 DIAGNOSIS — G8929 Other chronic pain: Secondary | ICD-10-CM | POA: Diagnosis present

## 2022-06-04 DIAGNOSIS — R292 Abnormal reflex: Secondary | ICD-10-CM | POA: Diagnosis present

## 2022-06-04 DIAGNOSIS — M6282 Rhabdomyolysis: Secondary | ICD-10-CM | POA: Diagnosis present

## 2022-06-04 DIAGNOSIS — W06XXXA Fall from bed, initial encounter: Secondary | ICD-10-CM | POA: Diagnosis present

## 2022-06-04 DIAGNOSIS — M6281 Muscle weakness (generalized): Secondary | ICD-10-CM | POA: Diagnosis not present

## 2022-06-04 DIAGNOSIS — I639 Cerebral infarction, unspecified: Secondary | ICD-10-CM | POA: Diagnosis not present

## 2022-06-04 DIAGNOSIS — R6889 Other general symptoms and signs: Secondary | ICD-10-CM | POA: Diagnosis not present

## 2022-06-04 DIAGNOSIS — I480 Paroxysmal atrial fibrillation: Secondary | ICD-10-CM | POA: Diagnosis not present

## 2022-06-04 DIAGNOSIS — E785 Hyperlipidemia, unspecified: Secondary | ICD-10-CM | POA: Diagnosis not present

## 2022-06-04 DIAGNOSIS — S0512XA Contusion of eyeball and orbital tissues, left eye, initial encounter: Secondary | ICD-10-CM | POA: Diagnosis not present

## 2022-06-04 DIAGNOSIS — Z882 Allergy status to sulfonamides status: Secondary | ICD-10-CM

## 2022-06-04 DIAGNOSIS — Y92032 Bedroom in apartment as the place of occurrence of the external cause: Secondary | ICD-10-CM | POA: Diagnosis not present

## 2022-06-04 DIAGNOSIS — Z7902 Long term (current) use of antithrombotics/antiplatelets: Secondary | ICD-10-CM

## 2022-06-04 DIAGNOSIS — Z1152 Encounter for screening for COVID-19: Secondary | ICD-10-CM

## 2022-06-04 DIAGNOSIS — S0083XA Contusion of other part of head, initial encounter: Secondary | ICD-10-CM | POA: Diagnosis not present

## 2022-06-04 DIAGNOSIS — R7401 Elevation of levels of liver transaminase levels: Secondary | ICD-10-CM | POA: Diagnosis not present

## 2022-06-04 DIAGNOSIS — Z741 Need for assistance with personal care: Secondary | ICD-10-CM | POA: Diagnosis not present

## 2022-06-04 DIAGNOSIS — R823 Hemoglobinuria: Secondary | ICD-10-CM | POA: Diagnosis present

## 2022-06-04 DIAGNOSIS — Z7409 Other reduced mobility: Secondary | ICD-10-CM | POA: Diagnosis not present

## 2022-06-04 DIAGNOSIS — Z7401 Bed confinement status: Secondary | ICD-10-CM | POA: Diagnosis not present

## 2022-06-04 DIAGNOSIS — R001 Bradycardia, unspecified: Secondary | ICD-10-CM | POA: Diagnosis not present

## 2022-06-04 DIAGNOSIS — Z9049 Acquired absence of other specified parts of digestive tract: Secondary | ICD-10-CM

## 2022-06-04 DIAGNOSIS — Z6837 Body mass index (BMI) 37.0-37.9, adult: Secondary | ICD-10-CM | POA: Diagnosis not present

## 2022-06-04 DIAGNOSIS — N3000 Acute cystitis without hematuria: Secondary | ICD-10-CM

## 2022-06-04 DIAGNOSIS — H903 Sensorineural hearing loss, bilateral: Secondary | ICD-10-CM | POA: Diagnosis present

## 2022-06-04 DIAGNOSIS — E119 Type 2 diabetes mellitus without complications: Secondary | ICD-10-CM | POA: Diagnosis not present

## 2022-06-04 DIAGNOSIS — R2681 Unsteadiness on feet: Secondary | ICD-10-CM | POA: Diagnosis not present

## 2022-06-04 DIAGNOSIS — E871 Hypo-osmolality and hyponatremia: Secondary | ICD-10-CM | POA: Diagnosis not present

## 2022-06-04 DIAGNOSIS — Z8249 Family history of ischemic heart disease and other diseases of the circulatory system: Secondary | ICD-10-CM

## 2022-06-04 DIAGNOSIS — R531 Weakness: Secondary | ICD-10-CM

## 2022-06-04 DIAGNOSIS — F32A Depression, unspecified: Secondary | ICD-10-CM | POA: Diagnosis not present

## 2022-06-04 DIAGNOSIS — W19XXXA Unspecified fall, initial encounter: Secondary | ICD-10-CM

## 2022-06-04 DIAGNOSIS — I2510 Atherosclerotic heart disease of native coronary artery without angina pectoris: Secondary | ICD-10-CM | POA: Diagnosis present

## 2022-06-04 DIAGNOSIS — Z8261 Family history of arthritis: Secondary | ICD-10-CM

## 2022-06-04 DIAGNOSIS — N200 Calculus of kidney: Secondary | ICD-10-CM | POA: Diagnosis present

## 2022-06-04 DIAGNOSIS — Z87891 Personal history of nicotine dependence: Secondary | ICD-10-CM | POA: Diagnosis not present

## 2022-06-04 DIAGNOSIS — Z79890 Hormone replacement therapy (postmenopausal): Secondary | ICD-10-CM

## 2022-06-04 DIAGNOSIS — K219 Gastro-esophageal reflux disease without esophagitis: Secondary | ICD-10-CM | POA: Diagnosis present

## 2022-06-04 DIAGNOSIS — D696 Thrombocytopenia, unspecified: Secondary | ICD-10-CM | POA: Diagnosis present

## 2022-06-04 DIAGNOSIS — F419 Anxiety disorder, unspecified: Secondary | ICD-10-CM | POA: Diagnosis present

## 2022-06-04 DIAGNOSIS — D72829 Elevated white blood cell count, unspecified: Secondary | ICD-10-CM | POA: Diagnosis present

## 2022-06-04 DIAGNOSIS — Z7982 Long term (current) use of aspirin: Secondary | ICD-10-CM

## 2022-06-04 DIAGNOSIS — S46219A Strain of muscle, fascia and tendon of other parts of biceps, unspecified arm, initial encounter: Secondary | ICD-10-CM | POA: Diagnosis present

## 2022-06-04 DIAGNOSIS — R8281 Pyuria: Secondary | ICD-10-CM | POA: Diagnosis present

## 2022-06-04 DIAGNOSIS — M19011 Primary osteoarthritis, right shoulder: Secondary | ICD-10-CM | POA: Diagnosis not present

## 2022-06-04 DIAGNOSIS — Z8719 Personal history of other diseases of the digestive system: Secondary | ICD-10-CM

## 2022-06-04 DIAGNOSIS — M961 Postlaminectomy syndrome, not elsewhere classified: Secondary | ICD-10-CM | POA: Diagnosis not present

## 2022-06-04 DIAGNOSIS — M542 Cervicalgia: Secondary | ICD-10-CM | POA: Diagnosis not present

## 2022-06-04 DIAGNOSIS — R2990 Unspecified symptoms and signs involving the nervous system: Secondary | ICD-10-CM | POA: Diagnosis not present

## 2022-06-04 DIAGNOSIS — I4719 Other supraventricular tachycardia: Secondary | ICD-10-CM | POA: Diagnosis not present

## 2022-06-04 DIAGNOSIS — Z87442 Personal history of urinary calculi: Secondary | ICD-10-CM

## 2022-06-04 DIAGNOSIS — R600 Localized edema: Secondary | ICD-10-CM | POA: Diagnosis not present

## 2022-06-04 DIAGNOSIS — Z981 Arthrodesis status: Secondary | ICD-10-CM | POA: Diagnosis not present

## 2022-06-04 DIAGNOSIS — R488 Other symbolic dysfunctions: Secondary | ICD-10-CM | POA: Diagnosis not present

## 2022-06-04 DIAGNOSIS — Z743 Need for continuous supervision: Secondary | ICD-10-CM | POA: Diagnosis not present

## 2022-06-04 DIAGNOSIS — Z955 Presence of coronary angioplasty implant and graft: Secondary | ICD-10-CM

## 2022-06-04 DIAGNOSIS — Z800 Family history of malignant neoplasm of digestive organs: Secondary | ICD-10-CM

## 2022-06-04 HISTORY — DX: Weakness: R53.1

## 2022-06-04 LAB — DIFFERENTIAL
Abs Immature Granulocytes: 0.09 10*3/uL — ABNORMAL HIGH (ref 0.00–0.07)
Basophils Absolute: 0 10*3/uL (ref 0.0–0.1)
Basophils Relative: 0 %
Eosinophils Absolute: 0 10*3/uL (ref 0.0–0.5)
Eosinophils Relative: 0 %
Immature Granulocytes: 1 %
Lymphocytes Relative: 4 %
Lymphs Abs: 0.7 10*3/uL (ref 0.7–4.0)
Monocytes Absolute: 1.1 10*3/uL — ABNORMAL HIGH (ref 0.1–1.0)
Monocytes Relative: 7 %
Neutro Abs: 14.6 10*3/uL — ABNORMAL HIGH (ref 1.7–7.7)
Neutrophils Relative %: 88 %

## 2022-06-04 LAB — I-STAT CHEM 8, ED
BUN: 29 mg/dL — ABNORMAL HIGH (ref 8–23)
Calcium, Ion: 1.09 mmol/L — ABNORMAL LOW (ref 1.15–1.40)
Chloride: 104 mmol/L (ref 98–111)
Creatinine, Ser: 0.8 mg/dL (ref 0.61–1.24)
Glucose, Bld: 177 mg/dL — ABNORMAL HIGH (ref 70–99)
HCT: 35 % — ABNORMAL LOW (ref 39.0–52.0)
Hemoglobin: 11.9 g/dL — ABNORMAL LOW (ref 13.0–17.0)
Potassium: 3.8 mmol/L (ref 3.5–5.1)
Sodium: 136 mmol/L (ref 135–145)
TCO2: 21 mmol/L — ABNORMAL LOW (ref 22–32)

## 2022-06-04 LAB — COMPREHENSIVE METABOLIC PANEL
ALT: 35 U/L (ref 0–44)
AST: 92 U/L — ABNORMAL HIGH (ref 15–41)
Albumin: 3.3 g/dL — ABNORMAL LOW (ref 3.5–5.0)
Alkaline Phosphatase: 45 U/L (ref 38–126)
Anion gap: 13 (ref 5–15)
BUN: 25 mg/dL — ABNORMAL HIGH (ref 8–23)
CO2: 20 mmol/L — ABNORMAL LOW (ref 22–32)
Calcium: 8.9 mg/dL (ref 8.9–10.3)
Chloride: 100 mmol/L (ref 98–111)
Creatinine, Ser: 0.94 mg/dL (ref 0.61–1.24)
GFR, Estimated: >60 mL/min (ref >60)
Glucose, Bld: 176 mg/dL — ABNORMAL HIGH (ref 70–99)
Potassium: 3.9 mmol/L (ref 3.5–5.1)
Sodium: 133 mmol/L — ABNORMAL LOW (ref 135–145)
Total Bilirubin: 1.5 mg/dL — ABNORMAL HIGH (ref 0.3–1.2)
Total Protein: 6.3 g/dL — ABNORMAL LOW (ref 6.5–8.1)

## 2022-06-04 LAB — RESP PANEL BY RT-PCR (RSV, FLU A&B, COVID)  RVPGX2
Influenza A by PCR: NEGATIVE
Influenza B by PCR: NEGATIVE
Resp Syncytial Virus by PCR: NEGATIVE
SARS Coronavirus 2 by RT PCR: NEGATIVE

## 2022-06-04 LAB — CK: Total CK: 4751 U/L — ABNORMAL HIGH (ref 49–397)

## 2022-06-04 LAB — URINALYSIS, ROUTINE W REFLEX MICROSCOPIC
Bilirubin Urine: NEGATIVE
Glucose, UA: NEGATIVE mg/dL
Ketones, ur: 5 mg/dL — AB
Nitrite: NEGATIVE
Protein, ur: 30 mg/dL — AB
Specific Gravity, Urine: 1.016 (ref 1.005–1.030)
WBC, UA: >50 WBC/hpf (ref 0–5)
pH: 5 (ref 5.0–8.0)

## 2022-06-04 LAB — CBC
HCT: 36 % — ABNORMAL LOW (ref 39.0–52.0)
Hemoglobin: 12.5 g/dL — ABNORMAL LOW (ref 13.0–17.0)
MCH: 31.4 pg (ref 26.0–34.0)
MCHC: 34.7 g/dL (ref 30.0–36.0)
MCV: 90.5 fL (ref 80.0–100.0)
Platelets: 145 10*3/uL — ABNORMAL LOW (ref 150–400)
RBC: 3.98 MIL/uL — ABNORMAL LOW (ref 4.22–5.81)
RDW: 13.1 % (ref 11.5–15.5)
WBC: 16.6 10*3/uL — ABNORMAL HIGH (ref 4.0–10.5)
nRBC: 0 % (ref 0.0–0.2)

## 2022-06-04 LAB — RAPID URINE DRUG SCREEN, HOSP PERFORMED
Amphetamines: NOT DETECTED
Barbiturates: NOT DETECTED
Benzodiazepines: NOT DETECTED
Cocaine: NOT DETECTED
Opiates: NOT DETECTED
Tetrahydrocannabinol: NOT DETECTED

## 2022-06-04 LAB — CBG MONITORING, ED: Glucose-Capillary: 163 mg/dL — ABNORMAL HIGH (ref 70–99)

## 2022-06-04 LAB — PROTIME-INR
INR: 1.3 — ABNORMAL HIGH (ref 0.8–1.2)
Prothrombin Time: 15.9 seconds — ABNORMAL HIGH (ref 11.4–15.2)

## 2022-06-04 LAB — APTT: aPTT: 29 seconds (ref 24–36)

## 2022-06-04 LAB — ETHANOL: Alcohol, Ethyl (B): <10 mg/dL (ref <10)

## 2022-06-04 MED ORDER — LEVOTHYROXINE SODIUM 50 MCG PO TABS
50.0000 ug | ORAL_TABLET | Freq: Every day | ORAL | Status: DC
  Administered 2022-06-05 – 2022-06-10 (×5): 50 ug via ORAL
  Filled 2022-06-04 (×5): qty 1

## 2022-06-04 MED ORDER — HYDROMORPHONE HCL 2 MG PO TABS
2.0000 mg | ORAL_TABLET | Freq: Once | ORAL | Status: AC
  Administered 2022-06-04: 2 mg via ORAL
  Filled 2022-06-04: qty 1

## 2022-06-04 MED ORDER — ENOXAPARIN SODIUM 60 MG/0.6ML IJ SOSY
60.0000 mg | PREFILLED_SYRINGE | INTRAMUSCULAR | Status: DC
  Administered 2022-06-04 – 2022-06-10 (×7): 60 mg via SUBCUTANEOUS
  Filled 2022-06-04 (×7): qty 0.6

## 2022-06-04 MED ORDER — LIDOCAINE 5 % EX PTCH
1.0000 | MEDICATED_PATCH | CUTANEOUS | Status: DC
  Administered 2022-06-04 – 2022-06-10 (×7): 1 via TRANSDERMAL
  Filled 2022-06-04 (×7): qty 1

## 2022-06-04 MED ORDER — ACETAMINOPHEN 325 MG PO TABS
650.0000 mg | ORAL_TABLET | Freq: Four times a day (QID) | ORAL | Status: DC | PRN
  Administered 2022-06-04 – 2022-06-05 (×2): 650 mg via ORAL
  Filled 2022-06-04 (×2): qty 2

## 2022-06-04 MED ORDER — ENOXAPARIN SODIUM 40 MG/0.4ML IJ SOSY: 40.0000 mg | PREFILLED_SYRINGE | INTRAMUSCULAR | Status: DC

## 2022-06-04 MED ORDER — SERTRALINE HCL 100 MG PO TABS
100.0000 mg | ORAL_TABLET | Freq: Every day | ORAL | Status: DC
  Administered 2022-06-04 – 2022-06-09 (×6): 100 mg via ORAL
  Filled 2022-06-04 (×6): qty 1

## 2022-06-04 MED ORDER — ASPIRIN 81 MG PO TBEC
81.0000 mg | DELAYED_RELEASE_TABLET | Freq: Every day | ORAL | Status: DC
  Administered 2022-06-04 – 2022-06-10 (×7): 81 mg via ORAL
  Filled 2022-06-04 (×7): qty 1

## 2022-06-04 MED ORDER — SODIUM CHLORIDE 0.9 % IV SOLN
1.0000 g | INTRAVENOUS | Status: AC
  Administered 2022-06-04: 1 g via INTRAVENOUS
  Filled 2022-06-04: qty 10

## 2022-06-04 MED ORDER — DICLOFENAC SODIUM 1 % EX GEL
4.0000 g | Freq: Four times a day (QID) | CUTANEOUS | Status: DC
  Administered 2022-06-04 – 2022-06-10 (×21): 4 g via TOPICAL
  Filled 2022-06-04 (×2): qty 100

## 2022-06-04 MED ORDER — CLOPIDOGREL BISULFATE 75 MG PO TABS
75.0000 mg | ORAL_TABLET | Freq: Every day | ORAL | Status: DC
  Administered 2022-06-05 – 2022-06-10 (×5): 75 mg via ORAL
  Filled 2022-06-04 (×5): qty 1

## 2022-06-04 MED ORDER — ATORVASTATIN CALCIUM 80 MG PO TABS
80.0000 mg | ORAL_TABLET | Freq: Every day | ORAL | Status: DC
  Administered 2022-06-04 – 2022-06-10 (×7): 80 mg via ORAL
  Filled 2022-06-04 (×7): qty 1

## 2022-06-04 MED ORDER — POLYETHYLENE GLYCOL 3350 17 G PO PACK
17.0000 g | PACK | Freq: Every morning | ORAL | Status: DC
  Administered 2022-06-05 – 2022-06-10 (×5): 17 g via ORAL
  Filled 2022-06-04 (×5): qty 1

## 2022-06-04 MED ORDER — CARVEDILOL 12.5 MG PO TABS
12.5000 mg | ORAL_TABLET | Freq: Two times a day (BID) | ORAL | Status: DC
  Administered 2022-06-04 – 2022-06-09 (×8): 12.5 mg via ORAL
  Filled 2022-06-04 (×8): qty 1

## 2022-06-04 MED ORDER — LACTATED RINGERS IV SOLN: INTRAVENOUS | Status: AC

## 2022-06-04 MED ORDER — TAPENTADOL HCL ER 50 MG PO TB12
50.0000 mg | ORAL_TABLET | Freq: Two times a day (BID) | ORAL | Status: DC
  Filled 2022-06-04: qty 1

## 2022-06-04 NOTE — ED Notes (Addendum)
Secure chat admitting providers, requesting pain meds for patient.

## 2022-06-04 NOTE — Progress Notes (Signed)
EEG complete - results pending 

## 2022-06-04 NOTE — Consult Note (Addendum)
Neurology Consultation Reason for Consult: Right upper extremity weakness and numbness code stroke Requesting Physician: Margaretmary Eddy  CC: Fall and right upper extremity weakness  History is obtained from: Patient and chart review  HPI: [name] is a 77 y.o. male with a past medical history significant for hypertension, hyperlipidemia, type 2 diabetes, hypothyroidism, coronary artery disease s/p stent, tuberculosis, obesity (BMI 37.18), cervical spine fusion, lumbar spine fusion, chronic pain on chronic opiates, anxiety/depression, paroxysmal atrial tachycardia, NSVT, smoking,  He reports he had some slight chills and shivering yesterday but denies any other infectious symptoms other than mild nausea.  He had a slight headache when he went to bed at 7 PM and was having more than normal chronic lower back pain.  He awoke at 3 AM, thinks he had fallen out of bed and may be fallen on his right arm.  He was confused as his furniture seem to be rearranged (not disordered but not where it would normally be, he is not sure who rearranged it.  He did have to call EMS to assist him back up.  He was able to ambulate to his lift assist chair, but did note he was having some right upper extremity weakness.  This has happened previously (last approximately 1 month ago, not as severe and did not last as long, again thinks he may have laid on it too long) and he thought it might resolve.  However it was not resolving.  He was also having difficulty getting out of the chair due to low back pain.  Eventually he was able to get up out of his chair but fell again, activating EMS again.  Due to his persistent right upper extremity weakness they activated code stroke and brought him to [facility] for evaluation  He denies any bowel or bladder symptoms other than occasional difficulty getting to the bathroom in time time and due to his back pain and baseline difficulty ambulating  He notes he does not  tolerate MRI scan secondary to severe anxiety as well as pain and his last MRI of his spine he required generalized anesthesia (several years ago, November 13 2015 in our system had MRI lumbar spine under anesthesia)  LKW: 7 PM Thrombolytic given?: No, out of the window IA performed?: No, exam not consistent with LVO Premorbid modified rankin scale:      3 - Moderate disability. 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   Past Medical History:  Diagnosis Date   Anxiety    Arthritis    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[place]) 03/14/2013   Atypical chest pain 12/28/2013   Chest pain 07/30/2011   1999 Cath - <50% stenosis 2008 NUC - low risk    Chronic back pain    herniated disc   Coronary artery disease    takes Plavix and ASA daily   Depression    takes Prozac daily   Diabetes mellitus type 2, controlled, with complications ([place]) [id number]   Diverticulitis    hx of   Dizziness    occasionally and states Dr.Smith is aware   DJD (degenerative joint disease)    Essential hypertension, benign 07/30/2011   GERD (gastroesophageal reflux disease)    takes an OTC antacid  Gout    yrs ago and doesn't take any meds   Hard of hearing    wears hearing aids   Headache(784.0)    occasionally   History of colon polyps    benign   History of kidney stones    HNP (herniated nucleus pulposus), lumbar 06/14/2014   Hyperlipidemia    takes Atorvastatin daily   Hypertension    takes Amlodipine,Losartan,and Coreg daily   Hypothyroidism    takes Synthroid daily   IBS (irritable bowel syndrome)    Obesity, unspecified 07/30/2011   Shortness of breath dyspnea    with exertion   Tuberculosis    Patient denies Tb   Type II diabetes mellitus ([place])    takes  Metformin daily   Unstable angina ([place]) 12/27/2013   Weakness    numbness and tingling in left leg   Past Surgical History:  Procedure Laterality Date   ANTERIOR CERVICAL DECOMP/DISCECTOMY FUSION  04/2007   C4-5; C6-7   BACK SURGERY     "3 times; last time was screws in lower back 10/2008"   CHOLECYSTECTOMY  1970's   COLONOSCOPY     CORONARY ANGIOPLASTY  1999/2013   1 stent   CYSTOSCOPY     LEFT HEART CATH AND CORONARY ANGIOGRAPHY N/A 09/30/2019   Procedure: LEFT HEART CATH AND CORONARY ANGIOGRAPHY;  Surgeon: Jettie Booze, MD;  Location: [place] [facility];  Service: Cardiovascular;  Laterality: N/A;   LEFT HEART CATHETERIZATION WITH CORONARY ANGIOGRAM N/A 08/21/2011   Procedure: LEFT HEART CATHETERIZATION WITH CORONARY ANGIOGRAM;  Surgeon: Sinclair Grooms, MD;  Location: [facility] C[facility];  Service: Cardiovascular;  Laterality: N/A;   LITHOTRIPSY     LUMBAR LAMINECTOMY/DECOMPRESSION MICRODISCECTOMY Left 06/14/2014   Procedure: LEFT LUMBAR THREE-FOUR LANINOTOMY AND MICRODISKECTOMY.;  Surgeon: Hosie Spangle, MD;  Location: [facility];  Service: Neurosurgery;  Laterality: Left;  left   PCI  5/09/2-13   LAD   RADIOLOGY WITH ANESTHESIA N/A 11/13/2015   Procedure: MRI OF LUMBAR SPINE W/WO CONTRAST    (RADIOLOGY WITH ANESTHESIA);  Surgeon: Medication Radiologist, MD;  Location: [place];  Service: Radiology;  Laterality: N/A;   SHOULDER ARTHROSCOPY W/ ROTATOR CUFF REPAIR  ~ 2011   left   Current Outpatient Medications  Medication Instructions   acetaminophen (TYLENOL) 1,000 mg, Oral, Every 6 hours PRN   amLODipine (NORVASC) 10 mg, Daily   ammonium lactate (LAC-HYDRIN) 12 % lotion 1 application , Topical, As needed   aspirin 81 mg, Daily   atorvastatin (LIPITOR) 80 mg, Oral, Daily   carvedilol (COREG) 12.5 mg, Oral, 2 times daily with meals   clopidogrel (PLAVIX) 75 mg, Oral, Daily with breakfast   hydrochlorothiazide (HYDRODIURIL) 25 mg, Oral, Daily   ibuprofen (ADVIL) 200 mg, Oral, As  needed   levothyroxine (SYNTHROID) 50 mcg, Oral, Daily before breakfast   losartan (COZAAR) 100 mg, Daily   metFORMIN (GLUCOPHAGE-XR) 500 mg, Oral, 2 times daily   nitroGLYCERIN (NITROSTAT) 0.4 mg, Sublingual, Every 5 min PRN, For chest pain    Nucynta 100 mg, Oral, Every 4 hours PRN   polyethylene glycol powder (GLYCOLAX/MIRALAX) 17 g, Oral, Every morning   sertraline (ZOLOFT) 150 mg, Oral, 2 times daily     Family History  Problem Relation Age of Onset   Cancer Father        colon   Arthritis Mother    Healthy Sister    Heart attack Brother    Heart disease Brother    Hypertension Brother  Healthy Sister     Social History:  reports that he has quit smoking. His smokeless tobacco use includes chew. He reports that he does not drink alcohol and does not use drugs.  Exam: Current vital signs: Ht 5' 9"$  (1.753 m)   Wt 114.2 kg   BMI 37.18 kg/m  Vital signs in last 24 hours: Weight:  [114.2 kg] 114.2 kg (02/21 0700)   Physical Exam  Constitutional: Appears well-developed and well-nourished.  Psych: Affect appropriate to situation, cooperative Eyes: Left eye periorbital hematoma/edema HENT: No oropharyngeal obstruction.   MSK: no major joint deformities.  Cardiovascular: Normal rate and regular rhythm. Perfusing extremities well Respiratory: Effort normal, non-labored breathing GI: Soft.  No distension. There is no tenderness.  Skin: Warm dry and intact visible skin other than facial hematoma described above  Neuro: Mental Status: Patient is awake, alert, oriented to person, place, month, age, and situation. Patient is able to give a clear and coherent history. No signs of aphasia or neglect other than some mild difficulty repeating possibly secondary to hearing and baseline per EMS and patient Cranial Nerves: II: Visual Fields are full. Pupils are equal, round, and reactive to light.   III,IV, VI: EOMI without diploplia, some left eye ptosis secondary to edema.   V: Facial sensation is symmetric to temperature VII: Facial movement is notable for slight right facial droop.  VIII: hearing is intact to voice X: Uvula elevates symmetrically XI: Shoulder shrug is symmetric. XII: tongue is midline without atrophy or fasciculations.  Motor: Examination is very effort/pain limited, but he does appear weaker in the right upper extremity, 1-2/5, 4/5 left upper extremity, very pain limited in the bilateral lower extremities but does at least lift the bilateral lower extremities antigravity, better effort when he is spontaneously lifting the left lower extremity to relieve pain than when asked to maintain the leg antigravity, grossly stronger distally than proximally.  Sensory: He reports that glovelike loss of sensation in the right upper extremity up to the shoulder Deep Tendon Reflexes: Absent right upper extremity biceps and brachioradialis, trace triceps reflex.  On the left side he has diminished but 2+ biceps, brachioradialis and triceps.  2+ and symmetric patellar's, 2+ and symmetric Achilles, Plantars: Toes are mute bilaterally Cerebellar: Finger-nose is intact in the left upper extremity, unable to perform in the lower extremities due to back pain Gait:  Deferred for patient's safety   I have reviewed labs in epic and the results pertinent to this consultation are:  Basic Metabolic Panel: Recent Labs  Lab 06/04/22 0754 06/04/22 0800  NA 133* 136  K 3.9 3.8  CL 100 104  CO2 20*  --   GLUCOSE 176* 177*  BUN 25* 29*  CREATININE 0.94 0.80  CALCIUM 8.9  --     CBC: Recent Labs  Lab 06/04/22 0754 06/04/22 0800  WBC 16.6*  --   NEUTROABS 14.6*  --   HGB 12.5* 11.9*  HCT 36.0* 35.0*  MCV 90.5  --   PLT 145*  --     Coagulation Studies: Recent Labs    06/04/22 0754  LABPROT 15.9*  INR 1.3*      I have reviewed the images obtained:  Head CT personally reviewed, agree with radiology:   No acute intracranial pathology.   CT  C- T- and L-spine 1. No evidence of acute fracture or traumatic malalignment of the cervical, thoracic, or lumbar spine. 2. Postsurgical changes in the cervical and lumbar spine as above without evidence  of complication. 3. Multifactorial severe spinal canal stenosis at L2-L3 and L3-L4 and moderate to severe bilateral neural foraminal stenosis at L3-L4. 4. 0.8 cm nonobstructing right renal stone.   Impression: Patient presenting with acute right upper extremity weakness after recent fall with associated numbness.  Possible localizations include brain or cervical spine, with his effort on examination significantly limiting full ability to characterize.  However given the asymmetry in the reflexes (unfortunately no recent reflexes documented on prior exams to confirm chronicity), I am concerned about the possibility of multiple cervical radiculopathies/brachial plexopathy.  Given the recurrent nature of these symptoms associated with position, I feel stroke is less likely.  Furthermore if this is a stroke it would be a small vessel stroke which he is currently optimized from dual antiplatelet perspective  Recommendations: -Repeat head CT in 24 hours to rule out stroke -Consider CT myelogram versus anesthetized MRI brain and C-spine tomorrow pending clinical course, will consider repeat MRI L-spine as well due to his worsening pain -PT/OT -Continue home dual antiplatelet therapy -A1c, lipid panel from neurological perspective goal less than 7% and goal less than 70 respectively but may have stricter criteria from cardiac perspective -No need for echocardiogram at this time in the absence of new cardiac symptoms and overall low concern for stroke, most likely small vessel disease if it is a stroke -Routine EEG given confusion reported by patient -Neurology will follow  Lesleigh Noe MD-PhD [facility] [phone] Available 7 AM to 7 PM, outside these hours please contact  Neurologist on call listed on AMION

## 2022-06-04 NOTE — ED Provider Notes (Addendum)
[facility] Provider Note   CSN: [id number] Arrival date & time: 06/04/22  [id number]  An emergency department physician performed an initial assessment on this suspected stroke patient at 8.  History  Chief Complaint  Patient presents with   Code Stroke    [name] is a 77 y.o. male.  77 year old male with a history of cervical disc disease status post spinal fusion and CAD on aspirin and Plavix who presents the emergency department with right upper extremity weakness.  Last known well was 1900 last night.  States that he awoke at 3 AM this morning and had a fall with head strike.  Since then has noticed right upper extremity weakness.  Called 911 from home who noticed his right upper extremity weakness as well and activated a code stroke.  Additional history obtained from the patient who states that yesterday started feeling sick and shaking like he was developing a cold.  Says that he felt like he was going to vomit but did not actually.  Denies any fevers, cough, dysuria, or frequency.  Says he laid down last night because he was not feeling well at 7 PM and was normal.  Woke up at approximately 3 AM and had a fall.  Reports that after the fall he felt like his right upper extremity was weak.  Says that this intermittently has been happening for several months.       Home Medications Prior to Admission medications   Medication Sig Start Date End Date Taking? Authorizing Provider  acetaminophen (TYLENOL) 500 MG tablet Take 1,000 mg by mouth every 6 (six) hours as needed for mild pain.    Yes [provider]  amLODipine (NORVASC) 10 MG tablet Take 10 mg by mouth daily.   Yes [provider]  aspirin 81 MG tablet Take 81 mg by mouth daily.   Yes [provider]  atorvastatin (LIPITOR) 80 MG tablet Take 80 mg by mouth daily. 11/01/21  Yes [provider]  carvedilol (COREG) 25 MG tablet Take 12.5 mg by  mouth 2 (two) times daily with a meal.   Yes [provider]  clopidogrel (PLAVIX) 75 MG tablet Take 75 mg by mouth daily with breakfast.   Yes [provider]  hydrochlorothiazide (HYDRODIURIL) 25 MG tablet Take 25 mg by mouth daily.    Yes [provider]  levothyroxine (SYNTHROID, LEVOTHROID) 50 MCG tablet Take 50 mcg by mouth daily before breakfast. 02/26/15  Yes [provider]  losartan (COZAAR) 100 MG tablet Take 100 mg by mouth daily.   Yes [provider]  metFORMIN (GLUCOPHAGE-XR) 500 MG 24 hr tablet Take 1 tablet (500 mg total) by mouth 2 (two) times daily. 10/02/19  Yes Jettie Booze, MD  NUCYNTA 50 MG tablet Take 100 mg by mouth every 4 (four) hours as needed for pain. 09/04/21  Yes [provider]  polyethylene glycol powder (GLYCOLAX/MIRALAX) 17 GM/SCOOP powder Take 17 g by mouth in the morning.   Yes [provider]  sertraline (ZOLOFT) 100 MG tablet Take 150 mg by mouth 2 (two) times daily. 04/24/20  Yes [provider]  nitroGLYCERIN (NITROSTAT) 0.4 MG SL tablet Place 0.4 mg under the tongue every 5 (five) minutes as needed for chest pain.    [provider]      Allergies    Sulfa antibiotics    Review of Systems   Review of Systems  Physical Exam Updated Vital  Signs BP (!) 146/62   Pulse 87   Temp 99.4 F (37.4 C) (Oral)   Resp (!) 24   Ht 5' 9"$  (1.753 m)   Wt 114.2 kg   SpO2 100%   BMI 37.18 kg/m  Physical Exam Vitals and nursing note reviewed.  Constitutional:      General: He is not in acute distress.    Appearance: He is well-developed.  HENT:     Head: Normocephalic.     Comments: Periorbital ecchymoses    Right Ear: External ear normal.     Left Ear: External ear normal.     Nose: Nose normal.  Eyes:     Extraocular Movements: Extraocular movements intact.     Conjunctiva/sclera: Conjunctivae normal.     Pupils: Pupils are equal, round, and reactive to light.   Neck:     Comments: C-collar in place Cardiovascular:     Rate and Rhythm: Normal rate and regular rhythm.     Heart sounds: Normal heart sounds.  Pulmonary:     Effort: Pulmonary effort is normal. No respiratory distress.     Breath sounds: Normal breath sounds.  Abdominal:     General: There is no distension.     Palpations: Abdomen is soft. There is no mass.     Tenderness: There is no abdominal tenderness. There is no guarding.  Musculoskeletal:     Right lower leg: No edema.     Left lower leg: No edema.  Skin:    General: Skin is warm and dry.  Neurological:     Mental Status: He is alert and oriented to person, place, and time.     Comments: Cranial nerves II through XII intact.  2/5 strength of right upper extremity.  Otherwise 5/5 strength in other extremities.  Decreased sensation to light touch and pinprick in right upper extremity.  Psychiatric:        Mood and Affect: Mood normal.        Behavior: Behavior normal.     ED Results / Procedures / Treatments   Labs (all labs ordered are listed, but only abnormal results are displayed) Labs Reviewed  PROTIME-INR - Abnormal; Notable for the following components:      Result Value   Prothrombin Time 15.9 (*)    INR 1.3 (*)    All other components within normal limits  CBC - Abnormal; Notable for the following components:   WBC 16.6 (*)    RBC 3.98 (*)    Hemoglobin 12.5 (*)    HCT 36.0 (*)    Platelets 145 (*)    All other components within normal limits  DIFFERENTIAL - Abnormal; Notable for the following components:   Neutro Abs 14.6 (*)    Monocytes Absolute 1.1 (*)    Abs Immature Granulocytes 0.09 (*)    All other components within normal limits  COMPREHENSIVE METABOLIC PANEL - Abnormal; Notable for the following components:   Sodium 133 (*)    CO2 20 (*)    Glucose, Bld 176 (*)    BUN 25 (*)    Total Protein 6.3 (*)    Albumin 3.3 (*)    AST 92 (*)    Total Bilirubin 1.5 (*)    All other components  within normal limits  URINALYSIS, ROUTINE W REFLEX MICROSCOPIC - Abnormal; Notable for the following components:   APPearance CLOUDY (*)    Hgb urine dipstick LARGE (*)    Ketones, ur 5 (*)  Protein, ur 30 (*)    Leukocytes,Ua LARGE (*)    Bacteria, UA MANY (*)    All other components within normal limits  I-STAT CHEM 8, ED - Abnormal; Notable for the following components:   BUN 29 (*)    Glucose, Bld 177 (*)    Calcium, Ion 1.09 (*)    TCO2 21 (*)    Hemoglobin 11.9 (*)    HCT 35.0 (*)    All other components within normal limits  CBG MONITORING, ED - Abnormal; Notable for the following components:   Glucose-Capillary 163 (*)    All other components within normal limits  RESP PANEL BY RT-PCR (RSV, FLU A&B, COVID)  RVPGX2  URINE CULTURE  ETHANOL  APTT  RAPID URINE DRUG SCREEN, HOSP PERFORMED  CK    EKG EKG Interpretation  Date/Time:  Wednesday June 04 2022 08:16:05 EST Ventricular Rate:  82 PR Interval:  185 QRS Duration: 97 QT Interval:  426 QTC Calculation: 498 R Axis:   -16 Text Interpretation: Sinus rhythm Borderline left axis deviation Borderline prolonged QT interval Confirmed by Margaretmary Eddy [phone]) on 06/04/2022 10:37:16 AM  Radiology DG Chest Portable 1 View  Result Date: 06/04/2022 CLINICAL DATA:  Chills, weakness, possible stroke, fell yesterday, RIGHT arm weakness, history coronary artery disease, hypertension, diabetes mellitus, former smoker EXAM: PORTABLE CHEST 1 VIEW COMPARISON:  Portable exam 0854 hours compared to 11/08/2015 FINDINGS: Upper normal size of cardiac silhouette. Mediastinal contours and pulmonary vascularity normal. Lungs clear. No infiltrate, pleural effusion or pneumothorax. Prior cervical spine fusion and chronic LEFT rotator cuff tear noted. IMPRESSION: No acute abnormalities. Electronically Signed   By: Lavonia Dana M.D.   On: 06/04/2022 09:07   CT Cervical Spine Wo Contrast  Result Date: 06/04/2022 CLINICAL DATA:  Back pain  after a fall EXAM: CT CERVICAL, THORACIC, AND LUMBAR SPINE WITHOUT CONTRAST TECHNIQUE: Multidetector CT imaging of the cervical, thoracic and lumbar spine was performed without intravenous contrast. Multiplanar CT image reconstructions were also generated. RADIATION DOSE REDUCTION: This exam was performed according to the departmental dose-optimization program which includes automated exposure control, adjustment of the mA and/or kV according to patient size and/or use of iterative reconstruction technique. COMPARISON:  Cervical spine CT 11/14/2017, CT abdomen/pelvis 12/07/2016, lumbar spine MRI 11/13/2015, two-view chest radiograph 11/08/2015 FINDINGS: CT CERVICAL SPINE FINDINGS Alignment: Normal. There is no antero or retrolisthesis. There is no jumped or perched facet or other evidence of traumatic malalignment. Skull base and vertebrae: Skull base alignment is maintained. Vertebral body heights are preserved. There is no evidence of acute fracture. There is no suspicious osseous lesion postsurgical changes reflecting C5 through C7 ACDF are noted. Hardware alignment is within expected limits, without evidence of complication. There is solid fusion across the disc spaces. Soft tissues and spinal canal: No prevertebral fluid or swelling. No visible canal hematoma. Disc levels: There is mild degenerative change at the C1-C2 articulation with degenerative pannus about the dens. There is no evidence of significant narrowing of the craniocervical junction. There is multilevel facet arthropathy most advanced on the left at C2-C3 and C3-C4. There is no evidence of high-grade spinal canal or neural foraminal stenosis. CT THORACIC SPINE FINDINGS Alignment: Normal. There is no jumped or perched facet or other evidence of traumatic malalignment. Vertebrae: Vertebral body heights are preserved. There is no evidence of acute fracture. There is no suspicious osseous lesion. Paraspinal and other soft tissues: Unremarkable. Disc  levels: There is multilevel degenerative endplate change with flowing anterior osteophytes in  the thoracic spine consistent with diffuse idiopathic skeletal hyperostosis. There is overall mild multilevel facet arthropathy. There is no evidence of significant spinal canal or neural foraminal stenosis. CT LUMBAR SPINE FINDINGS Segmentation: Standard; the lowest formed disc space is designated L5-S1. Alignment: Normal. There is no jumped or perched facet or other evidence of traumatic malalignment. Vertebrae: Vertebral body heights are preserved. There is no evidence of acute fracture. There is no suspicious osseous lesion. Postsurgical changes reflecting posterior instrumented fusion and decompression there is solid fusion across the disc spaces and posterior elements. From L3 through S1 are noted. There is no evidence of hardware related complication. Paraspinal and other soft tissues: There is a 0.8 cm nonobstructing right renal stone. A left upper pole renal cyst is noted requiring no specific imaging follow-up. Disc levels: T12-L1: There is a partially calcified disc protrusion resulting mild spinal canal stenosis. No significant neural foraminal stenosis L1-L2: No significant spinal canal or neural foraminal stenosis L2-L3: There is a disc bulge, degenerative endplate spurring, and mild facet arthropathy resulting in severe spinal canal stenosis and mild left and no significant right neural foraminal stenosis L3-L4: There is a disc bulge, degenerative endplate spurring, and moderate bilateral facet arthropathy resulting severe spinal canal stenosis and moderate to severe bilateral neural foraminal stenosis L4-L5: Status post posterior instrumented fusion and decompression without convincing evidence of significant spinal canal or neural foraminal stenosis L5-S1: Status post posterior instrumented fusion and decompression. Endplate spurring resulting in mild-to-moderate bilateral neural foraminal stenosis. No  evidence of significant spinal canal stenosis IMPRESSION: 1. No evidence of acute fracture or traumatic malalignment of the cervical, thoracic, or lumbar spine. 2. Postsurgical changes in the cervical and lumbar spine as above without evidence of complication. 3. Multifactorial severe spinal canal stenosis at L2-L3 and L3-L4 and moderate to severe bilateral neural foraminal stenosis at L3-L4. 4. 0.8 cm nonobstructing right renal stone. Electronically Signed   By: Valetta Mole M.D.   On: 06/04/2022 08:34   CT Lumbar Spine Wo Contrast  Result Date: 06/04/2022 CLINICAL DATA:  Back pain after a fall EXAM: CT CERVICAL, THORACIC, AND LUMBAR SPINE WITHOUT CONTRAST TECHNIQUE: Multidetector CT imaging of the cervical, thoracic and lumbar spine was performed without intravenous contrast. Multiplanar CT image reconstructions were also generated. RADIATION DOSE REDUCTION: This exam was performed according to the departmental dose-optimization program which includes automated exposure control, adjustment of the mA and/or kV according to patient size and/or use of iterative reconstruction technique. COMPARISON:  Cervical spine CT 11/14/2017, CT abdomen/pelvis 12/07/2016, lumbar spine MRI 11/13/2015, two-view chest radiograph 11/08/2015 FINDINGS: CT CERVICAL SPINE FINDINGS Alignment: Normal. There is no antero or retrolisthesis. There is no jumped or perched facet or other evidence of traumatic malalignment. Skull base and vertebrae: Skull base alignment is maintained. Vertebral body heights are preserved. There is no evidence of acute fracture. There is no suspicious osseous lesion postsurgical changes reflecting C5 through C7 ACDF are noted. Hardware alignment is within expected limits, without evidence of complication. There is solid fusion across the disc spaces. Soft tissues and spinal canal: No prevertebral fluid or swelling. No visible canal hematoma. Disc levels: There is mild degenerative change at the C1-C2  articulation with degenerative pannus about the dens. There is no evidence of significant narrowing of the craniocervical junction. There is multilevel facet arthropathy most advanced on the left at C2-C3 and C3-C4. There is no evidence of high-grade spinal canal or neural foraminal stenosis. CT THORACIC SPINE FINDINGS Alignment: Normal. There is no jumped or  perched facet or other evidence of traumatic malalignment. Vertebrae: Vertebral body heights are preserved. There is no evidence of acute fracture. There is no suspicious osseous lesion. Paraspinal and other soft tissues: Unremarkable. Disc levels: There is multilevel degenerative endplate change with flowing anterior osteophytes in the thoracic spine consistent with diffuse idiopathic skeletal hyperostosis. There is overall mild multilevel facet arthropathy. There is no evidence of significant spinal canal or neural foraminal stenosis. CT LUMBAR SPINE FINDINGS Segmentation: Standard; the lowest formed disc space is designated L5-S1. Alignment: Normal. There is no jumped or perched facet or other evidence of traumatic malalignment. Vertebrae: Vertebral body heights are preserved. There is no evidence of acute fracture. There is no suspicious osseous lesion. Postsurgical changes reflecting posterior instrumented fusion and decompression there is solid fusion across the disc spaces and posterior elements. From L3 through S1 are noted. There is no evidence of hardware related complication. Paraspinal and other soft tissues: There is a 0.8 cm nonobstructing right renal stone. A left upper pole renal cyst is noted requiring no specific imaging follow-up. Disc levels: T12-L1: There is a partially calcified disc protrusion resulting mild spinal canal stenosis. No significant neural foraminal stenosis L1-L2: No significant spinal canal or neural foraminal stenosis L2-L3: There is a disc bulge, degenerative endplate spurring, and mild facet arthropathy resulting in  severe spinal canal stenosis and mild left and no significant right neural foraminal stenosis L3-L4: There is a disc bulge, degenerative endplate spurring, and moderate bilateral facet arthropathy resulting severe spinal canal stenosis and moderate to severe bilateral neural foraminal stenosis L4-L5: Status post posterior instrumented fusion and decompression without convincing evidence of significant spinal canal or neural foraminal stenosis L5-S1: Status post posterior instrumented fusion and decompression. Endplate spurring resulting in mild-to-moderate bilateral neural foraminal stenosis. No evidence of significant spinal canal stenosis IMPRESSION: 1. No evidence of acute fracture or traumatic malalignment of the cervical, thoracic, or lumbar spine. 2. Postsurgical changes in the cervical and lumbar spine as above without evidence of complication. 3. Multifactorial severe spinal canal stenosis at L2-L3 and L3-L4 and moderate to severe bilateral neural foraminal stenosis at L3-L4. 4. 0.8 cm nonobstructing right renal stone. Electronically Signed   By: Valetta Mole M.D.   On: 06/04/2022 08:34   CT Thoracic Spine Wo Contrast  Result Date: 06/04/2022 CLINICAL DATA:  Back pain after a fall EXAM: CT CERVICAL, THORACIC, AND LUMBAR SPINE WITHOUT CONTRAST TECHNIQUE: Multidetector CT imaging of the cervical, thoracic and lumbar spine was performed without intravenous contrast. Multiplanar CT image reconstructions were also generated. RADIATION DOSE REDUCTION: This exam was performed according to the departmental dose-optimization program which includes automated exposure control, adjustment of the mA and/or kV according to patient size and/or use of iterative reconstruction technique. COMPARISON:  Cervical spine CT 11/14/2017, CT abdomen/pelvis 12/07/2016, lumbar spine MRI 11/13/2015, two-view chest radiograph 11/08/2015 FINDINGS: CT CERVICAL SPINE FINDINGS Alignment: Normal. There is no antero or retrolisthesis.  There is no jumped or perched facet or other evidence of traumatic malalignment. Skull base and vertebrae: Skull base alignment is maintained. Vertebral body heights are preserved. There is no evidence of acute fracture. There is no suspicious osseous lesion postsurgical changes reflecting C5 through C7 ACDF are noted. Hardware alignment is within expected limits, without evidence of complication. There is solid fusion across the disc spaces. Soft tissues and spinal canal: No prevertebral fluid or swelling. No visible canal hematoma. Disc levels: There is mild degenerative change at the C1-C2 articulation with degenerative pannus about the dens. There is  no evidence of significant narrowing of the craniocervical junction. There is multilevel facet arthropathy most advanced on the left at C2-C3 and C3-C4. There is no evidence of high-grade spinal canal or neural foraminal stenosis. CT THORACIC SPINE FINDINGS Alignment: Normal. There is no jumped or perched facet or other evidence of traumatic malalignment. Vertebrae: Vertebral body heights are preserved. There is no evidence of acute fracture. There is no suspicious osseous lesion. Paraspinal and other soft tissues: Unremarkable. Disc levels: There is multilevel degenerative endplate change with flowing anterior osteophytes in the thoracic spine consistent with diffuse idiopathic skeletal hyperostosis. There is overall mild multilevel facet arthropathy. There is no evidence of significant spinal canal or neural foraminal stenosis. CT LUMBAR SPINE FINDINGS Segmentation: Standard; the lowest formed disc space is designated L5-S1. Alignment: Normal. There is no jumped or perched facet or other evidence of traumatic malalignment. Vertebrae: Vertebral body heights are preserved. There is no evidence of acute fracture. There is no suspicious osseous lesion. Postsurgical changes reflecting posterior instrumented fusion and decompression there is solid fusion across the disc  spaces and posterior elements. From L3 through S1 are noted. There is no evidence of hardware related complication. Paraspinal and other soft tissues: There is a 0.8 cm nonobstructing right renal stone. A left upper pole renal cyst is noted requiring no specific imaging follow-up. Disc levels: T12-L1: There is a partially calcified disc protrusion resulting mild spinal canal stenosis. No significant neural foraminal stenosis L1-L2: No significant spinal canal or neural foraminal stenosis L2-L3: There is a disc bulge, degenerative endplate spurring, and mild facet arthropathy resulting in severe spinal canal stenosis and mild left and no significant right neural foraminal stenosis L3-L4: There is a disc bulge, degenerative endplate spurring, and moderate bilateral facet arthropathy resulting severe spinal canal stenosis and moderate to severe bilateral neural foraminal stenosis L4-L5: Status post posterior instrumented fusion and decompression without convincing evidence of significant spinal canal or neural foraminal stenosis L5-S1: Status post posterior instrumented fusion and decompression. Endplate spurring resulting in mild-to-moderate bilateral neural foraminal stenosis. No evidence of significant spinal canal stenosis IMPRESSION: 1. No evidence of acute fracture or traumatic malalignment of the cervical, thoracic, or lumbar spine. 2. Postsurgical changes in the cervical and lumbar spine as above without evidence of complication. 3. Multifactorial severe spinal canal stenosis at L2-L3 and L3-L4 and moderate to severe bilateral neural foraminal stenosis at L3-L4. 4. 0.8 cm nonobstructing right renal stone. Electronically Signed   By: Valetta Mole M.D.   On: 06/04/2022 08:34   CT HEAD CODE STROKE WO CONTRAST  Result Date: 06/04/2022 CLINICAL DATA:  Code stroke. EXAM: CT HEAD WITHOUT CONTRAST TECHNIQUE: Contiguous axial images were obtained from the base of the skull through the vertex without intravenous  contrast. RADIATION DOSE REDUCTION: This exam was performed according to the departmental dose-optimization program which includes automated exposure control, adjustment of the mA and/or kV according to patient size and/or use of iterative reconstruction technique. COMPARISON:  CT head 11/14/2017 FINDINGS: Brain: There is no acute intracranial hemorrhage, extra-axial fluid collection, or acute infarct. Parenchymal volume is normal for age. The ventricles are normal in size. Gray-white differentiation is preserved patchy hypodensity in the supratentorial white matter likely reflects sequela of chronic small-vessel ischemic change. The pituitary and suprasellar region are normal. There is no mass lesion. There is no mass effect or midline shift. Vascular: There is calcification of the bilateral carotid siphons and vertebral arteries. No hyperdense vessel is seen. Skull: Choose Sinuses/Orbits: The paranasal sinuses are clear.  Bilateral lens implants are in place. The globes and orbits are otherwise unremarkable. Other: None. ASPECTS [facility] Stroke Program Early CT Score) - Ganglionic level infarction (caudate, lentiform nuclei, internal capsule, insula, M1-M3 cortex): 7 - Supraganglionic infarction (M4-M6 cortex): 3 Total score (0-10 with 10 being normal): 10 IMPRESSION: No acute intracranial pathology. Findings communicated to Dr Curly Shores via Shea Evans at 8:11 am. Electronically Signed   By: Valetta Mole M.D.   On: 06/04/2022 08:12    Procedures Procedures   Medications Ordered in ED Medications  enoxaparin (LOVENOX) injection 40 mg (has no administration in time range)  cefTRIAXone (ROCEPHIN) 1 g in sodium chloride 0.9 % 100 mL IVPB (0 g Intravenous Stopped 06/04/22 1151)    ED Course/ Medical Decision Making/ A&P Clinical Course as of 06/04/22 1229  Wed Jun 04, 2022  0913 Dr Cyndy Freeze from neurosurgery feels that patient could have myelogram for cervical spine imaging if unable to tolerate MRI.  Doesn't feel  that localizes to cervical spine with his current exam and history.  Feels that cord compression is also less likely given the intermittent nature of the symptoms. [RP]  [id number] Patient scheduled for MRI 9:30 AM tomorrow [RP]  1100 Dr Agustin Cree from teaching service to admit the patient. [RP]    Clinical Course User Index [RP] Fransico Meadow, MD                            Medical Decision Making Amount and/or Complexity of Data Reviewed Labs: ordered. Radiology: ordered.  Risk Decision regarding hospitalization.   [name] is a 77 y.o. male with comorbidities that complicate the patient evaluation including cervical disc disease status post spinal fusion and CAD on aspirin and Plavix who presents the emergency department with right upper extremity weakness.   Initial Ddx:  Stroke, spinal cord injury, C-spine injury, TBI, vertebral injury, pneumonia, UTI  MDM:  Code stroke activated given the patient's symptoms and last known well.  Initially concerned about possible stroke or spinal cord injury given the patient's right upper extremity weakness.  However, appears to be subacute in nature and recurrent so this is atypical for cervical spine injury.  Could represent a stuttering symptoms of a stroke.  Will assess for C-spine injury and TBI with CT.  With his back pain we will also obtain CT scans to rule out possible fractures from his fall and his lumbar thoracic spine.  No other traumatic injuries noted.  With his chills will assess for pneumonia and urinary tract infection as well although does not have any overt symptoms of another infectious process.  Plan:  Labs CBG Urinalysis COVID/flu Chest x-ray CT head CT C-spine, thoracic spine, lumbar spine Neurology consult  ED Summary/Re-evaluation:  Neurology (Dr. Curly Shores) evaluated the patient and felt that LVO was unlikely.  Code stroke CT without contrast without acute abnormality as well as his spine imaging.  Patient does  have persistent right upper extremity weakness and is unclear if this is from a spinal cord injury or stroke so will require MRI.  Patient says that he will require general anesthesia and after attempting to convince the patient to obtain the MRI with anxiolysis (which he could declined) I have placed order for sedated MRI under general anesthesia.  Also discussed with neurosurgery who feels that the patient could have a myelogram to evaluate for cervical spine injury.  Neurology also stating that he could potentially have a dry head CT tomorrow  as an alternative to the sedated MRI.  These options were discussed with medicine who will admit the patient.  He was incidentally found to have urinary tract infection on his urinalysis which was treated with ceftriaxone.  This patient presents to the ED for concern of complaints listed in HPI, this involves an extensive number of treatment options, and is a complaint that carries with it a high risk of complications and morbidity. Disposition including potential need for admission considered.   Dispo: Admit to Floor  Additional history obtained from EMS Records reviewed Outpatient Clinic Notes The following labs were independently interpreted: Urinalysis and show urinary tract infection I independently reviewed the following imaging with scope of interpretation limited to determining acute life threatening conditions related to emergency care: CT Head and agree with the radiologist interpretation with the following exceptions: none I personally reviewed and interpreted cardiac monitoring: normal sinus rhythm  I personally reviewed and interpreted the pt's EKG: see above for interpretation  I have reviewed the patients home medications and made adjustments as needed Consults: Hospitalist, Neurology, and Neurosurgery  Final Clinical Impression(s) / ED Diagnoses Final diagnoses:  Weakness of right upper extremity  Acute cystitis without hematuria  Fall,  initial encounter    Rx / DC Orders ED Discharge Orders     None      CRITICAL CARE Performed by: Fransico Meadow   Total critical care time: 30 minutes  Critical care time was exclusive of separately billable procedures and treating other patients.  Critical care was necessary to treat or prevent imminent or life-threatening deterioration.  Critical care was time spent personally by me on the following activities: development of treatment plan with patient and/or surrogate as well as nursing, discussions with consultants, evaluation of patient's response to treatment, examination of patient, obtaining history from patient or surrogate, ordering and performing treatments and interventions, ordering and review of laboratory studies, ordering and review of radiographic studies, pulse oximetry and re-evaluation of patient's condition.     Fransico Meadow, MD 06/04/22 [phone]

## 2022-06-04 NOTE — Procedures (Signed)
Patient Name: [name]  MRN: [id number]  Epilepsy Attending: Lora Havens  Referring Physician/Provider: Lorenza Chick, MD  Date: 06/04/2022 Duration: 21.10 mins  Patient history: 67 to M presenting with acute right upper extremity weakness after recent fall with associated numbness. EEG to evaluate for seizure  Level of alertness: Awake  AEDs during EEG study: None  Technical aspects: This EEG study was done with scalp electrodes positioned according to the 10-20 International system of electrode placement. Electrical activity was reviewed with band pass filter of 1-70Hz$ , sensitivity of 7 uV/mm, display speed of 63m/sec with a 60Hz$  notched filter applied as appropriate. EEG data were recorded continuously and digitally stored.  Video monitoring was available and reviewed as appropriate.  Description: No clear posterior dominant rhythm was seen. EEG showed continuous generalized 3 to 7 Hz theta-delta slowing.  Hyperventilation and photic stimulation were not performed.     Of note, EEG was technically difficult due to significant electrode artifact.  ABNORMALITY - Continuous slow, generalized  IMPRESSION: This technically difficult study is suggestive of moderate diffuse encephalopathy, nonspecific etiology.  No seizures or epileptiform discharges were seen throughout the recording.  If suspicion for interictal activity remains a concern, a repeat study can be considered.   Evalina Tabak OBarbra Sarks

## 2022-06-04 NOTE — Progress Notes (Signed)
Pt received from ED, no report given. Pt c/o 10/10 back pain. MD notified. Pt oriented to 3W processes. NSR on telemetry. VS wnL and as per flow. Will continue to monitor and maintain safety.

## 2022-06-04 NOTE — Progress Notes (Signed)
Patient ID: [name], male   DOB: Apr 12, 1946, 77 y.o.   MRN: [id number] BP (!) 127/57 (BP Location: Right Arm)   Pulse 85   Temp 99.6 F (37.6 C) (Oral)   Resp 20   Ht 5' 9"$  (1.753 m)   Wt 114.2 kg   SpO2 98%   BMI 37.18 kg/m  As of now I have not been consulted because there is no neurosurgical problem identified, nor a question that I am able to answer. I await either the myelogram, or MRI to be completed. There is insufficient anatomic information to ascertain an etiology for the upper extremity weakness.

## 2022-06-04 NOTE — H&P (Cosign Needed Addendum)
Date: 06/04/2022               Patient Name:  [name] MRN: [id number]  DOB: December 19, 1945 Age / Sex: 77 y.o., male   PCP: Cyndi Bender, PA-C         Medical Service: Internal Medicine Teaching Service         Attending Physician: Dr. Aldine Contes, MD      First Contact: Dr. Gaylyn Rong, MD Pager [phone]    Second Contact: Dr. Madelyn Flavors, MD Pager [phone]         After Hours (After 5p/  First Contact Pager: [phone]  weekends / holidays): Second Contact Pager: [phone]   SUBJECTIVE   Chief Complaint: Right arm weakness, fall  History of Present Illness:   Mr. Kan is a 77 y/o person living with a history of chronic back pain with left lower extremity radicular pain, HTN, hypothyroidism, CAD s/p PCI 1999, T2DM, gout, GERD, hearing impairment and depression who presents after a fall out of bed and left arm weakness.   He has frequent falls due to his chronic left lower extremity weakness and endorses episodes of it "giving out." He notes he falls 2-3 times per month.   He felt nauseated yesterday and had a few episodes of shaking in his upper extremities only. Because he was not feeling well he went to sleep around 7-8pm. He woke up two hours later on the floor. When he woke, he noticed that his room was messed up with a lamp on the floor and a dresser moved. His walker was also in the living room which he does not remember prior to going to bed. He tried getting himself up on his bed but fell and hit the left side of his face on a bracket. Denies loss of consciousness. He crawled into the living room and while doing this noticed he was unable to use his right arm. He called 911, he believes he was down for approximately 4 hours.  Denies recent illness, fever, changes in vision, speech, or swallowing. Denies changes in his left or lower extremities. No difficulties passing his bowels or urinating.  Since his fall he has had central neck pain and has been unable to use  his right arm, he endorses weakness from his shoulder to his hand. He has numbness/tingling from his in the RUE but describes his forearm/hand as "falling asleep." Denies pain in the right arm. Denies similar symptoms in the past. Per EMS report, patient told them this has happened before and resolved on its own.   ED Course: Evaluated by neurology in the ED with concern for CVA with inability to move his right arm. CT without contrast was without acute infarct. Unable to obtain MRI with patient's severe fear of MRI's. In the past he has required general anesthesia. Neurosurgery also consulted who recommend myelogram.    Meds:  Current Meds  Medication Sig   acetaminophen (TYLENOL) 500 MG tablet Take 1,000 mg by mouth every 6 (six) hours as needed for mild pain.    amLODipine (NORVASC) 10 MG tablet Take 10 mg by mouth daily.   aspirin 81 MG tablet Take 81 mg by mouth daily.   atorvastatin (LIPITOR) 80 MG tablet Take 80 mg by mouth daily.   carvedilol (COREG) 25 MG tablet Take 12.5 mg by mouth 2 (two) times daily with a meal.   clopidogrel (PLAVIX) 75 MG tablet Take 75 mg by mouth daily with breakfast.   hydrochlorothiazide (HYDRODIURIL)  25 MG tablet Take 25 mg by mouth daily.    levothyroxine (SYNTHROID, LEVOTHROID) 50 MCG tablet Take 50 mcg by mouth daily before breakfast.   losartan (COZAAR) 100 MG tablet Take 100 mg by mouth daily.   metFORMIN (GLUCOPHAGE-XR) 500 MG 24 hr tablet Take 1 tablet (500 mg total) by mouth 2 (two) times daily.   NUCYNTA 50 MG tablet Take 100 mg by mouth every 4 (four) hours as needed for pain.   polyethylene glycol powder (GLYCOLAX/MIRALAX) 17 GM/SCOOP powder Take 17 g by mouth in the morning.   sertraline (ZOLOFT) 100 MG tablet Take 150 mg by mouth 2 (two) times daily.    Past Medical History Nephrolithiasis HTN Hypothyroidism CAD s/p PCI 1999 Paroxysmal atrial tachycardia Chronic lumbar pain Lumbar post-laminectomy syndrome T2DM Gout GERD Hearing  impairment Depression  Past Surgical History:  Procedure Laterality Date   ANTERIOR CERVICAL DECOMP/DISCECTOMY FUSION  04/2007   C4-5; C6-7   BACK SURGERY     "3 times; last time was screws in lower back 10/2008"   CHOLECYSTECTOMY  1970's   COLONOSCOPY     CORONARY ANGIOPLASTY  1999/2013   1 stent   CYSTOSCOPY     LEFT HEART CATH AND CORONARY ANGIOGRAPHY N/A 09/30/2019   Procedure: LEFT HEART CATH AND CORONARY ANGIOGRAPHY;  Surgeon: Jettie Booze, MD;  Location: [facility];  Service: Cardiovascular;  Laterality: N/A;   LEFT HEART CATHETERIZATION WITH CORONARY ANGIOGRAM N/A 08/21/2011   Procedure: LEFT HEART CATHETERIZATION WITH CORONARY ANGIOGRAM;  Surgeon: Sinclair Grooms, MD;  Location: [facility] C[facility];  Service: Cardiovascular;  Laterality: N/A;   LITHOTRIPSY     LUMBAR LAMINECTOMY/DECOMPRESSION MICRODISCECTOMY Left 06/14/2014   Procedure: LEFT LUMBAR THREE-FOUR LANINOTOMY AND MICRODISKECTOMY.;  Surgeon: Hosie Spangle, MD;  Location: [facility];  Service: Neurosurgery;  Laterality: Left;  left   PCI  5/09/2-13   LAD   RADIOLOGY WITH ANESTHESIA N/A 11/13/2015   Procedure: MRI OF LUMBAR SPINE W/WO CONTRAST    (RADIOLOGY WITH ANESTHESIA);  Surgeon: Medication Radiologist, MD;  Location: [facility];  Service: Radiology;  Laterality: N/A;   SHOULDER ARTHROSCOPY W/ ROTATOR CUFF REPAIR  ~ 2011   left    Social:  Lives alone Occupation: retired, 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 Able to perform ADL/IADL PCP: Cyndi Bender PA with [facility] Substances: quit smoking 30-40 years ago. 4-5 year 1/2 ppd. Denies alcohol or other substance use.   Family History:  Mother - CHF.  Father - Colon cancer 65. Patient notes benign colonoscopies, up to date Brother - Passed from cardiac disease at 56.  Sister - Healthy  Allergies: Allergies as of 06/04/2022 - Review Complete 06/04/2022  Allergen  Reaction Noted   Sulfa antibiotics Rash 07/30/2011   Review of Systems: A complete ROS was negative except as per HPI.   OBJECTIVE:   Physical Exam: Blood pressure (!) 146/62, pulse 87, temperature 97.9 F (36.6 C), temperature source Oral, resp. rate (!) 24, height 5' 9"$  (1.753 m), weight 114.2 kg, SpO2 100 %.  Constitutional: elderly appearing HENT: ecchymosis to left eye, forehead. No bruising behind ears.  Eyes: conjunctiva non-erythematous.  Neck: supple. C-collar in place.  Cardiovascular: regular rate and rhythm, no m/r/g Pulmonary/Chest: normal work of breathing on room air, lungs clear to auscultation  Abdominal: soft, non-tender, non-distended MSK: normal bulk and tone. No obvious deformity of spine or point tenderness. No  step off deformity. Limited with c-collar in place. No muscular or bony deformity of right upper extremity. Capillary refill 2 sec. 2+ radial pulse RUE.  Skin: warm and dry Psych: normal mood  Neurological: alert & oriented x 3, 1/5 strength in right upper extremity. Absent brachioradialis and tricep reflex on the right, 2+ left bradioradialis, unable to ilict tricep reflex on left. Shoulder strength 5/5 bilaterally. Strength 4/5 left upper extremity and left lower extremity. Unable to perform finger to nose with right arm, left normal. Heel to shin norma. No pronator drift of left arm. 5/5 strength right lower extremity. Sensation intact throughout. No facial asymmetry. No uvular deviation or tongue fasciculations. Pupils equal round reactive to light.   Labs: CBC    Component Value Date/Time   WBC 16.6 (H) 06/04/2022 0754   RBC 3.98 (L) 06/04/2022 0754   HGB 11.9 (L) 06/04/2022 0800   HGB 13.5 09/23/2019 1020   HCT 35.0 (L) 06/04/2022 0800   HCT 39.9 09/23/2019 1020   PLT 145 (L) 06/04/2022 0754   PLT 252 09/23/2019 1020   MCV 90.5 06/04/2022 0754   MCV 89 09/23/2019 1020   MCH 31.4 06/04/2022 0754   MCHC 34.7 06/04/2022 0754   RDW 13.1  06/04/2022 0754   RDW 12.9 09/23/2019 1020   LYMPHSABS 0.7 06/04/2022 0754   LYMPHSABS 1.6 09/23/2019 1020   MONOABS 1.1 (H) 06/04/2022 0754   EOSABS 0.0 06/04/2022 0754   EOSABS 0.3 09/23/2019 1020   BASOSABS 0.0 06/04/2022 0754   BASOSABS 0.1 09/23/2019 1020     CMP     Component Value Date/Time   NA 136 06/04/2022 0800   NA 137 09/23/2019 1020   K 3.8 06/04/2022 0800   CL 104 06/04/2022 0800   CO2 20 (L) 06/04/2022 0754   GLUCOSE 177 (H) 06/04/2022 0800   BUN 29 (H) 06/04/2022 0800   BUN 15 09/23/2019 1020   CREATININE 0.80 06/04/2022 0800   CALCIUM 8.9 06/04/2022 0754   PROT 6.3 (L) 06/04/2022 0754   ALBUMIN 3.3 (L) 06/04/2022 0754   AST 92 (H) 06/04/2022 0754   ALT 35 06/04/2022 0754   ALKPHOS 45 06/04/2022 0754   BILITOT 1.5 (H) 06/04/2022 0754   GFRNONAA >60 06/04/2022 0754   GFRAA 115 09/23/2019 1020    Imaging:  CT head w/o contrast - no acute intracranial pathology  CT cervical, thoracic, and lumbar spine w/o contrast - no evidence of fracture or traumatic malalignment of the spine. Postsurgical changes in cervical and lumbar spine. Multifactorial severe spinal stenosis at L2-L3 and L3-L4 and moderate bilateral neural foraminal stenosis at L3-L4. Non-obstructing renal stone.   Chest xray - poor inspiration. No focal opacity or abnormalities noted.   EKG: personally reviewed my interpretation is sinus rhythm with a rate of 80 bpm. LAD. Normal intervals. QTC prolongation. No ST wave abnormalities.  Prior EKG 04/2021 with change of sinus bradycardia. No other significant difference.   ASSESSMENT & PLAN:   Assessment & Plan by Problem: Principal Problem:   Right sided weakness Active Problems:   Weakness of right upper extremity  TRAVARUS CERROS is a 77 y.o. person living with a history of chronic back pain with left lower extremity radicular pain, HTN, hypothyroidism, CAD s/p PCI 1999, T2DM, gout, GERD, hearing impairment and depression who presents after a  fall out of bed and left arm weakness and admitted for further work up with concern for brachial plexus/cervical injury vs acute CVA  Right upper extremity weakness Patient  presented with right upper extremity weakness after unwitnessed fall from bed and laying on the right arm for an unknown period of time. Unclear if RUE weakness occurred in the past, patient denies it but told EMS before he had difficulties with strength in his right arm that resolved on their own. CT imaging negative for any underlying pathology but need MRI to further characterize and determine if CVA. Endorses adherence to DAPT and lipid lowering therapies. Exam is difficult to determine etiology but no other focal neurological deficits. Left lower extremity weakness seems to be chronic. With history of laying on right sided for period of time and waking with right arm weakness, suspicious for for brachial plexus injury vs cervical radiculopathy. Will evaluate over next 24 hours and determine next imaging. He has needed general anesthesia for MRI. Discussed with charge CRNA no availability to help for next 24 hours or so. -reevaluate tomorrow and determine if further imaging with MRI or CT myelogram warranted. Will see if brachial plexus [place] available here -continue home aspirin, plavix, and atorvastatin -EEG pending -pt/ot consult, currently lives alone and independent in ADL/IADL. If no improvement will need significantly more assistance --neuro checks q4h, worsening neurological symptoms stat head CT -lipid panel/A1c pending -continue c-collar until cleared with MRI -agree with Dr. Curly Shores of neurology, does not seem cardiac in etiology. Hold off on echocardiogram for now  Recurrent falls Fall from bed History of recurrent falls with left lower extremity weakness likely from prior lumbar spine pathology/procedures and potential PAD with abnormal outpatient ABI's of left lower extremity. No evidence of worsening of this. Unclear  what led him to falling out of bed and why items around house were in different positions when he woke. EMS run sheet did not mention incontinence, no lesions on the tongue. EEG to assess for any seizure/post-ictal activity.  -EEG pending -PT/OT -follow up outpatient with PCP/cardiology for suspected PAD and possible contribution to falls  Elevated CK - mild rhabdomyolysis Hematuria Elevated AST CK level of 4,751. Hemoglobinuria with some RBC. Likely from patient being down on ground for extended period of time. Isolated AST also explained by this, do not suspect hepatic pathology.  -LR infusion 75 cc for 8 hour -repeat CK later this evening -repeat AST tomorrow -Will need repeat UA outpatient to follow up hemoglobinuria  Chronic back pain Lumbar post-laminectomy syndrome Hx of back pain secondary in cervical and lumbar spine. Herniated disc at L3-L4, lumbar spondylosis and radiculopathy. 2016 he underwent L3 hemilaminotomy and microdiscectomy. S/p anterior cervical discectomy 2009. Since he has continued to have radicular symptoms with left lower extremity weakness and follows with Previously followed with [facility] pain clinic where he receives tapentadol does not appear he is still following with them. Last fill date of 11/26/21, no longer being prescribed or following with them. CT imaging without acute abnormalities. Unclear how functional he truly is at home living alone.  -continue tylenol. Try to avoid opioids with encephalopathy -topical analgesics  -frequently move in bed to help with positioning -PT/OT evaluation pending  Pyuria Patient denies urinary symptoms. Afebrile. Does have leukocytosis and UA with large leukocytes, many bacteria, few squamous epithelium, and hemoglobinuria. He received one dose of ceftriaxone in ED. Without urinary symptoms will refrain from treating further and follow cultures drawn by ED. If begins to have fevers, urinary symptoms, or growing significant  amount of bacteria with cultures will reinitiate treatment. -continue to monitor for urinary symptoms and systemic signs of infection.   Leukocytosis Isolated leukocytosis with  neutrophilic predominance. He has remained afebrile during his hospitalization. Does note nausea and shaking of his upper extremities yesterday. Chest xray without focal opacity. While UA with pyuria and bacteruria, he is asymptomatic denying urinary symptoms. Likely from his rhabdomyolysis and being down extended period of time. Otherwise no obvious source of infection.  -repeat CBC tomorrow -monitor for fevers or other systemic symptoms concerning for infectious etiology  Normocytic anemia Thrombocytopenia No hx of prior anemia or thrombocytopenia. Repeat tomorrow morning and if persistent or worsening would obtain reticulocyte and blood smear. No active sign of bleeding. Do not suspect hemolysis -repeat CBC tomorrow  Anion gap metabolic acidosis Likely from mild rhabdomyolysis. IV fluids ordered.  -repeat CMP tomorrow  CAD Paroxysmal atrial tachycardia Follows with Dr. Harriet Masson of [facility] Hx of CAD PCI in 2013. Most recent catheterization in 2021 with no further intervention. Denied chest pain, low ssupciian for cardiac etiology for his presentation. -continue aspirin, plavix, and atorvastatin  -continue carvedilol  T2DM Last A1c in our charts of 6.8% in 2017. Repeat A1c pending. Home regimen of metformin.  -A1c pending -if elevated glucose levels can add SSI and basal insulin during admission  Depression -continue sertraline  Hypothyroidism -continue home levothyroxine 50 mcg daily  HTN Normotensive, hold home antihypertensives of losartan and amlodipine  Hx of nephrolithiass -non-obstructing .8 cm renal stone of right kidney. Denies off-midline back pain.  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  Signed: Riesa Pope, MD Internal Medicine Resident PGY-3  06/04/2022, 2:19 PM

## 2022-06-04 NOTE — ED Triage Notes (Signed)
Pt to ED via EMS from home as a possible code stroke. Pt had a fall yesterday and LKW was 06/03/22 @1900$ . EMS reports symptoms of right side arm weakness. Upon arrival to ED pt is Aox4, speech clear with bruising noted to left eye. Pt is not able to hold up his right arm, and c/o back pain. Pt denies any other injuries. Code stroke RN & neurologist at bridge and activated code stroke. Blood collected and pt transported to CT. BP 115/42 HR 86 98% on RA, BGL 163

## 2022-06-04 NOTE — ED Notes (Signed)
..ED TO INPATIENT HANDOFF REPORT  ED Nurse Name and Phone #:  [phone]   S Name/Age/Gender [name] 77 y.o. male Room/Bed: 025C/025C  Code Status   Code Status: Full Code  Home/SNF/Other Home Patient oriented to: self, place, time, and situation Is this baseline? Yes   Triage Complete: Triage complete  Chief Complaint Right sided weakness [R53.1] Weakness of right upper extremity [R29.898]  Triage Note Pt to ED via EMS from home as a possible code stroke. Pt had a fall yesterday and LKW was 06/03/22 @1900$ . EMS reports symptoms of right side arm weakness. Upon arrival to ED pt is Aox4, speech clear with bruising noted to left eye. Pt is not able to hold up his right arm, and c/o back pain. Pt denies any other injuries. Code stroke RN & neurologist at bridge and activated code stroke. Blood collected and pt transported to CT. BP 115/42 HR 86 98% on RA, BGL 163   Allergies Allergies  Allergen Reactions   Sulfa Antibiotics Rash    Level of Care/Admitting Diagnosis ED Disposition     ED Disposition  Admit   Condition  --   Comment  Hospital Area: [facility] [facility] [[id number]]  Level of Care: Med-Surg [16]  May admit patient to [facility] or [facility] if equivalent level of care is available:: No  Covid Evaluation: Asymptomatic - no recent exposure (last 10 days) testing not required  Diagnosis: Weakness of right upper extremity [id number]  Admitting Physician: Aldine Contes [id number]  Attending Physician: Aldine Contes [id number]  Certification:: I certify this patient will need inpatient services for at least 2 midnights  Estimated Length of Stay: 4          B Medical/Surgery History Past Medical History:  Diagnosis Date   Anxiety    Arthritis    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[place]) 03/14/2013   Atypical chest pain 12/28/2013   Chest pain 07/30/2011   1999 Cath - <50% stenosis 2008 NUC - low risk     Chronic back pain    herniated disc   Coronary artery disease    takes Plavix and ASA daily   Depression    takes Prozac daily   Diabetes mellitus type 2, controlled, with complications ([place]) [id number]   Diverticulitis    hx of   Dizziness    occasionally and states Dr.Smith is aware   DJD (degenerative joint disease)    Essential hypertension, benign 07/30/2011   GERD (gastroesophageal reflux disease)    takes an OTC antacid   Gout    yrs ago and doesn't take any meds   Hard of hearing    wears hearing aids   Headache(784.0)    occasionally   History of colon polyps    benign   History of kidney stones    HNP (herniated nucleus pulposus), lumbar 06/14/2014   Hyperlipidemia    takes Atorvastatin daily   Hypertension    takes Amlodipine,Losartan,and Coreg daily   Hypothyroidism    takes Synthroid daily   IBS (irritable bowel syndrome)    Obesity, unspecified 07/30/2011   Shortness of breath dyspnea    with exertion   Tuberculosis    Patient denies Tb   Type II diabetes mellitus ([place])    takes Metformin daily   Unstable angina ([facility]) 12/27/2013   Weakness    numbness and tingling in left leg   Past Surgical History:  Procedure Laterality Date   ANTERIOR  CERVICAL DECOMP/DISCECTOMY FUSION  04/2007   C4-5; C6-7   BACK SURGERY     "3 times; last time was screws in lower back 10/2008"   CHOLECYSTECTOMY  1970's   COLONOSCOPY     CORONARY ANGIOPLASTY  1999/2013   1 stent   CYSTOSCOPY     LEFT HEART CATH AND CORONARY ANGIOGRAPHY N/A 09/30/2019   Procedure: LEFT HEART CATH AND CORONARY ANGIOGRAPHY;  Surgeon: Jettie Booze, MD;  Location: [facility];  Service: Cardiovascular;  Laterality: N/A;   LEFT HEART CATHETERIZATION WITH CORONARY ANGIOGRAM N/A 08/21/2011   Procedure: LEFT HEART CATHETERIZATION WITH CORONARY ANGIOGRAM;  Surgeon: Sinclair Grooms, MD;  Location: [facility] CATH LAB;  Service: Cardiovascular;  Laterality: N/A;   LITHOTRIPSY     LUMBAR  LAMINECTOMY/DECOMPRESSION MICRODISCECTOMY Left 06/14/2014   Procedure: LEFT LUMBAR THREE-FOUR LANINOTOMY AND MICRODISKECTOMY.;  Surgeon: Hosie Spangle, MD;  Location: [facility];  Service: Neurosurgery;  Laterality: Left;  left   PCI  5/09/2-13   LAD   RADIOLOGY WITH ANESTHESIA N/A 11/13/2015   Procedure: MRI OF LUMBAR SPINE W/WO CONTRAST    (RADIOLOGY WITH ANESTHESIA);  Surgeon: Medication Radiologist, MD;  Location: [facility];  Service: Radiology;  Laterality: N/A;   SHOULDER ARTHROSCOPY W/ ROTATOR CUFF REPAIR  ~ 2011   left     A IV Location/Drains/Wounds Patient Lines/Drains/Airways Status     Active Line/Drains/Airways     Name Placement date Placement time Site Days   Peripheral IV 06/04/22 20 G Left Antecubital 06/04/22  0747  Antecubital  less than 1            Intake/Output Last 24 hours No intake or output data in the 24 hours ending 06/04/22 1345  Labs/Imaging Results for orders placed or performed during the hospital encounter of 06/04/22 (from the past 48 hour(s))  CBG monitoring, ED     Status: Abnormal   Collection Time: 06/04/22  7:51 AM  Result Value Ref Range   Glucose-Capillary 163 (H) 70 - 99 mg/dL    Comment: Glucose reference range applies only to samples taken after fasting for at least 8 hours.  Ethanol     Status: None   Collection Time: 06/04/22  7:54 AM  Result Value Ref Range   Alcohol, Ethyl (B) <10 <10 mg/dL    Comment: (NOTE) Lowest detectable limit for serum alcohol is 10 mg/dL.  For medical purposes only. Performed at [facility], [place] [address] [place], [place] [address]   Protime-INR     Status: Abnormal   Collection Time: 06/04/22  7:54 AM  Result Value Ref Range   Prothrombin Time 15.9 (H) 11.4 - 15.2 seconds   INR 1.3 (H) 0.8 - 1.2    Comment: (NOTE) INR goal varies based on device and disease states. Performed at [facility], [place] [address] [place], [place] [address]   APTT     Status: None   Collection  Time: 06/04/22  7:54 AM  Result Value Ref Range   aPTT 29 24 - 36 seconds    Comment: Performed at [facility] [address] [place] [address]  CBC     Status: Abnormal   Collection Time: 06/04/22  7:54 AM  Result Value Ref Range   WBC 16.6 (H) 4.0 - 10.5 K/uL   RBC 3.98 (L) 4.22 - 5.81 MIL/uL   Hemoglobin 12.5 (L) 13.0 - 17.0 g/dL   HCT 36.0 (L) 39.0 - 52.0 %  MCV 90.5 80.0 - 100.0 fL   MCH 31.4 26.0 - 34.0 pg   MCHC 34.7 30.0 - 36.0 g/dL   RDW 13.1 11.5 - 15.5 %   Platelets 145 (L) 150 - 400 K/uL   nRBC 0.0 0.0 - 0.2 %    Comment: Performed at [facility] [address], [place], [place] [address]  Differential     Status: Abnormal   Collection Time: 06/04/22  7:54 AM  Result Value Ref Range   Neutrophils Relative % 88 %   Neutro Abs 14.6 (H) 1.7 - 7.7 K/uL   Lymphocytes Relative 4 %   Lymphs Abs 0.7 0.7 - 4.0 K/uL   Monocytes Relative 7 %   Monocytes Absolute 1.1 (H) 0.1 - 1.0 K/uL   Eosinophils Relative 0 %   Eosinophils Absolute 0.0 0.0 - 0.5 K/uL   Basophils Relative 0 %   Basophils Absolute 0.0 0.0 - 0.1 K/uL   Immature Granulocytes 1 %   Abs Immature Granulocytes 0.09 (H) 0.00 - 0.07 K/uL    Comment: Performed at [place] [address] [place], [place] [address]  Comprehensive metabolic panel     Status: Abnormal   Collection Time: 06/04/22  7:54 AM  Result Value Ref Range   Sodium 133 (L) 135 - 145 mmol/L   Potassium 3.9 3.5 - 5.1 mmol/L   Chloride 100 98 - 111 mmol/L   CO2 20 (L) 22 - 32 mmol/L   Glucose, Bld 176 (H) 70 - 99 mg/dL    Comment: Glucose reference range applies only to samples taken after fasting for at least 8 hours.   BUN 25 (H) 8 - 23 mg/dL   Creatinine, Ser 0.94 0.61 - 1.24 mg/dL   Calcium 8.9 8.9 - 10.3 mg/dL   Total Protein 6.3 (L) 6.5 - 8.1 g/dL   Albumin 3.3 (L) 3.5 - 5.0 g/dL   AST 92 (H) 15 - 41 U/L   ALT 35 0 - 44 U/L   Alkaline Phosphatase 45 38 - 126 U/L   Total Bilirubin 1.5 (H) 0.3 - 1.2  mg/dL   GFR, Estimated >60 >60 mL/min    Comment: (NOTE) Calculated using the CKD-EPI Creatinine Equation (2021)    Anion gap 13 5 - 15    Comment: Performed at [facility], [place] [address] [place], [place] [address]  I-stat chem 8, ED     Status: Abnormal   Collection Time: 06/04/22  8:00 AM  Result Value Ref Range   Sodium 136 135 - 145 mmol/L   Potassium 3.8 3.5 - 5.1 mmol/L   Chloride 104 98 - 111 mmol/L   BUN 29 (H) 8 - 23 mg/dL   Creatinine, Ser 0.80 0.61 - 1.24 mg/dL   Glucose, Bld 177 (H) 70 - 99 mg/dL    Comment: Glucose reference range applies only to samples taken after fasting for at least 8 hours.   Calcium, Ion 1.09 (L) 1.15 - 1.40 mmol/L   TCO2 21 (L) 22 - 32 mmol/L   Hemoglobin 11.9 (L) 13.0 - 17.0 g/dL   HCT 35.0 (L) 39.0 - 52.0 %  Resp panel by RT-PCR (RSV, Flu A&B, Covid) Urine, Clean Catch     Status: None   Collection Time: 06/04/22  8:46 AM   Specimen: Urine, Clean Catch; Nasal Swab  Result Value Ref Range   SARS Coronavirus 2 by RT PCR NEGATIVE NEGATIVE   Influenza A by PCR NEGATIVE NEGATIVE  Influenza B by PCR NEGATIVE NEGATIVE    Comment: (NOTE) The Xpert Xpress SARS-CoV-2/FLU/RSV plus assay is intended as an aid in the diagnosis of influenza from Nasopharyngeal swab specimens and should not be used as a sole basis for treatment. Nasal washings and aspirates are unacceptable for Xpert Xpress SARS-CoV-2/FLU/RSV testing.  Fact Sheet for Patients: EntrepreneurPulse.com.au  Fact Sheet for Healthcare Providers: IncredibleEmployment.be  This test is not yet approved or cleared by the [place] FDA and has been authorized for detection and/or diagnosis of SARS-CoV-2 by FDA under an Emergency Use Authorization (EUA). This EUA will remain in effect (meaning this test can be used) for the duration of the COVID-19 declaration under Section 564(b)(1) of the Act, 21 U.S.C. section 360bbb-3(b)(1), unless the  authorization is terminated or revoked.     Resp Syncytial Virus by PCR NEGATIVE NEGATIVE    Comment: (NOTE) Fact Sheet for Patients: EntrepreneurPulse.com.au  Fact Sheet for Healthcare Providers: IncredibleEmployment.be  This test is not yet approved or cleared by the [place] FDA and has been authorized for detection and/or diagnosis of SARS-CoV-2 by FDA under an Emergency Use Authorization (EUA). This EUA will remain in effect (meaning this test can be used) for the duration of the COVID-19 declaration under Section 564(b)(1) of the Act, 21 U.S.C. section 360bbb-3(b)(1), unless the authorization is terminated or revoked.  Performed at [facility], [place] [address], [place], [place] [address]   Urine rapid drug screen (hosp performed)     Status: None   Collection Time: 06/04/22  9:09 AM  Result Value Ref Range   Opiates NONE DETECTED NONE DETECTED   Cocaine NONE DETECTED NONE DETECTED   Benzodiazepines NONE DETECTED NONE DETECTED   Amphetamines NONE DETECTED NONE DETECTED   Tetrahydrocannabinol NONE DETECTED NONE DETECTED   Barbiturates NONE DETECTED NONE DETECTED    Comment: (NOTE) DRUG SCREEN FOR MEDICAL PURPOSES ONLY.  IF CONFIRMATION IS NEEDED FOR ANY PURPOSE, NOTIFY LAB WITHIN 5 DAYS.  LOWEST DETECTABLE LIMITS FOR URINE DRUG SCREEN Drug Class                     Cutoff (ng/mL) Amphetamine and metabolites    1000 Barbiturate and metabolites    200 Benzodiazepine                 200 Opiates and metabolites        300 Cocaine and metabolites        300 THC                            50 Performed at [facility], [place] [address], [place], [place] [address]   Urinalysis, Routine w reflex microscopic -Urine, Clean Catch     Status: Abnormal   Collection Time: 06/04/22  9:09 AM  Result Value Ref Range   Color, Urine YELLOW YELLOW   APPearance CLOUDY (A) CLEAR   Specific Gravity, Urine 1.016 1.005 - 1.030   pH  5.0 5.0 - 8.0   Glucose, UA NEGATIVE NEGATIVE mg/dL   Hgb urine dipstick LARGE (A) NEGATIVE   Bilirubin Urine NEGATIVE NEGATIVE   Ketones, ur 5 (A) NEGATIVE mg/dL   Protein, ur 30 (A) NEGATIVE mg/dL   Nitrite NEGATIVE NEGATIVE   Leukocytes,Ua LARGE (A) NEGATIVE   RBC / HPF 6-10 0 - 5 RBC/hpf   WBC, UA >50 0 - 5 WBC/hpf   Bacteria, UA MANY (A) NONE SEEN  Squamous Epithelial / HPF 0-5 0 - 5 /HPF    Comment: Performed at [facility], [place] [address] [place], [place] [address]   DG Chest Portable 1 View  Result Date: 06/04/2022 CLINICAL DATA:  Chills, weakness, possible stroke, fell yesterday, RIGHT arm weakness, history coronary artery disease, hypertension, diabetes mellitus, former smoker EXAM: PORTABLE CHEST 1 VIEW COMPARISON:  Portable exam 0854 hours compared to 11/08/2015 FINDINGS: Upper normal size of cardiac silhouette. Mediastinal contours and pulmonary vascularity normal. Lungs clear. No infiltrate, pleural effusion or pneumothorax. Prior cervical spine fusion and chronic LEFT rotator cuff tear noted. IMPRESSION: No acute abnormalities. Electronically Signed   By: Lavonia Dana M.D.   On: 06/04/2022 09:07   CT Cervical Spine Wo Contrast  Result Date: 06/04/2022 CLINICAL DATA:  Back pain after a fall EXAM: CT CERVICAL, THORACIC, AND LUMBAR SPINE WITHOUT CONTRAST TECHNIQUE: Multidetector CT imaging of the cervical, thoracic and lumbar spine was performed without intravenous contrast. Multiplanar CT image reconstructions were also generated. RADIATION DOSE REDUCTION: This exam was performed according to the departmental dose-optimization program which includes automated exposure control, adjustment of the mA and/or kV according to patient size and/or use of iterative reconstruction technique. COMPARISON:  Cervical spine CT 11/14/2017, CT abdomen/pelvis 12/07/2016, lumbar spine MRI 11/13/2015, two-view chest radiograph 11/08/2015 FINDINGS: CT CERVICAL SPINE FINDINGS Alignment: Normal.  There is no antero or retrolisthesis. There is no jumped or perched facet or other evidence of traumatic malalignment. Skull base and vertebrae: Skull base alignment is maintained. Vertebral body heights are preserved. There is no evidence of acute fracture. There is no suspicious osseous lesion postsurgical changes reflecting C5 through C7 ACDF are noted. Hardware alignment is within expected limits, without evidence of complication. There is solid fusion across the disc spaces. Soft tissues and spinal canal: No prevertebral fluid or swelling. No visible canal hematoma. Disc levels: There is mild degenerative change at the C1-C2 articulation with degenerative pannus about the dens. There is no evidence of significant narrowing of the craniocervical junction. There is multilevel facet arthropathy most advanced on the left at C2-C3 and C3-C4. There is no evidence of high-grade spinal canal or neural foraminal stenosis. CT THORACIC SPINE FINDINGS Alignment: Normal. There is no jumped or perched facet or other evidence of traumatic malalignment. Vertebrae: Vertebral body heights are preserved. There is no evidence of acute fracture. There is no suspicious osseous lesion. Paraspinal and other soft tissues: Unremarkable. Disc levels: There is multilevel degenerative endplate change with flowing anterior osteophytes in the thoracic spine consistent with diffuse idiopathic skeletal hyperostosis. There is overall mild multilevel facet arthropathy. There is no evidence of significant spinal canal or neural foraminal stenosis. CT LUMBAR SPINE FINDINGS Segmentation: Standard; the lowest formed disc space is designated L5-S1. Alignment: Normal. There is no jumped or perched facet or other evidence of traumatic malalignment. Vertebrae: Vertebral body heights are preserved. There is no evidence of acute fracture. There is no suspicious osseous lesion. Postsurgical changes reflecting posterior instrumented fusion and decompression  there is solid fusion across the disc spaces and posterior elements. From L3 through S1 are noted. There is no evidence of hardware related complication. Paraspinal and other soft tissues: There is a 0.8 cm nonobstructing right renal stone. A left upper pole renal cyst is noted requiring no specific imaging follow-up. Disc levels: T12-L1: There is a partially calcified disc protrusion resulting mild spinal canal stenosis. No significant neural foraminal stenosis L1-L2: No significant spinal canal or neural foraminal stenosis L2-L3: There is a  disc bulge, degenerative endplate spurring, and mild facet arthropathy resulting in severe spinal canal stenosis and mild left and no significant right neural foraminal stenosis L3-L4: There is a disc bulge, degenerative endplate spurring, and moderate bilateral facet arthropathy resulting severe spinal canal stenosis and moderate to severe bilateral neural foraminal stenosis L4-L5: Status post posterior instrumented fusion and decompression without convincing evidence of significant spinal canal or neural foraminal stenosis L5-S1: Status post posterior instrumented fusion and decompression. Endplate spurring resulting in mild-to-moderate bilateral neural foraminal stenosis. No evidence of significant spinal canal stenosis IMPRESSION: 1. No evidence of acute fracture or traumatic malalignment of the cervical, thoracic, or lumbar spine. 2. Postsurgical changes in the cervical and lumbar spine as above without evidence of complication. 3. Multifactorial severe spinal canal stenosis at L2-L3 and L3-L4 and moderate to severe bilateral neural foraminal stenosis at L3-L4. 4. 0.8 cm nonobstructing right renal stone. Electronically Signed   By: Valetta Mole M.D.   On: 06/04/2022 08:34   CT Lumbar Spine Wo Contrast  Result Date: 06/04/2022 CLINICAL DATA:  Back pain after a fall EXAM: CT CERVICAL, THORACIC, AND LUMBAR SPINE WITHOUT CONTRAST TECHNIQUE: Multidetector CT imaging of the  cervical, thoracic and lumbar spine was performed without intravenous contrast. Multiplanar CT image reconstructions were also generated. RADIATION DOSE REDUCTION: This exam was performed according to the departmental dose-optimization program which includes automated exposure control, adjustment of the mA and/or kV according to patient size and/or use of iterative reconstruction technique. COMPARISON:  Cervical spine CT 11/14/2017, CT abdomen/pelvis 12/07/2016, lumbar spine MRI 11/13/2015, two-view chest radiograph 11/08/2015 FINDINGS: CT CERVICAL SPINE FINDINGS Alignment: Normal. There is no antero or retrolisthesis. There is no jumped or perched facet or other evidence of traumatic malalignment. Skull base and vertebrae: Skull base alignment is maintained. Vertebral body heights are preserved. There is no evidence of acute fracture. There is no suspicious osseous lesion postsurgical changes reflecting C5 through C7 ACDF are noted. Hardware alignment is within expected limits, without evidence of complication. There is solid fusion across the disc spaces. Soft tissues and spinal canal: No prevertebral fluid or swelling. No visible canal hematoma. Disc levels: There is mild degenerative change at the C1-C2 articulation with degenerative pannus about the dens. There is no evidence of significant narrowing of the craniocervical junction. There is multilevel facet arthropathy most advanced on the left at C2-C3 and C3-C4. There is no evidence of high-grade spinal canal or neural foraminal stenosis. CT THORACIC SPINE FINDINGS Alignment: Normal. There is no jumped or perched facet or other evidence of traumatic malalignment. Vertebrae: Vertebral body heights are preserved. There is no evidence of acute fracture. There is no suspicious osseous lesion. Paraspinal and other soft tissues: Unremarkable. Disc levels: There is multilevel degenerative endplate change with flowing anterior osteophytes in the thoracic spine  consistent with diffuse idiopathic skeletal hyperostosis. There is overall mild multilevel facet arthropathy. There is no evidence of significant spinal canal or neural foraminal stenosis. CT LUMBAR SPINE FINDINGS Segmentation: Standard; the lowest formed disc space is designated L5-S1. Alignment: Normal. There is no jumped or perched facet or other evidence of traumatic malalignment. Vertebrae: Vertebral body heights are preserved. There is no evidence of acute fracture. There is no suspicious osseous lesion. Postsurgical changes reflecting posterior instrumented fusion and decompression there is solid fusion across the disc spaces and posterior elements. From L3 through S1 are noted. There is no evidence of hardware related complication. Paraspinal and other soft tissues: There is a 0.8 cm nonobstructing right renal stone.  A left upper pole renal cyst is noted requiring no specific imaging follow-up. Disc levels: T12-L1: There is a partially calcified disc protrusion resulting mild spinal canal stenosis. No significant neural foraminal stenosis L1-L2: No significant spinal canal or neural foraminal stenosis L2-L3: There is a disc bulge, degenerative endplate spurring, and mild facet arthropathy resulting in severe spinal canal stenosis and mild left and no significant right neural foraminal stenosis L3-L4: There is a disc bulge, degenerative endplate spurring, and moderate bilateral facet arthropathy resulting severe spinal canal stenosis and moderate to severe bilateral neural foraminal stenosis L4-L5: Status post posterior instrumented fusion and decompression without convincing evidence of significant spinal canal or neural foraminal stenosis L5-S1: Status post posterior instrumented fusion and decompression. Endplate spurring resulting in mild-to-moderate bilateral neural foraminal stenosis. No evidence of significant spinal canal stenosis IMPRESSION: 1. No evidence of acute fracture or traumatic malalignment  of the cervical, thoracic, or lumbar spine. 2. Postsurgical changes in the cervical and lumbar spine as above without evidence of complication. 3. Multifactorial severe spinal canal stenosis at L2-L3 and L3-L4 and moderate to severe bilateral neural foraminal stenosis at L3-L4. 4. 0.8 cm nonobstructing right renal stone. Electronically Signed   By: Valetta Mole M.D.   On: 06/04/2022 08:34   CT Thoracic Spine Wo Contrast  Result Date: 06/04/2022 CLINICAL DATA:  Back pain after a fall EXAM: CT CERVICAL, THORACIC, AND LUMBAR SPINE WITHOUT CONTRAST TECHNIQUE: Multidetector CT imaging of the cervical, thoracic and lumbar spine was performed without intravenous contrast. Multiplanar CT image reconstructions were also generated. RADIATION DOSE REDUCTION: This exam was performed according to the departmental dose-optimization program which includes automated exposure control, adjustment of the mA and/or kV according to patient size and/or use of iterative reconstruction technique. COMPARISON:  Cervical spine CT 11/14/2017, CT abdomen/pelvis 12/07/2016, lumbar spine MRI 11/13/2015, two-view chest radiograph 11/08/2015 FINDINGS: CT CERVICAL SPINE FINDINGS Alignment: Normal. There is no antero or retrolisthesis. There is no jumped or perched facet or other evidence of traumatic malalignment. Skull base and vertebrae: Skull base alignment is maintained. Vertebral body heights are preserved. There is no evidence of acute fracture. There is no suspicious osseous lesion postsurgical changes reflecting C5 through C7 ACDF are noted. Hardware alignment is within expected limits, without evidence of complication. There is solid fusion across the disc spaces. Soft tissues and spinal canal: No prevertebral fluid or swelling. No visible canal hematoma. Disc levels: There is mild degenerative change at the C1-C2 articulation with degenerative pannus about the dens. There is no evidence of significant narrowing of the craniocervical  junction. There is multilevel facet arthropathy most advanced on the left at C2-C3 and C3-C4. There is no evidence of high-grade spinal canal or neural foraminal stenosis. CT THORACIC SPINE FINDINGS Alignment: Normal. There is no jumped or perched facet or other evidence of traumatic malalignment. Vertebrae: Vertebral body heights are preserved. There is no evidence of acute fracture. There is no suspicious osseous lesion. Paraspinal and other soft tissues: Unremarkable. Disc levels: There is multilevel degenerative endplate change with flowing anterior osteophytes in the thoracic spine consistent with diffuse idiopathic skeletal hyperostosis. There is overall mild multilevel facet arthropathy. There is no evidence of significant spinal canal or neural foraminal stenosis. CT LUMBAR SPINE FINDINGS Segmentation: Standard; the lowest formed disc space is designated L5-S1. Alignment: Normal. There is no jumped or perched facet or other evidence of traumatic malalignment. Vertebrae: Vertebral body heights are preserved. There is no evidence of acute fracture. There is no suspicious osseous lesion. Postsurgical  changes reflecting posterior instrumented fusion and decompression there is solid fusion across the disc spaces and posterior elements. From L3 through S1 are noted. There is no evidence of hardware related complication. Paraspinal and other soft tissues: There is a 0.8 cm nonobstructing right renal stone. A left upper pole renal cyst is noted requiring no specific imaging follow-up. Disc levels: T12-L1: There is a partially calcified disc protrusion resulting mild spinal canal stenosis. No significant neural foraminal stenosis L1-L2: No significant spinal canal or neural foraminal stenosis L2-L3: There is a disc bulge, degenerative endplate spurring, and mild facet arthropathy resulting in severe spinal canal stenosis and mild left and no significant right neural foraminal stenosis L3-L4: There is a disc bulge,  degenerative endplate spurring, and moderate bilateral facet arthropathy resulting severe spinal canal stenosis and moderate to severe bilateral neural foraminal stenosis L4-L5: Status post posterior instrumented fusion and decompression without convincing evidence of significant spinal canal or neural foraminal stenosis L5-S1: Status post posterior instrumented fusion and decompression. Endplate spurring resulting in mild-to-moderate bilateral neural foraminal stenosis. No evidence of significant spinal canal stenosis IMPRESSION: 1. No evidence of acute fracture or traumatic malalignment of the cervical, thoracic, or lumbar spine. 2. Postsurgical changes in the cervical and lumbar spine as above without evidence of complication. 3. Multifactorial severe spinal canal stenosis at L2-L3 and L3-L4 and moderate to severe bilateral neural foraminal stenosis at L3-L4. 4. 0.8 cm nonobstructing right renal stone. Electronically Signed   By: Valetta Mole M.D.   On: 06/04/2022 08:34   CT HEAD CODE STROKE WO CONTRAST  Result Date: 06/04/2022 CLINICAL DATA:  Code stroke. EXAM: CT HEAD WITHOUT CONTRAST TECHNIQUE: Contiguous axial images were obtained from the base of the skull through the vertex without intravenous contrast. RADIATION DOSE REDUCTION: This exam was performed according to the departmental dose-optimization program which includes automated exposure control, adjustment of the mA and/or kV according to patient size and/or use of iterative reconstruction technique. COMPARISON:  CT head 11/14/2017 FINDINGS: Brain: There is no acute intracranial hemorrhage, extra-axial fluid collection, or acute infarct. Parenchymal volume is normal for age. The ventricles are normal in size. Gray-white differentiation is preserved patchy hypodensity in the supratentorial white matter likely reflects sequela of chronic small-vessel ischemic change. The pituitary and suprasellar region are normal. There is no mass lesion. There is no  mass effect or midline shift. Vascular: There is calcification of the bilateral carotid siphons and vertebral arteries. No hyperdense vessel is seen. Skull: Choose Sinuses/Orbits: The paranasal sinuses are clear. Bilateral lens implants are in place. The globes and orbits are otherwise unremarkable. Other: None. ASPECTS [facility] Stroke Program Early CT Score) - Ganglionic level infarction (caudate, lentiform nuclei, internal capsule, insula, M1-M3 cortex): 7 - Supraganglionic infarction (M4-M6 cortex): 3 Total score (0-10 with 10 being normal): 10 IMPRESSION: No acute intracranial pathology. Findings communicated to Dr Curly Shores via Shea Evans at 8:11 am. Electronically Signed   By: Valetta Mole M.D.   On: 06/04/2022 08:12    Pending Labs Unresulted Labs (From admission, onward)     Start     Ordered   06/05/22 0500  Hemoglobin A1c  Tomorrow morning,   R        06/04/22 1000   06/05/22 0500  Lipid panel  Tomorrow morning,   R        06/04/22 1000   06/04/22 1141  CK  Once,   URGENT        06/04/22 1140   06/04/22 1030  Urine Culture (  for pregnant, neutropenic or urologic patients or patients with an indwelling urinary catheter)  (Urine Labs)  Once,   URGENT       Question:  Indication  Answer:  Altered mental status (if no other cause identified)   06/04/22 1029            Vitals/Pain Today's Vitals   06/04/22 1145 06/04/22 1200 06/04/22 1213 06/04/22 1315  BP: (!) 148/73     Pulse: 85 88    Resp: 16 (!) 25    Temp:   99.4 F (37.4 C) 99.6 F (37.6 C)  TempSrc:   Oral Oral  SpO2: 98% 99%    Weight:      Height:      PainSc:        Isolation Precautions Airborne and Contact precautions  Medications Medications  enoxaparin (LOVENOX) injection 60 mg (has no administration in time range)  acetaminophen (TYLENOL) tablet 650 mg (has no administration in time range)  cefTRIAXone (ROCEPHIN) 1 g in sodium chloride 0.9 % 100 mL IVPB (0 g Intravenous Stopped 06/04/22 1151)     Mobility walks with person assist     Focused Assessments Cardiac Assessment Handoff:    Lab Results  Component Value Date   CKTOTAL 52 09/01/2011   CKMB 2.4 09/01/2011   TROPONINI <0.30 12/28/2013   No results found for: "DDIMER" Does the Patient currently have chest pain? No   , Neuro Assessment Handoff:  Swallow screen pass? Yes    NIH Stroke Scale  Dizziness Present: No Headache Present: No Interval: Other (Comment) (q2 NIH) Level of Consciousness (1a.)   : Alert, keenly responsive LOC Questions (1b. )   : Answers both questions correctly LOC Commands (1c. )   : Performs both tasks correctly Best Gaze (2. )  : Normal Visual (3. )  : No visual loss Facial Palsy (4. )    : Normal symmetrical movements Motor Arm, Left (5a. )   : Drift Motor Arm, Right (5b. ) : Some effort against gravity Motor Leg, Left (6a. )  : No drift Motor Leg, Right (6b. ) : No drift Limb Ataxia (7. ): Absent Sensory (8. )  : Mild-to-moderate sensory loss, patient feels pinprick is less sharp or is dull on the affected side, or there is a loss of superficial pain with pinprick, but patient is aware of being touched Best Language (9. )  : No aphasia Dysarthria (10. ): Normal Extinction/Inattention (11.)   : No Abnormality Complete NIHSS TOTAL: 4 Last date known well: 06/03/22 Last time known well: 1900 Neuro Assessment:   Neuro Checks:   Initial (06/04/22 0830)  Has TPA been given? No If patient is a Neuro Trauma and patient is going to OR before floor call report to [place] nurse: [phone] or [phone]   R Recommendations: See Admitting Provider Note  Report given to:   Additional Notes: NA

## 2022-06-04 NOTE — Progress Notes (Signed)
Enoxaparin for prophylaxis against VTE (DVT/PE) has been dose-adjusted based upon the patient's BMI > 30. Patient weighs 114kg x 0.34m/kg = 57 milligrams. Will round to commercially available strength syringe of 64msyringe. Administer every 24 hours.

## 2022-06-04 NOTE — Code Documentation (Signed)
[name] is a 77 yr old male arriving to [facility] via EMS on 06/04/2022 with a past medical history of HTN, CAD, DM, Chronic back pain, GERD, IBS. He takes aspirin and plavix. Pt is from home where he was last known well 06/03/22 at 1900. He fell and landed on his shoulder overnight, and has not been able to move his rt arm since 0300.    Stroke team at the bridge on pt arrival. Labs , CBG obtained, pt cleared for CT by EDP. Pt to CT with the team. NIHSS 10. (Please see documentation for times and NIHSS details). Pt with rt arm weakness, left leg weakness, rt sensory loss, dysarthria, and rt facial droop. CTNC performed. Per Dr. Curly Shores, CT is negative for acute hemorrhage.     Pt returned to room 25 where his workup will continue. Pt is not a candidate for thrombolytic as he is OOW. He is ineligible for Thrombectomy due to no LVO symptoms. He will need q 2 hr VS and NIHSS for 12 hours, then q 4. Stroke swallow screen prior to pos. Bedside handoff with [place] RN complete.  [place] RN Stroke Response [phone]

## 2022-06-04 NOTE — ED Notes (Signed)
ED TO INPATIENT HANDOFF REPORT  ED Nurse Name and Phone #:   S Name/Age/Gender [name] Daidone 77 y.o. male Room/Bed: 025C/025C  Code Status   Code Status: Full Code  Home/SNF/Other Home Patient oriented to: self, place, time, and situation Is this baseline? Yes   Triage Complete: Triage complete  Chief Complaint Right sided weakness [R53.1] Weakness of right upper extremity [R29.898]  Triage Note Pt to ED via EMS from home as a possible code stroke. Pt had a fall yesterday and LKW was 06/03/22 @1900$ . EMS reports symptoms of right side arm weakness. Upon arrival to ED pt is Aox4, speech clear with bruising noted to left eye. Pt is not able to hold up his right arm, and c/o back pain. Pt denies any other injuries. Code stroke RN & neurologist at bridge and activated code stroke. Blood collected and pt transported to CT. BP 115/42 HR 86 98% on RA, BGL 163   Allergies Allergies  Allergen Reactions   Sulfa Antibiotics Rash    Level of Care/Admitting Diagnosis ED Disposition     ED Disposition  Admit   Condition  --   Comment  Hospital Area: [facility] [[id number]]  Level of Care: Med-Surg [16]  May admit patient to [facility] or [facility] if equivalent level of care is available:: No  Covid Evaluation: Asymptomatic - no recent exposure (last 10 days) testing not required  Diagnosis: Weakness of right upper extremity [id number]  Admitting Physician: Aldine Contes [id number]  Attending Physician: Aldine Contes [id number]  Certification:: I certify this patient will need inpatient services for at least 2 midnights  Estimated Length of Stay: 4          B Medical/Surgery History Past Medical History:  Diagnosis Date   Anxiety    Arthritis    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[place]) 03/14/2013   Atypical chest pain 12/28/2013   Chest pain 07/30/2011   1999 Cath - <50% stenosis 2008 NUC - low risk     Chronic back pain    herniated disc   Coronary artery disease    takes Plavix and ASA daily   Depression    takes Prozac daily   Diabetes mellitus type 2, controlled, with complications ([place]) [id number]   Diverticulitis    hx of   Dizziness    occasionally and states Dr.Smith is aware   DJD (degenerative joint disease)    Essential hypertension, benign 07/30/2011   GERD (gastroesophageal reflux disease)    takes an OTC antacid   Gout    yrs ago and doesn't take any meds   Hard of hearing    wears hearing aids   Headache(784.0)    occasionally   History of colon polyps    benign   History of kidney stones    HNP (herniated nucleus pulposus), lumbar 06/14/2014   Hyperlipidemia    takes Atorvastatin daily   Hypertension    takes Amlodipine,Losartan,and Coreg daily   Hypothyroidism    takes Synthroid daily   IBS (irritable bowel syndrome)    Obesity, unspecified 07/30/2011   Shortness of breath dyspnea    with exertion   Tuberculosis    Patient denies Tb   Type II diabetes mellitus ([place])    takes Metformin daily   Unstable angina ([facility]) 12/27/2013   Weakness    numbness and tingling in left leg   Past Surgical History:  Procedure Laterality Date   ANTERIOR CERVICAL DECOMP/DISCECTOMY  FUSION  04/2007   C4-5; C6-7   BACK SURGERY     "3 times; last time was screws in lower back 10/2008"   CHOLECYSTECTOMY  1970's   COLONOSCOPY     CORONARY ANGIOPLASTY  1999/2013   1 stent   CYSTOSCOPY     LEFT HEART CATH AND CORONARY ANGIOGRAPHY N/A 09/30/2019   Procedure: LEFT HEART CATH AND CORONARY ANGIOGRAPHY;  Surgeon: Jettie Booze, MD;  Location: [facility];  Service: Cardiovascular;  Laterality: N/A;   LEFT HEART CATHETERIZATION WITH CORONARY ANGIOGRAM N/A 08/21/2011   Procedure: LEFT HEART CATHETERIZATION WITH CORONARY ANGIOGRAM;  Surgeon: Sinclair Grooms, MD;  Location: [facility] CATH LAB;  Service: Cardiovascular;  Laterality: N/A;   LITHOTRIPSY     LUMBAR  LAMINECTOMY/DECOMPRESSION MICRODISCECTOMY Left 06/14/2014   Procedure: LEFT LUMBAR THREE-FOUR LANINOTOMY AND MICRODISKECTOMY.;  Surgeon: Hosie Spangle, MD;  Location: [facility]URO ORS;  Service: Neurosurgery;  Laterality: Left;  left   PCI  5/09/2-13   LAD   RADIOLOGY WITH ANESTHESIA N/A 11/13/2015   Procedure: MRI OF LUMBAR SPINE W/WO CONTRAST    (RADIOLOGY WITH ANESTHESIA);  Surgeon: Medication Radiologist, MD;  Location: [facility];  Service: Radiology;  Laterality: N/A;   SHOULDER ARTHROSCOPY W/ ROTATOR CUFF REPAIR  ~ 2011   left     A IV Location/Drains/Wounds Patient Lines/Drains/Airways Status     Active Line/Drains/Airways     Name Placement date Placement time Site Days   Peripheral IV 06/04/22 20 G Left Antecubital 06/04/22  0747  Antecubital  less than 1            Intake/Output Last 24 hours No intake or output data in the 24 hours ending 06/04/22 1309  Labs/Imaging Results for orders placed or performed during the hospital encounter of 06/04/22 (from the past 48 hour(s))  CBG monitoring, ED     Status: Abnormal   Collection Time: 06/04/22  7:51 AM  Result Value Ref Range   Glucose-Capillary 163 (H) 70 - 99 mg/dL    Comment: Glucose reference range applies only to samples taken after fasting for at least 8 hours.  Ethanol     Status: None   Collection Time: 06/04/22  7:54 AM  Result Value Ref Range   Alcohol, Ethyl (B) <10 <10 mg/dL    Comment: (NOTE) Lowest detectable limit for serum alcohol is 10 mg/dL.  For medical purposes only. Performed at [facility], [place] [address], [place], [place] [address]   Protime-INR     Status: Abnormal   Collection Time: 06/04/22  7:54 AM  Result Value Ref Range   Prothrombin Time 15.9 (H) 11.4 - 15.2 seconds   INR 1.3 (H) 0.8 - 1.2    Comment: (NOTE) INR goal varies based on device and disease states. Performed at [facility], [place] [address] [place], [place] [address]   APTT     Status: None   Collection  Time: 06/04/22  7:54 AM  Result Value Ref Range   aPTT 29 24 - 36 seconds    Comment: Performed at [facility] [address] [place] [address]  CBC     Status: Abnormal   Collection Time: 06/04/22  7:54 AM  Result Value Ref Range   WBC 16.6 (H) 4.0 - 10.5 K/uL   RBC 3.98 (L) 4.22 - 5.81 MIL/uL   Hemoglobin 12.5 (L) 13.0 - 17.0 g/dL   HCT 36.0 (L) 39.0 - 52.0 %  MCV 90.5 80.0 - 100.0 fL   MCH 31.4 26.0 - 34.0 pg   MCHC 34.7 30.0 - 36.0 g/dL   RDW 13.1 11.5 - 15.5 %   Platelets 145 (L) 150 - 400 K/uL   nRBC 0.0 0.0 - 0.2 %    Comment: Performed at [facility] [address], [place], [place] [address]  Differential     Status: Abnormal   Collection Time: 06/04/22  7:54 AM  Result Value Ref Range   Neutrophils Relative % 88 %   Neutro Abs 14.6 (H) 1.7 - 7.7 K/uL   Lymphocytes Relative 4 %   Lymphs Abs 0.7 0.7 - 4.0 K/uL   Monocytes Relative 7 %   Monocytes Absolute 1.1 (H) 0.1 - 1.0 K/uL   Eosinophils Relative 0 %   Eosinophils Absolute 0.0 0.0 - 0.5 K/uL   Basophils Relative 0 %   Basophils Absolute 0.0 0.0 - 0.1 K/uL   Immature Granulocytes 1 %   Abs Immature Granulocytes 0.09 (H) 0.00 - 0.07 K/uL    Comment: Performed at [facility] [address], [place], [place] [address]  Comprehensive metabolic panel     Status: Abnormal   Collection Time: 06/04/22  7:54 AM  Result Value Ref Range   Sodium 133 (L) 135 - 145 mmol/L   Potassium 3.9 3.5 - 5.1 mmol/L   Chloride 100 98 - 111 mmol/L   CO2 20 (L) 22 - 32 mmol/L   Glucose, Bld 176 (H) 70 - 99 mg/dL    Comment: Glucose reference range applies only to samples taken after fasting for at least 8 hours.   BUN 25 (H) 8 - 23 mg/dL   Creatinine, Ser 0.94 0.61 - 1.24 mg/dL   Calcium 8.9 8.9 - 10.3 mg/dL   Total Protein 6.3 (L) 6.5 - 8.1 g/dL   Albumin 3.3 (L) 3.5 - 5.0 g/dL   AST 92 (H) 15 - 41 U/L   ALT 35 0 - 44 U/L   Alkaline Phosphatase 45 38 - 126 U/L   Total Bilirubin 1.5 (H) 0.3 - 1.2  mg/dL   GFR, Estimated >60 >60 mL/min    Comment: (NOTE) Calculated using the CKD-EPI Creatinine Equation (2021)    Anion gap 13 5 - 15    Comment: Performed at [facility], [place] [address], [place], [place] [address]  I-stat chem 8, ED     Status: Abnormal   Collection Time: 06/04/22  8:00 AM  Result Value Ref Range   Sodium 136 135 - 145 mmol/L   Potassium 3.8 3.5 - 5.1 mmol/L   Chloride 104 98 - 111 mmol/L   BUN 29 (H) 8 - 23 mg/dL   Creatinine, Ser 0.80 0.61 - 1.24 mg/dL   Glucose, Bld 177 (H) 70 - 99 mg/dL    Comment: Glucose reference range applies only to samples taken after fasting for at least 8 hours.   Calcium, Ion 1.09 (L) 1.15 - 1.40 mmol/L   TCO2 21 (L) 22 - 32 mmol/L   Hemoglobin 11.9 (L) 13.0 - 17.0 g/dL   HCT 35.0 (L) 39.0 - 52.0 %  Resp panel by RT-PCR (RSV, Flu A&B, Covid) Urine, Clean Catch     Status: None   Collection Time: 06/04/22  8:46 AM   Specimen: Urine, Clean Catch; Nasal Swab  Result Value Ref Range   SARS Coronavirus 2 by RT PCR NEGATIVE NEGATIVE   Influenza A by PCR NEGATIVE NEGATIVE  Influenza B by PCR NEGATIVE NEGATIVE    Comment: (NOTE) The Xpert Xpress SARS-CoV-2/FLU/RSV plus assay is intended as an aid in the diagnosis of influenza from Nasopharyngeal swab specimens and should not be used as a sole basis for treatment. Nasal washings and aspirates are unacceptable for Xpert Xpress SARS-CoV-2/FLU/RSV testing.  Fact Sheet for Patients: EntrepreneurPulse.com.au  Fact Sheet for Healthcare Providers: IncredibleEmployment.be  This test is not yet approved or cleared by the [place] FDA and has been authorized for detection and/or diagnosis of SARS-CoV-2 by FDA under an Emergency Use Authorization (EUA). This EUA will remain in effect (meaning this test can be used) for the duration of the COVID-19 declaration under Section 564(b)(1) of the Act, 21 U.S.C. section 360bbb-3(b)(1), unless the  authorization is terminated or revoked.     Resp Syncytial Virus by PCR NEGATIVE NEGATIVE    Comment: (NOTE) Fact Sheet for Patients: EntrepreneurPulse.com.au  Fact Sheet for Healthcare Providers: IncredibleEmployment.be  This test is not yet approved or cleared by the [place] FDA and has been authorized for detection and/or diagnosis of SARS-CoV-2 by FDA under an Emergency Use Authorization (EUA). This EUA will remain in effect (meaning this test can be used) for the duration of the COVID-19 declaration under Section 564(b)(1) of the Act, 21 U.S.C. section 360bbb-3(b)(1), unless the authorization is terminated or revoked.  Performed at [facility], [place] [address], [place], [place] [address]   Urine rapid drug screen (hosp performed)     Status: None   Collection Time: 06/04/22  9:09 AM  Result Value Ref Range   Opiates NONE DETECTED NONE DETECTED   Cocaine NONE DETECTED NONE DETECTED   Benzodiazepines NONE DETECTED NONE DETECTED   Amphetamines NONE DETECTED NONE DETECTED   Tetrahydrocannabinol NONE DETECTED NONE DETECTED   Barbiturates NONE DETECTED NONE DETECTED    Comment: (NOTE) DRUG SCREEN FOR MEDICAL PURPOSES ONLY.  IF CONFIRMATION IS NEEDED FOR ANY PURPOSE, NOTIFY LAB WITHIN 5 DAYS.  LOWEST DETECTABLE LIMITS FOR URINE DRUG SCREEN Drug Class                     Cutoff (ng/mL) Amphetamine and metabolites    1000 Barbiturate and metabolites    200 Benzodiazepine                 200 Opiates and metabolites        300 Cocaine and metabolites        300 THC                            50 Performed at [facility], [place] [address] [place], [place] [address]   Urinalysis, Routine w reflex microscopic -Urine, Clean Catch     Status: Abnormal   Collection Time: 06/04/22  9:09 AM  Result Value Ref Range   Color, Urine YELLOW YELLOW   APPearance CLOUDY (A) CLEAR   Specific Gravity, Urine 1.016 1.005 - 1.030   pH  5.0 5.0 - 8.0   Glucose, UA NEGATIVE NEGATIVE mg/dL   Hgb urine dipstick LARGE (A) NEGATIVE   Bilirubin Urine NEGATIVE NEGATIVE   Ketones, ur 5 (A) NEGATIVE mg/dL   Protein, ur 30 (A) NEGATIVE mg/dL   Nitrite NEGATIVE NEGATIVE   Leukocytes,Ua LARGE (A) NEGATIVE   RBC / HPF 6-10 0 - 5 RBC/hpf   WBC, UA >50 0 - 5 WBC/hpf   Bacteria, UA MANY (A) NONE SEEN  Squamous Epithelial / HPF 0-5 0 - 5 /HPF    Comment: Performed at [facility], [place] [address], [place], [place] [address]   DG Chest Portable 1 View  Result Date: 06/04/2022 CLINICAL DATA:  Chills, weakness, possible stroke, fell yesterday, RIGHT arm weakness, history coronary artery disease, hypertension, diabetes mellitus, former smoker EXAM: PORTABLE CHEST 1 VIEW COMPARISON:  Portable exam 0854 hours compared to 11/08/2015 FINDINGS: Upper normal size of cardiac silhouette. Mediastinal contours and pulmonary vascularity normal. Lungs clear. No infiltrate, pleural effusion or pneumothorax. Prior cervical spine fusion and chronic LEFT rotator cuff tear noted. IMPRESSION: No acute abnormalities. Electronically Signed   By: Lavonia Dana M.D.   On: 06/04/2022 09:07   CT Cervical Spine Wo Contrast  Result Date: 06/04/2022 CLINICAL DATA:  Back pain after a fall EXAM: CT CERVICAL, THORACIC, AND LUMBAR SPINE WITHOUT CONTRAST TECHNIQUE: Multidetector CT imaging of the cervical, thoracic and lumbar spine was performed without intravenous contrast. Multiplanar CT image reconstructions were also generated. RADIATION DOSE REDUCTION: This exam was performed according to the departmental dose-optimization program which includes automated exposure control, adjustment of the mA and/or kV according to patient size and/or use of iterative reconstruction technique. COMPARISON:  Cervical spine CT 11/14/2017, CT abdomen/pelvis 12/07/2016, lumbar spine MRI 11/13/2015, two-view chest radiograph 11/08/2015 FINDINGS: CT CERVICAL SPINE FINDINGS Alignment: Normal.  There is no antero or retrolisthesis. There is no jumped or perched facet or other evidence of traumatic malalignment. Skull base and vertebrae: Skull base alignment is maintained. Vertebral body heights are preserved. There is no evidence of acute fracture. There is no suspicious osseous lesion postsurgical changes reflecting C5 through C7 ACDF are noted. Hardware alignment is within expected limits, without evidence of complication. There is solid fusion across the disc spaces. Soft tissues and spinal canal: No prevertebral fluid or swelling. No visible canal hematoma. Disc levels: There is mild degenerative change at the C1-C2 articulation with degenerative pannus about the dens. There is no evidence of significant narrowing of the craniocervical junction. There is multilevel facet arthropathy most advanced on the left at C2-C3 and C3-C4. There is no evidence of high-grade spinal canal or neural foraminal stenosis. CT THORACIC SPINE FINDINGS Alignment: Normal. There is no jumped or perched facet or other evidence of traumatic malalignment. Vertebrae: Vertebral body heights are preserved. There is no evidence of acute fracture. There is no suspicious osseous lesion. Paraspinal and other soft tissues: Unremarkable. Disc levels: There is multilevel degenerative endplate change with flowing anterior osteophytes in the thoracic spine consistent with diffuse idiopathic skeletal hyperostosis. There is overall mild multilevel facet arthropathy. There is no evidence of significant spinal canal or neural foraminal stenosis. CT LUMBAR SPINE FINDINGS Segmentation: Standard; the lowest formed disc space is designated L5-S1. Alignment: Normal. There is no jumped or perched facet or other evidence of traumatic malalignment. Vertebrae: Vertebral body heights are preserved. There is no evidence of acute fracture. There is no suspicious osseous lesion. Postsurgical changes reflecting posterior instrumented fusion and decompression  there is solid fusion across the disc spaces and posterior elements. From L3 through S1 are noted. There is no evidence of hardware related complication. Paraspinal and other soft tissues: There is a 0.8 cm nonobstructing right renal stone. A left upper pole renal cyst is noted requiring no specific imaging follow-up. Disc levels: T12-L1: There is a partially calcified disc protrusion resulting mild spinal canal stenosis. No significant neural foraminal stenosis L1-L2: No significant spinal canal or neural foraminal stenosis L2-L3: There is a  disc bulge, degenerative endplate spurring, and mild facet arthropathy resulting in severe spinal canal stenosis and mild left and no significant right neural foraminal stenosis L3-L4: There is a disc bulge, degenerative endplate spurring, and moderate bilateral facet arthropathy resulting severe spinal canal stenosis and moderate to severe bilateral neural foraminal stenosis L4-L5: Status post posterior instrumented fusion and decompression without convincing evidence of significant spinal canal or neural foraminal stenosis L5-S1: Status post posterior instrumented fusion and decompression. Endplate spurring resulting in mild-to-moderate bilateral neural foraminal stenosis. No evidence of significant spinal canal stenosis IMPRESSION: 1. No evidence of acute fracture or traumatic malalignment of the cervical, thoracic, or lumbar spine. 2. Postsurgical changes in the cervical and lumbar spine as above without evidence of complication. 3. Multifactorial severe spinal canal stenosis at L2-L3 and L3-L4 and moderate to severe bilateral neural foraminal stenosis at L3-L4. 4. 0.8 cm nonobstructing right renal stone. Electronically Signed   By: Valetta Mole M.D.   On: 06/04/2022 08:34   CT Lumbar Spine Wo Contrast  Result Date: 06/04/2022 CLINICAL DATA:  Back pain after a fall EXAM: CT CERVICAL, THORACIC, AND LUMBAR SPINE WITHOUT CONTRAST TECHNIQUE: Multidetector CT imaging of the  cervical, thoracic and lumbar spine was performed without intravenous contrast. Multiplanar CT image reconstructions were also generated. RADIATION DOSE REDUCTION: This exam was performed according to the departmental dose-optimization program which includes automated exposure control, adjustment of the mA and/or kV according to patient size and/or use of iterative reconstruction technique. COMPARISON:  Cervical spine CT 11/14/2017, CT abdomen/pelvis 12/07/2016, lumbar spine MRI 11/13/2015, two-view chest radiograph 11/08/2015 FINDINGS: CT CERVICAL SPINE FINDINGS Alignment: Normal. There is no antero or retrolisthesis. There is no jumped or perched facet or other evidence of traumatic malalignment. Skull base and vertebrae: Skull base alignment is maintained. Vertebral body heights are preserved. There is no evidence of acute fracture. There is no suspicious osseous lesion postsurgical changes reflecting C5 through C7 ACDF are noted. Hardware alignment is within expected limits, without evidence of complication. There is solid fusion across the disc spaces. Soft tissues and spinal canal: No prevertebral fluid or swelling. No visible canal hematoma. Disc levels: There is mild degenerative change at the C1-C2 articulation with degenerative pannus about the dens. There is no evidence of significant narrowing of the craniocervical junction. There is multilevel facet arthropathy most advanced on the left at C2-C3 and C3-C4. There is no evidence of high-grade spinal canal or neural foraminal stenosis. CT THORACIC SPINE FINDINGS Alignment: Normal. There is no jumped or perched facet or other evidence of traumatic malalignment. Vertebrae: Vertebral body heights are preserved. There is no evidence of acute fracture. There is no suspicious osseous lesion. Paraspinal and other soft tissues: Unremarkable. Disc levels: There is multilevel degenerative endplate change with flowing anterior osteophytes in the thoracic spine  consistent with diffuse idiopathic skeletal hyperostosis. There is overall mild multilevel facet arthropathy. There is no evidence of significant spinal canal or neural foraminal stenosis. CT LUMBAR SPINE FINDINGS Segmentation: Standard; the lowest formed disc space is designated L5-S1. Alignment: Normal. There is no jumped or perched facet or other evidence of traumatic malalignment. Vertebrae: Vertebral body heights are preserved. There is no evidence of acute fracture. There is no suspicious osseous lesion. Postsurgical changes reflecting posterior instrumented fusion and decompression there is solid fusion across the disc spaces and posterior elements. From L3 through S1 are noted. There is no evidence of hardware related complication. Paraspinal and other soft tissues: There is a 0.8 cm nonobstructing right renal stone.  A left upper pole renal cyst is noted requiring no specific imaging follow-up. Disc levels: T12-L1: There is a partially calcified disc protrusion resulting mild spinal canal stenosis. No significant neural foraminal stenosis L1-L2: No significant spinal canal or neural foraminal stenosis L2-L3: There is a disc bulge, degenerative endplate spurring, and mild facet arthropathy resulting in severe spinal canal stenosis and mild left and no significant right neural foraminal stenosis L3-L4: There is a disc bulge, degenerative endplate spurring, and moderate bilateral facet arthropathy resulting severe spinal canal stenosis and moderate to severe bilateral neural foraminal stenosis L4-L5: Status post posterior instrumented fusion and decompression without convincing evidence of significant spinal canal or neural foraminal stenosis L5-S1: Status post posterior instrumented fusion and decompression. Endplate spurring resulting in mild-to-moderate bilateral neural foraminal stenosis. No evidence of significant spinal canal stenosis IMPRESSION: 1. No evidence of acute fracture or traumatic malalignment  of the cervical, thoracic, or lumbar spine. 2. Postsurgical changes in the cervical and lumbar spine as above without evidence of complication. 3. Multifactorial severe spinal canal stenosis at L2-L3 and L3-L4 and moderate to severe bilateral neural foraminal stenosis at L3-L4. 4. 0.8 cm nonobstructing right renal stone. Electronically Signed   By: Valetta Mole M.D.   On: 06/04/2022 08:34   CT Thoracic Spine Wo Contrast  Result Date: 06/04/2022 CLINICAL DATA:  Back pain after a fall EXAM: CT CERVICAL, THORACIC, AND LUMBAR SPINE WITHOUT CONTRAST TECHNIQUE: Multidetector CT imaging of the cervical, thoracic and lumbar spine was performed without intravenous contrast. Multiplanar CT image reconstructions were also generated. RADIATION DOSE REDUCTION: This exam was performed according to the departmental dose-optimization program which includes automated exposure control, adjustment of the mA and/or kV according to patient size and/or use of iterative reconstruction technique. COMPARISON:  Cervical spine CT 11/14/2017, CT abdomen/pelvis 12/07/2016, lumbar spine MRI 11/13/2015, two-view chest radiograph 11/08/2015 FINDINGS: CT CERVICAL SPINE FINDINGS Alignment: Normal. There is no antero or retrolisthesis. There is no jumped or perched facet or other evidence of traumatic malalignment. Skull base and vertebrae: Skull base alignment is maintained. Vertebral body heights are preserved. There is no evidence of acute fracture. There is no suspicious osseous lesion postsurgical changes reflecting C5 through C7 ACDF are noted. Hardware alignment is within expected limits, without evidence of complication. There is solid fusion across the disc spaces. Soft tissues and spinal canal: No prevertebral fluid or swelling. No visible canal hematoma. Disc levels: There is mild degenerative change at the C1-C2 articulation with degenerative pannus about the dens. There is no evidence of significant narrowing of the craniocervical  junction. There is multilevel facet arthropathy most advanced on the left at C2-C3 and C3-C4. There is no evidence of high-grade spinal canal or neural foraminal stenosis. CT THORACIC SPINE FINDINGS Alignment: Normal. There is no jumped or perched facet or other evidence of traumatic malalignment. Vertebrae: Vertebral body heights are preserved. There is no evidence of acute fracture. There is no suspicious osseous lesion. Paraspinal and other soft tissues: Unremarkable. Disc levels: There is multilevel degenerative endplate change with flowing anterior osteophytes in the thoracic spine consistent with diffuse idiopathic skeletal hyperostosis. There is overall mild multilevel facet arthropathy. There is no evidence of significant spinal canal or neural foraminal stenosis. CT LUMBAR SPINE FINDINGS Segmentation: Standard; the lowest formed disc space is designated L5-S1. Alignment: Normal. There is no jumped or perched facet or other evidence of traumatic malalignment. Vertebrae: Vertebral body heights are preserved. There is no evidence of acute fracture. There is no suspicious osseous lesion. Postsurgical  changes reflecting posterior instrumented fusion and decompression there is solid fusion across the disc spaces and posterior elements. From L3 through S1 are noted. There is no evidence of hardware related complication. Paraspinal and other soft tissues: There is a 0.8 cm nonobstructing right renal stone. A left upper pole renal cyst is noted requiring no specific imaging follow-up. Disc levels: T12-L1: There is a partially calcified disc protrusion resulting mild spinal canal stenosis. No significant neural foraminal stenosis L1-L2: No significant spinal canal or neural foraminal stenosis L2-L3: There is a disc bulge, degenerative endplate spurring, and mild facet arthropathy resulting in severe spinal canal stenosis and mild left and no significant right neural foraminal stenosis L3-L4: There is a disc bulge,  degenerative endplate spurring, and moderate bilateral facet arthropathy resulting severe spinal canal stenosis and moderate to severe bilateral neural foraminal stenosis L4-L5: Status post posterior instrumented fusion and decompression without convincing evidence of significant spinal canal or neural foraminal stenosis L5-S1: Status post posterior instrumented fusion and decompression. Endplate spurring resulting in mild-to-moderate bilateral neural foraminal stenosis. No evidence of significant spinal canal stenosis IMPRESSION: 1. No evidence of acute fracture or traumatic malalignment of the cervical, thoracic, or lumbar spine. 2. Postsurgical changes in the cervical and lumbar spine as above without evidence of complication. 3. Multifactorial severe spinal canal stenosis at L2-L3 and L3-L4 and moderate to severe bilateral neural foraminal stenosis at L3-L4. 4. 0.8 cm nonobstructing right renal stone. Electronically Signed   By: Valetta Mole M.D.   On: 06/04/2022 08:34   CT HEAD CODE STROKE WO CONTRAST  Result Date: 06/04/2022 CLINICAL DATA:  Code stroke. EXAM: CT HEAD WITHOUT CONTRAST TECHNIQUE: Contiguous axial images were obtained from the base of the skull through the vertex without intravenous contrast. RADIATION DOSE REDUCTION: This exam was performed according to the departmental dose-optimization program which includes automated exposure control, adjustment of the mA and/or kV according to patient size and/or use of iterative reconstruction technique. COMPARISON:  CT head 11/14/2017 FINDINGS: Brain: There is no acute intracranial hemorrhage, extra-axial fluid collection, or acute infarct. Parenchymal volume is normal for age. The ventricles are normal in size. Gray-white differentiation is preserved patchy hypodensity in the supratentorial white matter likely reflects sequela of chronic small-vessel ischemic change. The pituitary and suprasellar region are normal. There is no mass lesion. There is no  mass effect or midline shift. Vascular: There is calcification of the bilateral carotid siphons and vertebral arteries. No hyperdense vessel is seen. Skull: Choose Sinuses/Orbits: The paranasal sinuses are clear. Bilateral lens implants are in place. The globes and orbits are otherwise unremarkable. Other: None. ASPECTS [facility] Stroke Program Early CT Score) - Ganglionic level infarction (caudate, lentiform nuclei, internal capsule, insula, M1-M3 cortex): 7 - Supraganglionic infarction (M4-M6 cortex): 3 Total score (0-10 with 10 being normal): 10 IMPRESSION: No acute intracranial pathology. Findings communicated to Dr Curly Shores via Shea Evans at 8:11 am. Electronically Signed   By: Valetta Mole M.D.   On: 06/04/2022 08:12    Pending Labs Unresulted Labs (From admission, onward)     Start     Ordered   06/05/22 0500  Hemoglobin A1c  Tomorrow morning,   R        06/04/22 1000   06/05/22 0500  Lipid panel  Tomorrow morning,   R        06/04/22 1000   06/04/22 1141  CK  Once,   URGENT        06/04/22 1140   06/04/22 1030  Urine Culture (  for pregnant, neutropenic or urologic patients or patients with an indwelling urinary catheter)  (Urine Labs)  Once,   URGENT       Question:  Indication  Answer:  Altered mental status (if no other cause identified)   06/04/22 1029            Vitals/Pain Today's Vitals   06/04/22 1130 06/04/22 1145 06/04/22 1200 06/04/22 1213  BP: (!) 156/72 (!) 148/73    Pulse: (!) 102 85 88   Resp: (!) 24 16 (!) 25   Temp:    99.4 F (37.4 C)  TempSrc:    Oral  SpO2: 100% 98% 99%   Weight:      Height:      PainSc:        Isolation Precautions Airborne and Contact precautions  Medications Medications  enoxaparin (LOVENOX) injection 60 mg (has no administration in time range)  cefTRIAXone (ROCEPHIN) 1 g in sodium chloride 0.9 % 100 mL IVPB (0 g Intravenous Stopped 06/04/22 1151)    Mobility walks with person assist     Focused Assessments Neuro Assessment  Handoff:  Swallow screen pass?  Will complete prior to bringing up   NIH Stroke Scale  Dizziness Present: No Headache Present: No Interval: Other (Comment) (q2 NIH) Level of Consciousness (1a.)   : Alert, keenly responsive LOC Questions (1b. )   : Answers both questions correctly LOC Commands (1c. )   : Performs both tasks correctly Best Gaze (2. )  : Normal Visual (3. )  : No visual loss Facial Palsy (4. )    : Normal symmetrical movements Motor Arm, Left (5a. )   : Drift Motor Arm, Right (5b. ) : No effort against gravity Motor Leg, Left (6a. )  : No drift Motor Leg, Right (6b. ) : No drift Limb Ataxia (7. ): Absent Sensory (8. )  : Mild-to-moderate sensory loss, patient feels pinprick is less sharp or is dull on the affected side, or there is a loss of superficial pain with pinprick, but patient is aware of being touched Best Language (9. )  : No aphasia Dysarthria (10. ): Normal Extinction/Inattention (11.)   : No Abnormality Complete NIHSS TOTAL: 5 Last date known well: 06/03/22 Last time known well: 1900 Neuro Assessment:   Neuro Checks:   Initial (06/04/22 0830)  Has TPA been given? No If patient is a Neuro Trauma and patient is going to OR before floor call report to [place] nurse: [phone] or [phone]   R Recommendations: See Admitting Provider Note  Report given to:   Additional Notes: more than likely stenosis in c spine causing symptoms

## 2022-06-05 ENCOUNTER — Encounter (HOSPITAL_COMMUNITY)
Admission: EM | Disposition: A | Discharge status: Skilled Nursing Facility | Payer: Self-pay | Source: Home / Self Care | Attending: Internal Medicine

## 2022-06-05 ENCOUNTER — Inpatient Hospital Stay (HOSPITAL_COMMUNITY): Payer: Medicare Other | Admitting: Certified Registered"

## 2022-06-05 ENCOUNTER — Encounter (HOSPITAL_COMMUNITY): Payer: Self-pay | Admitting: Internal Medicine

## 2022-06-05 ENCOUNTER — Inpatient Hospital Stay (HOSPITAL_COMMUNITY): Payer: Medicare Other

## 2022-06-05 ENCOUNTER — Encounter (HOSPITAL_COMMUNITY): Payer: Self-pay | Admitting: Certified Registered"

## 2022-06-05 DIAGNOSIS — R7401 Elevation of levels of liver transaminase levels: Secondary | ICD-10-CM

## 2022-06-05 DIAGNOSIS — I10 Essential (primary) hypertension: Secondary | ICD-10-CM

## 2022-06-05 DIAGNOSIS — R531 Weakness: Secondary | ICD-10-CM | POA: Diagnosis not present

## 2022-06-05 DIAGNOSIS — R296 Repeated falls: Secondary | ICD-10-CM

## 2022-06-05 DIAGNOSIS — I2510 Atherosclerotic heart disease of native coronary artery without angina pectoris: Secondary | ICD-10-CM

## 2022-06-05 DIAGNOSIS — Z87891 Personal history of nicotine dependence: Secondary | ICD-10-CM

## 2022-06-05 HISTORY — PX: RADIOLOGY WITH ANESTHESIA: SHX6223

## 2022-06-05 LAB — COMPREHENSIVE METABOLIC PANEL
ALT: 33 U/L (ref 0–44)
AST: 63 U/L — ABNORMAL HIGH (ref 15–41)
Albumin: 2.8 g/dL — ABNORMAL LOW (ref 3.5–5.0)
Alkaline Phosphatase: 45 U/L (ref 38–126)
Anion gap: 11 (ref 5–15)
BUN: 22 mg/dL (ref 8–23)
CO2: 22 mmol/L (ref 22–32)
Calcium: 8.5 mg/dL — ABNORMAL LOW (ref 8.9–10.3)
Chloride: 101 mmol/L (ref 98–111)
Creatinine, Ser: 0.88 mg/dL (ref 0.61–1.24)
GFR, Estimated: >60 mL/min (ref >60)
Glucose, Bld: 153 mg/dL — ABNORMAL HIGH (ref 70–99)
Potassium: 3.4 mmol/L — ABNORMAL LOW (ref 3.5–5.1)
Sodium: 134 mmol/L — ABNORMAL LOW (ref 135–145)
Total Bilirubin: 1.3 mg/dL — ABNORMAL HIGH (ref 0.3–1.2)
Total Protein: 5.7 g/dL — ABNORMAL LOW (ref 6.5–8.1)

## 2022-06-05 LAB — CBC WITH DIFFERENTIAL/PLATELET
Abs Immature Granulocytes: 0.07 10*3/uL (ref 0.00–0.07)
Basophils Absolute: 0 10*3/uL (ref 0.0–0.1)
Basophils Relative: 0 %
Eosinophils Absolute: 0 10*3/uL (ref 0.0–0.5)
Eosinophils Relative: 0 %
HCT: 35 % — ABNORMAL LOW (ref 39.0–52.0)
Hemoglobin: 11.8 g/dL — ABNORMAL LOW (ref 13.0–17.0)
Immature Granulocytes: 1 %
Lymphocytes Relative: 6 %
Lymphs Abs: 0.7 10*3/uL (ref 0.7–4.0)
MCH: 30.9 pg (ref 26.0–34.0)
MCHC: 33.7 g/dL (ref 30.0–36.0)
MCV: 91.6 fL (ref 80.0–100.0)
Monocytes Absolute: 0.9 10*3/uL (ref 0.1–1.0)
Monocytes Relative: 8 %
Neutro Abs: 10.3 10*3/uL — ABNORMAL HIGH (ref 1.7–7.7)
Neutrophils Relative %: 85 %
Platelets: 126 10*3/uL — ABNORMAL LOW (ref 150–400)
RBC: 3.82 MIL/uL — ABNORMAL LOW (ref 4.22–5.81)
RDW: 13.2 % (ref 11.5–15.5)
WBC: 12 10*3/uL — ABNORMAL HIGH (ref 4.0–10.5)
nRBC: 0 % (ref 0.0–0.2)

## 2022-06-05 LAB — GLUCOSE, CAPILLARY
Glucose-Capillary: 149 mg/dL — ABNORMAL HIGH (ref 70–99)
Glucose-Capillary: 142 mg/dL — ABNORMAL HIGH (ref 70–99)
Glucose-Capillary: 151 mg/dL — ABNORMAL HIGH (ref 70–99)

## 2022-06-05 LAB — HEMOGLOBIN A1C
Hgb A1c MFr Bld: 8.1 % — ABNORMAL HIGH (ref 4.8–5.6)
Mean Plasma Glucose: 185.77 mg/dL

## 2022-06-05 LAB — LIPID PANEL
Cholesterol: 99 mg/dL (ref 0–200)
HDL: 36 mg/dL — ABNORMAL LOW (ref >40)
LDL Cholesterol: 46 mg/dL (ref 0–99)
Total CHOL/HDL Ratio: 2.8 RATIO
Triglycerides: 87 mg/dL (ref <150)
VLDL: 17 mg/dL (ref 0–40)

## 2022-06-05 LAB — CK: Total CK: 1545 U/L — ABNORMAL HIGH (ref 49–397)

## 2022-06-05 SURGERY — MRI WITH ANESTHESIA
Anesthesia: General

## 2022-06-05 SURGERY — RADIOLOGY WITH ANESTHESIA
Anesthesia: General

## 2022-06-05 MED ORDER — MELATONIN 3 MG PO TABS
3.0000 mg | ORAL_TABLET | Freq: Every day | ORAL | Status: DC
  Administered 2022-06-05 – 2022-06-09 (×5): 3 mg via ORAL
  Filled 2022-06-05 (×5): qty 1

## 2022-06-05 MED ORDER — ONDANSETRON HCL 4 MG/2ML IJ SOLN
INTRAMUSCULAR | Status: DC | PRN
  Administered 2022-06-05: 4 mg via INTRAVENOUS

## 2022-06-05 MED ORDER — SUCCINYLCHOLINE CHLORIDE 200 MG/10ML IV SOSY
PREFILLED_SYRINGE | INTRAVENOUS | Status: DC | PRN
  Administered 2022-06-05: 160 mg via INTRAVENOUS

## 2022-06-05 MED ORDER — PROPOFOL 10 MG/ML IV BOLUS
INTRAVENOUS | Status: DC | PRN
  Administered 2022-06-05: 140 mg via INTRAVENOUS

## 2022-06-05 MED ORDER — FENTANYL CITRATE (PF) 100 MCG/2ML IJ SOLN
25.0000 ug | INTRAMUSCULAR | Status: DC | PRN
  Administered 2022-06-05: 25 ug via INTRAVENOUS

## 2022-06-05 MED ORDER — METHOCARBAMOL 750 MG PO TABS
750.0000 mg | ORAL_TABLET | Freq: Three times a day (TID) | ORAL | Status: DC
  Administered 2022-06-05 – 2022-06-10 (×16): 750 mg via ORAL
  Filled 2022-06-05 (×16): qty 1

## 2022-06-05 MED ORDER — FENTANYL CITRATE (PF) 100 MCG/2ML IJ SOLN
INTRAMUSCULAR | Status: DC | PRN
  Administered 2022-06-05 (×2): 50 ug via INTRAVENOUS

## 2022-06-05 MED ORDER — CHLORHEXIDINE GLUCONATE 0.12 % MT SOLN: 15.0000 mL | Freq: Once | OROMUCOSAL | Status: AC

## 2022-06-05 MED ORDER — ACETAMINOPHEN 500 MG PO TABS
1000.0000 mg | ORAL_TABLET | Freq: Three times a day (TID) | ORAL | Status: DC
  Administered 2022-06-05 – 2022-06-10 (×16): 1000 mg via ORAL
  Filled 2022-06-05 (×16): qty 2

## 2022-06-05 MED ORDER — LIDOCAINE 2% (20 MG/ML) 5 ML SYRINGE
INTRAMUSCULAR | Status: DC | PRN
  Administered 2022-06-05: 60 mg via INTRAVENOUS

## 2022-06-05 MED ORDER — SUGAMMADEX SODIUM 200 MG/2ML IV SOLN
INTRAVENOUS | Status: DC | PRN
  Administered 2022-06-05: 200 mg via INTRAVENOUS

## 2022-06-05 MED ORDER — FENTANYL CITRATE (PF) 100 MCG/2ML IJ SOLN
INTRAMUSCULAR | Status: AC
  Filled 2022-06-05: qty 2

## 2022-06-05 MED ORDER — POTASSIUM CHLORIDE CRYS ER 20 MEQ PO TBCR
40.0000 meq | EXTENDED_RELEASE_TABLET | Freq: Once | ORAL | Status: AC
  Administered 2022-06-05: 40 meq via ORAL
  Filled 2022-06-05: qty 2

## 2022-06-05 MED ORDER — CHLORHEXIDINE GLUCONATE 0.12 % MT SOLN
OROMUCOSAL | Status: AC
  Administered 2022-06-05: 15 mL via OROMUCOSAL
  Filled 2022-06-05: qty 15

## 2022-06-05 MED ORDER — LACTATED RINGERS IV SOLN: INTRAVENOUS | Status: DC

## 2022-06-05 MED ORDER — ORAL CARE MOUTH RINSE: 15.0000 mL | Freq: Once | OROMUCOSAL | Status: AC

## 2022-06-05 MED ORDER — DEXMEDETOMIDINE HCL IN NACL 80 MCG/20ML IV SOLN
INTRAVENOUS | Status: DC | PRN
  Administered 2022-06-05: 8 ug via BUCCAL

## 2022-06-05 MED ORDER — PHENYLEPHRINE 80 MCG/ML (10ML) SYRINGE FOR IV PUSH (FOR BLOOD PRESSURE SUPPORT)
PREFILLED_SYRINGE | INTRAVENOUS | Status: DC | PRN
  Administered 2022-06-05 (×2): 240 ug via INTRAVENOUS

## 2022-06-05 MED ORDER — ROCURONIUM BROMIDE 10 MG/ML (PF) SYRINGE
PREFILLED_SYRINGE | INTRAVENOUS | Status: DC | PRN
  Administered 2022-06-05: 50 mg via INTRAVENOUS

## 2022-06-05 NOTE — Transfer of Care (Signed)
Immediate Anesthesia Transfer of Care Note  Patient: [name]  Procedure(s) Performed: MRI WITH ANESTHESIA  Patient Location: PACU  Anesthesia Type:General  Level of Consciousness: awake, alert , and oriented  Airway & Oxygen Therapy: Patient Spontanous Breathing and Patient connected to nasal cannula oxygen  Post-op Assessment: Report given to RN  Post vital signs: Reviewed and stable  Last Vitals:  Vitals Value Taken Time  BP 123/63 06/05/22 1239  Temp    Pulse 75 06/05/22 1245  Resp 21 06/05/22 1245  SpO2 96 % 06/05/22 1245  Vitals shown include unvalidated device data.  Last Pain:  Vitals:   06/05/22 1000  TempSrc:   PainSc: 7       Patients Stated Pain Goal: 3 ([id number])  Complications: No notable events documented.

## 2022-06-05 NOTE — Progress Notes (Signed)
Patient ID: [name], male   DOB: 22-Aug-1945, 77 y.o.   MRN: [id number] BP (!) 115/54 (BP Location: Right Arm)   Pulse 68   Temp 98.4 F (36.9 C) (Oral)   Resp 20   Ht 5' 9"$  (1.753 m)   Wt 108.9 kg   SpO2 96%   BMI 35.44 kg/m  Currently asleep. All films reviewed. Lumbar spine is not causing profound weakness anywhere. Over read by radiology on the ct scan, there is no severe stenosis whatsoever in the lumbar region.  Cervical spine shows multiple levels with degenerative changes.  No acute changes. Believe shoulder weakness consistent with shoulder pathology. All changes in the cspine are chronic. Recommend shoulder evaluation. I will patient tomorrow when he is awake.  No emergent neurosurgical problem at this time.

## 2022-06-05 NOTE — Progress Notes (Signed)
PT Cancellation Note  Patient Details Name: [name] MRN: [id number] DOB: October 08, 1945   Cancelled Treatment:    Reason Eval/Treat Not Completed: Patient at procedure or test/unavailable. Will check back later today as time allows.   Leighton Roach, PT  Acute Rehab Services Secure chat preferred Office [place] 06/05/2022, 8:29 AM

## 2022-06-05 NOTE — Anesthesia Postprocedure Evaluation (Signed)
Anesthesia Post Note  Patient: [name]  Procedure(s) Performed: MRI WITH ANESTHESIA     Patient location during evaluation: PACU Anesthesia Type: General Level of consciousness: awake and alert Pain management: pain level controlled Vital Signs Assessment: post-procedure vital signs reviewed and stable Respiratory status: spontaneous breathing, nonlabored ventilation, respiratory function stable and patient connected to nasal cannula oxygen Cardiovascular status: blood pressure returned to baseline and stable Postop Assessment: no apparent nausea or vomiting Anesthetic complications: no  No notable events documented.  Last Vitals:  Vitals:   06/05/22 1310 06/05/22 1359  BP: 136/66 (!) 140/61  Pulse: 73 72  Resp: 18 18  Temp: 37 C 36.8 C  SpO2: 100% 99%    Last Pain:  Vitals:   06/05/22 1359  TempSrc: Oral  PainSc:                  Effie Berkshire

## 2022-06-05 NOTE — Progress Notes (Signed)
OT Cancellation Note  Patient Details Name: [name] MRN: [id number] DOB: 1945-09-25   Cancelled Treatment:    Reason Eval/Treat Not Completed: Patient at procedure or test/ unavailable. Will check back as able.   [name], OT Acute Rehabilitation Services Office [phone]   [name] 06/05/2022, 9:09 AM

## 2022-06-05 NOTE — Anesthesia Procedure Notes (Signed)
Procedure Name: Intubation Date/Time: 06/05/2022 11:07 AM  Performed by: Barrington Ellison, CRNAPre-anesthesia Checklist: Patient identified, Emergency Drugs available, Suction available and Patient being monitored Patient Re-evaluated:Patient Re-evaluated prior to induction Oxygen Delivery Method: Circle System Utilized Preoxygenation: Pre-oxygenation with 100% oxygen Induction Type: IV induction and Rapid sequence Laryngoscope Size: Mac and 4 Grade View: Grade I Tube type: Oral Tube size: 7.5 mm Number of attempts: 1 Airway Equipment and Method: Stylet and Oral airway Placement Confirmation: ETT inserted through vocal cords under direct vision, positive ETCO2 and breath sounds checked- equal and bilateral Secured at: 22 cm Tube secured with: Tape Dental Injury: Teeth and Oropharynx as per pre-operative assessment

## 2022-06-05 NOTE — Anesthesia Preprocedure Evaluation (Addendum)
Anesthesia Evaluation  Patient identified by MRN, date of birth, ID band Patient awake    Reviewed: Allergy & Precautions, NPO status , Patient's Chart, lab work & pertinent test results  Airway Mallampati: I  TM Distance: >3 FB Neck ROM: Full    Dental  (+) Edentulous Upper, Edentulous Lower   Pulmonary former smoker   breath sounds clear to auscultation       Cardiovascular hypertension, Pt. on medications and Pt. on home beta blockers + CAD   Rhythm:Regular Rate:Normal     Neuro/Psych  Headaches PSYCHIATRIC DISORDERS Anxiety Depression       GI/Hepatic Neg liver ROS,GERD  ,,  Endo/Other  diabetes, Type 2, Oral Hypoglycemic AgentsHypothyroidism    Renal/GU negative Renal ROS     Musculoskeletal  (+) Arthritis ,    Abdominal   Peds  Hematology negative hematology ROS (+)   Anesthesia Other Findings   Reproductive/Obstetrics                             Anesthesia Physical Anesthesia Plan  ASA: 3  Anesthesia Plan: General   Post-op Pain Management: Minimal or no pain anticipated   Induction: Intravenous  PONV Risk Score and Plan: 2 and Ondansetron and Midazolam  Airway Management Planned: Oral ETT  Additional Equipment: None  Intra-op Plan:   Post-operative Plan: Extubation in OR  Informed Consent: I have reviewed the patients History and Physical, chart, labs and discussed the procedure including the risks, benefits and alternatives for the proposed anesthesia with the patient or authorized representative who has indicated his/her understanding and acceptance.       Plan Discussed with: CRNA  Anesthesia Plan Comments:        Anesthesia Quick Evaluation

## 2022-06-05 NOTE — Progress Notes (Signed)
Patient external catheter had not been to suction during procedure,patient was wet; did a linen change before leaving the PACU, which is why there is a time gap between patient arrival to floor and patient leaving PACU

## 2022-06-05 NOTE — Progress Notes (Signed)
  Transition of Care [facility]) Screening Note   Patient Details  Name: [name] Date of Birth: 31-Jul-1945   Transition of Care [facility]) CM/SW Contact:    Pollie Friar, RN Phone Number: 06/05/2022, 3:42 PM   Pt is from home alone. Pt endorses multiple falls at home. Awaiting PT/OT evals. Transition of Care Department [facility]) has reviewed patient. We will continue to monitor patient advancement through interdisciplinary progression rounds. If new patient transition needs arise, please place a TOC consult.

## 2022-06-05 NOTE — Progress Notes (Incomplete)
Neurology Progress Note  Brief HPI: [name] is a 77 y.o. male with a past medical history significant for hypertension, hyperlipidemia, type 2 diabetes, hypothyroidism, coronary artery disease s/p stent, tuberculosis, obesity (BMI 37.18), cervical spine fusion, lumbar spine fusion, chronic pain on chronic opiates, anxiety/depression, paroxysmal atrial tachycardia, NSVT, smoking   He reports he had some slight chills and shivering on 2/20 but denies any other infectious symptoms other than mild nausea.  He had a slight headache when he went to bed at 7 PM on 2/20 and was having more than normal chronic lower back pain.  He awoke at 3 AM 2/21, thinks he had fallen out of bed and may have fallen on his right arm. He did have to call EMS to assist him back up.  He was able to ambulate to his lift assist chair, but did note he was having some right upper extremity weakness.  This has happened previously (last approximately 1 month ago, not as severe and did not last as long, again thinks he may have laid on it too long) and he thought it might resolve. He was also having difficulty getting out of the chair due to low back pain.  Eventually he was able to get up out of his chair but fell again, activating EMS again.  Due to his persistent right upper extremity weakness they activated code stroke and brought him to [facility] for evaluation   Subjective: Patient seen in hallway on the way to his MRI under anesthesia. Unable to move right arm on brief exam.   Exam: Vitals:   06/04/22 2320 06/05/22 0317  BP: (!) 127/59 131/60  Pulse: 80 85  Resp: 16 16  Temp: 99.7 F (37.6 C) 99.3 F (37.4 C)  SpO2: 99% 97%   Gen: In bed, NAD Resp: non-labored breathing, no acute distress Abd: soft, nt  Neuro: Mental Status: Patient is awake, alert, oriented to person, place, month, age, and situation. Patient is able to give a clear and coherent history. No signs of aphasia or neglect Cranial  Nerves: II: Visual Fields are full. Pupils are equal, round, and reactive to light.   III,IV, VI: EOMI without diploplia, some left eye ptosis secondary to edema.  V: Facial sensation is symmetric to temperature VII: Facial movement is notable for slight right facial droop.  VIII: Hard of hearing at baseline. Hearing aides in place X: Uvula elevates symmetrically XI: Shoulder shrug is symmetric. XII: tongue is midline without atrophy or fasciculations.  Motor: Examination is very effort/pain limited Moves lower extremities to gravity bilaterally but unable to keep them elevated off the bed.  Does not move right upper extremity, does not withdraw to pain. Has 10/10 pain at the shoulder with passive movement limiting exam. 4/5 left upper extremity Sensory: Sensation is symmetric to light tough in upper extremities bilaterally  Deep Tendon Reflexes: Absent right upper extremity biceps and brachioradialis, trace triceps reflex.  On the left side he has diminished but 2+ biceps, brachioradialis and triceps.  2+ and symmetric patellar's, 2+ and symmetric Achilles, Plantars: Toes are mute bilaterally Cerebellar: Finger-nose is intact in the left upper extremity, unable to perform in the lower extremities due to back pain Gait:  Deferred for patient's safety   Pertinent Labs: K3.4 CK 4751 -> 1545 WBC 16.6 -> 12.0  Imaging Reviewed: CT Brain CT C/T/L-spine 1. No evidence of acute fracture or traumatic malalignment of the cervical, thoracic, or lumbar spine. 2. Postsurgical changes in the cervical  and lumbar spine as above without evidence of complication. 3. Multifactorial severe spinal canal stenosis at L2-L3 and L3-L4 and moderate to severe bilateral neural foraminal stenosis at L3-L4. 4. 0.8 cm nonobstructing right renal stone.  MRI head: 1. No acute abnormality. 2. 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   MRI cervical spine: 1. At C5-C6, moderate to severe right  foraminal stenosis which is not substantially changed 2. At C3-C4, mild to moderate left foraminal stenosis which is progressed. 3. At C4-C5, mild to moderate canal stenosis with mild bilateral foraminal stenosis which is progressed. 4. C5-C7 ACDF.   MRI lumbar spine: 1. At L2-L3, progressive moderate canal stenosis with left subarticular recess narrowing and mild left foraminal stenosis. 2. At L3-L4, mildly progressive moderate left greater than right foraminal stenosis. Subarticular recess narrowing at this level with patent canal status post decompression.  3. L4-S1 PLIF with patent canal and foramina at these levels.  Assessment:  77 y.o. male presenting after a fall with right upper extremity weakness.  He initially presented on 2/21 as a code stroke after 2 falls and persistent right upper extremity weakness.  MRI completed yesterday.CK was elevated at 4751 now done to 1545  Impression:  Functional limb weakness vs cervical/brachial nerve injury vs pain limited   Recommendations: - IVF for CK    Patient seen and examined by NP/APP with MD. MD to update note as needed.   Janine Ores, DNP, FNP-BC [facility] Pager: [phone]

## 2022-06-05 NOTE — Progress Notes (Signed)
Internal Medicine Attending:   I saw and examined the patient. I reviewed the resident's note and I agree with the resident's findings and plan as documented in the resident's note.  In brief, patient is 77 year old male with a past medical history of chronic back pain with left lower extremity radicular pain, hypertension, hypothyroidism, CAD, type 2 diabetes, gout, GERD and depression who presented to the ED with left arm weakness status post a fall at home in setting of a history of recurrent falls (approximately 2-3 times a month).  Patient denies any syncope but went to bed and woke up on the floor and noticed that he was unable to use his right arm.  The etiology behind his right upper extremity weakness remains uncertain at this time and the concern is that he has either a brachial plexus injury versus cervical radiculopathy.  Patient had initial imaging with CT scans which showed severe spinal stenosis at L2-L3 and L3-L4 with moderate bilateral neuroforaminal stenosis at L3-L4.  However, these would not explain his right upper extremity weakness.  Case was discussed with neurosurgery who recommended MRI of his cervical spine versus a CT myelogram.  Patient scheduled to have his MRI brain/C-spine under anesthesia today.  Will follow-up results.  On exam, patient with persistent right upper extremity weakness and inability to move the arm at the shoulder or the elbow.  He does have some right grip strength 2+.  Sensation is intact.  Power is 5 out of 5 left upper extremity and 4 out of 5 in bilateral lower extremities.  Patient would benefit from PT/OT evaluation.  Neurology also evaluated the patient and determined that CVA was less likely.  However, given his new onset weakness we will obtain an MRI brain.  Continue dual antiplatelet therapy as well as high statin for now.  Will continue with c-collar until his MRI results are back.   Of note, patient does have a history of recurrent falls secondary to  left lower extremity weakness and his CT does show severe spinal stenosis at L2-L3 and L3-L4.  Will obtain an MRI of his L-spine to further evaluate this.  Will continue with Tylenol and lidocaine patch for pain control for now.  Suspect the patient will likely need other agents to help control his back pain.  Patient also woke up on the floor after going to sleep in bed and did not remember the events leading to this.  There was concern for possible underlying seizure.  EEG was done which did not show any seizure-like activity.  Will continue to monitor closely.

## 2022-06-05 NOTE — Progress Notes (Signed)
Subjective:   Summary: [name] is a 77 y.o. year old male currently admitted on the IMTS HD#1 for right arm weakness.  Overnight Events: Continues to endorse significant pain overnight despite Tylenol, one-time dose of Dilaudid.  Anesthesia planned today for MRI.  Assessed after MRI. He has persistent right arm weakness and is also endorsing significant lower back pain.  Otherwise, he is doing well.  No postanesthesia complications.  Discussed pain management and neck steps with him and family member in the room.  Objective:  Vital signs in last 24 hours: Vitals:   06/04/22 1542 06/04/22 1925 06/04/22 2320 06/05/22 0317  BP: (!) 127/57 (!) 114/51 (!) 127/59 131/60  Pulse: 85 81 80 85  Resp: 20 16 16 16  $ Temp: 99.6 F (37.6 C) 99 F (37.2 C) 99.7 F (37.6 C) 99.3 F (37.4 C)  TempSrc: Oral Oral Oral Oral  SpO2: 98% 95% 99% 97%  Weight:      Height:       Supplemental O2: Room Air SpO2: 97 %   Physical Exam:  Constitutional: Elderly appearing, NAD Cardiovascular: RRR, no murmurs, rubs or gallops Pulmonary/Chest: normal work of breathing on room air, lungs clear to auscultation bilaterally Abdominal: soft, non-tender, non-distended Skin: warm and dry Extremities: upper/lower extremity pulses 2+ MSK: Tenderness palpation over right lumbar paraspinal muscles.  No acute step-off over spine or tenderness over spine. Neuro: Alert and oriented x 3.  EOMI.  Cranial nerves grossly intact.  Sensation intact.  2/5 strength in right shoulder, 1/5 strength right elbow flexion, right forearm supination.  3/5 strength right hand/grip.  5/5 strength left upper extremity.  5/5 strength bilateral lower extremities.     Filed Weights   06/04/22 0700  Weight: 114.2 kg     Intake/Output Summary (Last 24 hours) at 06/05/2022 [id number] Last data filed at 06/04/2022 1800 Gross per 24 hour  Intake 320 ml  Output 200 ml  Net 120 ml   Net IO Since Admission: 120 mL  [06/05/22 0637]  Pertinent Labs:    Latest Ref Rng & Units 06/05/2022    5:10 AM 06/04/2022    8:00 AM 06/04/2022    7:54 AM  CBC  WBC 4.0 - 10.5 K/uL 12.0   16.6   Hemoglobin 13.0 - 17.0 g/dL 11.8  11.9  12.5   Hematocrit 39.0 - 52.0 % 35.0  35.0  36.0   Platelets 150 - 400 K/uL 126   145        Latest Ref Rng & Units 06/05/2022    5:10 AM 06/04/2022    8:00 AM 06/04/2022    7:54 AM  CMP  Glucose 70 - 99 mg/dL 153  177  176   BUN 8 - 23 mg/dL 22  29  25   $ Creatinine 0.61 - 1.24 mg/dL 0.88  0.80  0.94   Sodium 135 - 145 mmol/L 134  136  133   Potassium 3.5 - 5.1 mmol/L 3.4  3.8  3.9   Chloride 98 - 111 mmol/L 101  104  100   CO2 22 - 32 mmol/L 22   20   Calcium 8.9 - 10.3 mg/dL 8.5   8.9   Total Protein 6.5 - 8.1 g/dL 5.7   6.3   Total Bilirubin 0.3 - 1.2 mg/dL 1.3   1.5   Alkaline Phos 38 - 126 U/L 45   45  AST 15 - 41 U/L 63   92   ALT 0 - 44 U/L 33   35   CK 1545 A1c 8.1  Imaging: EEG adult  Result Date: 06/04/2022 Lora Havens, MD     06/04/2022  4:04 PM Patient Name: [name] MRN: [id number] Epilepsy Attending: Lora Havens Referring Physician/Provider: Lorenza Chick, MD Date: 06/04/2022 Duration: 21.10 mins Patient history: 56 to M presenting with acute right upper extremity weakness after recent fall with associated numbness. EEG to evaluate for seizure Level of alertness: Awake AEDs during EEG study: None Technical aspects: This EEG study was done with scalp electrodes positioned according to the 10-20 International system of electrode placement. Electrical activity was reviewed with band pass filter of 1-70Hz$ , sensitivity of 7 uV/mm, display speed of 75m/sec with a 60Hz$  notched filter applied as appropriate. EEG data were recorded continuously and digitally stored.  Video monitoring was available and reviewed as appropriate. Description: No clear posterior dominant rhythm was seen. EEG showed continuous generalized 3 to 7 Hz theta-delta slowing.   Hyperventilation and photic stimulation were not performed.   Of note, EEG was technically difficult due to significant electrode artifact. ABNORMALITY - Continuous slow, generalized IMPRESSION: This technically difficult study is suggestive of moderate diffuse encephalopathy, nonspecific etiology.  No seizures or epileptiform discharges were seen throughout the recording. If suspicion for interictal activity remains a concern, a repeat study can be considered. PLora Havens  DG Chest Portable 1 View  Result Date: 06/04/2022 CLINICAL DATA:  Chills, weakness, possible stroke, fell yesterday, RIGHT arm weakness, history coronary artery disease, hypertension, diabetes mellitus, former smoker EXAM: PORTABLE CHEST 1 VIEW COMPARISON:  Portable exam 0854 hours compared to 11/08/2015 FINDINGS: Upper normal size of cardiac silhouette. Mediastinal contours and pulmonary vascularity normal. Lungs clear. No infiltrate, pleural effusion or pneumothorax. Prior cervical spine fusion and chronic LEFT rotator cuff tear noted. IMPRESSION: No acute abnormalities. Electronically Signed   By: MLavonia DanaM.D.   On: 06/04/2022 09:07   CT Cervical Spine Wo Contrast  Result Date: 06/04/2022 CLINICAL DATA:  Back pain after a fall EXAM: CT CERVICAL, THORACIC, AND LUMBAR SPINE WITHOUT CONTRAST TECHNIQUE: Multidetector CT imaging of the cervical, thoracic and lumbar spine was performed without intravenous contrast. Multiplanar CT image reconstructions were also generated. RADIATION DOSE REDUCTION: This exam was performed according to the departmental dose-optimization program which includes automated exposure control, adjustment of the mA and/or kV according to patient size and/or use of iterative reconstruction technique. COMPARISON:  Cervical spine CT 11/14/2017, CT abdomen/pelvis 12/07/2016, lumbar spine MRI 11/13/2015, two-view chest radiograph 11/08/2015 FINDINGS: CT CERVICAL SPINE FINDINGS Alignment: Normal. There is no  antero or retrolisthesis. There is no jumped or perched facet or other evidence of traumatic malalignment. Skull base and vertebrae: Skull base alignment is maintained. Vertebral body heights are preserved. There is no evidence of acute fracture. There is no suspicious osseous lesion postsurgical changes reflecting C5 through C7 ACDF are noted. Hardware alignment is within expected limits, without evidence of complication. There is solid fusion across the disc spaces. Soft tissues and spinal canal: No prevertebral fluid or swelling. No visible canal hematoma. Disc levels: There is mild degenerative change at the C1-C2 articulation with degenerative pannus about the dens. There is no evidence of significant narrowing of the craniocervical junction. There is multilevel facet arthropathy most advanced on the left at C2-C3 and C3-C4. There is no evidence of high-grade spinal canal or neural foraminal stenosis. CT THORACIC SPINE  FINDINGS Alignment: Normal. There is no jumped or perched facet or other evidence of traumatic malalignment. Vertebrae: Vertebral body heights are preserved. There is no evidence of acute fracture. There is no suspicious osseous lesion. Paraspinal and other soft tissues: Unremarkable. Disc levels: There is multilevel degenerative endplate change with flowing anterior osteophytes in the thoracic spine consistent with diffuse idiopathic skeletal hyperostosis. There is overall mild multilevel facet arthropathy. There is no evidence of significant spinal canal or neural foraminal stenosis. CT LUMBAR SPINE FINDINGS Segmentation: Standard; the lowest formed disc space is designated L5-S1. Alignment: Normal. There is no jumped or perched facet or other evidence of traumatic malalignment. Vertebrae: Vertebral body heights are preserved. There is no evidence of acute fracture. There is no suspicious osseous lesion. Postsurgical changes reflecting posterior instrumented fusion and decompression there is  solid fusion across the disc spaces and posterior elements. From L3 through S1 are noted. There is no evidence of hardware related complication. Paraspinal and other soft tissues: There is a 0.8 cm nonobstructing right renal stone. A left upper pole renal cyst is noted requiring no specific imaging follow-up. Disc levels: T12-L1: There is a partially calcified disc protrusion resulting mild spinal canal stenosis. No significant neural foraminal stenosis L1-L2: No significant spinal canal or neural foraminal stenosis L2-L3: There is a disc bulge, degenerative endplate spurring, and mild facet arthropathy resulting in severe spinal canal stenosis and mild left and no significant right neural foraminal stenosis L3-L4: There is a disc bulge, degenerative endplate spurring, and moderate bilateral facet arthropathy resulting severe spinal canal stenosis and moderate to severe bilateral neural foraminal stenosis L4-L5: Status post posterior instrumented fusion and decompression without convincing evidence of significant spinal canal or neural foraminal stenosis L5-S1: Status post posterior instrumented fusion and decompression. Endplate spurring resulting in mild-to-moderate bilateral neural foraminal stenosis. No evidence of significant spinal canal stenosis IMPRESSION: 1. No evidence of acute fracture or traumatic malalignment of the cervical, thoracic, or lumbar spine. 2. Postsurgical changes in the cervical and lumbar spine as above without evidence of complication. 3. Multifactorial severe spinal canal stenosis at L2-L3 and L3-L4 and moderate to severe bilateral neural foraminal stenosis at L3-L4. 4. 0.8 cm nonobstructing right renal stone. Electronically Signed   By: Valetta Mole M.D.   On: 06/04/2022 08:34   CT Lumbar Spine Wo Contrast  Result Date: 06/04/2022 CLINICAL DATA:  Back pain after a fall EXAM: CT CERVICAL, THORACIC, AND LUMBAR SPINE WITHOUT CONTRAST TECHNIQUE: Multidetector CT imaging of the cervical,  thoracic and lumbar spine was performed without intravenous contrast. Multiplanar CT image reconstructions were also generated. RADIATION DOSE REDUCTION: This exam was performed according to the departmental dose-optimization program which includes automated exposure control, adjustment of the mA and/or kV according to patient size and/or use of iterative reconstruction technique. COMPARISON:  Cervical spine CT 11/14/2017, CT abdomen/pelvis 12/07/2016, lumbar spine MRI 11/13/2015, two-view chest radiograph 11/08/2015 FINDINGS: CT CERVICAL SPINE FINDINGS Alignment: Normal. There is no antero or retrolisthesis. There is no jumped or perched facet or other evidence of traumatic malalignment. Skull base and vertebrae: Skull base alignment is maintained. Vertebral body heights are preserved. There is no evidence of acute fracture. There is no suspicious osseous lesion postsurgical changes reflecting C5 through C7 ACDF are noted. Hardware alignment is within expected limits, without evidence of complication. There is solid fusion across the disc spaces. Soft tissues and spinal canal: No prevertebral fluid or swelling. No visible canal hematoma. Disc levels: There is mild degenerative change at the C1-C2 articulation  with degenerative pannus about the dens. There is no evidence of significant narrowing of the craniocervical junction. There is multilevel facet arthropathy most advanced on the left at C2-C3 and C3-C4. There is no evidence of high-grade spinal canal or neural foraminal stenosis. CT THORACIC SPINE FINDINGS Alignment: Normal. There is no jumped or perched facet or other evidence of traumatic malalignment. Vertebrae: Vertebral body heights are preserved. There is no evidence of acute fracture. There is no suspicious osseous lesion. Paraspinal and other soft tissues: Unremarkable. Disc levels: There is multilevel degenerative endplate change with flowing anterior osteophytes in the thoracic spine consistent with  diffuse idiopathic skeletal hyperostosis. There is overall mild multilevel facet arthropathy. There is no evidence of significant spinal canal or neural foraminal stenosis. CT LUMBAR SPINE FINDINGS Segmentation: Standard; the lowest formed disc space is designated L5-S1. Alignment: Normal. There is no jumped or perched facet or other evidence of traumatic malalignment. Vertebrae: Vertebral body heights are preserved. There is no evidence of acute fracture. There is no suspicious osseous lesion. Postsurgical changes reflecting posterior instrumented fusion and decompression there is solid fusion across the disc spaces and posterior elements. From L3 through S1 are noted. There is no evidence of hardware related complication. Paraspinal and other soft tissues: There is a 0.8 cm nonobstructing right renal stone. A left upper pole renal cyst is noted requiring no specific imaging follow-up. Disc levels: T12-L1: There is a partially calcified disc protrusion resulting mild spinal canal stenosis. No significant neural foraminal stenosis L1-L2: No significant spinal canal or neural foraminal stenosis L2-L3: There is a disc bulge, degenerative endplate spurring, and mild facet arthropathy resulting in severe spinal canal stenosis and mild left and no significant right neural foraminal stenosis L3-L4: There is a disc bulge, degenerative endplate spurring, and moderate bilateral facet arthropathy resulting severe spinal canal stenosis and moderate to severe bilateral neural foraminal stenosis L4-L5: Status post posterior instrumented fusion and decompression without convincing evidence of significant spinal canal or neural foraminal stenosis L5-S1: Status post posterior instrumented fusion and decompression. Endplate spurring resulting in mild-to-moderate bilateral neural foraminal stenosis. No evidence of significant spinal canal stenosis IMPRESSION: 1. No evidence of acute fracture or traumatic malalignment of the cervical,  thoracic, or lumbar spine. 2. Postsurgical changes in the cervical and lumbar spine as above without evidence of complication. 3. Multifactorial severe spinal canal stenosis at L2-L3 and L3-L4 and moderate to severe bilateral neural foraminal stenosis at L3-L4. 4. 0.8 cm nonobstructing right renal stone. Electronically Signed   By: Valetta Mole M.D.   On: 06/04/2022 08:34   CT Thoracic Spine Wo Contrast  Result Date: 06/04/2022 CLINICAL DATA:  Back pain after a fall EXAM: CT CERVICAL, THORACIC, AND LUMBAR SPINE WITHOUT CONTRAST TECHNIQUE: Multidetector CT imaging of the cervical, thoracic and lumbar spine was performed without intravenous contrast. Multiplanar CT image reconstructions were also generated. RADIATION DOSE REDUCTION: This exam was performed according to the departmental dose-optimization program which includes automated exposure control, adjustment of the mA and/or kV according to patient size and/or use of iterative reconstruction technique. COMPARISON:  Cervical spine CT 11/14/2017, CT abdomen/pelvis 12/07/2016, lumbar spine MRI 11/13/2015, two-view chest radiograph 11/08/2015 FINDINGS: CT CERVICAL SPINE FINDINGS Alignment: Normal. There is no antero or retrolisthesis. There is no jumped or perched facet or other evidence of traumatic malalignment. Skull base and vertebrae: Skull base alignment is maintained. Vertebral body heights are preserved. There is no evidence of acute fracture. There is no suspicious osseous lesion postsurgical changes reflecting C5 through C7  ACDF are noted. Hardware alignment is within expected limits, without evidence of complication. There is solid fusion across the disc spaces. Soft tissues and spinal canal: No prevertebral fluid or swelling. No visible canal hematoma. Disc levels: There is mild degenerative change at the C1-C2 articulation with degenerative pannus about the dens. There is no evidence of significant narrowing of the craniocervical junction. There is  multilevel facet arthropathy most advanced on the left at C2-C3 and C3-C4. There is no evidence of high-grade spinal canal or neural foraminal stenosis. CT THORACIC SPINE FINDINGS Alignment: Normal. There is no jumped or perched facet or other evidence of traumatic malalignment. Vertebrae: Vertebral body heights are preserved. There is no evidence of acute fracture. There is no suspicious osseous lesion. Paraspinal and other soft tissues: Unremarkable. Disc levels: There is multilevel degenerative endplate change with flowing anterior osteophytes in the thoracic spine consistent with diffuse idiopathic skeletal hyperostosis. There is overall mild multilevel facet arthropathy. There is no evidence of significant spinal canal or neural foraminal stenosis. CT LUMBAR SPINE FINDINGS Segmentation: Standard; the lowest formed disc space is designated L5-S1. Alignment: Normal. There is no jumped or perched facet or other evidence of traumatic malalignment. Vertebrae: Vertebral body heights are preserved. There is no evidence of acute fracture. There is no suspicious osseous lesion. Postsurgical changes reflecting posterior instrumented fusion and decompression there is solid fusion across the disc spaces and posterior elements. From L3 through S1 are noted. There is no evidence of hardware related complication. Paraspinal and other soft tissues: There is a 0.8 cm nonobstructing right renal stone. A left upper pole renal cyst is noted requiring no specific imaging follow-up. Disc levels: T12-L1: There is a partially calcified disc protrusion resulting mild spinal canal stenosis. No significant neural foraminal stenosis L1-L2: No significant spinal canal or neural foraminal stenosis L2-L3: There is a disc bulge, degenerative endplate spurring, and mild facet arthropathy resulting in severe spinal canal stenosis and mild left and no significant right neural foraminal stenosis L3-L4: There is a disc bulge, degenerative endplate  spurring, and moderate bilateral facet arthropathy resulting severe spinal canal stenosis and moderate to severe bilateral neural foraminal stenosis L4-L5: Status post posterior instrumented fusion and decompression without convincing evidence of significant spinal canal or neural foraminal stenosis L5-S1: Status post posterior instrumented fusion and decompression. Endplate spurring resulting in mild-to-moderate bilateral neural foraminal stenosis. No evidence of significant spinal canal stenosis IMPRESSION: 1. No evidence of acute fracture or traumatic malalignment of the cervical, thoracic, or lumbar spine. 2. Postsurgical changes in the cervical and lumbar spine as above without evidence of complication. 3. Multifactorial severe spinal canal stenosis at L2-L3 and L3-L4 and moderate to severe bilateral neural foraminal stenosis at L3-L4. 4. 0.8 cm nonobstructing right renal stone. Electronically Signed   By: Valetta Mole M.D.   On: 06/04/2022 08:34   CT HEAD CODE STROKE WO CONTRAST  Result Date: 06/04/2022 CLINICAL DATA:  Code stroke. EXAM: CT HEAD WITHOUT CONTRAST TECHNIQUE: Contiguous axial images were obtained from the base of the skull through the vertex without intravenous contrast. RADIATION DOSE REDUCTION: This exam was performed according to the departmental dose-optimization program which includes automated exposure control, adjustment of the mA and/or kV according to patient size and/or use of iterative reconstruction technique. COMPARISON:  CT head 11/14/2017 FINDINGS: Brain: There is no acute intracranial hemorrhage, extra-axial fluid collection, or acute infarct. Parenchymal volume is normal for age. The ventricles are normal in size. Gray-white differentiation is preserved patchy hypodensity in the supratentorial white  matter likely reflects sequela of chronic small-vessel ischemic change. The pituitary and suprasellar region are normal. There is no mass lesion. There is no mass effect or  midline shift. Vascular: There is calcification of the bilateral carotid siphons and vertebral arteries. No hyperdense vessel is seen. Skull: Choose Sinuses/Orbits: The paranasal sinuses are clear. Bilateral lens implants are in place. The globes and orbits are otherwise unremarkable. Other: None. ASPECTS [facility] Stroke Program Early CT Score) - Ganglionic level infarction (caudate, lentiform nuclei, internal capsule, insula, M1-M3 cortex): 7 - Supraganglionic infarction (M4-M6 cortex): 3 Total score (0-10 with 10 being normal): 10 IMPRESSION: No acute intracranial pathology. Findings communicated to Dr Curly Shores via Shea Evans at 8:11 am. Electronically Signed   By: Valetta Mole M.D.   On: 06/04/2022 08:12     EKG:   Assessment/Plan:   Principal Problem:   Right sided weakness Active Problems:   Weakness of right upper extremity   Patient Summary: [name] is a 77 y.o. person living with a history of chronic back pain with left lower extremity radicular pain, HTN, hypothyroidism, CAD s/p PCI 1999, T2DM, gout, GERD, hearing impairment and depression who presents after a fall out of bed and left arm weakness and admitted for further work up with concern for brachial plexus/cervical injury vs acute CVA   Right upper extremity weakness Recurrent falls Fall from bed Patient presented with right upper extremity weakness after unwitnessed fall from bed and laying on the right arm for an unknown period of time.  MRI brain, C-spine, L-spine obtained today under anesthesia.  EEG unremarkable acute cerebral infarct ruled out.  C-spine MRI does show right neuroforaminal stenosis worst at C5-C6 6, but also C3-C5 as well.  C5-C7 ACDF.  He also has lumbar canal stenosis and neuroforaminal stenosis without signs of cauda equina syndrome.  His deficits primarily seem to be in strength and not sensation and proximal, concerning for possible brachial plexus injury versus C5-C6 radiculopathy.  Next step would be  possible brachial plexus ultrasound, would hesitate to seek anesthesia again for brachial plexus MRI.  Will also formally consult neurosurgery. -Appreciate neurology, neurosurgery recommendations -continue home aspirin, plavix, and atorvastatin -pt/ot consult, currently lives alone and independent in ADL/IADL. If no improvement will need significantly more assistance --neuro checks q4h, worsening neurological symptoms stat head CT -agree with Dr. Curly Shores of neurology, does not seem cardiac in etiology. Hold off on echocardiogram for now -follow up outpatient with PCP/cardiology for suspected PAD and possible contribution to falls   Elevated CK - mild rhabdomyolysis Hematuria Elevated AST CK improving, likely from being down for several hours -Trend CK, LFTs -Will need repeat UA outpatient to follow up hemoglobinuria   Chronic back pain Lumbar post-laminectomy syndrome Acute exacerbation of chronic back pain, no cauda equina -continue tylenol.  Try to avoid opioids if possible, but okay if absolutely necessary -topical analgesics, lidocaine patch - Robaxin-750 3 times daily -frequently move in bed to help with positioning -PT/OT evaluation pending   Pyuria Patient denies urinary symptoms. Afebrile. Does have leukocytosis and UA with large leukocytes, many bacteria, few squamous epithelium, and hemoglobinuria. He received one dose of ceftriaxone in ED. Without urinary symptoms will refrain from treating further and follow cultures drawn by ED. If begins to have fevers, urinary symptoms, or growing significant amount of bacteria with cultures will reinitiate treatment. -continue to monitor for urinary symptoms and systemic signs of infection.    Leukocytosis Improving, no clear infectious symptoms -repeat CBC tomorrow -monitor for fevers  or other systemic symptoms concerning for infectious etiology   Normocytic anemia Thrombocytopenia CTM   CAD Paroxysmal atrial  tachycardia Stable -continue aspirin, plavix, and atorvastatin  -continue carvedilol   T2DM A1c 8.1, CBGs adequately controlled without SSI -A1c pending -if elevated glucose levels can add SSI and basal insulin during admission   Depression -continue sertraline   Hypothyroidism -continue home levothyroxine 50 mcg daily   HTN Normotensive, hold home antihypertensives of losartan and amlodipine -continue to monitor   Gout -does not appear to be on urate lowering therapy   Hearing impairment 2/2 sensorineural hearing loss bilaterally -has hearing aids on admission, follows with otolaryngology   Diet: Heart Healthy IVF: None,None VTE: Enoxaparin Code: Full PT/OT recs: Pending, . TOC recs:  Family Update:   Dispo: Pending management, PT/OT  Linus Galas, MD PGY-1 Internal Medicine Resident Please contact the on call pager after 5 pm and on weekends at [phone].

## 2022-06-06 ENCOUNTER — Encounter (HOSPITAL_COMMUNITY): Payer: Self-pay | Admitting: Radiology

## 2022-06-06 DIAGNOSIS — R296 Repeated falls: Secondary | ICD-10-CM | POA: Diagnosis not present

## 2022-06-06 DIAGNOSIS — R531 Weakness: Secondary | ICD-10-CM | POA: Diagnosis not present

## 2022-06-06 LAB — COMPREHENSIVE METABOLIC PANEL
ALT: 32 U/L (ref 0–44)
AST: 40 U/L (ref 15–41)
Albumin: 2.7 g/dL — ABNORMAL LOW (ref 3.5–5.0)
Alkaline Phosphatase: 49 U/L (ref 38–126)
Anion gap: 9 (ref 5–15)
BUN: 27 mg/dL — ABNORMAL HIGH (ref 8–23)
CO2: 24 mmol/L (ref 22–32)
Calcium: 8.4 mg/dL — ABNORMAL LOW (ref 8.9–10.3)
Chloride: 100 mmol/L (ref 98–111)
Creatinine, Ser: 0.78 mg/dL (ref 0.61–1.24)
GFR, Estimated: >60 mL/min (ref >60)
Glucose, Bld: 201 mg/dL — ABNORMAL HIGH (ref 70–99)
Potassium: 3.8 mmol/L (ref 3.5–5.1)
Sodium: 133 mmol/L — ABNORMAL LOW (ref 135–145)
Total Bilirubin: 1 mg/dL (ref 0.3–1.2)
Total Protein: 6.3 g/dL — ABNORMAL LOW (ref 6.5–8.1)

## 2022-06-06 LAB — CBC
HCT: 36.4 % — ABNORMAL LOW (ref 39.0–52.0)
Hemoglobin: 11.9 g/dL — ABNORMAL LOW (ref 13.0–17.0)
MCH: 30.3 pg (ref 26.0–34.0)
MCHC: 32.7 g/dL (ref 30.0–36.0)
MCV: 92.6 fL (ref 80.0–100.0)
Platelets: 144 10*3/uL — ABNORMAL LOW (ref 150–400)
RBC: 3.93 MIL/uL — ABNORMAL LOW (ref 4.22–5.81)
RDW: 13.2 % (ref 11.5–15.5)
WBC: 11.4 10*3/uL — ABNORMAL HIGH (ref 4.0–10.5)
nRBC: 0 % (ref 0.0–0.2)

## 2022-06-06 LAB — URINE CULTURE: Culture: >=100000 — AB

## 2022-06-06 LAB — CK: Total CK: 548 U/L — ABNORMAL HIGH (ref 49–397)

## 2022-06-06 MED ORDER — TRAMADOL HCL 50 MG PO TABS
50.0000 mg | ORAL_TABLET | Freq: Three times a day (TID) | ORAL | Status: DC | PRN
  Administered 2022-06-06 – 2022-06-10 (×9): 50 mg via ORAL
  Filled 2022-06-06 (×9): qty 1

## 2022-06-06 MED ORDER — DEXAMETHASONE SODIUM PHOSPHATE 10 MG/ML IJ SOLN
10.0000 mg | Freq: Once | INTRAMUSCULAR | Status: AC
  Administered 2022-06-06: 10 mg via INTRAVENOUS
  Filled 2022-06-06: qty 1

## 2022-06-06 MED ORDER — DEXAMETHASONE 4 MG PO TABS
4.0000 mg | ORAL_TABLET | Freq: Every day | ORAL | Status: AC
  Administered 2022-06-07 – 2022-06-09 (×3): 4 mg via ORAL
  Filled 2022-06-06 (×3): qty 1

## 2022-06-06 NOTE — Evaluation (Signed)
Physical Therapy Evaluation Patient Details Name: [name] MRN: [id number] DOB: 1946-02-01 Today's Date: 06/06/2022  History of Present Illness  [name] is a 77 y/o male who presented on 06/04/22 after falling out of bed and having R UE weakness. Imaging neg for acute intracranial abnormality but did show cervical and lumbar spinal stenosis. PMH: chronic back pain with left lower extremity radicular pain, HTN, hypothyroidism, CAD s/p PCI 1999, T2DM, gout, GERD, hearing impairment and depression   Clinical Impression  Pt presents with condition above and deficits mentioned below, see PT Problem List. PTA, he was mod I using a rollator for functional mobility, reportedly walking for up to ~40 minutes at a time. Pt also uses an Transport planner for community mobility. He lives alone in a 1-level apartment with a level entry. Pt can be self-limiting, reporting "I can't" when asked to "try" to move his R arm or assist with all functional mobility, often waiting for therapists to assist him before he engages his muscles to mobilize. In addition, pt demonstrates deficits in bil lower extremity strength, balance, power, and activity tolerance and is also limited by his low back pain. He required maxAx2 to roll, TA x2 to transition sidelying to sit, and TA x2 to initiate transfers to stand with the stedy. Once he was brought to ~1/2 stand he would then actively participate and could stand upright in the stedy with min guard assist. As pt has limited support at d/c, is requiring extensive assistance for functional mobility, and would be unsafe to d/c home alone at this time, recommending short-term rehab at a SNF. Will continue to follow acutely.       Recommendations for follow up therapy are one component of a multi-disciplinary discharge planning process, led by the attending physician.  Recommendations may be updated based on patient status, additional functional criteria and insurance  authorization.  Follow Up Recommendations Skilled nursing-short term rehab (<3 hours/day) Can patient physically be transported by private vehicle: No    Assistance Recommended at Discharge Intermittent Supervision/Assistance  Patient can return home with the following  Two people to help with walking and/or transfers;A lot of help with bathing/dressing/bathroom;Assistance with cooking/housework;Assist for transportation    Equipment Recommendations Other (comment) (defer to next venue of care)  Recommendations for Other Services       Functional Status Assessment Patient has had a recent decline in their functional status and demonstrates the ability to make significant improvements in function in a reasonable and predictable amount of time.     Precautions / Restrictions Precautions Precautions: Fall Precaution Comments: back precautions for comfort only Restrictions Weight Bearing Restrictions: No      Mobility  Bed Mobility Overal bed mobility: Needs Assistance Bed Mobility: Rolling, Sidelying to Sit Rolling: Max assist, +2 for physical assistance, +2 for safety/equipment Sidelying to sit: Total assist, +2 for physical assistance, +2 for safety/equipment, HOB elevated       General bed mobility comments: Pt assisted to bring R leg into flexion to place R foot on bed, poor initiation to reach R UE across body to therapist on his L to roll, maxAx2. TAx2 to bring legs off EOB and ascend trunk with poor initiation by pt.    Transfers Overall transfer level: Needs assistance Equipment used: 2 person hand held assist, Ambulation equipment used Transfers: Sit to/from Stand Sit to Stand: Total assist, +2 physical assistance, +2 safety/equipment, Min assist           General transfer comment: Attempted  to stand from EOB with bil HHA the first rep, but pt minimally if at all initiating to push through legs, thus only cleared buttocks minimally and returned to sit. Changed  approach to using stedy, again poor initiation by pt to stand, relying on therapists to pull him up to at least ~1/2 stand before he initiates extension of legs to stand, TA x2 with 1x from EOB and 1x from recliner to stedy. MinA to stand from stedy flaps 1x. Pt reporting he cannot use his R arm but able to grip the bar of the stedy when assisted in placing his R hand up on it.    Ambulation/Gait               General Gait Details: unable  Stairs            Wheelchair Mobility    Modified Rankin (Stroke Patients Only) Modified Rankin (Stroke Patients Only) Pre-Morbid Rankin Score: Slight disability Modified Rankin: Severe disability     Balance Overall balance assessment: Needs assistance Sitting-balance support: No upper extremity supported, Feet supported Sitting balance-Leahy Scale: Fair Sitting balance - Comments: Static sitting EOB with min guard   Standing balance support: Bilateral upper extremity supported, During functional activity, Reliant on assistive device for balance Standing balance-Leahy Scale: Poor Standing balance comment: Reliant on UE support, TAx2 to initiate coming up to stand from sit but then once pt would activate his legs to stand he could maintain standing in the stedy with min guard assist for >30 sec                             Pertinent Vitals/Pain Pain Assessment Pain Assessment: Faces Faces Pain Scale: Hurts even more Pain Location: chronic back pain Pain Descriptors / Indicators: Grimacing, Discomfort, Guarding, Moaning Pain Intervention(s): Limited activity within patient's tolerance, Monitored during session, Repositioned, Heat applied    Home Living Family/patient expects to be discharged to:: Private residence Living Arrangements: Alone Available Help at Discharge: Family;Friend(s);Available PRN/intermittently Type of Home: Apartment Home Access: Level entry       Home Layout: One level Home Equipment:  Rollator (4 wheels);Grab bars - tub/shower;Electric scooter;Grab bars - toilet;Other (comment) (bedrail; lift chair)      Prior Function Prior Level of Function : Independent/Modified Independent             Mobility Comments: Uses rollator, walking up to ~40 min at a time per pt. Has electric scooter for longer distances in community ADLs Comments: Not driving. Pt managing his own meds with pill box. Friend assists with transportation.     Hand Dominance   Dominant Hand: Right    Extremity/Trunk Assessment   Upper Extremity Assessment Upper Extremity Assessment: Defer to OT evaluation    Lower Extremity Assessment Lower Extremity Assessment: RLE deficits/detail;LLE deficits/detail RLE Deficits / Details: denies numbness/tinging bil; grossly 4- to 4 with MMT RLE Sensation: WNL RLE Coordination: decreased gross motor LLE Deficits / Details: denies numbness/tinging bil; grossly 4- to 4 with MMT LLE Sensation: WNL LLE Coordination: decreased gross motor    Cervical / Trunk Assessment Cervical / Trunk Assessment: Other exceptions Cervical / Trunk Exceptions: chronic low back pain  Communication   Communication: HOH  Cognition Arousal/Alertness: Awake/alert Behavior During Therapy: WFL for tasks assessed/performed Overall Cognitive Status: No family/caregiver present to determine baseline cognitive functioning  General Comments: Oriented, but poor attempts to initiate mobility when cued, often stating "I can't" but then stating "all I can do is try", but reliant on therapists to initiate before pt engages his muscles. Poor understanding that he needs to activate his legs to stand with pt stating he needs his arms to do the work but they cannot due to his R arm weakness.        General Comments      Exercises     Assessment/Plan    PT Assessment Patient needs continued PT services  PT Problem List Decreased  strength;Decreased activity tolerance;Decreased balance;Decreased mobility;Decreased coordination;Pain       PT Treatment Interventions DME instruction;Gait training;Functional mobility training;Therapeutic activities;Therapeutic exercise;Balance training;Neuromuscular re-education;Patient/family education;Wheelchair mobility training    PT Goals (Current goals can be found in the Care Plan section)  Acute Rehab PT Goals Patient Stated Goal: to walk again PT Goal Formulation: With patient Time For Goal Achievement: 06/20/22 Potential to Achieve Goals: Good    Frequency Min 2X/week     Co-evaluation PT/OT/SLP Co-Evaluation/Treatment: Yes Reason for Co-Treatment: For patient/therapist safety;To address functional/ADL transfers PT goals addressed during session: Mobility/safety with mobility;Balance         AM-PAC PT "6 Clicks" Mobility  Outcome Measure Help needed turning from your back to your side while in a flat bed without using bedrails?: Total Help needed moving from lying on your back to sitting on the side of a flat bed without using bedrails?: Total Help needed moving to and from a bed to a chair (including a wheelchair)?: Total Help needed standing up from a chair using your arms (e.g., wheelchair or bedside chair)?: Total Help needed to walk in hospital room?: Total Help needed climbing 3-5 steps with a railing? : Total 6 Click Score: 6    End of Session Equipment Utilized During Treatment: Gait belt Activity Tolerance: Other (comment) (can be self-limiting) Patient left: in chair;with call bell/phone within reach;with chair alarm set;Other (comment) (with heat applied to low back) Nurse Communication: Mobility status;Need for lift equipment PT Visit Diagnosis: Unsteadiness on feet (R26.81);Other abnormalities of gait and mobility (R26.89);History of falling (Z91.81);Muscle weakness (generalized) (M62.81);Difficulty in walking, not elsewhere classified  (R26.2);Pain Pain - part of body:  (back)    Time: [id number] PT Time Calculation (min) (ACUTE ONLY): 35 min   Charges:   PT Evaluation $PT Eval Moderate Complexity: 1 Mod          Moishe Spice, PT, DPT Acute Rehabilitation Services  Office: [phone]   Orvan Falconer 06/06/2022, 11:53 AM

## 2022-06-06 NOTE — Progress Notes (Signed)
Subjective:   Summary: [name] is a 77 y.o. year old male currently admitted on the IMTS HD#2 for right arm weakness.  Overnight Events:  Neurosurgery assessed yesterday, no emergent intervention planned.  Some improvement today in RUE weakness. Still has persistent chronic back pain. No other acute concerns. Discussed that unfortunately Brachial plexus/shoulder mri was not obtained with initial imaging evaluation. Will discuss with radiology about next steps for imaging, he is very hesitant to be put under anesthesia again for repeat MRI.   Update: Discussed case with radiology unfortunately there is no great alternative for MRI of shoulder and leg brachial plexus if injury there is suspected.  CT might elucidate masses or fractures/inflammation but does not have similar soft tissue resolution to MRI.  MSK ultrasound is unfortunately not an option and would not be sufficient evaluate if surgical intervention was necessary. Went back and discussed with him he is if he is willing to undergo another MRI.  Discussed options for sedation including benzos and he says he has tried them before for MRIs without success.  He requires general anesthesia for MRI.  After long conversation regarding risks and benefits, he has decided that he is willing to undergo general anesthesia again for MRI shoulder/brachial plexus.  Will continue conservative management in the meantime.    Objective:  Vital signs in last 24 hours: Vitals:   06/05/22 1538 06/05/22 2000 06/05/22 2319 06/06/22 0334  BP: (!) 142/75 (!) 115/54 116/61 (!) 143/62  Pulse: 75 68 65 64  Resp: '18 20 18 18  '$ Temp: 98.2 F (36.8 C) 98.4 F (36.9 C) 98.5 F (36.9 C) 99.1 F (37.3 C)  TempSrc: Oral Oral Oral Oral  SpO2: 100% 96% 96% 95%  Weight:      Height:       Supplemental O2: Room Air    Physical Exam:  Constitutional: Elderly appearing, NAD Cardiovascular: RRR, no murmurs, rubs or  gallops Pulmonary/Chest: normal work of breathing on room air, lungs clear to auscultation bilaterally Abdominal: soft, non-tender, non-distended Skin: warm and dry Extremities: upper/lower extremity pulses 2+ MSK: Tenderness palpation over right lumbar paraspinal muscles.  No acute step-off over spine or tenderness over spine. Neuro: Alert and oriented x 3.  EOMI.  Cranial nerves grossly intact.  Sensation intact.  2/5 strength in right shoulder, 2/5 strength right elbow flexion, right forearm supination.  3/5 strength right hand/grip.  5/5 strength left upper extremity.  5/5 strength bilateral lower extremities.     Filed Weights   06/04/22 0700 06/05/22 0828  Weight: 114.2 kg 108.9 kg     Intake/Output Summary (Last 24 hours) at 06/06/2022 0746 Last data filed at 06/06/2022 0336 Gross per 24 hour  Intake 1100 ml  Output 650 ml  Net 450 ml    Net IO Since Admission: 570 mL [06/06/22 0746]  Pertinent Labs:    Latest Ref Rng & Units 06/05/2022    5:10 AM 06/04/2022    8:00 AM 06/04/2022    7:54 AM  CBC  WBC 4.0 - 10.5 K/uL 12.0   16.6   Hemoglobin 13.0 - 17.0 g/dL 11.8  11.9  12.5   Hematocrit 39.0 - 52.0 % 35.0  35.0  36.0   Platelets 150 - 400 K/uL 126   145        Latest Ref Rng & Units 06/05/2022    5:10 AM 06/04/2022  8:00 AM 06/04/2022    7:54 AM  CMP  Glucose 70 - 99 mg/dL 153  177  176   BUN 8 - 23 mg/dL '22  29  25   '$ Creatinine 0.61 - 1.24 mg/dL 0.88  0.80  0.94   Sodium 135 - 145 mmol/L 134  136  133   Potassium 3.5 - 5.1 mmol/L 3.4  3.8  3.9   Chloride 98 - 111 mmol/L 101  104  100   CO2 22 - 32 mmol/L 22   20   Calcium 8.9 - 10.3 mg/dL 8.5   8.9   Total Protein 6.5 - 8.1 g/dL 5.7   6.3   Total Bilirubin 0.3 - 1.2 mg/dL 1.3   1.5   Alkaline Phos 38 - 126 U/L 45   45   AST 15 - 41 U/L 63   92   ALT 0 - 44 U/L 33   35   CK 548 A1c 8.1  Imaging: MR Cervical Spine Wo Contrast  Result Date: 06/05/2022 CLINICAL DATA:  RUE weakness, neck pain; RUE  weakness; back pain sp fall, weakness EXAM: MRI HEAD WITHOUT CONTRAST MRI CERVICAL AND LUMBAR SPINE WITHOUT CONTRAST TECHNIQUE: Multiplanar, multiecho pulse sequences of the brain and surrounding structures were obtained without contrast. Multiplanar and multiecho pulse sequences of the cervical spine, to include the craniocervical junction and cervicothoracic junction, and thoracic and lumbar spine, were obtained without intravenous contrast. COMPARISON:  MRI lumbar spine 11/13/2015. CT of the cervical spine June 04, 2022. FINDINGS: MRI HEAD FINDINGS Brain: No evidence of acute infarct, acute hemorrhage, mass lesion or midline shift. No hydrocephalus. Patchy T2/FLAIR hyperintensities within the white matter, compatible with chronic microvascular ischemic disease that is mild for age. Vascular: Major arterial flow voids are maintained at the skull base. Skull base and calvarium: Normal marrow signal. Sinuses/orbits: No acute findings. MRI CERVICAL SPINE FINDINGS Alignment: No substantial sagittal subluxation. Vertebrae: Vertebral body heights are maintained. C5-C7 ACDF. No focal marrow edema to suggest acute fracture or discitis/osteomyelitis. No suspicious lesions. Cord: Normal cord signal. Posterior Fossa, vertebral arteries, paraspinal tissues: Vertebral artery flow voids are maintained. No acute findings. Disc levels: C2-C3: Left eccentric mild disc bulging/endplate spurring and facet/uncovertebral hypertrophy. No significant stenosis. C3-C4: Small posterior disc osteophyte complex with left greater than right facet and uncovertebral hypertrophy. Resulting moderate left foraminal stenosis. Patent canal. Borderline mild canal stenosis. C4-C5: Posterior disc osteophyte complex. Ligamentum flavum thickening. Bilateral facet uncovertebral hypertrophy. Mild bilateral foraminal stenosis. Mild to moderate canal stenosis. C5-C6: ACDF. Patent canal. Moderate to severe right foraminal stenosis due to facet  uncovertebral hypertrophy. Patent left foramen. C6-C7: Posterior disc osteophyte complex. Patent canal and foramina. C7-T1: Facet arthropathy.  No significant stenosis. MRI LUMBAR SPINE FINDINGS Segmentation:  Standard segmentation is assumed. Alignment:  No substantial sagittal subluxation.  L4-S1 PLIF. Vertebrae: In degenerative/discogenic endplate signal changes at L3-L4 with disc edema which likely relates to degenerative change. This is adjacent level to the PLIF. Otherwise, no focal marrow edema or suspicious bone lesion. Conus medullaris and cauda equina: Conus extends to the L1 level. Conus appears normal. Paraspinal and other soft tissues: Partially imaged left renal cyst. Disc levels: T12-L1: Disc bulging. Bilateral facet arthropathy. No significant stenosis. L1-L2: Bilateral facet arthropathy.  No significant stenosis. L2-L3: Disc bulging and bilateral facet arthropathy with ligamentum flavum thickening prominent dorsal epidural fat. Resulting moderate canal stenosis. Mild left foraminal stenosis. Findings are progressed. L3-L4: Posterior disc bulging and endplate spurring. Severe bilateral facet arthropathy. Resulting moderate  left greater than right foraminal stenosis. The canal is patent status post decompression. L4-L5: PLIF.  Posterior decompression.  Patent canal and foramina. L5-S1: PLIF and posterior decompression.  Patent canal and foramina. IMPRESSION: MRI head: 1. No acute abnormality. 2. 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 MRI cervical spine: 1. At C5-C6, moderate to severe right foraminal stenosis which is not substantially changed 2. At C3-C4, mild to moderate left foraminal stenosis which is progressed. 3. At C4-C5, mild to moderate canal stenosis with mild bilateral foraminal stenosis which is progressed. 4. C5-C7 ACDF. MRI lumbar spine: 1. At L2-L3, progressive moderate canal stenosis with left subarticular recess narrowing and mild left foraminal stenosis. 2. At L3-L4,  mildly progressive moderate left greater than right foraminal stenosis. Subarticular recess narrowing at this level with patent canal status post decompression. 3. L4-S1 PLIF with patent canal and foramina at these levels. Electronically Signed   By: Margaretha Sheffield M.D.   On: 06/05/2022 13:29   MR LUMBAR SPINE WO CONTRAST  Result Date: 06/05/2022 CLINICAL DATA:  RUE weakness, neck pain; RUE weakness; back pain sp fall, weakness EXAM: MRI HEAD WITHOUT CONTRAST MRI CERVICAL AND LUMBAR SPINE WITHOUT CONTRAST TECHNIQUE: Multiplanar, multiecho pulse sequences of the brain and surrounding structures were obtained without contrast. Multiplanar and multiecho pulse sequences of the cervical spine, to include the craniocervical junction and cervicothoracic junction, and thoracic and lumbar spine, were obtained without intravenous contrast. COMPARISON:  MRI lumbar spine 11/13/2015. CT of the cervical spine June 04, 2022. FINDINGS: MRI HEAD FINDINGS Brain: No evidence of acute infarct, acute hemorrhage, mass lesion or midline shift. No hydrocephalus. Patchy T2/FLAIR hyperintensities within the white matter, compatible with chronic microvascular ischemic disease that is mild for age. Vascular: Major arterial flow voids are maintained at the skull base. Skull base and calvarium: Normal marrow signal. Sinuses/orbits: No acute findings. MRI CERVICAL SPINE FINDINGS Alignment: No substantial sagittal subluxation. Vertebrae: Vertebral body heights are maintained. C5-C7 ACDF. No focal marrow edema to suggest acute fracture or discitis/osteomyelitis. No suspicious lesions. Cord: Normal cord signal. Posterior Fossa, vertebral arteries, paraspinal tissues: Vertebral artery flow voids are maintained. No acute findings. Disc levels: C2-C3: Left eccentric mild disc bulging/endplate spurring and facet/uncovertebral hypertrophy. No significant stenosis. C3-C4: Small posterior disc osteophyte complex with left greater than right  facet and uncovertebral hypertrophy. Resulting moderate left foraminal stenosis. Patent canal. Borderline mild canal stenosis. C4-C5: Posterior disc osteophyte complex. Ligamentum flavum thickening. Bilateral facet uncovertebral hypertrophy. Mild bilateral foraminal stenosis. Mild to moderate canal stenosis. C5-C6: ACDF. Patent canal. Moderate to severe right foraminal stenosis due to facet uncovertebral hypertrophy. Patent left foramen. C6-C7: Posterior disc osteophyte complex. Patent canal and foramina. C7-T1: Facet arthropathy.  No significant stenosis. MRI LUMBAR SPINE FINDINGS Segmentation:  Standard segmentation is assumed. Alignment:  No substantial sagittal subluxation.  L4-S1 PLIF. Vertebrae: In degenerative/discogenic endplate signal changes at L3-L4 with disc edema which likely relates to degenerative change. This is adjacent level to the PLIF. Otherwise, no focal marrow edema or suspicious bone lesion. Conus medullaris and cauda equina: Conus extends to the L1 level. Conus appears normal. Paraspinal and other soft tissues: Partially imaged left renal cyst. Disc levels: T12-L1: Disc bulging. Bilateral facet arthropathy. No significant stenosis. L1-L2: Bilateral facet arthropathy.  No significant stenosis. L2-L3: Disc bulging and bilateral facet arthropathy with ligamentum flavum thickening prominent dorsal epidural fat. Resulting moderate canal stenosis. Mild left foraminal stenosis. Findings are progressed. L3-L4: Posterior disc bulging and endplate spurring. Severe bilateral facet arthropathy. Resulting moderate left greater  than right foraminal stenosis. The canal is patent status post decompression. L4-L5: PLIF.  Posterior decompression.  Patent canal and foramina. L5-S1: PLIF and posterior decompression.  Patent canal and foramina. IMPRESSION: MRI head: 1. No acute abnormality. 2. 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 MRI cervical spine: 1. At C5-C6, moderate to severe right foraminal  stenosis which is not substantially changed 2. At C3-C4, mild to moderate left foraminal stenosis which is progressed. 3. At C4-C5, mild to moderate canal stenosis with mild bilateral foraminal stenosis which is progressed. 4. C5-C7 ACDF. MRI lumbar spine: 1. At L2-L3, progressive moderate canal stenosis with left subarticular recess narrowing and mild left foraminal stenosis. 2. At L3-L4, mildly progressive moderate left greater than right foraminal stenosis. Subarticular recess narrowing at this level with patent canal status post decompression. 3. L4-S1 PLIF with patent canal and foramina at these levels. Electronically Signed   By: Margaretha Sheffield M.D.   On: 06/05/2022 13:29   MR BRAIN WO CONTRAST  Result Date: 06/05/2022 CLINICAL DATA:  RUE weakness, neck pain; RUE weakness; back pain sp fall, weakness EXAM: MRI HEAD WITHOUT CONTRAST MRI CERVICAL AND LUMBAR SPINE WITHOUT CONTRAST TECHNIQUE: Multiplanar, multiecho pulse sequences of the brain and surrounding structures were obtained without contrast. Multiplanar and multiecho pulse sequences of the cervical spine, to include the craniocervical junction and cervicothoracic junction, and thoracic and lumbar spine, were obtained without intravenous contrast. COMPARISON:  MRI lumbar spine 11/13/2015. CT of the cervical spine June 04, 2022. FINDINGS: MRI HEAD FINDINGS Brain: No evidence of acute infarct, acute hemorrhage, mass lesion or midline shift. No hydrocephalus. Patchy T2/FLAIR hyperintensities within the white matter, compatible with chronic microvascular ischemic disease that is mild for age. Vascular: Major arterial flow voids are maintained at the skull base. Skull base and calvarium: Normal marrow signal. Sinuses/orbits: No acute findings. MRI CERVICAL SPINE FINDINGS Alignment: No substantial sagittal subluxation. Vertebrae: Vertebral body heights are maintained. C5-C7 ACDF. No focal marrow edema to suggest acute fracture or  discitis/osteomyelitis. No suspicious lesions. Cord: Normal cord signal. Posterior Fossa, vertebral arteries, paraspinal tissues: Vertebral artery flow voids are maintained. No acute findings. Disc levels: C2-C3: Left eccentric mild disc bulging/endplate spurring and facet/uncovertebral hypertrophy. No significant stenosis. C3-C4: Small posterior disc osteophyte complex with left greater than right facet and uncovertebral hypertrophy. Resulting moderate left foraminal stenosis. Patent canal. Borderline mild canal stenosis. C4-C5: Posterior disc osteophyte complex. Ligamentum flavum thickening. Bilateral facet uncovertebral hypertrophy. Mild bilateral foraminal stenosis. Mild to moderate canal stenosis. C5-C6: ACDF. Patent canal. Moderate to severe right foraminal stenosis due to facet uncovertebral hypertrophy. Patent left foramen. C6-C7: Posterior disc osteophyte complex. Patent canal and foramina. C7-T1: Facet arthropathy.  No significant stenosis. MRI LUMBAR SPINE FINDINGS Segmentation:  Standard segmentation is assumed. Alignment:  No substantial sagittal subluxation.  L4-S1 PLIF. Vertebrae: In degenerative/discogenic endplate signal changes at L3-L4 with disc edema which likely relates to degenerative change. This is adjacent level to the PLIF. Otherwise, no focal marrow edema or suspicious bone lesion. Conus medullaris and cauda equina: Conus extends to the L1 level. Conus appears normal. Paraspinal and other soft tissues: Partially imaged left renal cyst. Disc levels: T12-L1: Disc bulging. Bilateral facet arthropathy. No significant stenosis. L1-L2: Bilateral facet arthropathy.  No significant stenosis. L2-L3: Disc bulging and bilateral facet arthropathy with ligamentum flavum thickening prominent dorsal epidural fat. Resulting moderate canal stenosis. Mild left foraminal stenosis. Findings are progressed. L3-L4: Posterior disc bulging and endplate spurring. Severe bilateral facet arthropathy. Resulting  moderate left greater than right  foraminal stenosis. The canal is patent status post decompression. L4-L5: PLIF.  Posterior decompression.  Patent canal and foramina. L5-S1: PLIF and posterior decompression.  Patent canal and foramina. IMPRESSION: MRI head: 1. No acute abnormality. 2. 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 MRI cervical spine: 1. At C5-C6, moderate to severe right foraminal stenosis which is not substantially changed 2. At C3-C4, mild to moderate left foraminal stenosis which is progressed. 3. At C4-C5, mild to moderate canal stenosis with mild bilateral foraminal stenosis which is progressed. 4. C5-C7 ACDF. MRI lumbar spine: 1. At L2-L3, progressive moderate canal stenosis with left subarticular recess narrowing and mild left foraminal stenosis. 2. At L3-L4, mildly progressive moderate left greater than right foraminal stenosis. Subarticular recess narrowing at this level with patent canal status post decompression. 3. L4-S1 PLIF with patent canal and foramina at these levels. Electronically Signed   By: Margaretha Sheffield M.D.   On: 06/05/2022 13:29     EKG:   Assessment/Plan:   Principal Problem:   Right sided weakness Active Problems:   Weakness of right upper extremity   Patient Summary: BEAUMONT WHITENIGHT is a 77 y.o. person living with a history of chronic back pain with left lower extremity radicular pain, HTN, hypothyroidism, CAD s/p PCI 1999, T2DM, gout, GERD, hearing impairment and depression who presents after a fall out of bed and left arm weakness and admitted for further work up with concern for brachial plexus/cervical injury vs acute CVA   Right upper extremity weakness Recurrent falls Fall from bed RUE strength slightly improved today, Still no sensory deficits. His C spine radiculopathy would not explain his acute symptoms, which more resemble upper trunk brachial plexus injury or other peripheral poly neuropathy. Unfortunately, brachial plexus/shoulder  MRI not obtained with initial evaluation, but he is willing to be put under anesthesia again for MRI of right shoulder and right brachial plexus.  Will trial steroids for possible brachial plexus inflammation to see if he has improvement. -Appreciate neurology, neurosurgery recommendations - MRI right shoulder/right brachial plexus.  He will need to be n.p.o. nightly in anticipation of anesthesia.  Unsure exactly when this will be scheduled.  Can restart diet in the morning if he is not going to get MRI of head today. -decadron 10 mg x 1 and then Decadron 4 mg daily for 3 days. -continue home aspirin, plavix, and atorvastatin -pt/ot consult, currently lives alone and independent in ADL/IADL. If no improvement will need significantly more assistance -follow up outpatient with PCP/cardiology for suspected PAD and possible contribution to falls   Elevated CK - mild rhabdomyolysis Hematuria Elevated AST CK improving, likely from being down for several hours -Trend CK, LFTs -Will need repeat UA outpatient to follow up hemoglobinuria   Chronic back pain Lumbar post-laminectomy syndrome Acute exacerbation of chronic back pain, no cauda equina -continue tylenol. Adding tramadol '50mg'$  q8h today. Try to avoid opioids if possible, but okay if absolutely necessary -topical analgesics, lidocaine patch - Robaxin-750 3 times daily - Steroids as above -frequently move in bed to help with positioning -PT/OT evaluation pending   Pyuria Patient denies urinary symptoms. Afebrile. Does have leukocytosis and UA with large leukocytes, many bacteria, few squamous epithelium, and hemoglobinuria. He received one dose of ceftriaxone in ED. Without urinary symptoms will refrain from treating further and follow cultures drawn by ED. If begins to have fevers, urinary symptoms, or growing significant amount of bacteria with cultures will reinitiate treatment. -continue to monitor for urinary symptoms and systemic signs  of infection.    Leukocytosis Improving, no clear infectious symptoms -repeat CBC tomorrow -monitor for fevers or other systemic symptoms concerning for infectious etiology   Normocytic anemia Thrombocytopenia CTM   CAD Paroxysmal atrial tachycardia Stable -continue aspirin, plavix, and atorvastatin  -continue carvedilol   T2DM A1c 8.1, CBGs adequately controlled without SSI. 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 at midnight IVF: None,None VTE: Enoxaparin Code: Full PT/OT recs: Pending, . TOC recs:  Family Update:   Dispo: Pending management, PT/OT  Linus Galas, MD PGY-1 Internal Medicine Resident Please contact the on call pager after 5 pm and on weekends at [phone].

## 2022-06-06 NOTE — Progress Notes (Signed)
Neurology Progress Note  Brief HPI: [name] is a 77 y.o. male with a past medical history significant for hypertension, hyperlipidemia, type 2 diabetes, hypothyroidism, coronary artery disease s/p stent, tuberculosis, obesity (BMI 37.18), cervical spine fusion, lumbar spine fusion, chronic pain on chronic opiates, anxiety/depression, paroxysmal atrial tachycardia, NSVT, smoking   He reports he had some slight chills and shivering on 2/20 but denies any other infectious symptoms other than mild nausea.  He had a slight headache when he went to bed at 7 PM on 2/20 and was having more than normal chronic lower back pain.  He awoke at 3 AM 2/21, thinks he had fallen out of bed and may have fallen on his right arm. He did have to call EMS to assist him back up.  He was able to ambulate to his lift assist chair, but did note he was having some right upper extremity weakness.  This has happened previously (last approximately 1 month ago, not as severe and did not last as long, again thinks he may have laid on it too long) and he thought it might resolve. He was also having difficulty getting out of the chair due to low back pain.  Eventually he was able to get up out of his chair but fell again, activating EMS again.  Due to his persistent right upper extremity weakness they activated code stroke and brought him to [facility] for evaluation   Subjective: Patient seen in hallway on the way to his MRI under anesthesia. Unable to move right arm on brief exam.   Exam: Vitals:   06/06/22 0805 06/06/22 1159  BP: (!) 141/63 132/64  Pulse: 64 64  Resp: 18 18  Temp: 98.1 F (36.7 C) 98.4 F (36.9 C)  SpO2: 96% 98%   Gen: In bed, NAD Resp: non-labored breathing, no acute distress Abd: soft, nt  Neuro: Mental Status: Patient is awake, alert, oriented to person, place, month, age, and situation. Patient is able to give a clear and coherent history. No signs of aphasia or neglect Cranial  Nerves: II: Visual Fields are full. Pupils are equal, round, and reactive to light.   III,IV, VI: EOMI without diploplia, some left eye ptosis secondary to edema.  V: Facial sensation is symmetric to temperature VII: Facial movement is notable for slight right facial droop.  VIII: Hard of hearing at baseline. Hearing aides in place X: Uvula elevates symmetrically XI: Shoulder shrug is symmetric. XII: tongue is midline without atrophy or fasciculations.  Motor: Examination is very effort/pain limited Moves lower extremities to gravity bilaterally but unable to keep them elevated off the bed.  RUE distally FE 4/5, FF 4/5, WF 4/5, WE 4/5, EF poor effort, at least 1/5, EE at least 2/5, reported unable to move right shoulder but subsequently spontaneously shrugging right shoulder when speaking to examiner Sensory: Sensation is symmetric to light tough in upper extremities bilaterally, reports sensory deficit on admission has resolved  Deep Tendon Reflexes: Absent right upper extremity biceps and brachioradialis, trace triceps reflex.  On the left side he has diminished but at least 1+ biceps, brachioradialis and triceps.   Pertinent Labs: K3.4 CK 4751 -> 1545 WBC 16.6 -> 12.0  Imaging Reviewed: CT Brain CT C/T/L-spine 1. No evidence of acute fracture or traumatic malalignment of the cervical, thoracic, or lumbar spine. 2. Postsurgical changes in the cervical and lumbar spine as above without evidence of complication. 3. Multifactorial severe spinal canal stenosis at L2-L3 and L3-L4 and moderate  to severe bilateral neural foraminal stenosis at L3-L4. 4. 0.8 cm nonobstructing right renal stone.  MRI head: 1. No acute abnormality. 2. 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   MRI cervical spine: 1. At C5-C6, moderate to severe right foraminal stenosis which is not substantially changed 2. At C3-C4, mild to moderate left foraminal stenosis which is progressed. 3. At C4-C5,  mild to moderate canal stenosis with mild bilateral foraminal stenosis which is progressed. 4. C5-C7 ACDF.   MRI lumbar spine: 1. At L2-L3, progressive moderate canal stenosis with left subarticular recess narrowing and mild left foraminal stenosis. 2. At L3-L4, mildly progressive moderate left greater than right foraminal stenosis. Subarticular recess narrowing at this level with patent canal status post decompression.  3. L4-S1 PLIF with patent canal and foramina at these levels.  Assessment:  77 y.o. male presenting after a fall with right upper extremity weakness.  He initially presented on 2/21 as a code stroke after 2 falls and persistent right upper extremity weakness. Continues to have functional overlay on exam with me and on physical therapy notes. Given resolution of sensory symptoms without resolution of motor symptoms, patient is unlikely to have significant peripheral nerve injury.  However if symptoms do not fully resolve EMG/nerve conduction study can be considered on an outpatient basis (changes take 2 to 3 weeks to be appreciated)   Impression:  Unlikely significant peripheral nerve injury No acute intracranial process Pain limited and effort limited right upper extremity function  Recommendations: -Evaluation of right shoulder structural injury per primary team/neurosurgery -Inpatient neurology will be available as needed going forward, please reach out if additional questions or concerns arise  Lesleigh Noe MD-PhD [facility] [phone] Available 7 AM to 7 PM, outside these hours please contact Neurologist on call listed on AMION   Greater than 25 minutes spent in the care of this patient, majority at bedside

## 2022-06-06 NOTE — Progress Notes (Signed)
Mobility Specialist: Progress Note   06/06/22 1138  Mobility  Activity Transferred from chair to bed  Level of Assistance +2 (takes two people)  Assistive Device Stedy  Activity Response Tolerated fair  $Mobility charge 1 Mobility   Pt assisted back to bed per request. +2 maxA to stand from the chair. ModA to stand from the stedy seat. Pt back in the bed with call bell and phone at his side. Staff present in the room. Bed alarm is on.   [place] Liann Spaeth Mobility Specialist Please contact via SecureChat or Rehab office at [phone]

## 2022-06-06 NOTE — Consult Note (Signed)
Reason for Consult:right upper weakness Referring Physician: Dareen Piano, nischal  [name] is an 76 y.o. male.  HPI: who fell at home, resulting in bruising to left side of face, and weakness and pain in the right upper extremity and shoulder. Called for opinion regarding weakness in the right upper extremity.   Past Medical History:  Diagnosis Date   Anxiety    Arthritis    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[place]) 03/14/2013   Atypical chest pain 12/28/2013   Chest pain 07/30/2011   1999 Cath - <50% stenosis 2008 NUC - low risk    Chronic back pain    herniated disc   Coronary artery disease    takes Plavix and ASA daily   Depression    takes Prozac daily   Diabetes mellitus type 2, controlled, with complications ([place]) 123XX123   Diverticulitis    hx of   Dizziness    occasionally and states Dr.Smith is aware   DJD (degenerative joint disease)    Essential hypertension, benign 07/30/2011   GERD (gastroesophageal reflux disease)    takes an OTC antacid   Gout    yrs ago and doesn't take any meds   Hard of hearing    wears hearing aids   Headache(784.0)    occasionally   History of colon polyps    benign   History of kidney stones    HNP (herniated nucleus pulposus), lumbar 06/14/2014   Hyperlipidemia    takes Atorvastatin daily   Hypertension    takes Amlodipine,Losartan,and Coreg daily   Hypothyroidism    takes Synthroid daily   IBS (irritable bowel syndrome)    Obesity, unspecified 07/30/2011   Shortness of breath dyspnea    with exertion   Tuberculosis    Patient denies Tb   Type II diabetes mellitus ([place])    takes Metformin daily   Unstable angina ([place]) 12/27/2013   Weakness    numbness and tingling in left leg    Past Surgical History:  Procedure Laterality Date   ANTERIOR CERVICAL DECOMP/DISCECTOMY FUSION  04/2007   C4-5; C6-7   BACK SURGERY     "3 times; last time was screws in lower back 10/2008"    CHOLECYSTECTOMY  1970's   COLONOSCOPY     CORONARY ANGIOPLASTY  1999/2013   1 stent   CYSTOSCOPY     LEFT HEART CATH AND CORONARY ANGIOGRAPHY N/A 09/30/2019   Procedure: LEFT HEART CATH AND CORONARY ANGIOGRAPHY;  Surgeon: Jettie Booze, MD;  Location: [facility];  Service: Cardiovascular;  Laterality: N/A;   LEFT HEART CATHETERIZATION WITH CORONARY ANGIOGRAM N/A 08/21/2011   Procedure: LEFT HEART CATHETERIZATION WITH CORONARY ANGIOGRAM;  Surgeon: Sinclair Grooms, MD;  Location: [facility] CATH LAB;  Service: Cardiovascular;  Laterality: N/A;   LITHOTRIPSY     LUMBAR LAMINECTOMY/DECOMPRESSION MICRODISCECTOMY Left 06/14/2014   Procedure: LEFT LUMBAR THREE-FOUR LANINOTOMY AND MICRODISKECTOMY.;  Surgeon: Hosie Spangle, MD;  Location: [facility];  Service: Neurosurgery;  Laterality: Left;  left   PCI  5/09/2-13   LAD   RADIOLOGY WITH ANESTHESIA N/A 11/13/2015   Procedure: MRI OF LUMBAR SPINE W/WO CONTRAST    (RADIOLOGY WITH ANESTHESIA);  Surgeon: Medication Radiologist, MD;  Location: [facility];  Service: Radiology;  Laterality: N/A;   SHOULDER ARTHROSCOPY W/ ROTATOR CUFF REPAIR  ~ 2011   left    Family History  Problem Relation Age of Onset   Cancer Father  colon   Arthritis Mother    Healthy Sister    Heart attack Brother    Heart disease Brother    Hypertension Brother    Healthy Sister     Social History:  reports that he has quit smoking. His smokeless tobacco use includes chew. He reports that he does not drink alcohol and does not use drugs.  Allergies:  Allergies  Allergen Reactions   Sulfa Antibiotics Rash    Medications: I have reviewed the patient's current medications.  Results for orders placed or performed during the hospital encounter of 06/04/22 (from the past 48 hour(s))  Hemoglobin A1c     Status: Abnormal   Collection Time: 06/05/22  5:10 AM  Result Value Ref Range   Hgb A1c MFr Bld 8.1 (H) 4.8 - 5.6 %    Comment: (NOTE) Pre diabetes:           5.7%-6.4%  Diabetes:              >6.4%  Glycemic control for   <7.0% adults with diabetes    Mean Plasma Glucose 185.77 mg/dL    Comment: Performed at [facility] [address], [place], [place] [address]  Lipid panel     Status: Abnormal   Collection Time: 06/05/22  5:10 AM  Result Value Ref Range   Cholesterol 99 0 - 200 mg/dL   Triglycerides 87 <150 mg/dL   HDL 36 (L) >40 mg/dL   Total CHOL/HDL Ratio 2.8 RATIO   VLDL 17 0 - 40 mg/dL   LDL Cholesterol 46 0 - 99 mg/dL    Comment:        Total Cholesterol/HDL:CHD Risk Coronary Heart Disease Risk Table                     Men   Women  1/2 Average Risk   3.4   3.3  Average Risk       5.0   4.4  2 X Average Risk   9.6   7.1  3 X Average Risk  23.4   11.0        Use the calculated Patient Ratio above and the CHD Risk Table to determine the patient's CHD Risk.        ATP III CLASSIFICATION (LDL):  <100     mg/dL   Optimal  100-129  mg/dL   Near or Above                    Optimal  130-159  mg/dL   Borderline  160-189  mg/dL   High  >190     mg/dL   Very High Performed at [facility] [address], [place], [place] [address]   CK     Status: Abnormal   Collection Time: 06/05/22  5:10 AM  Result Value Ref Range   Total CK 1,545 (H) 49 - 397 U/L    Comment: Performed at [facility], [place] [address] [place],  [address]  CBC with Differential/Platelet     Status: Abnormal   Collection Time: 06/05/22  5:10 AM  Result Value Ref Range   WBC 12.0 (H) 4.0 - 10.5 K/uL   RBC 3.82 (L) 4.22 - 5.81 MIL/uL   Hemoglobin 11.8 (L) 13.0 - 17.0 g/dL   HCT 35.0 (L) 39.0 - 52.0 %   MCV 91.6 80.0 - 100.0 fL   MCH 30.9 26.0 - 34.0 pg  MCHC 33.7 30.0 - 36.0 g/dL   RDW 13.2 11.5 - 15.5 %   Platelets 126 (L) 150 - 400 K/uL    Comment: REPEATED TO VERIFY   nRBC 0.0 0.0 - 0.2 %   Neutrophils Relative % 85 %   Neutro Abs 10.3 (H) 1.7 - 7.7 K/uL   Lymphocytes Relative 6 %   Lymphs Abs 0.7 0.7 - 4.0 K/uL    Monocytes Relative 8 %   Monocytes Absolute 0.9 0.1 - 1.0 K/uL   Eosinophils Relative 0 %   Eosinophils Absolute 0.0 0.0 - 0.5 K/uL   Basophils Relative 0 %   Basophils Absolute 0.0 0.0 - 0.1 K/uL   Immature Granulocytes 1 %   Abs Immature Granulocytes 0.07 0.00 - 0.07 K/uL    Comment: Performed at [facility] [address], [place], [place] [address]  Comprehensive metabolic panel     Status: Abnormal   Collection Time: 06/05/22  5:10 AM  Result Value Ref Range   Sodium 134 (L) 135 - 145 mmol/L   Potassium 3.4 (L) 3.5 - 5.1 mmol/L   Chloride 101 98 - 111 mmol/L   CO2 22 22 - 32 mmol/L   Glucose, Bld 153 (H) 70 - 99 mg/dL    Comment: Glucose reference range applies only to samples taken after fasting for at least 8 hours.   BUN 22 8 - 23 mg/dL   Creatinine, Ser 0.88 0.61 - 1.24 mg/dL   Calcium 8.5 (L) 8.9 - 10.3 mg/dL   Total Protein 5.7 (L) 6.5 - 8.1 g/dL   Albumin 2.8 (L) 3.5 - 5.0 g/dL   AST 63 (H) 15 - 41 U/L   ALT 33 0 - 44 U/L   Alkaline Phosphatase 45 38 - 126 U/L   Total Bilirubin 1.3 (H) 0.3 - 1.2 mg/dL   GFR, Estimated >60 >60 mL/min    Comment: (NOTE) Calculated using the CKD-EPI Creatinine Equation (2021)    Anion gap 11 5 - 15    Comment: Performed at [facility], [place] [address] [place], [place] [address]  Glucose, capillary     Status: Abnormal   Collection Time: 06/05/22  8:31 AM  Result Value Ref Range   Glucose-Capillary 149 (H) 70 - 99 mg/dL    Comment: Glucose reference range applies only to samples taken after fasting for at least 8 hours.  Glucose, capillary     Status: Abnormal   Collection Time: 06/05/22 10:35 AM  Result Value Ref Range   Glucose-Capillary 142 (H) 70 - 99 mg/dL    Comment: Glucose reference range applies only to samples taken after fasting for at least 8 hours.  Glucose, capillary     Status: Abnormal   Collection Time: 06/05/22 12:46 PM  Result Value Ref Range   Glucose-Capillary 151 (H) 70 - 99 mg/dL    Comment:  Glucose reference range applies only to samples taken after fasting for at least 8 hours.  CBC     Status: Abnormal   Collection Time: 06/06/22  7:52 AM  Result Value Ref Range   WBC 11.4 (H) 4.0 - 10.5 K/uL   RBC 3.93 (L) 4.22 - 5.81 MIL/uL   Hemoglobin 11.9 (L) 13.0 - 17.0 g/dL   HCT 36.4 (L) 39.0 - 52.0 %   MCV 92.6 80.0 - 100.0 fL   MCH 30.3 26.0 - 34.0 pg   MCHC 32.7 30.0 - 36.0 g/dL   RDW 13.2 11.5 - 15.5 %  Platelets 144 (L) 150 - 400 K/uL   nRBC 0.0 0.0 - 0.2 %    Comment: Performed at [facility], [place] [address] [place], [place] [address]  Comprehensive metabolic panel     Status: Abnormal   Collection Time: 06/06/22  7:52 AM  Result Value Ref Range   Sodium 133 (L) 135 - 145 mmol/L   Potassium 3.8 3.5 - 5.1 mmol/L   Chloride 100 98 - 111 mmol/L   CO2 24 22 - 32 mmol/L   Glucose, Bld 201 (H) 70 - 99 mg/dL    Comment: Glucose reference range applies only to samples taken after fasting for at least 8 hours.   BUN 27 (H) 8 - 23 mg/dL   Creatinine, Ser 0.78 0.61 - 1.24 mg/dL   Calcium 8.4 (L) 8.9 - 10.3 mg/dL   Total Protein 6.3 (L) 6.5 - 8.1 g/dL   Albumin 2.7 (L) 3.5 - 5.0 g/dL   AST 40 15 - 41 U/L   ALT 32 0 - 44 U/L   Alkaline Phosphatase 49 38 - 126 U/L   Total Bilirubin 1.0 0.3 - 1.2 mg/dL   GFR, Estimated >60 >60 mL/min    Comment: (NOTE) Calculated using the CKD-EPI Creatinine Equation (2021)    Anion gap 9 5 - 15    Comment: Performed at [facility], [place] [address], [place], [place] [address]  CK     Status: Abnormal   Collection Time: 06/06/22  7:52 AM  Result Value Ref Range   Total CK 548 (H) 49 - 397 U/L    Comment: Performed at [facility], [place] [address] [place],  [address]    MR Cervical Spine Wo Contrast  Result Date: 06/05/2022 CLINICAL DATA:  RUE weakness, neck pain; RUE weakness; back pain sp fall, weakness EXAM: MRI HEAD WITHOUT CONTRAST MRI CERVICAL AND LUMBAR SPINE WITHOUT CONTRAST TECHNIQUE: Multiplanar,  multiecho pulse sequences of the brain and surrounding structures were obtained without contrast. Multiplanar and multiecho pulse sequences of the cervical spine, to include the craniocervical junction and cervicothoracic junction, and thoracic and lumbar spine, were obtained without intravenous contrast. COMPARISON:  MRI lumbar spine 11/13/2015. CT of the cervical spine June 04, 2022. FINDINGS: MRI HEAD FINDINGS Brain: No evidence of acute infarct, acute hemorrhage, mass lesion or midline shift. No hydrocephalus. Patchy T2/FLAIR hyperintensities within the white matter, compatible with chronic microvascular ischemic disease that is mild for age. Vascular: Major arterial flow voids are maintained at the skull base. Skull base and calvarium: Normal marrow signal. Sinuses/orbits: No acute findings. MRI CERVICAL SPINE FINDINGS Alignment: No substantial sagittal subluxation. Vertebrae: Vertebral body heights are maintained. C5-C7 ACDF. No focal marrow edema to suggest acute fracture or discitis/osteomyelitis. No suspicious lesions. Cord: Normal cord signal. Posterior Fossa, vertebral arteries, paraspinal tissues: Vertebral artery flow voids are maintained. No acute findings. Disc levels: C2-C3: Left eccentric mild disc bulging/endplate spurring and facet/uncovertebral hypertrophy. No significant stenosis. C3-C4: Small posterior disc osteophyte complex with left greater than right facet and uncovertebral hypertrophy. Resulting moderate left foraminal stenosis. Patent canal. Borderline mild canal stenosis. C4-C5: Posterior disc osteophyte complex. Ligamentum flavum thickening. Bilateral facet uncovertebral hypertrophy. Mild bilateral foraminal stenosis. Mild to moderate canal stenosis. C5-C6: ACDF. Patent canal. Moderate to severe right foraminal stenosis due to facet uncovertebral hypertrophy. Patent left foramen. C6-C7: Posterior disc osteophyte complex. Patent canal and foramina. C7-T1: Facet arthropathy.  No  significant stenosis. MRI LUMBAR SPINE FINDINGS Segmentation:  Standard segmentation is assumed.  Alignment:  No substantial sagittal subluxation.  L4-S1 PLIF. Vertebrae: In degenerative/discogenic endplate signal changes at L3-L4 with disc edema which likely relates to degenerative change. This is adjacent level to the PLIF. Otherwise, no focal marrow edema or suspicious bone lesion. Conus medullaris and cauda equina: Conus extends to the L1 level. Conus appears normal. Paraspinal and other soft tissues: Partially imaged left renal cyst. Disc levels: T12-L1: Disc bulging. Bilateral facet arthropathy. No significant stenosis. L1-L2: Bilateral facet arthropathy.  No significant stenosis. L2-L3: Disc bulging and bilateral facet arthropathy with ligamentum flavum thickening prominent dorsal epidural fat. Resulting moderate canal stenosis. Mild left foraminal stenosis. Findings are progressed. L3-L4: Posterior disc bulging and endplate spurring. Severe bilateral facet arthropathy. Resulting moderate left greater than right foraminal stenosis. The canal is patent status post decompression. L4-L5: PLIF.  Posterior decompression.  Patent canal and foramina. L5-S1: PLIF and posterior decompression.  Patent canal and foramina. IMPRESSION: MRI head: 1. No acute abnormality. 2. 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 MRI cervical spine: 1. At C5-C6, moderate to severe right foraminal stenosis which is not substantially changed 2. At C3-C4, mild to moderate left foraminal stenosis which is progressed. 3. At C4-C5, mild to moderate canal stenosis with mild bilateral foraminal stenosis which is progressed. 4. C5-C7 ACDF. MRI lumbar spine: 1. At L2-L3, progressive moderate canal stenosis with left subarticular recess narrowing and mild left foraminal stenosis. 2. At L3-L4, mildly progressive moderate left greater than right foraminal stenosis. Subarticular recess narrowing at this level with patent canal status post  decompression. 3. L4-S1 PLIF with patent canal and foramina at these levels. Electronically Signed   By: Margaretha Sheffield M.D.   On: 06/05/2022 13:29   MR LUMBAR SPINE WO CONTRAST  Result Date: 06/05/2022 CLINICAL DATA:  RUE weakness, neck pain; RUE weakness; back pain sp fall, weakness EXAM: MRI HEAD WITHOUT CONTRAST MRI CERVICAL AND LUMBAR SPINE WITHOUT CONTRAST TECHNIQUE: Multiplanar, multiecho pulse sequences of the brain and surrounding structures were obtained without contrast. Multiplanar and multiecho pulse sequences of the cervical spine, to include the craniocervical junction and cervicothoracic junction, and thoracic and lumbar spine, were obtained without intravenous contrast. COMPARISON:  MRI lumbar spine 11/13/2015. CT of the cervical spine June 04, 2022. FINDINGS: MRI HEAD FINDINGS Brain: No evidence of acute infarct, acute hemorrhage, mass lesion or midline shift. No hydrocephalus. Patchy T2/FLAIR hyperintensities within the white matter, compatible with chronic microvascular ischemic disease that is mild for age. Vascular: Major arterial flow voids are maintained at the skull base. Skull base and calvarium: Normal marrow signal. Sinuses/orbits: No acute findings. MRI CERVICAL SPINE FINDINGS Alignment: No substantial sagittal subluxation. Vertebrae: Vertebral body heights are maintained. C5-C7 ACDF. No focal marrow edema to suggest acute fracture or discitis/osteomyelitis. No suspicious lesions. Cord: Normal cord signal. Posterior Fossa, vertebral arteries, paraspinal tissues: Vertebral artery flow voids are maintained. No acute findings. Disc levels: C2-C3: Left eccentric mild disc bulging/endplate spurring and facet/uncovertebral hypertrophy. No significant stenosis. C3-C4: Small posterior disc osteophyte complex with left greater than right facet and uncovertebral hypertrophy. Resulting moderate left foraminal stenosis. Patent canal. Borderline mild canal stenosis. C4-C5: Posterior disc  osteophyte complex. Ligamentum flavum thickening. Bilateral facet uncovertebral hypertrophy. Mild bilateral foraminal stenosis. Mild to moderate canal stenosis. C5-C6: ACDF. Patent canal. Moderate to severe right foraminal stenosis due to facet uncovertebral hypertrophy. Patent left foramen. C6-C7: Posterior disc osteophyte complex. Patent canal and foramina. C7-T1: Facet arthropathy.  No significant stenosis. MRI LUMBAR SPINE FINDINGS Segmentation:  Standard segmentation is assumed. Alignment:  No substantial sagittal subluxation.  L4-S1 PLIF. Vertebrae: In degenerative/discogenic endplate signal changes at L3-L4 with disc edema which likely relates to degenerative change. This is adjacent level to the PLIF. Otherwise, no focal marrow edema or suspicious bone lesion. Conus medullaris and cauda equina: Conus extends to the L1 level. Conus appears normal. Paraspinal and other soft tissues: Partially imaged left renal cyst. Disc levels: T12-L1: Disc bulging. Bilateral facet arthropathy. No significant stenosis. L1-L2: Bilateral facet arthropathy.  No significant stenosis. L2-L3: Disc bulging and bilateral facet arthropathy with ligamentum flavum thickening prominent dorsal epidural fat. Resulting moderate canal stenosis. Mild left foraminal stenosis. Findings are progressed. L3-L4: Posterior disc bulging and endplate spurring. Severe bilateral facet arthropathy. Resulting moderate left greater than right foraminal stenosis. The canal is patent status post decompression. L4-L5: PLIF.  Posterior decompression.  Patent canal and foramina. L5-S1: PLIF and posterior decompression.  Patent canal and foramina. IMPRESSION: MRI head: 1. No acute abnormality. 2. 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 MRI cervical spine: 1. At C5-C6, moderate to severe right foraminal stenosis which is not substantially changed 2. At C3-C4, mild to moderate left foraminal stenosis which is progressed. 3. At C4-C5, mild to moderate  canal stenosis with mild bilateral foraminal stenosis which is progressed. 4. C5-C7 ACDF. MRI lumbar spine: 1. At L2-L3, progressive moderate canal stenosis with left subarticular recess narrowing and mild left foraminal stenosis. 2. At L3-L4, mildly progressive moderate left greater than right foraminal stenosis. Subarticular recess narrowing at this level with patent canal status post decompression. 3. L4-S1 PLIF with patent canal and foramina at these levels. Electronically Signed   By: Margaretha Sheffield M.D.   On: 06/05/2022 13:29   MR BRAIN WO CONTRAST  Result Date: 06/05/2022 CLINICAL DATA:  RUE weakness, neck pain; RUE weakness; back pain sp fall, weakness EXAM: MRI HEAD WITHOUT CONTRAST MRI CERVICAL AND LUMBAR SPINE WITHOUT CONTRAST TECHNIQUE: Multiplanar, multiecho pulse sequences of the brain and surrounding structures were obtained without contrast. Multiplanar and multiecho pulse sequences of the cervical spine, to include the craniocervical junction and cervicothoracic junction, and thoracic and lumbar spine, were obtained without intravenous contrast. COMPARISON:  MRI lumbar spine 11/13/2015. CT of the cervical spine June 04, 2022. FINDINGS: MRI HEAD FINDINGS Brain: No evidence of acute infarct, acute hemorrhage, mass lesion or midline shift. No hydrocephalus. Patchy T2/FLAIR hyperintensities within the white matter, compatible with chronic microvascular ischemic disease that is mild for age. Vascular: Major arterial flow voids are maintained at the skull base. Skull base and calvarium: Normal marrow signal. Sinuses/orbits: No acute findings. MRI CERVICAL SPINE FINDINGS Alignment: No substantial sagittal subluxation. Vertebrae: Vertebral body heights are maintained. C5-C7 ACDF. No focal marrow edema to suggest acute fracture or discitis/osteomyelitis. No suspicious lesions. Cord: Normal cord signal. Posterior Fossa, vertebral arteries, paraspinal tissues: Vertebral artery flow voids are  maintained. No acute findings. Disc levels: C2-C3: Left eccentric mild disc bulging/endplate spurring and facet/uncovertebral hypertrophy. No significant stenosis. C3-C4: Small posterior disc osteophyte complex with left greater than right facet and uncovertebral hypertrophy. Resulting moderate left foraminal stenosis. Patent canal. Borderline mild canal stenosis. C4-C5: Posterior disc osteophyte complex. Ligamentum flavum thickening. Bilateral facet uncovertebral hypertrophy. Mild bilateral foraminal stenosis. Mild to moderate canal stenosis. C5-C6: ACDF. Patent canal. Moderate to severe right foraminal stenosis due to facet uncovertebral hypertrophy. Patent left foramen. C6-C7: Posterior disc osteophyte complex. Patent canal and foramina. C7-T1: Facet arthropathy.  No significant stenosis. MRI LUMBAR SPINE FINDINGS Segmentation:  Standard segmentation is assumed. Alignment:  No substantial sagittal  subluxation.  L4-S1 PLIF. Vertebrae: In degenerative/discogenic endplate signal changes at L3-L4 with disc edema which likely relates to degenerative change. This is adjacent level to the PLIF. Otherwise, no focal marrow edema or suspicious bone lesion. Conus medullaris and cauda equina: Conus extends to the L1 level. Conus appears normal. Paraspinal and other soft tissues: Partially imaged left renal cyst. Disc levels: T12-L1: Disc bulging. Bilateral facet arthropathy. No significant stenosis. L1-L2: Bilateral facet arthropathy.  No significant stenosis. L2-L3: Disc bulging and bilateral facet arthropathy with ligamentum flavum thickening prominent dorsal epidural fat. Resulting moderate canal stenosis. Mild left foraminal stenosis. Findings are progressed. L3-L4: Posterior disc bulging and endplate spurring. Severe bilateral facet arthropathy. Resulting moderate left greater than right foraminal stenosis. The canal is patent status post decompression. L4-L5: PLIF.  Posterior decompression.  Patent canal and foramina.  L5-S1: PLIF and posterior decompression.  Patent canal and foramina. IMPRESSION: MRI head: 1. No acute abnormality. 2. 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 MRI cervical spine: 1. At C5-C6, moderate to severe right foraminal stenosis which is not substantially changed 2. At C3-C4, mild to moderate left foraminal stenosis which is progressed. 3. At C4-C5, mild to moderate canal stenosis with mild bilateral foraminal stenosis which is progressed. 4. C5-C7 ACDF. MRI lumbar spine: 1. At L2-L3, progressive moderate canal stenosis with left subarticular recess narrowing and mild left foraminal stenosis. 2. At L3-L4, mildly progressive moderate left greater than right foraminal stenosis. Subarticular recess narrowing at this level with patent canal status post decompression. 3. L4-S1 PLIF with patent canal and foramina at these levels. Electronically Signed   By: Margaretha Sheffield M.D.   On: 06/05/2022 13:29   EEG adult  Result Date: 06/04/2022 Lora Havens, MD     06/04/2022  4:04 PM Patient Name: KRISTO THORSEN MRN: [id number] Epilepsy Attending: Lora Havens Referring Physician/Provider: Lorenza Chick, MD Date: 06/04/2022 Duration: 21.10 mins Patient history: 84 to M presenting with acute right upper extremity weakness after recent fall with associated numbness. EEG to evaluate for seizure Level of alertness: Awake AEDs during EEG study: None Technical aspects: This EEG study was done with scalp electrodes positioned according to the 10-20 International system of electrode placement. Electrical activity was reviewed with band pass filter of 1-'70Hz'$ , sensitivity of 7 uV/mm, display speed of 9m/sec with a '60Hz'$  notched filter applied as appropriate. EEG data were recorded continuously and digitally stored.  Video monitoring was available and reviewed as appropriate. Description: No clear posterior dominant rhythm was seen. EEG showed continuous generalized 3 to 7 Hz theta-delta slowing.   Hyperventilation and photic stimulation were not performed.   Of note, EEG was technically difficult due to significant electrode artifact. ABNORMALITY - Continuous slow, generalized IMPRESSION: This technically difficult study is suggestive of moderate diffuse encephalopathy, nonspecific etiology.  No seizures or epileptiform discharges were seen throughout the recording. If suspicion for interictal activity remains a concern, a repeat study can be considered. [place]   Review of Systems  Constitutional: Negative.   HENT: Negative.    Eyes: Negative.   Respiratory: Negative.    Cardiovascular: Negative.   Gastrointestinal: Negative.   Endocrine: Negative.   Musculoskeletal:  Positive for back pain and myalgias.  Skin: Negative.   Neurological:  Positive for weakness.   Blood pressure (!) 141/63, pulse 64, temperature 98.1 F (36.7 C), temperature source Oral, resp. rate 18, height '5\' 9"'$  (1.753 m), weight 108.9 kg, SpO2 96 %. Physical Exam Constitutional:  Appearance: Normal appearance. He is obese.  HENT:     Head: Normocephalic.     Right Ear: External ear normal.     Left Ear: External ear normal.     Nose: Nose normal.     Mouth/Throat:     Mouth: Mucous membranes are moist.     Pharynx: Oropharynx is clear.  Eyes:     Extraocular Movements: Extraocular movements intact.     Pupils: Pupils are equal, round, and reactive to light.  Cardiovascular:     Rate and Rhythm: Normal rate and regular rhythm.  Pulmonary:     Effort: Pulmonary effort is normal.  Musculoskeletal:        General: Normal range of motion.  Skin:    General: Skin is warm and dry.  Neurological:     Mental Status: He is alert and oriented to person, place, and time. Mental status is at baseline.     Cranial Nerves: No cranial nerve deficit.     Sensory: No sensory deficit.     Motor: Motor function is intact. No weakness.     Deep Tendon Reflexes: Babinski sign absent on the right side.  Babinski sign absent on the left side.     Comments: Gait is poor Pain with passive abduction of the right shoulder.   Psychiatric:     Comments: anxious     Assessment/Plan: [name] is a 77 y.o. male With multiple complaints. MRi spine yesterday not impressive for acute changes. Nor would they account for the pain and weakness in the right shoulder. Strongly urge mri right shoulder. No operative indications on MRI or exam appreciated.   Ashok Pall 06/06/2022, 11:19 AM

## 2022-06-06 NOTE — Evaluation (Signed)
Occupational Therapy Evaluation Patient Details Name: [name] MRN: [id number] DOB: [birth year] Today's Date: 06/06/2022   History of Present Illness [name] is a 77 y/o male who presented on 06/04/22 after falling out of bed and having R UE weakness. Imaging neg for acute intracranial abnormality but did show cervical and lumbar spinal stenosis. PMH: chronic back pain with left lower extremity radicular pain, HTN, hypothyroidism, CAD s/p PCI 1999, T2DM, gout, GERD, hearing impairment and depression   Clinical Impression   Prior to this admission, patient living alone, no longer driving, and walking with a RW and using a scooter for longer distances. Patient is able to manage his medications and receives a ride in order to complete grocery shopping. Currently, patient presenting with inconsistent strength and ability in RUE and RLE, decreased cognition and awareness, poor activation noted in BLEs, however able to complete standing at min gaurd level once in standing completing a sit<>stand at max to total A of 2 level. Patient with minimal to no activation noted in BLEs when attempting sit<>stand with stedy, however is able to maintain balance in standing without external assist from PT/OT. Patient also noted to be self-limiting with minimal attempts to move RUE (though could grip and maintain balance when placed on stedy bars). OT recommending SNF placement due to current level of function. OT will continue to follow.      Recommendations for follow up therapy are one component of a multi-disciplinary discharge planning process, led by the attending physician.  Recommendations may be updated based on patient status, additional functional criteria and insurance authorization.   Follow Up Recommendations  Skilled nursing-short term rehab (<3 hours/day)     Assistance Recommended at Discharge Frequent or constant Supervision/Assistance  Patient can return home with the following Two people to  help with walking and/or transfers;A lot of help with bathing/dressing/bathroom;Assistance with cooking/housework;Direct supervision/assist for financial management;Direct supervision/assist for medications management;Assist for transportation;Help with stairs or ramp for entrance    Functional Status Assessment  Patient has had a recent decline in their functional status and demonstrates the ability to make significant improvements in function in a reasonable and predictable amount of time.  Equipment Recommendations  Other (comment) (Defer to next venue)    Recommendations for Other Services       Precautions / Restrictions Precautions Precautions: Fall Precaution Comments: back precautions for comfort only Restrictions Weight Bearing Restrictions: No      Mobility Bed Mobility Overal bed mobility: Needs Assistance Bed Mobility: Rolling, Sidelying to Sit Rolling: Max assist, +2 for physical assistance, +2 for safety/equipment Sidelying to sit: Total assist, +2 for physical assistance, +2 for safety/equipment, HOB elevated       General bed mobility comments: Pt assisted to bring R leg into flexion to place R foot on bed, poor initiation to reach R UE across body to therapist on his L to roll, maxAx2. TAx2 to bring legs off EOB and ascend trunk with poor initiation by pt.    Transfers Overall transfer level: Needs assistance Equipment used: 2 person hand held assist, Ambulation equipment used Transfers: Sit to/from Stand Sit to Stand: Total assist, +2 physical assistance, +2 safety/equipment, Min assist           General transfer comment: Attempted to stand from EOB with bil HHA the first rep, but pt minimally if at all initiating to push through legs, thus only cleared buttocks minimally and returned to sit. Changed approach to using stedy, again poor initiation by pt to  stand, relying on therapists to pull him up to at least ~1/2 stand before he initiates extension of legs  to stand, TA x2 with 1x from EOB and 1x from recliner to stedy. MinA to stand from stedy flaps 1x. Pt reporting he cannot use his R arm but able to grip the bar of the stedy when assisted in placing his R hand up on it.      Balance Overall balance assessment: Needs assistance Sitting-balance support: No upper extremity supported, Feet supported Sitting balance-Leahy Scale: Fair Sitting balance - Comments: Static sitting EOB with min guard   Standing balance support: Bilateral upper extremity supported, During functional activity, Reliant on assistive device for balance Standing balance-Leahy Scale: Poor Standing balance comment: Reliant on UE support, TAx2 to initiate coming up to stand from sit but then once pt would activate his legs to stand he could maintain standing in the stedy with min guard assist for >30 sec                           ADL either performed or assessed with clinical judgement   ADL Overall ADL's : Needs assistance/impaired Eating/Feeding: Set up;Sitting   Grooming: Set up;Sitting   Upper Body Bathing: Moderate assistance;Sitting   Lower Body Bathing: Total assistance;Sit to/from stand;Sitting/lateral leans   Upper Body Dressing : Moderate assistance;Sitting   Lower Body Dressing: Total assistance;Sit to/from stand;Sitting/lateral leans   Toilet Transfer: Maximal assistance;Total assistance;+2 for physical assistance;+2 for safety/equipment;BSC/3in1 Toilet Transfer Details (indicate cue type and reason): simulated with stedy transfers Toileting- Clothing Manipulation and Hygiene: Total assistance;Sitting/lateral lean;Sit to/from stand Toileting - Clothing Manipulation Details (indicate cue type and reason): unable to complete in standing     Functional mobility during ADLs: Maximal assistance;+2 for physical assistance;+2 for safety/equipment;Cueing for safety;Cueing for sequencing;Rolling walker (2 wheels) General ADL Comments: Patient presenting  with inconsistent strength and ability in RUE and RLE, decreased cognition and awareness, poor activation noted in BLEs, however able to complete standing at min gaurd level once in standing completing a sit<>stand at max to total A of 2 level.     Vision Baseline Vision/History: 1 Wears glasses (Readers) Ability to See in Adequate Light: 0 Adequate Patient Visual Report: No change from baseline Vision Assessment?: No apparent visual deficits     Perception     Praxis      Pertinent Vitals/Pain Pain Assessment Pain Assessment: Faces Faces Pain Scale: Hurts even more Pain Location: chronic back pain Pain Descriptors / Indicators: Grimacing, Discomfort, Guarding, Moaning Pain Intervention(s): Limited activity within patient's tolerance, Monitored during session, Repositioned     Hand Dominance Right   Extremity/Trunk Assessment Upper Extremity Assessment Upper Extremity Assessment: RUE deficits/detail;Generalized weakness RUE Deficits / Details: sensation WNL, patient stating he cant move his arm at all but using it to fully brace himself sitting EOB with activation noted throughout. Able to shrug shoulders and hold against gravity, able to grip onto stedy when R hand placed on stedy RUE: Unable to fully assess due to pain;Subluxation noted (minimal subluxation noted) RUE Sensation: WNL RUE Coordination: decreased fine motor;decreased gross motor   Lower Extremity Assessment Lower Extremity Assessment: Defer to PT evaluation RLE Deficits / Details: denies numbness/tinging bil; grossly 4- to 4 with MMT RLE Sensation: WNL RLE Coordination: decreased gross motor LLE Deficits / Details: denies numbness/tinging bil; grossly 4- to 4 with MMT LLE Sensation: WNL LLE Coordination: decreased gross motor   Cervical / Trunk Assessment Cervical /  Trunk Assessment: Other exceptions Cervical / Trunk Exceptions: chronic low back pain   Communication Communication Communication: HOH    Cognition Arousal/Alertness: Awake/alert Behavior During Therapy: WFL for tasks assessed/performed Overall Cognitive Status: No family/caregiver present to determine baseline cognitive functioning                                 General Comments: Oriented, but poor attempts to initiate mobility when cued, often stating "I can't" but then stating "all I can do is try", but reliant on therapists to initiate before pt engages his muscles. Poor understanding that he needs to activate his legs to stand with pt stating he needs his arms to do the work but they cannot due to his R arm weakness.     General Comments       Exercises     Shoulder Instructions      Home Living Family/patient expects to be discharged to:: Private residence Living Arrangements: Alone Available Help at Discharge: Family;Friend(s);Available PRN/intermittently Type of Home: Apartment Home Access: Level entry     Home Layout: One level     Bathroom Shower/Tub: Teacher, early years/pre: Standard     Home Equipment: Rollator (4 wheels);Grab bars - tub/shower;Electric scooter;Grab bars - toilet;Other (comment) (bedrail; lift chair)          Prior Functioning/Environment Prior Level of Function : Independent/Modified Independent             Mobility Comments: Uses rollator, walking up to ~40 min at a time per pt. Has electric scooter for longer distances in community ADLs Comments: Not driving. Pt managing his own meds with pill box. Friend assists with transportation.        OT Problem List: Decreased strength;Decreased range of motion;Decreased activity tolerance;Impaired balance (sitting and/or standing);Decreased coordination;Decreased cognition;Decreased safety awareness;Decreased knowledge of use of DME or AE;Decreased knowledge of precautions;Impaired UE functional use;Pain      OT Treatment/Interventions: Self-care/ADL training;Therapeutic exercise;Energy  conservation;Neuromuscular education;DME and/or AE instruction;Manual therapy;Therapeutic activities;Cognitive remediation/compensation;Patient/family education;Balance training    OT Goals(Current goals can be found in the care plan section) Acute Rehab OT Goals Patient Stated Goal: to walk again OT Goal Formulation: With patient Time For Goal Achievement: 06/20/22 Potential to Achieve Goals: Fair ADL Goals Pt Will Perform Lower Body Bathing: with mod assist;sit to/from stand;sitting/lateral leans;with adaptive equipment Pt Will Perform Lower Body Dressing: with mod assist;sit to/from stand;sitting/lateral leans;with adaptive equipment Pt Will Transfer to Toilet: with mod assist;stand pivot transfer;bedside commode Pt Will Perform Toileting - Clothing Manipulation and hygiene: with mod assist;sitting/lateral leans;sit to/from stand Additional ADL Goal #1: Patient will be able to demonstrate increased ability to complete bed mobility at mod A level as a precursor to ADL activities.  OT Frequency: Min 2X/week    Co-evaluation   Reason for Co-Treatment: For patient/therapist safety;To address functional/ADL transfers PT goals addressed during session: Mobility/safety with mobility;Balance        AM-PAC OT "6 Clicks" Daily Activity     Outcome Measure Help from another person eating meals?: A Little Help from another person taking care of personal grooming?: A Little Help from another person toileting, which includes using toliet, bedpan, or urinal?: Total Help from another person bathing (including washing, rinsing, drying)?: A Lot Help from another person to put on and taking off regular upper body clothing?: A Lot Help from another person to put on and taking off regular lower body clothing?:  Total 6 Click Score: 12   End of Session Equipment Utilized During Treatment: Gait belt;Rolling walker (2 wheels);Other (comment) Charlaine Dalton) Nurse Communication: Mobility status  Activity  Tolerance: Patient limited by fatigue;Patient limited by pain;Patient limited by lethargy Patient left: in chair;with call bell/phone within reach;with chair alarm set  OT Visit Diagnosis: Unsteadiness on feet (R26.81);Other abnormalities of gait and mobility (R26.89);Repeated falls (R29.6);Muscle weakness (generalized) (M62.81);History of falling (Z91.81);Other symptoms and signs involving cognitive function;Pain Pain - Right/Left: Right (Back) Pain - part of body: Shoulder                Time: [id number] OT Time Calculation (min): 32 min Charges:  OT General Charges $OT Visit: 1 Visit OT Evaluation $OT Eval Moderate Complexity: 1 Mod  Corinne Ports E. Lashawnna Lambrecht, OTR/L Acute Rehabilitation Services [phone]   Ascencion Dike 06/06/2022, 12:50 PM

## 2022-06-07 LAB — GLUCOSE, RANDOM: Glucose, Bld: 409 mg/dL — ABNORMAL HIGH (ref 70–99)

## 2022-06-07 LAB — COMPREHENSIVE METABOLIC PANEL
ALT: 29 U/L (ref 0–44)
AST: 30 U/L (ref 15–41)
Albumin: 2.5 g/dL — ABNORMAL LOW (ref 3.5–5.0)
Alkaline Phosphatase: 48 U/L (ref 38–126)
Anion gap: 12 (ref 5–15)
BUN: 26 mg/dL — ABNORMAL HIGH (ref 8–23)
CO2: 21 mmol/L — ABNORMAL LOW (ref 22–32)
Calcium: 8.3 mg/dL — ABNORMAL LOW (ref 8.9–10.3)
Chloride: 100 mmol/L (ref 98–111)
Creatinine, Ser: 0.78 mg/dL (ref 0.61–1.24)
GFR, Estimated: >60 mL/min (ref >60)
Glucose, Bld: 343 mg/dL — ABNORMAL HIGH (ref 70–99)
Potassium: 4.5 mmol/L (ref 3.5–5.1)
Sodium: 133 mmol/L — ABNORMAL LOW (ref 135–145)
Total Bilirubin: 0.8 mg/dL (ref 0.3–1.2)
Total Protein: 5.9 g/dL — ABNORMAL LOW (ref 6.5–8.1)

## 2022-06-07 LAB — GLUCOSE, CAPILLARY
Glucose-Capillary: 419 mg/dL — ABNORMAL HIGH (ref 70–99)
Glucose-Capillary: 367 mg/dL — ABNORMAL HIGH (ref 70–99)

## 2022-06-07 LAB — CK: Total CK: 285 U/L (ref 49–397)

## 2022-06-07 MED ORDER — INSULIN ASPART 100 UNIT/ML IJ SOLN
0.0000 [IU] | Freq: Three times a day (TID) | INTRAMUSCULAR | Status: DC
  Administered 2022-06-08: 8 [IU] via SUBCUTANEOUS
  Administered 2022-06-08: 11 [IU] via SUBCUTANEOUS

## 2022-06-07 MED ORDER — INSULIN ASPART 100 UNIT/ML IJ SOLN
0.0000 [IU] | Freq: Every day | INTRAMUSCULAR | Status: DC
  Administered 2022-06-07: 5 [IU] via SUBCUTANEOUS
  Administered 2022-06-08: 4 [IU] via SUBCUTANEOUS

## 2022-06-07 MED ORDER — SENNOSIDES-DOCUSATE SODIUM 8.6-50 MG PO TABS: 1.0000 | ORAL_TABLET | Freq: Every day | ORAL | Status: DC

## 2022-06-07 MED ORDER — INSULIN ASPART 100 UNIT/ML IJ SOLN
10.0000 [IU] | Freq: Once | INTRAMUSCULAR | Status: AC
  Administered 2022-06-07: 10 [IU] via SUBCUTANEOUS

## 2022-06-07 MED ORDER — SENNOSIDES-DOCUSATE SODIUM 8.6-50 MG PO TABS
1.0000 | ORAL_TABLET | Freq: Every day | ORAL | Status: DC
  Administered 2022-06-07 – 2022-06-10 (×4): 1 via ORAL
  Filled 2022-06-07 (×4): qty 1

## 2022-06-07 MED ORDER — ORAL CARE MOUTH RINSE: 15.0000 mL | OROMUCOSAL | Status: DC | PRN

## 2022-06-07 MED ORDER — BISACODYL 10 MG RE SUPP
10.0000 mg | Freq: Once | RECTAL | Status: AC
  Administered 2022-06-07: 10 mg via RECTAL
  Filled 2022-06-07: qty 1

## 2022-06-07 NOTE — Plan of Care (Signed)

## 2022-06-07 NOTE — TOC Initial Note (Signed)
Transition of Care [facility]) - Initial/Assessment Note    Patient Details  Name: [name] MRN: [id number] Date of Birth: [birth year]  Transition of Care [facility]) CM/SW Contact:    Milinda Antis, [place] Phone Number: 06/07/2022, 3:26 PM  Clinical Narrative:                 CSW received consult for possible SNF placement at time of discharge. CSW spoke with patient at bedside.  Patient expressed understanding of PT recommendation and is agreeable to SNF placement at time of discharge. Patient reports preference for [facility]. CSW discussed insurance authorization process and provided Medicare SNF ratings list. CSW will send out referrals for review and provide bed offers as available.     Expected Discharge Plan: Skilled Nursing Facility Barriers to Discharge: Ship broker, Continued Medical Work up, SNF Pending bed offer   Patient Goals and CMS Choice   CMS Medicare.gov Compare Post Acute Care list provided to:: Patient Choice offered to / list presented to : Patient      Expected Discharge Plan and Services In-house Referral: Clinical Social Work     Living arrangements for the past 2 months: Apartment                                      Prior Living Arrangements/Services Living arrangements for the past 2 months: Apartment Lives with:: Self Patient language and need for interpreter reviewed:: Yes Do you feel safe going back to the place where you live?: Yes      Need for Family Participation in Patient Care: Yes (Comment) Care giver support system in place?: Yes (comment)   Criminal Activity/Legal Involvement Pertinent to Current Situation/Hospitalization: No - Comment as needed  Activities of Daily Living      Permission Sought/Granted   Permission granted to share information with : Yes, Verbal Permission Granted     Permission granted to share info w AGENCY: SNFs        Emotional Assessment Appearance:: Appears stated  age Attitude/Demeanor/Rapport: Engaged Affect (typically observed): Adaptable Orientation: : Oriented to Situation, Oriented to  Time, Oriented to Place, Oriented to Self Alcohol / Substance Use: Not Applicable Psych Involvement: No (comment)  Admission diagnosis:  Weakness of right upper extremity [R29.898] Acute cystitis without hematuria [N30.00] Right sided weakness [R53.1] Fall, initial encounter [W19.XXXA] Patient Active Problem List   Diagnosis Date Noted   Right sided weakness 06/04/2022   Weakness of right upper extremity 06/04/2022   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   Chronic, continuous use of opioids 02/12/2021   Other spondylosis, lumbar region 02/12/2021   NSVT (nonsustained ventricular tachycardia) (HCC) 10/12/2020   PAT (paroxysmal atrial tachycardia) 10/12/2020   History of fusion of cervical spine 09/04/2020   Lumbar post-laminectomy syndrome 09/04/2020   Pain medication agreement 09/04/2020   Anxiety    Arthritis    Chronic back pain    Coronary artery disease    Depression    Diverticulitis    Dizziness    DJD (degenerative joint disease)    GERD (gastroesophageal reflux disease)    History of colon polyps    History of kidney stones    Hypertension    Hypothyroidism    IBS (irritable bowel syndrome)    Tuberculosis    Type II diabetes mellitus (HCC)    Weakness    Abnormal stress test    Fatigue 09/06/2019  Preoperative cardiovascular examination 09/06/2019   HNP (herniated nucleus pulposus), lumbar 06/14/2014   Atypical chest pain 12/28/2013   Unstable angina ([place]) 12/27/2013   CAD in native artery 03/14/2013    Class: Chronic   Hyperlipidemia 03/14/2013   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[place]) 03/14/2013   Chest pain 07/30/2011   Obesity, unspecified 07/30/2011   Essential hypertension, benign 07/30/2011   Diabetes mellitus type 2, controlled, with complications ([place])  [id number]   PCP:  Cyndi Bender, PA-C Pharmacy:   [facility] [address], [place] [address] Phone: [phone] Fax: [phone]  [id number] [id number]: [phone]: [phone]    Social Determinants of Health (SDOH) Social History: SDOH Screenings   Tobacco Use: High Risk (06/06/2022)   SDOH Interventions:     Readmission Risk Interventions     No data to display

## 2022-06-07 NOTE — Progress Notes (Signed)
Providing Compassionate, Quality Care - Together   Subjective: Patient reports right shoulder pain and low back pain. Denies numbness or tingling of his extremities. Feels his right shoulder is weak. He states that he is hungry and would like to know when his shoulder MRI is scheduled.  Objective: Vital signs in last 24 hours: Temp:  [97.7 F (36.5 C)-98.6 F (37 C)] 98 F (36.7 C) (02/24 0831) Pulse Rate:  [58-67] 67 (02/24 0831) Resp:  [18] 18 (02/24 0831) BP: (120-160)/(56-79) 154/79 (02/24 0831) SpO2:  [95 %-99 %] 95 % (02/24 0831)  Intake/Output from previous day: 02/23 0701 - 02/24 0700 In: -  Out: 2650 [Urine:2650] Intake/Output this shift: No intake/output data recorded.  Alert and oriented x 4 PERRLA CN II-XII grossly intact MAE, Pain with passive abduction of right shoulder Incision is covered with Honeycomb dressing and Steri Strips; Dressing is clean, dry, and intact   Lab Results: Recent Labs    06/05/22 0510 06/06/22 0752  WBC 12.0* 11.4*  HGB 11.8* 11.9*  HCT 35.0* 36.4*  PLT 126* 144*   BMET Recent Labs    06/06/22 0752 06/07/22 0429  NA 133* 133*  K 3.8 4.5  CL 100 100  CO2 24 21*  GLUCOSE 201* 343*  BUN 27* 26*  CREATININE 0.78 0.78  CALCIUM 8.4* 8.3*    Studies/Results: MR Cervical Spine Wo Contrast  Result Date: 06/05/2022 CLINICAL DATA:  RUE weakness, neck pain; RUE weakness; back pain sp fall, weakness EXAM: MRI HEAD WITHOUT CONTRAST MRI CERVICAL AND LUMBAR SPINE WITHOUT CONTRAST TECHNIQUE: Multiplanar, multiecho pulse sequences of the brain and surrounding structures were obtained without contrast. Multiplanar and multiecho pulse sequences of the cervical spine, to include the craniocervical junction and cervicothoracic junction, and thoracic and lumbar spine, were obtained without intravenous contrast. COMPARISON:  MRI lumbar spine 11/13/2015. CT of the cervical spine June 04, 2022. FINDINGS: MRI HEAD FINDINGS Brain: No  evidence of acute infarct, acute hemorrhage, mass lesion or midline shift. No hydrocephalus. Patchy T2/FLAIR hyperintensities within the white matter, compatible with chronic microvascular ischemic disease that is mild for age. Vascular: Major arterial flow voids are maintained at the skull base. Skull base and calvarium: Normal marrow signal. Sinuses/orbits: No acute findings. MRI CERVICAL SPINE FINDINGS Alignment: No substantial sagittal subluxation. Vertebrae: Vertebral body heights are maintained. C5-C7 ACDF. No focal marrow edema to suggest acute fracture or discitis/osteomyelitis. No suspicious lesions. Cord: Normal cord signal. Posterior Fossa, vertebral arteries, paraspinal tissues: Vertebral artery flow voids are maintained. No acute findings. Disc levels: C2-C3: Left eccentric mild disc bulging/endplate spurring and facet/uncovertebral hypertrophy. No significant stenosis. C3-C4: Small posterior disc osteophyte complex with left greater than right facet and uncovertebral hypertrophy. Resulting moderate left foraminal stenosis. Patent canal. Borderline mild canal stenosis. C4-C5: Posterior disc osteophyte complex. Ligamentum flavum thickening. Bilateral facet uncovertebral hypertrophy. Mild bilateral foraminal stenosis. Mild to moderate canal stenosis. C5-C6: ACDF. Patent canal. Moderate to severe right foraminal stenosis due to facet uncovertebral hypertrophy. Patent left foramen. C6-C7: Posterior disc osteophyte complex. Patent canal and foramina. C7-T1: Facet arthropathy.  No significant stenosis. MRI LUMBAR SPINE FINDINGS Segmentation:  Standard segmentation is assumed. Alignment:  No substantial sagittal subluxation.  L4-S1 PLIF. Vertebrae: In degenerative/discogenic endplate signal changes at L3-L4 with disc edema which likely relates to degenerative change. This is adjacent level to the PLIF. Otherwise, no focal marrow edema or suspicious bone lesion. Conus medullaris and cauda equina: Conus extends  to the L1 level. Conus appears normal. Paraspinal and other soft  tissues: Partially imaged left renal cyst. Disc levels: T12-L1: Disc bulging. Bilateral facet arthropathy. No significant stenosis. L1-L2: Bilateral facet arthropathy.  No significant stenosis. L2-L3: Disc bulging and bilateral facet arthropathy with ligamentum flavum thickening prominent dorsal epidural fat. Resulting moderate canal stenosis. Mild left foraminal stenosis. Findings are progressed. L3-L4: Posterior disc bulging and endplate spurring. Severe bilateral facet arthropathy. Resulting moderate left greater than right foraminal stenosis. The canal is patent status post decompression. L4-L5: PLIF.  Posterior decompression.  Patent canal and foramina. L5-S1: PLIF and posterior decompression.  Patent canal and foramina. IMPRESSION: MRI head: 1. No acute abnormality. 2. 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 MRI cervical spine: 1. At C5-C6, moderate to severe right foraminal stenosis which is not substantially changed 2. At C3-C4, mild to moderate left foraminal stenosis which is progressed. 3. At C4-C5, mild to moderate canal stenosis with mild bilateral foraminal stenosis which is progressed. 4. C5-C7 ACDF. MRI lumbar spine: 1. At L2-L3, progressive moderate canal stenosis with left subarticular recess narrowing and mild left foraminal stenosis. 2. At L3-L4, mildly progressive moderate left greater than right foraminal stenosis. Subarticular recess narrowing at this level with patent canal status post decompression. 3. L4-S1 PLIF with patent canal and foramina at these levels. Electronically Signed   By: Margaretha Sheffield M.D.   On: 06/05/2022 13:29   MR LUMBAR SPINE WO CONTRAST  Result Date: 06/05/2022 CLINICAL DATA:  RUE weakness, neck pain; RUE weakness; back pain sp fall, weakness EXAM: MRI HEAD WITHOUT CONTRAST MRI CERVICAL AND LUMBAR SPINE WITHOUT CONTRAST TECHNIQUE: Multiplanar, multiecho pulse sequences of the brain and  surrounding structures were obtained without contrast. Multiplanar and multiecho pulse sequences of the cervical spine, to include the craniocervical junction and cervicothoracic junction, and thoracic and lumbar spine, were obtained without intravenous contrast. COMPARISON:  MRI lumbar spine 11/13/2015. CT of the cervical spine June 04, 2022. FINDINGS: MRI HEAD FINDINGS Brain: No evidence of acute infarct, acute hemorrhage, mass lesion or midline shift. No hydrocephalus. Patchy T2/FLAIR hyperintensities within the white matter, compatible with chronic microvascular ischemic disease that is mild for age. Vascular: Major arterial flow voids are maintained at the skull base. Skull base and calvarium: Normal marrow signal. Sinuses/orbits: No acute findings. MRI CERVICAL SPINE FINDINGS Alignment: No substantial sagittal subluxation. Vertebrae: Vertebral body heights are maintained. C5-C7 ACDF. No focal marrow edema to suggest acute fracture or discitis/osteomyelitis. No suspicious lesions. Cord: Normal cord signal. Posterior Fossa, vertebral arteries, paraspinal tissues: Vertebral artery flow voids are maintained. No acute findings. Disc levels: C2-C3: Left eccentric mild disc bulging/endplate spurring and facet/uncovertebral hypertrophy. No significant stenosis. C3-C4: Small posterior disc osteophyte complex with left greater than right facet and uncovertebral hypertrophy. Resulting moderate left foraminal stenosis. Patent canal. Borderline mild canal stenosis. C4-C5: Posterior disc osteophyte complex. Ligamentum flavum thickening. Bilateral facet uncovertebral hypertrophy. Mild bilateral foraminal stenosis. Mild to moderate canal stenosis. C5-C6: ACDF. Patent canal. Moderate to severe right foraminal stenosis due to facet uncovertebral hypertrophy. Patent left foramen. C6-C7: Posterior disc osteophyte complex. Patent canal and foramina. C7-T1: Facet arthropathy.  No significant stenosis. MRI LUMBAR SPINE FINDINGS  Segmentation:  Standard segmentation is assumed. Alignment:  No substantial sagittal subluxation.  L4-S1 PLIF. Vertebrae: In degenerative/discogenic endplate signal changes at L3-L4 with disc edema which likely relates to degenerative change. This is adjacent level to the PLIF. Otherwise, no focal marrow edema or suspicious bone lesion. Conus medullaris and cauda equina: Conus extends to the L1 level. Conus appears normal. Paraspinal and other soft tissues: Partially  imaged left renal cyst. Disc levels: T12-L1: Disc bulging. Bilateral facet arthropathy. No significant stenosis. L1-L2: Bilateral facet arthropathy.  No significant stenosis. L2-L3: Disc bulging and bilateral facet arthropathy with ligamentum flavum thickening prominent dorsal epidural fat. Resulting moderate canal stenosis. Mild left foraminal stenosis. Findings are progressed. L3-L4: Posterior disc bulging and endplate spurring. Severe bilateral facet arthropathy. Resulting moderate left greater than right foraminal stenosis. The canal is patent status post decompression. L4-L5: PLIF.  Posterior decompression.  Patent canal and foramina. L5-S1: PLIF and posterior decompression.  Patent canal and foramina. IMPRESSION: MRI head: 1. No acute abnormality. 2. 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 MRI cervical spine: 1. At C5-C6, moderate to severe right foraminal stenosis which is not substantially changed 2. At C3-C4, mild to moderate left foraminal stenosis which is progressed. 3. At C4-C5, mild to moderate canal stenosis with mild bilateral foraminal stenosis which is progressed. 4. C5-C7 ACDF. MRI lumbar spine: 1. At L2-L3, progressive moderate canal stenosis with left subarticular recess narrowing and mild left foraminal stenosis. 2. At L3-L4, mildly progressive moderate left greater than right foraminal stenosis. Subarticular recess narrowing at this level with patent canal status post decompression. 3. L4-S1 PLIF with patent canal and  foramina at these levels. Electronically Signed   By: Margaretha Sheffield M.D.   On: 06/05/2022 13:29   MR BRAIN WO CONTRAST  Result Date: 06/05/2022 CLINICAL DATA:  RUE weakness, neck pain; RUE weakness; back pain sp fall, weakness EXAM: MRI HEAD WITHOUT CONTRAST MRI CERVICAL AND LUMBAR SPINE WITHOUT CONTRAST TECHNIQUE: Multiplanar, multiecho pulse sequences of the brain and surrounding structures were obtained without contrast. Multiplanar and multiecho pulse sequences of the cervical spine, to include the craniocervical junction and cervicothoracic junction, and thoracic and lumbar spine, were obtained without intravenous contrast. COMPARISON:  MRI lumbar spine 11/13/2015. CT of the cervical spine June 04, 2022. FINDINGS: MRI HEAD FINDINGS Brain: No evidence of acute infarct, acute hemorrhage, mass lesion or midline shift. No hydrocephalus. Patchy T2/FLAIR hyperintensities within the white matter, compatible with chronic microvascular ischemic disease that is mild for age. Vascular: Major arterial flow voids are maintained at the skull base. Skull base and calvarium: Normal marrow signal. Sinuses/orbits: No acute findings. MRI CERVICAL SPINE FINDINGS Alignment: No substantial sagittal subluxation. Vertebrae: Vertebral body heights are maintained. C5-C7 ACDF. No focal marrow edema to suggest acute fracture or discitis/osteomyelitis. No suspicious lesions. Cord: Normal cord signal. Posterior Fossa, vertebral arteries, paraspinal tissues: Vertebral artery flow voids are maintained. No acute findings. Disc levels: C2-C3: Left eccentric mild disc bulging/endplate spurring and facet/uncovertebral hypertrophy. No significant stenosis. C3-C4: Small posterior disc osteophyte complex with left greater than right facet and uncovertebral hypertrophy. Resulting moderate left foraminal stenosis. Patent canal. Borderline mild canal stenosis. C4-C5: Posterior disc osteophyte complex. Ligamentum flavum thickening. Bilateral  facet uncovertebral hypertrophy. Mild bilateral foraminal stenosis. Mild to moderate canal stenosis. C5-C6: ACDF. Patent canal. Moderate to severe right foraminal stenosis due to facet uncovertebral hypertrophy. Patent left foramen. C6-C7: Posterior disc osteophyte complex. Patent canal and foramina. C7-T1: Facet arthropathy.  No significant stenosis. MRI LUMBAR SPINE FINDINGS Segmentation:  Standard segmentation is assumed. Alignment:  No substantial sagittal subluxation.  L4-S1 PLIF. Vertebrae: In degenerative/discogenic endplate signal changes at L3-L4 with disc edema which likely relates to degenerative change. This is adjacent level to the PLIF. Otherwise, no focal marrow edema or suspicious bone lesion. Conus medullaris and cauda equina: Conus extends to the L1 level. Conus appears normal. Paraspinal and other soft tissues: Partially imaged left renal  cyst. Disc levels: T12-L1: Disc bulging. Bilateral facet arthropathy. No significant stenosis. L1-L2: Bilateral facet arthropathy.  No significant stenosis. L2-L3: Disc bulging and bilateral facet arthropathy with ligamentum flavum thickening prominent dorsal epidural fat. Resulting moderate canal stenosis. Mild left foraminal stenosis. Findings are progressed. L3-L4: Posterior disc bulging and endplate spurring. Severe bilateral facet arthropathy. Resulting moderate left greater than right foraminal stenosis. The canal is patent status post decompression. L4-L5: PLIF.  Posterior decompression.  Patent canal and foramina. L5-S1: PLIF and posterior decompression.  Patent canal and foramina. IMPRESSION: MRI head: 1. No acute abnormality. 2. 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 MRI cervical spine: 1. At C5-C6, moderate to severe right foraminal stenosis which is not substantially changed 2. At C3-C4, mild to moderate left foraminal stenosis which is progressed. 3. At C4-C5, mild to moderate canal stenosis with mild bilateral foraminal stenosis which  is progressed. 4. C5-C7 ACDF. MRI lumbar spine: 1. At L2-L3, progressive moderate canal stenosis with left subarticular recess narrowing and mild left foraminal stenosis. 2. At L3-L4, mildly progressive moderate left greater than right foraminal stenosis. Subarticular recess narrowing at this level with patent canal status post decompression. 3. L4-S1 PLIF with patent canal and foramina at these levels. Electronically Signed   By: Margaretha Sheffield M.D.   On: 06/05/2022 13:29    Assessment/Plan: Patient suffered a fall at home on 06/04/2022. He had right shoulder weakness following his fall. MRI of his head and neck did not have any findings to explain the patient's right shoulder weakness. MRI of the patient's right shoulder has been ordered. Due to the patient's severe phobia of MRI, he requires general anesthesia.   LOS: 3 days   -Discussed with MRI. They have coordinated with anesthesia for 3 pm today. -No Neurosurgical intervention recommended at present. -Mobilize patient.   Viona Gilmore, DNP, AGNP-C Nurse Practitioner  [facility] [place] [address], [address], [place], [place] [address] P: [phone]    F: [phone]  06/07/2022, 10:09 AM

## 2022-06-07 NOTE — Progress Notes (Signed)
Patient complained of lower back pain and was anxious. So, asked the dr. at bedside if his scheduled pain meds can be given and gave his pain med with a sip of water as per the doctor's order.

## 2022-06-07 NOTE — NC FL2 (Signed)
[place] LEVEL OF CARE FORM     IDENTIFICATION  Patient Name: [name] Birthdate: 22-Nov-1945 Sex: male Admission Date (Current Location): 06/04/2022  [facility] and [place] Number:  Herbalist and Address:  [facility] [place] [address] [place], [place] [address]      Provider Number: [id number]  Attending Physician Name and Address:  Aldine Contes, MD  Relative Name and Phone Number:  Annice Needy (Sister) [phone]    Current Level of Care: Hospital Recommended Level of Care: [place] Prior Approval Number:    Date Approved/Denied:   PASRR Number: [id number]  Discharge Plan: SNF    Current Diagnoses: Patient Active Problem List   Diagnosis Date Noted   Right sided weakness 06/04/2022   Weakness of right upper extremity 06/04/2022   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   Chronic, continuous use of opioids 02/12/2021   Other spondylosis, lumbar region 02/12/2021   NSVT (nonsustained ventricular tachycardia) (HCC) 10/12/2020   PAT (paroxysmal atrial tachycardia) 10/12/2020   History of fusion of cervical spine 09/04/2020   Lumbar post-laminectomy syndrome 09/04/2020   Pain medication agreement 09/04/2020   Anxiety    Arthritis    Chronic back pain    Coronary artery disease    Depression    Diverticulitis    Dizziness    DJD (degenerative joint disease)    GERD (gastroesophageal reflux disease)    History of colon polyps    History of kidney stones    Hypertension    Hypothyroidism    IBS (irritable bowel syndrome)    Tuberculosis    Type II diabetes mellitus ([place])    Weakness    Abnormal stress test    Fatigue 09/06/2019   Preoperative cardiovascular examination 09/06/2019   HNP (herniated nucleus pulposus), lumbar 06/14/2014   Atypical chest pain 12/28/2013   Unstable angina ([place]) 12/27/2013   CAD in native artery 03/14/2013   Hyperlipidemia 03/14/2013    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[place]) 03/14/2013   Chest pain 07/30/2011   Obesity, unspecified 07/30/2011   Essential hypertension, benign 07/30/2011   Diabetes mellitus type 2, controlled, with complications ([place]) [id number]    Orientation RESPIRATION BLADDER Height & Weight     Self, Time, Situation, Place  Normal Incontinent Weight: 240 lb (108.9 kg) Height:  '5\' 9"'$  (175.3 cm)  BEHAVIORAL SYMPTOMS/MOOD NEUROLOGICAL BOWEL NUTRITION STATUS      Continent Diet (see d/c summary)  AMBULATORY STATUS COMMUNICATION OF NEEDS Skin   Total Care Verbally Normal                       Personal Care Assistance Level of Assistance  Bathing, Feeding, Dressing Bathing Assistance: Maximum assistance Feeding assistance: Independent Dressing Assistance: Maximum assistance     Functional Limitations Info  Sight, Hearing, Speech Sight Info: Impaired Hearing Info: Adequate Speech Info: Adequate    SPECIAL CARE FACTORS FREQUENCY  OT (By licensed OT), PT (By licensed PT)     PT Frequency: 5x/ week OT Frequency: 5x/ week            Contractures Contractures Info: Not present    Additional Factors Info  Insulin Sliding Scale, Allergies, Code Status, Psychotropic Code Status Info: Full Allergies Info: Sulfa Antibiotics Psychotropic Info: Sertraline '100mg'$  Insulin Sliding Scale Info: see d/c med list       Current Medications (06/07/2022):  This is the current hospital active medication  list Current Facility-Administered Medications  Medication Dose Route Frequency Provider Last Rate Last Admin   acetaminophen (TYLENOL) tablet 1,000 mg  1,000 mg Oral TID Virl Axe, MD   1,000 mg at 06/07/22 1143   aspirin EC tablet 81 mg  81 mg Oral Daily Katsadouros, Vasilios, MD   81 mg at 06/07/22 1400   atorvastatin (LIPITOR) tablet 80 mg  80 mg Oral Daily Katsadouros, Vasilios, MD   80 mg at 06/07/22 1359   carvedilol (COREG) tablet 12.5 mg   12.5 mg Oral BID WC Katsadouros, Vasilios, MD   12.5 mg at 06/06/22 [id number]   clopidogrel (PLAVIX) tablet 75 mg  75 mg Oral Q breakfast Katsadouros, Vasilios, MD   75 mg at 06/07/22 1358   dexamethasone (DECADRON) tablet 4 mg  4 mg Oral Daily Katsadouros, Vasilios, MD   4 mg at 06/07/22 1400   diclofenac Sodium (VOLTAREN) 1 % topical gel 4 g  4 g Topical QID Katsadouros, Vasilios, MD   4 g at 06/07/22 1412   enoxaparin (LOVENOX) injection 60 mg  60 mg Subcutaneous Q24H Jorene Guest B, RPH-CPP   60 mg at 06/07/22 1156   insulin aspart (novoLOG) injection 0-15 Units  0-15 Units Subcutaneous TID WC Virl Axe, MD       insulin aspart (novoLOG) injection 0-5 Units  0-5 Units Subcutaneous QHS Virl Axe, MD       levothyroxine (SYNTHROID) tablet 50 mcg  50 mcg Oral Q0600 Katsadouros, Vasilios, MD   50 mcg at 06/07/22 0530   lidocaine (LIDODERM) 5 % 1 patch  1 patch Transdermal Q24H Katsadouros, Vasilios, MD   1 patch at 06/06/22 1615   melatonin tablet 3 mg  3 mg Oral QHS Angelique Blonder, DO   3 mg at 06/06/22 2146   methocarbamol (ROBAXIN) tablet 750 mg  750 mg Oral TID Virl Axe, MD   750 mg at 06/07/22 1143   Oral care mouth rinse  15 mL Mouth Rinse PRN Aldine Contes, MD       polyethylene glycol (MIRALAX / GLYCOLAX) packet 17 g  17 g Oral q AM Katsadouros, Vasilios, MD   17 g at 06/07/22 1411   senna-docusate (Senokot-S) tablet 1 tablet  1 tablet Oral Daily Angelique Blonder, DO   1 tablet at 06/07/22 [id number]   sertraline (ZOLOFT) tablet 100 mg  100 mg Oral QHS Katsadouros, Vasilios, MD   100 mg at 06/06/22 2146   traMADol (ULTRAM) tablet 50 mg  50 mg Oral Q8H PRN Linus Galas, MD   50 mg at 06/07/22 [id number]     Discharge Medications: Please see discharge summary for a list of discharge medications.  Relevant Imaging Results:  Relevant Lab Results:   Additional Information SSN: [place] [address] F Deirdre Gryder, LCSWA

## 2022-06-07 NOTE — Progress Notes (Addendum)
Subjective:   Summary: [name] is a 77 y.o. year old male currently admitted on the IMTS HD#3 for right arm weakness.  Overnight Events:   No acute overnight events.  His exam remains essentially unchanged from yesterday. We discussed plan to obtain MRI of his shoulder and brachial plexus for further evaluation and to decide next steps. He is worried about his mobility moving forward, especially in the context of his back pain limiting mobility. He wants to see what this MRI shows because he knows he will need his arm to walk with a walker and for general balance. He is having some pain right now so will give a one-time pain dose with water sip.    ADDENDUM: Per RN, radiology unable to take patient for MRI today, have tentatively rescheduled him for tomorrow AM at 0800. Will restart diet and make him NPO at MN.  Objective:  Vital signs in last 24 hours: Vitals:   06/06/22 2343 06/07/22 0358 06/07/22 0831 06/07/22 1149  BP: (!) 145/72 139/74 (!) 154/79 (!) 146/60  Pulse: (!) 58 65 67 (!) 58  Resp: 18 18 18 18  $ Temp: 98.2 F (36.8 C) 97.7 F (36.5 C) 98 F (36.7 C) 98.4 F (36.9 C)  TempSrc: Oral Oral Oral Oral  SpO2: 97% 95% 95% 99%  Weight:      Height:       Supplemental O2: Room Air    Physical Exam:  General: elderly male, laying in bed, NAD. CV: normal rate and regular rhythm, no m/r/g. Pulm/chest: normal WOB on RA, CTABL. Abdomen: soft, nondistended, nontender to palpation. MSK: continues to have TTP over R lumbar paraspinal muscles. No acute tenderness over midline back. Neuro: AAOx3. CN grossly intact. Sensation intact bilaterally. Strength: 2/5 in R shoulder, 2/5 R elbow flexion, 2/5 right forearm supination, 3/5 R hand/grip, 5/5 LUE, 5/5 bilateral LE.    Filed Weights   06/04/22 0700 06/05/22 0828  Weight: 114.2 kg 108.9 kg     Intake/Output Summary (Last 24 hours) at 06/07/2022 1242 Last data filed at 06/07/2022 1100 Gross per 24  hour  Intake --  Output 2700 ml  Net -2700 ml   Net IO Since Admission: -2,730 mL [06/07/22 1242]  Pertinent Labs:    Latest Ref Rng & Units 06/06/2022    7:52 AM 06/05/2022    5:10 AM 06/04/2022    8:00 AM  CBC  WBC 4.0 - 10.5 K/uL 11.4  12.0    Hemoglobin 13.0 - 17.0 g/dL 11.9  11.8  11.9   Hematocrit 39.0 - 52.0 % 36.4  35.0  35.0   Platelets 150 - 400 K/uL 144  126         Latest Ref Rng & Units 06/07/2022    4:29 AM 06/06/2022    7:52 AM 06/05/2022    5:10 AM  CMP  Glucose 70 - 99 mg/dL 343  201  153   BUN 8 - 23 mg/dL 26  27  22   $ Creatinine 0.61 - 1.24 mg/dL 0.78  0.78  0.88   Sodium 135 - 145 mmol/L 133  133  134   Potassium 3.5 - 5.1 mmol/L 4.5  3.8  3.4   Chloride 98 - 111 mmol/L 100  100  101   CO2 22 - 32 mmol/L 21  24  22   $ Calcium 8.9 - 10.3 mg/dL 8.3  8.4  8.5   Total Protein 6.5 - 8.1 g/dL 5.9  6.3  5.7   Total Bilirubin 0.3 - 1.2 mg/dL 0.8  1.0  1.3   Alkaline Phos 38 - 126 U/L 48  49  45   AST 15 - 41 U/L 30  40  63   ALT 0 - 44 U/L 29  32  33      Assessment/Plan:   Principal Problem:   Right sided weakness Active Problems:   Weakness of right upper extremity   Patient Summary: [name] is a 77 y.o. person living with a history of chronic back pain with left lower extremity radicular pain, HTN, hypothyroidism, CAD s/p PCI 1999, T2DM, gout, GERD, hearing impairment and depression who presents after a fall out of bed and left arm weakness and admitted for further work up with concern for brachial plexus/cervical injury vs acute CVA   Right upper extremity weakness Recurrent falls Fall from bed RUE strength unchanged from yesterday. Still no sensory deficits. His C spine radiculopathy would not explain his acute symptoms, which more resemble upper trunk brachial plexus injury or other peripheral poly neuropathy. He will be undergoing MRI of brachial plexus/shoulder under general anesthesia around 3PM.  -appreciate neurology, neurosurgery  recommendations -NPO currently -f/u MRI right shoulder/brachial plexus -decadron day 2/4 -continue home ASA, plavix, lipitor -PT/OT recommending SNF, continue therapy while here -f/u PCP/cardiology as outpatient for suspected PAD and possible association with falls   Elevated CK - mild rhabdomyolysis - resolved Hematuria Elevated AST - resolved CK has normalized and so has initially elevated AST, likely were due to being down for several hours PTA. -intermittent liver enzyme checks to ensure continued resolution -Will need repeat UA outpatient to follow up hemoglobinuria   Chronic back pain Lumbar post-laminectomy syndrome Acute exacerbation of chronic back pain, no cauda equina. -continue tylenol. Adding tramadol 72m q8h today. Try to avoid opioids if possible, but okay if absolutely necessary -topical analgesics, lidocaine patch - Robaxin 7544m3 times daily - Steroids as above -- frequently move in bed to help with positioning -- PT/OT   Pyuria Patient denies urinary symptoms. Afebrile. Does have leukocytosis and UA with large leukocytes, many bacteria, few squamous epithelium, and hemoglobinuria. He received one dose of ceftriaxone in ED. Without urinary symptoms will refrain from treating further and follow cultures drawn by ED. If begins to have fevers, urinary symptoms, or growing significant amount of bacteria with cultures will reinitiate treatment. -continue to monitor for urinary symptoms and systemic signs of infection.    Leukocytosis Improving, no clear infectious symptoms. -monitor for fevers or other systemic symptoms concerning for infectious etiology   Normocytic anemia Thrombocytopenia CTM   CAD Paroxysmal atrial tachycardia Stable. -continue aspirin, plavix, and atorvastatin  -continue carvedilol   T2DM A1c 8.1. CBGs elevated in the setting of steroid initiation. Will add SSI and trend CBGs. 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 at midnight IVF: None,None VTE: Enoxaparin Code: Full PT/OT recs: Pending, .  Dispo: Pending management, PT/OT  SaVirl AxeMD PGY-3 Internal Medicine Resident Please contact the on call pager after 5 pm  and on weekends at [phone].

## 2022-06-07 NOTE — Progress Notes (Signed)
Patient's CBG was 419 at 1609. Paged the doctor and got order for 10 unit insulin administration.

## 2022-06-07 NOTE — Plan of Care (Signed)
°  Problem: Education: °Goal: Knowledge of General Education information will improve °Description: Including pain rating scale, medication(s)/side effects and non-pharmacologic comfort measures °Outcome: Progressing °  °Problem: Nutrition: °Goal: Adequate nutrition will be maintained °Outcome: Progressing °  °Problem: Skin Integrity: °Goal: Risk for impaired skin integrity will decrease °Outcome: Progressing °  °

## 2022-06-08 ENCOUNTER — Inpatient Hospital Stay (HOSPITAL_COMMUNITY): Payer: Medicare Other | Admitting: Critical Care Medicine

## 2022-06-08 ENCOUNTER — Inpatient Hospital Stay (HOSPITAL_COMMUNITY): Payer: Medicare Other

## 2022-06-08 ENCOUNTER — Encounter (HOSPITAL_COMMUNITY)
Admission: EM | Disposition: A | Discharge status: Skilled Nursing Facility | Payer: Self-pay | Source: Home / Self Care | Attending: Internal Medicine

## 2022-06-08 ENCOUNTER — Encounter (HOSPITAL_COMMUNITY): Payer: Self-pay | Admitting: Internal Medicine

## 2022-06-08 DIAGNOSIS — R296 Repeated falls: Secondary | ICD-10-CM | POA: Diagnosis not present

## 2022-06-08 DIAGNOSIS — Z87891 Personal history of nicotine dependence: Secondary | ICD-10-CM

## 2022-06-08 DIAGNOSIS — I2510 Atherosclerotic heart disease of native coronary artery without angina pectoris: Secondary | ICD-10-CM

## 2022-06-08 DIAGNOSIS — R531 Weakness: Secondary | ICD-10-CM | POA: Diagnosis not present

## 2022-06-08 DIAGNOSIS — I10 Essential (primary) hypertension: Secondary | ICD-10-CM

## 2022-06-08 HISTORY — PX: RADIOLOGY WITH ANESTHESIA: SHX6223

## 2022-06-08 LAB — GLUCOSE, CAPILLARY
Glucose-Capillary: 296 mg/dL — ABNORMAL HIGH (ref 70–99)
Glucose-Capillary: 269 mg/dL — ABNORMAL HIGH (ref 70–99)
Glucose-Capillary: 278 mg/dL — ABNORMAL HIGH (ref 70–99)
Glucose-Capillary: 276 mg/dL — ABNORMAL HIGH (ref 70–99)
Glucose-Capillary: 323 mg/dL — ABNORMAL HIGH (ref 70–99)
Glucose-Capillary: 334 mg/dL — ABNORMAL HIGH (ref 70–99)

## 2022-06-08 LAB — BASIC METABOLIC PANEL
Anion gap: 13 (ref 5–15)
BUN: 26 mg/dL — ABNORMAL HIGH (ref 8–23)
CO2: 22 mmol/L (ref 22–32)
Calcium: 8.6 mg/dL — ABNORMAL LOW (ref 8.9–10.3)
Chloride: 99 mmol/L (ref 98–111)
Creatinine, Ser: 0.75 mg/dL (ref 0.61–1.24)
GFR, Estimated: >60 mL/min (ref >60)
Glucose, Bld: 305 mg/dL — ABNORMAL HIGH (ref 70–99)
Potassium: 4.6 mmol/L (ref 3.5–5.1)
Sodium: 134 mmol/L — ABNORMAL LOW (ref 135–145)

## 2022-06-08 LAB — CBC
HCT: 37.6 % — ABNORMAL LOW (ref 39.0–52.0)
Hemoglobin: 13.1 g/dL (ref 13.0–17.0)
MCH: 31 pg (ref 26.0–34.0)
MCHC: 34.8 g/dL (ref 30.0–36.0)
MCV: 89.1 fL (ref 80.0–100.0)
Platelets: 197 10*3/uL (ref 150–400)
RBC: 4.22 MIL/uL (ref 4.22–5.81)
RDW: 12.7 % (ref 11.5–15.5)
WBC: 13.3 10*3/uL — ABNORMAL HIGH (ref 4.0–10.5)
nRBC: 0 % (ref 0.0–0.2)

## 2022-06-08 SURGERY — MRI WITH ANESTHESIA
Anesthesia: General

## 2022-06-08 MED ORDER — INSULIN ASPART 100 UNIT/ML IJ SOLN
5.0000 [IU] | Freq: Three times a day (TID) | INTRAMUSCULAR | Status: DC
  Administered 2022-06-08 (×2): 5 [IU] via SUBCUTANEOUS

## 2022-06-08 MED ORDER — LACTATED RINGERS IV SOLN: INTRAVENOUS | Status: DC

## 2022-06-08 MED ORDER — PANTOPRAZOLE SODIUM 40 MG PO TBEC: 40.0000 mg | DELAYED_RELEASE_TABLET | Freq: Every day | ORAL | Status: DC

## 2022-06-08 MED ORDER — CHLORHEXIDINE GLUCONATE 0.12 % MT SOLN
OROMUCOSAL | Status: AC
  Administered 2022-06-08: 15 mL via OROMUCOSAL
  Filled 2022-06-08: qty 15

## 2022-06-08 MED ORDER — EPHEDRINE SULFATE-NACL 50-0.9 MG/10ML-% IV SOSY
PREFILLED_SYRINGE | INTRAVENOUS | Status: DC | PRN
  Administered 2022-06-08: 5 mg via INTRAVENOUS

## 2022-06-08 MED ORDER — PANTOPRAZOLE SODIUM 20 MG PO TBEC: 20.0000 mg | DELAYED_RELEASE_TABLET | Freq: Every day | ORAL | Status: DC

## 2022-06-08 MED ORDER — SCOPOLAMINE 1 MG/3DAYS TD PT72
1.0000 | MEDICATED_PATCH | Freq: Once | TRANSDERMAL | Status: DC
  Administered 2022-06-08: 1.5 mg via TRANSDERMAL
  Filled 2022-06-08: qty 1

## 2022-06-08 MED ORDER — PANTOPRAZOLE SODIUM 20 MG PO TBEC
20.0000 mg | DELAYED_RELEASE_TABLET | Freq: Every day | ORAL | Status: DC | PRN
  Filled 2022-06-08: qty 1

## 2022-06-08 MED ORDER — FENTANYL CITRATE (PF) 100 MCG/2ML IJ SOLN
INTRAMUSCULAR | Status: DC | PRN
  Administered 2022-06-08: 100 ug via INTRAVENOUS

## 2022-06-08 MED ORDER — PHENYLEPHRINE HCL-NACL 20-0.9 MG/250ML-% IV SOLN
INTRAVENOUS | Status: DC | PRN
  Administered 2022-06-08: 40 ug/min via INTRAVENOUS

## 2022-06-08 MED ORDER — PANTOPRAZOLE SODIUM 40 MG IV SOLR
40.0000 mg | Freq: Once | INTRAVENOUS | Status: AC
  Administered 2022-06-08: 40 mg via INTRAVENOUS

## 2022-06-08 MED ORDER — CHLORHEXIDINE GLUCONATE 0.12 % MT SOLN: 15.0000 mL | Freq: Once | OROMUCOSAL | Status: AC

## 2022-06-08 MED ORDER — AMISULPRIDE (ANTIEMETIC) 5 MG/2ML IV SOLN: 10.0000 mg | Freq: Once | INTRAVENOUS | Status: DC | PRN

## 2022-06-08 MED ORDER — INSULIN ASPART 100 UNIT/ML IJ SOLN
0.0000 [IU] | INTRAMUSCULAR | Status: DC | PRN
  Administered 2022-06-08: 4 [IU] via SUBCUTANEOUS

## 2022-06-08 MED ORDER — ROCURONIUM BROMIDE 10 MG/ML (PF) SYRINGE
PREFILLED_SYRINGE | INTRAVENOUS | Status: DC | PRN
  Administered 2022-06-08: 30 mg via INTRAVENOUS

## 2022-06-08 MED ORDER — PANTOPRAZOLE SODIUM 40 MG PO TBEC
40.0000 mg | DELAYED_RELEASE_TABLET | Freq: Every day | ORAL | Status: DC
  Administered 2022-06-09 – 2022-06-10 (×2): 40 mg via ORAL
  Filled 2022-06-08 (×2): qty 1

## 2022-06-08 MED ORDER — ONDANSETRON HCL 4 MG/2ML IJ SOLN
INTRAMUSCULAR | Status: DC | PRN
  Administered 2022-06-08: 4 mg via INTRAVENOUS

## 2022-06-08 MED ORDER — DEXMEDETOMIDINE HCL IN NACL 80 MCG/20ML IV SOLN
INTRAVENOUS | Status: DC | PRN
  Administered 2022-06-08: 8 ug via BUCCAL

## 2022-06-08 MED ORDER — LIDOCAINE 2% (20 MG/ML) 5 ML SYRINGE
INTRAMUSCULAR | Status: DC | PRN
  Administered 2022-06-08: 40 mg via INTRAVENOUS

## 2022-06-08 MED ORDER — PROPOFOL 10 MG/ML IV BOLUS
INTRAVENOUS | Status: DC | PRN
  Administered 2022-06-08: 120 mg via INTRAVENOUS

## 2022-06-08 MED ORDER — SUGAMMADEX SODIUM 200 MG/2ML IV SOLN
INTRAVENOUS | Status: DC | PRN
  Administered 2022-06-08: 200 mg via INTRAVENOUS

## 2022-06-08 MED ORDER — DEXAMETHASONE SODIUM PHOSPHATE 10 MG/ML IJ SOLN
INTRAMUSCULAR | Status: DC | PRN
  Administered 2022-06-08: 5 mg via INTRAVENOUS

## 2022-06-08 MED ORDER — SUCCINYLCHOLINE CHLORIDE 200 MG/10ML IV SOSY
PREFILLED_SYRINGE | INTRAVENOUS | Status: DC | PRN
  Administered 2022-06-08: 160 mg via INTRAVENOUS

## 2022-06-08 MED ORDER — ONDANSETRON HCL 4 MG/2ML IJ SOLN: 4.0000 mg | Freq: Once | INTRAMUSCULAR | Status: DC | PRN

## 2022-06-08 MED ORDER — PHENYLEPHRINE 80 MCG/ML (10ML) SYRINGE FOR IV PUSH (FOR BLOOD PRESSURE SUPPORT)
PREFILLED_SYRINGE | INTRAVENOUS | Status: DC | PRN
  Administered 2022-06-08: 80 ug via INTRAVENOUS

## 2022-06-08 MED ORDER — ORAL CARE MOUTH RINSE: 15.0000 mL | Freq: Once | OROMUCOSAL | Status: AC

## 2022-06-08 NOTE — Progress Notes (Signed)
   Providing Compassionate, Quality Care - Together   Patient is off the floor for his shoulder MRI. No new Neurosurgical recommendations at this time.   Vital signs in last 24 hours: Temp:  [97.9 F (36.6 C)-99.5 F (37.5 C)] 99.2 F (37.3 C) (02/25 0745) Pulse Rate:  [51-93] 57 (02/25 0745) Resp:  [18-23] 23 (02/25 0745) BP: (141-161)/(56-69) 145/56 (02/25 0745) SpO2:  [96 %-100 %] 96 % (02/25 0745) Weight:  [108.9 kg] 108.9 kg (02/25 0757)  Intake/Output from previous day: 02/24 0701 - 02/25 0700 In: 360 [P.O.:360] Out: 1850 [Urine:1850] Intake/Output this shift: No intake/output data recorded.    Lab Results: Recent Labs    06/06/22 0752 06/08/22 0409  WBC 11.4* 13.3*  HGB 11.9* 13.1  HCT 36.4* 37.6*  PLT 144* 197   BMET Recent Labs    06/07/22 0429 06/07/22 1650 06/08/22 0409  NA 133*  --  134*  K 4.5  --  4.6  CL 100  --  99  CO2 21*  --  22  GLUCOSE 343* 409* 305*  BUN 26*  --  26*  CREATININE 0.78  --  0.75  CALCIUM 8.3*  --  8.6*      Viona Gilmore, DNP, AGNP-C Nurse Practitioner  [facility] [address], [address], [place], [place] [address] P: [phone]    F: [phone]  06/08/2022, 9:40 AM

## 2022-06-08 NOTE — Anesthesia Procedure Notes (Signed)
Procedure Name: Intubation Date/Time: 06/08/2022 8:42 AM  Performed by: Colin Benton, CRNAPre-anesthesia Checklist: Patient identified, Emergency Drugs available, Suction available and Patient being monitored Patient Re-evaluated:Patient Re-evaluated prior to induction Oxygen Delivery Method: Circle System Utilized Preoxygenation: Pre-oxygenation with 100% oxygen Induction Type: IV induction, Cricoid Pressure applied and Rapid sequence Laryngoscope Size: Miller and 2 Grade View: Grade I Tube type: Oral Tube size: 7.5 mm Number of attempts: 1 Airway Equipment and Method: Stylet Placement Confirmation: ETT inserted through vocal cords under direct vision, positive ETCO2 and breath sounds checked- equal and bilateral Secured at: 22 cm Tube secured with: Tape Dental Injury: Teeth and Oropharynx as per pre-operative assessment

## 2022-06-08 NOTE — Progress Notes (Signed)
OR staff called to notify that transport is arranged for 0800 am and pt. must be accompanied by RN. Will pass this information to day shift nurse.

## 2022-06-08 NOTE — Anesthesia Postprocedure Evaluation (Signed)
Anesthesia Post Note  Patient: [name]  Procedure(s) Performed: MRI WITH ANESTHESIA     Patient location during evaluation: PACU Anesthesia Type: General Level of consciousness: awake and alert Pain management: pain level controlled Vital Signs Assessment: post-procedure vital signs reviewed and stable Respiratory status: spontaneous breathing, nonlabored ventilation, respiratory function stable and patient connected to nasal cannula oxygen Cardiovascular status: blood pressure returned to baseline and stable Postop Assessment: no apparent nausea or vomiting Anesthetic complications: no  No notable events documented.  Last Vitals:  Vitals:   06/08/22 1115 06/08/22 1147  BP:  (!) 150/72  Pulse:  (!) 57  Resp:  16  Temp: 36.9 C 36.4 C  SpO2:  99%    Last Pain:  Vitals:   06/08/22 1147  TempSrc: Oral  PainSc:                  Effie Berkshire

## 2022-06-08 NOTE — Progress Notes (Signed)
Pt. c/o heartburn. Provider was paged, asked for order. Awaiting response.

## 2022-06-08 NOTE — Plan of Care (Signed)

## 2022-06-08 NOTE — Anesthesia Preprocedure Evaluation (Signed)
Anesthesia Evaluation  Patient identified by MRN, date of birth, ID band Patient awake    Reviewed: Allergy & Precautions, NPO status , Patient's Chart, lab work & pertinent test results  Airway Mallampati: I  TM Distance: >3 FB Neck ROM: Full    Dental  (+) Edentulous Upper, Edentulous Lower   Pulmonary former smoker   breath sounds clear to auscultation       Cardiovascular hypertension, Pt. on medications and Pt. on home beta blockers + CAD   Rhythm:Regular Rate:Normal     Neuro/Psych  Headaches PSYCHIATRIC DISORDERS Anxiety Depression       GI/Hepatic Neg liver ROS,GERD  ,,  Endo/Other  diabetes, Type 2, Oral Hypoglycemic AgentsHypothyroidism    Renal/GU negative Renal ROS     Musculoskeletal  (+) Arthritis ,    Abdominal   Peds  Hematology negative hematology ROS (+)   Anesthesia Other Findings   Reproductive/Obstetrics                              Anesthesia Physical Anesthesia Plan  ASA: 3  Anesthesia Plan: General   Post-op Pain Management: Minimal or no pain anticipated   Induction: Intravenous  PONV Risk Score and Plan: 2 and Ondansetron and Midazolam  Airway Management Planned: Oral ETT  Additional Equipment: None  Intra-op Plan:   Post-operative Plan: Extubation in OR  Informed Consent: I have reviewed the patients History and Physical, chart, labs and discussed the procedure including the risks, benefits and alternatives for the proposed anesthesia with the patient or authorized representative who has indicated his/her understanding and acceptance.       Plan Discussed with: CRNA  Anesthesia Plan Comments:         Anesthesia Quick Evaluation

## 2022-06-08 NOTE — Progress Notes (Signed)
Subjective:   Summary: [name] is a 77 y.o. year old male currently admitted on the IMTS HD#4 for right arm weakness.  Overnight Events:   No acute overnight events.  He got his MRI today. Awaiting results. He is feeling fine, just sore in his lower back. He was able to have some food after the MRI, tolerated it well. Still having similar difficulties with trying to bend his right elbow.  Objective:  Vital signs in last 24 hours: Vitals:   06/08/22 1051 06/08/22 1100 06/08/22 1115 06/08/22 1147  BP: 137/65 136/63  (!) 150/72  Pulse: 63 60  (!) 57  Resp: '18 19  16  '$ Temp: 98.7 F (37.1 C)  98.4 F (36.9 C) 97.6 F (36.4 C)  TempSrc:    Oral  SpO2: 94% 94%  99%  Weight:      Height:       Supplemental O2: Room Air    Physical Exam:  General: elderly male, laying in bed, NAD. CV: normal rate and regular rhythm, no m/r/g. Pulm/chest: normal WOB on RA, CTABL. Abdomen: soft, nondistended, nontender. MSK: R lumbar paraspinal muscle tenderness remains. No acute midline tenderness. Neuro: AAOx3. Sensation intact bilaterally. Strength: 3/5 R shoulder, 2/5 R elbow flexion, 2/5 R forearm supination, 4/5 R hand grip, 5/5 LUE, 5/5 bilateral LE.    Filed Weights   06/04/22 0700 06/05/22 0828 06/08/22 0757  Weight: 114.2 kg 108.9 kg 108.9 kg     Intake/Output Summary (Last 24 hours) at 06/08/2022 1344 Last data filed at 06/08/2022 1040 Gross per 24 hour  Intake 300 ml  Output 1200 ml  Net -900 ml   Net IO Since Admission: -3,270 mL [06/08/22 1344]  Pertinent Labs:    Latest Ref Rng & Units 06/08/2022    4:09 AM 06/06/2022    7:52 AM 06/05/2022    5:10 AM  CBC  WBC 4.0 - 10.5 K/uL 13.3  11.4  12.0   Hemoglobin 13.0 - 17.0 g/dL 13.1  11.9  11.8   Hematocrit 39.0 - 52.0 % 37.6  36.4  35.0   Platelets 150 - 400 K/uL 197  144  126        Latest Ref Rng & Units 06/08/2022    4:09 AM 06/07/2022    4:50 PM 06/07/2022    4:29 AM  CMP  Glucose 70 -  99 mg/dL 305  409  343   BUN 8 - 23 mg/dL 26   26   Creatinine 0.61 - 1.24 mg/dL 0.75   0.78   Sodium 135 - 145 mmol/L 134   133   Potassium 3.5 - 5.1 mmol/L 4.6   4.5   Chloride 98 - 111 mmol/L 99   100   CO2 22 - 32 mmol/L 22   21   Calcium 8.9 - 10.3 mg/dL 8.6   8.3   Total Protein 6.5 - 8.1 g/dL   5.9   Total Bilirubin 0.3 - 1.2 mg/dL   0.8   Alkaline Phos 38 - 126 U/L   48   AST 15 - 41 U/L   30   ALT 0 - 44 U/L   29      Assessment/Plan:   Principal Problem:   Right sided weakness Active Problems:   Weakness of right upper extremity   Patient Summary: [name] is a 77 y.o. person living with a  history of chronic back pain with left lower extremity radicular pain, HTN, hypothyroidism, CAD s/p PCI 1999, T2DM, gout, GERD, hearing impairment and depression who presents after a fall out of bed and left arm weakness and admitted for further work up with concern for brachial plexus/cervical injury vs acute CVA   Right upper extremity weakness Recurrent falls Fall from bed Mild improvement in RUE strength although elbow flexion remains limited. Still no sensory deficits. His C spine radiculopathy would not explain his acute symptoms, which more resemble upper trunk brachial plexus injury or other peripheral poly neuropathy. He underwent MRI of R shoulder/brachial plexus today, awaiting formal read by radiology. -appreciate neurology, neurosurgery recommendations -resumed HH diet -f/u MRI right shoulder/brachial plexus -decadron day 3/4 -continue home ASA, plavix, lipitor -PT/OT recommending SNF, continue therapy while here -f/u PCP/cardiology as outpatient for suspected PAD and possible association with falls   Elevated CK - mild rhabdomyolysis - resolved Hematuria Elevated AST - resolved CK has normalized and so has initially elevated AST, likely were due to being down for several hours PTA. -intermittent liver enzyme checks to ensure continued resolution -Will need  repeat UA outpatient to follow up hemoglobinuria   Chronic back pain Lumbar post-laminectomy syndrome Acute exacerbation of chronic back pain, no cauda equina. -continue tylenol. Adding tramadol '50mg'$  q8h today. Try to avoid opioids if possible, but okay if absolutely necessary -topical analgesics, lidocaine patch - Robaxin '750mg'$  3 times daily - Steroids as above -- frequently move in bed to help with positioning -- PT/OT   Pyuria Patient denies urinary symptoms. Afebrile. Does have leukocytosis and UA with large leukocytes, many bacteria, few squamous epithelium, and hemoglobinuria. He received one dose of ceftriaxone in ED. Without urinary symptoms will refrain from treating further and follow cultures drawn by ED. If begins to have fevers, urinary symptoms, or growing significant amount of bacteria with cultures will reinitiate treatment. -continue to monitor for urinary symptoms and systemic signs of infection.    Leukocytosis Improving, no clear infectious symptoms. -monitor for fevers or other systemic symptoms concerning for infectious etiology   Normocytic anemia Thrombocytopenia CTM   CAD Paroxysmal atrial tachycardia Stable. -continue aspirin, plavix, and atorvastatin  -continue carvedilol   T2DM A1c 8.1. CBGs elevated in the setting of steroid initiation. Will add novolog 5u TID with meals. 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  Dispo: Pending management,  PT/OT  Virl Axe, MD PGY-3 Internal Medicine Resident Please contact the on call pager after 5 pm and on weekends at [phone].

## 2022-06-08 NOTE — Interval H&P Note (Signed)
Anesthesia H&P Update: History and Physical Exam reviewed; patient is OK for planned anesthetic and procedure. ? ?

## 2022-06-08 NOTE — Transfer of Care (Signed)
Immediate Anesthesia Transfer of Care Note  Patient: [name]  Procedure(s) Performed: MRI WITH ANESTHESIA  Patient Location: PACU  Anesthesia Type:General  Level of Consciousness: drowsy and patient cooperative  Airway & Oxygen Therapy: Patient Spontanous Breathing and Patient connected to nasal cannula oxygen  Post-op Assessment: Report given to RN and Post -op Vital signs reviewed and stable  Post vital signs: Reviewed and stable  Last Vitals:  Vitals Value Taken Time  BP 137/65 06/08/22 1050  Temp    Pulse 62 06/08/22 1052  Resp 18 06/08/22 1052  SpO2 92 % 06/08/22 1052  Vitals shown include unvalidated device data.  Last Pain:  Vitals:   06/08/22 0745  TempSrc: Oral  PainSc:       Patients Stated Pain Goal: 2 ([id number])  Complications: No notable events documented.

## 2022-06-08 NOTE — Progress Notes (Signed)
Per patient, antiemetic patch placed behind his ear has been effective, help decreased his symptoms. Will continue to monitor.

## 2022-06-08 NOTE — Plan of Care (Signed)
  Problem: Education: Goal: Knowledge of General Education information will improve Description: Including pain rating scale, medication(s)/side effects and non-pharmacologic comfort measures Outcome: Progressing   Problem: Nutrition: Goal: Adequate nutrition will be maintained Outcome: Progressing   Problem: Coping: Goal: Level of anxiety will decrease Outcome: Progressing   Problem: Skin Integrity: Goal: Risk for impaired skin integrity will decrease Outcome: Progressing

## 2022-06-08 NOTE — Progress Notes (Signed)
Patient received from MRI in O2 via nasal cannula '@2lt'$ . Vital signs stable

## 2022-06-08 NOTE — Progress Notes (Signed)
BG at 0630 was 296. Patient NPO since midnight. Asked provider if it was ok to cover his BG now ( standing scale dose due at 0800 ). Per provider, hold off 0800 insulin dose since this patient is NPO. Will pass this information to day shift nurse.

## 2022-06-09 ENCOUNTER — Encounter (HOSPITAL_COMMUNITY): Payer: Self-pay | Admitting: Radiology

## 2022-06-09 DIAGNOSIS — M50122 Cervical disc disorder at C5-C6 level with radiculopathy: Secondary | ICD-10-CM

## 2022-06-09 DIAGNOSIS — M75121 Complete rotator cuff tear or rupture of right shoulder, not specified as traumatic: Secondary | ICD-10-CM

## 2022-06-09 HISTORY — DX: Cervical disc disorder at C5-C6 level with radiculopathy: M50.122

## 2022-06-09 HISTORY — DX: Complete rotator cuff tear or rupture of right shoulder, not specified as traumatic: M75.121

## 2022-06-09 LAB — BASIC METABOLIC PANEL
Anion gap: 11 (ref 5–15)
BUN: 24 mg/dL — ABNORMAL HIGH (ref 8–23)
CO2: 24 mmol/L (ref 22–32)
Calcium: 8.2 mg/dL — ABNORMAL LOW (ref 8.9–10.3)
Chloride: 97 mmol/L — ABNORMAL LOW (ref 98–111)
Creatinine, Ser: 0.7 mg/dL (ref 0.61–1.24)
GFR, Estimated: >60 mL/min (ref >60)
Glucose, Bld: 279 mg/dL — ABNORMAL HIGH (ref 70–99)
Potassium: 4.4 mmol/L (ref 3.5–5.1)
Sodium: 132 mmol/L — ABNORMAL LOW (ref 135–145)

## 2022-06-09 LAB — GLUCOSE, CAPILLARY
Glucose-Capillary: 276 mg/dL — ABNORMAL HIGH (ref 70–99)
Glucose-Capillary: 320 mg/dL — ABNORMAL HIGH (ref 70–99)
Glucose-Capillary: 199 mg/dL — ABNORMAL HIGH (ref 70–99)

## 2022-06-09 MED ORDER — INSULIN ASPART 100 UNIT/ML IJ SOLN
0.0000 [IU] | Freq: Three times a day (TID) | INTRAMUSCULAR | Status: DC
  Administered 2022-06-09: 11 [IU] via SUBCUTANEOUS
  Administered 2022-06-09: 4 [IU] via SUBCUTANEOUS
  Administered 2022-06-09: 15 [IU] via SUBCUTANEOUS
  Administered 2022-06-10 (×2): 7 [IU] via SUBCUTANEOUS

## 2022-06-09 MED ORDER — CARVEDILOL 6.25 MG PO TABS
6.2500 mg | ORAL_TABLET | Freq: Two times a day (BID) | ORAL | Status: DC
  Administered 2022-06-09: 6.25 mg via ORAL
  Filled 2022-06-09: qty 1

## 2022-06-09 MED ORDER — INSULIN ASPART 100 UNIT/ML IJ SOLN
8.0000 [IU] | Freq: Three times a day (TID) | INTRAMUSCULAR | Status: DC
  Administered 2022-06-09: 8 [IU] via SUBCUTANEOUS

## 2022-06-09 MED ORDER — INSULIN ASPART 100 UNIT/ML IJ SOLN
5.0000 [IU] | Freq: Three times a day (TID) | INTRAMUSCULAR | Status: DC
  Administered 2022-06-09 – 2022-06-10 (×4): 5 [IU] via SUBCUTANEOUS

## 2022-06-09 MED ORDER — LOSARTAN POTASSIUM 50 MG PO TABS
100.0000 mg | ORAL_TABLET | Freq: Every day | ORAL | Status: DC
  Administered 2022-06-09 – 2022-06-10 (×2): 100 mg via ORAL
  Filled 2022-06-09 (×2): qty 2

## 2022-06-09 NOTE — Progress Notes (Signed)
Subjective:   Summary: [name] is a 77 y.o. year old male currently admitted on the IMTS HD#5 for right arm weakness.  Overnight Events:   NAEON.  Of note, he is gradually becoming more bradycardic.  CBGs elevated overnight.  Right arm is still weak.  He is barely able to move it.  Endorsing some new numbness in his hand.  He has previously had this kind of numbness and tingling in his hand, but says it is worse now.  Still has some back pain but it is improved compared to before.  No other acute concerns.  Discussed results of MRI and next steps and answered all questions.    Objective:  Vital signs in last 24 hours: Vitals:   06/08/22 1610 06/08/22 2005 06/08/22 2322 06/09/22 0326  BP: (!) 158/61 (!) 126/51 (!) 138/58 (!) 153/63  Pulse: (!) 55 (!) 54 (!) 49 (!) 49  Resp: '16 16 14 16  '$ Temp: 97.6 F (36.4 C) 98.4 F (36.9 C) 98.7 F (37.1 C) 98.7 F (37.1 C)  TempSrc: Oral Oral Oral Oral  SpO2: 98% 99% 97% 98%  Weight:      Height:       Supplemental O2: Room Air    Physical Exam:  General: elderly male, laying in bed, NAD. CV: normal rate and regular rhythm, no m/r/g. Pulm/chest: normal WOB on RA, CTABL. Abdomen: soft, nondistended, nontender. MSK: R lumbar paraspinal muscle tenderness remains. No acute midline tenderness. Neuro: AAOx3.  Numbness in first through third digit of right hand and palmar and dorsal surface extending through palm into mid forearm in median distribution. Strength: 3/5 R shoulder, 2/5 R elbow flexion, 2/5 R forearm supination, 3/5 R hand grip (fourth and fifth digits stronger than first through third), 5/5 LUE, 5/5 bilateral LE.    Filed Weights   06/04/22 0700 06/05/22 0828 06/08/22 0757  Weight: 114.2 kg 108.9 kg 108.9 kg     Intake/Output Summary (Last 24 hours) at 06/09/2022 0641 Last data filed at 06/09/2022 0528 Gross per 24 hour  Intake 1020 ml  Output 1850 ml  Net -830 ml    Net IO Since Admission:  -4,400 mL [06/09/22 0641]  Pertinent Labs:    Latest Ref Rng & Units 06/08/2022    4:09 AM 06/06/2022    7:52 AM 06/05/2022    5:10 AM  CBC  WBC 4.0 - 10.5 K/uL 13.3  11.4  12.0   Hemoglobin 13.0 - 17.0 g/dL 13.1  11.9  11.8   Hematocrit 39.0 - 52.0 % 37.6  36.4  35.0   Platelets 150 - 400 K/uL 197  144  126        Latest Ref Rng & Units 06/08/2022    4:09 AM 06/07/2022    4:50 PM 06/07/2022    4:29 AM  CMP  Glucose 70 - 99 mg/dL 305  409  343   BUN 8 - 23 mg/dL 26   26   Creatinine 0.61 - 1.24 mg/dL 0.75   0.78   Sodium 135 - 145 mmol/L 134   133   Potassium 3.5 - 5.1 mmol/L 4.6   4.5   Chloride 98 - 111 mmol/L 99   100   CO2 22 - 32 mmol/L 22   21   Calcium 8.9 - 10.3 mg/dL 8.6   8.3   Total Protein 6.5 - 8.1 g/dL  5.9   Total Bilirubin 0.3 - 1.2 mg/dL   0.8   Alkaline Phos 38 - 126 U/L   48   AST 15 - 41 U/L   30   ALT 0 - 44 U/L   29   Last 3 CBGs 276, 320, 199   Assessment/Plan:   Principal Problem:   Right sided weakness Active Problems:   Weakness of right upper extremity   Patient Summary: [name] is a 77 y.o. person living with a history of chronic back pain with left lower extremity radicular pain, HTN, hypothyroidism, CAD s/p PCI 1999, T2DM, gout, GERD, hearing impairment and depression who presents after a fall out of bed and right arm weakness and admitted for further work up with C5/C6 radiculopathy and multiple right rotator cuff tears   Right upper extremity weakness Recurrent falls Fall from bed RUE exam stable.  He is still weak in shoulder abduction, elbow flexion, and forearm supination and has uneven weakness in his hand in median distribution.  He endorses new sensory deficits today as well extending from fingertips to mid forearm in median distribution.  His MRI yesterday showed diffuse inflammation in the shoulder, multiple rotator cuff tears including full supraspinatus tear and partial biceps tendon tear and bony Bankart fracture without  associated Hill-Sachs.  Orthopedics consulted yesterday and recommended conservative management and possible outpatient EMG.  Will also DC steroids. -appreciate neurology, neurosurgery, orthopedics recommendations -DC Decadron -continue home ASA, plavix, lipitor -PT/OT recommending SNF, continue therapy while here -f/u PCP/cardiology as outpatient for suspected PAD and possible association with falls - Outpatient EMG 2 weeks after initial traumatic event   Elevated CK - mild rhabdomyolysis - resolved Hematuria Elevated AST - resolved Resolved -intermittent liver enzyme checks to ensure continued resolution -Will need repeat UA outpatient to follow up hemoglobinuria   Chronic back pain Lumbar post-laminectomy syndrome Acute exacerbation of chronic back pain, no cauda equina. -continue tylenol. Adding tramadol '50mg'$  q8h today. Try to avoid opioids if possible, but okay if absolutely necessary -topical analgesics, lidocaine patch - Robaxin '750mg'$  3 times daily -- frequently move in bed to help with positioning -- PT/OT   Pyuria Patient denies urinary symptoms. Afebrile. Does have leukocytosis and UA with large leukocytes, many bacteria, few squamous epithelium, and hemoglobinuria. He received one dose of ceftriaxone in ED. Without urinary symptoms will refrain from treating further and follow cultures drawn by ED. If begins to have fevers, urinary symptoms, or growing significant amount of bacteria with cultures will reinitiate treatment. -continue to monitor for urinary symptoms and systemic signs of infection.    Leukocytosis Improving, no clear infectious symptoms. -monitor for fevers or other systemic symptoms concerning for infectious etiology   Normocytic anemia Thrombocytopenia CTM   CAD Paroxysmal atrial tachycardia Stable. -continue aspirin, plavix, and atorvastatin  -continue carvedilol   T2DM A1c 8.1. CBGs elevated in the setting of steroids which were DC'd today.   Will consider increasing insulin regimen if he continues to have elevated CBGs without steroids. -trend CBGs, goal BG 140-180 -moderate SSI -added novolog 5u TID with meals   Depression -continue sertraline   Hypothyroidism -continue home levothyroxine 50 mcg daily   HTN Hypertensive, will reinitiate home meds.  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  Dispo: Pending management, PT/OT  Linus Galas, MD PGY-1 Internal Medicine Resident Pager: [phone]  Please contact the on call pager after 5 pm and on weekends at [phone].

## 2022-06-09 NOTE — Progress Notes (Signed)
Ortho called over the phone. They will come to the floor to place patient's right arm in the sling.

## 2022-06-09 NOTE — TOC Progression Note (Signed)
Transition of Care [facility]) - Progression Note    Patient Details  Name: [name] MRN: [id number] Date of Birth: 16-Dec-1945  Transition [facility] Care [facility]) CM/SW [place], [place] Phone Number: 06/09/2022, 3:51 PM  Clinical Narrative:   CSW met with patient and sister at bedside to update that [facility] has declined to offer a bed for patient. Sister requesting [facility] instead. CSW contacted [facility] [employer] and they can offer. CSW submitted request for insurance authorization. CSW to follow.    Expected Discharge Plan: Skilled Nursing Facility Barriers to Discharge: Ship broker, Continued Medical Work up, SNF Pending bed offer  Expected Discharge Plan and Services In-house Referral: Clinical Social Work     Living arrangements for the past 2 months: Apartment                                       Social Determinants of Health (SDOH) Interventions SDOH Screenings   Food Insecurity: Unknown (06/09/2022)  Utilities: Not At Risk (06/09/2022)  Tobacco Use: High Risk (06/09/2022)    Readmission Risk Interventions     No data to display

## 2022-06-09 NOTE — Progress Notes (Signed)
Orthopedic Tech Progress Note Patient Details:  [name] September 24, 1945 [id number]  Ortho Devices Type of Ortho Device: Arm sling Ortho Device/Splint Interventions: Ordered, Application, Adjustment  I applied arm sling that was already in the room. Post Interventions Patient Tolerated: Well Instructions Provided: Care of device, Adjustment of device  Karolee Stamps 06/09/2022, 6:52 AM

## 2022-06-09 NOTE — Care Management Important Message (Signed)
Important Message  Patient Details  Name: [name] MRN: [id number] Date of Birth: [birth year]   Medicare Important Message Given:  Yes     Orbie Pyo 06/09/2022, 4:05 PM

## 2022-06-09 NOTE — Progress Notes (Signed)
Patient ID: [name], male   DOB: 1945/09/15, 77 y.o.   MRN: [id number] BP (!) 144/68 (BP Location: Left Arm)   Pulse (!) 51   Temp 98.8 F (37.1 C) (Oral)   Resp 16   Ht 5' 9.02" (1.753 m)   Wt 108.9 kg   SpO2 97%   BMI 35.43 kg/m  I have reviewed mri shoulder and plexus. It is highly unlikely that where he is solidly fused that the C6 or C7 roots are responsible for this newly found weakness. The atrophy seen in the rotator cuff indicates that he has had some impairment with his right shoulder for quite some time. The fused segment is protective of movement or acute injury to the cord, and roots. The foraminal narrowing at C5/6 is chronic, but would not cause the degenerative changes seen in the shoulder.  IM is correct, an EMG now will not dx a plexus injury, will need much more time.  [name] is reluctant to exert himself at all. Stating that since he cannot use his right upper extremity he is limited in all activities.  Agree with PT.  he does not need operative decompression of the C6 root, or C7 root.

## 2022-06-09 NOTE — Plan of Care (Signed)

## 2022-06-09 NOTE — Progress Notes (Signed)
Patient's right arm was placed in a sling by ortho staff.

## 2022-06-09 NOTE — Hospital Course (Signed)
Has been having numbness/tingling in the first few digits of right hand since his fall. Last two digits of R hand also do not feel right. The first 3 digits are more numb. Numbness only in that right hand and wrist. Had mild numbness/tingling in his extremity in the past, but it was never this bad. His back pain has continued. Cannot find a comfortable position to help his back pain.

## 2022-06-09 NOTE — Progress Notes (Signed)
Mobility Specialist: Progress Note   06/09/22 1035  Mobility  Activity Transferred from bed to chair  Level of Assistance +2 (takes two people)  Assistive Device Stedy  Activity Response Tolerated fair  Mobility Referral Yes  $Mobility charge 1 Mobility   Pt received in the bed and agreeable to mobility. ModA +1 for bed mobility and +2 maxA to stand from elevated bed height. C/o low back pain during transfer, no rating given. Pt has call bell and phone at his side. Chair alarm is on.   [place] Zalayah Pizzuto Mobility Specialist Please contact via SecureChat or Rehab office at [phone]

## 2022-06-09 NOTE — Progress Notes (Signed)
Sling was ordered. Awaiting to receive it.

## 2022-06-09 NOTE — Progress Notes (Signed)
Inpatient Diabetes Program Recommendations  AACE/ADA: New Consensus Statement on Inpatient Glycemic Control (2015)  Target Ranges:  Prepandial:   less than 140 mg/dL      Peak postprandial:   less than 180 mg/dL (1-2 hours)      Critically ill patients:  140 - 180 mg/dL   Lab Results  Component Value Date   GLUCAP 276 (H) 06/09/2022   HGBA1C 8.1 (H) 06/05/2022    Review of Glycemic Control  Latest Reference Range & Units 06/08/22 10:51 06/08/22 12:48 06/08/22 16:10 06/08/22 21:20 06/09/22 06:18  Glucose-Capillary 70 - 99 mg/dL 278 (H) 276 (H) 323 (H) 334 (H) 276 (H)   Diabetes history: DM  Outpatient Diabetes medications:  Metformin 500 mg bid Current orders for Inpatient glycemic control:  Novolog 0-20 units tid with meals and HS Novolog 8 units tid with meals Inpatient Diabetes Program Recommendations:   May consider adding Levemir 8 units bid to current insulin regimen.    Thanks,  Adah Perl, RN, BC-ADM Inpatient Diabetes Coordinator Pager [phone]  (8a-5p)

## 2022-06-09 NOTE — Progress Notes (Signed)
Physical Therapy Treatment Patient Details Name: [name] MRN: [id number] DOB: [birth year] Today's Date: 06/09/2022   History of Present Illness [name] is a 77 y/o male who presented on 06/04/22 after falling out of bed and having R UE weakness. Imaging neg for acute intracranial abnormality but did show cervical and lumbar spinal stenosis. PMH: chronic back pain with left lower extremity radicular pain, HTN, hypothyroidism, CAD s/p PCI 1999, T2DM, gout, GERD, hearing impairment and depression    PT Comments    Pt making incremental progress towards his physical therapy goals. Received sitting up in the chair; reports low back pain and nausea and requesting to return to bed. Pt requiring two person max assist to stand with lift equipment [name]), pulling up with left upper extremity. Moderate assist for BLE elevation back into bed. Applied ice packs to lower back. Pt continues with generalized weakness/debility, impaired balance, decreased functional use of RUE, and decreased activity tolerance. Recommend ST SNF to address deficits and maximize functional mobility.    Recommendations for follow up therapy are one component of a multi-disciplinary discharge planning process, led by the attending physician.  Recommendations may be updated based on patient status, additional functional criteria and insurance authorization.  Follow Up Recommendations  Skilled nursing-short term rehab (<3 hours/day) Can patient physically be transported by private vehicle: No   Assistance Recommended at Discharge Intermittent Supervision/Assistance  Patient can return home with the following Two people to help with walking and/or transfers;A lot of help with bathing/dressing/bathroom;Assistance with cooking/housework;Assist for transportation   Equipment Recommendations  Other (comment) (defer)    Recommendations for Other Services       Precautions / Restrictions Precautions Precautions:  Fall Required Braces or Orthoses: Sling Restrictions Weight Bearing Restrictions: No     Mobility  Bed Mobility Overal bed mobility: Needs Assistance Bed Mobility: Sit to Supine       Sit to supine: Mod assist   General bed mobility comments: Assist with BLE's to elevate back into bed    Transfers Overall transfer level: Needs assistance Equipment used: Ambulation equipment used Transfers: Sit to/from Stand Sit to Stand: Max assist, +2 physical assistance           General transfer comment: MaxA + 2 with use of bed pad to initiate hip extension to rise to stedy. Pt pulling up with LUE to stand. Unable to achieve fully upright posture, flexed at trunk/hips    Ambulation/Gait                   Stairs             Wheelchair Mobility    Modified Rankin (Stroke Patients Only)       Balance Overall balance assessment: Needs assistance Sitting-balance support: Feet supported Sitting balance-Leahy Scale: Fair                                      Cognition Arousal/Alertness: Awake/alert Behavior During Therapy: WFL for tasks assessed/performed Overall Cognitive Status: No family/caregiver present to determine baseline cognitive functioning                                 General Comments: Self limiting at times        Exercises      General Comments        Pertinent  Vitals/Pain Pain Assessment Pain Assessment: Faces Faces Pain Scale: Hurts whole lot Pain Location: low back Pain Descriptors / Indicators: Grimacing, Discomfort, Guarding, Moaning Pain Intervention(s): Limited activity within patient's tolerance, Monitored during session, Ice applied    Home Living                          Prior Function            PT Goals (current goals can now be found in the care plan section) Acute Rehab PT Goals Patient Stated Goal: to walk again Potential to Achieve Goals: Fair    Frequency    Min  2X/week      PT Plan Current plan remains appropriate    Co-evaluation              AM-PAC PT "6 Clicks" Mobility   Outcome Measure  Help needed turning from your back to your side while in a flat bed without using bedrails?: A Lot Help needed moving from lying on your back to sitting on the side of a flat bed without using bedrails?: A Lot Help needed moving to and from a bed to a chair (including a wheelchair)?: Total Help needed standing up from a chair using your arms (e.g., wheelchair or bedside chair)?: Total Help needed to walk in hospital room?: Total Help needed climbing 3-5 steps with a railing? : Total 6 Click Score: 8    End of Session Equipment Utilized During Treatment: Gait belt Activity Tolerance: Other (comment) (limited by pain/nausea) Patient left: in bed;with call bell/phone within reach;with bed alarm set Nurse Communication: Mobility status PT Visit Diagnosis: Unsteadiness on feet (R26.81);Other abnormalities of gait and mobility (R26.89);History of falling (Z91.81);Muscle weakness (generalized) (M62.81);Difficulty in walking, not elsewhere classified (R26.2);Pain     Time: [id number] PT Time Calculation (min) (ACUTE ONLY): 18 min  Charges:  $Therapeutic Activity: 8-22 mins                     [name], PT, DPT Acute Rehabilitation Services Office [place] 06/09/2022, 3:41 PM

## 2022-06-09 NOTE — Progress Notes (Addendum)
Dr. Markus Jarvis was made aware that patient's glucose is always elevated > 300, also about been bradycardic all the time. Will continue to monitor.  0501 am - Update: Message read by provider. No further orders provided.  [id number] am - Provider place new orders, modified insulin for the patient. No further instructions to administer insulin at this time were given.

## 2022-06-09 NOTE — Consult Note (Signed)
ORTHOPAEDIC CONSULTATION  REQUESTING PHYSICIAN: Lottie Mussel, MD  Chief Complaint: Right arm pain and weakness  HPI: [name] is a 77 y.o. male who presents with presents today with right arm pain and weakness after an unwitnessed injury to the shoulder.  Supposedly he was found laying directly on the shoulder for several hours.  He is at baseline walker dependent ambulator and as result relies heavily on the right upper extremity.  He does endorse weakness in the right arm consistently with very limited use.  He has been seen by neurology as well as neurosurgery.  He does have a history of cervical fusion several years ago but was not having issues following this.  Past Medical History:  Diagnosis Date   Anxiety    Arthritis    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[place]) 03/14/2013   Atypical chest pain 12/28/2013   Chest pain 07/30/2011   1999 Cath - <50% stenosis 2008 NUC - low risk    Chronic back pain    herniated disc   Coronary artery disease    takes Plavix and ASA daily   Depression    takes Prozac daily   Diabetes mellitus type 2, controlled, with complications ([place]) [id number]   Diverticulitis    hx of   Dizziness    occasionally and states Dr.Smith is aware   DJD (degenerative joint disease)    Essential hypertension, benign 07/30/2011   GERD (gastroesophageal reflux disease)    takes an OTC antacid   Gout    yrs ago and doesn't take any meds   Hard of hearing    wears hearing aids   Headache(784.0)    occasionally   History of colon polyps    benign   History of kidney stones    HNP (herniated nucleus pulposus), lumbar 06/14/2014   Hyperlipidemia    takes Atorvastatin daily   Hypertension    takes Amlodipine,Losartan,and Coreg daily   Hypothyroidism    takes Synthroid daily   IBS (irritable bowel syndrome)    Obesity, unspecified 07/30/2011   Shortness of breath dyspnea    with exertion   Tuberculosis     Patient denies Tb   Type II diabetes mellitus ([place])    takes Metformin daily   Unstable angina ([place]) 12/27/2013   Weakness    numbness and tingling in left leg   Past Surgical History:  Procedure Laterality Date   ANTERIOR CERVICAL DECOMP/DISCECTOMY FUSION  04/2007   C4-5; C6-7   BACK SURGERY     "3 times; last time was screws in lower back 10/2008"   CHOLECYSTECTOMY  1970's   COLONOSCOPY     CORONARY ANGIOPLASTY  1999/2013   1 stent   CYSTOSCOPY     LEFT HEART CATH AND CORONARY ANGIOGRAPHY N/A 09/30/2019   Procedure: LEFT HEART CATH AND CORONARY ANGIOGRAPHY;  Surgeon: Jettie Booze, MD;  Location: [facility];  Service: Cardiovascular;  Laterality: N/A;   LEFT HEART CATHETERIZATION WITH CORONARY ANGIOGRAM N/A 08/21/2011   Procedure: LEFT HEART CATHETERIZATION WITH CORONARY ANGIOGRAM;  Surgeon: Sinclair Grooms, MD;  Location: [facility] CATH LAB;  Service: Cardiovascular;  Laterality: N/A;   LITHOTRIPSY     LUMBAR LAMINECTOMY/DECOMPRESSION MICRODISCECTOMY Left 06/14/2014   Procedure: LEFT LUMBAR THREE-FOUR LANINOTOMY AND MICRODISKECTOMY.;  Surgeon: Hosie Spangle, MD;  Location: [facility];  Service: Neurosurgery;  Laterality: Left;  left   PCI  5/09/2-13   LAD   RADIOLOGY WITH  ANESTHESIA N/A 11/13/2015   Procedure: MRI OF LUMBAR SPINE W/WO CONTRAST    (RADIOLOGY WITH ANESTHESIA);  Surgeon: Medication Radiologist, MD;  Location: [facility];  Service: Radiology;  Laterality: N/A;   RADIOLOGY WITH ANESTHESIA N/A 06/05/2022   Procedure: MRI WITH ANESTHESIA;  Surgeon: Radiologist, Medication, MD;  Location: [facility];  Service: Radiology;  Laterality: N/A;   SHOULDER ARTHROSCOPY W/ ROTATOR CUFF REPAIR  ~ 2011   left   Social History   Socioeconomic History   Marital status: Single    Spouse name: Not on file   Number of children: Not on file   Years of education: Not on file   Highest education level: Not on file  Occupational History   Not on file  Tobacco Use   Smoking status:  Former   Smokeless tobacco: Current    Types: Chew  Substance and Sexual Activity   Alcohol use: No   Drug use: No   Sexual activity: Never  Other Topics Concern   Not on file  Social History Narrative   Not on file   Social Determinants of Health   Financial Resource Strain: Not on file  Food Insecurity: Unknown (06/09/2022)   Hunger Vital Sign    Worried About Running Out of Food in the Last Year: Patient refused    Ran Out of Food in the Last Year: Patient refused  Transportation Needs: Not on file  Physical Activity: Not on file  Stress: Not on file  Social Connections: Not on file   Family History  Problem Relation Age of Onset   Cancer Father        colon   Arthritis Mother    Healthy Sister    Heart attack Brother    Heart disease Brother    Hypertension Brother    Healthy Sister    - negative except otherwise stated in the family history section Allergies  Allergen Reactions   Sulfa Antibiotics Rash   Prior to Admission medications   Medication Sig Start Date End Date Taking? Authorizing Provider  acetaminophen (TYLENOL) 500 MG tablet Take 1,000 mg by mouth every 6 (six) hours as needed for mild pain.    Yes [provider]  amLODipine (NORVASC) 10 MG tablet Take 10 mg by mouth daily.   Yes [provider]  aspirin 81 MG tablet Take 81 mg by mouth daily.   Yes [provider]  atorvastatin (LIPITOR) 80 MG tablet Take 80 mg by mouth daily. 11/01/21  Yes [provider]  carvedilol (COREG) 25 MG tablet Take 12.5 mg by mouth 2 (two) times daily with a meal.   Yes [provider]  clopidogrel (PLAVIX) 75 MG tablet Take 75 mg by mouth daily with breakfast.   Yes [provider]  hydrochlorothiazide (HYDRODIURIL) 25 MG tablet Take 25 mg by mouth daily.    Yes [provider]  levothyroxine (SYNTHROID, LEVOTHROID) 50 MCG tablet Take 50 mcg by mouth daily before breakfast. 02/26/15  Yes [provider]  losartan (COZAAR) 100 MG tablet Take 100 mg by mouth daily.   Yes [provider]  metFORMIN (GLUCOPHAGE-XR) 500 MG 24 hr tablet Take 1 tablet (500 mg total) by mouth 2 (two) times daily. 10/02/19  Yes Jettie Booze, MD  NUCYNTA 50 MG tablet Take 100 mg by mouth every 4 (four) hours as needed for pain. 09/04/21  Yes [provider]  polyethylene glycol powder (GLYCOLAX/MIRALAX) 17 GM/SCOOP powder Take 17 g by mouth in  the morning.   Yes [provider]  sertraline (ZOLOFT) 100 MG tablet Take 150 mg by mouth 2 (two) times daily. 04/24/20  Yes [provider]  nitroGLYCERIN (NITROSTAT) 0.4 MG SL tablet Place 0.4 mg under the tongue every 5 (five) minutes as needed for chest pain.    [provider]   Joel BRACHIAL PLEXUS W/O CM RT  Result Date: 06/08/2022 CLINICAL DATA:  Right brachial plexus injury. 77 year old male with past medical history of chronic back pain and left lower extremity radicular pain, type 2 diabetes, gout, presented to the ED with left arm weakness status post fall at home in the setting of recurrent falls. Woke up on the forearm was unable to use the right arm. Brachial plexus injury versus cervical radiculopathy. Scanned under general anesthesia. EXAM: MRI BRACHIAL PLEXUS WITHOUT CONTRAST TECHNIQUE: Multiplanar, multiecho pulse sequences of the neck and surrounding structures were obtained without intravenous contrast. The field of view was focused on the rightbrachial plexus from the neural foramina to the axilla. COMPARISON:  None Available. FINDINGS: Spinal cord Normal signal and caliber of the visualized cervical and upper thoracic cord. Please see recent MRI cervical spine 06/05/2022 for evaluation of the disc levels. There is metallic artifact from C5 through C7 ACDF hardware. Brachial plexus: Please note on recent MRI of the cervical spine that moderate to severe right C5-6 neuroforaminal stenosis was seen secondary to facet and  uncovertebral hypertrophy. No space-occupying lesion is seen along the course of the roots, trunks, divisions, cords, or terminal nerve branches of the brachial plexus. Muscles and tendons As seen on contemporaneous MRI of the right shoulder, there is diffuse edema seen throughout the subscapularis muscle. However, this edema does not appear to directly involve the right brachial plexus. Please see contemporaneous MRI of the right shoulder for evaluation of the rotator cuff tendons. Bones Tiny subcortical cysts within the superior aspect of the humeral head. No acute fracture. Joints Mild-to-moderate degenerative changes of the acromioclavicular joint. Mild glenohumeral osteoarthritis. Other findings Not well evaluated on this modality, there is increased T2 signal suggesting trace right pleural effusion and for 1 year possible subsegmental atelectasis versus pneumonia within the right upper lung. IMPRESSION: 1. Please note on recent MRI of the cervical spine that moderate to severe right C5-6 neuroforaminal stenosis was seen secondary to facet and uncovertebral hypertrophy. 2. No space-occupying lesion is seen along the course of the right brachial plexus. 3. As seen on contemporaneous MRI of the right shoulder, there is diffuse edema seen throughout the right subscapularis muscle. This is asymmetric from the left subscapularis muscle imaged on the current study. However, this edema does not appear to directly involve the right brachial plexus. This is nonspecific and may be secondary to recent trauma/muscle strain for neurogenic. Electronically Signed   By: Yvonne Kendall M.D.   On: 06/08/2022 16:01   Joel SHOULDER RIGHT WO CONTRAST  Result Date: 06/08/2022 CLINICAL DATA:  Right arm weakness. Brachial plexus injury versus cervical neuropathy 06/05/2022. EXAM: MRI OF THE RIGHT SHOULDER WITHOUT CONTRAST TECHNIQUE: Multiplanar, multisequence Joel imaging of the shoulder was performed. No intravenous contrast was  administered. COMPARISON:  None Available. FINDINGS: Despite efforts by the technologist and patient, motion artifact is present on today's exam and could not be eliminated. This reduces exam sensitivity and specificity. Rotator cuff: There is a full-thickness tear of the mid to anterior aspect of the supraspinatus tendon at the critical zone, measuring up to 13 mm in AP dimension (sagittal series 27 image  9 and with a transverse tendon gap measuring 15 mm (coronal series 26 images 16 and 17). There is mild diffuse intermediate T2 signal tendinosis of the supraspinatus and infraspinatus. No infraspinatus tendon tear is seen. There is moderate partial-thickness articular sided tearing of the superior approximate 17 mm of the subscapularis tendon insertion (sagittal images 9 through 11 and axial images 9 through 12). Muscles: Moderate supraspinatus muscle atrophy and fatty infiltration. Mild fatty infiltration of the superior aspect of the infraspinatus. Mild-to-moderate superior subscapularis fatty infiltration and muscle atrophy. Complete fatty infiltration and atrophy of the teres minor muscle. No space-occupying lesion is seen within the quadrilateral space. Biceps long head: Motion artifact limits evaluation of the biceps tendon. No full-thickness tear is seen. There may be partial-thickness tearing of the biceps tendon as it courses into the bicipital groove. Acromioclavicular Joint: There are mild-to-moderate degenerative changes of the acromioclavicular joint including joint space narrowing, subchondral marrow edema, and peripheral osteophytosis. Type II acromion. Mild fluid within the subacromial/subdeltoid bursa. Glenohumeral Joint: Mild superomedial humeral head cartilage thinning. Labrum: There is moderate subchondral marrow edema within the anterior inferior aspect of the glenoid and there appears to be a cortical defect indicating an acute to subacute bony Bankart lesion. Fluid bright signal indicates an  anterior inferior quadrant chondrolabral detachment (axial series 24 images 11 through 14). Bones:  No Hill-Sachs lesion is identified. Other: None. IMPRESSION: 1. Full-thickness tear of the mid to anterior aspect of the supraspinatus tendon at the critical zone. Moderate supraspinatus muscle atrophy and fatty infiltration. 2. Moderate partial-thickness articular sided tearing of the superior subscapularis tendon insertion. Mild-to-moderate superior subscapularis fatty infiltration and muscle atrophy. 3. Complete fatty infiltration and atrophy of the teres minor muscle. No space-occupying lesion is seen within the quadrilateral space. 4. Within the high limitations of motion artifact, question partial-thickness tearing of the biceps tendon as it courses into the bicipital groove. 5. Mild-to-moderate degenerative changes of the acromioclavicular joint. 6. Probable anterior inferior glenoid bony fracture/Bankart lesion, possibly posttraumatic from recent fall. No Hill-Sachs lesion is seen to suggest definitive MRI evidence of a recent anterior shoulder dislocation. Electronically Signed   By: Yvonne Kendall M.D.   On: 06/08/2022 15:35     Positive ROS: All other systems have been reviewed and were otherwise negative with the exception of those mentioned in the HPI and as above.  Physical Exam: General: No acute distress Cardiovascular: No pedal edema Respiratory: No cyanosis, no use of accessory musculature GI: No organomegaly, abdomen is soft and non-tender Skin: No lesions in the area of chief complaint Neurologic: Sensation intact distally Psychiatric: Patient is at baseline mood and affect Lymphatic: No axillary or cervical lymphadenopathy  MUSCULOSKELETAL:  He has little to no function of the deltoid as well as biceps.  Weakly fires triceps.  Wrist extensors are intact but weak.  Sensation is intact grossly in any distributions.  No issue with passive range of motion about the  shoulder  Independent Imaging Review: MRI cervical spine, brachial plexus, MRI right shoulder: Status post C5-C6 cervical fusion.  There is some adjacent segment disease and degeneration at C5 and C6 which is mild in nature.  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  I do believe this is likely a result of him being found down and direct traction neuropathy in the setting of degenerative findings of  the spine.  I described that this is not an uncommon finding when there is tension on the nerve roots in the neck mild degenerative disease.  Overall his MRI findings on the shoulder do not explain his persistent weakness in the biceps weakness in the triceps distribution as well.  At this time I recommend an EMG nerve conduction study to further localize the level.  I did describe that these do frequently resolve over time.  Would recommend discussion with neurology following EMG nerve conduction studies.  Plan: No surgical intervention on shoulder required at this time, please do not hesitate to contact with additional questions  Thank you for the consult and the opportunity to see Joel. Johan Gilpin, MD [facility] 8:05 AM

## 2022-06-10 DIAGNOSIS — N390 Urinary tract infection, site not specified: Secondary | ICD-10-CM | POA: Diagnosis not present

## 2022-06-10 DIAGNOSIS — E785 Hyperlipidemia, unspecified: Secondary | ICD-10-CM | POA: Diagnosis not present

## 2022-06-10 DIAGNOSIS — M5412 Radiculopathy, cervical region: Secondary | ICD-10-CM

## 2022-06-10 DIAGNOSIS — R488 Other symbolic dysfunctions: Secondary | ICD-10-CM | POA: Diagnosis not present

## 2022-06-10 DIAGNOSIS — N200 Calculus of kidney: Secondary | ICD-10-CM | POA: Diagnosis not present

## 2022-06-10 DIAGNOSIS — E1165 Type 2 diabetes mellitus with hyperglycemia: Secondary | ICD-10-CM | POA: Diagnosis not present

## 2022-06-10 DIAGNOSIS — Z7409 Other reduced mobility: Secondary | ICD-10-CM | POA: Diagnosis not present

## 2022-06-10 DIAGNOSIS — I340 Nonrheumatic mitral (valve) insufficiency: Secondary | ICD-10-CM | POA: Diagnosis not present

## 2022-06-10 DIAGNOSIS — R531 Weakness: Secondary | ICD-10-CM | POA: Diagnosis not present

## 2022-06-10 DIAGNOSIS — M6281 Muscle weakness (generalized): Secondary | ICD-10-CM | POA: Diagnosis not present

## 2022-06-10 DIAGNOSIS — I2510 Atherosclerotic heart disease of native coronary artery without angina pectoris: Secondary | ICD-10-CM | POA: Diagnosis not present

## 2022-06-10 DIAGNOSIS — E039 Hypothyroidism, unspecified: Secondary | ICD-10-CM | POA: Diagnosis not present

## 2022-06-10 DIAGNOSIS — Z741 Need for assistance with personal care: Secondary | ICD-10-CM | POA: Diagnosis not present

## 2022-06-10 DIAGNOSIS — I951 Orthostatic hypotension: Secondary | ICD-10-CM | POA: Diagnosis not present

## 2022-06-10 DIAGNOSIS — G629 Polyneuropathy, unspecified: Secondary | ICD-10-CM | POA: Diagnosis not present

## 2022-06-10 DIAGNOSIS — N10 Acute tubulo-interstitial nephritis: Secondary | ICD-10-CM | POA: Diagnosis not present

## 2022-06-10 DIAGNOSIS — R319 Hematuria, unspecified: Secondary | ICD-10-CM | POA: Diagnosis not present

## 2022-06-10 DIAGNOSIS — R6889 Other general symptoms and signs: Secondary | ICD-10-CM | POA: Diagnosis not present

## 2022-06-10 DIAGNOSIS — I700 Atherosclerosis of aorta: Secondary | ICD-10-CM | POA: Diagnosis not present

## 2022-06-10 DIAGNOSIS — R52 Pain, unspecified: Secondary | ICD-10-CM | POA: Diagnosis not present

## 2022-06-10 DIAGNOSIS — I10 Essential (primary) hypertension: Secondary | ICD-10-CM | POA: Diagnosis not present

## 2022-06-10 DIAGNOSIS — Z7401 Bed confinement status: Secondary | ICD-10-CM | POA: Diagnosis not present

## 2022-06-10 DIAGNOSIS — N134 Hydroureter: Secondary | ICD-10-CM | POA: Diagnosis not present

## 2022-06-10 DIAGNOSIS — R2681 Unsteadiness on feet: Secondary | ICD-10-CM | POA: Diagnosis not present

## 2022-06-10 DIAGNOSIS — R109 Unspecified abdominal pain: Secondary | ICD-10-CM | POA: Diagnosis not present

## 2022-06-10 DIAGNOSIS — M75121 Complete rotator cuff tear or rupture of right shoulder, not specified as traumatic: Secondary | ICD-10-CM | POA: Diagnosis not present

## 2022-06-10 DIAGNOSIS — M50122 Cervical disc disorder at C5-C6 level with radiculopathy: Secondary | ICD-10-CM | POA: Diagnosis not present

## 2022-06-10 DIAGNOSIS — N132 Hydronephrosis with renal and ureteral calculous obstruction: Secondary | ICD-10-CM | POA: Diagnosis not present

## 2022-06-10 DIAGNOSIS — M109 Gout, unspecified: Secondary | ICD-10-CM | POA: Diagnosis not present

## 2022-06-10 DIAGNOSIS — I480 Paroxysmal atrial fibrillation: Secondary | ICD-10-CM | POA: Diagnosis not present

## 2022-06-10 DIAGNOSIS — Z743 Need for continuous supervision: Secondary | ICD-10-CM | POA: Diagnosis not present

## 2022-06-10 DIAGNOSIS — E119 Type 2 diabetes mellitus without complications: Secondary | ICD-10-CM | POA: Diagnosis not present

## 2022-06-10 DIAGNOSIS — F32A Depression, unspecified: Secondary | ICD-10-CM | POA: Diagnosis not present

## 2022-06-10 DIAGNOSIS — G8191 Hemiplegia, unspecified affecting right dominant side: Secondary | ICD-10-CM | POA: Diagnosis not present

## 2022-06-10 HISTORY — DX: Radiculopathy, cervical region: M54.12

## 2022-06-10 LAB — CBC
HCT: 33.8 % — ABNORMAL LOW (ref 39.0–52.0)
Hemoglobin: 11.8 g/dL — ABNORMAL LOW (ref 13.0–17.0)
MCH: 31.6 pg (ref 26.0–34.0)
MCHC: 34.9 g/dL (ref 30.0–36.0)
MCV: 90.4 fL (ref 80.0–100.0)
Platelets: 230 10*3/uL (ref 150–400)
RBC: 3.74 MIL/uL — ABNORMAL LOW (ref 4.22–5.81)
RDW: 12.8 % (ref 11.5–15.5)
WBC: 14.6 10*3/uL — ABNORMAL HIGH (ref 4.0–10.5)
nRBC: 0.1 % (ref 0.0–0.2)

## 2022-06-10 LAB — COMPREHENSIVE METABOLIC PANEL
ALT: 28 U/L (ref 0–44)
AST: 18 U/L (ref 15–41)
Albumin: 2.4 g/dL — ABNORMAL LOW (ref 3.5–5.0)
Alkaline Phosphatase: 49 U/L (ref 38–126)
Anion gap: 10 (ref 5–15)
BUN: 23 mg/dL (ref 8–23)
CO2: 24 mmol/L (ref 22–32)
Calcium: 8.2 mg/dL — ABNORMAL LOW (ref 8.9–10.3)
Chloride: 100 mmol/L (ref 98–111)
Creatinine, Ser: 0.71 mg/dL (ref 0.61–1.24)
GFR, Estimated: >60 mL/min (ref >60)
Glucose, Bld: 229 mg/dL — ABNORMAL HIGH (ref 70–99)
Potassium: 4.4 mmol/L (ref 3.5–5.1)
Sodium: 134 mmol/L — ABNORMAL LOW (ref 135–145)
Total Bilirubin: 0.9 mg/dL (ref 0.3–1.2)
Total Protein: 5.5 g/dL — ABNORMAL LOW (ref 6.5–8.1)

## 2022-06-10 LAB — GLUCOSE, CAPILLARY
Glucose-Capillary: 223 mg/dL — ABNORMAL HIGH (ref 70–99)
Glucose-Capillary: 238 mg/dL — ABNORMAL HIGH (ref 70–99)

## 2022-06-10 LAB — TSH: TSH: 4.633 u[IU]/mL — ABNORMAL HIGH (ref 0.350–4.500)

## 2022-06-10 MED ORDER — CARVEDILOL 3.125 MG PO TABS: 3.1250 mg | ORAL_TABLET | Freq: Two times a day (BID) | ORAL | 0 refills | Status: DC

## 2022-06-10 MED ORDER — MELATONIN 3 MG PO TABS: 3.0000 mg | ORAL_TABLET | Freq: Every day | ORAL | 0 refills | Status: DC

## 2022-06-10 MED ORDER — SERTRALINE HCL 100 MG PO TABS: 100.0000 mg | ORAL_TABLET | Freq: Every day | ORAL | Status: DC

## 2022-06-10 MED ORDER — TRAMADOL HCL 50 MG PO TABS: 50.0000 mg | ORAL_TABLET | Freq: Three times a day (TID) | ORAL | 0 refills | Status: DC | PRN

## 2022-06-10 MED ORDER — DICLOFENAC SODIUM 1 % EX GEL: 4.0000 g | Freq: Four times a day (QID) | CUTANEOUS | Status: DC

## 2022-06-10 MED ORDER — INSULIN ASPART 100 UNIT/ML IJ SOLN: 0.0000 [IU] | Freq: Three times a day (TID) | INTRAMUSCULAR | 11 refills | Status: DC

## 2022-06-10 MED ORDER — INSULIN GLARGINE-YFGN 100 UNIT/ML ~~LOC~~ SOLN
10.0000 [IU] | Freq: Every day | SUBCUTANEOUS | Status: DC
  Administered 2022-06-10: 10 [IU] via SUBCUTANEOUS
  Filled 2022-06-10: qty 0.1

## 2022-06-10 MED ORDER — CARVEDILOL 3.125 MG PO TABS
3.1250 mg | ORAL_TABLET | Freq: Two times a day (BID) | ORAL | Status: DC
  Administered 2022-06-10 (×2): 3.125 mg via ORAL
  Filled 2022-06-10 (×2): qty 1

## 2022-06-10 MED ORDER — METHOCARBAMOL 750 MG PO TABS: 750.0000 mg | ORAL_TABLET | Freq: Three times a day (TID) | ORAL | Status: DC

## 2022-06-10 MED ORDER — LIDOCAINE 5 % EX PTCH: 1.0000 | MEDICATED_PATCH | CUTANEOUS | 0 refills | Status: DC

## 2022-06-10 NOTE — Progress Notes (Addendum)
Inpatient Diabetes Program Recommendations  AACE/ADA: New Consensus Statement on Inpatient Glycemic Control (2015)  Target Ranges:  Prepandial:   less than 140 mg/dL      Peak postprandial:   less than 180 mg/dL (1-2 hours)      Critically ill patients:  140 - 180 mg/dL   Lab Results  Component Value Date   GLUCAP 223 (H) 06/10/2022   HGBA1C 8.1 (H) 06/05/2022    Review of Glycemic Control  Latest Reference Range & Units 06/09/22 06:18 06/09/22 12:00 06/09/22 16:31 06/10/22 06:19  Glucose-Capillary 70 - 99 mg/dL 276 (H)  Novolog 19 units 320 (H)  Novolog 20 units  Decadron 4 mg 199 (H)  Novolog 9 units 223 (H)    Diabetes history: DM  Outpatient Diabetes medications:  Metformin 500 mg bid Current orders for Inpatient glycemic control:  Novolog 0-20 units tid with meals and HS Novolog 8 units tid with meals  Decadron 4 mg given yesterday  Inpatient Diabetes Program Recommendations:    Note: Pt received a total of 48 units of Novolog yesterday  Semglee 10 units added will watch regimen  Thanks,  Tama Headings RN, MSN, BC-ADM Inpatient Diabetes Coordinator Team Pager [phone] (8a-5p)

## 2022-06-10 NOTE — Plan of Care (Signed)
Pt is discharged to SNF. PIV removed. All belongings were returned.  Problem: Education: Goal: Knowledge of General Education information will improve Description: Including pain rating scale, medication(s)/side effects and non-pharmacologic comfort measures Outcome: Completed/Met   Problem: Health Behavior/Discharge Planning: Goal: Ability to manage health-related needs will improve Outcome: Completed/Met   Problem: Clinical Measurements: Goal: Ability to maintain clinical measurements within normal limits will improve Outcome: Completed/Met Goal: Will remain free from infection Outcome: Completed/Met Goal: Diagnostic test results will improve Outcome: Completed/Met Goal: Respiratory complications will improve Outcome: Completed/Met Goal: Cardiovascular complication will be avoided Outcome: Completed/Met   Problem: Activity: Goal: Risk for activity intolerance will decrease Outcome: Completed/Met   Problem: Nutrition: Goal: Adequate nutrition will be maintained Outcome: Completed/Met   Problem: Coping: Goal: Level of anxiety will decrease Outcome: Completed/Met   Problem: Elimination: Goal: Will not experience complications related to bowel motility Outcome: Completed/Met Goal: Will not experience complications related to urinary retention Outcome: Completed/Met   Problem: Pain Managment: Goal: General experience of comfort will improve Outcome: Completed/Met   Problem: Safety: Goal: Ability to remain free from injury will improve Outcome: Completed/Met   Problem: Skin Integrity: Goal: Risk for impaired skin integrity will decrease Outcome: Completed/Met   Problem: Education: Goal: Ability to describe self-care measures that may prevent or decrease complications (Diabetes Survival Skills Education) will improve Outcome: Completed/Met Goal: Individualized Educational Video(s) Outcome: Completed/Met   Problem: Coping: Goal: Ability to adjust to condition or  change in health will improve Outcome: Completed/Met   Problem: Fluid Volume: Goal: Ability to maintain a balanced intake and output will improve Outcome: Completed/Met   Problem: Health Behavior/Discharge Planning: Goal: Ability to identify and utilize available resources and services will improve Outcome: Completed/Met Goal: Ability to manage health-related needs will improve Outcome: Completed/Met   Problem: Metabolic: Goal: Ability to maintain appropriate glucose levels will improve Outcome: Completed/Met   Problem: Nutritional: Goal: Maintenance of adequate nutrition will improve Outcome: Completed/Met Goal: Progress toward achieving an optimal weight will improve Outcome: Completed/Met   Problem: Skin Integrity: Goal: Risk for impaired skin integrity will decrease Outcome: Completed/Met   Problem: Tissue Perfusion: Goal: Adequacy of tissue perfusion will improve Outcome: Completed/Met

## 2022-06-10 NOTE — Progress Notes (Incomplete)
Subjective:   Summary: [name] is a 77 y.o. year old male currently admitted on the IMTS HD#6 for right arm weakness.  Overnight Events:   NAEON.  Stable bradycardia 40s to 50s.  CBGs elevated overnight.  Awaiting insurance authorization for transfer from rehab  Right arm is still weak.  He is barely able to move it.  Endorsing some new numbness in his hand.  He has previously had this kind of numbness and tingling in his hand, but says it is worse now.  Still has some back pain but it is improved compared to before.  No other acute concerns.  Discussed results of MRI and next steps and answered all questions. ***    Objective:  Vital signs in last 24 hours: Vitals:   06/09/22 1627 06/09/22 1957 06/09/22 2336 06/10/22 0306  BP: (!) 162/78 (!) 144/68 139/69 (!) 147/61  Pulse: (!) 54 (!) 51 (!) 51 (!) 50  Resp: '19 16 16 16  '$ Temp: 98.9 F (37.2 C) 98.8 F (37.1 C) 98.3 F (36.8 C) 98.6 F (37 C)  TempSrc: Oral Oral Oral Oral  SpO2: 98% 97% 96% 96%  Weight:      Height:       Supplemental O2: Room Air    Physical Exam:  General: elderly male, laying in bed, NAD. CV: normal rate and regular rhythm, no m/r/g. Pulm/chest: normal WOB on RA, CTABL. Abdomen: soft, nondistended, nontender. MSK: R lumbar paraspinal muscle tenderness remains. No acute midline tenderness. Neuro: AAOx3.  Numbness in first through third digit of right hand and palmar and dorsal surface extending through palm into mid forearm in median distribution. Strength: 3/5 R shoulder, 2/5 R elbow flexion, 2/5 R forearm supination, 3/5 R hand grip (fourth and fifth digits stronger than first through third), 5/5 LUE, 5/5 bilateral LE.    Filed Weights   06/04/22 0700 06/05/22 0828 06/08/22 0757  Weight: 114.2 kg 108.9 kg 108.9 kg     Intake/Output Summary (Last 24 hours) at 06/10/2022 0714 Last data filed at 06/10/2022 [id number] Gross per 24 hour  Intake 397 ml  Output 1600 ml  Net -1203  ml    Net IO Since Admission: -5,603 mL [06/10/22 0714]  Pertinent Labs:    Latest Ref Rng & Units 06/10/2022    3:10 AM 06/08/2022    4:09 AM 06/06/2022    7:52 AM  CBC  WBC 4.0 - 10.5 K/uL 14.6  13.3  11.4   Hemoglobin 13.0 - 17.0 g/dL 11.8  13.1  11.9   Hematocrit 39.0 - 52.0 % 33.8  37.6  36.4   Platelets 150 - 400 K/uL 230  197  144        Latest Ref Rng & Units 06/10/2022    3:10 AM 06/09/2022    7:30 AM 06/08/2022    4:09 AM  CMP  Glucose 70 - 99 mg/dL 229  279  305   BUN 8 - 23 mg/dL '23  24  26   '$ Creatinine 0.61 - 1.24 mg/dL 0.71  0.70  0.75   Sodium 135 - 145 mmol/L 134  132  134   Potassium 3.5 - 5.1 mmol/L 4.4  4.4  4.6   Chloride 98 - 111 mmol/L 100  97  99   CO2 22 - 32 mmol/L '24  24  22   '$ Calcium 8.9 - 10.3 mg/dL 8.2  8.2  8.6   Total Protein 6.5 - 8.1 g/dL 5.5     Total Bilirubin 0.3 - 1.2 mg/dL 0.9     Alkaline Phos 38 - 126 U/L 49     AST 15 - 41 U/L 18     ALT 0 - 44 U/L 28     Last 3 CBGs 320, 199, 223   Assessment/Plan:   Principal Problem:   Right sided weakness Active Problems:   Weakness of right upper extremity   Nontraumatic complete tear of right rotator cuff   Cervical disc 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 person living with a history of chronic back pain with left lower extremity radicular pain, HTN, hypothyroidism, CAD s/p PCI 1999, T2DM, gout, GERD, hearing impairment and depression who presents after a fall out of bed and right arm weakness and admitted for further work up with C5/C6 radiculopathy and multiple right rotator cuff tears   Right upper extremity weakness Recurrent falls Fall from bed RUE exam stable.  He is still weak in shoulder abduction, elbow flexion, and forearm supination and has uneven weakness in his hand in median distribution.  He endorses new sensory deficits today as well extending from fingertips to mid forearm in median distribution.  His MRI yesterday  showed diffuse inflammation in the shoulder, multiple rotator cuff tears including full supraspinatus tear and partial biceps tendon tear and bony Bankart fracture without associated Hill-Sachs.  Orthopedics consulted yesterday and recommended conservative management and possible outpatient EMG.  Will also DC steroids. -appreciate neurology, neurosurgery, orthopedics recommendations -DC Decadron -continue home ASA, plavix, lipitor -PT/OT recommending SNF, continue therapy while here -f/u PCP/cardiology as outpatient for suspected PAD and possible association with falls - Outpatient EMG 2 weeks after initial traumatic event   Elevated CK - mild rhabdomyolysis - resolved Hematuria Elevated AST - resolved Resolved -intermittent liver enzyme checks to ensure continued resolution -Will need repeat UA outpatient to follow up hemoglobinuria   Chronic back pain Lumbar post-laminectomy syndrome Acute exacerbation of chronic back pain, no cauda equina. -continue tylenol. Adding tramadol '50mg'$  q8h today. Try to avoid opioids if possible, but okay if absolutely necessary -topical analgesics, lidocaine patch - Robaxin '750mg'$  3 times daily -- frequently move in bed to help with positioning -- PT/OT   Pyuria Patient denies urinary symptoms. Afebrile. Does have leukocytosis and UA with large leukocytes, many bacteria, few squamous epithelium, and hemoglobinuria. He received one dose of ceftriaxone in ED. Without urinary symptoms will refrain from treating further and follow cultures drawn by ED. If begins to have fevers, urinary symptoms, or growing significant amount of bacteria with cultures will reinitiate treatment. -continue to monitor for urinary symptoms and systemic signs of infection.    Leukocytosis Improving, no clear infectious symptoms. -monitor for fevers or other systemic symptoms concerning for infectious etiology   Normocytic anemia Thrombocytopenia CTM   CAD Paroxysmal atrial  tachycardia Stable. -continue aspirin, plavix, and atorvastatin  -continue carvedilol   T2DM A1c 8.1. CBGs elevated in the setting of steroids which were DC'd today.  Will consider increasing insulin regimen if he continues to have elevated CBGs without steroids. -trend CBGs, goal BG 140-180 -moderate SSI -added novolog 5u TID with meals   Depression -continue sertraline   Hypothyroidism -continue home levothyroxine 50 mcg daily   HTN Hypertensive, will reinitiate home meds.  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  Dispo: Pending management, PT/OT  Linus Galas, MD PGY-1 Internal Medicine Resident Pager: [phone]  Please contact the on call pager after 5 pm and on weekends at [phone].

## 2022-06-10 NOTE — Progress Notes (Signed)
Report called to receiving [facility] [employer]. Bedside staff apprised.

## 2022-06-10 NOTE — TOC Transition Note (Signed)
Transition of Care [facility]) - CM/SW Discharge Note   Patient Details  Name: [name] MRN: [id number] Date of Birth: Dec 19, 1945  Transition of Care [facility]) CM/SW Contact:  Geralynn Ochs, LCSW Phone Number: 06/10/2022, 2:50 PM   Clinical Narrative:   CSW received insurance authorization for patient to admit to [facility], confirmed with MD that patient is medically stable. CSW sent discharge information to [facility], confirmed bed availability. CSW updated sister, [name], who is in agreement. Transport arranged with [facility] for next available.  Nurse to call report to [phone], Room 214.    Final next level of care: Skilled Nursing Facility Barriers to Discharge: Barriers Resolved   Patient Goals and CMS Choice CMS Medicare.gov Compare Post Acute Care list provided to:: Patient Choice offered to / list presented to : Patient  Discharge Placement                Patient chooses bed at:  [facility]) Patient to be transferred to facility by: [facility] Name of family member notified: [name] Patient and family notified of of transfer: 06/10/22  Discharge Plan and Services Additional resources added to the After Visit Summary for   In-house Referral: Clinical Social Work                                   Social Determinants of Health ([place]) Interventions SDOH Screenings   Food Insecurity: Unknown (06/09/2022)  Utilities: Not At Risk (06/09/2022)  Tobacco Use: High Risk (06/09/2022)     Readmission Risk Interventions     No data to display

## 2022-06-10 NOTE — Discharge Summary (Signed)
Name: [name] MRN: [id number] DOB: [birth year] 77 y.o. PCP: Joel Bender, PA-C  Date of Admission: 06/04/2022  7:50 AM Date of Discharge:06/10/2022 Attending Physician: Dr.  Cain Harris  Discharge Diagnosis: Principal Problem:   Right sided weakness Active Problems:   Weakness of right upper extremity   Nontraumatic complete tear of right rotator cuff   Cervical disc disorder at C5-C6 level with radiculopathy   Cervical radiculopathy at C5    Discharge Medications: Allergies as of 06/10/2022       Reactions   Sulfa Antibiotics Rash        Medication List     STOP taking these medications    hydrochlorothiazide 25 MG tablet Commonly known as: HYDRODIURIL   Nucynta 50 MG tablet Generic drug: tapentadol       TAKE these medications    acetaminophen 500 MG tablet Commonly known as: TYLENOL Take 1,000 mg by mouth every 6 (six) hours as needed for mild pain.   amLODipine 10 MG tablet Commonly known as: NORVASC Take 10 mg by mouth daily.   aspirin 81 MG tablet Take 81 mg by mouth daily.   atorvastatin 80 MG tablet Commonly known as: LIPITOR Take 80 mg by mouth daily.   carvedilol 3.125 MG tablet Commonly known as: COREG Take 1 tablet (3.125 mg total) by mouth 2 (two) times daily with a meal. What changed:  medication strength how much to take   clopidogrel 75 MG tablet Commonly known as: PLAVIX Take 75 mg by mouth daily with breakfast.   diclofenac Sodium 1 % Gel Commonly known as: VOLTAREN Apply 4 g topically 4 (four) times daily.   insulin aspart 100 UNIT/ML injection Commonly known as: novoLOG Inject 0-20 Units into the skin 3 (three) times daily with meals.   levothyroxine 50 MCG tablet Commonly known as: SYNTHROID Take 50 mcg by mouth daily before breakfast.   lidocaine 5 % Commonly known as: LIDODERM Place 1 patch onto the skin daily. Remove & Discard patch within 12 hours or as directed by MD   losartan 100 MG tablet Commonly  known as: COZAAR Take 100 mg by mouth daily.   melatonin 3 MG Tabs tablet Take 1 tablet (3 mg total) by mouth at bedtime.   metFORMIN 500 MG 24 hr tablet Commonly known as: GLUCOPHAGE-XR Take 1 tablet (500 mg total) by mouth 2 (two) times daily.   methocarbamol 750 MG tablet Commonly known as: ROBAXIN Take 1 tablet (750 mg total) by mouth 3 (three) times daily.   nitroGLYCERIN 0.4 MG SL tablet Commonly known as: NITROSTAT Place 0.4 mg under the tongue every 5 (five) minutes as needed for chest pain.   polyethylene glycol powder 17 GM/SCOOP powder Commonly known as: GLYCOLAX/MIRALAX Take 17 g by mouth in the morning.   sertraline 100 MG tablet Commonly known as: ZOLOFT Take 1 tablet (100 mg total) by mouth at bedtime. What changed:  how much to take when to take this   traMADol 50 MG tablet Commonly known as: ULTRAM Take 1 tablet (50 mg total) by mouth every 8 (eight) hours as needed for moderate pain or severe pain.        Disposition and follow-up:   [name] was discharged from [facility] in Stable condition.  At the hospital follow up visit please address:  1.  Follow-up:  a.  He is not an operative candidate for his rotator cuff tear or his chronic cervical radiculopathy.  Hopefully he will improve  with PT.  If his right shoulder and arm function does not improve after 2 to 3 weeks, he can pursue EMG in the outpatient setting.   b.  Holding HCTZ at discharge due to hyponatremia.  Restarted amlodipine and losartan.  Reduced dose of Coreg to 3.125 mg twice daily because of bradycardia.  Can modify as needed.   c.  Discharging on SSI.  Of note, he was only started on insulin inpatient because he was briefly on Decadron.  Last dose of Decadron was yesterday.  His insulin requirements should decrease quickly after discharge.   d.  Pain regimen at discharge includes Tylenol, tramadol 3 times daily as needed, Voltaren gel and lidocaine patch,  Robaxin 3 times daily as needed.  Hopefully his pain will also improve with PT.   E.  Outpatient UA to follow-up on resolution of hematuria  2.  Labs / imaging needed at time of follow-up: CBC, CMP, UA  3.  Pending labs/ test needing follow-up: None  Follow-up Appointments:  Follow-up Information     Joel Bender, PA-C. Schedule an appointment as soon as possible for a visit.   Specialty: Physician Assistant Contact information: [place] [address] [phone]                 Hospital Course by problem list: [name] is a 77 y.o. person living with a history of chronic back pain with left lower extremity radicular pain, HTN, hypothyroidism, CAD s/p PCI 1999, T2DM, gout, GERD, hearing impairment and depression who presents after a fall out of bed and right arm weakness and admitted for further work up with C5/C6 radiculopathy and multiple right rotator cuff tears   Right upper extremity weakness Recurrent falls Fall from bed Patient presented with right upper extremity weakness after unwitnessed fall from bed and laying on the right arm for an unknown period of time. He lost consciousness, but is not on anticoagulation. Head trauma initially ruled out with CTH. MRI evaluation for stroke and spine trauma was delayed because he requires anesthesia for MRI but was obtained on hospital day 2 revealing no new acute infarct and multilevel degenerative disease including c spine neuroformainal stenosis. Neurosurgery and neurology consulted. Ruled out seizure with initial EEG and low suspicion overall without clear seizure like activity, incontinence, lack of postictal state.  He was initially managed conservatively with pain management, PT, Decadron to relieve any perineural inflammation or spinal stenosis.  His R arm weakness persisted with worse weakness in shoulder abduction and elbow, forearm supination and so R shoulder/ R brachial plexus MRI were obtained to  further assess which revealed diffuse inflammation in the shoulder, multiple rotator cuff tears including full supraspinatus tear and partial biceps tendon tear and bony Bankart fracture without associated Hill-Sachs.  Overall source of his weakness is thought to be multifactorial due to rotator cuff tears and possible nerve traction on C-spine nerve roots or peripheral nerves.  Neurosurgery and orthopedics reevaluated and both determined he is not an operative candidate.  PT/OT determined SNF was appropriate for further outpatient rehabilitation.  Sling was placed over right arm, primarily for his comfort.  He does not actually require immobilization.  He can require EMG in the outpatient setting 2 to 3 weeks after initial fall to further assess for C5/C6 radiculopathy or peripheral neuropathy contribution to right arm weakness if necessary.  Elevated CK - mild rhabdomyolysis Hematuria Elevated AST  CK level of 4,751. Hemoglobinuria with some RBC. Likely from patient  being down on ground for extended period of time. Isolated AST also explained by this, do not suspect hepatic pathology.  These findings quickly resolved with initial supportive measures.  He will need outpatient UA to follow-up on resolution of hematuria.   Chronic back pain Lumbar post-laminectomy syndrome Hx of back pain secondary in cervical and lumbar spine. Herniated disc at L3-L4, lumbar spondylosis and radiculopathy. 2016 he underwent L3 hemilaminotomy and microdiscectomy. S/p anterior cervical discectomy 2009.  Previously followed with [facility] pain clinic where he receives tapentadol does not appear he is still following with them. Last fill date of 11/26/21, no longer being prescribed or following with them.  Endorse some leg weakness on initial presentation as well as possible saddle paresthesias.  MRI L-spine obtained which does not show any cauda equina syndrome but he does have moderate stenosis of his L-spine.  Chronic pain  management continued with Tylenol, tramadol, Robaxin, Voltaren gel, lidocaine patch.   CAD Paroxysmal atrial tachycardia Follows with Dr. Harriet Masson of [facility]. Hx of CAD PCI in 2013. Most recent catheterization in 2021 with no further intervention. Denied chest pain, low ssupciian for cardiac etiology for his presentation.  Of note, he became bradycardic after continuation of his Coreg dose in the hospital (initially presented with heart rate in the 90s).  He is a poor historian and suspect that he has been missing doses of Coreg at home.  He says that he was only actually taking Coreg 12.5 daily at home instead of twice daily as prescribed.  Decreased Coreg dose in the hospital with subsequent improvement in his bradycardia.  He will be discharged on Coreg 3.125 twice daily.  HTN Hyponatremia Hypertensive, will reinitiate losartan 100 mg daily and amlodipine 10 mg daily.  Of note, continue to hold HCTZ in the setting of hyponatremia.  T2DM A1c 8.1. CBGs elevated in the setting of steroids.  Managed on SSI plus meal coverage plus long-acting insulin during the admission.  Will be discharged on just SSI.  Of note his insulin requirement will likely decrease over time after Decadron is able to be excreted from the system  Pyuria Patient denies urinary symptoms. Afebrile. Does have leukocytosis and UA with large leukocytes, many bacteria, few squamous epithelium, and hemoglobinuria. He received one dose of ceftriaxone in ED. Without urinary symptoms will refrain from treating further and follow cultures drawn by ED. he did not have any urinary symptoms during the admission   Leukocytosis Isolated leukocytosis with neutrophilic predominance. He has remained afebrile during his hospitalization. Chest xray without focal opacity. While UA with pyuria and bacteruria, he is asymptomatic denying urinary symptoms. Likely from his rhabdomyolysis and being down extended period of time.  Leukocytosis improved throughout  hospital stay.  Of note, he did continue to have leukocytosis but this is likely because he was initiated on Decadron as well.   Normocytic anemia Thrombocytopenia No hx of prior anemia or thrombocytopenia. Repeat tomorrow morning and if persistent or worsening would obtain reticulocyte and blood smear. No active sign of bleeding. Do not suspect hemolysis.  Remained stable during admission.  Depression He mentions that he takes sertraline 150 mg twice daily at home.  Uncertain if this dose is actually prescribed to him as it is significantly elevated.  Will decrease dosage to 100 nightly in the setting of initiating tramadol for chronic pain.  Can titrate outpatient as needed.  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  He still has persistent back pain but it is controlled enough that he can move around.  No new symptoms.  Is amenable to discharge to SNF today and continuing PT.  Discharge Vitals:   BP (!) 143/67 (BP Location: Left Wrist)   Pulse 68   Temp 98.3 F (36.8 C) (Oral)   Resp 20   Ht 5' 9.02" (1.753 m)   Wt 108.9 kg   SpO2 98%   BMI 35.43 kg/m  Discharge exam: General: elderly male, laying in bed, NAD. CV: normal rate and regular rhythm, no m/r/g. Pulm/chest: normal WOB on RA, CTABL. Abdomen: soft, nondistended, nontender. MSK: R lumbar paraspinal muscle tenderness remains. No acute midline tenderness. Neuro: AAOx3.  No sensory deficits today. Strength: 2/5 R shoulder abduction, 2/5 R elbow flexion, 2/5 R forearm supination, 4/5 R hand grip (fourth and fifth digits stronger than first through third), 5/5 LUE, 5/5 bilateral LE.    Pertinent Labs, Studies, and Procedures:     Latest Ref Rng & Units 06/10/2022     3:10 AM 06/08/2022    4:09 AM 06/06/2022    7:52 AM  CBC  WBC 4.0 - 10.5 K/uL 14.6  13.3  11.4   Hemoglobin 13.0 - 17.0 g/dL 11.8  13.1  11.9   Hematocrit 39.0 - 52.0 % 33.8  37.6  36.4   Platelets 150 - 400 K/uL 230  197  144        Latest Ref Rng & Units 06/10/2022    3:10 AM 06/09/2022    7:30 AM 06/08/2022    4:09 AM  CMP  Glucose 70 - 99 mg/dL 229  279  305   BUN 8 - 23 mg/dL '23  24  26   '$ Creatinine 0.61 - 1.24 mg/dL 0.71  0.70  0.75   Sodium 135 - 145 mmol/L 134  132  134   Potassium 3.5 - 5.1 mmol/L 4.4  4.4  4.6   Chloride 98 - 111 mmol/L 100  97  99   CO2 22 - 32 mmol/L '24  24  22   '$ Calcium 8.9 - 10.3 mg/dL 8.2  8.2  8.6   Total Protein 6.5 - 8.1 g/dL 5.5     Total Bilirubin 0.3 - 1.2 mg/dL 0.9     Alkaline Phos 38 - 126 U/L 49     AST 15 - 41 U/L 18     ALT 0 - 44 U/L 28       MR Cervical Spine Wo Contrast  Result Date: 06/05/2022 CLINICAL DATA:  RUE weakness, neck pain; RUE weakness; back pain sp fall, weakness EXAM: MRI HEAD WITHOUT CONTRAST MRI CERVICAL AND LUMBAR SPINE WITHOUT CONTRAST TECHNIQUE: Multiplanar, multiecho pulse sequences of the brain and surrounding structures were obtained without contrast. Multiplanar and multiecho pulse sequences of the cervical spine, to include the craniocervical junction and cervicothoracic junction, and thoracic and lumbar spine, were obtained without intravenous contrast. COMPARISON:  MRI lumbar spine 11/13/2015. CT of the cervical spine June 04, 2022. FINDINGS: MRI HEAD FINDINGS Brain: No evidence of acute infarct, acute hemorrhage, mass lesion or midline shift. No hydrocephalus. Patchy T2/FLAIR hyperintensities within the white matter, compatible with chronic microvascular ischemic disease that is mild for age. Vascular: Major arterial flow voids are maintained at the skull base. Skull base and calvarium: Normal marrow signal. Sinuses/orbits: No  acute findings. MRI CERVICAL SPINE FINDINGS Alignment: No substantial sagittal  subluxation. Vertebrae: Vertebral body heights are maintained. C5-C7 ACDF. No focal marrow edema to suggest acute fracture or discitis/osteomyelitis. No suspicious lesions. Cord: Normal cord signal. Posterior Fossa, vertebral arteries, paraspinal tissues: Vertebral artery flow voids are maintained. No acute findings. Disc levels: C2-C3: Left eccentric mild disc bulging/endplate spurring and facet/uncovertebral hypertrophy. No significant stenosis. C3-C4: Small posterior disc osteophyte complex with left greater than right facet and uncovertebral hypertrophy. Resulting moderate left foraminal stenosis. Patent canal. Borderline mild canal stenosis. C4-C5: Posterior disc osteophyte complex. Ligamentum flavum thickening. Bilateral facet uncovertebral hypertrophy. Mild bilateral foraminal stenosis. Mild to moderate canal stenosis. C5-C6: ACDF. Patent canal. Moderate to severe right foraminal stenosis due to facet uncovertebral hypertrophy. Patent left foramen. C6-C7: Posterior disc osteophyte complex. Patent canal and foramina. C7-T1: Facet arthropathy.  No significant stenosis. MRI LUMBAR SPINE FINDINGS Segmentation:  Standard segmentation is assumed. Alignment:  No substantial sagittal subluxation.  L4-S1 PLIF. Vertebrae: In degenerative/discogenic endplate signal changes at L3-L4 with disc edema which likely relates to degenerative change. This is adjacent level to the PLIF. Otherwise, no focal marrow edema or suspicious bone lesion. Conus medullaris and cauda equina: Conus extends to the L1 level. Conus appears normal. Paraspinal and other soft tissues: Partially imaged left renal cyst. Disc levels: T12-L1: Disc bulging. Bilateral facet arthropathy. No significant stenosis. L1-L2: Bilateral facet arthropathy.  No significant stenosis. L2-L3: Disc bulging and bilateral facet arthropathy with ligamentum flavum thickening prominent dorsal epidural fat. Resulting moderate canal stenosis. Mild left foraminal stenosis.  Findings are progressed. L3-L4: Posterior disc bulging and endplate spurring. Severe bilateral facet arthropathy. Resulting moderate left greater than right foraminal stenosis. The canal is patent status post decompression. L4-L5: PLIF.  Posterior decompression.  Patent canal and foramina. L5-S1: PLIF and posterior decompression.  Patent canal and foramina. IMPRESSION: MRI head: 1. No acute abnormality. 2. 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 MRI cervical spine: 1. At C5-C6, moderate to severe right foraminal stenosis which is not substantially changed 2. At C3-C4, mild to moderate left foraminal stenosis which is progressed. 3. At C4-C5, mild to moderate canal stenosis with mild bilateral foraminal stenosis which is progressed. 4. C5-C7 ACDF. MRI lumbar spine: 1. At L2-L3, progressive moderate canal stenosis with left subarticular recess narrowing and mild left foraminal stenosis. 2. At L3-L4, mildly progressive moderate left greater than right foraminal stenosis. Subarticular recess narrowing at this level with patent canal status post decompression. 3. L4-S1 PLIF with patent canal and foramina at these levels. Electronically Signed   By: Margaretha Sheffield M.D.   On: 06/05/2022 13:29   MR LUMBAR SPINE WO CONTRAST  Result Date: 06/05/2022 CLINICAL DATA:  RUE weakness, neck pain; RUE weakness; back pain sp fall, weakness EXAM: MRI HEAD WITHOUT CONTRAST MRI CERVICAL AND LUMBAR SPINE WITHOUT CONTRAST TECHNIQUE: Multiplanar, multiecho pulse sequences of the brain and surrounding structures were obtained without contrast. Multiplanar and multiecho pulse sequences of the cervical spine, to include the craniocervical junction and cervicothoracic junction, and thoracic and lumbar spine, were obtained without intravenous contrast. COMPARISON:  MRI lumbar spine 11/13/2015. CT of the cervical spine June 04, 2022. FINDINGS: MRI HEAD FINDINGS Brain: No evidence of acute infarct, acute hemorrhage, mass  lesion or midline shift. No hydrocephalus. Patchy T2/FLAIR hyperintensities within the white matter, compatible with chronic microvascular ischemic disease that is mild for age. Vascular: Major arterial flow voids are maintained at the skull base. Skull base and calvarium: Normal marrow signal. Sinuses/orbits: No acute  findings. MRI CERVICAL SPINE FINDINGS Alignment: No substantial sagittal subluxation. Vertebrae: Vertebral body heights are maintained. C5-C7 ACDF. No focal marrow edema to suggest acute fracture or discitis/osteomyelitis. No suspicious lesions. Cord: Normal cord signal. Posterior Fossa, vertebral arteries, paraspinal tissues: Vertebral artery flow voids are maintained. No acute findings. Disc levels: C2-C3: Left eccentric mild disc bulging/endplate spurring and facet/uncovertebral hypertrophy. No significant stenosis. C3-C4: Small posterior disc osteophyte complex with left greater than right facet and uncovertebral hypertrophy. Resulting moderate left foraminal stenosis. Patent canal. Borderline mild canal stenosis. C4-C5: Posterior disc osteophyte complex. Ligamentum flavum thickening. Bilateral facet uncovertebral hypertrophy. Mild bilateral foraminal stenosis. Mild to moderate canal stenosis. C5-C6: ACDF. Patent canal. Moderate to severe right foraminal stenosis due to facet uncovertebral hypertrophy. Patent left foramen. C6-C7: Posterior disc osteophyte complex. Patent canal and foramina. C7-T1: Facet arthropathy.  No significant stenosis. MRI LUMBAR SPINE FINDINGS Segmentation:  Standard segmentation is assumed. Alignment:  No substantial sagittal subluxation.  L4-S1 PLIF. Vertebrae: In degenerative/discogenic endplate signal changes at L3-L4 with disc edema which likely relates to degenerative change. This is adjacent level to the PLIF. Otherwise, no focal marrow edema or suspicious bone lesion. Conus medullaris and cauda equina: Conus extends to the L1 level. Conus appears normal. Paraspinal  and other soft tissues: Partially imaged left renal cyst. Disc levels: T12-L1: Disc bulging. Bilateral facet arthropathy. No significant stenosis. L1-L2: Bilateral facet arthropathy.  No significant stenosis. L2-L3: Disc bulging and bilateral facet arthropathy with ligamentum flavum thickening prominent dorsal epidural fat. Resulting moderate canal stenosis. Mild left foraminal stenosis. Findings are progressed. L3-L4: Posterior disc bulging and endplate spurring. Severe bilateral facet arthropathy. Resulting moderate left greater than right foraminal stenosis. The canal is patent status post decompression. L4-L5: PLIF.  Posterior decompression.  Patent canal and foramina. L5-S1: PLIF and posterior decompression.  Patent canal and foramina. IMPRESSION: MRI head: 1. No acute abnormality. 2. 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 MRI cervical spine: 1. At C5-C6, moderate to severe right foraminal stenosis which is not substantially changed 2. At C3-C4, mild to moderate left foraminal stenosis which is progressed. 3. At C4-C5, mild to moderate canal stenosis with mild bilateral foraminal stenosis which is progressed. 4. C5-C7 ACDF. MRI lumbar spine: 1. At L2-L3, progressive moderate canal stenosis with left subarticular recess narrowing and mild left foraminal stenosis. 2. At L3-L4, mildly progressive moderate left greater than right foraminal stenosis. Subarticular recess narrowing at this level with patent canal status post decompression. 3. L4-S1 PLIF with patent canal and foramina at these levels. Electronically Signed   By: Margaretha Sheffield M.D.   On: 06/05/2022 13:29   MR BRAIN WO CONTRAST  Result Date: 06/05/2022 CLINICAL DATA:  RUE weakness, neck pain; RUE weakness; back pain sp fall, weakness EXAM: MRI HEAD WITHOUT CONTRAST MRI CERVICAL AND LUMBAR SPINE WITHOUT CONTRAST TECHNIQUE: Multiplanar, multiecho pulse sequences of the brain and surrounding structures were obtained without contrast.  Multiplanar and multiecho pulse sequences of the cervical spine, to include the craniocervical junction and cervicothoracic junction, and thoracic and lumbar spine, were obtained without intravenous contrast. COMPARISON:  MRI lumbar spine 11/13/2015. CT of the cervical spine June 04, 2022. FINDINGS: MRI HEAD FINDINGS Brain: No evidence of acute infarct, acute hemorrhage, mass lesion or midline shift. No hydrocephalus. Patchy T2/FLAIR hyperintensities within the white matter, compatible with chronic microvascular ischemic disease that is mild for age. Vascular: Major arterial flow voids are maintained at the skull base. Skull base and calvarium: Normal marrow signal. Sinuses/orbits: No acute findings. MRI CERVICAL  SPINE FINDINGS Alignment: No substantial sagittal subluxation. Vertebrae: Vertebral body heights are maintained. C5-C7 ACDF. No focal marrow edema to suggest acute fracture or discitis/osteomyelitis. No suspicious lesions. Cord: Normal cord signal. Posterior Fossa, vertebral arteries, paraspinal tissues: Vertebral artery flow voids are maintained. No acute findings. Disc levels: C2-C3: Left eccentric mild disc bulging/endplate spurring and facet/uncovertebral hypertrophy. No significant stenosis. C3-C4: Small posterior disc osteophyte complex with left greater than right facet and uncovertebral hypertrophy. Resulting moderate left foraminal stenosis. Patent canal. Borderline mild canal stenosis. C4-C5: Posterior disc osteophyte complex. Ligamentum flavum thickening. Bilateral facet uncovertebral hypertrophy. Mild bilateral foraminal stenosis. Mild to moderate canal stenosis. C5-C6: ACDF. Patent canal. Moderate to severe right foraminal stenosis due to facet uncovertebral hypertrophy. Patent left foramen. C6-C7: Posterior disc osteophyte complex. Patent canal and foramina. C7-T1: Facet arthropathy.  No significant stenosis. MRI LUMBAR SPINE FINDINGS Segmentation:  Standard segmentation is assumed.  Alignment:  No substantial sagittal subluxation.  L4-S1 PLIF. Vertebrae: In degenerative/discogenic endplate signal changes at L3-L4 with disc edema which likely relates to degenerative change. This is adjacent level to the PLIF. Otherwise, no focal marrow edema or suspicious bone lesion. Conus medullaris and cauda equina: Conus extends to the L1 level. Conus appears normal. Paraspinal and other soft tissues: Partially imaged left renal cyst. Disc levels: T12-L1: Disc bulging. Bilateral facet arthropathy. No significant stenosis. L1-L2: Bilateral facet arthropathy.  No significant stenosis. L2-L3: Disc bulging and bilateral facet arthropathy with ligamentum flavum thickening prominent dorsal epidural fat. Resulting moderate canal stenosis. Mild left foraminal stenosis. Findings are progressed. L3-L4: Posterior disc bulging and endplate spurring. Severe bilateral facet arthropathy. Resulting moderate left greater than right foraminal stenosis. The canal is patent status post decompression. L4-L5: PLIF.  Posterior decompression.  Patent canal and foramina. L5-S1: PLIF and posterior decompression.  Patent canal and foramina. IMPRESSION: MRI head: 1. No acute abnormality. 2. 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 MRI cervical spine: 1. At C5-C6, moderate to severe right foraminal stenosis which is not substantially changed 2. At C3-C4, mild to moderate left foraminal stenosis which is progressed. 3. At C4-C5, mild to moderate canal stenosis with mild bilateral foraminal stenosis which is progressed. 4. C5-C7 ACDF. MRI lumbar spine: 1. At L2-L3, progressive moderate canal stenosis with left subarticular recess narrowing and mild left foraminal stenosis. 2. At L3-L4, mildly progressive moderate left greater than right foraminal stenosis. Subarticular recess narrowing at this level with patent canal status post decompression. 3. L4-S1 PLIF with patent canal and foramina at these levels. Electronically Signed    By: Margaretha Sheffield M.D.   On: 06/05/2022 13:29   EEG adult  Result Date: 06/04/2022 Lora Havens, MD     06/04/2022  4:04 PM Patient Name: COREE HAGENOW MRN: [id number] Epilepsy Attending: Lora Havens Referring Physician/Provider: Lorenza Chick, MD Date: 06/04/2022 Duration: 21.10 mins Patient history: 87 to M presenting with acute right upper extremity weakness after recent fall with associated numbness. EEG to evaluate for seizure Level of alertness: Awake AEDs during EEG study: None Technical aspects: This EEG study was done with scalp electrodes positioned according to the 10-20 International system of electrode placement. Electrical activity was reviewed with band pass filter of 1-'70Hz'$ , sensitivity of 7 uV/mm, display speed of 83m/sec with a '60Hz'$  notched filter applied as appropriate. EEG data were recorded continuously and digitally stored.  Video monitoring was available and reviewed as appropriate. Description: No clear posterior dominant rhythm was seen. EEG showed continuous generalized 3 to 7 Hz theta-delta slowing.  Hyperventilation and photic stimulation were not performed.   Of note, EEG was technically difficult due to significant electrode artifact. ABNORMALITY - Continuous slow, generalized IMPRESSION: This technically difficult study is suggestive of moderate diffuse encephalopathy, nonspecific etiology.  No seizures or epileptiform discharges were seen throughout the recording. If suspicion for interictal activity remains a concern, a repeat study can be considered. Lora Havens   DG Chest Portable 1 View  Result Date: 06/04/2022 CLINICAL DATA:  Chills, weakness, possible stroke, fell yesterday, RIGHT arm weakness, history coronary artery disease, hypertension, diabetes mellitus, former smoker EXAM: PORTABLE CHEST 1 VIEW COMPARISON:  Portable exam 0854 hours compared to 11/08/2015 FINDINGS: Upper normal size of cardiac silhouette. Mediastinal contours and pulmonary  vascularity normal. Lungs clear. No infiltrate, pleural effusion or pneumothorax. Prior cervical spine fusion and chronic LEFT rotator cuff tear noted. IMPRESSION: No acute abnormalities. Electronically Signed   By: Lavonia Dana M.D.   On: 06/04/2022 09:07   CT Cervical Spine Wo Contrast  Result Date: 06/04/2022 CLINICAL DATA:  Back pain after a fall EXAM: CT CERVICAL, THORACIC, AND LUMBAR SPINE WITHOUT CONTRAST TECHNIQUE: Multidetector CT imaging of the cervical, thoracic and lumbar spine was performed without intravenous contrast. Multiplanar CT image reconstructions were also generated. RADIATION DOSE REDUCTION: This exam was performed according to the departmental dose-optimization program which includes automated exposure control, adjustment of the mA and/or kV according to patient size and/or use of iterative reconstruction technique. COMPARISON:  Cervical spine CT 11/14/2017, CT abdomen/pelvis 12/07/2016, lumbar spine MRI 11/13/2015, two-view chest radiograph 11/08/2015 FINDINGS: CT CERVICAL SPINE FINDINGS Alignment: Normal. There is no antero or retrolisthesis. There is no jumped or perched facet or other evidence of traumatic malalignment. Skull base and vertebrae: Skull base alignment is maintained. Vertebral body heights are preserved. There is no evidence of acute fracture. There is no suspicious osseous lesion postsurgical changes reflecting C5 through C7 ACDF are noted. Hardware alignment is within expected limits, without evidence of complication. There is solid fusion across the disc spaces. Soft tissues and spinal canal: No prevertebral fluid or swelling. No visible canal hematoma. Disc levels: There is mild degenerative change at the C1-C2 articulation with degenerative pannus about the dens. There is no evidence of significant narrowing of the craniocervical junction. There is multilevel facet arthropathy most advanced on the left at C2-C3 and C3-C4. There is no evidence of high-grade spinal  canal or neural foraminal stenosis. CT THORACIC SPINE FINDINGS Alignment: Normal. There is no jumped or perched facet or other evidence of traumatic malalignment. Vertebrae: Vertebral body heights are preserved. There is no evidence of acute fracture. There is no suspicious osseous lesion. Paraspinal and other soft tissues: Unremarkable. Disc levels: There is multilevel degenerative endplate change with flowing anterior osteophytes in the thoracic spine consistent with diffuse idiopathic skeletal hyperostosis. There is overall mild multilevel facet arthropathy. There is no evidence of significant spinal canal or neural foraminal stenosis. CT LUMBAR SPINE FINDINGS Segmentation: Standard; the lowest formed disc space is designated L5-S1. Alignment: Normal. There is no jumped or perched facet or other evidence of traumatic malalignment. Vertebrae: Vertebral body heights are preserved. There is no evidence of acute fracture. There is no suspicious osseous lesion. Postsurgical changes reflecting posterior instrumented fusion and decompression there is solid fusion across the disc spaces and posterior elements. From L3 through S1 are noted. There is no evidence of hardware related complication. Paraspinal and other soft tissues: There is a 0.8 cm nonobstructing right renal stone. A left upper pole renal cyst  is noted requiring no specific imaging follow-up. Disc levels: T12-L1: There is a partially calcified disc protrusion resulting mild spinal canal stenosis. No significant neural foraminal stenosis L1-L2: No significant spinal canal or neural foraminal stenosis L2-L3: There is a disc bulge, degenerative endplate spurring, and mild facet arthropathy resulting in severe spinal canal stenosis and mild left and no significant right neural foraminal stenosis L3-L4: There is a disc bulge, degenerative endplate spurring, and moderate bilateral facet arthropathy resulting severe spinal canal stenosis and moderate to severe  bilateral neural foraminal stenosis L4-L5: Status post posterior instrumented fusion and decompression without convincing evidence of significant spinal canal or neural foraminal stenosis L5-S1: Status post posterior instrumented fusion and decompression. Endplate spurring resulting in mild-to-moderate bilateral neural foraminal stenosis. No evidence of significant spinal canal stenosis IMPRESSION: 1. No evidence of acute fracture or traumatic malalignment of the cervical, thoracic, or lumbar spine. 2. Postsurgical changes in the cervical and lumbar spine as above without evidence of complication. 3. Multifactorial severe spinal canal stenosis at L2-L3 and L3-L4 and moderate to severe bilateral neural foraminal stenosis at L3-L4. 4. 0.8 cm nonobstructing right renal stone. Electronically Signed   By: Valetta Mole M.D.   On: 06/04/2022 08:34   CT Lumbar Spine Wo Contrast  Result Date: 06/04/2022 CLINICAL DATA:  Back pain after a fall EXAM: CT CERVICAL, THORACIC, AND LUMBAR SPINE WITHOUT CONTRAST TECHNIQUE: Multidetector CT imaging of the cervical, thoracic and lumbar spine was performed without intravenous contrast. Multiplanar CT image reconstructions were also generated. RADIATION DOSE REDUCTION: This exam was performed according to the departmental dose-optimization program which includes automated exposure control, adjustment of the mA and/or kV according to patient size and/or use of iterative reconstruction technique. COMPARISON:  Cervical spine CT 11/14/2017, CT abdomen/pelvis 12/07/2016, lumbar spine MRI 11/13/2015, two-view chest radiograph 11/08/2015 FINDINGS: CT CERVICAL SPINE FINDINGS Alignment: Normal. There is no antero or retrolisthesis. There is no jumped or perched facet or other evidence of traumatic malalignment. Skull base and vertebrae: Skull base alignment is maintained. Vertebral body heights are preserved. There is no evidence of acute fracture. There is no suspicious osseous lesion  postsurgical changes reflecting C5 through C7 ACDF are noted. Hardware alignment is within expected limits, without evidence of complication. There is solid fusion across the disc spaces. Soft tissues and spinal canal: No prevertebral fluid or swelling. No visible canal hematoma. Disc levels: There is mild degenerative change at the C1-C2 articulation with degenerative pannus about the dens. There is no evidence of significant narrowing of the craniocervical junction. There is multilevel facet arthropathy most advanced on the left at C2-C3 and C3-C4. There is no evidence of high-grade spinal canal or neural foraminal stenosis. CT THORACIC SPINE FINDINGS Alignment: Normal. There is no jumped or perched facet or other evidence of traumatic malalignment. Vertebrae: Vertebral body heights are preserved. There is no evidence of acute fracture. There is no suspicious osseous lesion. Paraspinal and other soft tissues: Unremarkable. Disc levels: There is multilevel degenerative endplate change with flowing anterior osteophytes in the thoracic spine consistent with diffuse idiopathic skeletal hyperostosis. There is overall mild multilevel facet arthropathy. There is no evidence of significant spinal canal or neural foraminal stenosis. CT LUMBAR SPINE FINDINGS Segmentation: Standard; the lowest formed disc space is designated L5-S1. Alignment: Normal. There is no jumped or perched facet or other evidence of traumatic malalignment. Vertebrae: Vertebral body heights are preserved. There is no evidence of acute fracture. There is no suspicious osseous lesion. Postsurgical changes reflecting posterior instrumented fusion and  decompression there is solid fusion across the disc spaces and posterior elements. From L3 through S1 are noted. There is no evidence of hardware related complication. Paraspinal and other soft tissues: There is a 0.8 cm nonobstructing right renal stone. A left upper pole renal cyst is noted requiring no  specific imaging follow-up. Disc levels: T12-L1: There is a partially calcified disc protrusion resulting mild spinal canal stenosis. No significant neural foraminal stenosis L1-L2: No significant spinal canal or neural foraminal stenosis L2-L3: There is a disc bulge, degenerative endplate spurring, and mild facet arthropathy resulting in severe spinal canal stenosis and mild left and no significant right neural foraminal stenosis L3-L4: There is a disc bulge, degenerative endplate spurring, and moderate bilateral facet arthropathy resulting severe spinal canal stenosis and moderate to severe bilateral neural foraminal stenosis L4-L5: Status post posterior instrumented fusion and decompression without convincing evidence of significant spinal canal or neural foraminal stenosis L5-S1: Status post posterior instrumented fusion and decompression. Endplate spurring resulting in mild-to-moderate bilateral neural foraminal stenosis. No evidence of significant spinal canal stenosis IMPRESSION: 1. No evidence of acute fracture or traumatic malalignment of the cervical, thoracic, or lumbar spine. 2. Postsurgical changes in the cervical and lumbar spine as above without evidence of complication. 3. Multifactorial severe spinal canal stenosis at L2-L3 and L3-L4 and moderate to severe bilateral neural foraminal stenosis at L3-L4. 4. 0.8 cm nonobstructing right renal stone. Electronically Signed   By: Valetta Mole M.D.   On: 06/04/2022 08:34   CT Thoracic Spine Wo Contrast  Result Date: 06/04/2022 CLINICAL DATA:  Back pain after a fall EXAM: CT CERVICAL, THORACIC, AND LUMBAR SPINE WITHOUT CONTRAST TECHNIQUE: Multidetector CT imaging of the cervical, thoracic and lumbar spine was performed without intravenous contrast. Multiplanar CT image reconstructions were also generated. RADIATION DOSE REDUCTION: This exam was performed according to the departmental dose-optimization program which includes automated exposure control,  adjustment of the mA and/or kV according to patient size and/or use of iterative reconstruction technique. COMPARISON:  Cervical spine CT 11/14/2017, CT abdomen/pelvis 12/07/2016, lumbar spine MRI 11/13/2015, two-view chest radiograph 11/08/2015 FINDINGS: CT CERVICAL SPINE FINDINGS Alignment: Normal. There is no antero or retrolisthesis. There is no jumped or perched facet or other evidence of traumatic malalignment. Skull base and vertebrae: Skull base alignment is maintained. Vertebral body heights are preserved. There is no evidence of acute fracture. There is no suspicious osseous lesion postsurgical changes reflecting C5 through C7 ACDF are noted. Hardware alignment is within expected limits, without evidence of complication. There is solid fusion across the disc spaces. Soft tissues and spinal canal: No prevertebral fluid or swelling. No visible canal hematoma. Disc levels: There is mild degenerative change at the C1-C2 articulation with degenerative pannus about the dens. There is no evidence of significant narrowing of the craniocervical junction. There is multilevel facet arthropathy most advanced on the left at C2-C3 and C3-C4. There is no evidence of high-grade spinal canal or neural foraminal stenosis. CT THORACIC SPINE FINDINGS Alignment: Normal. There is no jumped or perched facet or other evidence of traumatic malalignment. Vertebrae: Vertebral body heights are preserved. There is no evidence of acute fracture. There is no suspicious osseous lesion. Paraspinal and other soft tissues: Unremarkable. Disc levels: There is multilevel degenerative endplate change with flowing anterior osteophytes in the thoracic spine consistent with diffuse idiopathic skeletal hyperostosis. There is overall mild multilevel facet arthropathy. There is no evidence of significant spinal canal or neural foraminal stenosis. CT LUMBAR SPINE FINDINGS Segmentation: Standard; the lowest formed  disc space is designated L5-S1.  Alignment: Normal. There is no jumped or perched facet or other evidence of traumatic malalignment. Vertebrae: Vertebral body heights are preserved. There is no evidence of acute fracture. There is no suspicious osseous lesion. Postsurgical changes reflecting posterior instrumented fusion and decompression there is solid fusion across the disc spaces and posterior elements. From L3 through S1 are noted. There is no evidence of hardware related complication. Paraspinal and other soft tissues: There is a 0.8 cm nonobstructing right renal stone. A left upper pole renal cyst is noted requiring no specific imaging follow-up. Disc levels: T12-L1: There is a partially calcified disc protrusion resulting mild spinal canal stenosis. No significant neural foraminal stenosis L1-L2: No significant spinal canal or neural foraminal stenosis L2-L3: There is a disc bulge, degenerative endplate spurring, and mild facet arthropathy resulting in severe spinal canal stenosis and mild left and no significant right neural foraminal stenosis L3-L4: There is a disc bulge, degenerative endplate spurring, and moderate bilateral facet arthropathy resulting severe spinal canal stenosis and moderate to severe bilateral neural foraminal stenosis L4-L5: Status post posterior instrumented fusion and decompression without convincing evidence of significant spinal canal or neural foraminal stenosis L5-S1: Status post posterior instrumented fusion and decompression. Endplate spurring resulting in mild-to-moderate bilateral neural foraminal stenosis. No evidence of significant spinal canal stenosis IMPRESSION: 1. No evidence of acute fracture or traumatic malalignment of the cervical, thoracic, or lumbar spine. 2. Postsurgical changes in the cervical and lumbar spine as above without evidence of complication. 3. Multifactorial severe spinal canal stenosis at L2-L3 and L3-L4 and moderate to severe bilateral neural foraminal stenosis at L3-L4. 4. 0.8 cm  nonobstructing right renal stone. Electronically Signed   By: Valetta Mole M.D.   On: 06/04/2022 08:34   CT HEAD CODE STROKE WO CONTRAST  Result Date: 06/04/2022 CLINICAL DATA:  Code stroke. EXAM: CT HEAD WITHOUT CONTRAST TECHNIQUE: Contiguous axial images were obtained from the base of the skull through the vertex without intravenous contrast. RADIATION DOSE REDUCTION: This exam was performed according to the departmental dose-optimization program which includes automated exposure control, adjustment of the mA and/or kV according to patient size and/or use of iterative reconstruction technique. COMPARISON:  CT head 11/14/2017 FINDINGS: Brain: There is no acute intracranial hemorrhage, extra-axial fluid collection, or acute infarct. Parenchymal volume is normal for age. The ventricles are normal in size. Gray-white differentiation is preserved patchy hypodensity in the supratentorial white matter likely reflects sequela of chronic small-vessel ischemic change. The pituitary and suprasellar region are normal. There is no mass lesion. There is no mass effect or midline shift. Vascular: There is calcification of the bilateral carotid siphons and vertebral arteries. No hyperdense vessel is seen. Skull: Choose Sinuses/Orbits: The paranasal sinuses are clear. Bilateral lens implants are in place. The globes and orbits are otherwise unremarkable. Other: None. ASPECTS [facility] Stroke Program Early CT Score) - Ganglionic level infarction (caudate, lentiform nuclei, internal capsule, insula, M1-M3 cortex): 7 - Supraganglionic infarction (M4-M6 cortex): 3 Total score (0-10 with 10 being normal): 10 IMPRESSION: No acute intracranial pathology. Findings communicated to Dr Curly Shores via Shea Evans at 8:11 am. Electronically Signed   By: Valetta Mole M.D.   On: 06/04/2022 08:12     Discharge Instructions: Discharge Instructions     Diet - low sodium heart healthy   Complete by: As directed    Discharge instructions   Complete  by: As directed    1.  Please continue PTU for as long as necessary to improve  function and your right arm and shoulder.  After couple weeks, you can check in with your primary doctor about obtaining an EMG in the outpatient setting. If your right arm gets better, you may not need an EMG. If it stays weak, we recommend getting one in 2-3 weeks. 2.  We will be discharging you on a pain regimen of Tylenol 1000 every 6 hours, tramadol 50 every 8 hours, Robaxin-750 3 times daily, and lidocaine patch/Voltaren gel as needed for your back pain.  This can be modified at your rehab facility as needed 3.  Please hold HCTZ in the setting of low sodium.  You can continue amlodipine 10 mg daily and losartan 100 mg daily.  We also decreased your carvedilol dose to 3.125 mg twice a day because of low heart rate. 4.  Continue sliding scale insulin as needed.  Of note, his last dose of dexamethasone was a day ago so your insulin requirement should decrease over the next couple days. Patient was NOT previously on insulin, he needed it in the setting of steroids, so I don't think this will be needed long term.  5.  Please follow-up with your primary doctor after discharge from nursing facility.   Increase activity slowly   Complete by: As directed        Discharge Instructions   None     Signed: Linus Galas, MD 06/10/2022, 1:45 PM   Pager: [phone]

## 2022-06-11 DIAGNOSIS — M75121 Complete rotator cuff tear or rupture of right shoulder, not specified as traumatic: Secondary | ICD-10-CM | POA: Diagnosis not present

## 2022-06-16 DIAGNOSIS — G8191 Hemiplegia, unspecified affecting right dominant side: Secondary | ICD-10-CM | POA: Diagnosis not present

## 2022-06-17 DIAGNOSIS — E1165 Type 2 diabetes mellitus with hyperglycemia: Secondary | ICD-10-CM | POA: Diagnosis not present

## 2022-06-19 DIAGNOSIS — E1165 Type 2 diabetes mellitus with hyperglycemia: Secondary | ICD-10-CM | POA: Diagnosis not present

## 2022-06-23 DIAGNOSIS — G629 Polyneuropathy, unspecified: Secondary | ICD-10-CM | POA: Diagnosis not present

## 2022-06-25 DIAGNOSIS — R52 Pain, unspecified: Secondary | ICD-10-CM | POA: Diagnosis not present

## 2022-06-26 DIAGNOSIS — R109 Unspecified abdominal pain: Secondary | ICD-10-CM | POA: Diagnosis not present

## 2022-06-26 DIAGNOSIS — N10 Acute tubulo-interstitial nephritis: Secondary | ICD-10-CM | POA: Diagnosis not present

## 2022-06-26 DIAGNOSIS — N390 Urinary tract infection, site not specified: Secondary | ICD-10-CM | POA: Diagnosis not present

## 2022-07-01 DIAGNOSIS — N390 Urinary tract infection, site not specified: Secondary | ICD-10-CM | POA: Diagnosis not present

## 2022-07-02 DIAGNOSIS — N390 Urinary tract infection, site not specified: Secondary | ICD-10-CM | POA: Diagnosis not present

## 2022-07-04 DIAGNOSIS — I700 Atherosclerosis of aorta: Secondary | ICD-10-CM | POA: Diagnosis not present

## 2022-07-04 DIAGNOSIS — R319 Hematuria, unspecified: Secondary | ICD-10-CM | POA: Diagnosis not present

## 2022-07-04 DIAGNOSIS — R109 Unspecified abdominal pain: Secondary | ICD-10-CM | POA: Diagnosis not present

## 2022-07-04 DIAGNOSIS — N200 Calculus of kidney: Secondary | ICD-10-CM | POA: Diagnosis not present

## 2022-07-04 DIAGNOSIS — N132 Hydronephrosis with renal and ureteral calculous obstruction: Secondary | ICD-10-CM | POA: Diagnosis not present

## 2022-07-04 DIAGNOSIS — Z743 Need for continuous supervision: Secondary | ICD-10-CM | POA: Diagnosis not present

## 2022-07-04 DIAGNOSIS — N134 Hydroureter: Secondary | ICD-10-CM | POA: Diagnosis not present

## 2022-07-15 DIAGNOSIS — I951 Orthostatic hypotension: Secondary | ICD-10-CM | POA: Diagnosis not present

## 2022-07-16 DIAGNOSIS — I951 Orthostatic hypotension: Secondary | ICD-10-CM | POA: Diagnosis not present

## 2022-07-17 DIAGNOSIS — I951 Orthostatic hypotension: Secondary | ICD-10-CM | POA: Diagnosis not present

## 2022-07-19 DIAGNOSIS — E119 Type 2 diabetes mellitus without complications: Secondary | ICD-10-CM | POA: Diagnosis not present

## 2022-07-22 DIAGNOSIS — F32A Depression, unspecified: Secondary | ICD-10-CM | POA: Diagnosis not present

## 2022-07-24 ENCOUNTER — Ambulatory Visit: Payer: Medicare Other | Admitting: Cardiology

## 2022-07-25 DIAGNOSIS — I340 Nonrheumatic mitral (valve) insufficiency: Secondary | ICD-10-CM | POA: Diagnosis not present

## 2022-07-31 DIAGNOSIS — N200 Calculus of kidney: Secondary | ICD-10-CM | POA: Diagnosis not present

## 2022-08-01 ENCOUNTER — Emergency Department (HOSPITAL_COMMUNITY): Payer: Medicare Other

## 2022-08-01 ENCOUNTER — Other Ambulatory Visit: Payer: Self-pay

## 2022-08-01 ENCOUNTER — Inpatient Hospital Stay (HOSPITAL_COMMUNITY)
Admission: EM | Admit: 2022-08-01 | Discharge: 2022-08-07 | DRG: 871 | Disposition: A | Discharge status: Skilled Nursing Facility | Payer: Medicare Other | Attending: Internal Medicine | Admitting: Internal Medicine

## 2022-08-01 DIAGNOSIS — H905 Unspecified sensorineural hearing loss: Secondary | ICD-10-CM | POA: Diagnosis present

## 2022-08-01 DIAGNOSIS — E669 Obesity, unspecified: Secondary | ICD-10-CM | POA: Diagnosis present

## 2022-08-01 DIAGNOSIS — E039 Hypothyroidism, unspecified: Secondary | ICD-10-CM | POA: Diagnosis not present

## 2022-08-01 DIAGNOSIS — N390 Urinary tract infection, site not specified: Principal | ICD-10-CM | POA: Diagnosis present

## 2022-08-01 DIAGNOSIS — M62838 Other muscle spasm: Secondary | ICD-10-CM | POA: Diagnosis not present

## 2022-08-01 DIAGNOSIS — I4719 Other supraventricular tachycardia: Secondary | ICD-10-CM | POA: Diagnosis not present

## 2022-08-01 DIAGNOSIS — Z7409 Other reduced mobility: Secondary | ICD-10-CM | POA: Diagnosis present

## 2022-08-01 DIAGNOSIS — M545 Low back pain, unspecified: Secondary | ICD-10-CM | POA: Diagnosis not present

## 2022-08-01 DIAGNOSIS — G8929 Other chronic pain: Secondary | ICD-10-CM | POA: Diagnosis not present

## 2022-08-01 DIAGNOSIS — I700 Atherosclerosis of aorta: Secondary | ICD-10-CM | POA: Diagnosis not present

## 2022-08-01 DIAGNOSIS — M5412 Radiculopathy, cervical region: Secondary | ICD-10-CM | POA: Diagnosis present

## 2022-08-01 DIAGNOSIS — M79603 Pain in arm, unspecified: Secondary | ICD-10-CM | POA: Diagnosis not present

## 2022-08-01 DIAGNOSIS — Z79890 Hormone replacement therapy (postmenopausal): Secondary | ICD-10-CM | POA: Diagnosis not present

## 2022-08-01 DIAGNOSIS — N179 Acute kidney failure, unspecified: Secondary | ICD-10-CM

## 2022-08-01 DIAGNOSIS — I959 Hypotension, unspecified: Secondary | ICD-10-CM | POA: Diagnosis not present

## 2022-08-01 DIAGNOSIS — E871 Hypo-osmolality and hyponatremia: Secondary | ICD-10-CM | POA: Diagnosis present

## 2022-08-01 DIAGNOSIS — R531 Weakness: Secondary | ICD-10-CM | POA: Diagnosis not present

## 2022-08-01 DIAGNOSIS — Z720 Tobacco use: Secondary | ICD-10-CM

## 2022-08-01 DIAGNOSIS — Z9861 Coronary angioplasty status: Secondary | ICD-10-CM

## 2022-08-01 DIAGNOSIS — E861 Hypovolemia: Secondary | ICD-10-CM | POA: Diagnosis not present

## 2022-08-01 DIAGNOSIS — N281 Cyst of kidney, acquired: Secondary | ICD-10-CM | POA: Diagnosis not present

## 2022-08-01 DIAGNOSIS — Z79899 Other long term (current) drug therapy: Secondary | ICD-10-CM

## 2022-08-01 DIAGNOSIS — Z974 Presence of external hearing-aid: Secondary | ICD-10-CM

## 2022-08-01 DIAGNOSIS — Z1152 Encounter for screening for COVID-19: Secondary | ICD-10-CM

## 2022-08-01 DIAGNOSIS — F419 Anxiety disorder, unspecified: Secondary | ICD-10-CM | POA: Diagnosis present

## 2022-08-01 DIAGNOSIS — M4802 Spinal stenosis, cervical region: Secondary | ICD-10-CM | POA: Diagnosis present

## 2022-08-01 DIAGNOSIS — M79651 Pain in right thigh: Secondary | ICD-10-CM | POA: Diagnosis not present

## 2022-08-01 DIAGNOSIS — R6889 Other general symptoms and signs: Secondary | ICD-10-CM | POA: Diagnosis not present

## 2022-08-01 DIAGNOSIS — K5900 Constipation, unspecified: Secondary | ICD-10-CM | POA: Diagnosis not present

## 2022-08-01 DIAGNOSIS — Z7982 Long term (current) use of aspirin: Secondary | ICD-10-CM

## 2022-08-01 DIAGNOSIS — Z981 Arthrodesis status: Secondary | ICD-10-CM

## 2022-08-01 DIAGNOSIS — Z86010 Personal history of colonic polyps: Secondary | ICD-10-CM

## 2022-08-01 DIAGNOSIS — I2510 Atherosclerotic heart disease of native coronary artery without angina pectoris: Secondary | ICD-10-CM | POA: Diagnosis not present

## 2022-08-01 DIAGNOSIS — Z794 Long term (current) use of insulin: Secondary | ICD-10-CM

## 2022-08-01 DIAGNOSIS — D649 Anemia, unspecified: Secondary | ICD-10-CM | POA: Diagnosis present

## 2022-08-01 DIAGNOSIS — K219 Gastro-esophageal reflux disease without esophagitis: Secondary | ICD-10-CM | POA: Diagnosis present

## 2022-08-01 DIAGNOSIS — M549 Dorsalgia, unspecified: Secondary | ICD-10-CM | POA: Diagnosis not present

## 2022-08-01 DIAGNOSIS — F32A Depression, unspecified: Secondary | ICD-10-CM | POA: Diagnosis not present

## 2022-08-01 DIAGNOSIS — I10 Essential (primary) hypertension: Secondary | ICD-10-CM | POA: Diagnosis present

## 2022-08-01 DIAGNOSIS — Z7902 Long term (current) use of antithrombotics/antiplatelets: Secondary | ICD-10-CM

## 2022-08-01 DIAGNOSIS — R079 Chest pain, unspecified: Secondary | ICD-10-CM | POA: Diagnosis not present

## 2022-08-01 DIAGNOSIS — H9193 Unspecified hearing loss, bilateral: Secondary | ICD-10-CM | POA: Diagnosis not present

## 2022-08-01 DIAGNOSIS — Z7901 Long term (current) use of anticoagulants: Secondary | ICD-10-CM | POA: Diagnosis not present

## 2022-08-01 DIAGNOSIS — E785 Hyperlipidemia, unspecified: Secondary | ICD-10-CM | POA: Diagnosis present

## 2022-08-01 DIAGNOSIS — R6521 Severe sepsis with septic shock: Secondary | ICD-10-CM | POA: Diagnosis not present

## 2022-08-01 DIAGNOSIS — N200 Calculus of kidney: Secondary | ICD-10-CM | POA: Diagnosis not present

## 2022-08-01 DIAGNOSIS — Z9181 History of falling: Secondary | ICD-10-CM | POA: Diagnosis not present

## 2022-08-01 DIAGNOSIS — A4159 Other Gram-negative sepsis: Principal | ICD-10-CM | POA: Diagnosis present

## 2022-08-01 DIAGNOSIS — Z6836 Body mass index (BMI) 36.0-36.9, adult: Secondary | ICD-10-CM

## 2022-08-01 DIAGNOSIS — A419 Sepsis, unspecified organism: Secondary | ICD-10-CM | POA: Diagnosis not present

## 2022-08-01 DIAGNOSIS — Z7984 Long term (current) use of oral hypoglycemic drugs: Secondary | ICD-10-CM

## 2022-08-01 DIAGNOSIS — R296 Repeated falls: Secondary | ICD-10-CM | POA: Diagnosis present

## 2022-08-01 DIAGNOSIS — Z743 Need for continuous supervision: Secondary | ICD-10-CM | POA: Diagnosis not present

## 2022-08-01 DIAGNOSIS — L89151 Pressure ulcer of sacral region, stage 1: Secondary | ICD-10-CM | POA: Diagnosis present

## 2022-08-01 DIAGNOSIS — R000 Tachycardia, unspecified: Secondary | ICD-10-CM | POA: Diagnosis not present

## 2022-08-01 DIAGNOSIS — Z8261 Family history of arthritis: Secondary | ICD-10-CM

## 2022-08-01 DIAGNOSIS — E119 Type 2 diabetes mellitus without complications: Secondary | ICD-10-CM | POA: Diagnosis not present

## 2022-08-01 DIAGNOSIS — Z882 Allergy status to sulfonamides status: Secondary | ICD-10-CM

## 2022-08-01 DIAGNOSIS — K760 Fatty (change of) liver, not elsewhere classified: Secondary | ICD-10-CM | POA: Diagnosis not present

## 2022-08-01 DIAGNOSIS — B961 Klebsiella pneumoniae [K. pneumoniae] as the cause of diseases classified elsewhere: Secondary | ICD-10-CM | POA: Diagnosis present

## 2022-08-01 DIAGNOSIS — Z7401 Bed confinement status: Secondary | ICD-10-CM | POA: Diagnosis not present

## 2022-08-01 DIAGNOSIS — E1100 Type 2 diabetes mellitus with hyperosmolarity without nonketotic hyperglycemic-hyperosmolar coma (NKHHC): Secondary | ICD-10-CM

## 2022-08-01 DIAGNOSIS — E1165 Type 2 diabetes mellitus with hyperglycemia: Secondary | ICD-10-CM | POA: Diagnosis present

## 2022-08-01 DIAGNOSIS — M961 Postlaminectomy syndrome, not elsewhere classified: Secondary | ICD-10-CM | POA: Diagnosis present

## 2022-08-01 DIAGNOSIS — Z8249 Family history of ischemic heart disease and other diseases of the circulatory system: Secondary | ICD-10-CM

## 2022-08-01 DIAGNOSIS — Z0389 Encounter for observation for other suspected diseases and conditions ruled out: Secondary | ICD-10-CM | POA: Diagnosis not present

## 2022-08-01 DIAGNOSIS — R0902 Hypoxemia: Secondary | ICD-10-CM | POA: Diagnosis not present

## 2022-08-01 DIAGNOSIS — K5730 Diverticulosis of large intestine without perforation or abscess without bleeding: Secondary | ICD-10-CM | POA: Diagnosis not present

## 2022-08-01 LAB — CBC
HCT: 30 % — ABNORMAL LOW (ref 39.0–52.0)
Hemoglobin: 9.4 g/dL — ABNORMAL LOW (ref 13.0–17.0)
MCH: 29.4 pg (ref 26.0–34.0)
MCHC: 31.3 g/dL (ref 30.0–36.0)
MCV: 93.8 fL (ref 80.0–100.0)
Platelets: 326 10*3/uL (ref 150–400)
RBC: 3.2 MIL/uL — ABNORMAL LOW (ref 4.22–5.81)
RDW: 14.9 % (ref 11.5–15.5)
WBC: 21.7 10*3/uL — ABNORMAL HIGH (ref 4.0–10.5)
nRBC: 0 % (ref 0.0–0.2)

## 2022-08-01 LAB — COMPREHENSIVE METABOLIC PANEL
ALT: 20 U/L (ref 0–44)
AST: 22 U/L (ref 15–41)
Albumin: 2.9 g/dL — ABNORMAL LOW (ref 3.5–5.0)
Alkaline Phosphatase: 62 U/L (ref 38–126)
Anion gap: 10 (ref 5–15)
BUN: 20 mg/dL (ref 8–23)
CO2: 20 mmol/L — ABNORMAL LOW (ref 22–32)
Calcium: 8.2 mg/dL — ABNORMAL LOW (ref 8.9–10.3)
Chloride: 102 mmol/L (ref 98–111)
Creatinine, Ser: 1.29 mg/dL — ABNORMAL HIGH (ref 0.61–1.24)
GFR, Estimated: 58 mL/min — ABNORMAL LOW (ref >60)
Glucose, Bld: 159 mg/dL — ABNORMAL HIGH (ref 70–99)
Potassium: 4.6 mmol/L (ref 3.5–5.1)
Sodium: 132 mmol/L — ABNORMAL LOW (ref 135–145)
Total Bilirubin: 1 mg/dL (ref 0.3–1.2)
Total Protein: 6.3 g/dL — ABNORMAL LOW (ref 6.5–8.1)

## 2022-08-01 LAB — URINALYSIS, ROUTINE W REFLEX MICROSCOPIC
Bilirubin Urine: NEGATIVE
Glucose, UA: NEGATIVE mg/dL
Ketones, ur: NEGATIVE mg/dL
Nitrite: NEGATIVE
Protein, ur: 30 mg/dL — AB
Specific Gravity, Urine: 1.016 (ref 1.005–1.030)
WBC, UA: >50 WBC/hpf (ref 0–5)
pH: 5 (ref 5.0–8.0)

## 2022-08-01 LAB — LACTIC ACID, PLASMA
Lactic Acid, Venous: 2.1 mmol/L (ref 0.5–1.9)
Lactic Acid, Venous: 2.2 mmol/L (ref 0.5–1.9)

## 2022-08-01 LAB — PROTIME-INR
INR: 1.2 (ref 0.8–1.2)
Prothrombin Time: 15.1 seconds (ref 11.4–15.2)

## 2022-08-01 LAB — SARS CORONAVIRUS 2 BY RT PCR: SARS Coronavirus 2 by RT PCR: NEGATIVE

## 2022-08-01 MED ORDER — ONDANSETRON HCL 4 MG/2ML IJ SOLN: 4.0000 mg | Freq: Four times a day (QID) | INTRAMUSCULAR | Status: DC | PRN

## 2022-08-01 MED ORDER — LACTATED RINGERS IV BOLUS (SEPSIS)
1000.0000 mL | Freq: Once | INTRAVENOUS | Status: AC
  Administered 2022-08-01: 1000 mL via INTRAVENOUS

## 2022-08-01 MED ORDER — ATORVASTATIN CALCIUM 80 MG PO TABS
80.0000 mg | ORAL_TABLET | Freq: Every day | ORAL | Status: DC
  Administered 2022-08-02 – 2022-08-07 (×6): 80 mg via ORAL
  Filled 2022-08-01 (×6): qty 1

## 2022-08-01 MED ORDER — NOREPINEPHRINE 4 MG/250ML-% IV SOLN
2.0000 ug/min | INTRAVENOUS | Status: DC
  Administered 2022-08-01: 6 ug/min via INTRAVENOUS
  Administered 2022-08-02: 7 ug/min via INTRAVENOUS
  Administered 2022-08-02: 4 ug/min via INTRAVENOUS
  Filled 2022-08-01: qty 250

## 2022-08-01 MED ORDER — SODIUM CHLORIDE 0.9 % IV SOLN
250.0000 mL | INTRAVENOUS | Status: DC
  Administered 2022-08-02: 250 mL via INTRAVENOUS

## 2022-08-01 MED ORDER — SODIUM CHLORIDE 0.9 % IV SOLN
2.0000 g | Freq: Two times a day (BID) | INTRAVENOUS | Status: DC
  Administered 2022-08-01 – 2022-08-02 (×2): 2 g via INTRAVENOUS
  Filled 2022-08-01 (×2): qty 12.5

## 2022-08-01 MED ORDER — DOCUSATE SODIUM 100 MG PO CAPS
100.0000 mg | ORAL_CAPSULE | Freq: Two times a day (BID) | ORAL | Status: DC | PRN
  Administered 2022-08-03: 100 mg via ORAL
  Filled 2022-08-01: qty 1

## 2022-08-01 MED ORDER — ASPIRIN 81 MG PO TBEC
81.0000 mg | DELAYED_RELEASE_TABLET | Freq: Every day | ORAL | Status: DC
  Administered 2022-08-02 – 2022-08-07 (×6): 81 mg via ORAL
  Filled 2022-08-01 (×6): qty 1

## 2022-08-01 MED ORDER — POLYETHYLENE GLYCOL 3350 17 G PO PACK: 17.0000 g | PACK | Freq: Every day | ORAL | Status: DC | PRN

## 2022-08-01 MED ORDER — IOHEXOL 350 MG/ML SOLN
75.0000 mL | Freq: Once | INTRAVENOUS | Status: AC | PRN
  Administered 2022-08-01: 75 mL via INTRAVENOUS

## 2022-08-01 MED ORDER — LACTATED RINGERS IV SOLN: INTRAVENOUS | Status: AC

## 2022-08-01 MED ORDER — METRONIDAZOLE 500 MG/100ML IV SOLN
500.0000 mg | Freq: Once | INTRAVENOUS | Status: AC
  Administered 2022-08-01: 500 mg via INTRAVENOUS
  Filled 2022-08-01: qty 100

## 2022-08-01 MED ORDER — SERTRALINE HCL 100 MG PO TABS
100.0000 mg | ORAL_TABLET | Freq: Every day | ORAL | Status: DC
  Administered 2022-08-02 – 2022-08-06 (×6): 100 mg via ORAL
  Filled 2022-08-01: qty 2
  Filled 2022-08-01: qty 1
  Filled 2022-08-01 (×3): qty 2
  Filled 2022-08-01: qty 1

## 2022-08-01 MED ORDER — LACTATED RINGERS IV BOLUS
1000.0000 mL | Freq: Once | INTRAVENOUS | Status: AC
  Administered 2022-08-01: 1000 mL via INTRAVENOUS

## 2022-08-01 MED ORDER — HEPARIN SODIUM (PORCINE) 5000 UNIT/ML IJ SOLN
5000.0000 [IU] | Freq: Three times a day (TID) | INTRAMUSCULAR | Status: DC
  Administered 2022-08-01 – 2022-08-07 (×18): 5000 [IU] via SUBCUTANEOUS
  Filled 2022-08-01 (×18): qty 1

## 2022-08-01 MED ORDER — ACETAMINOPHEN 500 MG PO TABS
1000.0000 mg | ORAL_TABLET | Freq: Once | ORAL | Status: AC
  Administered 2022-08-01: 1000 mg via ORAL
  Filled 2022-08-01: qty 2

## 2022-08-01 MED ORDER — INSULIN ASPART 100 UNIT/ML IJ SOLN
0.0000 [IU] | INTRAMUSCULAR | Status: DC
  Administered 2022-08-02: 2 [IU] via SUBCUTANEOUS
  Administered 2022-08-02 (×5): 1 [IU] via SUBCUTANEOUS
  Administered 2022-08-03 – 2022-08-04 (×6): 2 [IU] via SUBCUTANEOUS
  Administered 2022-08-04: 1 [IU] via SUBCUTANEOUS
  Administered 2022-08-04: 2 [IU] via SUBCUTANEOUS
  Administered 2022-08-04: 3 [IU] via SUBCUTANEOUS
  Administered 2022-08-04: 2 [IU] via SUBCUTANEOUS
  Administered 2022-08-05: 1 [IU] via SUBCUTANEOUS
  Administered 2022-08-07 (×2): 2 [IU] via SUBCUTANEOUS

## 2022-08-01 MED ORDER — ACETAMINOPHEN 325 MG PO TABS
650.0000 mg | ORAL_TABLET | ORAL | Status: DC | PRN
  Administered 2022-08-04 – 2022-08-07 (×5): 650 mg via ORAL
  Filled 2022-08-01 (×5): qty 2

## 2022-08-01 MED ORDER — CLOPIDOGREL BISULFATE 75 MG PO TABS
75.0000 mg | ORAL_TABLET | Freq: Every day | ORAL | Status: DC
  Administered 2022-08-02 – 2022-08-07 (×6): 75 mg via ORAL
  Filled 2022-08-01 (×6): qty 1

## 2022-08-01 MED ORDER — SODIUM CHLORIDE 0.9 % IV SOLN: 2.0000 g | Freq: Once | INTRAVENOUS | Status: DC

## 2022-08-01 MED ORDER — LACTATED RINGERS IV BOLUS (SEPSIS)
500.0000 mL | Freq: Once | INTRAVENOUS | Status: AC
  Administered 2022-08-01: 500 mL via INTRAVENOUS

## 2022-08-01 MED ORDER — LEVOTHYROXINE SODIUM 50 MCG PO TABS
50.0000 ug | ORAL_TABLET | Freq: Every day | ORAL | Status: DC
  Administered 2022-08-02 – 2022-08-07 (×6): 50 ug via ORAL
  Filled 2022-08-01 (×6): qty 1

## 2022-08-01 MED ORDER — NOREPINEPHRINE 4 MG/250ML-% IV SOLN
0.0000 ug/min | INTRAVENOUS | Status: DC
  Administered 2022-08-01: 5 ug/min via INTRAVENOUS
  Filled 2022-08-01: qty 250

## 2022-08-01 NOTE — ED Notes (Signed)
Pt brief changed at this time 

## 2022-08-01 NOTE — H&P (Cosign Needed Addendum)
NAME:  [name], MRN:  [id number], DOB:  1945-04-19, LOS: 0 ADMISSION DATE:  08/01/2022, CONSULTATION DATE:  4/19 REFERRING MD:  Dr. Elpidio Anis, CHIEF COMPLAINT:  urosepsis   History of Present Illness:  Patient is a 77 year old male with pertinent PMH HTN, HLD, CAD, paroxysmal atrial tachycardia, hypothyroidism, T2DM, depression/anxiety presents to [facility] ED on 4/19 with urosepsis.  Patient recently admitted on 05/2022 with right upper extremity weakness and recurrent falls.  CT head negative for acute abnormality.  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  NSG consulted but do not believe this is the cause of his right upper extremity weakness.  MRI of shoulder showing multiple rotator cuff tears and bony Bankart fracture.  Ortho consulted recommended conservative management.  Patient discharged on 2/27.  Patient has since been working with physical therapy outpatient.  On 4/19 patient had finished working with PT and states he passed a kidney stone. Later today he slipped out of chair and landed on his backside.  No loss of consciousness or head trauma.  States he has been having generalized weakness and vomited once this morning.  Came to [facility] ED for further eval.  Upon arrival to [facility] ED on 4/19, patient stating he is having some back pain.  Initially patient's BP stable.  Tachycardia 110s and RR 22.  Afebrile.  On room air with sats 99%.  UA with large leukocytes, LA 2.1, and WBC 21.7.  Sepsis protocol initiated.  Patient given IV fluids and cultures obtained.  Patient started on cefepime.  CXR with no significant findings. CT abd/pelvis: Hepatic steatosis; bilateral renal calculi 5 mm renal calculus is seen within the dependent portion of the left renal pelvis. This is located within the mid left kidney on the prior study. A 6 mm renal calculus noted within the dependent portion of a mildly dilated right renal pelvis. This is unchanged in position when compared to the prior  exam. Patient's BP continued to drop despite IV fluids.  Started on low-dose levo.  PCCM consulted for ICU admission.  Pertinent ED labs: NA 132, creat 1.29, Hgb 9.4, COVID/flu negative  Pertinent  Medical History   Past Medical History:  Diagnosis Date   Abnormal stress test    Anxiety    Arthritis    Atherosclerotic heart disease of native coronary artery with angina pectoris 03/14/2013   Atypical chest pain 12/28/2013   CAD in native artery 03/14/2013   Cervical disc disorder at C5-C6 level with radiculopathy 06/09/2022   Cervical radiculopathy at C5 06/10/2022   Chest pain 07/30/2011   1999 Cath - <50% stenosis 2008 NUC - low risk    Chronic back pain    herniated disc   Coronary artery disease    takes Plavix and ASA daily   Depression    takes Prozac daily   Diabetes mellitus type 2, controlled, with complications 07/30/2011   Displacement of lumbar intervertebral disc without myelopathy 05/30/2014   Diverticulitis    hx of   Dizziness    occasionally and states Dr.Smith is aware   DJD (degenerative joint disease)    Essential hypertension, benign 07/30/2011   Fatigue 09/06/2019   GERD (gastroesophageal reflux disease)    takes an OTC antacid   Gout    yrs ago and doesn't take any meds   Hard of hearing    wears hearing aids   Headache(784.0)    occasionally   History of colon polyps    benign  History of fusion of cervical spine 09/04/2020   Last Assessment & Plan:   Formatting of this note might be different from the original.  Review of recent cervical spine CT on care everywhere notes ACDF from C5-7 without hardware complication.  At this time he is doing well without any symptomatic issues.   History of kidney stones    HNP (herniated nucleus pulposus), lumbar 06/14/2014   Hyperlipidemia    takes Atorvastatin daily   Hypertension    takes Amlodipine,Losartan,and Coreg daily   Hypothyroidism    takes Synthroid daily   IBS (irritable bowel syndrome)     Lumbar post-laminectomy syndrome 09/04/2020   Last Assessment & Plan:   Formatting of this note might be different from the original.  77 year old male with chronic low back pain history of multiple lumbar spine surgeries, he has had a multilevel decompression and is fused from L4 to the sacrum.  His lumbar MRI completed in 2017 shows improvement compared with prior status post left L3-4 diskectomy in March of 2016 there is mild residual mass   Nontraumatic complete tear of right rotator cuff 06/09/2022   Obesity, unspecified 07/30/2011   Other spondylosis, lumbar region 02/12/2021   Pain medication agreement 09/04/2020   Last Assessment & Plan:   Formatting of this note might be different from the original.  Agreement up-to-date.  Patient and I have discussed the hazardous effects of continued opiate pain medication usage. Risks and benefits of above medications including but not limited to possibility of respiratory depression, sedation, and even death were discussed with the patient who expressed an understandin   PAT (paroxysmal atrial tachycardia) 10/12/2020   Preoperative cardiovascular examination 09/06/2019   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   Right sided weakness 06/04/2022   Shortness of breath dyspnea    with exertion   Swelling of lower limb 06/20/2014   Tuberculosis    Patient denies Tb   Type II diabetes mellitus    takes Metformin daily   Unstable angina 12/27/2013   Weakness    numbness and tingling in left leg     Significant Hospital Events: Including procedures, antibiotic start and stop dates in addition to other pertinent events   4/19 admitted to [facility] with generalized weakness; urosepsis and started on levo  Interim History / Subjective:  See above  Objective   Blood pressure (!) 87/43, pulse 94, temperature 98.1 F (36.7 C), resp. rate 20, height 5\' 9"  (1.753 m), weight 104.3 kg, SpO2 100 %.        Intake/Output Summary (Last 24 hours)  at 08/01/2022 2136 Last data filed at 08/01/2022 2118 Gross per 24 hour  Intake 3665.05 ml  Output --  Net 3665.05 ml   Filed Weights   08/01/22 1539  Weight: 104.3 kg    Examination: General:  NAD HEENT: MM pink/dry Neuro: Aox3; MAE; sedated; CV: s1s2, tachy 110s, no m/r/g; mild right sided cva tenderness PULM:  dim clear BS bilaterally; RA GI: soft, bsx4 active  Extremities: warm/dry, no edema; right sided bruising Skin: no rashes or lesions    Resolved Hospital Problem list     Assessment & Plan:   Septic shock UTI B/l renal calculi: Hx of kidney stones; CT abd/pelvis A 5 mm renal calculus is seen within the dependent portion of the left renal pelvis. This is located within the mid left kidney on the prior study. A 6 mm renal calculus noted within the dependent portion of a mildly dilated right renal pelvis.  Unchanged from previous CT in 11/2016. Plan: -Admit to ICU with continuous telemetry -Consider urology consult in am -Continue levo for MAP goal greater than 65 -Continue IV fluids -Trend LA -check PCT and cortisol -Follow-up cultures -Continue cefepime -Trend WBC/fever curve  AKI Hyponatremia: Likely hypovolemia Plan: -Continue IV fluids -Trend BMP / urinary output -Replace electrolytes as indicated -Avoid nephrotoxic agents, ensure adequate renal perfusion  HTN HLD CAD paroxysmal atrial tachycardia Plan: -Hold home antihypertensives -Resume ASA, Plavix, statin  Hepatic steatosis Plan: -cont statin  Multiple rotator cuff tears on right Chronic back pain Lumbar postlaminectomy syndrome C-spine radiculopathy Plan: -Continue supportive care -PT/OT -states back pain is improved; consider resuming home robaxin and tramadol in am -F/U Ortho outpatient  Anemia Plan: -Trend CBC  Hypothyroidism Plan: -Continue Synthroid  T2DM Plan: -SSI and CBG monitoring  depression/anxiety Plan: -Continue sertraline  Best Practice (right click and  "Reselect all SmartList Selections" daily)   Diet/type: clear liquids DVT prophylaxis: prophylactic heparin  GI prophylaxis: N/A Lines: N/A Foley:  N/A Code Status:  full code Last date of multidisciplinary goals of care discussion [4/19 updated patient at bedside]  Labs   CBC: Recent Labs  Lab 08/01/22 1558  WBC 21.7*  HGB 9.4*  HCT 30.0*  MCV 93.8  PLT 326    Basic Metabolic Panel: Recent Labs  Lab 08/01/22 1558  NA 132*  K 4.6  CL 102  CO2 20*  GLUCOSE 159*  BUN 20  CREATININE 1.29*  CALCIUM 8.2*   GFR: Estimated Creatinine Clearance: 59.8 mL/min (A) (by C-G formula based on SCr of 1.29 mg/dL (H)). Recent Labs  Lab 08/01/22 1558 08/01/22 1815  WBC 21.7*  --   LATICACIDVEN 2.1* 2.2*    Liver Function Tests: Recent Labs  Lab 08/01/22 1558  AST 22  ALT 20  ALKPHOS 62  BILITOT 1.0  PROT 6.3*  ALBUMIN 2.9*   No results for input(s): "LIPASE", "AMYLASE" in the last 168 hours. No results for input(s): "AMMONIA" in the last 168 hours.  ABG    Component Value Date/Time   TCO2 21 (L) 06/04/2022 0800     Coagulation Profile: Recent Labs  Lab 08/01/22 1630  INR 1.2    Cardiac Enzymes: No results for input(s): "CKTOTAL", "CKMB", "CKMBINDEX", "TROPONINI" in the last 168 hours.  HbA1C: Hgb A1c MFr Bld  Date/Time Value Ref Range Status  06/05/2022 05:10 AM 8.1 (H) 4.8 - 5.6 % Final    Comment:    (NOTE) Pre diabetes:          5.7%-6.4%  Diabetes:              >6.4%  Glycemic control for   <7.0% adults with diabetes   11/08/2015 08:36 AM 6.8 (H) 4.8 - 5.6 % Final    Comment:    (NOTE)         Pre-diabetes: 5.7 - 6.4         Diabetes: >6.4         Glycemic control for adults with diabetes: <7.0     CBG: No results for input(s): "GLUCAP" in the last 168 hours.  Review of Systems:   Review of Systems  Constitutional:  Positive for malaise/fatigue. Negative for fever.  Respiratory:  Negative for cough, shortness of breath and  wheezing.   Cardiovascular:  Negative for chest pain.  Gastrointestinal:  Positive for nausea and vomiting. Negative for abdominal pain, constipation and diarrhea.  Genitourinary:  Positive for flank pain. Negative for dysuria, frequency and  urgency.  Skin:  Negative for rash.     Past Medical History:  He,  has a past medical history of Abnormal stress test, Anxiety, Arthritis, 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03/14/2013), Atypical chest pain (12/28/2013), CAD in native artery (03/14/2013), Cervical disc disorder at C5-C6 level with radiculopathy (06/09/2022), Cervical radiculopathy at C5 (06/10/2022), Chest pain (07/30/2011), Chronic back pain, Coronary artery disease, Depression, Diabetes mellitus type 2, controlled, with complications (07/30/2011), Displacement of lumbar intervertebral disc without myelopathy (05/30/2014), Diverticulitis, Dizziness, DJD (degenerative joint disease), Essential hypertension, benign (07/30/2011), Fatigue (09/06/2019), GERD (gastroesophageal reflux disease), Gout, Hard of hearing, Headache(784.0), History of colon polyps, History of fusion of cervical spine (09/04/2020), History of kidney stones, HNP (herniated nucleus pulposus), lumbar (06/14/2014), Hyperlipidemia, Hypertension, Hypothyroidism, IBS (irritable bowel syndrome), Lumbar post-laminectomy syndrome (09/04/2020), Nontraumatic complete tear of right rotator cuff (06/09/2022), Obesity, unspecified (07/30/2011), Other spondylosis, lumbar region (02/12/2021), Pain medication agreement (09/04/2020), PAT (paroxysmal atrial tachycardia) (10/12/2020), Preoperative cardiovascular examination (09/06/2019), Profound sensorineural hearing loss (SNHL) (04/23/2021), Right sided weakness (06/04/2022), Shortness of breath dyspnea, Swelling of lower limb (06/20/2014), Tuberculosis, Type II diabetes mellitus, Unstable angina (12/27/2013), and Weakness.   Surgical History:   Past  Surgical History:  Procedure Laterality Date   ANTERIOR CERVICAL DECOMP/DISCECTOMY FUSION  04/2007   C4-5; C6-7   BACK SURGERY     "3 times; last time was screws in lower back 10/2008"   CHOLECYSTECTOMY  1970's   COLONOSCOPY     CORONARY ANGIOPLASTY  1999/2013   1 stent   CYSTOSCOPY     LEFT HEART CATH AND CORONARY ANGIOGRAPHY N/A 09/30/2019   Procedure: LEFT HEART CATH AND CORONARY ANGIOGRAPHY;  Surgeon: Corky Crafts, MD;  Location: [facility] INVASIVE CV LAB;  Service: Cardiovascular;  Laterality: N/A;   LEFT HEART CATHETERIZATION WITH CORONARY ANGIOGRAM N/A 08/21/2011   Procedure: LEFT HEART CATHETERIZATION WITH CORONARY ANGIOGRAM;  Surgeon: Lesleigh Noe, MD;  Location: [facility] CATH LAB;  Service: Cardiovascular;  Laterality: N/A;   LITHOTRIPSY     LUMBAR LAMINECTOMY/DECOMPRESSION MICRODISCECTOMY Left 06/14/2014   Procedure: LEFT LUMBAR THREE-FOUR LANINOTOMY AND MICRODISKECTOMY.;  Surgeon: Hewitt Shorts, MD;  Location: [facility] NEURO ORS;  Service: Neurosurgery;  Laterality: Left;  left   PCI  5/09/2-13   LAD   RADIOLOGY WITH ANESTHESIA N/A 11/13/2015   Procedure: MRI OF LUMBAR SPINE W/WO CONTRAST    (RADIOLOGY WITH ANESTHESIA);  Surgeon: Medication Radiologist, MD;  Location: [facility] OR;  Service: Radiology;  Laterality: N/A;   RADIOLOGY WITH ANESTHESIA N/A 06/05/2022   Procedure: MRI WITH ANESTHESIA;  Surgeon: Radiologist, Medication, MD;  Location: [facility] OR;  Service: Radiology;  Laterality: N/A;   RADIOLOGY WITH ANESTHESIA N/A 06/08/2022   Procedure: MRI WITH ANESTHESIA;  Surgeon: Radiologist, Medication, MD;  Location: [facility] OR;  Service: Radiology;  Laterality: N/A;   SHOULDER ARTHROSCOPY W/ ROTATOR CUFF REPAIR  ~ 2011   left     Social History:   reports that he has quit smoking. His smokeless tobacco use includes chew. He reports that he does not drink alcohol and does not use drugs.   Family History:  His family history includes Arthritis in his mother; Cancer in his father; Healthy in his  sister and sister; Heart attack in his brother; Heart disease in his brother; Hypertension in his brother.   Allergies Allergies  Allergen Reactions   Misc. Sulfonamide Containing Compounds Rash   Sulfa Antibiotics Rash     Home Medications  Prior to Admission medications  Medication Sig Start Date End Date Taking? Authorizing Provider  acetaminophen (TYLENOL) 500 MG tablet Take 1,000 mg by mouth every 6 (six) hours as needed for mild pain.     [provider]  amLODipine (NORVASC) 10 MG tablet Take 10 mg by mouth daily.    [provider]  aspirin 81 MG tablet Take 81 mg by mouth daily.    [provider]  atorvastatin (LIPITOR) 80 MG tablet Take 80 mg by mouth daily. 11/01/21   [provider]  carvedilol (COREG) 3.125 MG tablet Take 1 tablet (3.125 mg total) by mouth 2 (two) times daily with a meal. 06/10/22   Lyndle Herrlich, MD  clopidogrel (PLAVIX) 75 MG tablet Take 75 mg by mouth daily with breakfast.    [provider]  diclofenac Sodium (VOLTAREN) 1 % GEL Apply 4 g topically 4 (four) times daily. 06/10/22   Lyndle Herrlich, MD  insulin aspart (NOVOLOG) 100 UNIT/ML injection Inject 0-20 Units into the skin 3 (three) times daily with meals. 06/10/22   Lyndle Herrlich, MD  levothyroxine (SYNTHROID, LEVOTHROID) 50 MCG tablet Take 50 mcg by mouth daily before breakfast. 02/26/15   [provider]  lidocaine (LIDODERM) 5 % Place 1 patch onto the skin daily. Remove & Discard patch within 12 hours or as directed by MD 06/10/22   Lyndle Herrlich, MD  losartan (COZAAR) 100 MG tablet Take 100 mg by mouth daily.    [provider]  melatonin 3 MG TABS tablet Take 1 tablet (3 mg total) by mouth at bedtime. 06/10/22   Lyndle Herrlich, MD  metFORMIN (GLUCOPHAGE-XR) 500 MG 24 hr tablet Take 1 tablet (500 mg total) by mouth 2 (two) times daily. 10/02/19   Corky Crafts, MD  methocarbamol (ROBAXIN) 750  MG tablet Take 1 tablet (750 mg total) by mouth 3 (three) times daily. 06/10/22   Lyndle Herrlich, MD  nitroGLYCERIN (NITROSTAT) 0.4 MG SL tablet Place 0.4 mg under the tongue every 5 (five) minutes as needed for chest pain.    [provider]  polyethylene glycol powder (GLYCOLAX/MIRALAX) 17 GM/SCOOP powder Take 17 g by mouth in the morning.    [provider]  sertraline (ZOLOFT) 100 MG tablet Take 1 tablet (100 mg total) by mouth at bedtime. 06/10/22   Lyndle Herrlich, MD  traMADol (ULTRAM) 50 MG tablet Take 1 tablet (50 mg total) by mouth every 8 (eight) hours as needed for moderate pain or severe pain. 06/10/22   Lyndle Herrlich, MD     Critical care time: 45 minutes     JD Daryel [facility] 08/01/2022, 9:36 PM  Please see [email or web address] for pager details.  From 7A-7P if no response, please call [phone]. After hours, please call ELink [phone].

## 2022-08-01 NOTE — ED Notes (Signed)
Sister Kingsley Plan [phone] would like an update asap and to talk to her brother asap

## 2022-08-01 NOTE — ED Provider Notes (Signed)
[facility] AT [facility] Provider Note   CSN: [id number] Arrival date & time: 08/01/22  1536     History  Chief Complaint  Patient presents with   Weakness    generalized    [name] is a 77 y.o. male with history of Harris, [name], [name], [name], [name], [name], [name], [name], [name], [name], [name], paroxysmal atrial tachycardia who presents to the ER  complaining of weakness and fall earlier this AM.  Patient states that he had finished with physical therapy and was sitting in his recliner chair.  Has been in physical therapy since fall almost 2 months ago in which she was admitted to the hospital for almost a week.  He slipped out of the chair and fell hitting the ground with his backside.  There was no head trauma or loss of consciousness.  He states that EMS came out to his house but he declined transport at that time.  He then fell again in the same way.  Complaining of generalized weakness.  States he vomited once this morning.  Complaining of Harris in his lower back/bottom.  No diarrhea or urinary symptoms. On plavix daily.    Weakness Associated symptoms: nausea and vomiting   Associated symptoms: no chest Harris, no cough, no fever and no shortness of breath        Home Medications Prior to Admission medications   Medication Sig Start Date End Date Taking? Authorizing Provider  acetaminophen (TYLENOL) 500 MG tablet Take 1,000 mg by mouth every 6 (six) hours as needed for mild Harris.     [provider]  amLODipine (NORVASC) 10 MG tablet Take 10 mg by mouth daily.    [provider]  aspirin 81 MG tablet Take 81 mg by mouth daily.    [provider]  atorvastatin (LIPITOR) 80 MG tablet Take 80 mg by mouth daily. 11/01/21   [provider]  carvedilol (COREG) 3.125 MG tablet Take 1 tablet (3.125 mg total) by mouth 2 (two) times daily with a meal. 06/10/22   Lyndle Herrlich, MD  clopidogrel  (PLAVIX) 75 MG tablet Take 75 mg by mouth daily with breakfast.    [provider]  diclofenac Sodium (VOLTAREN) 1 % GEL Apply 4 g topically 4 (four) times daily. 06/10/22   Lyndle Herrlich, MD  insulin aspart (NOVOLOG) 100 UNIT/ML injection Inject 0-20 Units into the skin 3 (three) times daily with meals. 06/10/22   Lyndle Herrlich, MD  levothyroxine (SYNTHROID, LEVOTHROID) 50 MCG tablet Take 50 mcg by mouth daily before breakfast. 02/26/15   [provider]  lidocaine (LIDODERM) 5 % Place 1 patch onto the skin daily. Remove & Discard patch within 12 hours or as directed by MD 06/10/22   Lyndle Herrlich, MD  losartan (COZAAR) 100 MG tablet Take 100 mg by mouth daily.    [provider]  melatonin 3 MG TABS tablet Take 1 tablet (3 mg total) by mouth at bedtime. 06/10/22   Lyndle Herrlich, MD  metFORMIN (GLUCOPHAGE-XR) 500 MG 24 hr tablet Take 1 tablet (500 mg total) by mouth 2 (two) times daily. 10/02/19   Corky Crafts, MD  methocarbamol (ROBAXIN) 750 MG tablet Take 1 tablet (750 mg total) by mouth 3 (three) times daily. 06/10/22   Lyndle Herrlich, MD  nitroGLYCERIN (NITROSTAT) 0.4 MG SL tablet Place 0.4 mg under the tongue every 5 (five) minutes as needed for chest Harris.    [provider]  polyethylene  glycol powder (GLYCOLAX/MIRALAX) 17 GM/SCOOP powder Take 17 g by mouth in the morning.    [provider]  sertraline (ZOLOFT) 100 MG tablet Take 1 tablet (100 mg total) by mouth at bedtime. 06/10/22   Lyndle Herrlich, MD  traMADol (ULTRAM) 50 MG tablet Take 1 tablet (50 mg total) by mouth every 8 (eight) hours as needed for moderate Harris or severe Harris. 06/10/22   Lyndle Herrlich, MD      Allergies    Misc. sulfonamide containing compounds and Sulfa antibiotics    Review of Systems   Review of Systems  Constitutional:  Negative for fever.  Respiratory:  Negative for cough and shortness of breath.    Cardiovascular:  Negative for chest Harris.  Gastrointestinal:  Positive for nausea and vomiting.  Musculoskeletal:  Positive for back Harris.  Skin:  Negative for wound.  Neurological:  Positive for weakness.  All other systems reviewed and are negative.   Physical Exam Updated Vital Signs BP (!) 134/55   Pulse 90   Temp 98.1 F (36.7 C)   Resp (!) 25   Ht  (1.753 m)   Wt 104.3 kg   SpO2 100%   BMI 33.97 kg/m  Physical Exam Vitals and nursing note reviewed.  Constitutional:      Appearance: Normal appearance. He is obese.  HENT:     Head: Normocephalic and atraumatic.  Eyes:     Conjunctiva/sclera: Conjunctivae normal.  Cardiovascular:     Rate and Rhythm: Regular rhythm. Tachycardia present.  Pulmonary:     Effort: Pulmonary effort is normal. No respiratory distress.     Breath sounds: Normal breath sounds.  Abdominal:     General: There is no distension.     Palpations: Abdomen is soft.     Tenderness: There is no abdominal tenderness.  Musculoskeletal:     Right lower leg: No edema.     Left lower leg: No edema.  Skin:    General: Skin is warm and moist.     Coloration: Skin is pale.  Neurological:     General: No focal deficit present.     Mental Status: He is alert.     ED Results / Procedures / Treatments   Labs (all labs ordered are listed, but only abnormal results are displayed) Labs Reviewed  COMPREHENSIVE METABOLIC PANEL - Abnormal; Notable for the following components:      Result Value   Sodium 132 (*)    CO2 20 (*)    Glucose, Bld 159 (*)    Creatinine, Ser 1.29 (*)    Calcium 8.2 (*)    Total Protein 6.3 (*)    Albumin 2.9 (*)    GFR, Estimated 58 (*)    All other components within normal limits  CBC - Abnormal; Notable for the following components:   WBC 21.7 (*)    RBC 3.20 (*)    Hemoglobin 9.4 (*)    HCT 30.0 (*)    All other components within normal limits  URINALYSIS, ROUTINE W REFLEX MICROSCOPIC - Abnormal; Notable for  the following components:   APPearance CLOUDY (*)    Hgb urine dipstick MODERATE (*)    Protein, ur 30 (*)    Leukocytes,Ua LARGE (*)    Bacteria, UA RARE (*)    All other components within normal limits  LACTIC ACID, PLASMA - Abnormal; Notable for the following components:   Lactic Acid, Venous 2.1 (*)    All other components within normal limits  LACTIC ACID, PLASMA - Abnormal; Notable for the following components:   Lactic Acid, Venous 2.2 (*)    All other components within normal limits  SARS CORONAVIRUS 2 BY RT PCR  CULTURE, BLOOD (ROUTINE X 2)  CULTURE, BLOOD (ROUTINE X 2)  URINE CULTURE  PROTIME-INR  CBG MONITORING, ED    EKG EKG Interpretation  Date/Time:  Friday August 01 2022 16:04:39 EDT Ventricular Rate:  115 PR Interval:  161 QRS Duration: 86 QT Interval:  307 QTC Calculation: 425 R Axis:   -5 Text Interpretation: Sinus tachycardia Borderline low voltage, extremity leads HEART RATE INCREASED SINCE Confirmed by Vivien Rossetti ([id number]) on 08/01/2022 4:07:29 PM  Radiology CT ABDOMEN PELVIS W CONTRAST  Result Date: 08/01/2022 CLINICAL DATA:  Vomiting. EXAM: CT ABDOMEN AND PELVIS WITH CONTRAST TECHNIQUE: Multidetector CT imaging of the abdomen and pelvis was performed using the standard protocol following bolus administration of intravenous contrast. RADIATION DOSE REDUCTION: This exam was performed according to the departmental dose-optimization program which includes automated exposure control, adjustment of the mA and/or kV according to patient size and/or use of iterative reconstruction technique. CONTRAST:  75mL OMNIPAQUE IOHEXOL 350 MG/ML SOLN COMPARISON:  July 04, 2022 FINDINGS: Lower chest: No acute abnormality. Hepatobiliary: There is diffuse fatty infiltration of the liver parenchyma. No focal liver abnormality is seen. Status post cholecystectomy. No biliary dilatation. Pancreas: Unremarkable. No pancreatic ductal dilatation or surrounding inflammatory changes.  Spleen: Normal in size without focal abnormality. Adrenals/Urinary Tract: The right adrenal gland is unremarkable. The left adrenal gland is nodular in appearance. Kidneys are normal in size. A 4.6 cm diameter simple cyst is seen within the upper pole of the left kidney. A 5 mm renal calculus is seen within the dependent portion of the left renal pelvis. This is located within the mid left kidney on the prior study. A 6 mm renal calculus noted within the dependent portion of a mildly dilated right renal pelvis. This is unchanged in position when compared to the prior exam. Bladder is unremarkable. Stomach/Bowel: Stomach is within normal limits. Appendix appears normal. No evidence of bowel wall thickening, distention, or inflammatory changes. Noninflamed diverticula are seen throughout the sigmoid colon. Vascular/Lymphatic: Aortic atherosclerosis. No enlarged abdominal or pelvic lymph nodes. Reproductive: There is mild to moderate severity prostate gland enlargement. Other: No abdominal wall hernia or abnormality. No abdominopelvic ascites. Musculoskeletal: Postoperative changes are again seen within the mid and lower lumbar spine. Multilevel degenerative changes are also noted. IMPRESSION: 1. Hepatic steatosis. 2. Sigmoid diverticulosis. 3. Bilateral renal calculi, as described above, with interval movement of the left renal calculus from within the left kidney into the left renal pelvis. 4. Evidence of prior cholecystectomy. 5. Aortic atherosclerosis. 6. Enlarged prostate gland. 7. Postoperative changes within the mid and lower lumbar spine. Aortic Atherosclerosis (ICD10-I70.0). Electronically Signed   By: Aram Candela M.D.   On: 08/01/2022 19:51   DG Chest 2 View  Result Date: 08/01/2022 CLINICAL DATA:  Suspected sepsis. EXAM: CHEST - 2 VIEW COMPARISON:  Chest radiograph 06/04/2022 FINDINGS: The cardiomediastinal silhouette is within normal limits. There is an unchanged calcified granuloma in the left  mid lung. No airspace consolidation, edema, pleural effusion, or pneumothorax is identified. Prior cervical spine fusion is noted. IMPRESSION: No active cardiopulmonary disease. Electronically Signed   By: Sebastian Ache M.D.   On: 08/01/2022 16:51    Procedures .Critical Care  Performed by: Su Monks, PA-C Authorized by: Su Monks, PA-C   Critical care provider statement:  Critical care time (minutes):  30   Critical care was necessary to treat or prevent imminent or life-threatening deterioration of the following conditions:  Sepsis   Critical care was time spent personally by me on the following activities:  Development of treatment plan with patient or surrogate, discussions with consultants, evaluation of patient's response to treatment, examination of patient, ordering and review of laboratory studies, ordering and review of radiographic studies, ordering and performing treatments and interventions, pulse oximetry, re-evaluation of patient's condition and review of old charts   Care discussed with: admitting provider       Medications Ordered in ED Medications  lactated ringers infusion ( Intravenous New Bag/Given 08/01/22 1958)  ceFEPIme (MAXIPIME) 2 g in sodium chloride 0.9 % 100 mL IVPB (0 g Intravenous Stopped 08/01/22 1837)  norepinephrine (LEVOPHED) 4mg  in (0.016 mg/mL) premix infusion (5 mcg/min Intravenous New Bag/Given 08/01/22 2140)  lactated ringers bolus 1,000 mL (0 mLs Intravenous Stopped 08/01/22 1750)  acetaminophen (TYLENOL) tablet 1,000 mg (1,000 mg Oral Given 08/01/22 1806)  lactated ringers bolus 1,000 mL (0 mLs Intravenous Stopped 08/01/22 1856)    And  lactated ringers bolus 1,000 mL (0 mLs Intravenous Stopped 08/01/22 1908)    And  lactated ringers bolus 500 mL (0 mLs Intravenous Stopped 08/01/22 2118)  metroNIDAZOLE (FLAGYL) IVPB 500 mg (0 mg Intravenous Stopped 08/01/22 1937)  iohexol (OMNIPAQUE) 350 MG/ML injection 75 mL (75 mLs Intravenous  Contrast Given 08/01/22 1926)    ED Course/ Medical Decision Making/ A&P Clinical Course as of 08/01/22 2207  Fri Aug 01, 2022  1615 WBC(!): 21.7 [LR]  1730 Lactic Acid, Venous(!!): 2.1 [LR]    Clinical Course User Index [LR] Raeshawn Tafolla, Lora Paula, PA-C                             Medical Decision Making Amount and/or Complexity of Data Reviewed Labs: ordered. Decision-making details documented in ED Course. Radiology: ordered.  Risk OTC drugs. Prescription drug management. Decision regarding hospitalization.  This patient is a 77 y.o. male  who presents to the ED for concern of generalized weakness with fall earlier this AM.   Differential diagnoses prior to evaluation: The emergent differential diagnosis includes, but is not limited to, ACS, arrhythmia, syncope, orthostatic hypotension, sepsis, hypoglycemia, hypoxia, electrolyte disturbance, endocrine disorder, anemia, polypharmacy . This is not an exhaustive differential.   Past Medical History / Co-morbidities: Harris, [name], [name], [name], [name], [name], [name], [name], [name], [name], [name], paroxysmal atrial tachycardia  Additional history: Chart reviewed. Pertinent results include: Most recent echocardiogram from August 2023 with LVEF 60 to 65%.  Patient was admitted to the internal medicine teaching service on 2/21 who presented after a fall out of bed with right arm weakness, found to have right rotator cuff tears.  There was concern that this could have been a spine related issue so he was admitted for MRI.  Physical Exam: Physical exam performed. The pertinent findings include: Tachycardic to 115 bpm, soft blood pressures.  Heart regular rate and rhythm, and no increased respiratory effort.  Lung sounds clear.  Abdomen soft and nontender.  No peripheral edema.  No wounds.  Lab Tests/Imaging studies: I personally interpreted labs/imaging and the pertinent results include: Leukocytosis of 21.7, hemoglobin 9.4  compared to 11.8 a month ago.  CMP with kidney injury with creatinine to 1.29, GFR 58.  Urinalysis with moderate hemoglobin, large leukocytes, > 50 WBCs, and WBC clumps.  Initial  lactic acid 2.1, repeat 2.2.  Negative COVID.  Blood and urine cultures pending.  Chest x-ray with no acute cardiopulmonary abnormalities, CT abdomen pelvis with bilateral renal calculi, nonobstructing.  I agree with the radiologist interpretation.  Cardiac monitoring: EKG obtained and interpreted by my attending physician which shows: Sinus tachycardia   Medications: I ordered medication including Tylenol, IV fluids, empiric antibiotics, norepinephrine drip.  I have reviewed the patients home medicines and have made adjustments as needed.  Consultations obtained: Consulted with critical care Dr. Denese Killings who will admit to the ICU   Disposition: After consideration of the diagnostic results and the patients response to treatment, I feel that patient is requiring critical care admission for urosepsis with hypotension.   I discussed this case with my attending physician Dr. Elpidio Anis who cosigned this note including patient's presenting symptoms, physical exam, and planned diagnostics and interventions. Attending physician stated agreement with plan or made changes to plan which were implemented.   Final Clinical Impression(s) / ED Diagnoses Final diagnoses:  Sepsis due to urinary tract infection  Hypotension, unspecified hypotension type    Rx / DC Orders ED Discharge Orders     None      Portions of this report may have been transcribed using voice recognition software. Every effort was made to ensure accuracy; however, inadvertent computerized transcription errors may be present.    Jeanella Flattery 08/01/22 2208    Mardene Sayer, MD 08/02/22 [phone]

## 2022-08-01 NOTE — ED Notes (Signed)
Pt c/o not feeling well in general and generalized weakness. Denies CP or SOB.

## 2022-08-01 NOTE — ED Triage Notes (Signed)
Pt BIB EMS from home. Per EMS, pt had a fall this AM but refused transport to ED. Pt called back out today for generalized weakness. Pt denies injury or hitting head with fall this AM. A/Ox4.

## 2022-08-01 NOTE — Progress Notes (Signed)
Pharmacy Antibiotic Note  [name] is a 77 y.o. male for which pharmacy has been consulted for cefepime dosing for  IAI .  Patient with a history of anxiety, HTN, hypothyroidism, T2DM, depression, CAD, HLD, GERD, IBS, CAD, chronic back pain, paroxysmal atrial tachycardia . Patient presenting with weakness and fall.  SCr 1.29 WBC 21.7; LA 2.1; T 99.7; HR 103; RR 27  Plan: Metronidazole per MD Cefepime 2g q12hr  Trend WBC, Fever, Renal function F/u cultures, clinical course, WBC De-escalate when able  Height:  (175.3 cm) Weight: 104.3 kg (230 lb) IBW/kg (Calculated) : 70.7  Temp (24hrs), Avg:99.2 F (37.3 C), Min:98.6 F (37 C), Max:99.7 F (37.6 C)  Recent Labs  Lab 08/01/22 1558  WBC 21.7*  CREATININE 1.29*  LATICACIDVEN 2.1*    Estimated Creatinine Clearance: 59.8 mL/min (A) (by C-G formula based on SCr of 1.29 mg/dL (H)).    Allergies  Allergen Reactions   Misc. Sulfonamide Containing Compounds Rash   Sulfa Antibiotics Rash   Microbiology results: Pending  Thank you for allowing pharmacy to be a part of this patient's care.  Delmar Landau, PharmD, BCPS 08/01/2022 5:50 PM ED Clinical Pharmacist -  [phone]

## 2022-08-01 NOTE — Progress Notes (Signed)
[facility] will follow per sepsis protocol  

## 2022-08-02 ENCOUNTER — Inpatient Hospital Stay (HOSPITAL_COMMUNITY): Payer: Medicare Other

## 2022-08-02 ENCOUNTER — Encounter (HOSPITAL_COMMUNITY): Payer: Self-pay | Admitting: Pulmonary Disease

## 2022-08-02 DIAGNOSIS — R6521 Severe sepsis with septic shock: Secondary | ICD-10-CM | POA: Diagnosis not present

## 2022-08-02 DIAGNOSIS — A419 Sepsis, unspecified organism: Secondary | ICD-10-CM | POA: Diagnosis not present

## 2022-08-02 LAB — CULTURE, BLOOD (ROUTINE X 2)

## 2022-08-02 LAB — CBC
HCT: 28.4 % — ABNORMAL LOW (ref 39.0–52.0)
Hemoglobin: 8.9 g/dL — ABNORMAL LOW (ref 13.0–17.0)
MCH: 30.3 pg (ref 26.0–34.0)
MCHC: 31.3 g/dL (ref 30.0–36.0)
MCV: 96.6 fL (ref 80.0–100.0)
Platelets: 280 10*3/uL (ref 150–400)
RBC: 2.94 MIL/uL — ABNORMAL LOW (ref 4.22–5.81)
RDW: 15.3 % (ref 11.5–15.5)
WBC: 26.5 10*3/uL — ABNORMAL HIGH (ref 4.0–10.5)
nRBC: 0 % (ref 0.0–0.2)

## 2022-08-02 LAB — BASIC METABOLIC PANEL
Anion gap: 14 (ref 5–15)
BUN: 20 mg/dL (ref 8–23)
CO2: 17 mmol/L — ABNORMAL LOW (ref 22–32)
Calcium: 8.1 mg/dL — ABNORMAL LOW (ref 8.9–10.3)
Chloride: 101 mmol/L (ref 98–111)
Creatinine, Ser: 1.04 mg/dL (ref 0.61–1.24)
GFR, Estimated: >60 mL/min (ref >60)
Glucose, Bld: 140 mg/dL — ABNORMAL HIGH (ref 70–99)
Potassium: 4.3 mmol/L (ref 3.5–5.1)
Sodium: 132 mmol/L — ABNORMAL LOW (ref 135–145)

## 2022-08-02 LAB — GLUCOSE, CAPILLARY
Glucose-Capillary: 116 mg/dL — ABNORMAL HIGH (ref 70–99)
Glucose-Capillary: 121 mg/dL — ABNORMAL HIGH (ref 70–99)
Glucose-Capillary: 122 mg/dL — ABNORMAL HIGH (ref 70–99)
Glucose-Capillary: 133 mg/dL — ABNORMAL HIGH (ref 70–99)
Glucose-Capillary: 138 mg/dL — ABNORMAL HIGH (ref 70–99)
Glucose-Capillary: 150 mg/dL — ABNORMAL HIGH (ref 70–99)
Glucose-Capillary: 188 mg/dL — ABNORMAL HIGH (ref 70–99)

## 2022-08-02 LAB — LACTIC ACID, PLASMA
Lactic Acid, Venous: 2 mmol/L (ref 0.5–1.9)
Lactic Acid, Venous: 1.8 mmol/L (ref 0.5–1.9)

## 2022-08-02 LAB — CORTISOL: Cortisol, Plasma: 19.5 ug/dL

## 2022-08-02 LAB — MRSA NEXT GEN BY PCR, NASAL: MRSA by PCR Next Gen: DETECTED — AB

## 2022-08-02 LAB — MAGNESIUM
Magnesium: 1.1 mg/dL — ABNORMAL LOW (ref 1.7–2.4)
Magnesium: 2.1 mg/dL (ref 1.7–2.4)

## 2022-08-02 LAB — PROCALCITONIN: Procalcitonin: 67.74 ng/mL

## 2022-08-02 MED ORDER — CHLORHEXIDINE GLUCONATE CLOTH 2 % EX PADS
6.0000 | MEDICATED_PAD | Freq: Every day | CUTANEOUS | Status: DC
  Administered 2022-08-02 – 2022-08-07 (×6): 6 via TOPICAL

## 2022-08-02 MED ORDER — MUPIROCIN 2 % EX OINT
1.0000 | TOPICAL_OINTMENT | Freq: Two times a day (BID) | CUTANEOUS | Status: AC
  Administered 2022-08-02 – 2022-08-06 (×10): 1 via NASAL
  Filled 2022-08-02 (×4): qty 22

## 2022-08-02 MED ORDER — MAGNESIUM SULFATE 2 GM/50ML IV SOLN
2.0000 g | INTRAVENOUS | Status: AC
  Administered 2022-08-02 (×3): 2 g via INTRAVENOUS
  Filled 2022-08-02 (×3): qty 50

## 2022-08-02 MED ORDER — MAGNESIUM SULFATE 2 GM/50ML IV SOLN: 2.0000 g | Freq: Once | INTRAVENOUS | Status: DC

## 2022-08-02 MED ORDER — METHOCARBAMOL 750 MG PO TABS
750.0000 mg | ORAL_TABLET | Freq: Three times a day (TID) | ORAL | Status: DC | PRN
  Administered 2022-08-06 – 2022-08-07 (×4): 750 mg via ORAL
  Filled 2022-08-02 (×4): qty 1

## 2022-08-02 MED ORDER — MAGNESIUM SULFATE 4 GM/100ML IV SOLN
4.0000 g | Freq: Once | INTRAVENOUS | Status: DC
  Filled 2022-08-02: qty 100

## 2022-08-02 MED ORDER — ORAL CARE MOUTH RINSE: 15.0000 mL | OROMUCOSAL | Status: DC | PRN

## 2022-08-02 MED ORDER — TRAMADOL HCL 50 MG PO TABS
50.0000 mg | ORAL_TABLET | Freq: Four times a day (QID) | ORAL | Status: DC | PRN
  Administered 2022-08-02 – 2022-08-07 (×4): 50 mg via ORAL
  Filled 2022-08-02 (×4): qty 1

## 2022-08-02 MED ORDER — SODIUM CHLORIDE 0.9 % IV SOLN
2.0000 g | Freq: Three times a day (TID) | INTRAVENOUS | Status: DC
  Administered 2022-08-02 – 2022-08-03 (×4): 2 g via INTRAVENOUS
  Filled 2022-08-02 (×4): qty 12.5

## 2022-08-02 NOTE — Progress Notes (Signed)
NAME:  [name], MRN:  [id number], DOB:  29-Aug-1945, LOS: 1 ADMISSION DATE:  08/01/2022, CONSULTATION DATE:  4/19 REFERRING MD:  Dr. Elpidio Anis, CHIEF COMPLAINT:  urosepsis   History of Present Illness:  Patient is a 77 year old male with pertinent PMH HTN, HLD, CAD, paroxysmal atrial tachycardia, hypothyroidism, T2DM, depression/anxiety presents to [facility] ED on 4/19 with urosepsis.  Patient recently admitted on 05/2022 with right upper extremity weakness and recurrent falls.  CT head negative for acute abnormality.  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  NSG consulted but do not believe this is the cause of his right upper extremity weakness.  MRI of shoulder showing multiple rotator cuff tears and bony Bankart fracture.  Ortho consulted recommended conservative management.  Patient discharged on 2/27.  Patient has since been working with physical therapy outpatient.  On 4/19 patient had finished working with PT and states he passed a kidney stone. Later today he slipped out of chair and landed on his backside.  No loss of consciousness or head trauma.  States he has been having generalized weakness and vomited once this morning.  Came to [facility] ED for further eval.  Upon arrival to [facility] ED on 4/19, patient stating he is having some back pain.  Initially patient's BP stable.  Tachycardia 110s and RR 22.  Afebrile.  On room air with sats 99%.  UA with large leukocytes, LA 2.1, and WBC 21.7.  Sepsis protocol initiated.  Patient given IV fluids and cultures obtained.  Patient started on cefepime.  CXR with no significant findings. CT abd/pelvis: Hepatic steatosis; bilateral renal calculi 5 mm renal calculus is seen within the dependent portion of the left renal pelvis. This is located within the mid left kidney on the prior study. A 6 mm renal calculus noted within the dependent portion of a mildly dilated right renal pelvis. This is unchanged in position when compared to the prior  exam. Patient's BP continued to drop despite IV fluids.  Started on low-dose levo.  PCCM consulted for ICU admission.  Pertinent ED labs: NA 132, creat 1.29, Hgb 9.4, COVID/flu negative  Pertinent  Medical History  CAD Depression GERD Gout HLD HTN Hypothyroidism Obesity DMII Cervical Radiculopathy  Significant Hospital Events: Including procedures, antibiotic start and stop dates in addition to other pertinent events   4/19 admitted to [facility] with generalized weakness; urosepsis and started on levo  Interim History / Subjective:   No acute events overnight Remains on levophed 68mcg/kg  Objective   Blood pressure (!) 113/51, pulse 79, temperature 98.4 F (36.9 C), temperature source Oral, resp. rate (!) 24, height 5\' 9"  (1.753 m), weight 109.7 kg, SpO2 96 %.        Intake/Output Summary (Last 24 hours) at 08/02/2022 0732 Last data filed at 08/02/2022 0700 Gross per 24 hour  Intake 6379.3 ml  Output 1425 ml  Net 4954.3 ml    Filed Weights   08/01/22 1539 08/02/22 0100  Weight: 104.3 kg 109.7 kg    Examination: General:  obese, NAD HEENT: [place]/AT, moist mucous membranes Neuro: Aox3, moving all extremities CV: rrr, no m/r/g PULM:  clear to auscultation, no wheezing GI: soft, bsx4 active  Extremities: warm/dry, no edema Skin: no rashes or lesions    Resolved Hospital Problem list     Assessment & Plan:   Septic shock UTI B/l renal calculi: Hx of kidney stones; CT abd/pelvis A 5 mm renal calculus is seen within the dependent portion of the  left renal pelvis. This is located within the mid left kidney on the prior study. A 6 mm renal calculus noted within the dependent portion of a mildly dilated right renal pelvis. Unchanged from previous CT in 11/2016. Plan: -Continue levo for MAP goal greater than 65 -Continue IV fluids -Lactic acid has cleared -Random cortiosl is 19, will hold off on stress dose steroids at this time -Follow-up cultures -Continue  cefepime -Trend WBC/fever curve - will need outpatient urology follow up  AKI Hyponatremia: Likely hypovolemia Plan: -Continue IV fluids, LR at 13mL/hr will end around 4pm -Trend BMP / urinary output -Replace electrolytes as indicated -Avoid nephrotoxic agents, ensure adequate renal perfusion  HTN HLD CAD paroxysmal atrial tachycardia Plan: -Hold home antihypertensives -Resume ASA, Plavix, statin  Hepatic steatosis Plan: -cont statin  Multiple rotator cuff tears on right Chronic back pain Lumbar postlaminectomy syndrome C-spine radiculopathy Plan: -Continue supportive care -PT/OT -resumed home robaxin and tramadol but as PRN -F/U Ortho outpatient  Anemia Plan: -Trend CBC  Hypothyroidism Plan: -Continue Synthroid  T2DM Plan: -SSI and CBG monitoring  depression/anxiety Plan: -Continue sertraline  Best Practice (right click and "Reselect all SmartList Selections" daily)   Diet/type: clear liquids, advance as tolerated DVT prophylaxis: prophylactic heparin  GI prophylaxis: N/A Lines: N/A Foley:  N/A Code Status:  full code Last date of multidisciplinary goals of care discussion [4/20 updated patient at bedside]  Labs   CBC: Recent Labs  Lab 08/01/22 1558 08/02/22 0159  WBC 21.7* 26.5*  HGB 9.4* 8.9*  HCT 30.0* 28.4*  MCV 93.8 96.6  PLT 326 280     Basic Metabolic Panel: Recent Labs  Lab 08/01/22 1558 08/02/22 0203  NA 132* 132*  K 4.6 4.3  CL 102 101  CO2 20* 17*  GLUCOSE 159* 140*  BUN 20 20  CREATININE 1.29* 1.04  CALCIUM 8.2* 8.1*  MG  --  1.1*    GFR: Estimated Creatinine Clearance: 76.1 mL/min (by C-G formula based on SCr of 1.04 mg/dL). Recent Labs  Lab 08/01/22 1558 08/01/22 1815 08/02/22 0159  PROCALCITON  --   --  67.74  WBC 21.7*  --  26.5*  LATICACIDVEN 2.1* 2.2* 2.0*     Liver Function Tests: Recent Labs  Lab 08/01/22 1558  AST 22  ALT 20  ALKPHOS 62  BILITOT 1.0  PROT 6.3*  ALBUMIN 2.9*    No  results for input(s): "LIPASE", "AMYLASE" in the last 168 hours. No results for input(s): "AMMONIA" in the last 168 hours.  ABG    Component Value Date/Time   TCO2 21 (L) 06/04/2022 0800     Coagulation Profile: Recent Labs  Lab 08/01/22 1630  INR 1.2     Cardiac Enzymes: No results for input(s): "CKTOTAL", "CKMB", "CKMBINDEX", "TROPONINI" in the last 168 hours.  HbA1C: Hgb A1c MFr Bld  Date/Time Value Ref Range Status  06/05/2022 05:10 AM 8.1 (H) 4.8 - 5.6 % Final    Comment:    (NOTE) Pre diabetes:          5.7%-6.4%  Diabetes:              >6.4%  Glycemic control for   <7.0% adults with diabetes   11/08/2015 08:36 AM 6.8 (H) 4.8 - 5.6 % Final    Comment:    (NOTE)         Pre-diabetes: 5.7 - 6.4         Diabetes: >6.4         Glycemic control  for adults with diabetes: <7.0     CBG: Recent Labs  Lab 08/02/22 0046 08/02/22 0318  GLUCAP 121* 138*       Critical care time: 40 minutes    Melody Comas, MD [facility] Office: [phone]   See Amion for personal pager PCCM on call pager [phone] until 7pm. Please call Elink 7p-7a. [phone]

## 2022-08-02 NOTE — Progress Notes (Addendum)
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  He had bilateral renal calculi with right-sided dilated renal pelvis-stable.  He eventually developed hypotension and was started on norepinephrine.  Critical care was consulted for further management.  Tachycardia has improved, he received a total of 4.5 L of fluid intake.  Currently on 4 mcg of norepinephrine.  He received cefepime and Flagyl as treatment of his sepsis.  Labs showed leukocytosis of 21.7, mild hyperglycemia, and mildly elevated lactic acid.  Blood and urine cultures were drawn.  Chest radiograph unremarkable.  Although stable and generally possible in size, if his sepsis worsens, may need to discuss the case with urology to address his ureterolithiasis, no significant hydronephrosis or hydroureter at this time.    eICU Interventions  I agree with current antibiotics with cefepime, can likely defer metronidazole.  Home meds are continued, sliding scale insulin is in place.  DVT prophylaxis with heparin.  GI prophylaxis not indicated.  Maintain norepinephrine and crystalloid solutions for now.  Ultimately, no intervention is indicated at this time.    0245 -given wide pulse pressures, it would be reasonable to pursue a systolic goal of 409.  Patient appears to be perfusing appropriately otherwise.  Continue peripheral norepi   Intervention Category Evaluation Type: New Patient Evaluation  Aavya Shafer 08/02/2022, 1:06 AM

## 2022-08-02 NOTE — Evaluation (Signed)
Physical Therapy Evaluation Patient Details Name: [name] MRN: [id number] DOB: October 11, 1945 Today's Date: 08/02/2022  History of Present Illness  Mr. [name] is a 77 y/o male who presented on 06/04/22 after falling out of bed and having R UE weakness. Imaging neg for acute intracranial abnormality but did show cervical and lumbar spinal stenosis. PMH: chronic back pain with left lower extremity radicular pain, HTN, hypothyroidism, CAD s/p PCI 1999, T2DM, gout, GERD, hearing impairment and depression  Clinical Impression  Pt admitted with above diagnosis. Pt was in SNF since last admission and had gone home for one day when this occurred. He is very angry about this situation which made eval difficult and difficult to accurately assess cognition. Pt with R lateral thigh pain that inhibited his ability to stand and ambulate. Pt needed mod A to come to sitting and once up had posterior LOB needing max A to recover balance back to upright. Pt needed max A +2 to stand and could not step L foot due to R knee buckle. Xray R femur neg for fx. Patient will benefit from continued inpatient follow up therapy, <3 hours/day. However, pt adamant that he wants to go straight home. Attempted to discuss him potentially going home at w/c level but pt too riled up about having to be here to have a productive conversation.   Pt currently with functional limitations due to the deficits listed below (see PT Problem List). Pt will benefit from acute skilled PT to increase their independence and safety with mobility to allow discharge.          Recommendations for follow up therapy are one component of a multi-disciplinary discharge planning process, led by the attending physician.  Recommendations may be updated based on patient status, additional functional criteria and insurance authorization.  Follow Up Recommendations Can patient physically be transported by private vehicle: No     Assistance Recommended at  Discharge Frequent or constant Supervision/Assistance  Patient can return home with the following  Two people to help with walking and/or transfers;A lot of help with bathing/dressing/bathroom;Direct supervision/assist for medications management;Direct supervision/assist for financial management;Help with stairs or ramp for entrance;Assist for transportation;Assistance with cooking/housework    Equipment Recommendations None recommended by PT  Recommendations for Other Services  OT consult    Functional Status Assessment Patient has had a recent decline in their functional status and demonstrates the ability to make significant improvements in function in a reasonable and predictable amount of time.     Precautions / Restrictions Precautions Precautions: Fall Precaution Comments: h/o multiple falls Restrictions Weight Bearing Restrictions: No Other Position/Activity Restrictions: keep SBP >100      Mobility  Bed Mobility Overal bed mobility: Needs Assistance Bed Mobility: Supine to Sit, Sit to Supine     Supine to sit: Mod assist Sit to supine: Max assist, +2 for physical assistance   General bed mobility comments: pt able to move LE's off EOB but needed mod A to elevate trunk into sitting. Shortly afte sitting up pt lost balance bkwds in sitting and needed max A to come back up    Transfers Overall transfer level: Needs assistance Equipment used: Rolling walker (2 wheels) Transfers: Sit to/from Stand Sit to Stand: +2 physical assistance, Max assist           General transfer comment: max A +2 for power up and due to R thigh pain.    Ambulation/Gait  General Gait Details: pt attempted to step feet but was unable to step LLE due to R knee buckle with wt shift  Stairs            Wheelchair Mobility    Modified Rankin (Stroke Patients Only)       Balance Overall balance assessment: Needs assistance Sitting-balance support: Bilateral  upper extremity supported, Feet supported Sitting balance-Leahy Scale: Poor Sitting balance - Comments: needed min to mod A to maintain sitting due to posterior lean Postural control: Posterior lean Standing balance support: Bilateral upper extremity supported, During functional activity, Reliant on assistive device for balance Standing balance-Leahy Scale: Poor Standing balance comment: mod A needed to stand with UE support                             Pertinent Vitals/Pain Pain Assessment Pain Assessment: Faces Faces Pain Scale: Hurts even more Pain Location: R lateral thigh Pain Descriptors / Indicators: Aching, Sore Pain Intervention(s): Limited activity within patient's tolerance, Monitored during session, Other (comment) (relayed to RN)    Home Living Family/patient expects to be discharged to:: Private residence Living Arrangements: Alone Available Help at Discharge: Personal care attendant Type of Home: Apartment Home Access: Level entry       Home Layout: One level Home Equipment: Rollator (4 wheels);Grab bars - tub/shower;Electric scooter;Grab bars - toilet;Other (comment) (bedrail, lift chair) Additional Comments: pt had been home from SNF only one day. Caregivers to come in morning and evening for a few hours    Prior Function Prior Level of Function : Needs assist             Mobility Comments: was walking with PT with RW at SNF before d/c home ADLs Comments: Not driving.Friend assists with transportation.     Hand Dominance   Dominant Hand: Right    Extremity/Trunk Assessment   Upper Extremity Assessment Upper Extremity Assessment: Defer to OT evaluation;RUE deficits/detail RUE Deficits / Details: minimal use of RUE due to RTC tears and cervical stenosis. Pt is R handed    Lower Extremity Assessment Lower Extremity Assessment: RLE deficits/detail;Generalized weakness RLE Deficits / Details: hip flex 3-/5, knee ext 3/5 but pt with  increased pain R lateral thigh with sit>stand and R knee buckling when attempting to step LLE. Xray of R femur done after eval, it was neg for fx. Pt did fall on R side PTA. RLE Sensation: WNL RLE Coordination: decreased gross motor    Cervical / Trunk Assessment Cervical / Trunk Assessment: Kyphotic  Communication   Communication: HOH  Cognition Arousal/Alertness: Awake/alert Behavior During Therapy: Agitated Overall Cognitive Status: Difficult to assess                                 General Comments: pt agitated and very grumpy today, keeps stating he wished he had never come back here. Due to his distraction, most info relayed by sister        General Comments General comments (skin integrity, edema, etc.): VSS    Exercises General Exercises - Lower Extremity Ankle Circles/Pumps: AROM, Both, Supine, 15 reps   Assessment/Plan    PT Assessment Patient needs continued PT services  PT Problem List Decreased strength;Decreased activity tolerance;Decreased balance;Decreased mobility;Decreased coordination;Pain;Decreased knowledge of use of DME       PT Treatment Interventions DME instruction;Gait training;Functional mobility training;Therapeutic activities;Therapeutic exercise;Balance training;Neuromuscular re-education;Patient/family education;Cognitive remediation;Wheelchair  mobility training    PT Goals (Current goals can be found in the Care Plan section)  Acute Rehab PT Goals Patient Stated Goal: return home, pt adamant that he does not want to go back to SNF PT Goal Formulation: With patient Time For Goal Achievement: 08/16/22 Potential to Achieve Goals: Fair    Frequency Min 3X/week     Co-evaluation               AM-PAC PT "6 Clicks" Mobility  Outcome Measure Help needed turning from your back to your side while in a flat bed without using bedrails?: A Lot Help needed moving from lying on your back to sitting on the side of a flat bed  without using bedrails?: A Lot Help needed moving to and from a bed to a chair (including a wheelchair)?: Total Help needed standing up from a chair using your arms (e.g., wheelchair or bedside chair)?: Total Help needed to walk in hospital room?: Total Help needed climbing 3-5 steps with a railing? : Total 6 Click Score: 8    End of Session Equipment Utilized During Treatment: Gait belt Activity Tolerance: Patient limited by pain;Patient limited by fatigue Patient left: in bed;with call bell/phone within reach;with bed alarm set;with family/visitor present;with nursing/sitter in room Nurse Communication: Mobility status;Other (comment) (RLE pain) PT Visit Diagnosis: Muscle weakness (generalized) (M62.81);History of falling (Z91.81);Repeated falls (R29.6);Difficulty in walking, not elsewhere classified (R26.2);Pain Pain - Right/Left: Right Pain - part of body: Leg    Time: 0947-1020 PT Time Calculation (min) (ACUTE ONLY): 33 min   Charges:   PT Evaluation $PT Eval Moderate Complexity: 1 Mod PT Treatments $Therapeutic Activity: 8-22 mins        Lyanne Co, PT  Acute Rehab Services Secure chat preferred Office [phone]   Lawana Chambers Antonio Creswell 08/02/2022, 1:48 PM

## 2022-08-02 NOTE — Progress Notes (Signed)
Pharmacy Antibiotic Note  [name] is a 77 y.o. male for which pharmacy has been consulted for cefepime dosing for  IAI .  Patient with a history of anxiety, HTN, hypothyroidism, T2DM, depression, CAD, HLD, GERD, IBS, CAD, chronic back pain, paroxysmal atrial tachycardia . Patient presenting with weakness and fall.  SCr 1.29 > 1.04 (improved) WBC up 26.5; Tm 99.7; PCT 67.74  Plan: Change cefepime to 2 g IV q8h Trend WBC, Fever, Renal function F/u cultures, clinical course, WBC De-escalate when able  Height:  (175.3 cm) Weight: 109.7 kg (241 lb 13.5 oz) IBW/kg (Calculated) : 70.7  Temp (24hrs), Avg:98.6 F (37 C), Min:98.1 F (36.7 C), Max:99.7 F (37.6 C)  Recent Labs  Lab 08/01/22 1558 08/01/22 1815 08/02/22 0159 08/02/22 0203 08/02/22 0608  WBC 21.7*  --  26.5*  --   --   CREATININE 1.29*  --   --  1.04  --   LATICACIDVEN 2.1* 2.2* 2.0*  --  1.8     Estimated Creatinine Clearance: 76.1 mL/min (by C-G formula based on SCr of 1.04 mg/dL).    Allergies  Allergen Reactions   Misc. Sulfonamide Containing Compounds Rash   Sulfa Antibiotics Rash   Microbiology results: 4/19 BCx2: ngtd 4/19 UCx:   Thank you for involving pharmacy in this patient's care.  Loura Back, PharmD, BCPS Clinical Pharmacist Clinical phone for 08/02/2022 is [phone] 08/02/2022 11:26 AM

## 2022-08-02 NOTE — Progress Notes (Signed)
[facility] ADULT ICU REPLACEMENT PROTOCOL   The patient does apply for the [facility] Adult ICU Electrolyte Replacment Protocol based on the criteria listed below:   1.Exclusion criteria: TCTS, ECMO, Dialysis, and Myasthenia Gravis patients 2. Is GFR >/= 30 ml/min? Yes.    Patient's GFR today is >60 3. Is SCr </= 2? Yes.   Patient's SCr is 1.04 mg/dL 4. Did SCr increase >/= 0.5 in 24 hours? No. 5.Pt's weight >40kg  Yes.   6. Abnormal electrolyte(s): mag 1.1  7. Electrolytes replaced per protocol 8.  Call MD STAT for K+ </= 2.5, Phos </= 1, or Mag </= 1 Physician:  protocol  Melvern Banker 08/02/2022 3:47 AM

## 2022-08-02 NOTE — Progress Notes (Signed)
eLink RN Iris notified of critical lactate 2.0 and mag of 1.1.

## 2022-08-03 DIAGNOSIS — A419 Sepsis, unspecified organism: Secondary | ICD-10-CM | POA: Diagnosis not present

## 2022-08-03 DIAGNOSIS — R6521 Severe sepsis with septic shock: Secondary | ICD-10-CM | POA: Diagnosis not present

## 2022-08-03 LAB — BLOOD CULTURE ID PANEL (REFLEXED) - BCID2

## 2022-08-03 LAB — URINE CULTURE: Culture: 30000 — AB

## 2022-08-03 LAB — GLUCOSE, CAPILLARY
Glucose-Capillary: 118 mg/dL — ABNORMAL HIGH (ref 70–99)
Glucose-Capillary: 119 mg/dL — ABNORMAL HIGH (ref 70–99)
Glucose-Capillary: 158 mg/dL — ABNORMAL HIGH (ref 70–99)
Glucose-Capillary: 168 mg/dL — ABNORMAL HIGH (ref 70–99)
Glucose-Capillary: 160 mg/dL — ABNORMAL HIGH (ref 70–99)
Glucose-Capillary: 174 mg/dL — ABNORMAL HIGH (ref 70–99)

## 2022-08-03 LAB — CULTURE, BLOOD (ROUTINE X 2)

## 2022-08-03 MED ORDER — SODIUM CHLORIDE 0.9 % IV SOLN
2.0000 g | INTRAVENOUS | Status: DC
  Administered 2022-08-03 – 2022-08-04 (×2): 2 g via INTRAVENOUS
  Filled 2022-08-03 (×2): qty 20

## 2022-08-03 NOTE — Evaluation (Signed)
Occupational Therapy Evaluation Patient Details Name: [name] MRN: [id number] DOB: 1945/04/27 Today's Date: 08/03/2022   History of Present Illness [name] is a 77 y/o male who presented on 06/04/22 after falling out of bed and having R UE weakness. Imaging neg for acute intracranial abnormality but did show cervical and lumbar spinal stenosis. PMH: chronic back pain with left lower extremity radicular pain, HTN, hypothyroidism, CAD s/p PCI 1999, T2DM, gout, GERD, hearing impairment and depression   Clinical Impression   Patient admitted for the diagnosis above.  Patient had just returned home from a recent rehab stay at a local SNF, and was scheduled to start [facility].  Patient lives alone, and has PCA assist for iADL, and most likely ADL assist.  Currently he is very fearful of falling, has chronic R knee and shoulder dysfunction, and poor dynamic balance.  Currently he was able to stand better this date, and could weight shift and move his L leg to transfer to the recliner.  He is needing up to Max A for lower body ADL and Min of 2 for basic transfers.  OT is indicated in the acute setting to address deficits, and Patient will benefit from continued inpatient follow up therapy, <3 hours/day.  He would prefer to return home, but is unsure if he can manage.        Recommendations for follow up therapy are one component of a multi-disciplinary discharge planning process, led by the attending physician.  Recommendations may be updated based on patient status, additional functional criteria and insurance authorization.   Assistance Recommended at Discharge Frequent or constant Supervision/Assistance  Patient can return home with the following A lot of help with walking and/or transfers;A lot of help with bathing/dressing/bathroom;Assist for transportation;Assistance with cooking/housework    Functional Status Assessment  Patient has had a recent decline in their functional status and demonstrates  the ability to make significant improvements in function in a reasonable and predictable amount of time.  Equipment Recommendations  Wheelchair (measurements OT);Wheelchair cushion (measurements OT)    Recommendations for Other Services       Precautions / Restrictions Precautions Precautions: Fall Precaution Comments: h/o multiple falls Restrictions Weight Bearing Restrictions: No Other Position/Activity Restrictions: R RCT with limited shoulder ROM      Mobility Bed Mobility Overal bed mobility: Needs Assistance Bed Mobility: Supine to Sit     Supine to sit: Mod assist, HOB elevated       Patient Response: Cooperative  Transfers Overall transfer level: Needs assistance Equipment used: Rolling walker (2 wheels) Transfers: Sit to/from Stand, Bed to chair/wheelchair/BSC Sit to Stand: Min guard, +2 physical assistance     Step pivot transfers: Min assist, Mod assist, +2 physical assistance     General transfer comment: very fearful of falling      Balance Overall balance assessment: Needs assistance Sitting-balance support: Feet supported Sitting balance-Leahy Scale: Fair     Standing balance support: Reliant on assistive device for balance Standing balance-Leahy Scale: Poor                             ADL either performed or assessed with clinical judgement   ADL Overall ADL's : Needs assistance/impaired Eating/Feeding: Set up;Bed level   Grooming: Wash/dry hands;Wash/dry face;Set up;Sitting   Upper Body Bathing: Minimal assistance;Sitting   Lower Body Bathing: Maximal assistance;Sit to/from stand   Upper Body Dressing : Minimal assistance;Sitting   Lower Body Dressing: Maximal assistance;Sit  to/from stand   Toilet Transfer: Minimal assistance;Rolling walker (2 wheels);+2 for physical assistance;+2 for safety/equipment;Moderate assistance   Toileting- Clothing Manipulation and Hygiene: Total assistance;Sit to/from stand                Vision Patient Visual Report: No change from baseline       Perception     Praxis      Pertinent Vitals/Pain Pain Assessment Pain Assessment: Faces Faces Pain Scale: Hurts a little bit Pain Location: R lateral thigh Pain Descriptors / Indicators: Tender Pain Intervention(s): Monitored during session     Hand Dominance Right   Extremity/Trunk Assessment Upper Extremity Assessment RUE Deficits / Details: minimal use of RUE due to RTC tears and cervical stenosis. Pt is R handed   Lower Extremity Assessment Lower Extremity Assessment: Defer to PT evaluation   Cervical / Trunk Assessment Cervical / Trunk Assessment: Kyphotic   Communication Communication Communication: HOH   Cognition Arousal/Alertness: Awake/alert Behavior During Therapy: WFL for tasks assessed/performed Overall Cognitive Status: Within Functional Limits for tasks assessed                                       General Comments   VSS on RA    Exercises     Shoulder Instructions      Home Living Family/patient expects to be discharged to:: Private residence Living Arrangements: Alone Available Help at Discharge: Personal care attendant Type of Home: Apartment Home Access: Level entry     Home Layout: One level     Bathroom Shower/Tub: Chief Strategy Officer: Standard     Home Equipment: Rollator (4 wheels);Grab bars - tub/shower;Electric scooter;Grab bars - toilet;Other (comment)   Additional Comments: pt had been home from SNF only one day. Caregivers to come in morning and evening for a few hours      Prior Functioning/Environment Prior Level of Function : Needs assist             Mobility Comments: was walking with PT with RW at SNF before d/c home ADLs Comments: Not driving.Friend assists with transportation.        OT Problem List: Decreased strength;Decreased range of motion;Decreased activity tolerance;Impaired balance (sitting and/or  standing);Pain      OT Treatment/Interventions: Self-care/ADL training;Therapeutic exercise;Therapeutic activities;DME and/or AE instruction;Balance training;Patient/family education    OT Goals(Current goals can be found in the care plan section) Acute Rehab OT Goals Patient Stated Goal: patient wants to return home OT Goal Formulation: With patient Time For Goal Achievement: 08/15/22 Potential to Achieve Goals: Fair ADL Goals Pt Will Perform Grooming: with supervision;standing Pt Will Perform Upper Body Dressing: with set-up;sitting Pt Will Perform Lower Body Dressing: with min guard assist;sit to/from stand Pt Will Transfer to Toilet: with min guard assist;stand pivot transfer;regular height toilet;bedside commode Pt Will Perform Toileting - Clothing Manipulation and hygiene: with supervision;sitting/lateral leans Pt/caregiver will Perform Home Exercise Program: Increased ROM;Both right and left upper extremity  OT Frequency: Min 2X/week    Co-evaluation              AM-PAC OT "6 Clicks" Daily Activity     Outcome Measure Help from another person eating meals?: None Help from another person taking care of personal grooming?: A Little Help from another person toileting, which includes using toliet, bedpan, or urinal?: A Lot Help from another person bathing (including washing, rinsing, drying)?: A Lot Help  from another person to put on and taking off regular upper body clothing?: A Little Help from another person to put on and taking off regular lower body clothing?: A Lot 6 Click Score: 16   End of Session Equipment Utilized During Treatment: Gait belt;Rolling walker (2 wheels) Nurse Communication: Mobility status  Activity Tolerance: Patient tolerated treatment well Patient left: in chair;with call bell/phone within reach;with chair alarm set  OT Visit Diagnosis: Unsteadiness on feet (R26.81);Repeated falls (R29.6);Muscle weakness (generalized) (M62.81);Pain Pain -  Right/Left: Right Pain - part of body: Leg                Time: 1308-6578 OT Time Calculation (min): 18 min Charges:  OT General Charges $OT Visit: 1 Visit OT Evaluation $OT Eval Moderate Complexity: 1 Mod  08/03/2022  RP, OTR/L  Acute Rehabilitation Services  Office:  [phone]   Suzanna Obey 08/03/2022, 10:14 AM

## 2022-08-03 NOTE — Progress Notes (Signed)
NAME:  [name], MRN:  [id number], DOB:  [birth year], LOS: 2 ADMISSION DATE:  08/01/2022, CONSULTATION DATE:  4/19 REFERRING MD:  Dr. Elpidio Anis, CHIEF COMPLAINT:  urosepsis   History of Present Illness:  Patient is a 77 year old male with pertinent PMH HTN, HLD, CAD, paroxysmal atrial tachycardia, hypothyroidism, T2DM, depression/anxiety presents to [facility] ED on 4/19 with urosepsis.  Patient recently admitted on 05/2022 with right upper extremity weakness and recurrent falls.  CT head negative for acute abnormality.  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  NSG consulted but do not believe this is the cause of his right upper extremity weakness.  MRI of shoulder showing multiple rotator cuff tears and bony Bankart fracture.  Ortho consulted recommended conservative management.  Patient discharged on 2/27.  Patient has since been working with physical therapy outpatient.  On 4/19 patient had finished working with PT and states he passed a kidney stone. Later today he slipped out of chair and landed on his backside.  No loss of consciousness or head trauma.  States he has been having generalized weakness and vomited once this morning.  Came to [facility] ED for further eval.  Upon arrival to [facility] ED on 4/19, patient stating he is having some back pain.  Initially patient's BP stable.  Tachycardia 110s and RR 22.  Afebrile.  On room air with sats 99%.  UA with large leukocytes, LA 2.1, and WBC 21.7.  Sepsis protocol initiated.  Patient given IV fluids and cultures obtained.  Patient started on cefepime.  CXR with no significant findings. CT abd/pelvis: Hepatic steatosis; bilateral renal calculi 5 mm renal calculus is seen within the dependent portion of the left renal pelvis. This is located within the mid left kidney on the prior study. A 6 mm renal calculus noted within the dependent portion of a mildly dilated right renal pelvis. This is unchanged in position when compared to the prior  exam. Patient's BP continued to drop despite IV fluids.  Started on low-dose levo.  PCCM consulted for ICU admission.  Pertinent ED labs: NA 132, creat 1.29, Hgb 9.4, COVID/flu negative  Pertinent  Medical History  CAD Depression GERD Gout HLD HTN Hypothyroidism Obesity DMII Cervical Radiculopathy  Significant Hospital Events: Including procedures, antibiotic start and stop dates in addition to other pertinent events   4/19 admitted to [facility] with generalized weakness; urosepsis and started on levo 4/20 weaned off levophed, PT concerned for inability to bear weight on right leg  Interim History / Subjective:   No acute events overnight Weaned off levophed yesterday   Objective   Blood pressure 134/63, pulse 85, temperature 98.4 F (36.9 C), temperature source Oral, resp. rate 18, height  (1.753 m), weight 109.7 kg, SpO2 97 %.        Intake/Output Summary (Last 24 hours) at 08/03/2022 0720 Last data filed at 08/03/2022 0700 Gross per 24 hour  Intake 2215.47 ml  Output 4200 ml  Net -1984.53 ml   Filed Weights   08/01/22 1539 08/02/22 0100 08/03/22 0411  Weight: 104.3 kg 109.7 kg 109.7 kg    Examination: General:  obese, NAD HEENT: [place]/AT, moist mucous membranes Neuro: Aox3, moving all extremities CV: rrr, no m/r/g PULM:  clear to auscultation, no wheezing GI: soft, bsx4 active  Extremities: warm/dry, no edema Skin: no rashes or lesions    Resolved Hospital Problem list   Septic Shock  Assessment & Plan:   Sepsis UTI B/l renal calculi: Hx of kidney  stones; CT abd/pelvis A 5 mm renal calculus is seen within the dependent portion of the left renal pelvis. This is located within the mid left kidney on the prior study. A 6 mm renal calculus noted within the dependent portion of a mildly dilated right renal pelvis. Unchanged from previous CT in 11/2016. Plan: -weaned off levophed yesterday -Lactic acid has cleared -Random cortiosl is 19, will hold off on  stress dose steroids at this time -Follow-up cultures -Continue cefepime -Trend WBC/fever curve - will need outpatient urology follow up  AKI Hyponatremia: Likely hypovolemia Plan: -Trend BMP / urinary output -Replace electrolytes as indicated -Avoid nephrotoxic agents, ensure adequate renal perfusion  HTN HLD CAD paroxysmal atrial tachycardia Plan: -Hold home antihypertensives for now -Resume ASA, Plavix, statin  Hepatic steatosis Plan: -cont statin  Multiple rotator cuff tears on right Chronic back pain Lumbar postlaminectomy syndrome C-spine radiculopathy PT concerned for inability to bear weight on right leg Plan: -Continue supportive care -PT/OT -resumed home robaxin and tramadol but as PRN -F/U Ortho outpatient - f/u femur x-rays  Anemia Plan: -Trend CBC  Hypothyroidism Plan: -Continue Synthroid  T2DM Plan: -SSI and CBG monitoring  depression/anxiety Plan: -Continue sertraline  Best Practice (right click and "Reselect all SmartList Selections" daily)   Diet/type: Regular consistency (see orders) DVT prophylaxis: prophylactic heparin  GI prophylaxis: N/A Lines: N/A Foley:  N/A Code Status:  full code Last date of multidisciplinary goals of care discussion [4/21 updated patient at bedside]  Labs   CBC: Recent Labs  Lab 08/01/22 1558 08/02/22 0159  WBC 21.7* 26.5*  HGB 9.4* 8.9*  HCT 30.0* 28.4*  MCV 93.8 96.6  PLT 326 280    Basic Metabolic Panel: Recent Labs  Lab 08/01/22 1558 08/02/22 0203 08/02/22 1939  NA 132* 132*  --   K 4.6 4.3  --   CL 102 101  --   CO2 20* 17*  --   GLUCOSE 159* 140*  --   BUN 20 20  --   CREATININE 1.29* 1.04  --   CALCIUM 8.2* 8.1*  --   MG  --  1.1* 2.1   GFR: Estimated Creatinine Clearance: 76.1 mL/min (by C-G formula based on SCr of 1.04 mg/dL). Recent Labs  Lab 08/01/22 1558 08/01/22 1815 08/02/22 0159 08/02/22 0608  PROCALCITON  --   --  67.74  --   WBC 21.7*  --  26.5*  --    LATICACIDVEN 2.1* 2.2* 2.0* 1.8    Liver Function Tests: Recent Labs  Lab 08/01/22 1558  AST 22  ALT 20  ALKPHOS 62  BILITOT 1.0  PROT 6.3*  ALBUMIN 2.9*   No results for input(s): "LIPASE", "AMYLASE" in the last 168 hours. No results for input(s): "AMMONIA" in the last 168 hours.  ABG    Component Value Date/Time   TCO2 21 (L) 06/04/2022 0800     Coagulation Profile: Recent Labs  Lab 08/01/22 1630  INR 1.2    Cardiac Enzymes: No results for input(s): "CKTOTAL", "CKMB", "CKMBINDEX", "TROPONINI" in the last 168 hours.  HbA1C: Hgb A1c MFr Bld  Date/Time Value Ref Range Status  06/05/2022 05:10 AM 8.1 (H) 4.8 - 5.6 % Final    Comment:    (NOTE) Pre diabetes:          5.7%-6.4%  Diabetes:              >6.4%  Glycemic control for   <7.0% adults with diabetes   11/08/2015 08:36 AM 6.8 (H)  4.8 - 5.6 % Final    Comment:    (NOTE)         Pre-diabetes: 5.7 - 6.4         Diabetes: >6.4         Glycemic control for adults with diabetes: <7.0     CBG: Recent Labs  Lab 08/02/22 1151 08/02/22 1556 08/02/22 1941 08/02/22 2352 08/03/22 0312  GLUCAP 188* 150* 116* 122* 118*       Critical care time: n/a    Melody Comas, MD [facility] Office: [phone]   See Amion for personal pager PCCM on call pager [phone] until 7pm. Please call Elink 7p-7a. [phone]

## 2022-08-04 DIAGNOSIS — R6521 Severe sepsis with septic shock: Secondary | ICD-10-CM | POA: Diagnosis not present

## 2022-08-04 DIAGNOSIS — A419 Sepsis, unspecified organism: Secondary | ICD-10-CM | POA: Diagnosis not present

## 2022-08-04 LAB — GLUCOSE, CAPILLARY
Glucose-Capillary: 149 mg/dL — ABNORMAL HIGH (ref 70–99)
Glucose-Capillary: 154 mg/dL — ABNORMAL HIGH (ref 70–99)
Glucose-Capillary: 178 mg/dL — ABNORMAL HIGH (ref 70–99)
Glucose-Capillary: 179 mg/dL — ABNORMAL HIGH (ref 70–99)
Glucose-Capillary: 200 mg/dL — ABNORMAL HIGH (ref 70–99)
Glucose-Capillary: 202 mg/dL — ABNORMAL HIGH (ref 70–99)

## 2022-08-04 LAB — URINE CULTURE

## 2022-08-04 LAB — CBC
HCT: 25.4 % — ABNORMAL LOW (ref 39.0–52.0)
Hemoglobin: 8.2 g/dL — ABNORMAL LOW (ref 13.0–17.0)
MCH: 29.4 pg (ref 26.0–34.0)
MCHC: 32.3 g/dL (ref 30.0–36.0)
MCV: 91 fL (ref 80.0–100.0)
Platelets: 226 10*3/uL (ref 150–400)
RBC: 2.79 MIL/uL — ABNORMAL LOW (ref 4.22–5.81)
RDW: 14.9 % (ref 11.5–15.5)
WBC: 8.8 10*3/uL (ref 4.0–10.5)
nRBC: 0 % (ref 0.0–0.2)

## 2022-08-04 LAB — BASIC METABOLIC PANEL
Anion gap: 8 (ref 5–15)
BUN: 12 mg/dL (ref 8–23)
CO2: 25 mmol/L (ref 22–32)
Calcium: 8.3 mg/dL — ABNORMAL LOW (ref 8.9–10.3)
Chloride: 104 mmol/L (ref 98–111)
Creatinine, Ser: 0.77 mg/dL (ref 0.61–1.24)
GFR, Estimated: >60 mL/min (ref >60)
Glucose, Bld: 157 mg/dL — ABNORMAL HIGH (ref 70–99)
Potassium: 3.9 mmol/L (ref 3.5–5.1)
Sodium: 137 mmol/L (ref 135–145)

## 2022-08-04 NOTE — Progress Notes (Signed)
  Transition of Care (TOC) Screening Note   Patient Details  Name: [name] Date of Birth: January 12, 1946   Transition of Care [facility]) CM/SW Contact:    Mearl Latin, LCSW Phone Number: 08/04/2022, 9:10 AM    Transition of Care Department [facility]) has reviewed patient with SNF recommendation. CSW checking to see how many SNF days patient has used as he went to [facility] in February. We will continue to monitor patient advancement through interdisciplinary progression rounds. If new patient transition needs arise, please place a TOC consult.

## 2022-08-04 NOTE — [place] FL2 (Signed)
[place] MEDICAID FL2 LEVEL OF CARE FORM     IDENTIFICATION  Patient Name: [name] Birthdate: 07-29-1945 Sex: male Admission Date (Current Location): 08/01/2022  [facility] and [place] Number:  [employer] and Address:  [facility] [place]. [facility], [address] [place], [place] [address]      Provider Number: [id number]  Attending Physician Name and Address:  Lorin Glass, MD  Relative Name and Phone Number:       Current Level of Care: Hospital Recommended Level of Care: Skilled Nursing Facility Prior Approval Number:    Date Approved/Denied:   PASRR Number: [id number]  Discharge Plan: SNF    Current Diagnoses: Patient Active Problem List   Diagnosis Date Noted   Septic shock 08/01/2022   Nephrolithiasis 08/01/2022   AKI (acute kidney injury) 08/01/2022   Sepsis due to urinary tract infection 08/01/2022   Cervical radiculopathy at C5 06/10/2022   Nontraumatic complete tear of right rotator cuff 06/09/2022   Cervical disc disorder at C5-C6 level with radiculopathy 06/09/2022   Right sided weakness 06/04/2022   Weakness of right upper extremity 06/04/2022   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   Chronic, continuous use of opioids 02/12/2021   Other spondylosis, lumbar region 02/12/2021   NSVT (nonsustained ventricular tachycardia) 10/12/2020   PAT (paroxysmal atrial tachycardia) 10/12/2020   History of fusion of cervical spine 09/04/2020   Lumbar post-laminectomy syndrome 09/04/2020   Pain medication agreement 09/04/2020   Anxiety    Arthritis    Chronic back pain    Coronary artery disease    Depression    Diverticulitis    Dizziness    DJD (degenerative joint disease)    GERD (gastroesophageal reflux disease)    History of colon polyps    Hypertension    Hypothyroidism    IBS (irritable bowel syndrome)    Tuberculosis    Type II diabetes mellitus    Weakness    Abnormal stress test    Fatigue 09/06/2019    Preoperative cardiovascular examination 09/06/2019   Swelling of lower limb 06/20/2014   Claudication 06/20/2014   HNP (herniated nucleus pulposus), lumbar 06/14/2014   Displacement of lumbar intervertebral disc without myelopathy 05/30/2014   Atypical chest pain 12/28/2013   Unstable angina 12/27/2013   CAD in native artery 03/14/2013   Hyperlipidemia 03/14/2013   Atherosclerotic heart disease of native coronary artery with angina pectoris 03/14/2013   Chest pain 07/30/2011   Obesity, unspecified 07/30/2011   Essential hypertension, benign 07/30/2011   Diabetes mellitus type 2, controlled, with complications 07/30/2011    Orientation RESPIRATION BLADDER Height & Weight     Self, Time, Situation, Place  Normal Continent, External catheter Weight: 241 lb 13.5 oz (109.7 kg) Height:   (175.3 cm)  BEHAVIORAL SYMPTOMS/MOOD NEUROLOGICAL BOWEL NUTRITION STATUS      Continent Diet (See dc summary)  AMBULATORY STATUS COMMUNICATION OF NEEDS Skin   Limited Assist   PU Stage and Appropriate Care (Stage I on sacrum)                       Personal Care Assistance Level of Assistance  Bathing, Feeding, Dressing Bathing Assistance: Limited assistance Feeding assistance: Independent Dressing Assistance: Limited assistance     Functional Limitations Info  Sight, Hearing Sight Info: Impaired Hearing Info: Impaired      SPECIAL CARE FACTORS FREQUENCY  PT (By licensed PT), OT (By licensed OT)     PT Frequency: 5x/week  OT Frequency: 5x/week            Contractures Contractures Info: Not present    Additional Factors Info  Code Status, Allergies, Psychotropic, Insulin Sliding Scale Code Status Info: Full Allergies Info: Misc. Sulfonamide Containing Compounds, Sulfa Antibiotics Psychotropic Info: Zoloft Insulin Sliding Scale Info: See dc summary       Current Medications (08/04/2022):  This is the current hospital active medication list Current Facility-Administered  Medications  Medication Dose Route Frequency Provider Last Rate Last Admin   0.9 %  sodium chloride infusion  250 mL Intravenous Continuous Lidia Collum, PA-C   Stopped at 08/04/22 0031   acetaminophen (TYLENOL) tablet 650 mg  650 mg Oral Q4H PRN Lidia Collum, PA-C   650 mg at 08/04/22 1610   aspirin EC tablet 81 mg  81 mg Oral Daily Lidia Collum, PA-C   81 mg at 08/04/22 9604   atorvastatin (LIPITOR) tablet 80 mg  80 mg Oral Daily Lidia Collum, PA-C   80 mg at 08/04/22 5409   cefTRIAXone (ROCEPHIN) 2 g in sodium chloride 0.9 % 100 mL IVPB  2 g Intravenous Q24H Martina Sinner, MD   Stopped at 08/03/22 2131   Chlorhexidine Gluconate Cloth 2 % PADS 6 each  6 each Topical Daily Lynnell Catalan, MD   6 each at 08/04/22 8119   clopidogrel (PLAVIX) tablet 75 mg  75 mg Oral Q breakfast Lidia Collum, PA-C   75 mg at 08/04/22 1478   docusate sodium (COLACE) capsule 100 mg  100 mg Oral BID PRN Lidia Collum, PA-C   100 mg at 08/03/22 2956   heparin injection 5,000 Units  5,000 Units Subcutaneous Q8H Lidia Collum, PA-C   5,000 Units at 08/04/22 1351   insulin aspart (novoLOG) injection 0-9 Units  0-9 Units Subcutaneous Q4H Lidia Collum, PA-C   2 Units at 08/04/22 1253   levothyroxine (SYNTHROID) tablet 50 mcg  50 mcg Oral QAC breakfast Lidia Collum, PA-C   50 mcg at 08/04/22 2130   methocarbamol (ROBAXIN) tablet 750 mg  750 mg Oral Q8H PRN Martina Sinner, MD       mupirocin ointment (BACTROBAN) 2 % 1 Application  1 Application Nasal BID Lidia Collum, PA-C   1 Application at 08/04/22 8657   norepinephrine (LEVOPHED)  in (0.016 mg/mL) premix infusion  2-10 mcg/min Intravenous Titrated Conrad Cayuco, MD   Stopped at 08/02/22 1621   ondansetron (ZOFRAN) injection 4 mg  4 mg Intravenous Q6H PRN Lidia Collum, PA-C       Oral care mouth rinse  15 mL Mouth Rinse PRN Pia Mau D, PA-C       polyethylene glycol (MIRALAX / GLYCOLAX) packet 17 g  17 g Oral Daily PRN Pia Mau D, PA-C        sertraline (ZOLOFT) tablet 100 mg  100 mg Oral QHS Pia Mau D, PA-C   100 mg at 08/03/22 2100   traMADol (ULTRAM) tablet 50 mg  50 mg Oral Q6H PRN Martina Sinner, MD   50 mg at 08/02/22 1021     Discharge Medications: Please see discharge summary for a list of discharge medications.  Relevant Imaging Results:  Relevant Lab Results:   Additional Information SSN: [id number] [place], [place]

## 2022-08-04 NOTE — TOC Initial Note (Signed)
Transition of Care [facility]) - Initial/Assessment Note    Patient Details  Name: [name] MRN: [id number] Date of Birth: 1945-04-20  Transition of Care [facility]) CM/SW Contact:    Mearl Latin, LCSW Phone Number: 08/04/2022, 2:32 PM  Clinical Narrative:                 CSW received consult for possible SNF placement at time of discharge. CSW spoke with patient and his sister at bedside. Patient stated is tired and was recently discharged from [facility] from 06/10/22- 07/31/22 (private pay near the end). Sister expressed understanding of PT recommendation and is agreeable to SNF placement at time of discharge. She reported that she is in contact with [facility] [place] to see if they can accept him as their sister is there. CSW discussed insurance authorization process and will provide Medicare SNF ratings list. CSW will send out referrals for review and provide bed offers as available.  CSW requested [facility] review referral.    Skilled Nursing Rehab Facilities-   [email or web address]   Ratings out of 5 stars (5 the highest)   Name Address  Phone # Quality Care Staffing Health Inspection Overall  [facility] [address] [place] [phone] 4 5 2 3   [facility]  [address], [place] [phone] 4 2 5 5   [facility] [address] [place], [place] [phone] 1 3 1 1   [facility] [address] [phone] 2 2 4 4   [facility] [address] [place] [phone] 2 1 2 1   [facility] [phone] [address], [place] [phone] 3 3 4 4   [facility] [address], [place] [phone] 4 1 3 2   [facility] [address] [place] [phone] 4 1 3 2   [address] ([place]) [address] [place] [phone] 3 1 2 1   [facility] [address], [place]inette Otto [phone] 3 1 1 1   [facility] [address], [facility] [phone]  3 2 2 2   [facility] ([place]) [address], [place] [phone] 3 1 1 1   Eligha Bridegroom [address] [place] Shi [phone] 4 2 4 4           [facility] [address] [place] [phone]      [facility] [address] [place] [phone] 4 1 3 2   [facility] [address], [place]ree Ditto [phone] 3 1 5 4   [address], [place] [address], [place] [phone] 1 1 2 1   [facility] [address], [employer] [phone] 2 2 4 4           [address] (no [facility]) [address], [place] [phone] 5 5 5 5   [facility] (No Humana) [address] [place] [phone] 4 1 4 3   [facility] (No UHC) [address], [place] [phone] 5 5 5 5   [facility] [address] [employer] [phone] 2 3 5 5   [facility] [address] [place] [phone] 1 1 2 1   [facility] [address], [place] [phone] 3 1 1 1   [facility] [address] Liliane Shi [phone] 5 2 5 5   [facility]  [address] [place] [phone] 2 2 1 1   [facility] [address] [place] [phone] 3 2 1 1   [facility] [address] [place], [place] [phone] 2 2 2  2  [facility] [address], [place] [phone] [facility] [address], Evlyn Clines  [phone] [facility] [address], [place]alita Levan [phone] [facility] [place] [address] [place] [phone] [facility] (No Humana) [address], [place] [phone] [facility]) [address], [place]al[place] Levan [phone] [facility] [address] [place], [place] [phone] [facility])  [address], [place] [phone] [facility]) [address] [place] [phone] [facility] [address] [place] [phone] [address] [place], [place] [phone] [facility]) [address] [place] [phone] Expected Discharge Plan: Skilled Nursing Facility Barriers to Discharge: Continued Medical Work up, English as a second language teacher, SNF Pending bed offer   Patient Goals and CMS Choice Patient states their goals for this hospitalization and ongoing recovery are:: Rehab CMS Medicare.gov Compare Post Acute Care list provided to:: Patient Choice offered to / list presented to : Patient, Sibling [place] ownership interest in [facility].provided to:: Patient    Expected Discharge Plan and Services In-house Referral: Clinical Social Work   Post Acute Care Choice: Skilled Nursing Facility Living arrangements for the past 2 months: Single Family Home, Skilled Nursing Facility                                      Prior Living Arrangements/Services Living arrangements for the past 2 months: Single Family Home, Skilled Nursing Facility Lives with:: Self Patient language and need for interpreter reviewed:: Yes Do you feel safe going back to the place where you live?: Yes      Need for Family Participation in Patient Care: Yes (Comment) Care giver support system in place?: Yes (comment)   Criminal Activity/Legal Involvement Pertinent to Current Situation/Hospitalization: No - Comment as needed  Activities of Daily Living Home Assistive Devices/Equipment: Eyeglasses, Hearing aid ADL Screening (condition at time of admission) Patient's cognitive ability adequate to safely complete daily activities?: Yes Is the patient deaf or have difficulty hearing?: Yes Does the patient have difficulty seeing, even when wearing glasses/contacts?: Yes Does the patient have difficulty concentrating, remembering, or making decisions?: No Patient able to express need for assistance with ADLs?:  No Does the patient have difficulty dressing or bathing?: No Independently performs ADLs?: Yes (appropriate for developmental age) Does the patient have difficulty walking or climbing stairs?: No Weakness of Legs: None Weakness of Arms/Hands: Right  Permission Sought/Granted Permission sought to share information with : Facility Medical sales representative, Family Supports Permission granted to share information with : Yes, Verbal Permission Granted  Share Information with NAME: Junious Dresser  Permission granted to share info w AGENCY: SNFs  Permission granted to share info w Relationship: Sister  Permission granted to share info w Contact Information: [phone]  Emotional Assessment Appearance:: Appears stated age Attitude/Demeanor/Rapport: Guarded Affect (typically observed): Accepting, Appropriate, Guarded Orientation: : Oriented to Self, Oriented to Place, Oriented to  Time, Oriented to Situation Alcohol / Substance Use: Not Applicable Psych Involvement: No (comment)  Admission diagnosis:  Sepsis due to urinary tract infection [A41.9, N39.0] Septic shock [A41.9, R65.21] Hypotension, unspecified hypotension type [I95.9] Patient Active Problem List   Diagnosis Date Noted   Septic shock 08/01/2022   Nephrolithiasis 08/01/2022   AKI (acute kidney injury) 08/01/2022   Sepsis due to urinary tract infection 08/01/2022   Cervical radiculopathy at C5 06/10/2022   Nontraumatic complete tear of right rotator cuff 06/09/2022   Cervical disc disorder at C5-C6 level with radiculopathy 06/09/2022   Right sided weakness 06/04/2022   Weakness of right upper extremity 06/04/2022   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   Chronic, continuous use of opioids 02/12/2021   Other spondylosis, lumbar region 02/12/2021   NSVT (nonsustained ventricular tachycardia) 10/12/2020   PAT (paroxysmal atrial tachycardia) 10/12/2020   History of fusion of cervical spine 09/04/2020   Lumbar  post-laminectomy syndrome 09/04/2020   Pain medication agreement 09/04/2020   Anxiety    Arthritis    Chronic back pain    Coronary artery disease    Depression    Diverticulitis    Dizziness    DJD (degenerative joint disease)    GERD (gastroesophageal reflux disease)    History of colon polyps    Hypertension    Hypothyroidism    IBS (irritable bowel syndrome)    Tuberculosis    Type II diabetes mellitus    Weakness    Abnormal stress test    Fatigue 09/06/2019   Preoperative cardiovascular examination 09/06/2019   Swelling of lower limb 06/20/2014   Claudication 06/20/2014   HNP (herniated nucleus pulposus), lumbar 06/14/2014   Displacement of lumbar intervertebral disc without myelopathy 05/30/2014   Atypical chest pain 12/28/2013   Unstable angina 12/27/2013   CAD in native artery 03/14/2013    Class: Chronic   Hyperlipidemia 03/14/2013   Atherosclerotic heart disease of native coronary artery with angina pectoris 03/14/2013   Chest pain 07/30/2011   Obesity, unspecified 07/30/2011   Essential hypertension, benign 07/30/2011   Diabetes mellitus type 2, controlled, with complications 07/30/2011   PCP:  Lonie Peak, PA-C Pharmacy:   [facility] [address] [place] - [address] [place] [address] Phone: [phone] Fax: [phone]  [id number] [place], [place] - [address] AT [facility] [place] [address] [place] [address] Phone: [phone] Fax: [phone]     Social Determinants of Health (SDOH) Social History: SDOH Screenings   Food Insecurity: No Food Insecurity (08/02/2022)  Housing: Low Risk  (08/02/2022)  Transportation Needs: No Transportation Needs (08/02/2022)  Utilities: Not At Risk (08/02/2022)  Tobacco Use: High Risk (08/02/2022)   SDOH Interventions:     Readmission Risk Interventions     No data to display

## 2022-08-04 NOTE — Progress Notes (Signed)
PROGRESS NOTE  [name]  DOB: February 13, 1946  PCP: Lonie Peak, PA-C ZOX:[id number]  DOA: 08/01/2022  LOS: 3 days  Hospital Day: 4  Brief narrative: [name] is a 77 y.o. male with PMH significant for DM2, HTN, HLD, CAD, paroxysmal atrial tachycardia, hypothyroidism, depression/anxiety. Patient was recently hospitalized 2/24 to 2/27 and was discharged to rehab where he stayed for about 6 weeks and was discharged on 4/17.  Patient was still weak and had a fall within 24 hours of living at home alone. 4/19, patient was brought to ED from home by EMS after a fall.  Patient initially refused to be transferred to the ED but he called back after several hours for generalized weakness and hence brought back to ED.  This presentation, he was noted to be in septic shock secondary to UTI Urinalysis showed cloudy yellow urine with large amount of leukocytes, hematuria Started on IV antibiotics.  Admitted to ICU on pressors Pressors were weaned off next day 4/20 Urine culture and blood culture grew Klebsiella pneumoniae.  Antibiotics streamlined 4/22, transferred out to [facility]  Subjective: Patient was seen and examined this morning.  Pleasant elderly Caucasian male.  Propped up on bed.  Not in distress.  Not on oxygen.  Hard of hearing. Sister [name] at bedside. Chart reviewed.  Afebrile, hemodynamically stable Last labs from this morning with hemoglobin at 8.2, WBC count normal Blood glucose level mostly controlled under 200  Assessment and plan: Septic shock secondary to UTI Klebsiella pneumonia bacteremia and UTI Initially admitted to ICU.  Hemodynamically stabilized now Currently on IV Rocephin WBC count and lactic acid level improved Continue to monitor Recent Labs  Lab 08/01/22 1558 08/01/22 1815 08/02/22 0159 08/02/22 0608 08/04/22 0448  WBC 21.7*  --  26.5*  --  8.8  LATICACIDVEN 2.1* 2.2* 2.0* 1.8  --   PROCALCITON  --   --  67.74  --   --    B/l renal  calculi History of nephrolithiasis  On admission, CT abd/pelvis showed 5 mm renal calculus within the dependent portion of the left renal pelvis and a 6 mm renal calculus within the dependent portion of a mildly dilated right renal pelvis.  Findings largely unchanged from previous scan.  Currently no evidence of obstruction. Continue outpatient follow-up with urology.   AKI Hyponatremia Creatinine and sodium level improved with IV hydration Recent Labs    06/04/22 0800 06/05/22 0510 06/06/22 0752 06/07/22 0429 06/08/22 0409 06/09/22 0730 06/10/22 0310 08/01/22 1558 08/02/22 0203 08/04/22 0448  BUN 29* 22 27* 26* 26* 24* CREATININE 0.80 0.88 0.78 0.78 0.75 0.70 0.71 1.29* 1.04 0.77   Recent Labs  Lab 08/01/22 1558 08/02/22 0203 08/04/22 0448  NA 132* 132* 137   Hypomagnesemia Magnesium level improved with replacement. Recent Labs  Lab 08/01/22 1558 08/02/22 0203 08/02/22 1939 08/04/22 0448  K 4.6 4.3  --  3.9  MG  --  1.1* 2.1  --    Acute anemia Hemoglobin was more than 11 in February.  Downtrending this admission.  No evidence of bleeding.  Could be dilutional.  Continue to Recent Labs    06/08/22 0409 06/10/22 0310 08/01/22 1558 08/02/22 0159 08/04/22 0448  HGB 13.1 11.8* 9.4* 8.9* 8.2*  MCV 89.1 90.4 93.8 96.6 91.0   Type 2 diabetes mellitus A1c 8.1 on 06/05/2022 PTA on metformin 500 mg twice daily, Premeal sliding scale insulin Currently on sliding scale insulin only. Recent Labs  Lab 08/03/22  1530 08/03/22 1926 08/03/22 2323 08/04/22 0320 08/04/22 0754  GLUCAP 168* 160* 174* 149* 202*   Essential hypertension PTA on Coreg 3.125 mg twice daily, amlodipine 10 mg daily, losartan 100 mg daily, Currently all antihypertensives on hold.  Blood pressure and heart rate are stable.   paroxysmal atrial tachycardia Currently in sinus rhythm with beta-blocker on hold  CAD/HLD Continue aspirin, Plavix, statin  Hypothyroidism Continue  Synthroid  Hepatic steatosis Continue statin  Depression/anxiety Continue sertraline   Multiple rotator cuff tears on right 2/25, MRI shoulder also showed multiple rotator cuff tears and bony Bankart fracture.  Orthopedics recommended conservative management Robaxin and tramadol as needed for pain control Follow-up with orthopedics as an outpatient   Chronic back pain  Lumbar postlaminectomy syndrome S/p underwent left L3 hemilaminectomy and microdiscectomy -06/14/14 Dr. Newell Coral. Patient has lumbar postlaminectomy syndrome since then  C-spine foraminal stenosis and radiculopathy He was recently hospitalized 2/24-2/27 for right upper extremity weakness and recurrent falls.  MRI at that time showed C5-C6 moderate to severe right foraminal stenosis.  Neurosurgery recommended conservative management.   Impaired mobility Frequent falls Secondary to multiple musculoskeletal and spinal issues as above PT eval obtained.  SNF recommended  Goals of care   Code Status: Full Code     DVT prophylaxis:  heparin injection 5,000 Units Start: 08/01/22 2245   Antimicrobials: IV Rocephin currently Fluid: None Consultants: PCCM Family Communication: Sister [name] at bedside  Status: Inpatient Level of care:  Telemetry Medical   Needs to continue in-hospital care:  Needs SNF placement  Patient from: Home Anticipated d/c to: SNF   Diet:  Diet Order             Diet heart healthy/carb modified Room service appropriate? Yes with Assist; Fluid consistency: Thin  Diet effective now                   Scheduled Meds:  aspirin EC  81 mg Oral Daily   atorvastatin  80 mg Oral Daily   Chlorhexidine Gluconate Cloth  6 each Topical Daily   clopidogrel  75 mg Oral Q breakfast   heparin  5,000 Units Subcutaneous Q8H   insulin aspart  0-9 Units Subcutaneous Q4H   levothyroxine  50 mcg Oral QAC breakfast   mupirocin ointment  1 Application Nasal BID   sertraline  100 mg Oral QHS     PRN meds: acetaminophen, docusate sodium, methocarbamol, ondansetron (ZOFRAN) IV, mouth rinse, polyethylene glycol, traMADol   Infusions:   sodium chloride Stopped (08/04/22 0031)   cefTRIAXone (ROCEPHIN)  IV Stopped (08/03/22 2131)   norepinephrine (LEVOPHED) Adult infusion Stopped (08/02/22 1621)    Antimicrobials: Anti-infectives (From admission, onward)    Start     Dose/Rate Route Frequency Ordered Stop   08/03/22 2200  cefTRIAXone (ROCEPHIN) 2 g in sodium chloride 0.9 % 100 mL IVPB        2 g 200 mL/hr over 30 Minutes Intravenous Every 24 hours 08/03/22 1404     08/02/22 1400  ceFEPIme (MAXIPIME) 2 g in sodium chloride 0.9 % 100 mL IVPB  Status:  Discontinued        2 g 200 mL/hr over 30 Minutes Intravenous Every 8 hours 08/02/22 1123 08/03/22 1404   08/01/22 1800  ceFEPIme (MAXIPIME) 2 g in sodium chloride 0.9 % 100 mL IVPB  Status:  Discontinued        2 g 200 mL/hr over 30 Minutes Intravenous  Once 08/01/22 1748 08/01/22 1750   08/01/22  1800  metroNIDAZOLE (FLAGYL) IVPB 500 mg        500 mg 100 mL/hr over 60 Minutes Intravenous  Once 08/01/22 1748 08/01/22 1937   08/01/22 1800  ceFEPIme (MAXIPIME) 2 g in sodium chloride 0.9 % 100 mL IVPB  Status:  Discontinued        2 g 200 mL/hr over 30 Minutes Intravenous Every 12 hours 08/01/22 1750 08/02/22 1123       Nutritional status:  Body mass index is 35.71 kg/m.          Objective: Vitals:   08/04/22 0800 08/04/22 0900  BP: (!) 126/58 (!) 119/50  Pulse: 88 74  Resp: 17 16  Temp: 99 F (37.2 C)   SpO2: 99% 99%    Intake/Output Summary (Last 24 hours) at 08/04/2022 0925 Last data filed at 08/04/2022 0900 Gross per 24 hour  Intake 858.36 ml  Output 2500 ml  Net -1641.64 ml   Filed Weights   08/02/22 0100 08/03/22 0411 08/04/22 0500  Weight: 109.7 kg 109.7 kg 109.7 kg   Weight change: 0 kg Body mass index is 35.71 kg/m.   Physical Exam: General exam: Pleasant, elderly Caucasian male.  Not in  pain currently Skin: No rashes, lesions or ulcers. HEENT: Atraumatic, normocephalic, no obvious bleeding Lungs: Clear to auscultate bilaterally CVS: Regular rate and rhythm, normal GI/Abd soft, nontender, nondistended, bowel sound present CNS: Alert, awake, hard of hearing but oriented to place and person Psychiatry: Sad affect Extremities: No pedal edema, no calf redness  Data Review: I have personally reviewed the laboratory data and studies available.  F/u labs ordered Unresulted Labs (From admission, onward)    None       Total time spent in review of labs and imaging, patient evaluation, formulation of plan, documentation and communication with family: 55 minutes  Signed, Lorin Glass, MD [facility] 08/04/2022

## 2022-08-05 DIAGNOSIS — A419 Sepsis, unspecified organism: Secondary | ICD-10-CM | POA: Diagnosis not present

## 2022-08-05 DIAGNOSIS — R6521 Severe sepsis with septic shock: Secondary | ICD-10-CM | POA: Diagnosis not present

## 2022-08-05 LAB — GLUCOSE, CAPILLARY
Glucose-Capillary: 144 mg/dL — ABNORMAL HIGH (ref 70–99)
Glucose-Capillary: 155 mg/dL — ABNORMAL HIGH (ref 70–99)
Glucose-Capillary: 135 mg/dL — ABNORMAL HIGH (ref 70–99)
Glucose-Capillary: 163 mg/dL — ABNORMAL HIGH (ref 70–99)

## 2022-08-05 LAB — CULTURE, BLOOD (ROUTINE X 2): Special Requests: ADEQUATE

## 2022-08-05 MED ORDER — CARVEDILOL 3.125 MG PO TABS
3.1250 mg | ORAL_TABLET | Freq: Two times a day (BID) | ORAL | Status: DC
  Administered 2022-08-05 – 2022-08-07 (×4): 3.125 mg via ORAL
  Filled 2022-08-05 (×4): qty 1

## 2022-08-05 MED ORDER — AMOXICILLIN-POT CLAVULANATE 875-125 MG PO TABS
1.0000 | ORAL_TABLET | Freq: Two times a day (BID) | ORAL | Status: DC
  Administered 2022-08-05 – 2022-08-07 (×4): 1 via ORAL
  Filled 2022-08-05 (×4): qty 1

## 2022-08-05 NOTE — Progress Notes (Signed)
Patient transferred to 2W. Patient stated he would notify his family in the morning via his cell phone of his new room.

## 2022-08-05 NOTE — Care Management Important Message (Signed)
Important Message  Patient Details  Name: [name] MRN: [id number] Date of Birth: 07-02-1945   Medicare Important Message Given:  Yes     [name] [name] 08/05/2022, 12:05 PM

## 2022-08-05 NOTE — Progress Notes (Signed)
Physical Therapy Treatment Patient Details Name: [name] MRN: [id number] DOB: [birth year] Today's Date: 08/05/2022   History of Present Illness [name] is a 77 y.o. male with PMH significant for DM2, HTN, HLD, CAD, paroxysmal atrial tachycardia, hypothyroidism, depression/anxiety.  Patient was recently hospitalized 2/24 to 2/27 and was discharged to rehab where he stayed for about 6 weeks and was discharged on 4/17.  Patient was still weak and had a fall within 24 hours of living at home alone.This presentation, he was noted to be in septic shock secondary to UTI initially admitted to ICU, transferred to the floor 4/22.    PT Comments    Pt admitted with above diagnosis. Pt was able to ambulate a few feet with min assist of 2 with RW with cues for safety. Pt with progression due to less right LE pain.  Discussed d/c with pt as PT and OT were trying to demonstrate to pt all aspects that he would have to do when he is alone at home. Pt agrees that a few more weeks at SNF for therapy would incr his independence as he still struggles with sit to stand and transfers on his own.  Pt currently with functional limitations due to the deficits listed below (see PT Problem List). Pt will benefit from acute skilled PT to increase their independence and safety with mobility to allow discharge.      Recommendations for follow up therapy are one component of a multi-disciplinary discharge planning process, led by the attending physician.  Recommendations may be updated based on patient status, additional functional criteria and insurance authorization.  Follow Up Recommendations  Can patient physically be transported by private vehicle: No    Assistance Recommended at Discharge Frequent or constant Supervision/Assistance  Patient can return home with the following Two people to help with walking and/or transfers;A lot of help with bathing/dressing/bathroom;Direct supervision/assist for medications  management;Direct supervision/assist for financial management;Help with stairs or ramp for entrance;Assist for transportation;Assistance with cooking/housework   Equipment Recommendations  None recommended by PT    Recommendations for Other Services       Precautions / Restrictions Precautions Precautions: Fall Precaution Comments: h/o multiple falls Restrictions Weight Bearing Restrictions: No Other Position/Activity Restrictions: R RCT with limited shoulder ROM     Mobility  Bed Mobility Overal bed mobility: Needs Assistance Bed Mobility: Supine to Sit     Supine to sit: Min assist     General bed mobility comments: min to progress trunk, HOB elevated. Pt reports at home he has bed rails to assist    Transfers Overall transfer level: Needs assistance Equipment used: Rolling walker (2 wheels) Transfers: Sit to/from Stand, Bed to chair/wheelchair/BSC Sit to Stand: Mod assist Stand pivot transfers: Min assist         General transfer comment: modA to powerup into standing from lower surface. pt progressing to stand from elevated surface with cues for safe hand placement and minguard for safety and stability.    Ambulation/Gait Ambulation/Gait assistance: Min assist, +2 safety/equipment Gait Distance (Feet): 8 Feet Assistive device: Rolling walker (2 wheels) Gait Pattern/deviations: Step-through pattern, Decreased stride length, Narrow base of support, Trunk flexed   Gait velocity interpretation: <1.31 ft/sec, indicative of household ambulator   General Gait Details: minA for ambulation, +2 assist for safety and chair follow. No knee buckling noted this session, pt reports pain in R knee, but states it has improved today.  Needed cues for safety with use of RW as well.  Stairs             Wheelchair Mobility    Modified Rankin (Stroke Patients Only)       Balance Overall balance assessment: Needs assistance Sitting-balance support: Feet  supported Sitting balance-Leahy Scale: Good     Standing balance support: Reliant on assistive device for balance, Bilateral upper extremity supported, During functional activity Standing balance-Leahy Scale: Poor Standing balance comment: reliant on UE support in standing                            Cognition Arousal/Alertness: Awake/alert Behavior During Therapy: WFL for tasks assessed/performed Overall Cognitive Status: Within Functional Limits for tasks assessed                                 General Comments: pt with decreased safety awareness at times, consistent cues for safe hand placement difficult to differentiate cognition vs HOH and cues/prompts        Exercises General Exercises - Upper Extremity Shoulder Flexion: AROM, Right, 10 reps, Seated, AAROM Elbow Flexion: AROM, 10 reps, Seated General Exercises - Lower Extremity Ankle Circles/Pumps: AROM, Both, Supine, 15 reps Quad Sets: Both, 10 reps, Seated Gluteal Sets: AROM, Both, 10 reps, Supine Long Arc Quad: AROM, Both, 10 reps, Seated Hip Flexion/Marching: Both, 10 reps, Seated    General Comments General comments (skin integrity, edema, etc.): VSS on RA      Pertinent Vitals/Pain Pain Assessment Pain Assessment: Faces Faces Pain Scale: Hurts a little bit Pain Location: R shoulder with AROM Pain Descriptors / Indicators: Sore, Guarding, Grimacing Pain Intervention(s): Limited activity within patient's tolerance, Monitored during session, Repositioned    Home Living Family/patient expects to be discharged to:: Private residence Living Arrangements: Alone Available Help at Discharge: Personal care attendant Type of Home: Apartment Home Access: Level entry       Home Layout: One level Home Equipment: Rollator (4 wheels);Grab bars - tub/shower;Electric scooter;Grab bars - toilet;Other (comment) Additional Comments: pt had been home from SNF only one day. Caregivers to come in  morning and evening for a few hours    Prior Function            PT Goals (current goals can now be found in the care plan section) Acute Rehab PT Goals Patient Stated Goal: return home, pt adamant that he does not want to go back to SNF Progress towards PT goals: Progressing toward goals    Frequency    Min 3X/week      PT Plan Current plan remains appropriate    Co-evaluation PT/OT/SLP Co-Evaluation/Treatment: Yes Reason for Co-Treatment: For patient/therapist safety PT goals addressed during session: Mobility/safety with mobility OT goals addressed during session: ADL's and self-care      AM-PAC PT "6 Clicks" Mobility   Outcome Measure  Help needed turning from your back to your side while in a flat bed without using bedrails?: A Little Help needed moving from lying on your back to sitting on the side of a flat bed without using bedrails?: A Little Help needed moving to and from a bed to a chair (including a wheelchair)?: A Lot Help needed standing up from a chair using your arms (e.g., wheelchair or bedside chair)?: A Lot Help needed to walk in hospital room?: Total Help needed climbing 3-5 steps with a railing? : Total 6 Click Score: 12    End of  Session Equipment Utilized During Treatment: Gait belt Activity Tolerance: Patient limited by pain;Patient limited by fatigue Patient left: with call bell/phone within reach;in chair;with chair alarm set Nurse Communication: Mobility status PT Visit Diagnosis: Muscle weakness (generalized) (M62.81);History of falling (Z91.81);Repeated falls (R29.6);Difficulty in walking, not elsewhere classified (R26.2);Pain Pain - Right/Left: Right Pain - part of body: Leg     Time: 4540-9811 PT Time Calculation (min) (ACUTE ONLY): 28 min  Charges:  $Gait Training: 8-22 mins                     Barack Nicodemus M,PT Acute Rehab Services [phone]    Bevelyn Buckles 08/05/2022, 11:30 AM

## 2022-08-05 NOTE — Progress Notes (Signed)
PROGRESS NOTE  Kaylyn Lim Cena  DOB: Apr 16, 1945  PCP: Lonie Peak, PA-C WUJ:[id number]  DOA: 08/01/2022  LOS: 4 days  Hospital Day: 5  Brief narrative: SALOMON GANSER is a 77 y.o. male with PMH significant for DM2, HTN, HLD, CAD, paroxysmal atrial tachycardia, hypothyroidism, depression/anxiety. Patient was recently hospitalized 2/24 to 2/27 and was discharged to rehab where he stayed for about 6 weeks and was discharged on 4/17.  Patient was still weak and had a fall within 24 hours of living at home alone. 4/19, patient was brought to ED from home by EMS after a fall.  Patient initially refused to be transferred to the ED but he called back after several hours for generalized weakness and hence brought back to ED.  This presentation, he was noted to be in septic shock secondary to UTI Urinalysis showed cloudy yellow urine with large amount of leukocytes, hematuria Started on IV antibiotics.  Admitted to ICU on pressors Pressors were weaned off next day 4/20 Urine culture and blood culture grew Klebsiella pneumoniae.  Antibiotics streamlined 4/22, transferred out to [facility]  Subjective: Patient was seen and examined this morning.   Sitting up in recliner.  Not in distress.  No new symptoms.  Blood pressure elevated this morning.  Coreg dose increased.  Pending SNF  Assessment and plan: Septic shock secondary to UTI Klebsiella pneumonia bacteremia and UTI Initially admitted to ICU.  Hemodynamically stabilized now Currently on IV Rocephin WBC count and lactic acid level improved Discussed with pharmacy.  Switch to oral Augmentin today.  Plan for total of 10 days of antibiotics Continue to monitor Recent Labs  Lab 08/01/22 1558 08/01/22 1815 08/02/22 0159 08/02/22 0608 08/04/22 0448  WBC 21.7*  --  26.5*  --  8.8  LATICACIDVEN 2.1* 2.2* 2.0* 1.8  --   PROCALCITON  --   --  67.74  --   --    B/l renal calculi History of nephrolithiasis  On admission, CT abd/pelvis showed 5 mm  renal calculus within the dependent portion of the left renal pelvis and a 6 mm renal calculus within the dependent portion of a mildly dilated right renal pelvis.  Findings largely unchanged from previous scan.  Currently no evidence of obstruction. Continue outpatient follow-up with urology.   AKI Hyponatremia Creatinine and sodium level improved with IV hydration Recent Labs    06/04/22 0800 06/05/22 0510 06/06/22 0752 06/07/22 0429 06/08/22 0409 06/09/22 0730 06/10/22 0310 08/01/22 1558 08/02/22 0203 08/04/22 0448  BUN 29* 22 27* 26* 26* 24* 23 20 20 12   CREATININE 0.80 0.88 0.78 0.78 0.75 0.70 0.71 1.29* 1.04 0.77   Recent Labs  Lab 08/01/22 1558 08/02/22 0203 08/04/22 0448  NA 132* 132* 137   Hypomagnesemia Magnesium level improved with replacement. Recent Labs  Lab 08/01/22 1558 08/02/22 0203 08/02/22 1939 08/04/22 0448  K 4.6 4.3  --  3.9  MG  --  1.1* 2.1  --    Acute anemia Hemoglobin was more than 11 in February.  Downtrending this admission.  No evidence of bleeding.  Could be dilutional.  Continue to Recent Labs    06/08/22 0409 06/10/22 0310 08/01/22 1558 08/02/22 0159 08/04/22 0448  HGB 13.1 11.8* 9.4* 8.9* 8.2*  MCV 89.1 90.4 93.8 96.6 91.0   Type 2 diabetes mellitus A1c 8.1 on 06/05/2022 PTA on metformin 500 mg twice daily, Premeal sliding scale insulin Currently on sliding scale insulin only. Recent Labs  Lab 08/04/22 1609 08/04/22 1925 08/04/22 2337 08/05/22  1610 08/05/22 1144  GLUCAP 154* 200* 179* 144* 155*   Essential hypertension PTA on Coreg 3.125 mg twice daily, amlodipine 10 mg daily, losartan 100 mg daily, Blood pressure elevated.  Resume Coreg at 3.125 mg twice daily this morning.  Continue to hold amlodipine and losartan.  Continue IV hydralazine as needed.   paroxysmal atrial tachycardia Currently in sinus rhythm with beta-blocker on hold  CAD/HLD Continue aspirin, Plavix, statin  Hypothyroidism Continue  Synthroid  Hepatic steatosis Continue statin  Depression/anxiety Continue sertraline   Multiple rotator cuff tears on right 2/25, MRI shoulder also showed multiple rotator cuff tears and bony Bankart fracture.  Orthopedics recommended conservative management Robaxin and tramadol as needed for pain control Follow-up with orthopedics as an outpatient   Chronic back pain  Lumbar postlaminectomy syndrome S/p underwent left L3 hemilaminectomy and microdiscectomy -06/14/14 Dr. Newell Coral. Patient has lumbar postlaminectomy syndrome since then  C-spine foraminal stenosis and radiculopathy He was recently hospitalized 2/24-2/27 for right upper extremity weakness and recurrent falls.  MRI at that time showed C5-C6 moderate to severe right foraminal stenosis.  Neurosurgery recommended conservative management.   Impaired mobility Frequent falls Secondary to multiple musculoskeletal and spinal issues as above PT eval obtained.  SNF recommended  Goals of care   Code Status: Full Code     DVT prophylaxis:  heparin injection 5,000 Units Start: 08/01/22 2245   Antimicrobials: IV Rocephin currently Fluid: None Consultants: PCCM Family Communication: Sister Junious Dresser at bedside  Status: Inpatient Level of care:  Telemetry Medical   Needs to continue in-hospital care:  Needs SNF placement  Patient from: Home Anticipated d/c to: SNF pending   Diet:  Diet Order             Diet heart healthy/carb modified Room service appropriate? Yes with Assist; Fluid consistency: Thin  Diet effective now                   Scheduled Meds:  amoxicillin-clavulanate  1 tablet Oral Q12H   aspirin EC  81 mg Oral Daily   atorvastatin  80 mg Oral Daily   carvedilol  3.125 mg Oral BID WC   Chlorhexidine Gluconate Cloth  6 each Topical Daily   clopidogrel  75 mg Oral Q breakfast   heparin  5,000 Units Subcutaneous Q8H   insulin aspart  0-9 Units Subcutaneous Q4H   levothyroxine  50 mcg Oral QAC  breakfast   mupirocin ointment  1 Application Nasal BID   sertraline  100 mg Oral QHS    PRN meds: acetaminophen, docusate sodium, methocarbamol, ondansetron (ZOFRAN) IV, mouth rinse, polyethylene glycol, traMADol   Infusions:   sodium chloride Stopped (08/04/22 0031)    Antimicrobials: Anti-infectives (From admission, onward)    Start     Dose/Rate Route Frequency Ordered Stop   08/05/22 1800  amoxicillin-clavulanate (AUGMENTIN) 875-125 MG per tablet 1 tablet        1 tablet Oral Every 12 hours 08/05/22 0910 08/09/22 0959   08/03/22 2200  cefTRIAXone (ROCEPHIN) 2 g in sodium chloride 0.9 % 100 mL IVPB  Status:  Discontinued        2 g 200 mL/hr over 30 Minutes Intravenous Every 24 hours 08/03/22 1404 08/05/22 0910   08/02/22 1400  ceFEPIme (MAXIPIME) 2 g in sodium chloride 0.9 % 100 mL IVPB  Status:  Discontinued        2 g 200 mL/hr over 30 Minutes Intravenous Every 8 hours 08/02/22 1123 08/03/22 1404   08/01/22 1800  ceFEPIme (MAXIPIME) 2 g in sodium chloride 0.9 % 100 mL IVPB  Status:  Discontinued        2 g 200 mL/hr over 30 Minutes Intravenous  Once 08/01/22 1748 08/01/22 1750   08/01/22 1800  metroNIDAZOLE (FLAGYL) IVPB 500 mg        500 mg 100 mL/hr over 60 Minutes Intravenous  Once 08/01/22 1748 08/01/22 1937   08/01/22 1800  ceFEPIme (MAXIPIME) 2 g in sodium chloride 0.9 % 100 mL IVPB  Status:  Discontinued        2 g 200 mL/hr over 30 Minutes Intravenous Every 12 hours 08/01/22 1750 08/02/22 1123       Nutritional status:  Body mass index is 36.01 kg/m.          Objective: Vitals:   08/05/22 0807 08/05/22 1210  BP: (!) 155/74 136/72  Pulse: 77 90  Resp: 17 17  Temp: 97.9 F (36.6 C) 98.5 F (36.9 C)  SpO2: 98% 100%    Intake/Output Summary (Last 24 hours) at 08/05/2022 1248 Last data filed at 08/05/2022 1018 Gross per 24 hour  Intake 820 ml  Output 3650 ml  Net -2830 ml   Filed Weights   08/03/22 0411 08/04/22 0500 08/05/22 0416  Weight:  109.7 kg 109.7 kg 110.6 kg   Weight change: 0.9 kg Body mass index is 36.01 kg/m.   Physical Exam: General exam: Pleasant, elderly Caucasian male.  Not in pain currently Skin: No rashes, lesions or ulcers. HEENT: Atraumatic, normocephalic, no obvious bleeding Lungs: Clear to auscultation bilaterally CVS: Regular rate and rhythm, normal GI/Abd soft, nontender, nondistended, bowel sound present CNS: Alert, awake, hard of hearing but oriented to place and person Psychiatry: Sad affect Extremities: No pedal edema, no calf redness  Data Review: I have personally reviewed the laboratory data and studies available.  F/u labs ordered Unresulted Labs (From admission, onward)    None       Total time spent in review of labs and imaging, patient evaluation, formulation of plan, documentation and communication with family: 45 minutes  Signed, Lorin Glass, MD [facility] 08/05/2022

## 2022-08-05 NOTE — Progress Notes (Signed)
Occupational Therapy Treatment Patient Details Name: [name] MRN: [id number] DOB: 03-24-1946 Today's Date: 08/05/2022   History of present illness CHARISTOPHER RUMBLE is a 77 y.o. male with PMH significant for DM2, HTN, HLD, CAD, paroxysmal atrial tachycardia, hypothyroidism, depression/anxiety.  Patient was recently hospitalized 2/24 to 2/27 and was discharged to rehab where he stayed for about 6 weeks and was discharged on 4/17.  Patient was still weak and had a fall within 24 hours of living at home alone.This presentation, he was noted to be in septic shock secondary to UTI initially admitted to ICU, transferred to the floor 4/22.   OT comments  Pt progressing toward established OT goals. Pt currently requires minA for bed mobility. He requires maxA for LB dressing. Pt requires modA to powerup into standing from lower surface, with verbal cues and after several STS he was able to progress to stand from elevated surface at minguard level. Pt ambulated in room ~37feet with minA+2 for safety. Pt completed RUE ROM exercises while seated in recliner. Patient will benefit from continued inpatient follow up therapy, <3 hours/day. Pt more open to short term follow-up therapy prior to d/c home. Will continue to follow acutely and progress appropriately.    Recommendations for follow up therapy are one component of a multi-disciplinary discharge planning process, led by the attending physician.  Recommendations may be updated based on patient status, additional functional criteria and insurance authorization.    Assistance Recommended at Discharge Frequent or constant Supervision/Assistance  Patient can return home with the following  A lot of help with walking and/or transfers;A lot of help with bathing/dressing/bathroom;Assist for transportation;Assistance with cooking/housework   Equipment Recommendations  Wheelchair (measurements OT);Wheelchair cushion (measurements OT)    Recommendations for  Other Services      Precautions / Restrictions Precautions Precautions: Fall Precaution Comments: h/o multiple falls Restrictions Weight Bearing Restrictions: No Other Position/Activity Restrictions: R RCT with limited shoulder ROM       Mobility Bed Mobility Overal bed mobility: Needs Assistance Bed Mobility: Supine to Sit     Supine to sit: Min assist     General bed mobility comments: min to progress trunk, HOB elevated. Pt reports at home he has bed rails to assist    Transfers Overall transfer level: Needs assistance Equipment used: Rolling walker (2 wheels) Transfers: Sit to/from Stand, Bed to chair/wheelchair/BSC Sit to Stand: Mod assist Stand pivot transfers: Min assist         General transfer comment: modA to powerup into standing from lower surface. pt progressing to stand from elevated surface with cuse for safe hand placement and minguard for safety and stability. minA for ambulation, +2 assist for safety and chair follow. No knee buckling noted this session, pt reports pain in R knee, but states it has improved today.     Balance Overall balance assessment: Needs assistance Sitting-balance support: Feet supported Sitting balance-Leahy Scale: Good     Standing balance support: Reliant on assistive device for balance Standing balance-Leahy Scale: Fair Standing balance comment: reliant on UE support in standing                           ADL either performed or assessed with clinical judgement   ADL Overall ADL's : Needs assistance/impaired Eating/Feeding: Set up;Sitting   Grooming: Set up;Sitting Grooming Details (indicate cue type and reason): did not assess functional standing at sink level this date, anticipate pt will likely require minA for  stability and safety             Lower Body Dressing: Maximal assistance;Sit to/from stand Lower Body Dressing Details (indicate cue type and reason): maxA to access feed Toilet Transfer:  Minimal assistance;Rolling walker (2 wheels);Moderate assistance Toilet Transfer Details (indicate cue type and reason): modA for powerup into standing from elevated surface pt would require minA for ambulation Toileting- Clothing Manipulation and Hygiene: Total assistance       Functional mobility during ADLs: Moderate assistance;+2 for physical assistance;+2 for safety/equipment;Rolling walker (2 wheels) General ADL Comments: pt improved with functional mobility over time. initially requiring modA to powerup into standing from lower surface. Progressing to minA sit<>stand from elevated surface    Extremity/Trunk Assessment Upper Extremity Assessment Upper Extremity Assessment: RUE deficits/detail RUE Deficits / Details: minimal use of RUE due to RTC tears and cervical stenosis. Pt is R handed   Lower Extremity Assessment Lower Extremity Assessment: Defer to PT evaluation RLE Deficits / Details: hip flex 3-/5, knee ext 3/5 but pt with increased pain R lateral thigh with sit>stand and R knee buckling when attempting to step LLE. Xray of R femur done after eval, it was neg for fx. Pt did fall on R side PTA. RLE Sensation: WNL RLE Coordination: decreased gross motor   Cervical / Trunk Assessment Cervical / Trunk Assessment: Kyphotic    Vision Patient Visual Report: No change from baseline     Perception     Praxis      Cognition Arousal/Alertness: Awake/alert Behavior During Therapy: WFL for tasks assessed/performed Overall Cognitive Status: Within Functional Limits for tasks assessed                                 General Comments: pt with decreased safety awareness at times, consistent cues for safe hand placement difficult to differentiate cognition vs HOH and cues/prompts        Exercises Exercises: General Upper Extremity, General Lower Extremity General Exercises - Upper Extremity Shoulder Flexion: AROM, Right, 10 reps, Seated, AAROM Elbow Flexion: AROM,  10 reps, Seated General Exercises - Lower Extremity Quad Sets: Both, 10 reps, Seated Hip Flexion/Marching: Both, 10 reps, Seated    Shoulder Instructions       General Comments vss    Pertinent Vitals/ Pain       Pain Assessment Pain Assessment: Faces Faces Pain Scale: Hurts a little bit Pain Location: R shoulder with AROM Pain Descriptors / Indicators: Sore, Guarding, Grimacing Pain Intervention(s): Limited activity within patient's tolerance, Monitored during session  Home Living Family/patient expects to be discharged to:: Private residence Living Arrangements: Alone Available Help at Discharge: Personal care attendant Type of Home: Apartment Home Access: Level entry     Home Layout: One level     Bathroom Shower/Tub: Chief Strategy Officer: Standard     Home Equipment: Rollator (4 wheels);Grab bars - tub/shower;Electric scooter;Grab bars - toilet;Other (comment)   Additional Comments: pt had been home from SNF only one day. Caregivers to come in morning and evening for a few hours      Prior Functioning/Environment              Frequency  Min 2X/week        Progress Toward Goals  OT Goals(current goals can now be found in the care plan section)  Progress towards OT goals: Progressing toward goals  Acute Rehab OT Goals Patient Stated Goal: to go  home OT Goal Formulation: With patient Time For Goal Achievement: 08/15/22 Potential to Achieve Goals: Fair ADL Goals Pt Will Perform Grooming: with supervision;standing Pt Will Perform Upper Body Dressing: with set-up;sitting Pt Will Perform Lower Body Dressing: with min guard assist;sit to/from stand Pt Will Transfer to Toilet: with min guard assist;stand pivot transfer;regular height toilet;bedside commode Pt Will Perform Toileting - Clothing Manipulation and hygiene: with supervision;sitting/lateral leans Pt/caregiver will Perform Home Exercise Program: Increased ROM;Both right and left  upper extremity  Plan Discharge plan remains appropriate;Frequency remains appropriate    Co-evaluation    PT/OT/SLP Co-Evaluation/Treatment: Yes Reason for Co-Treatment: For patient/therapist safety;To address functional/ADL transfers   OT goals addressed during session: ADL's and self-care      AM-PAC OT "6 Clicks" Daily Activity     Outcome Measure   Help from another person eating meals?: None Help from another person taking care of personal grooming?: A Little Help from another person toileting, which includes using toliet, bedpan, or urinal?: A Lot Help from another person bathing (including washing, rinsing, drying)?: A Lot Help from another person to put on and taking off regular upper body clothing?: A Little Help from another person to put on and taking off regular lower body clothing?: A Lot 6 Click Score: 16    End of Session Equipment Utilized During Treatment: Gait belt;Rolling walker (2 wheels)  OT Visit Diagnosis: Unsteadiness on feet (R26.81);Repeated falls (R29.6);Muscle weakness (generalized) (M62.81);Pain Pain - Right/Left: Right Pain - part of body: Leg   Activity Tolerance Patient tolerated treatment well   Patient Left in chair;with call bell/phone within reach;with chair alarm set   Nurse Communication Mobility status        Time: 0981-1914 OT Time Calculation (min): 30 min  Charges: OT General Charges $OT Visit: 1 Visit OT Treatments $Self Care/Home Management : 8-22 mins  Rosey Bath OTR/L Acute Rehabilitation Services Office: [phone]   Rebeca Alert 08/05/2022, 11:18 AM

## 2022-08-06 DIAGNOSIS — R6521 Severe sepsis with septic shock: Secondary | ICD-10-CM | POA: Diagnosis not present

## 2022-08-06 DIAGNOSIS — A419 Sepsis, unspecified organism: Secondary | ICD-10-CM | POA: Diagnosis not present

## 2022-08-06 LAB — GLUCOSE, CAPILLARY
Glucose-Capillary: 141 mg/dL — ABNORMAL HIGH (ref 70–99)
Glucose-Capillary: 134 mg/dL — ABNORMAL HIGH (ref 70–99)
Glucose-Capillary: 211 mg/dL — ABNORMAL HIGH (ref 70–99)
Glucose-Capillary: 171 mg/dL — ABNORMAL HIGH (ref 70–99)
Glucose-Capillary: 158 mg/dL — ABNORMAL HIGH (ref 70–99)
Glucose-Capillary: 152 mg/dL — ABNORMAL HIGH (ref 70–99)

## 2022-08-06 LAB — CULTURE, BLOOD (ROUTINE X 2): Culture: NO GROWTH

## 2022-08-06 MED ORDER — AMLODIPINE BESYLATE 5 MG PO TABS
5.0000 mg | ORAL_TABLET | Freq: Every day | ORAL | Status: DC
  Administered 2022-08-06 – 2022-08-07 (×2): 5 mg via ORAL
  Filled 2022-08-06 (×2): qty 1

## 2022-08-06 NOTE — TOC Progression Note (Signed)
Transition of Care [facility]) - Progression Note    Patient Details  Name: SAATVIK THIELMAN MRN: [id number] Date of Birth: 09/19/1945  [facility] CM/SW Contact  Victorya Hillman A [place], [place] Phone Number: 08/06/2022, 4:29 PM  Clinical Narrative:    CSW was contacted by French Ana at [facility].  Pt was extended a bed offer.  CSW informed pt and pt's family, who was already made aware. Facility able to accept pt tomorrow.  Insurance authorization started. Authorization was approved. Reference ID: [id number]  TOC will continue to follow.   Expected Discharge Plan: Skilled Nursing Facility Barriers to Discharge: Continued Medical Work up, English as a second language teacher, SNF Pending bed offer  Expected Discharge Plan and Services In-house Referral: Clinical Social Work   Post Acute Care Choice: Skilled Nursing Facility Living arrangements for the past 2 months: Single Family Home, Skilled Nursing Facility                                       Social Determinants of Health (SDOH) Interventions SDOH Screenings   Food Insecurity: No Food Insecurity (08/02/2022)  Housing: Low Risk  (08/02/2022)  Transportation Needs: No Transportation Needs (08/02/2022)  Utilities: Not At Risk (08/02/2022)  Tobacco Use: High Risk (08/02/2022)    Readmission Risk Interventions     No data to display

## 2022-08-06 NOTE — Progress Notes (Signed)
Physical Therapy Treatment Patient Details Name: [name] MRN: [id number] DOB: [birth year] Today's Date: 08/06/2022   History of Present Illness [name] is a 77 y.o. male with PMH significant for DM2, HTN, HLD, CAD, paroxysmal atrial tachycardia, hypothyroidism, depression/anxiety.  Patient was recently hospitalized 2/24 to 2/27 and was discharged to rehab where he stayed for about 6 weeks and was discharged on 4/17.  Patient was still weak and had a fall within 24 hours of living at home alone.This presentation, he was noted to be in septic shock secondary to UTI initially admitted to ICU, transferred to the floor 4/22.    PT Comments    Pt is up to stand at bedside, was able to steady with help and then  sidesteps with assist on walker.  Pt is somewhat self directing and directly requires recues for instructions specific to safety.  Noted his HR was as high as 150 briefly with walking and then down to 95 with rest, and sats dropped both walking and standing to 84 and 88% respectively.  Recovery was faster with pursed lip breathing, which pt can do with a short delay to take on the instructions.  Follow along with pt to reinforce his safety information, to increase mobility as tolerated and to progress as needed to prepare for post acute care rehab.  Pt is definitely motivated to work and get home again.  Recommendations for follow up therapy are one component of a multi-disciplinary discharge planning process, led by the attending physician.  Recommendations may be updated based on patient status, additional functional criteria and insurance authorization.  Follow Up Recommendations  Can patient physically be transported by private vehicle: No    Assistance Recommended at Discharge Frequent or constant Supervision/Assistance  Patient can return home with the following Two people to help with walking and/or transfers;A lot of help with bathing/dressing/bathroom;Direct  supervision/assist for medications management;Direct supervision/assist for financial management;Help with stairs or ramp for entrance;Assist for transportation;Assistance with cooking/housework   Equipment Recommendations  None recommended by PT    Recommendations for Other Services       Precautions / Restrictions Precautions Precautions: Fall Precaution Comments: h/o multiple falls Restrictions Weight Bearing Restrictions: No Other Position/Activity Restrictions: R RCT with limited shoulder ROM     Mobility  Bed Mobility Overal bed mobility: Needs Assistance Bed Mobility: Supine to Sit     Supine to sit: Min assist     General bed mobility comments: pt is able to assist once set but requires redirection on hand placement often    Transfers Overall transfer level: Needs assistance Equipment used: Rolling walker (2 wheels) Transfers: Sit to/from Stand, Bed to chair/wheelchair/BSC Sit to Stand: Mod assist Stand pivot transfers: Min assist         General transfer comment: mod to get up on his feet but min for balance to step to chair, good follow through once standign    Ambulation/Gait Ambulation/Gait assistance: Min assist Gait Distance (Feet): 12 Feet Assistive device: Rolling walker (2 wheels) Gait Pattern/deviations: Decreased stride length, Narrow base of support, Trunk flexed, Step-to pattern Gait velocity: reduced Gait velocity interpretation: <1.31 ft/sec, indicative of household ambulator Pre-gait activities: standing balance ck General Gait Details: sidestepped on side of bed with one assist, no insecurity of knees   Stairs             Wheelchair Mobility    Modified Rankin (Stroke Patients Only)       Balance Overall balance assessment: Needs assistance  Sitting-balance support: Feet supported Sitting balance-Leahy Scale: Good     Standing balance support: Bilateral upper extremity supported, During functional activity Standing  balance-Leahy Scale: Poor Standing balance comment: fair on walker statically                            Cognition Arousal/Alertness: Awake/alert Behavior During Therapy: WFL for tasks assessed/performed Overall Cognitive Status: Within Functional Limits for tasks assessed                                 General Comments: reduced ability to self recognize when he is attempting unsafe movement        Exercises      General Comments General comments (skin integrity, edema, etc.): pt had issues of HR and sats with mobility, on portable monitor      Pertinent Vitals/Pain Pain Assessment Pain Assessment: Faces Faces Pain Scale: Hurts a little bit Pain Location: R shoulder with any use Pain Descriptors / Indicators: Grimacing, Guarding Pain Intervention(s): Limited activity within patient's tolerance, Monitored during session, Repositioned    Home Living                          Prior Function            PT Goals (current goals can now be found in the care plan section) Acute Rehab PT Goals Patient Stated Goal: to go home, not to SNF Progress towards PT goals: Progressing toward goals    Frequency    Min 3X/week      PT Plan Current plan remains appropriate    Co-evaluation              AM-PAC PT "6 Clicks" Mobility   Outcome Measure  Help needed turning from your back to your side while in a flat bed without using bedrails?: A Little Help needed moving from lying on your back to sitting on the side of a flat bed without using bedrails?: A Little Help needed moving to and from a bed to a chair (including a wheelchair)?: A Lot Help needed standing up from a chair using your arms (e.g., wheelchair or bedside chair)?: A Lot Help needed to walk in hospital room?: A Lot Help needed climbing 3-5 steps with a railing? : Total 6 Click Score: 13    End of Session Equipment Utilized During Treatment: Gait belt Activity  Tolerance: Patient limited by fatigue Patient left: with call bell/phone within reach;in chair;with chair alarm set Nurse Communication: Mobility status PT Visit Diagnosis: Muscle weakness (generalized) (M62.81);History of falling (Z91.81);Repeated falls (R29.6);Difficulty in walking, not elsewhere classified (R26.2);Pain Pain - Right/Left: Right Pain - part of body: Leg     Time: 1610-9604 PT Time Calculation (min) (ACUTE ONLY): 19 min  Charges:  $Therapeutic Activity: 8-22 mins         Ivar Drape 08/06/2022, 12:17 PM  Samul Dada, PT PhD Acute Rehab Dept. Number: [facility] [id number] and [facility] [phone]

## 2022-08-06 NOTE — Progress Notes (Signed)
PROGRESS NOTE  [name]  DOB: 06-09-1945  PCP: Lonie Peak, PA-C GMW:[id number]  DOA: 08/01/2022  LOS: 5 days  Hospital Day: 6  Brief narrative: [name] is a 77 y.o. male with PMH significant for DM2, HTN, HLD, CAD, paroxysmal atrial tachycardia, hypothyroidism, depression/anxiety. Patient was recently hospitalized 2/24 to 2/27 and was discharged to rehab where he stayed for about 6 weeks and was discharged on 4/17.  Patient was still weak and had a fall within 24 hours of living at home alone. 4/19, patient was brought to ED from home by EMS after a fall.  Patient initially refused to be transferred to the ED but he called back after several hours for generalized weakness and hence brought back to ED.  This presentation, he was noted to be in septic shock secondary to UTI Urinalysis showed cloudy yellow urine with large amount of leukocytes, hematuria Started on IV antibiotics.  Admitted to ICU on pressors Pressors were weaned off next day 4/20 Urine culture and blood culture grew Klebsiella pneumoniae.  Antibiotics streamlined 4/22, transferred out to [facility]  Subjective: Patient was seen and examined this afternoon.  Lying on bed.  Not in distress.  Pending SNF.  Does not like telemetry monitoring  Assessment and plan: Septic shock secondary to UTI Klebsiella pneumonia bacteremia and UTI Initially admitted to ICU.  Hemodynamically stabilized now Currently on IV Rocephin WBC count and lactic acid level improved Discussed with pharmacy.   4/23, switch to oral Augmentin.  Total of 10 days planned. Continue to monitor Recent Labs  Lab 08/01/22 1558 08/01/22 1815 08/02/22 0159 08/02/22 0608 08/04/22 0448  WBC 21.7*  --  26.5*  --  8.8  LATICACIDVEN 2.1* 2.2* 2.0* 1.8  --   PROCALCITON  --   --  67.74  --   --    B/l renal calculi History of nephrolithiasis  On admission, CT abd/pelvis showed 5 mm renal calculus within the dependent portion of the left renal  pelvis and a 6 mm renal calculus within the dependent portion of a mildly dilated right renal pelvis.  Findings largely unchanged from previous scan.  Currently no evidence of obstruction. Continue outpatient follow-up with urology.   AKI Hyponatremia Creatinine and sodium level improved with IV hydration Recent Labs    06/04/22 0800 06/05/22 0510 06/06/22 0752 06/07/22 0429 06/08/22 0409 06/09/22 0730 06/10/22 0310 08/01/22 1558 08/02/22 0203 08/04/22 0448  BUN 29* 22 27* 26* 26* 24* 23 20 20 12   CREATININE 0.80 0.88 0.78 0.78 0.75 0.70 0.71 1.29* 1.04 0.77   Recent Labs  Lab 08/01/22 1558 08/02/22 0203 08/04/22 0448  NA 132* 132* 137   Hypomagnesemia Magnesium level improved with replacement. Recent Labs  Lab 08/01/22 1558 08/02/22 0203 08/02/22 1939 08/04/22 0448  K 4.6 4.3  --  3.9  MG  --  1.1* 2.1  --    Acute anemia Hemoglobin was more than 11 in February.  Downtrending this admission.  No evidence of bleeding.  Could be dilutional.  Continue to monitor. Recent Labs    06/08/22 0409 06/10/22 0310 08/01/22 1558 08/02/22 0159 08/04/22 0448  HGB 13.1 11.8* 9.4* 8.9* 8.2*  MCV 89.1 90.4 93.8 96.6 91.0   Type 2 diabetes mellitus A1c 8.1 on 06/05/2022 PTA on metformin 500 mg twice daily, Premeal sliding scale insulin Currently on sliding scale insulin only. Recent Labs  Lab 08/05/22 2212 08/06/22 0111 08/06/22 0409 08/06/22 0937 08/06/22 1142  GLUCAP 163* 141* 134* 211* 171*  Essential hypertension PTA on Coreg 3.125 mg twice daily, amlodipine 10 mg daily, losartan 100 mg daily. Currently on Coreg at 3.125 mg twice daily this morning.  Blood pressure elevated.  Resume amlodipine today.   Continue IV hydralazine as needed.   paroxysmal atrial tachycardia Currently in sinus rhythm with beta-blocker on hold  CAD/HLD Continue aspirin, Plavix, statin  Hypothyroidism Continue Synthroid  Hepatic steatosis Continue  statin  Depression/anxiety Continue sertraline   Multiple rotator cuff tears on right 2/25, MRI shoulder also showed multiple rotator cuff tears and bony Bankart fracture.  Orthopedics recommended conservative management Robaxin and tramadol as needed for pain control Follow-up with orthopedics as an outpatient   Chronic back pain  Lumbar postlaminectomy syndrome S/p underwent left L3 hemilaminectomy and microdiscectomy -06/14/14 Dr. Newell Coral. Patient has lumbar postlaminectomy syndrome since then  C-spine foraminal stenosis and radiculopathy He was recently hospitalized 2/24-2/27 for right upper extremity weakness and recurrent falls. MRI at that time showed C5-C6 moderate to severe right foraminal stenosis.  Neurosurgery recommended conservative management.   Impaired mobility Frequent falls Secondary to multiple musculoskeletal and spinal issues as above PT eval obtained.  SNF recommended  Goals of care   Code Status: Full Code     DVT prophylaxis:  heparin injection 5,000 Units Start: 08/01/22 2245   Antimicrobials: Oral Augmentin currently Fluid: None Consultants: PCCM Family Communication: Sister Junious Dresser not at bedside today.  Status: Inpatient Level of care:  Telemetry Medical   Needs to continue in-hospital care:  Needs SNF placement  Patient from: Home Anticipated d/c to: SNF pending   Diet:  Diet Order             Diet heart healthy/carb modified Room service appropriate? Yes with Assist; Fluid consistency: Thin  Diet effective now                   Scheduled Meds:  amLODipine  5 mg Oral Daily   amoxicillin-clavulanate  1 tablet Oral Q12H   aspirin EC  81 mg Oral Daily   atorvastatin  80 mg Oral Daily   carvedilol  3.125 mg Oral BID WC   Chlorhexidine Gluconate Cloth  6 each Topical Daily   clopidogrel  75 mg Oral Q breakfast   heparin  5,000 Units Subcutaneous Q8H   insulin aspart  0-9 Units Subcutaneous Q4H   levothyroxine  50 mcg Oral  QAC breakfast   mupirocin ointment  1 Application Nasal BID   sertraline  100 mg Oral QHS    PRN meds: acetaminophen, docusate sodium, methocarbamol, ondansetron (ZOFRAN) IV, mouth rinse, polyethylene glycol, traMADol   Infusions:   sodium chloride Stopped (08/04/22 0031)    Antimicrobials: Anti-infectives (From admission, onward)    Start     Dose/Rate Route Frequency Ordered Stop   08/05/22 1800  amoxicillin-clavulanate (AUGMENTIN) 875-125 MG per tablet 1 tablet        1 tablet Oral Every 12 hours 08/05/22 0910 08/09/22 0959   08/03/22 2200  cefTRIAXone (ROCEPHIN) 2 g in sodium chloride 0.9 % 100 mL IVPB  Status:  Discontinued        2 g 200 mL/hr over 30 Minutes Intravenous Every 24 hours 08/03/22 1404 08/05/22 0910   08/02/22 1400  ceFEPIme (MAXIPIME) 2 g in sodium chloride 0.9 % 100 mL IVPB  Status:  Discontinued        2 g 200 mL/hr over 30 Minutes Intravenous Every 8 hours 08/02/22 1123 08/03/22 1404   08/01/22 1800  ceFEPIme (MAXIPIME) 2  g in sodium chloride 0.9 % 100 mL IVPB  Status:  Discontinued        2 g 200 mL/hr over 30 Minutes Intravenous  Once 08/01/22 1748 08/01/22 1750   08/01/22 1800  metroNIDAZOLE (FLAGYL) IVPB 500 mg        500 mg 100 mL/hr over 60 Minutes Intravenous  Once 08/01/22 1748 08/01/22 1937   08/01/22 1800  ceFEPIme (MAXIPIME) 2 g in sodium chloride 0.9 % 100 mL IVPB  Status:  Discontinued        2 g 200 mL/hr over 30 Minutes Intravenous Every 12 hours 08/01/22 1750 08/02/22 1123       Nutritional status:  Body mass index is 36.01 kg/m.          Objective: Vitals:   08/06/22 0443 08/06/22 0809  BP: (!) 162/81 (!) 157/75  Pulse: 76 93  Resp: 16 17  Temp: 97.9 F (36.6 C) 98.1 F (36.7 C)  SpO2: 98% 98%    Intake/Output Summary (Last 24 hours) at 08/06/2022 1420 Last data filed at 08/06/2022 0443 Gross per 24 hour  Intake 240 ml  Output 1600 ml  Net -1360 ml   Filed Weights   08/03/22 0411 08/04/22 0500 08/05/22 0416   Weight: 109.7 kg 109.7 kg 110.6 kg   Weight change:  Body mass index is 36.01 kg/m.   Physical Exam: General exam: Pleasant, elderly Caucasian male.  Not in pain currently Skin: No rashes, lesions or ulcers. HEENT: Atraumatic, normocephalic, no obvious bleeding Lungs: Clear to auscultation bilaterally CVS: Regular rate and rhythm, normal GI/Abd soft, nontender, nondistended, bowel sound present CNS: Alert, awake, hard of hearing but oriented to place and person Psychiatry: Sad affect Extremities: No pedal edema, no calf redness  Data Review: I have personally reviewed the laboratory data and studies available.  F/u labs ordered Unresulted Labs (From admission, onward)     Start     Ordered   08/07/22 0500  CBC with Differential/Platelet  Tomorrow morning,   R       Question:  Specimen collection method  Answer:  Lab=Lab collect   08/06/22 1420   08/07/22 0500  Basic metabolic panel  Tomorrow morning,   R       Question:  Specimen collection method  Answer:  Lab=Lab collect   08/06/22 1420            Total time spent in review of labs and imaging, patient evaluation, formulation of plan, documentation and communication with family: 45 minutes  Signed, Lorin Glass, MD [facility] 08/06/2022

## 2022-08-07 DIAGNOSIS — R079 Chest pain, unspecified: Secondary | ICD-10-CM | POA: Diagnosis not present

## 2022-08-07 DIAGNOSIS — R6521 Severe sepsis with septic shock: Secondary | ICD-10-CM | POA: Diagnosis not present

## 2022-08-07 DIAGNOSIS — H9193 Unspecified hearing loss, bilateral: Secondary | ICD-10-CM | POA: Diagnosis not present

## 2022-08-07 DIAGNOSIS — G8929 Other chronic pain: Secondary | ICD-10-CM | POA: Diagnosis not present

## 2022-08-07 DIAGNOSIS — R109 Unspecified abdominal pain: Secondary | ICD-10-CM | POA: Diagnosis not present

## 2022-08-07 DIAGNOSIS — I10 Essential (primary) hypertension: Secondary | ICD-10-CM | POA: Diagnosis not present

## 2022-08-07 DIAGNOSIS — M545 Low back pain, unspecified: Secondary | ICD-10-CM | POA: Diagnosis not present

## 2022-08-07 DIAGNOSIS — Z7401 Bed confinement status: Secondary | ICD-10-CM | POA: Diagnosis not present

## 2022-08-07 DIAGNOSIS — R531 Weakness: Secondary | ICD-10-CM | POA: Diagnosis not present

## 2022-08-07 DIAGNOSIS — M62838 Other muscle spasm: Secondary | ICD-10-CM | POA: Diagnosis not present

## 2022-08-07 DIAGNOSIS — F32A Depression, unspecified: Secondary | ICD-10-CM | POA: Diagnosis not present

## 2022-08-07 DIAGNOSIS — B028 Zoster with other complications: Secondary | ICD-10-CM | POA: Diagnosis not present

## 2022-08-07 DIAGNOSIS — E785 Hyperlipidemia, unspecified: Secondary | ICD-10-CM | POA: Diagnosis not present

## 2022-08-07 DIAGNOSIS — A419 Sepsis, unspecified organism: Secondary | ICD-10-CM | POA: Diagnosis not present

## 2022-08-07 DIAGNOSIS — K5900 Constipation, unspecified: Secondary | ICD-10-CM | POA: Diagnosis not present

## 2022-08-07 DIAGNOSIS — N200 Calculus of kidney: Secondary | ICD-10-CM | POA: Diagnosis not present

## 2022-08-07 DIAGNOSIS — A4159 Other Gram-negative sepsis: Secondary | ICD-10-CM | POA: Diagnosis not present

## 2022-08-07 DIAGNOSIS — I2510 Atherosclerotic heart disease of native coronary artery without angina pectoris: Secondary | ICD-10-CM | POA: Diagnosis not present

## 2022-08-07 DIAGNOSIS — I4719 Other supraventricular tachycardia: Secondary | ICD-10-CM | POA: Diagnosis not present

## 2022-08-07 DIAGNOSIS — N390 Urinary tract infection, site not specified: Secondary | ICD-10-CM | POA: Diagnosis not present

## 2022-08-07 DIAGNOSIS — Z7901 Long term (current) use of anticoagulants: Secondary | ICD-10-CM | POA: Diagnosis not present

## 2022-08-07 DIAGNOSIS — K5730 Diverticulosis of large intestine without perforation or abscess without bleeding: Secondary | ICD-10-CM | POA: Diagnosis not present

## 2022-08-07 DIAGNOSIS — Z9181 History of falling: Secondary | ICD-10-CM | POA: Diagnosis not present

## 2022-08-07 DIAGNOSIS — E039 Hypothyroidism, unspecified: Secondary | ICD-10-CM | POA: Diagnosis not present

## 2022-08-07 DIAGNOSIS — I700 Atherosclerosis of aorta: Secondary | ICD-10-CM | POA: Diagnosis not present

## 2022-08-07 DIAGNOSIS — Z79899 Other long term (current) drug therapy: Secondary | ICD-10-CM | POA: Diagnosis not present

## 2022-08-07 DIAGNOSIS — N179 Acute kidney failure, unspecified: Secondary | ICD-10-CM | POA: Diagnosis not present

## 2022-08-07 DIAGNOSIS — E119 Type 2 diabetes mellitus without complications: Secondary | ICD-10-CM | POA: Diagnosis not present

## 2022-08-07 DIAGNOSIS — Z7409 Other reduced mobility: Secondary | ICD-10-CM | POA: Diagnosis not present

## 2022-08-07 LAB — CBC WITH DIFFERENTIAL/PLATELET
Abs Immature Granulocytes: 0 10*3/uL (ref 0.00–0.07)
Basophils Absolute: 0.3 10*3/uL — ABNORMAL HIGH (ref 0.0–0.1)
Basophils Relative: 3 %
Eosinophils Absolute: 0.4 10*3/uL (ref 0.0–0.5)
Eosinophils Relative: 4 %
HCT: 28.6 % — ABNORMAL LOW (ref 39.0–52.0)
Hemoglobin: 9.5 g/dL — ABNORMAL LOW (ref 13.0–17.0)
Lymphocytes Relative: 22 %
Lymphs Abs: 2.2 10*3/uL (ref 0.7–4.0)
MCH: 29.5 pg (ref 26.0–34.0)
MCHC: 33.2 g/dL (ref 30.0–36.0)
MCV: 88.8 fL (ref 80.0–100.0)
Monocytes Absolute: 0.7 10*3/uL (ref 0.1–1.0)
Monocytes Relative: 7 %
Neutro Abs: 6.4 10*3/uL (ref 1.7–7.7)
Neutrophils Relative %: 64 %
Platelets: 247 10*3/uL (ref 150–400)
RBC: 3.22 MIL/uL — ABNORMAL LOW (ref 4.22–5.81)
RDW: 14.7 % (ref 11.5–15.5)
WBC: 10 10*3/uL (ref 4.0–10.5)
nRBC: 0 % (ref 0.0–0.2)
nRBC: 0 /100 WBC

## 2022-08-07 LAB — BASIC METABOLIC PANEL
Anion gap: 9 (ref 5–15)
BUN: 10 mg/dL (ref 8–23)
CO2: 24 mmol/L (ref 22–32)
Calcium: 9.1 mg/dL (ref 8.9–10.3)
Chloride: 102 mmol/L (ref 98–111)
Creatinine, Ser: 0.62 mg/dL (ref 0.61–1.24)
GFR, Estimated: >60 mL/min (ref >60)
Glucose, Bld: 159 mg/dL — ABNORMAL HIGH (ref 70–99)
Potassium: 4.2 mmol/L (ref 3.5–5.1)
Sodium: 135 mmol/L (ref 135–145)

## 2022-08-07 LAB — GLUCOSE, CAPILLARY
Glucose-Capillary: 138 mg/dL — ABNORMAL HIGH (ref 70–99)
Glucose-Capillary: 150 mg/dL — ABNORMAL HIGH (ref 70–99)
Glucose-Capillary: 153 mg/dL — ABNORMAL HIGH (ref 70–99)
Glucose-Capillary: 161 mg/dL — ABNORMAL HIGH (ref 70–99)

## 2022-08-07 MED ORDER — TRAMADOL HCL 50 MG PO TABS: 50.0000 mg | ORAL_TABLET | Freq: Three times a day (TID) | ORAL | 0 refills | Status: AC | PRN

## 2022-08-07 MED ORDER — AMOXICILLIN-POT CLAVULANATE 875-125 MG PO TABS: 1.0000 | ORAL_TABLET | Freq: Two times a day (BID) | ORAL | 0 refills | Status: AC

## 2022-08-07 MED ORDER — AMLODIPINE BESYLATE 5 MG PO TABS: 5.0000 mg | ORAL_TABLET | Freq: Every day | ORAL | Status: AC

## 2022-08-07 MED ORDER — INSULIN ASPART 100 UNIT/ML IJ SOLN: 0.0000 [IU] | INTRAMUSCULAR | 11 refills | Status: DC

## 2022-08-07 MED ORDER — METHOCARBAMOL 750 MG PO TABS: 750.0000 mg | ORAL_TABLET | Freq: Three times a day (TID) | ORAL | Status: DC | PRN

## 2022-08-07 MED ORDER — DOCUSATE SODIUM 100 MG PO CAPS: 100.0000 mg | ORAL_CAPSULE | Freq: Two times a day (BID) | ORAL | 0 refills | Status: AC | PRN

## 2022-08-07 NOTE — Consult Note (Signed)
   [facility] Inpatient Consult   08/07/2022  ISMEAL HEIDER 1945/12/12 [id number]  [facility] [[facility]]  Accountable Care Organization [ACO] Patient: [facility]  Primary Care Provider:  Lonie Peak, PA-C   Patient screened for hospitalization with noted medium risk score for unplanned readmission risk lo assess for potential [facility]  [[facility]] Care Management service needs for post hospital transition for care coordination.  Review of patient's electronic medical record reveals patient is for a skilled nursing facility level of care for post hospital transition and a recent SNF stay once transitioned home [lives] alone had a fall. Per last inpatient TOC LCSW notes patient is for [facility].   Plan:  Continue to follow progress and disposition to assess for post hospital community care coordination/management needs.  Referral request for community care coordination:  if patient transitions to a [facility] affiliated SNF will ask the [facility] RN to follow.  Of note, [facility] Care Management/Population Health does not replace or interfere with any arrangements made by the Inpatient Transition of Care team.  For questions contact:   Charlesetta Shanks, RN BSN CCM [place] [facility]  [phone] business mobile phone Toll free office [phone]  *Concierge Line  [phone] Fax number: [phone] [email or web address]

## 2022-08-07 NOTE — Discharge Summary (Signed)
Physician Discharge Summary  [name]:[id number] DOB: [birth year] DOA: 08/01/2022  PCP: Lonie Peak, PA-C  Admit date: 08/01/2022 Discharge date: 08/07/2022  Admitted From: Home Discharge disposition: SNF   Brief narrative: [name] is a 77 y.o. male with PMH significant for DM2, HTN, HLD, CAD, paroxysmal atrial tachycardia, hypothyroidism, depression/anxiety. Patient was recently hospitalized 2/24 to 2/27 and was discharged to rehab where he stayed for about 6 weeks and was discharged on 4/17.  Patient was still weak and had a fall within 24 hours of living at home alone. 4/19, patient was brought to ED from home by EMS after a fall.  Patient initially refused to be transferred to the ED but he called back after several hours for generalized weakness and hence brought back to ED.  This presentation, he was noted to be in septic shock secondary to UTI Urinalysis showed cloudy yellow urine with large amount of leukocytes, hematuria Started on IV antibiotics.  Admitted to ICU on pressors Pressors were weaned off next day 4/20 Urine culture and blood culture grew Klebsiella pneumoniae.  Antibiotics streamlined 4/22, transferred out to [facility]  Subjective: Patient was seen and examined this morning.  Propped up in bed.  Not in distress.  Incisions are present received for discharge to SNF today.  Assessment and plan: Septic shock secondary to UTI Klebsiella pneumonia bacteremia and UTI Initially admitted to ICU.  Hemodynamically stabilized now Based on sensitivity pattern of the bacterial growth, antibiotic was switched to IV Rocephin. WBC count and lactic acid level improved 4/23, switch to oral Augmentin.  Discharged on 1 more week of oral Augmentin. Recent Labs  Lab 08/01/22 1558 08/01/22 1815 08/02/22 0159 08/02/22 0608 08/04/22 0448 08/07/22 0615  WBC 21.7*  --  26.5*  --  8.8 10.0  LATICACIDVEN 2.1* 2.2* 2.0* 1.8  --   --   PROCALCITON  --   --  67.74  --    --   --    B/l renal calculi History of nephrolithiasis  On admission, CT abd/pelvis showed 5 mm renal calculus within the dependent portion of the left renal pelvis and a 6 mm renal calculus within the dependent portion of a mildly dilated right renal pelvis.  Findings largely unchanged from previous scan.  Currently no evidence of obstruction. Continue outpatient follow-up with urology.   AKI Hyponatremia Creatinine and sodium level improved with IV hydration Recent Labs    06/05/22 0510 06/06/22 0752 06/07/22 0429 06/08/22 0409 06/09/22 0730 06/10/22 0310 08/01/22 1558 08/02/22 0203 08/04/22 0448 08/07/22 0615  BUN 22 27* 26* 26* 24* 23 20 20 12 10   CREATININE 0.88 0.78 0.78 0.75 0.70 0.71 1.29* 1.04 0.77 0.62   Recent Labs  Lab 08/01/22 1558 08/02/22 0203 08/04/22 0448 08/07/22 0615  NA 132* 132* 137 135   Hypomagnesemia Magnesium level improved with replacement. Recent Labs  Lab 08/01/22 1558 08/02/22 0203 08/02/22 1939 08/04/22 0448 08/07/22 0615  K 4.6 4.3  --  3.9 4.2  MG  --  1.1* 2.1  --   --    Acute anemia Hemoglobin was more than 11 in February.  Downtrending this admission.  No evidence of bleeding.  Could be dilutional.   Recent Labs    06/10/22 0310 08/01/22 1558 08/02/22 0159 08/04/22 0448 08/07/22 0615  HGB 11.8* 9.4* 8.9* 8.2* 9.5*  MCV 90.4 93.8 96.6 91.0 88.8   Type 2 diabetes mellitus A1c 8.1 on 06/05/2022 PTA on metformin 500 mg twice daily, Premeal sliding  scale insulin Okay to resume metformin post discharge.  Continue sliding scale insulin. Recent Labs  Lab 08/06/22 1641 08/06/22 1925 08/07/22 0000 08/07/22 0513 08/07/22 1028  GLUCAP 158* 152* 150* 138* 161*   Essential hypertension PTA on Coreg 3.125 mg twice daily, amlodipine 10 mg daily, losartan 100 mg daily. Currently blood pressure is controlled on Coreg at 3.125 mg twice daily as well as amlodipine 5 mg daily.  Discharge with this regimen.   paroxysmal atrial  tachycardia Currently in sinus rhythm  CAD/HLD Continue aspirin, Plavix, statin  Hypothyroidism Continue Synthroid  Hepatic steatosis Continue statin  Depression/anxiety Continue sertraline   Multiple rotator cuff tears on right 2/25, MRI shoulder also showed multiple rotator cuff tears and bony Bankart fracture.  Orthopedics recommended conservative management Robaxin and tramadol as needed for pain control Follow-up with orthopedics as an outpatient   Chronic back pain  Lumbar postlaminectomy syndrome S/p underwent left L3 hemilaminectomy and microdiscectomy -06/14/14 Dr. Newell Coral. Patient has lumbar postlaminectomy syndrome since then  C-spine foraminal stenosis and radiculopathy He was recently hospitalized 2/24-2/27 for right upper extremity weakness and recurrent falls. MRI at that time showed C5-C6 moderate to severe right foraminal stenosis.  Neurosurgery recommended conservative management.   Impaired mobility Frequent falls Secondary to multiple musculoskeletal and spinal issues as above PT eval obtained.  SNF recommended  Goals of care   Code Status: Full Code   Wounds:  - Pressure Injury 08/02/22 Sacrum Mid Stage 1 -  Intact skin with non-blanchable redness of a localized area usually over a bony prominence. (Active)  Date First Assessed/Time First Assessed: 08/02/22 0040   Location: Sacrum  Location Orientation: Mid  Staging: Stage 1 -  Intact skin with non-blanchable redness of a localized area usually over a bony prominence.  Present on Admission: Yes    Assessments 08/02/2022  1:00 AM 08/05/2022 12:09 AM  Dressing Type Foam - Lift dressing to assess site every shift --  Dressing Clean, Dry, Intact Clean, Dry, Intact  Dressing Change Frequency Every 3 days --  Site / Wound Assessment Clean;Dry;Pink Clean;Dry;Pink  Peri-wound Assessment Erythema (blanchable) Erythema (blanchable)  Wound Length (cm) 6 cm --  Wound Width (cm) 4 cm --  Wound Depth (cm) 0 cm --   Wound Surface Area (cm^2) 24 cm^2 --  Wound Volume (cm^3) 0 cm^3 --  Treatment Off loading Off loading     No associated orders.    Discharge Exam:   Vitals:   08/06/22 1923 08/07/22 0500 08/07/22 0510 08/07/22 0905  BP: 126/60  (!) 116/54 132/70  Pulse: 78  80 88  Resp: 16  17   Temp: 98 F (36.7 C)  98.1 F (36.7 C) (!) 97.5 F (36.4 C)  TempSrc:   Oral Oral  SpO2: 100%  99% 98%  Weight:  106.7 kg    Height:        Body mass index is 34.74 kg/m.  General exam: Pleasant, elderly Caucasian male.  Not in pain currently Skin: No rashes, lesions or ulcers. HEENT: Atraumatic, normocephalic, no obvious bleeding Lungs: Clear to auscultation bilaterally CVS: Regular rate and rhythm, normal GI/Abd soft, nontender, nondistended, bowel sound present CNS: Alert, awake, hard of hearing but oriented to place and person Psychiatry: Sad affect Extremities: No pedal edema, no calf redness  Follow ups:    Contact information for follow-up providers     Lonie Peak, PA-C Follow up.   Specialty: Physician Field seismologist information: [address] [place] [address] [phone]  Contact information for after-discharge care     Destination     [facility] [place] Preferred SNF .   Service: Skilled Nursing Contact information: [address] [place] [address] [phone]                     Discharge Instructions:   Discharge Instructions     Call MD for:  difficulty breathing, headache or visual disturbances   Complete by: As directed    Call MD for:  extreme fatigue   Complete by: As directed    Call MD for:  hives   Complete by: As directed    Call MD for:  persistant dizziness or light-headedness   Complete by: As directed    Call MD for:  persistant nausea and vomiting   Complete by: As directed    Call MD for:  severe uncontrolled pain   Complete by: As directed    Call MD  for:  temperature >100.4   Complete by: As directed    Diet Carb Modified   Complete by: As directed    Discharge instructions   Complete by: As directed    Discharge instructions for diabetes mellitus: Check blood sugar 3 times a day and bedtime at home. If blood sugar running above 200 or less than 70 please call your MD to adjust insulin. If you notice signs and symptoms of hypoglycemia (low blood sugar) like jitteriness, confusion, thirst, tremor and sweating, please check blood sugar, drink sugary drink/biscuits/sweets to increase sugar level and call MD or return to ER.    General discharge instructions: Follow with Primary MD Lonie Peak, PA-C in 7 days  Please request your PCP  to go over your hospital tests, procedures, radiology results at the follow up. Please get your medicines reviewed and adjusted.  Your PCP may decide to repeat certain labs or tests as needed. Do not drive, operate heavy machinery, perform activities at heights, swimming or participation in water activities or provide baby sitting services if your were admitted for syncope or siezures until you have seen by Primary MD or a Neurologist and advised to do so again. [place] Controlled Substance Reporting System database was reviewed. Do not drive, operate heavy machinery, perform activities at heights, swim, participate in water activities or provide baby-sitting services while on medications for pain, sleep and mood until your outpatient physician has reevaluated you and advised to do so again.  You are strongly recommended to comply with the dose, frequency and duration of prescribed medications. Activity: As tolerated with Full fall precautions use walker/cane & assistance as needed Avoid using any recreational substances like cigarette, tobacco, alcohol, or non-prescribed drug. If you experience worsening of your admission symptoms, develop shortness of breath, life threatening emergency, suicidal or  homicidal thoughts you must seek medical attention immediately by calling 911 or calling your MD immediately  if symptoms less severe. You must read complete instructions/literature along with all the possible adverse reactions/side effects for all the medicines you take and that have been prescribed to you. Take any new medicine only after you have completely understood and accepted all the possible adverse reactions/side effects.  Wear Seat belts while driving. You were cared for by a hospitalist during your hospital stay. If you have any questions about your discharge medications or the care you received while you were in the hospital after you are discharged, you can call the unit and ask to speak with the hospitalist or  the covering physician. Once you are discharged, your primary care physician will handle any further medical issues. Please note that NO REFILLS for any discharge medications will be authorized once you are discharged, as it is imperative that you return to your primary care physician (or establish a relationship with a primary care physician if you do not have one).   Discharge wound care:   Complete by: As directed    Increase activity slowly   Complete by: As directed        Discharge Medications:   Allergies as of 08/07/2022       Reactions   Misc. Sulfonamide Containing Compounds Rash   Sulfa Antibiotics Rash        Medication List     STOP taking these medications    losartan 100 MG tablet Commonly known as: COZAAR       TAKE these medications    acetaminophen 500 MG tablet Commonly known as: TYLENOL Take 1,000 mg by mouth every 6 (six) hours as needed for mild pain.   amLODipine 5 MG tablet Commonly known as: NORVASC Take 1 tablet (5 mg total) by mouth daily. Start taking on: August 08, 2022 What changed:  medication strength how much to take   amoxicillin-clavulanate 875-125 MG tablet Commonly known as: AUGMENTIN Take 1 tablet by mouth  every 12 (twelve) hours for 7 days.   aspirin 81 MG tablet Take 81 mg by mouth daily.   atorvastatin 80 MG tablet Commonly known as: LIPITOR Take 80 mg by mouth daily.   carvedilol 3.125 MG tablet Commonly known as: COREG Take 1 tablet (3.125 mg total) by mouth 2 (two) times daily with a meal.   clopidogrel 75 MG tablet Commonly known as: PLAVIX Take 75 mg by mouth daily with breakfast.   diclofenac Sodium 1 % Gel Commonly known as: VOLTAREN Apply 4 g topically 4 (four) times daily.   docusate sodium 100 MG capsule Commonly known as: COLACE Take 1 capsule (100 mg total) by mouth 2 (two) times daily as needed for mild constipation.   insulin aspart 100 UNIT/ML injection Commonly known as: novoLOG Inject 0-9 Units into the skin every 4 (four) hours. What changed:  how much to take when to take this   levothyroxine 50 MCG tablet Commonly known as: SYNTHROID Take 50 mcg by mouth daily before breakfast.   lidocaine 5 % Commonly known as: LIDODERM Place 1 patch onto the skin daily. Remove & Discard patch within 12 hours or as directed by MD   melatonin 3 MG Tabs tablet Take 1 tablet (3 mg total) by mouth at bedtime.   metFORMIN 500 MG 24 hr tablet Commonly known as: GLUCOPHAGE-XR Take 1 tablet (500 mg total) by mouth 2 (two) times daily.   methocarbamol 750 MG tablet Commonly known as: ROBAXIN Take 1 tablet (750 mg total) by mouth every 8 (eight) hours as needed for muscle spasms. What changed:  when to take this reasons to take this   nitroGLYCERIN 0.4 MG SL tablet Commonly known as: NITROSTAT Place 0.4 mg under the tongue every 5 (five) minutes as needed for chest pain.   polyethylene glycol powder 17 GM/SCOOP powder Commonly known as: GLYCOLAX/MIRALAX Take 17 g by mouth in the morning.   sertraline 100 MG tablet Commonly known as: ZOLOFT Take 1 tablet (100 mg total) by mouth at bedtime.   traMADol 50 MG tablet Commonly known as: ULTRAM Take 1 tablet  (50 mg total) by mouth every 8 (eight) hours  as needed for up to 5 days for moderate pain or severe pain.               Discharge Care Instructions  (From admission, onward)           Start     Ordered   08/07/22 0000  Discharge wound care:        08/07/22 1040             The results of significant diagnostics from this hospitalization (including imaging, microbiology, ancillary and laboratory) are listed below for reference.    Procedures and Diagnostic Studies:   DG FEMUR PORT, MIN 2 VIEWS RIGHT  Result Date: 08/02/2022 CLINICAL DATA:  Pain EXAM: RIGHT FEMUR PORTABLE 2 VIEW COMPARISON:  None Available. FINDINGS: There is no evidence of fracture or other focal bone lesions. Soft tissues are unremarkable. IMPRESSION: No fracture or dislocation. Electronically Signed   By: Lorenza Cambridge M.D.   On: 08/02/2022 12:49   CT ABDOMEN PELVIS W CONTRAST  Result Date: 08/01/2022 CLINICAL DATA:  Vomiting. EXAM: CT ABDOMEN AND PELVIS WITH CONTRAST TECHNIQUE: Multidetector CT imaging of the abdomen and pelvis was performed using the standard protocol following bolus administration of intravenous contrast. RADIATION DOSE REDUCTION: This exam was performed according to the departmental dose-optimization program which includes automated exposure control, adjustment of the mA and/or kV according to patient size and/or use of iterative reconstruction technique. CONTRAST:  75mL OMNIPAQUE IOHEXOL 350 MG/ML SOLN COMPARISON:  July 04, 2022 FINDINGS: Lower chest: No acute abnormality. Hepatobiliary: There is diffuse fatty infiltration of the liver parenchyma. No focal liver abnormality is seen. Status post cholecystectomy. No biliary dilatation. Pancreas: Unremarkable. No pancreatic ductal dilatation or surrounding inflammatory changes. Spleen: Normal in size without focal abnormality. Adrenals/Urinary Tract: The right adrenal gland is unremarkable. The left adrenal gland is nodular in appearance.  Kidneys are normal in size. A 4.6 cm diameter simple cyst is seen within the upper pole of the left kidney. A 5 mm renal calculus is seen within the dependent portion of the left renal pelvis. This is located within the mid left kidney on the prior study. A 6 mm renal calculus noted within the dependent portion of a mildly dilated right renal pelvis. This is unchanged in position when compared to the prior exam. Bladder is unremarkable. Stomach/Bowel: Stomach is within normal limits. Appendix appears normal. No evidence of bowel wall thickening, distention, or inflammatory changes. Noninflamed diverticula are seen throughout the sigmoid colon. Vascular/Lymphatic: Aortic atherosclerosis. No enlarged abdominal or pelvic lymph nodes. Reproductive: There is mild to moderate severity prostate gland enlargement. Other: No abdominal wall hernia or abnormality. No abdominopelvic ascites. Musculoskeletal: Postoperative changes are again seen within the mid and lower lumbar spine. Multilevel degenerative changes are also noted. IMPRESSION: 1. Hepatic steatosis. 2. Sigmoid diverticulosis. 3. Bilateral renal calculi, as described above, with interval movement of the left renal calculus from within the left kidney into the left renal pelvis. 4. Evidence of prior cholecystectomy. 5. Aortic atherosclerosis. 6. Enlarged prostate gland. 7. Postoperative changes within the mid and lower lumbar spine. Aortic Atherosclerosis (ICD10-I70.0). Electronically Signed   By: Aram Candela M.D.   On: 08/01/2022 19:51   DG Chest 2 View  Result Date: 08/01/2022 CLINICAL DATA:  Suspected sepsis. EXAM: CHEST - 2 VIEW COMPARISON:  Chest radiograph 06/04/2022 FINDINGS: The cardiomediastinal silhouette is within normal limits. There is an unchanged calcified granuloma in the left mid lung. No airspace consolidation, edema, pleural effusion, or pneumothorax is identified.  Prior cervical spine fusion is noted. IMPRESSION: No active  cardiopulmonary disease. Electronically Signed   By: Sebastian Ache M.D.   On: 08/01/2022 16:51     Labs:   Basic Metabolic Panel: Recent Labs  Lab 08/01/22 1558 08/02/22 0203 08/02/22 1939 08/04/22 0448 08/07/22 0615  NA 132* 132*  --  137 135  K 4.6 4.3  --  3.9 4.2  CL 102 101  --  104 102  CO2 20* 17*  --  25 24  GLUCOSE 159* 140*  --  157* 159*  BUN 20 20  --  12 10  CREATININE 1.29* 1.04  --  0.77 0.62  CALCIUM 8.2* 8.1*  --  8.3* 9.1  MG  --  1.1* 2.1  --   --    GFR Estimated Creatinine Clearance: 97.5 mL/min (by C-G formula based on SCr of 0.62 mg/dL). Liver Function Tests: Recent Labs  Lab 08/01/22 1558  AST 22  ALT 20  ALKPHOS 62  BILITOT 1.0  PROT 6.3*  ALBUMIN 2.9*   No results for input(s): "LIPASE", "AMYLASE" in the last 168 hours. No results for input(s): "AMMONIA" in the last 168 hours. Coagulation profile Recent Labs  Lab 08/01/22 1630  INR 1.2    CBC: Recent Labs  Lab 08/01/22 1558 08/02/22 0159 08/04/22 0448 08/07/22 0615  WBC 21.7* 26.5* 8.8 10.0  NEUTROABS  --   --   --  6.4  HGB 9.4* 8.9* 8.2* 9.5*  HCT 30.0* 28.4* 25.4* 28.6*  MCV 93.8 96.6 91.0 88.8  PLT 326 280 226 247   Cardiac Enzymes: No results for input(s): "CKTOTAL", "CKMB", "CKMBINDEX", "TROPONINI" in the last 168 hours. BNP: Invalid input(s): "POCBNP" CBG: Recent Labs  Lab 08/06/22 1641 08/06/22 1925 08/07/22 0000 08/07/22 0513 08/07/22 1028  GLUCAP 158* 152* 150* 138* 161*   D-Dimer No results for input(s): "DDIMER" in the last 72 hours. Hgb A1c No results for input(s): "HGBA1C" in the last 72 hours. Lipid Profile No results for input(s): "CHOL", "HDL", "LDLCALC", "TRIG", "CHOLHDL", "LDLDIRECT" in the last 72 hours. Thyroid function studies No results for input(s): "TSH", "T4TOTAL", "T3FREE", "THYROIDAB" in the last 72 hours.  Invalid input(s): "FREET3" Anemia work up No results for input(s): "VITAMINB12", "FOLATE", "FERRITIN", "TIBC", "IRON",  "RETICCTPCT" in the last 72 hours. Microbiology Recent Results (from the past 240 hour(s))  Culture, blood (Routine x 2)     Status: Abnormal   Collection Time: 08/01/22  3:58 PM   Specimen: BLOOD  Result Value Ref Range Status   Specimen Description BLOOD LEFT ANTECUBITAL  Final   Special Requests   Final    BOTTLES DRAWN AEROBIC AND ANAEROBIC Blood Culture adequate volume   Culture  Setup Time   Final    AEROBIC BOTTLE ONLY GRAM NEGATIVE RODS CRITICAL RESULT CALLED TO, READ BACK BY AND VERIFIED WITH:  C/ PHARMD J. MILLEN 08/03/22 1342 A. LAFRANCE Performed at [facility], [address], [place], [place] [address]    Culture KLEBSIELLA PNEUMONIAE (A)  Final   Report Status 08/05/2022 FINAL  Final   Organism ID, Bacteria KLEBSIELLA PNEUMONIAE  Final      Susceptibility   Klebsiella pneumoniae - MIC*    AMPICILLIN RESISTANT Resistant     CEFEPIME <=0.12 SENSITIVE Sensitive     CEFTAZIDIME <=1 SENSITIVE Sensitive     CEFTRIAXONE <=0.25 SENSITIVE Sensitive     CIPROFLOXACIN <=0.25 SENSITIVE Sensitive     GENTAMICIN <=1 SENSITIVE Sensitive     IMIPENEM <=  0.25 SENSITIVE Sensitive     TRIMETH/SULFA <=20 SENSITIVE Sensitive     AMPICILLIN/SULBACTAM 4 SENSITIVE Sensitive     PIP/TAZO <=4 SENSITIVE Sensitive     * KLEBSIELLA PNEUMONIAE  Blood Culture ID Panel (Reflexed)     Status: Abnormal   Collection Time: 08/01/22  3:58 PM  Result Value Ref Range Status   Enterococcus faecalis NOT DETECTED NOT DETECTED Final   Enterococcus Faecium NOT DETECTED NOT DETECTED Final   Listeria monocytogenes NOT DETECTED NOT DETECTED Final   Staphylococcus species NOT DETECTED NOT DETECTED Final   Staphylococcus aureus (BCID) NOT DETECTED NOT DETECTED Final   Staphylococcus epidermidis NOT DETECTED NOT DETECTED Final   Staphylococcus lugdunensis NOT DETECTED NOT DETECTED Final   Streptococcus species NOT DETECTED NOT DETECTED Final   Streptococcus agalactiae NOT DETECTED NOT DETECTED Final    Streptococcus pneumoniae NOT DETECTED NOT DETECTED Final   Streptococcus pyogenes NOT DETECTED NOT DETECTED Final   A.calcoaceticus-baumannii NOT DETECTED NOT DETECTED Final   Bacteroides fragilis NOT DETECTED NOT DETECTED Final   Enterobacterales DETECTED (A) NOT DETECTED Final    Comment: Enterobacterales represent a large order of gram negative bacteria, not a single organism. CRITICAL RESULT CALLED TO, READ BACK BY AND VERIFIED WITH:  C/ PHARMD J. MILLEN 08/03/22 1342 A. LAFRANCE    Enterobacter cloacae complex NOT DETECTED NOT DETECTED Final   Escherichia coli NOT DETECTED NOT DETECTED Final   Klebsiella aerogenes NOT DETECTED NOT DETECTED Final   Klebsiella oxytoca NOT DETECTED NOT DETECTED Final   Klebsiella pneumoniae DETECTED (A) NOT DETECTED Final    Comment: CRITICAL RESULT CALLED TO, READ BACK BY AND VERIFIED WITH:  C/ PHARMD J. MILLEN 08/03/22 1342 A. LAFRANCE    Proteus species NOT DETECTED NOT DETECTED Final   Salmonella species NOT DETECTED NOT DETECTED Final   Serratia marcescens NOT DETECTED NOT DETECTED Final   Haemophilus influenzae NOT DETECTED NOT DETECTED Final   Neisseria meningitidis NOT DETECTED NOT DETECTED Final   Pseudomonas aeruginosa NOT DETECTED NOT DETECTED Final   Stenotrophomonas maltophilia NOT DETECTED NOT DETECTED Final   Candida albicans NOT DETECTED NOT DETECTED Final   Candida auris NOT DETECTED NOT DETECTED Final   Candida glabrata NOT DETECTED NOT DETECTED Final   Candida krusei NOT DETECTED NOT DETECTED Final   Candida parapsilosis NOT DETECTED NOT DETECTED Final   Candida tropicalis NOT DETECTED NOT DETECTED Final   Cryptococcus neoformans/gattii NOT DETECTED NOT DETECTED Final   CTX-M ESBL NOT DETECTED NOT DETECTED Final   Carbapenem resistance IMP NOT DETECTED NOT DETECTED Final   Carbapenem resistance KPC NOT DETECTED NOT DETECTED Final   Carbapenem resistance NDM NOT DETECTED NOT DETECTED Final   Carbapenem resist OXA 48 LIKE NOT  DETECTED NOT DETECTED Final   Carbapenem resistance VIM NOT DETECTED NOT DETECTED Final    Comment: Performed at [facility], [address] [place], [place] [address]  Culture, blood (Routine x 2)     Status: None   Collection Time: 08/01/22  4:30 PM   Specimen: BLOOD LEFT FOREARM  Result Value Ref Range Status   Specimen Description BLOOD LEFT FOREARM  Final   Special Requests   Final    BOTTLES DRAWN AEROBIC AND ANAEROBIC Blood Culture results may not be optimal due to an inadequate volume of blood received in culture bottles   Culture   Final    NO GROWTH 5 DAYS Performed at [facility], [address] [place], [place] [address]  Report Status 08/06/2022 FINAL  Final  SARS Coronavirus 2 by RT PCR (hospital order, performed in [facility] hospital lab) *cepheid single result test* Anterior Nasal Swab     Status: None   Collection Time: 08/01/22  5:50 PM   Specimen: Anterior Nasal Swab  Result Value Ref Range Status   SARS Coronavirus 2 by RT PCR NEGATIVE NEGATIVE Final    Comment: Performed at [facility], [address] [place], [place] [address]  Urine Culture (for pregnant, neutropenic or urologic patients or patients with an indwelling urinary catheter)     Status: Abnormal   Collection Time: 08/01/22  8:14 PM   Specimen: Urine, Clean Catch  Result Value Ref Range Status   Specimen Description URINE, CLEAN CATCH  Final   Special Requests   Final    NONE Performed at [facility], [address] [place], [place] [address]    Culture 30,000 COLONIES/mL KLEBSIELLA PNEUMONIAE (A)  Final   Report Status 08/04/2022 FINAL  Final   Organism ID, Bacteria KLEBSIELLA PNEUMONIAE (A)  Final      Susceptibility   Klebsiella pneumoniae - MIC*    AMPICILLIN RESISTANT Resistant     CEFAZOLIN <=4 SENSITIVE Sensitive     CEFEPIME <=0.12 SENSITIVE Sensitive     CEFTRIAXONE <=0.25 SENSITIVE Sensitive     CIPROFLOXACIN <=0.25 SENSITIVE Sensitive      GENTAMICIN <=1 SENSITIVE Sensitive     IMIPENEM <=0.25 SENSITIVE Sensitive     NITROFURANTOIN 128 RESISTANT Resistant     TRIMETH/SULFA <=20 SENSITIVE Sensitive     AMPICILLIN/SULBACTAM 4 SENSITIVE Sensitive     PIP/TAZO <=4 SENSITIVE Sensitive     * 30,000 COLONIES/mL KLEBSIELLA PNEUMONIAE  MRSA Next Gen by PCR, Nasal     Status: Abnormal   Collection Time: 08/02/22 12:53 AM   Specimen: Nasal Mucosa; Nasal Swab  Result Value Ref Range Status   MRSA by PCR Next Gen DETECTED (A) NOT DETECTED Final    Comment: RESULT CALLED TO, READ BACK BY AND VERIFIED WITH: A. COOKE RN 08/02/22 @ 0425 BY AB (NOTE) The GeneXpert MRSA Assay (FDA approved for NASAL specimens only), is one component of a comprehensive MRSA colonization surveillance program. It is not intended to diagnose MRSA infection nor to guide or monitor treatment for MRSA infections. Test performance is not FDA approved in patients less than 61 years old. Performed at [facility], [address] [place], [place] [address]     Time coordinating discharge: 45 minutes  Signed: Donnalyn Juran  [facility] 08/07/2022, 11:43 AM

## 2022-08-07 NOTE — TOC Transition Note (Signed)
Transition of Care [facility]) - CM/SW Discharge Note   Patient Details  Name: [name] MRN: [id number] Date of Birth: 1946-01-05  Transition of Care [facility]) CM/SW Contact:  Mahkai Fangman A [place], Theresia Majors Phone Number: 08/07/2022, 5:18 PM   Clinical Narrative:     Patient will DC to: [facility]  Anticipated DC date:  08/07/22 Family notified: Kateri Mc  Transport by: Sharin Mons     Per MD patient ready for DC to . RN, patient, patient's family, and facility notified of DC. Discharge Summary and FL2 sent to facility. RN to call report prior to discharge ( Room 106, [phone]). DC packet on chart. Ambulance transport requested for patient.     CSW will sign off for now as social work intervention is no longer needed. Please consult [place] again if new needs arise.   Final next level of care: Skilled Nursing Facility Barriers to Discharge: Barriers Resolved   Patient Goals and CMS Choice CMS Medicare.gov Compare Post Acute Care list provided to:: Patient Choice offered to / list presented to : Patient, Sibling  Discharge Placement                Patient chooses bed at: [facility] Patient to be transferred to facility by: [facility] Name of family member notified: Kingsley Plan Patient and family notified of of transfer: 08/07/22  Discharge Plan and Services Additional resources added to the After Visit Summary for   In-house Referral: Clinical Social Work   Post Acute Care Choice: Skilled Nursing Facility                               Social Determinants of Health (SDOH) Interventions SDOH Screenings   Food Insecurity: No Food Insecurity (08/02/2022)  Housing: Low Risk  (08/02/2022)  Transportation Needs: No Transportation Needs (08/02/2022)  Utilities: Not At Risk (08/02/2022)  Tobacco Use: High Risk (08/02/2022)     Readmission Risk Interventions     No data to display

## 2022-08-08 DIAGNOSIS — A4159 Other Gram-negative sepsis: Secondary | ICD-10-CM | POA: Diagnosis not present

## 2022-08-08 DIAGNOSIS — Z79899 Other long term (current) drug therapy: Secondary | ICD-10-CM | POA: Diagnosis not present

## 2022-08-08 DIAGNOSIS — N390 Urinary tract infection, site not specified: Secondary | ICD-10-CM | POA: Diagnosis not present

## 2022-08-08 DIAGNOSIS — E119 Type 2 diabetes mellitus without complications: Secondary | ICD-10-CM | POA: Diagnosis not present

## 2022-08-08 DIAGNOSIS — I700 Atherosclerosis of aorta: Secondary | ICD-10-CM | POA: Diagnosis not present

## 2022-08-17 DIAGNOSIS — B028 Zoster with other complications: Secondary | ICD-10-CM | POA: Diagnosis not present

## 2022-08-26 ENCOUNTER — Other Ambulatory Visit: Payer: Self-pay | Admitting: *Deleted

## 2022-08-26 ENCOUNTER — Ambulatory Visit: Payer: Medicare Other | Admitting: Neurology

## 2022-08-26 DIAGNOSIS — M48061 Spinal stenosis, lumbar region without neurogenic claudication: Secondary | ICD-10-CM | POA: Diagnosis not present

## 2022-08-26 DIAGNOSIS — M961 Postlaminectomy syndrome, not elsewhere classified: Secondary | ICD-10-CM | POA: Diagnosis not present

## 2022-08-26 DIAGNOSIS — G8929 Other chronic pain: Secondary | ICD-10-CM | POA: Diagnosis not present

## 2022-08-26 DIAGNOSIS — M5416 Radiculopathy, lumbar region: Secondary | ICD-10-CM | POA: Diagnosis not present

## 2022-08-26 DIAGNOSIS — E11621 Type 2 diabetes mellitus with foot ulcer: Secondary | ICD-10-CM | POA: Diagnosis not present

## 2022-08-26 NOTE — Patient Outreach (Addendum)
Per [facility] [name] discharged from [name] skilled nursing facility on 08/22/22. Screening for potential [facility] care coordination services as benefit of health plan and Primary Care Provider.  Telephone call made to [name] at [phone] to discuss [facility] follow up . However, this was a non-working telephone number. Telephone call made to [phone]. HIPAA compliant voicemail message left to request to return call.   Addendum: Update received from [name] social worker indicating [facility] was arranged for PT/OT services.   [name], MSN, RN,BSN [facility] Post Acute Care Coordinator [phone] (Direct dial)

## 2022-08-28 DIAGNOSIS — M5416 Radiculopathy, lumbar region: Secondary | ICD-10-CM | POA: Diagnosis not present

## 2022-08-28 DIAGNOSIS — M48061 Spinal stenosis, lumbar region without neurogenic claudication: Secondary | ICD-10-CM | POA: Diagnosis not present

## 2022-08-28 DIAGNOSIS — M961 Postlaminectomy syndrome, not elsewhere classified: Secondary | ICD-10-CM | POA: Diagnosis not present

## 2022-08-28 DIAGNOSIS — E11621 Type 2 diabetes mellitus with foot ulcer: Secondary | ICD-10-CM | POA: Diagnosis not present

## 2022-08-28 DIAGNOSIS — G8929 Other chronic pain: Secondary | ICD-10-CM | POA: Diagnosis not present

## 2022-08-29 DIAGNOSIS — E039 Hypothyroidism, unspecified: Secondary | ICD-10-CM | POA: Diagnosis not present

## 2022-08-29 DIAGNOSIS — E1169 Type 2 diabetes mellitus with other specified complication: Secondary | ICD-10-CM | POA: Diagnosis not present

## 2022-08-29 DIAGNOSIS — E7800 Pure hypercholesterolemia, unspecified: Secondary | ICD-10-CM | POA: Diagnosis not present

## 2022-08-29 DIAGNOSIS — I10 Essential (primary) hypertension: Secondary | ICD-10-CM | POA: Diagnosis not present

## 2022-08-29 DIAGNOSIS — Z7409 Other reduced mobility: Secondary | ICD-10-CM | POA: Diagnosis not present

## 2022-08-29 DIAGNOSIS — I2510 Atherosclerotic heart disease of native coronary artery without angina pectoris: Secondary | ICD-10-CM | POA: Diagnosis not present

## 2022-08-29 DIAGNOSIS — M545 Low back pain, unspecified: Secondary | ICD-10-CM | POA: Diagnosis not present

## 2022-09-02 DIAGNOSIS — Z79899 Other long term (current) drug therapy: Secondary | ICD-10-CM | POA: Diagnosis not present

## 2022-09-02 DIAGNOSIS — E119 Type 2 diabetes mellitus without complications: Secondary | ICD-10-CM | POA: Diagnosis not present

## 2022-09-02 DIAGNOSIS — M6281 Muscle weakness (generalized): Secondary | ICD-10-CM | POA: Diagnosis not present

## 2022-09-02 DIAGNOSIS — E039 Hypothyroidism, unspecified: Secondary | ICD-10-CM | POA: Diagnosis not present

## 2022-09-02 DIAGNOSIS — R2681 Unsteadiness on feet: Secondary | ICD-10-CM | POA: Diagnosis not present

## 2022-09-02 DIAGNOSIS — N200 Calculus of kidney: Secondary | ICD-10-CM | POA: Diagnosis not present

## 2022-09-02 DIAGNOSIS — R278 Other lack of coordination: Secondary | ICD-10-CM | POA: Diagnosis not present

## 2022-09-04 DIAGNOSIS — M6281 Muscle weakness (generalized): Secondary | ICD-10-CM | POA: Diagnosis not present

## 2022-09-04 DIAGNOSIS — E119 Type 2 diabetes mellitus without complications: Secondary | ICD-10-CM | POA: Diagnosis not present

## 2022-09-04 DIAGNOSIS — R2681 Unsteadiness on feet: Secondary | ICD-10-CM | POA: Diagnosis not present

## 2022-09-04 DIAGNOSIS — I700 Atherosclerosis of aorta: Secondary | ICD-10-CM | POA: Diagnosis not present

## 2022-09-04 DIAGNOSIS — N200 Calculus of kidney: Secondary | ICD-10-CM | POA: Diagnosis not present

## 2022-09-04 DIAGNOSIS — R260 Ataxic gait: Secondary | ICD-10-CM | POA: Diagnosis not present

## 2022-09-04 DIAGNOSIS — E1151 Type 2 diabetes mellitus with diabetic peripheral angiopathy without gangrene: Secondary | ICD-10-CM | POA: Diagnosis not present

## 2022-09-04 DIAGNOSIS — E039 Hypothyroidism, unspecified: Secondary | ICD-10-CM | POA: Diagnosis not present

## 2022-09-04 DIAGNOSIS — R278 Other lack of coordination: Secondary | ICD-10-CM | POA: Diagnosis not present

## 2022-09-04 DIAGNOSIS — Z9181 History of falling: Secondary | ICD-10-CM | POA: Diagnosis not present

## 2022-09-05 DIAGNOSIS — M6281 Muscle weakness (generalized): Secondary | ICD-10-CM | POA: Diagnosis not present

## 2022-09-05 DIAGNOSIS — N200 Calculus of kidney: Secondary | ICD-10-CM | POA: Diagnosis not present

## 2022-09-05 DIAGNOSIS — R2681 Unsteadiness on feet: Secondary | ICD-10-CM | POA: Diagnosis not present

## 2022-09-05 DIAGNOSIS — R278 Other lack of coordination: Secondary | ICD-10-CM | POA: Diagnosis not present

## 2022-09-05 DIAGNOSIS — E119 Type 2 diabetes mellitus without complications: Secondary | ICD-10-CM | POA: Diagnosis not present

## 2022-09-05 DIAGNOSIS — E039 Hypothyroidism, unspecified: Secondary | ICD-10-CM | POA: Diagnosis not present

## 2022-09-08 DIAGNOSIS — E039 Hypothyroidism, unspecified: Secondary | ICD-10-CM | POA: Diagnosis not present

## 2022-09-08 DIAGNOSIS — R2681 Unsteadiness on feet: Secondary | ICD-10-CM | POA: Diagnosis not present

## 2022-09-08 DIAGNOSIS — E119 Type 2 diabetes mellitus without complications: Secondary | ICD-10-CM | POA: Diagnosis not present

## 2022-09-08 DIAGNOSIS — M6281 Muscle weakness (generalized): Secondary | ICD-10-CM | POA: Diagnosis not present

## 2022-09-08 DIAGNOSIS — N200 Calculus of kidney: Secondary | ICD-10-CM | POA: Diagnosis not present

## 2022-09-08 DIAGNOSIS — R278 Other lack of coordination: Secondary | ICD-10-CM | POA: Diagnosis not present

## 2022-09-09 DIAGNOSIS — N200 Calculus of kidney: Secondary | ICD-10-CM | POA: Diagnosis not present

## 2022-09-09 DIAGNOSIS — R278 Other lack of coordination: Secondary | ICD-10-CM | POA: Diagnosis not present

## 2022-09-09 DIAGNOSIS — E119 Type 2 diabetes mellitus without complications: Secondary | ICD-10-CM | POA: Diagnosis not present

## 2022-09-09 DIAGNOSIS — E039 Hypothyroidism, unspecified: Secondary | ICD-10-CM | POA: Diagnosis not present

## 2022-09-09 DIAGNOSIS — M6281 Muscle weakness (generalized): Secondary | ICD-10-CM | POA: Diagnosis not present

## 2022-09-09 DIAGNOSIS — R2681 Unsteadiness on feet: Secondary | ICD-10-CM | POA: Diagnosis not present

## 2022-09-10 DIAGNOSIS — R278 Other lack of coordination: Secondary | ICD-10-CM | POA: Diagnosis not present

## 2022-09-10 DIAGNOSIS — E039 Hypothyroidism, unspecified: Secondary | ICD-10-CM | POA: Diagnosis not present

## 2022-09-10 DIAGNOSIS — E119 Type 2 diabetes mellitus without complications: Secondary | ICD-10-CM | POA: Diagnosis not present

## 2022-09-10 DIAGNOSIS — N200 Calculus of kidney: Secondary | ICD-10-CM | POA: Diagnosis not present

## 2022-09-10 DIAGNOSIS — M6281 Muscle weakness (generalized): Secondary | ICD-10-CM | POA: Diagnosis not present

## 2022-09-10 DIAGNOSIS — R2681 Unsteadiness on feet: Secondary | ICD-10-CM | POA: Diagnosis not present

## 2022-09-11 DIAGNOSIS — R278 Other lack of coordination: Secondary | ICD-10-CM | POA: Diagnosis not present

## 2022-09-11 DIAGNOSIS — N200 Calculus of kidney: Secondary | ICD-10-CM | POA: Diagnosis not present

## 2022-09-11 DIAGNOSIS — M6281 Muscle weakness (generalized): Secondary | ICD-10-CM | POA: Diagnosis not present

## 2022-09-11 DIAGNOSIS — R2681 Unsteadiness on feet: Secondary | ICD-10-CM | POA: Diagnosis not present

## 2022-09-11 DIAGNOSIS — E119 Type 2 diabetes mellitus without complications: Secondary | ICD-10-CM | POA: Diagnosis not present

## 2022-09-11 DIAGNOSIS — E039 Hypothyroidism, unspecified: Secondary | ICD-10-CM | POA: Diagnosis not present

## 2022-09-12 DIAGNOSIS — M6281 Muscle weakness (generalized): Secondary | ICD-10-CM | POA: Diagnosis not present

## 2022-09-12 DIAGNOSIS — R278 Other lack of coordination: Secondary | ICD-10-CM | POA: Diagnosis not present

## 2022-09-12 DIAGNOSIS — E039 Hypothyroidism, unspecified: Secondary | ICD-10-CM | POA: Diagnosis not present

## 2022-09-12 DIAGNOSIS — N200 Calculus of kidney: Secondary | ICD-10-CM | POA: Diagnosis not present

## 2022-09-12 DIAGNOSIS — R2681 Unsteadiness on feet: Secondary | ICD-10-CM | POA: Diagnosis not present

## 2022-09-12 DIAGNOSIS — E119 Type 2 diabetes mellitus without complications: Secondary | ICD-10-CM | POA: Diagnosis not present

## 2022-09-15 DIAGNOSIS — N200 Calculus of kidney: Secondary | ICD-10-CM | POA: Diagnosis not present

## 2022-09-15 DIAGNOSIS — R278 Other lack of coordination: Secondary | ICD-10-CM | POA: Diagnosis not present

## 2022-09-15 DIAGNOSIS — E039 Hypothyroidism, unspecified: Secondary | ICD-10-CM | POA: Diagnosis not present

## 2022-09-15 DIAGNOSIS — M6281 Muscle weakness (generalized): Secondary | ICD-10-CM | POA: Diagnosis not present

## 2022-09-15 DIAGNOSIS — R2681 Unsteadiness on feet: Secondary | ICD-10-CM | POA: Diagnosis not present

## 2022-09-15 DIAGNOSIS — E119 Type 2 diabetes mellitus without complications: Secondary | ICD-10-CM | POA: Diagnosis not present

## 2022-09-16 DIAGNOSIS — E119 Type 2 diabetes mellitus without complications: Secondary | ICD-10-CM | POA: Diagnosis not present

## 2022-09-16 DIAGNOSIS — N200 Calculus of kidney: Secondary | ICD-10-CM | POA: Diagnosis not present

## 2022-09-16 DIAGNOSIS — E039 Hypothyroidism, unspecified: Secondary | ICD-10-CM | POA: Diagnosis not present

## 2022-09-16 DIAGNOSIS — R278 Other lack of coordination: Secondary | ICD-10-CM | POA: Diagnosis not present

## 2022-09-16 DIAGNOSIS — M6281 Muscle weakness (generalized): Secondary | ICD-10-CM | POA: Diagnosis not present

## 2022-09-16 DIAGNOSIS — R2681 Unsteadiness on feet: Secondary | ICD-10-CM | POA: Diagnosis not present

## 2022-09-18 DIAGNOSIS — R278 Other lack of coordination: Secondary | ICD-10-CM | POA: Diagnosis not present

## 2022-09-18 DIAGNOSIS — R2681 Unsteadiness on feet: Secondary | ICD-10-CM | POA: Diagnosis not present

## 2022-09-18 DIAGNOSIS — E039 Hypothyroidism, unspecified: Secondary | ICD-10-CM | POA: Diagnosis not present

## 2022-09-18 DIAGNOSIS — M6281 Muscle weakness (generalized): Secondary | ICD-10-CM | POA: Diagnosis not present

## 2022-09-18 DIAGNOSIS — N200 Calculus of kidney: Secondary | ICD-10-CM | POA: Diagnosis not present

## 2022-09-18 DIAGNOSIS — E119 Type 2 diabetes mellitus without complications: Secondary | ICD-10-CM | POA: Diagnosis not present

## 2022-09-19 DIAGNOSIS — R260 Ataxic gait: Secondary | ICD-10-CM | POA: Diagnosis not present

## 2022-09-19 DIAGNOSIS — N200 Calculus of kidney: Secondary | ICD-10-CM | POA: Diagnosis not present

## 2022-09-19 DIAGNOSIS — E1151 Type 2 diabetes mellitus with diabetic peripheral angiopathy without gangrene: Secondary | ICD-10-CM | POA: Diagnosis not present

## 2022-09-19 DIAGNOSIS — Z9181 History of falling: Secondary | ICD-10-CM | POA: Diagnosis not present

## 2022-09-22 DIAGNOSIS — N200 Calculus of kidney: Secondary | ICD-10-CM | POA: Diagnosis not present

## 2022-09-22 DIAGNOSIS — M6281 Muscle weakness (generalized): Secondary | ICD-10-CM | POA: Diagnosis not present

## 2022-09-22 DIAGNOSIS — R278 Other lack of coordination: Secondary | ICD-10-CM | POA: Diagnosis not present

## 2022-09-22 DIAGNOSIS — E119 Type 2 diabetes mellitus without complications: Secondary | ICD-10-CM | POA: Diagnosis not present

## 2022-09-22 DIAGNOSIS — E039 Hypothyroidism, unspecified: Secondary | ICD-10-CM | POA: Diagnosis not present

## 2022-09-22 DIAGNOSIS — R2681 Unsteadiness on feet: Secondary | ICD-10-CM | POA: Diagnosis not present

## 2022-09-25 DIAGNOSIS — R2681 Unsteadiness on feet: Secondary | ICD-10-CM | POA: Diagnosis not present

## 2022-09-25 DIAGNOSIS — N200 Calculus of kidney: Secondary | ICD-10-CM | POA: Diagnosis not present

## 2022-09-25 DIAGNOSIS — R278 Other lack of coordination: Secondary | ICD-10-CM | POA: Diagnosis not present

## 2022-09-25 DIAGNOSIS — E119 Type 2 diabetes mellitus without complications: Secondary | ICD-10-CM | POA: Diagnosis not present

## 2022-09-25 DIAGNOSIS — M6281 Muscle weakness (generalized): Secondary | ICD-10-CM | POA: Diagnosis not present

## 2022-09-25 DIAGNOSIS — E039 Hypothyroidism, unspecified: Secondary | ICD-10-CM | POA: Diagnosis not present

## 2022-09-29 DIAGNOSIS — M6281 Muscle weakness (generalized): Secondary | ICD-10-CM | POA: Diagnosis not present

## 2022-09-29 DIAGNOSIS — E119 Type 2 diabetes mellitus without complications: Secondary | ICD-10-CM | POA: Diagnosis not present

## 2022-09-29 DIAGNOSIS — N200 Calculus of kidney: Secondary | ICD-10-CM | POA: Diagnosis not present

## 2022-09-29 DIAGNOSIS — E039 Hypothyroidism, unspecified: Secondary | ICD-10-CM | POA: Diagnosis not present

## 2022-09-29 DIAGNOSIS — R2681 Unsteadiness on feet: Secondary | ICD-10-CM | POA: Diagnosis not present

## 2022-09-29 DIAGNOSIS — R278 Other lack of coordination: Secondary | ICD-10-CM | POA: Diagnosis not present

## 2022-09-30 DIAGNOSIS — R109 Unspecified abdominal pain: Secondary | ICD-10-CM | POA: Diagnosis not present

## 2022-10-01 DIAGNOSIS — Z79899 Other long term (current) drug therapy: Secondary | ICD-10-CM | POA: Diagnosis not present

## 2022-10-01 DIAGNOSIS — R2681 Unsteadiness on feet: Secondary | ICD-10-CM | POA: Diagnosis not present

## 2022-10-01 DIAGNOSIS — E119 Type 2 diabetes mellitus without complications: Secondary | ICD-10-CM | POA: Diagnosis not present

## 2022-10-01 DIAGNOSIS — R278 Other lack of coordination: Secondary | ICD-10-CM | POA: Diagnosis not present

## 2022-10-01 DIAGNOSIS — N200 Calculus of kidney: Secondary | ICD-10-CM | POA: Diagnosis not present

## 2022-10-01 DIAGNOSIS — M6281 Muscle weakness (generalized): Secondary | ICD-10-CM | POA: Diagnosis not present

## 2022-10-01 DIAGNOSIS — E039 Hypothyroidism, unspecified: Secondary | ICD-10-CM | POA: Diagnosis not present

## 2022-10-09 DIAGNOSIS — Z79899 Other long term (current) drug therapy: Secondary | ICD-10-CM | POA: Diagnosis not present

## 2022-10-11 DIAGNOSIS — N3090 Cystitis, unspecified without hematuria: Secondary | ICD-10-CM | POA: Diagnosis not present

## 2022-10-11 DIAGNOSIS — N390 Urinary tract infection, site not specified: Secondary | ICD-10-CM | POA: Diagnosis not present

## 2022-10-20 DIAGNOSIS — L602 Onychogryphosis: Secondary | ICD-10-CM | POA: Diagnosis not present

## 2022-10-20 DIAGNOSIS — Z794 Long term (current) use of insulin: Secondary | ICD-10-CM | POA: Diagnosis not present

## 2022-10-20 DIAGNOSIS — E1151 Type 2 diabetes mellitus with diabetic peripheral angiopathy without gangrene: Secondary | ICD-10-CM | POA: Diagnosis not present

## 2022-10-20 DIAGNOSIS — L603 Nail dystrophy: Secondary | ICD-10-CM | POA: Diagnosis not present

## 2022-10-21 DIAGNOSIS — Z79899 Other long term (current) drug therapy: Secondary | ICD-10-CM | POA: Diagnosis not present

## 2022-10-24 DIAGNOSIS — N3090 Cystitis, unspecified without hematuria: Secondary | ICD-10-CM | POA: Diagnosis not present

## 2022-11-04 DIAGNOSIS — Z79899 Other long term (current) drug therapy: Secondary | ICD-10-CM | POA: Diagnosis not present

## 2022-11-05 DIAGNOSIS — Z79899 Other long term (current) drug therapy: Secondary | ICD-10-CM | POA: Diagnosis not present

## 2022-11-20 DIAGNOSIS — Z79899 Other long term (current) drug therapy: Secondary | ICD-10-CM | POA: Diagnosis not present

## 2022-11-20 DIAGNOSIS — L600 Ingrowing nail: Secondary | ICD-10-CM | POA: Diagnosis not present

## 2022-12-02 DIAGNOSIS — H26493 Other secondary cataract, bilateral: Secondary | ICD-10-CM | POA: Diagnosis not present

## 2022-12-02 DIAGNOSIS — H524 Presbyopia: Secondary | ICD-10-CM | POA: Diagnosis not present

## 2022-12-02 DIAGNOSIS — E119 Type 2 diabetes mellitus without complications: Secondary | ICD-10-CM | POA: Diagnosis not present

## 2022-12-02 DIAGNOSIS — Z961 Presence of intraocular lens: Secondary | ICD-10-CM | POA: Diagnosis not present

## 2022-12-03 DIAGNOSIS — Z79899 Other long term (current) drug therapy: Secondary | ICD-10-CM | POA: Diagnosis not present

## 2022-12-18 DIAGNOSIS — R079 Chest pain, unspecified: Secondary | ICD-10-CM | POA: Diagnosis not present

## 2022-12-18 DIAGNOSIS — R293 Abnormal posture: Secondary | ICD-10-CM | POA: Diagnosis not present

## 2022-12-18 DIAGNOSIS — E119 Type 2 diabetes mellitus without complications: Secondary | ICD-10-CM | POA: Diagnosis not present

## 2022-12-20 DIAGNOSIS — S20212A Contusion of left front wall of thorax, initial encounter: Secondary | ICD-10-CM | POA: Diagnosis not present

## 2022-12-23 DIAGNOSIS — E119 Type 2 diabetes mellitus without complications: Secondary | ICD-10-CM | POA: Diagnosis not present

## 2022-12-23 DIAGNOSIS — R293 Abnormal posture: Secondary | ICD-10-CM | POA: Diagnosis not present

## 2022-12-27 DIAGNOSIS — N390 Urinary tract infection, site not specified: Secondary | ICD-10-CM | POA: Diagnosis not present

## 2022-12-27 DIAGNOSIS — Z79899 Other long term (current) drug therapy: Secondary | ICD-10-CM | POA: Diagnosis not present

## 2022-12-27 DIAGNOSIS — E119 Type 2 diabetes mellitus without complications: Secondary | ICD-10-CM | POA: Diagnosis not present

## 2023-01-06 DIAGNOSIS — R293 Abnormal posture: Secondary | ICD-10-CM | POA: Diagnosis not present

## 2023-01-06 DIAGNOSIS — E119 Type 2 diabetes mellitus without complications: Secondary | ICD-10-CM | POA: Diagnosis not present

## 2023-01-23 DIAGNOSIS — N390 Urinary tract infection, site not specified: Secondary | ICD-10-CM | POA: Diagnosis not present

## 2023-01-25 DIAGNOSIS — S20212A Contusion of left front wall of thorax, initial encounter: Secondary | ICD-10-CM | POA: Diagnosis not present

## 2023-01-25 DIAGNOSIS — N390 Urinary tract infection, site not specified: Secondary | ICD-10-CM | POA: Diagnosis not present

## 2023-01-25 DIAGNOSIS — R1032 Left lower quadrant pain: Secondary | ICD-10-CM | POA: Diagnosis not present

## 2023-01-29 DIAGNOSIS — R109 Unspecified abdominal pain: Secondary | ICD-10-CM | POA: Diagnosis not present

## 2023-02-07 DIAGNOSIS — N390 Urinary tract infection, site not specified: Secondary | ICD-10-CM | POA: Diagnosis not present

## 2023-02-11 DIAGNOSIS — Z7984 Long term (current) use of oral hypoglycemic drugs: Secondary | ICD-10-CM | POA: Diagnosis not present

## 2023-02-11 DIAGNOSIS — L603 Nail dystrophy: Secondary | ICD-10-CM | POA: Diagnosis not present

## 2023-02-11 DIAGNOSIS — L602 Onychogryphosis: Secondary | ICD-10-CM | POA: Diagnosis not present

## 2023-02-11 DIAGNOSIS — E1151 Type 2 diabetes mellitus with diabetic peripheral angiopathy without gangrene: Secondary | ICD-10-CM | POA: Diagnosis not present

## 2023-02-16 DIAGNOSIS — D649 Anemia, unspecified: Secondary | ICD-10-CM | POA: Diagnosis not present

## 2023-03-08 DIAGNOSIS — S46002A Unspecified injury of muscle(s) and tendon(s) of the rotator cuff of left shoulder, initial encounter: Secondary | ICD-10-CM | POA: Diagnosis not present

## 2023-04-17 DIAGNOSIS — Z79899 Other long term (current) drug therapy: Secondary | ICD-10-CM | POA: Diagnosis not present

## 2023-04-21 DIAGNOSIS — N390 Urinary tract infection, site not specified: Secondary | ICD-10-CM | POA: Diagnosis not present

## 2023-04-24 DIAGNOSIS — Z7984 Long term (current) use of oral hypoglycemic drugs: Secondary | ICD-10-CM | POA: Diagnosis not present

## 2023-04-24 DIAGNOSIS — L602 Onychogryphosis: Secondary | ICD-10-CM | POA: Diagnosis not present

## 2023-04-24 DIAGNOSIS — L84 Corns and callosities: Secondary | ICD-10-CM | POA: Diagnosis not present

## 2023-04-24 DIAGNOSIS — L603 Nail dystrophy: Secondary | ICD-10-CM | POA: Diagnosis not present

## 2023-04-24 DIAGNOSIS — E1151 Type 2 diabetes mellitus with diabetic peripheral angiopathy without gangrene: Secondary | ICD-10-CM | POA: Diagnosis not present

## 2023-05-11 DIAGNOSIS — N390 Urinary tract infection, site not specified: Secondary | ICD-10-CM | POA: Diagnosis not present

## 2023-05-14 DIAGNOSIS — Z87440 Personal history of urinary (tract) infections: Secondary | ICD-10-CM | POA: Diagnosis not present

## 2023-06-25 DIAGNOSIS — Z79899 Other long term (current) drug therapy: Secondary | ICD-10-CM | POA: Diagnosis not present

## 2023-06-25 DIAGNOSIS — E119 Type 2 diabetes mellitus without complications: Secondary | ICD-10-CM | POA: Diagnosis not present

## 2023-06-26 DIAGNOSIS — E1151 Type 2 diabetes mellitus with diabetic peripheral angiopathy without gangrene: Secondary | ICD-10-CM | POA: Diagnosis not present

## 2023-06-26 DIAGNOSIS — E1165 Type 2 diabetes mellitus with hyperglycemia: Secondary | ICD-10-CM | POA: Diagnosis not present

## 2023-07-10 DIAGNOSIS — N390 Urinary tract infection, site not specified: Secondary | ICD-10-CM | POA: Diagnosis not present

## 2023-07-10 DIAGNOSIS — Z79899 Other long term (current) drug therapy: Secondary | ICD-10-CM | POA: Diagnosis not present

## 2023-07-10 DIAGNOSIS — D649 Anemia, unspecified: Secondary | ICD-10-CM | POA: Diagnosis not present

## 2023-07-10 DIAGNOSIS — E119 Type 2 diabetes mellitus without complications: Secondary | ICD-10-CM | POA: Diagnosis not present

## 2023-07-13 ENCOUNTER — Emergency Department (HOSPITAL_COMMUNITY)

## 2023-07-13 ENCOUNTER — Encounter (HOSPITAL_COMMUNITY)
Admission: EM | Disposition: A | Discharge status: Skilled Nursing Facility | Payer: Self-pay | Source: Skilled Nursing Facility | Attending: Internal Medicine

## 2023-07-13 ENCOUNTER — Other Ambulatory Visit: Payer: Self-pay

## 2023-07-13 ENCOUNTER — Inpatient Hospital Stay (HOSPITAL_COMMUNITY)
Admission: EM | Admit: 2023-07-13 | Discharge: 2023-07-17 | DRG: 660 | Disposition: A | Discharge status: Skilled Nursing Facility | Source: Skilled Nursing Facility | Attending: Internal Medicine | Admitting: Internal Medicine

## 2023-07-13 ENCOUNTER — Encounter (HOSPITAL_COMMUNITY): Payer: Self-pay

## 2023-07-13 DIAGNOSIS — Z466 Encounter for fitting and adjustment of urinary device: Secondary | ICD-10-CM | POA: Diagnosis not present

## 2023-07-13 DIAGNOSIS — Z974 Presence of external hearing-aid: Secondary | ICD-10-CM

## 2023-07-13 DIAGNOSIS — Z79899 Other long term (current) drug therapy: Secondary | ICD-10-CM

## 2023-07-13 DIAGNOSIS — I10 Essential (primary) hypertension: Secondary | ICD-10-CM | POA: Diagnosis present

## 2023-07-13 DIAGNOSIS — E871 Hypo-osmolality and hyponatremia: Secondary | ICD-10-CM | POA: Diagnosis not present

## 2023-07-13 DIAGNOSIS — D649 Anemia, unspecified: Secondary | ICD-10-CM | POA: Diagnosis not present

## 2023-07-13 DIAGNOSIS — Z8249 Family history of ischemic heart disease and other diseases of the circulatory system: Secondary | ICD-10-CM

## 2023-07-13 DIAGNOSIS — N10 Acute tubulo-interstitial nephritis: Secondary | ICD-10-CM | POA: Diagnosis not present

## 2023-07-13 DIAGNOSIS — Z87891 Personal history of nicotine dependence: Secondary | ICD-10-CM

## 2023-07-13 DIAGNOSIS — Z993 Dependence on wheelchair: Secondary | ICD-10-CM

## 2023-07-13 DIAGNOSIS — N136 Pyonephrosis: Secondary | ICD-10-CM | POA: Diagnosis not present

## 2023-07-13 DIAGNOSIS — R1032 Left lower quadrant pain: Secondary | ICD-10-CM | POA: Diagnosis not present

## 2023-07-13 DIAGNOSIS — Z87442 Personal history of urinary calculi: Secondary | ICD-10-CM

## 2023-07-13 DIAGNOSIS — Z7984 Long term (current) use of oral hypoglycemic drugs: Secondary | ICD-10-CM | POA: Diagnosis not present

## 2023-07-13 DIAGNOSIS — R739 Hyperglycemia, unspecified: Secondary | ICD-10-CM | POA: Diagnosis not present

## 2023-07-13 DIAGNOSIS — I110 Hypertensive heart disease with heart failure: Secondary | ICD-10-CM | POA: Diagnosis present

## 2023-07-13 DIAGNOSIS — N400 Enlarged prostate without lower urinary tract symptoms: Secondary | ICD-10-CM | POA: Diagnosis present

## 2023-07-13 DIAGNOSIS — I4719 Other supraventricular tachycardia: Secondary | ICD-10-CM | POA: Diagnosis present

## 2023-07-13 DIAGNOSIS — E1100 Type 2 diabetes mellitus with hyperosmolarity without nonketotic hyperglycemic-hyperosmolar coma (NKHHC): Principal | ICD-10-CM

## 2023-07-13 DIAGNOSIS — E1165 Type 2 diabetes mellitus with hyperglycemia: Secondary | ICD-10-CM | POA: Diagnosis not present

## 2023-07-13 DIAGNOSIS — Z981 Arthrodesis status: Secondary | ICD-10-CM

## 2023-07-13 DIAGNOSIS — Z955 Presence of coronary angioplasty implant and graft: Secondary | ICD-10-CM

## 2023-07-13 DIAGNOSIS — H905 Unspecified sensorineural hearing loss: Secondary | ICD-10-CM | POA: Diagnosis present

## 2023-07-13 DIAGNOSIS — R109 Unspecified abdominal pain: Secondary | ICD-10-CM | POA: Diagnosis not present

## 2023-07-13 DIAGNOSIS — R112 Nausea with vomiting, unspecified: Secondary | ICD-10-CM | POA: Diagnosis not present

## 2023-07-13 DIAGNOSIS — Z7982 Long term (current) use of aspirin: Secondary | ICD-10-CM

## 2023-07-13 DIAGNOSIS — E039 Hypothyroidism, unspecified: Secondary | ICD-10-CM | POA: Diagnosis not present

## 2023-07-13 DIAGNOSIS — G8929 Other chronic pain: Secondary | ICD-10-CM | POA: Diagnosis not present

## 2023-07-13 DIAGNOSIS — E860 Dehydration: Secondary | ICD-10-CM | POA: Diagnosis present

## 2023-07-13 DIAGNOSIS — M545 Low back pain, unspecified: Secondary | ICD-10-CM | POA: Diagnosis not present

## 2023-07-13 DIAGNOSIS — I25119 Atherosclerotic heart disease of native coronary artery with unspecified angina pectoris: Secondary | ICD-10-CM

## 2023-07-13 DIAGNOSIS — N201 Calculus of ureter: Secondary | ICD-10-CM | POA: Diagnosis present

## 2023-07-13 DIAGNOSIS — Z8261 Family history of arthritis: Secondary | ICD-10-CM

## 2023-07-13 DIAGNOSIS — Z794 Long term (current) use of insulin: Secondary | ICD-10-CM

## 2023-07-13 DIAGNOSIS — F32A Depression, unspecified: Secondary | ICD-10-CM | POA: Diagnosis present

## 2023-07-13 DIAGNOSIS — Z6837 Body mass index (BMI) 37.0-37.9, adult: Secondary | ICD-10-CM

## 2023-07-13 DIAGNOSIS — E118 Type 2 diabetes mellitus with unspecified complications: Secondary | ICD-10-CM | POA: Diagnosis present

## 2023-07-13 DIAGNOSIS — E119 Type 2 diabetes mellitus without complications: Secondary | ICD-10-CM | POA: Diagnosis not present

## 2023-07-13 DIAGNOSIS — E785 Hyperlipidemia, unspecified: Secondary | ICD-10-CM | POA: Diagnosis not present

## 2023-07-13 DIAGNOSIS — N390 Urinary tract infection, site not specified: Secondary | ICD-10-CM | POA: Diagnosis not present

## 2023-07-13 DIAGNOSIS — F17220 Nicotine dependence, chewing tobacco, uncomplicated: Secondary | ICD-10-CM | POA: Diagnosis present

## 2023-07-13 DIAGNOSIS — E66812 Obesity, class 2: Secondary | ICD-10-CM | POA: Diagnosis present

## 2023-07-13 DIAGNOSIS — Z882 Allergy status to sulfonamides status: Secondary | ICD-10-CM

## 2023-07-13 DIAGNOSIS — M7062 Trochanteric bursitis, left hip: Secondary | ICD-10-CM | POA: Diagnosis not present

## 2023-07-13 DIAGNOSIS — N132 Hydronephrosis with renal and ureteral calculous obstruction: Secondary | ICD-10-CM | POA: Diagnosis not present

## 2023-07-13 DIAGNOSIS — R1084 Generalized abdominal pain: Secondary | ICD-10-CM | POA: Diagnosis not present

## 2023-07-13 DIAGNOSIS — I5032 Chronic diastolic (congestive) heart failure: Secondary | ICD-10-CM | POA: Diagnosis present

## 2023-07-13 DIAGNOSIS — K4090 Unilateral inguinal hernia, without obstruction or gangrene, not specified as recurrent: Secondary | ICD-10-CM | POA: Diagnosis present

## 2023-07-13 DIAGNOSIS — Z79890 Hormone replacement therapy (postmenopausal): Secondary | ICD-10-CM

## 2023-07-13 DIAGNOSIS — Z9049 Acquired absence of other specified parts of digestive tract: Secondary | ICD-10-CM

## 2023-07-13 DIAGNOSIS — N3000 Acute cystitis without hematuria: Secondary | ICD-10-CM | POA: Diagnosis not present

## 2023-07-13 DIAGNOSIS — Z7902 Long term (current) use of antithrombotics/antiplatelets: Secondary | ICD-10-CM

## 2023-07-13 DIAGNOSIS — K5730 Diverticulosis of large intestine without perforation or abscess without bleeding: Secondary | ICD-10-CM | POA: Diagnosis present

## 2023-07-13 DIAGNOSIS — E782 Mixed hyperlipidemia: Secondary | ICD-10-CM

## 2023-07-13 DIAGNOSIS — N179 Acute kidney failure, unspecified: Secondary | ICD-10-CM | POA: Diagnosis not present

## 2023-07-13 DIAGNOSIS — K5900 Constipation, unspecified: Secondary | ICD-10-CM | POA: Diagnosis present

## 2023-07-13 DIAGNOSIS — I480 Paroxysmal atrial fibrillation: Secondary | ICD-10-CM | POA: Diagnosis not present

## 2023-07-13 DIAGNOSIS — K219 Gastro-esophageal reflux disease without esophagitis: Secondary | ICD-10-CM | POA: Diagnosis present

## 2023-07-13 DIAGNOSIS — Z860100 Personal history of colon polyps, unspecified: Secondary | ICD-10-CM

## 2023-07-13 DIAGNOSIS — F419 Anxiety disorder, unspecified: Secondary | ICD-10-CM | POA: Diagnosis present

## 2023-07-13 DIAGNOSIS — I700 Atherosclerosis of aorta: Secondary | ICD-10-CM | POA: Diagnosis present

## 2023-07-13 DIAGNOSIS — I2510 Atherosclerotic heart disease of native coronary artery without angina pectoris: Secondary | ICD-10-CM | POA: Diagnosis present

## 2023-07-13 HISTORY — PX: CYSTOSCOPY/RETROGRADE/URETEROSCOPY/STONE EXTRACTION WITH BASKET: SHX5317

## 2023-07-13 LAB — CBC WITH DIFFERENTIAL/PLATELET
Abs Immature Granulocytes: 0.12 10*3/uL — ABNORMAL HIGH (ref 0.00–0.07)
Basophils Absolute: 0.1 10*3/uL (ref 0.0–0.1)
Basophils Relative: 0 %
Eosinophils Absolute: 0.1 10*3/uL (ref 0.0–0.5)
Eosinophils Relative: 1 %
HCT: 42.7 % (ref 39.0–52.0)
Hemoglobin: 14.5 g/dL (ref 13.0–17.0)
Immature Granulocytes: 1 %
Lymphocytes Relative: 13 %
Lymphs Abs: 1.8 10*3/uL (ref 0.7–4.0)
MCH: 30.9 pg (ref 26.0–34.0)
MCHC: 34 g/dL (ref 30.0–36.0)
MCV: 90.9 fL (ref 80.0–100.0)
Monocytes Absolute: 0.7 10*3/uL (ref 0.1–1.0)
Monocytes Relative: 5 %
Neutro Abs: 11.1 10*3/uL — ABNORMAL HIGH (ref 1.7–7.7)
Neutrophils Relative %: 80 %
Platelets: 243 10*3/uL (ref 150–400)
RBC: 4.7 MIL/uL (ref 4.22–5.81)
RDW: 12.7 % (ref 11.5–15.5)
WBC: 13.8 10*3/uL — ABNORMAL HIGH (ref 4.0–10.5)
nRBC: 0 % (ref 0.0–0.2)

## 2023-07-13 LAB — COMPREHENSIVE METABOLIC PANEL WITH GFR
ALT: 27 U/L (ref 0–44)
AST: 24 U/L (ref 15–41)
Albumin: 4 g/dL (ref 3.5–5.0)
Alkaline Phosphatase: 54 U/L (ref 38–126)
Anion gap: 13 (ref 5–15)
BUN: 14 mg/dL (ref 8–23)
CO2: 21 mmol/L — ABNORMAL LOW (ref 22–32)
Calcium: 9.4 mg/dL (ref 8.9–10.3)
Chloride: 100 mmol/L (ref 98–111)
Creatinine, Ser: 0.89 mg/dL (ref 0.61–1.24)
GFR, Estimated: >60 mL/min (ref >60)
Glucose, Bld: 320 mg/dL — ABNORMAL HIGH (ref 70–99)
Potassium: 4.7 mmol/L (ref 3.5–5.1)
Sodium: 134 mmol/L — ABNORMAL LOW (ref 135–145)
Total Bilirubin: 0.7 mg/dL (ref 0.0–1.2)
Total Protein: 7.1 g/dL (ref 6.5–8.1)

## 2023-07-13 LAB — URINALYSIS, W/ REFLEX TO CULTURE (INFECTION SUSPECTED)
Bilirubin Urine: NEGATIVE
Glucose, UA: >=500 mg/dL — AB
Ketones, ur: NEGATIVE mg/dL
Nitrite: NEGATIVE
Protein, ur: 30 mg/dL — AB
Specific Gravity, Urine: 1.017 (ref 1.005–1.030)
WBC, UA: >50 WBC/hpf (ref 0–5)
pH: 5 (ref 5.0–8.0)

## 2023-07-13 LAB — SURGICAL PCR SCREEN
MRSA, PCR: NEGATIVE
Staphylococcus aureus: NEGATIVE

## 2023-07-13 LAB — CK: Total CK: 130 U/L (ref 49–397)

## 2023-07-13 LAB — GLUCOSE, CAPILLARY: Glucose-Capillary: 257 mg/dL — ABNORMAL HIGH (ref 70–99)

## 2023-07-13 LAB — LACTIC ACID, PLASMA: Lactic Acid, Venous: 1.4 mmol/L (ref 0.5–1.9)

## 2023-07-13 LAB — CBG MONITORING, ED: Glucose-Capillary: 298 mg/dL — ABNORMAL HIGH (ref 70–99)

## 2023-07-13 SURGERY — CYSTOSCOPY, RIGID, WITH STENT REPLACEMENT
Anesthesia: General | Laterality: Bilateral

## 2023-07-13 MED ORDER — SODIUM CHLORIDE 0.9 % IV SOLN
INTRAVENOUS | Status: AC
  Filled 2023-07-13: qty 20

## 2023-07-13 MED ORDER — AMISULPRIDE (ANTIEMETIC) 5 MG/2ML IV SOLN: 10.0000 mg | Freq: Once | INTRAVENOUS | Status: DC | PRN

## 2023-07-13 MED ORDER — IOHEXOL 350 MG/ML SOLN
75.0000 mL | Freq: Once | INTRAVENOUS | Status: AC | PRN
  Administered 2023-07-13: 75 mL via INTRAVENOUS

## 2023-07-13 MED ORDER — FENTANYL CITRATE (PF) 250 MCG/5ML IJ SOLN
INTRAMUSCULAR | Status: AC
Start: 2023-07-13 — End: ?
  Filled 2023-07-13: qty 5

## 2023-07-13 MED ORDER — FENTANYL CITRATE (PF) 100 MCG/2ML IJ SOLN: 25.0000 ug | INTRAMUSCULAR | Status: DC | PRN

## 2023-07-13 MED ORDER — ACETAMINOPHEN 325 MG PO TABS
650.0000 mg | ORAL_TABLET | Freq: Four times a day (QID) | ORAL | Status: DC | PRN
  Administered 2023-07-13 – 2023-07-17 (×7): 650 mg via ORAL
  Filled 2023-07-13 (×7): qty 2

## 2023-07-13 MED ORDER — FENTANYL CITRATE (PF) 250 MCG/5ML IJ SOLN
INTRAMUSCULAR | Status: DC | PRN
  Administered 2023-07-13: 50 ug via INTRAVENOUS

## 2023-07-13 MED ORDER — LACTATED RINGERS IV SOLN: INTRAVENOUS | Status: DC

## 2023-07-13 MED ORDER — PHENYLEPHRINE 80 MCG/ML (10ML) SYRINGE FOR IV PUSH (FOR BLOOD PRESSURE SUPPORT)
PREFILLED_SYRINGE | INTRAVENOUS | Status: AC
  Filled 2023-07-13: qty 10

## 2023-07-13 MED ORDER — SODIUM CHLORIDE 0.9 % IV SOLN: INTRAVENOUS | Status: DC

## 2023-07-13 MED ORDER — PHENYLEPHRINE 80 MCG/ML (10ML) SYRINGE FOR IV PUSH (FOR BLOOD PRESSURE SUPPORT)
PREFILLED_SYRINGE | INTRAVENOUS | Status: DC | PRN
  Administered 2023-07-13: 120 ug via INTRAVENOUS

## 2023-07-13 MED ORDER — DEXAMETHASONE SODIUM PHOSPHATE 10 MG/ML IJ SOLN
INTRAMUSCULAR | Status: AC
  Filled 2023-07-13: qty 1

## 2023-07-13 MED ORDER — ATORVASTATIN CALCIUM 80 MG PO TABS
80.0000 mg | ORAL_TABLET | Freq: Every day | ORAL | Status: DC
  Administered 2023-07-13 – 2023-07-17 (×5): 80 mg via ORAL
  Filled 2023-07-13 (×5): qty 1

## 2023-07-13 MED ORDER — MELATONIN 3 MG PO TABS
3.0000 mg | ORAL_TABLET | Freq: Every day | ORAL | Status: DC
  Administered 2023-07-13 – 2023-07-16 (×4): 3 mg via ORAL
  Filled 2023-07-13 (×5): qty 1

## 2023-07-13 MED ORDER — LIDOCAINE 2% (20 MG/ML) 5 ML SYRINGE
INTRAMUSCULAR | Status: DC | PRN
  Administered 2023-07-13: 40 mg via INTRAVENOUS

## 2023-07-13 MED ORDER — ONDANSETRON HCL 4 MG/2ML IJ SOLN
INTRAMUSCULAR | Status: DC | PRN
  Administered 2023-07-13: 4 mg via INTRAVENOUS

## 2023-07-13 MED ORDER — SODIUM CHLORIDE 0.9 % IV SOLN: INTRAVENOUS | Status: DC | PRN

## 2023-07-13 MED ORDER — PROPOFOL 10 MG/ML IV BOLUS
INTRAVENOUS | Status: AC
  Filled 2023-07-13: qty 20

## 2023-07-13 MED ORDER — ONDANSETRON HCL 4 MG/2ML IJ SOLN
INTRAMUSCULAR | Status: AC
  Filled 2023-07-13: qty 2

## 2023-07-13 MED ORDER — HYDROCODONE-ACETAMINOPHEN 5-325 MG PO TABS
1.0000 | ORAL_TABLET | ORAL | Status: DC | PRN
  Administered 2023-07-14: 1 via ORAL
  Administered 2023-07-15 – 2023-07-16 (×5): 2 via ORAL
  Filled 2023-07-13 (×2): qty 2
  Filled 2023-07-13: qty 1
  Filled 2023-07-13 (×3): qty 2

## 2023-07-13 MED ORDER — ACETAMINOPHEN 10 MG/ML IV SOLN: 1000.0000 mg | Freq: Once | INTRAVENOUS | Status: DC | PRN

## 2023-07-13 MED ORDER — LACTATED RINGERS IV BOLUS
1000.0000 mL | Freq: Once | INTRAVENOUS | Status: AC
  Administered 2023-07-13: 1000 mL via INTRAVENOUS

## 2023-07-13 MED ORDER — ACETAMINOPHEN 650 MG RE SUPP: 650.0000 mg | Freq: Four times a day (QID) | RECTAL | Status: DC | PRN

## 2023-07-13 MED ORDER — EPHEDRINE 5 MG/ML INJ
INTRAVENOUS | Status: AC
  Filled 2023-07-13: qty 5

## 2023-07-13 MED ORDER — CHLORHEXIDINE GLUCONATE 0.12 % MT SOLN
15.0000 mL | Freq: Once | OROMUCOSAL | Status: AC
  Administered 2023-07-13: 15 mL via OROMUCOSAL
  Filled 2023-07-13 (×2): qty 15

## 2023-07-13 MED ORDER — AMLODIPINE BESYLATE 5 MG PO TABS
5.0000 mg | ORAL_TABLET | Freq: Every day | ORAL | Status: DC
  Administered 2023-07-13 – 2023-07-17 (×5): 5 mg via ORAL
  Filled 2023-07-13 (×5): qty 1

## 2023-07-13 MED ORDER — SODIUM CHLORIDE 0.9 % IV SOLN
1.0000 g | INTRAVENOUS | Status: DC
  Administered 2023-07-14 – 2023-07-16 (×3): 1 g via INTRAVENOUS
  Filled 2023-07-13 (×4): qty 10

## 2023-07-13 MED ORDER — INSULIN ASPART 100 UNIT/ML IJ SOLN
8.0000 [IU] | Freq: Once | INTRAMUSCULAR | Status: AC
  Administered 2023-07-13: 8 [IU] via SUBCUTANEOUS

## 2023-07-13 MED ORDER — LEVOTHYROXINE SODIUM 50 MCG PO TABS
50.0000 ug | ORAL_TABLET | Freq: Every day | ORAL | Status: DC
  Administered 2023-07-14 – 2023-07-17 (×4): 50 ug via ORAL
  Filled 2023-07-13 (×4): qty 1

## 2023-07-13 MED ORDER — HYDROMORPHONE HCL 1 MG/ML IJ SOLN: 0.5000 mg | INTRAMUSCULAR | Status: DC | PRN

## 2023-07-13 MED ORDER — ORAL CARE MOUTH RINSE: 15.0000 mL | Freq: Once | OROMUCOSAL | Status: AC

## 2023-07-13 MED ORDER — PROPOFOL 10 MG/ML IV BOLUS
INTRAVENOUS | Status: DC | PRN
  Administered 2023-07-13: 100 mg via INTRAVENOUS

## 2023-07-13 MED ORDER — INSULIN ASPART 100 UNIT/ML IJ SOLN
0.0000 [IU] | INTRAMUSCULAR | Status: DC | PRN
  Administered 2023-07-13: 4 [IU] via SUBCUTANEOUS

## 2023-07-13 MED ORDER — ONDANSETRON HCL 4 MG/2ML IJ SOLN: 4.0000 mg | Freq: Once | INTRAMUSCULAR | Status: DC | PRN

## 2023-07-13 MED ORDER — ONDANSETRON HCL 4 MG/2ML IJ SOLN
4.0000 mg | Freq: Once | INTRAMUSCULAR | Status: AC
  Administered 2023-07-13: 4 mg via INTRAVENOUS
  Filled 2023-07-13: qty 2

## 2023-07-13 MED ORDER — OXYCODONE HCL 5 MG PO TABS
5.0000 mg | ORAL_TABLET | Freq: Once | ORAL | Status: AC
  Administered 2023-07-13: 5 mg via ORAL
  Filled 2023-07-13: qty 1

## 2023-07-13 MED ORDER — DEXAMETHASONE SODIUM PHOSPHATE 10 MG/ML IJ SOLN
INTRAMUSCULAR | Status: DC | PRN
  Administered 2023-07-13: 10 mg via INTRAVENOUS

## 2023-07-13 MED ORDER — SODIUM CHLORIDE 0.9 % IV SOLN
1.0000 g | Freq: Once | INTRAVENOUS | Status: AC
  Administered 2023-07-13: 1 g via INTRAVENOUS

## 2023-07-13 MED ORDER — CARVEDILOL 3.125 MG PO TABS
3.1250 mg | ORAL_TABLET | Freq: Two times a day (BID) | ORAL | Status: DC
  Administered 2023-07-14 – 2023-07-17 (×6): 3.125 mg via ORAL
  Filled 2023-07-13 (×6): qty 1

## 2023-07-13 MED ORDER — SUCCINYLCHOLINE CHLORIDE 200 MG/10ML IV SOSY
PREFILLED_SYRINGE | INTRAVENOUS | Status: DC | PRN
  Administered 2023-07-13: 100 mg via INTRAVENOUS

## 2023-07-13 MED ORDER — KETOROLAC TROMETHAMINE 30 MG/ML IJ SOLN
30.0000 mg | Freq: Once | INTRAMUSCULAR | Status: AC
  Administered 2023-07-13: 30 mg via INTRAVENOUS
  Filled 2023-07-13: qty 1

## 2023-07-13 MED ORDER — LIDOCAINE 2% (20 MG/ML) 5 ML SYRINGE
INTRAMUSCULAR | Status: AC
  Filled 2023-07-13: qty 5

## 2023-07-13 SURGICAL SUPPLY — 25 items
BAG DRAIN URO-CYSTO SKYTR STRL (DRAIN) ×1 IMPLANT
BASKET ZERO TIP NITINOL 2.4FR (BASKET) IMPLANT
BENZOIN TINCTURE PRP APPL 2/3 (GAUZE/BANDAGES/DRESSINGS) IMPLANT
CATH URETL OPEN END 6FR 70 (CATHETERS) ×1 IMPLANT
CLOTH BEACON ORANGE TIMEOUT ST (SAFETY) ×1 IMPLANT
DRSG TEGADERM 4X4.75 (GAUZE/BANDAGES/DRESSINGS) IMPLANT
ELECT REM PT RETURN 9FT ADLT (ELECTROSURGICAL) IMPLANT
ELECTRODE REM PT RTRN 9FT ADLT (ELECTROSURGICAL) IMPLANT
FIBER LASER FLEXIVA 365 (UROLOGICAL SUPPLIES) IMPLANT
FIBER LASER TRACTIP 200 (UROLOGICAL SUPPLIES) IMPLANT
GLOVE BIO SURGEON STRL SZ7 (GLOVE) ×1 IMPLANT
GOWN STRL REUS W/ TWL LRG LVL3 (GOWN DISPOSABLE) ×1 IMPLANT
GUIDEWIRE STR DUAL SENSOR (WIRE) ×1 IMPLANT
GUIDEWIRE ZIPWRE .038 STRAIGHT (WIRE) IMPLANT
KIT TURNOVER KIT B (KITS) ×1 IMPLANT
MANIFOLD NEPTUNE II (INSTRUMENTS) ×1 IMPLANT
NS IRRIG 500ML POUR BTL (IV SOLUTION) ×1 IMPLANT
PACK CYSTO (CUSTOM PROCEDURE TRAY) ×1 IMPLANT
SHEATH NAVIGATOR HD 11/13X36 (SHEATH) IMPLANT
SLEEVE SCD COMPRESS KNEE MED (STOCKING) ×1 IMPLANT
SOL .9 NS 3000ML IRR UROMATIC (IV SOLUTION) ×2 IMPLANT
STENT CONTOUR 6FRX24X.038 (STENTS) IMPLANT
STENT URET 6FRX26 CONTOUR (STENTS) IMPLANT
TUBE CONNECTING 12X1/4 (SUCTIONS) IMPLANT
TUBING UROLOGY SET (TUBING) ×1 IMPLANT

## 2023-07-13 NOTE — Progress Notes (Signed)
 Call received from Dr. Adela Glimpse to see how patient is doing, she is aware of patients blood pressure and history of hypertension. No new orders for blood pressure management at this time. She will place admission orders for patient. Patient is awake, alert, oriented and denies pain.

## 2023-07-13 NOTE — H&P (Signed)
 [name] KGU:[id number] DOB: Nov 09, 1945 DOA: 07/13/2023     PCP: Lonie Peak, PA-C   Outpatient Specialists: CARDS: Dr. Thomasene Ripple, DO  Patient arrived to ER on 07/13/23 at 1232 Referred by Attending No att. providers found   Patient coming from:   From facility [facility]    Chief Complaint:  Chief Complaint  Patient presents with   Flank Pain   Nausea   Emesis    HPI: [name] is a 78 y.o. male with medical history significant of DM2, Spinal stenosis, HTN, HLD, CAD, paroxysmal A.fib, hypothyrodism, hx of septic Shock  due to UTi , kidney stones, chronic back pain, ataxia, GERD, HOH    Presented with flank pain fever chills  Patient came in from [facility] complaint nausea vomiting and abdominal pain severe.  Recently was diagnosed with UTI Also had steroid injections in the left hip. Started to have fevers and severe flank pain presented to emergency department found to have bilateral ureteral stones with obstruction and was emergently taken to the OR  CT scan showed 1.5 cm stones in the right UPJ and 1.2 cm stone in the left proximal ureter bilateral hydro left worse than right Left flank pain worse than right Evidence of UTI   Denies significant ETOH intake   Does not smoke   Lab Results  Component Value Date   SARSCOV2NAA NEGATIVE 08/01/2022   SARSCOV2NAA NEGATIVE 06/04/2022      Regarding pertinent Chronic problems:  Hyperlipidemia - on statins Lipitor (atorvastatin)  Lipid Panel     Component Value Date/Time   CHOL 99 06/05/2022 0510   CHOL 120 11/22/2021 0855   TRIG 87 06/05/2022 0510   HDL 36 (L) 06/05/2022 0510   HDL 34 (L) 11/22/2021 0855   CHOLHDL 2.8 06/05/2022 0510   VLDL 17 06/05/2022 0510   LDLCALC 46 06/05/2022 0510   LDLCALC 57 11/22/2021 0855   LABVLDL 29 11/22/2021 0855    HTN on coreg, norvasc, losartan  chronic CHF diastolic - last echo Recent Results (from the past 62831 hours)  ECHOCARDIOGRAM COMPLETE   Collection  Time: 12/04/21  9:33 AM  Result Value   S' Lateral 3.40   Area-P 1/2 3.70   Narrative      ECHOCARDIOGRAM REPORT       Patient Name:   [name] Date of Exam: 12/04/2021 Medical Rec #:  [id number]      Height:       69.0 in Accession #:    [id number]     Weight:       239.0 lb Date of Birth:  [birth year]      BSA:          2.229 m Patient Age:    75 years       BP:           130/80 mmHg Patient Gender: M              HR:           69 bpm. Exam Location:  [facility]  Procedure: 2D Echo, Cardiac Doppler, Color Doppler and Strain Analysis  Indications:    Coronary artery disease involving native coronary artery of                 native heart without angina pectoris [I25.10 (ICD-10-CM)];                 Dyspnea on exertion [R06.09 (ICD-10-CM)]   History:  Patient has no prior history of Echocardiogram examinations.                 CAD, Arrythmias:PAT (paroxysmal atrial tachycardia),                 Signs/Symptoms:Chest Pain, Dizziness/Lightheadedness, Obesity,                 unspecified and Fatigue; Risk Factors:Hypertension and Diabetes.   Sonographer:    Louie Boston RDCS Referring Phys: [phone] Marveen Reeks KRASOWSKI  IMPRESSIONS    1. Left ventricular ejection fraction, by estimation, is 60 to 65%. The left ventricle has normal function. The left ventricle has no regional wall motion abnormalities. Left ventricular diastolic parameters are consistent with Grade II diastolic  dysfunction (pseudonormalization).  2. Right ventricular systolic function is normal. The right ventricular size is normal. There is normal pulmonary artery systolic pressure.  3. The mitral valve is normal in structure. No evidence of mitral valve regurgitation. No evidence of mitral stenosis.  4. The aortic valve is normal in structure. Aortic valve regurgitation is not visualized. No aortic stenosis is present.  5. The inferior vena cava is normal in size with greater than 50% respiratory variability,  suggesting right atrial pressure of 3 mmHg.          CAD  - On Aspirin, statin, betablocker, Plavix                 -  followed by cardiology                - last cardiac cath  Stent placed in 99 and then 2013 Last cardiac catheterization done 2021    DM 2 -  Lab Results  Component Value Date   HGBA1C 8.1 (H) 06/05/2022   on PO meds only,    Hypothyroidism:   Lab Results  Component Value Date   TSH 4.633 (H) 06/10/2022   on synthroid    obesity-   BMI Readings from Last 1 Encounters:  07/13/23 36.48 kg/m    A. Fib? -   atrial fibrillation CHA2DS2 vas score    5  Has been in sinus rhythm    Not on anticoagulation secondary to Risk of Falls,         -  Rate control:  Currently controlled with Coreg        Chronic anemia - baseline hg Hemoglobin & Hematocrit  Recent Labs    08/04/22 0448 08/07/22 0615 07/13/23 1319  HGB 8.2* 9.5* 14.5   Iron/TIBC/Ferritin/ %Sat No results found for: "IRON", "TIBC", "FERRITIN", "IRONPCTSAT"   While in ER:     Found to have bilateral kidney stones was taken to the OR by urology started on Rocephin    Lab Orders         Urine Culture         Urine Culture (for pregnant, neutropenic or urologic patients or patients with an indwelling urinary catheter)         Surgical pcr screen         Comprehensive metabolic panel         CBC with Differential         Urinalysis, w/ Reflex to Culture (Infection Suspected) -Urine, Clean Catch         Glucose, capillary         CBG monitoring, ED        CTabd/pelvis - 1. 7 mm left proximal ureteral calculus with associated mild-to-moderate left hydronephrosis.  2. 10 mm right UPJ calculus with minimal hydronephrosis. 3. Minimal diffuse bladder wall thickening with minimal progression. This is likely due to chronic bladder outlet obstruction by the patient's enlarged prostate gland. 4. Moderately enlarged prostate gland. 5.  Calcific coronary artery and aortic atherosclerosis. 6.  Sigmoid and descending colon diverticulosis. 7. Small left inguinal hernia containing fat.   Following Medications were ordered in ER: Medications  HYDROmorphone (DILAUDID) injection 0.5 mg ( Intravenous MAR Hold 07/13/23 1725)  lactated ringers infusion ( Intravenous New Bag/Given 07/13/23 1739)  insulin aspart (novoLOG) injection 0-7 Units (4 Units Subcutaneous Given 07/13/23 1738)  fentaNYL (SUBLIMAZE) injection 25-50 mcg (has no administration in time range)  ondansetron (ZOFRAN) injection 4 mg (has no administration in time range)  amisulpride (BARHEMSYS) injection 10 mg (has no administration in time range)  acetaminophen (OFIRMEV) IV 1,000 mg (has no administration in time range)  sodium chloride 0.9 % with cefTRIAXone (ROCEPHIN) ADS Med (has no administration in time range)  ketorolac (TORADOL) 30 MG/ML injection 30 mg (30 mg Intravenous Given 07/13/23 1330)  ondansetron (ZOFRAN) injection 4 mg (4 mg Intravenous Given 07/13/23 1330)  iohexol (OMNIPAQUE) 350 MG/ML injection 75 mL (75 mLs Intravenous Contrast Given 07/13/23 1542)  oxyCODONE (Oxy IR/ROXICODONE) immediate release tablet 5 mg (5 mg Oral Given 07/13/23 1607)  lactated ringers bolus 1,000 mL (1,000 mLs Intravenous New Bag/Given 07/13/23 1610)  cefTRIAXone (ROCEPHIN) 1 g in sodium chloride 0.9 % 100 mL IVPB (1 g Intravenous New Bag/Given 07/13/23 1811)  chlorhexidine (PERIDEX) 0.12 % solution 15 mL (15 mLs Mouth/Throat Given 07/13/23 1740)    Or  Oral care mouth rinse ( Mouth Rinse See Alternative 07/13/23 1740)    _______________________________________________________ ER Provider Called:    Urology   Dr. Lafonda Mosses They Recommend admit to medicine    SEEN in ER     ED Triage Vitals  Encounter Vitals Group     BP 07/13/23 1232 (!) 182/77     Systolic BP Percentile --      Diastolic BP Percentile --      Pulse Rate 07/13/23 1232 69     Resp 07/13/23 1232 18     Temp 07/13/23 1232 97.8 F (36.6 C)     Temp Source 07/13/23  1611 Oral     SpO2 07/13/23 1232 93 %     Weight 07/13/23 1241 247 lb (112 kg)     Height 07/13/23 1241 5\' 9"  (1.753 m)     Head Circumference --      Peak Flow --      Pain Score 07/13/23 1239 10     Pain Loc --      Pain Education --      Exclude from Growth Chart --   FAOZ(30)@     _________________________________________ Significant initial  Findings: Abnormal Labs Reviewed  COMPREHENSIVE METABOLIC PANEL WITH GFR - Abnormal; Notable for the following components:      Result Value   Sodium 134 (*)    CO2 21 (*)    Glucose, Bld 320 (*)    All other components within normal limits  CBC WITH DIFFERENTIAL/PLATELET - Abnormal; Notable for the following components:   WBC 13.8 (*)    Neutro Abs 11.1 (*)    Abs Immature Granulocytes 0.12 (*)    All other components within normal limits  URINALYSIS, W/ REFLEX TO CULTURE (INFECTION SUSPECTED) - Abnormal; Notable for the following components:   APPearance HAZY (*)    Glucose, UA >=500 (*)  Hgb urine dipstick SMALL (*)    Protein, ur 30 (*)    Leukocytes,Ua LARGE (*)    Bacteria, UA RARE (*)    All other components within normal limits  GLUCOSE, CAPILLARY - Abnormal; Notable for the following components:   Glucose-Capillary 257 (*)    All other components within normal limits  CBG MONITORING, ED - Abnormal; Notable for the following components:   Glucose-Capillary 298 (*)    All other components within normal limits    _________________________ Troponin  Cardiac Panel (last 3 results) Recent Labs    07/13/23 1318  CKTOTAL 130     ECG: Ordered   The recent clinical data is shown below. Vitals:   07/13/23 1732 07/13/23 1742 07/13/23 1857 07/13/23 1900  BP:  (!) 161/83 127/69 (!) 149/83  Pulse:   84 81  Resp:   12 (!) 24  Temp: 99 F (37.2 C)  98.6 F (37 C)   TempSrc:      SpO2:   98% 99%  Weight:      Height:        WBC     Component Value Date/Time   WBC 13.8 (H) 07/13/2023 1319   LYMPHSABS 1.8  07/13/2023 1319   LYMPHSABS 1.6 09/23/2019 1020   MONOABS 0.7 07/13/2023 1319   EOSABS 0.1 07/13/2023 1319   EOSABS 0.3 09/23/2019 1020   BASOSABS 0.1 07/13/2023 1319   BASOSABS 0.1 09/23/2019 1020     Lactic Acid, Venous    Component Value Date/Time   LATICACIDVEN 1.8 08/02/2022 0608      UA   evidence of UTI    Urine analysis:    Component Value Date/Time   COLORURINE YELLOW 07/13/2023 1319   APPEARANCEUR HAZY (A) 07/13/2023 1319   LABSPEC 1.017 07/13/2023 1319   PHURINE 5.0 07/13/2023 1319   GLUCOSEU >=500 (A) 07/13/2023 1319   HGBUR SMALL (A) 07/13/2023 1319   BILIRUBINUR NEGATIVE 07/13/2023 1319   KETONESUR NEGATIVE 07/13/2023 1319   PROTEINUR 30 (A) 07/13/2023 1319   UROBILINOGEN 0.2 10/23/2011 0731   NITRITE NEGATIVE 07/13/2023 1319   LEUKOCYTESUR LARGE (A) 07/13/2023 1319    Results for orders placed or performed during the hospital encounter of 07/13/23  Surgical pcr screen     Status: None   Collection Time: 07/13/23  5:31 PM   Specimen: Nasal Mucosa; Nasal Swab  Result Value Ref Range Status   MRSA, PCR NEGATIVE NEGATIVE Final   Staphylococcus aureus NEGATIVE NEGATIVE Final    Comment: (NOTE) The Xpert SA Assay (FDA approved for NASAL specimens in patients 75 years of age and older), is one component of a comprehensive surveillance program. It is not intended to diagnose infection nor to guide or monitor treatment. Performed at [facility], [address], [place], [place] [address]     ABX started Antibiotics Given (last 72 hours)     Date/Time Action Medication Dose   07/13/23 1811 New Bag/Given   [MAR Hold] cefTRIAXone (ROCEPHIN) 1 g in sodium chloride 0.9 % 100 mL IVPB MAR Hold since Mon 07/13/2023 at 1725.Hold Reason: Transfer to a Procedural area 1 g       __________________________________________________________ Recent Labs  Lab 07/13/23 1319  NA 134*  K 4.7  CO2 21*  GLUCOSE 320*  BUN 14  CREATININE 0.89  CALCIUM 9.4     Cr    stable,   Lab Results  Component Value Date   CREATININE 0.89 07/13/2023   CREATININE 0.62 08/07/2022  CREATININE 0.77 08/04/2022    Recent Labs  Lab 07/13/23 1319  AST 24  ALT 27  ALKPHOS 54  BILITOT 0.7  PROT 7.1  ALBUMIN 4.0   Lab Results  Component Value Date   CALCIUM 9.4 07/13/2023    Plt: Lab Results  Component Value Date   PLT 243 07/13/2023      Recent Labs  Lab 07/13/23 1319  WBC 13.8*  NEUTROABS 11.1*  HGB 14.5  HCT 42.7  MCV 90.9  PLT 243    HG/HCT  stable,      Component Value Date/Time   HGB 14.5 07/13/2023 1319   HGB 13.5 09/23/2019 1020   HCT 42.7 07/13/2023 1319   HCT 39.9 09/23/2019 1020   MCV 90.9 07/13/2023 1319   MCV 89 09/23/2019 1020    _______________________________________________ Hospitalist was called for admission for UTI in the setting of bilateral ureteral stones with hydronephrosis   The following Work up has been ordered so far:  Orders Placed This Encounter  Procedures   Urine Culture   Urine Culture (for pregnant, neutropenic or urologic patients or patients with an indwelling urinary catheter)   Surgical pcr screen   CT ABDOMEN PELVIS W CONTRAST   DG C-Arm 1-60 Min   Comprehensive metabolic panel   CBC with Differential   Urinalysis, w/ Reflex to Culture (Infection Suspected) -Urine, Clean Catch   Glucose, capillary   Vital signs Per Protocol   Notify physician (specify)   Diet Per Protocol   Pre-admission testing diagnosis   Medication Instructions Per Protocol   Discharge instructions   Void   IV Fluids Per Protocol   May use Lidocaine 1% - 2% subcutaneously prior to IV start if patient not allergic to Lidocaine   Follow Diabetes Medication Adjustment Guidelines Prior to Procedure and Surgery   Anesthesia Preoperative Order   Discharge  per PACU criteria   Vital signs   Cardiac monitoring   Notify physician (specify) Notify physician for pulse less than 60 or greater than 150,  respiratory rate less than 12 or greater than 35, temperature greater than 38.5, urinary output less than .5 mL/kg/hr, systolic BP less than 50 or greater than...   Notify physician (specify) For blood glucose <90 or >250 (after administering insulin).   Notify physician (specify)   Consult to urology   Consult for [facility] Admission   Continuous pulse oximetry   Oxygen therapy Mode or (Route): Nasal cannula; Keep O2 saturation between: > 92%   Pulse oximetry, continuous   Oxygen therapy Mode or (Route): Nasal cannula   CBG monitoring, ED   Insert and maintain IV line     OTHER Significant initial  Findings:  labs showing:     DM  labs:  HbA1C: No results for input(s): "HGBA1C" in the last 8760 hours.     CBG (last 3)  Recent Labs    07/13/23 1318 07/13/23 1858  GLUCAP 298* 257*          Cultures:    Component Value Date/Time   SDES URINE, CLEAN CATCH 08/01/2022 2014   SPECREQUEST  08/01/2022 2014    NONE Performed at [facility], [address] [place], [place] [address]    CULT 30,000 COLONIES/mL KLEBSIELLA PNEUMONIAE (A) 08/01/2022 2014   REPTSTATUS 08/04/2022 FINAL 08/01/2022 2014     Radiological Exams on Admission: CT ABDOMEN PELVIS W CONTRAST Result Date: 07/13/2023 CLINICAL DATA:  Left flank and lower abdominal pain, nausea and vomiting. EXAM: CT ABDOMEN  AND PELVIS WITH CONTRAST TECHNIQUE: Multidetector CT imaging of the abdomen and pelvis was performed using the standard protocol following bolus administration of intravenous contrast. RADIATION DOSE REDUCTION: This exam was performed according to the departmental dose-optimization program which includes automated exposure control, adjustment of the mA and/or kV according to patient size and/or use of iterative reconstruction technique. CONTRAST:  75mL OMNIPAQUE IOHEXOL 350 MG/ML SOLN COMPARISON:  08/01/2022 FINDINGS: Lower chest: Atheromatous calcifications, including the coronary arteries and  aorta. Normal-sized heart. Clear lung bases. Hepatobiliary: No focal liver abnormality is seen. Status post cholecystectomy. No biliary dilatation. Pancreas: Unremarkable. No pancreatic ductal dilatation or surrounding inflammatory changes. Spleen: Normal in size without focal abnormality. Adrenals/Urinary Tract: Normal-appearing adrenal glands. 10 mm right UPJ calculus minimal hydronephrosis. 7 mm right proximal ureteral calculus with associated mild-to-moderate left hydronephrosis. No other urinary tract calculi. Minimal diffuse bladder wall thickening with minimal progression. Stomach/Bowel: Sigmoid and descending colon diverticulosis without evidence of diverticulitis. Unremarkable stomach, small bowel and appendix. Vascular/Lymphatic: Atheromatous arterial calcifications without aneurysm. No enlarged lymph nodes. Reproductive: Moderately enlarged prostate gland. Associated mild protrusion of the median lobe into the base of the urinary bladder. Other: Small left inguinal hernia containing fat. Musculoskeletal: Moderate to marked lumbar and lower thoracic spine degenerative changes. Interbody and pedicle screw and rod fixation at the L4 through S1 levels. Normal alignment. IMPRESSION: 1. 7 mm left proximal ureteral calculus with associated mild-to-moderate left hydronephrosis. 2. 10 mm right UPJ calculus with minimal hydronephrosis. 3. Minimal diffuse bladder wall thickening with minimal progression. This is likely due to chronic bladder outlet obstruction by the patient's enlarged prostate gland. 4. Moderately enlarged prostate gland. 5.  Calcific coronary artery and aortic atherosclerosis. 6. Sigmoid and descending colon diverticulosis. 7. Small left inguinal hernia containing fat. Aortic Atherosclerosis (ICD10-I70.0). Electronically Signed   By: Beckie Salts M.D.   On: 07/13/2023 16:29   _______________________________________________________________________________________________________ Latest  Blood  pressure (!) 149/83, pulse 81, temperature 98.6 F (37 C), resp. rate (!) 24, height 5\' 9"  (1.753 m), weight 112 kg, SpO2 99%.   Vitals  labs and radiology finding personally reviewed  Review of Systems:    Pertinent positives include: ***  Constitutional:  No weight loss, night sweats, Fevers, chills, fatigue, weight loss  HEENT:  No headaches, Difficulty swallowing,Tooth/dental problems,Sore throat,  No sneezing, itching, ear ache, nasal congestion, post nasal drip,  Cardio-vascular:  No chest pain, Orthopnea, PND, anasarca, dizziness, palpitations.no Bilateral lower extremity swelling  GI:  No heartburn, indigestion, abdominal pain, nausea, vomiting, diarrhea, change in bowel habits, loss of appetite, melena, blood in stool, hematemesis Resp:  no shortness of breath at rest. No dyspnea on exertion, No excess mucus, no productive cough, No non-productive cough, No coughing up of blood.No change in color of mucus.No wheezing. Skin:  no rash or lesions. No jaundice GU:  no dysuria, change in color of urine, no urgency or frequency. No straining to urinate.  No flank pain.  Musculoskeletal:  No joint pain or no joint swelling. No decreased range of motion. No back pain.  Psych:  No change in mood or affect. No depression or anxiety. No memory loss.  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 level with radiculopathy  06/09/2022   Cervical radiculopathy at C5 06/10/2022   Chest pain 07/30/2011   1999 Cath - <50% stenosis 2008 NUC - low risk    Chronic back pain    herniated disc   Coronary artery disease    takes Plavix and ASA daily   Depression    takes Prozac daily   Diabetes mellitus type 2, controlled, with complications (HCC) 07/30/2011   Displacement of lumbar intervertebral disc without myelopathy 05/30/2014   Diverticulitis    hx of   Dizziness    occasionally and states Dr.Smith is aware   DJD (degenerative joint disease)    Essential hypertension, benign 07/30/2011   Fatigue 09/06/2019   GERD (gastroesophageal reflux disease)    takes an OTC antacid   Gout    yrs ago and doesn't take any meds   Hard of hearing    wears hearing aids   Headache(784.0)    occasionally   History of colon polyps    benign   History of fusion of cervical spine 09/04/2020   Last Assessment & Plan:   Formatting of this note might be different from the original.  Review of recent cervical spine CT on care everywhere notes ACDF from C5-7 without hardware complication.  At this time he is doing well without any symptomatic issues.   History of kidney stones    HNP (herniated nucleus pulposus), lumbar 06/14/2014   Hyperlipidemia    takes Atorvastatin daily   Hypertension    takes Amlodipine,Losartan,and Coreg daily   Hypothyroidism    takes Synthroid daily   IBS (irritable bowel syndrome)    Lumbar post-laminectomy syndrome 09/04/2020   Last Assessment & Plan:   Formatting of this note might be different from the original.  78 year old male with chronic low back pain history of multiple lumbar spine surgeries, he has had a multilevel decompression and is fused from L4 to the sacrum.  His lumbar MRI completed in 2017 shows improvement compared with prior status post left L3-4 diskectomy in March of 2016 there is mild residual mass   Nontraumatic complete tear of right rotator cuff 06/09/2022   Obesity,  unspecified 07/30/2011   Other spondylosis, lumbar region 02/12/2021   Pain medication agreement 09/04/2020   Last Assessment & Plan:   Formatting of this note might be different from the original.  Agreement up-to-date.  Patient and I have discussed the hazardous effects of continued opiate pain medication usage. Risks and benefits of above medications including but not limited to possibility of respiratory depression, sedation, and even death were discussed with the patient who expressed an understandin   PAT (paroxysmal atrial tachycardia) (HCC) 10/12/2020   Preoperative cardiovascular examination 09/06/2019   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   Right sided weakness 06/04/2022   Shortness of breath dyspnea    with exertion   Swelling of lower limb 06/20/2014   Tuberculosis    Patient denies Tb   Type II diabetes mellitus (HCC)    takes Metformin daily   Unstable angina (HCC) 12/27/2013   Weakness    numbness and tingling in left leg      Past Surgical History:  Procedure Laterality Date   ANTERIOR CERVICAL DECOMP/DISCECTOMY FUSION  04/2007   C4-5; C6-7   BACK SURGERY     "3  times; last time was screws in lower back 10/2008"   CHOLECYSTECTOMY  1970's   COLONOSCOPY     CORONARY ANGIOPLASTY  1999/2013   1 stent   CYSTOSCOPY     LEFT HEART CATH AND CORONARY ANGIOGRAPHY N/A 09/30/2019   Procedure: LEFT HEART CATH AND CORONARY ANGIOGRAPHY;  Surgeon: Corky Crafts, MD;  Location: [facility] INVASIVE CV LAB;  Service: Cardiovascular;  Laterality: N/A;   LEFT HEART CATHETERIZATION WITH CORONARY ANGIOGRAM N/A 08/21/2011   Procedure: LEFT HEART CATHETERIZATION WITH CORONARY ANGIOGRAM;  Surgeon: Lesleigh Noe, MD;  Location: [facility] CATH LAB;  Service: Cardiovascular;  Laterality: N/A;   LITHOTRIPSY     LUMBAR LAMINECTOMY/DECOMPRESSION MICRODISCECTOMY Left 06/14/2014   Procedure: LEFT LUMBAR THREE-FOUR LANINOTOMY AND MICRODISKECTOMY.;  Surgeon: Hewitt Shorts, MD;   Location: [facility] NEURO ORS;  Service: Neurosurgery;  Laterality: Left;  left   PCI  5/09/2-13   LAD   RADIOLOGY WITH ANESTHESIA N/A 11/13/2015   Procedure: MRI OF LUMBAR SPINE W/WO CONTRAST    (RADIOLOGY WITH ANESTHESIA);  Surgeon: Medication Radiologist, MD;  Location: [facility] OR;  Service: Radiology;  Laterality: N/A;   RADIOLOGY WITH ANESTHESIA N/A 06/05/2022   Procedure: MRI WITH ANESTHESIA;  Surgeon: Radiologist, Medication, MD;  Location: [facility] OR;  Service: Radiology;  Laterality: N/A;   RADIOLOGY WITH ANESTHESIA N/A 06/08/2022   Procedure: MRI WITH ANESTHESIA;  Surgeon: Radiologist, Medication, MD;  Location: [facility] OR;  Service: Radiology;  Laterality: N/A;   SHOULDER ARTHROSCOPY W/ ROTATOR CUFF REPAIR  ~ 2011   left    Social History:  Ambulatory  walker  wheelchair bound    reports that he has quit smoking. His smokeless tobacco use includes chew. He reports that he does not drink alcohol and does not use drugs.  Family History: Family History  Problem Relation Age of Onset   Cancer Father        colon   Arthritis Mother    Healthy Sister    Heart attack Brother    Heart disease Brother    Hypertension Brother    Healthy Sister    ______________________________________________________________________________________________ Allergies: Allergies  Allergen Reactions   Misc. Sulfonamide Containing Compounds Rash   Sulfa Antibiotics Rash     Prior to Admission medications   Medication Sig Start Date End Date Taking? Authorizing Provider  acetaminophen (TYLENOL) 500 MG tablet Take 1,000 mg by mouth every 6 (six) hours as needed for mild pain.     [provider]  amLODipine (NORVASC) 5 MG tablet Take 1 tablet (5 mg total) by mouth daily. 08/08/22   Lorin Glass, MD  aspirin 81 MG tablet Take 81 mg by mouth daily.    [provider]  atorvastatin (LIPITOR) 80 MG tablet Take 80 mg by mouth daily. 11/01/21   [provider]  carvedilol (COREG) 3.125 MG tablet  Take 1 tablet (3.125 mg total) by mouth 2 (two) times daily with a meal. 06/10/22   Lyndle Herrlich, MD  clopidogrel (PLAVIX) 75 MG tablet Take 75 mg by mouth daily with breakfast.    [provider]  diclofenac Sodium (VOLTAREN) 1 % GEL Apply 4 g topically 4 (four) times daily. 06/10/22   Lyndle Herrlich, MD  docusate sodium (COLACE) 100 MG capsule Take 1 capsule (100 mg total) by mouth 2 (two) times daily as needed for mild constipation. 08/07/22   Dahal, Melina Schools, MD  insulin aspart (NOVOLOG) 100 UNIT/ML injection Inject 0-9 Units into the skin every 4 (four) hours. 08/07/22  Lorin Glass, MD  levothyroxine (SYNTHROID, LEVOTHROID) 50 MCG tablet Take 50 mcg by mouth daily before breakfast. 02/26/15   [provider]  lidocaine (LIDODERM) 5 % Place 1 patch onto the skin daily. Remove & Discard patch within 12 hours or as directed by MD 06/10/22   Lyndle Herrlich, MD  melatonin 3 MG TABS tablet Take 1 tablet (3 mg total) by mouth at bedtime. 06/10/22   Lyndle Herrlich, MD  metFORMIN (GLUCOPHAGE-XR) 500 MG 24 hr tablet Take 1 tablet (500 mg total) by mouth 2 (two) times daily. 10/02/19   Corky Crafts, MD  methocarbamol (ROBAXIN) 750 MG tablet Take 1 tablet (750 mg total) by mouth every 8 (eight) hours as needed for muscle spasms. 08/07/22   Lorin Glass, MD  nitroGLYCERIN (NITROSTAT) 0.4 MG SL tablet Place 0.4 mg under the tongue every 5 (five) minutes as needed for chest pain.    [provider]  polyethylene glycol powder (GLYCOLAX/MIRALAX) 17 GM/SCOOP powder Take 17 g by mouth in the morning.    [provider]  sertraline (ZOLOFT) 100 MG tablet Take 1 tablet (100 mg total) by mouth at bedtime. 06/10/22   Lyndle Herrlich, MD    ___________________________________________________________________________________________________ Physical Exam:    07/13/2023    7:00 PM 07/13/2023    6:57 PM 07/13/2023    5:42 PM  Vitals with BMI   Systolic 149 127 621  Diastolic 83 69 83  Pulse 81 84    1. General:  in No Acute distress Chronically ill -appearing 2. Psychological: Alert and Oriented 3. Head/ENT:   Dry Mucous Membranes                          Head Non traumatic, neck supple                          Poor Dentition 4. SKIN: decreased Skin turgor,  Skin clean Dry and intact no rash    5. Heart: Regular rate and rhythm no*** Murmur, no Rub or gallop 6. Lungs:  no wheezes or crackles   7. Abdomen: Soft, ***non-tender, Non distended *** obese ***bowel sounds present 8. Lower extremities: no clubbing, cyanosis, no ***edema 9. Neurologically Grossly intact, moving all 4 extremities equally  10. MSK: Normal range of motion    Chart has been reviewed  __________________________________________________________________  Assessment/Plan 78 y.o. male with medical history significant of DM2, Spinal stenosis, HTN, HLD, CAD, paroxysmal A.fib, hypothyrodism, hx of septic Shock  due to UTi , kidney stones, chronic back pain, ataxia, GERD, HOH   Admitted for  Lower urinary tract infectious disease with ureteral stones now sp stent placement   Present on Admission:  Bilateral ureteral calculi  CAD in native artery  Chronic back pain  Diabetes mellitus type 2, controlled, with complications (HCC)  Essential hypertension, benign  Hyperlipidemia  PAT (paroxysmal atrial tachycardia) (HCC)  UTI (urinary tract infection)    CAD in native artery Hold aspirin and Plavix until safe to resume by urology.  Resume Coreg 3.125 mg p.o. twice daily Resume Lipitor 80 mg daily  Bilateral ureteral calculi Appreciate urology consult patient was taken emergently to the OR with bilateral stents placed.  Continue Rocephin defer to urology regarding follow-up and management  Chronic back pain Chronic continue supportive management  Diabetes mellitus type 2, controlled, with complications (HCC) Hold p.o. medications order sliding  scale  Essential hypertension, benign Resume Norvasc 5 mg  a day and resume Coreg 3.125 mg p.o. twice a day  Hyperlipidemia Continue Lipitor 80 mg a day  PAT (paroxysmal atrial tachycardia) (HCC) Obtain EKG for baseline. Patient patient with history of A-fib versus PAT. Not on anticoagulation. Continue Coreg at home dose  UTI (urinary tract infection)  - treat with Rocephin         await results of urine culture and adjust antibiotic coverage as needed    Other plan as per orders.  DVT prophylaxis:  SCD       Code Status:    Code Status: Prior FULL CODE as per patient   I had personally discussed CODE STATUS with patient  ACP   none    Family Communication:   Family not at  Bedside  plan of care was discussed on the phone with  Sister  Diet  diabetic diet   Disposition Plan:    To home once workup is complete and patient is stable   Following barriers for discharge:                                                     Pain controlled with PO medications                               Afebrile, white count improving able to transition to PO antibiotics                            Will need consultants to evaluate patient prior to discharge                         Consults called: URology   Treatment Team:  Despina Arias, MD  Admission status:  ED Disposition     ED Disposition  To OR/IR/Endo...   Condition  --   Comment  --          Obs     Level of care      progressive         Fue Cervenka 07/13/2023, 11:26 PM    [facility]     after 2 AM please page floor coverage PA If 7AM-7PM, please contact the day team taking care of the patient using [email or web address]

## 2023-07-13 NOTE — ED Triage Notes (Signed)
 Pt bib ems from [facility] c/o N/V and sudden onset left flank pain. Hx kidney stones on left side.   Pt currently being treated for UTI and steroid injection in left hip yesterday.   Hx DM2   RA 97% RR 22 HR 70 BP 142/100  20g Left AC  4mg  Zofran 200 mg Fentanyl   Baseline Aox4 and wheelchair with assist

## 2023-07-13 NOTE — ED Provider Notes (Signed)
 [place] EMERGENCY DEPARTMENT AT [facility] Provider Note   CSN: [id number] Arrival date & time: 07/13/23  1232     History Chief Complaint  Patient presents with   Flank Pain   Nausea   Emesis    HPI [name] is a 78 y.o. male presenting for chief complaint of left-sided flank pain.  He states he was diagnosed with a UTI his PCP yesterday started Rocephin IM injection. However symptoms progressively got worse overnight.  Today he is having nausea vomiting severe left flank pain and overall deterioration. He denies any objective fevers. Had severe pain this morning treated with fentanyl in the ambulance with improvement directedly. CT feels very dehydrated and overall weak. History of nephrolithiasis no history of infections that he can recall.. Otherwise diffuse medical history including hypertension, hyperlipidemia, prior sepsis, CAD.  Patient's recorded medical, surgical, social, medication list and allergies were reviewed in the Snapshot window as part of the initial history.   Review of Systems   Review of Systems  Constitutional:  Negative for chills and fever.  HENT:  Negative for ear pain and sore throat.   Eyes:  Negative for pain and visual disturbance.  Respiratory:  Negative for cough and shortness of breath.   Cardiovascular:  Negative for chest pain and palpitations.  Gastrointestinal:  Positive for abdominal pain, nausea and vomiting.  Genitourinary:  Positive for flank pain. Negative for dysuria and hematuria.  Musculoskeletal:  Negative for arthralgias and back pain.  Skin:  Negative for color change and rash.  Neurological:  Negative for seizures and syncope.  All other systems reviewed and are negative.   Physical Exam Updated Vital Signs BP 132/73 (BP Location: Right Arm)   Pulse 64   Temp 98.8 F (37.1 C) (Oral)   Resp 20   Ht 5\' 9"  (1.753 m)   Wt 114.1 kg   SpO2 93%   BMI 37.15 kg/m  Physical Exam Vitals and nursing note  reviewed.  Constitutional:      General: He is not in acute distress.    Appearance: He is well-developed.  HENT:     Head: Normocephalic and atraumatic.  Eyes:     Conjunctiva/sclera: Conjunctivae normal.  Cardiovascular:     Rate and Rhythm: Normal rate and regular rhythm.     Heart sounds: No murmur heard. Pulmonary:     Effort: Pulmonary effort is normal. No respiratory distress.     Breath sounds: Normal breath sounds.  Abdominal:     Palpations: Abdomen is soft.     Tenderness: There is abdominal tenderness. There is no guarding.  Musculoskeletal:        General: No swelling.     Cervical back: Neck supple.  Skin:    General: Skin is warm and dry.     Capillary Refill: Capillary refill takes less than 2 seconds.  Neurological:     Mental Status: He is alert.  Psychiatric:        Mood and Affect: Mood normal.      ED Course/ Medical Decision Making/ A&P    Procedures .Critical Care  Performed by: Glyn Ade, MD Authorized by: Glyn Ade, MD   Critical care provider statement:    Critical care time (minutes):  87   Critical care was necessary to treat or prevent imminent or life-threatening deterioration of the following conditions:  Sepsis and metabolic crisis   Critical care was time spent personally by me on the following activities:  Development of  treatment plan with patient or surrogate, discussions with consultants, evaluation of patient's response to treatment, examination of patient, ordering and review of laboratory studies, ordering and review of radiographic studies, ordering and performing treatments and interventions, pulse oximetry, re-evaluation of patient's condition and review of old charts    Medications Ordered in ED Medications  HYDROmorphone (DILAUDID) injection 0.5 mg ( Intravenous MAR Unhold 07/13/23 2022)  sodium chloride 0.9 % with cefTRIAXone (ROCEPHIN) ADS Med (has no administration in time range)  amLODipine (NORVASC)  tablet 5 mg (5 mg Oral Given 07/13/23 2221)  atorvastatin (LIPITOR) tablet 80 mg (80 mg Oral Given 07/13/23 2221)  carvedilol (COREG) tablet 3.125 mg (has no administration in time range)  levothyroxine (SYNTHROID) tablet 50 mcg (has no administration in time range)  melatonin tablet 3 mg (3 mg Oral Given 07/13/23 2221)  0.9 %  sodium chloride infusion ( Intravenous New Bag/Given 07/13/23 2027)  acetaminophen (TYLENOL) tablet 650 mg (650 mg Oral Given 07/13/23 2225)    Or  acetaminophen (TYLENOL) suppository 650 mg ( Rectal See Alternative 07/13/23 2225)  HYDROcodone-acetaminophen (NORCO/VICODIN) 5-325 MG per tablet 1-2 tablet (has no administration in time range)  cefTRIAXone (ROCEPHIN) 1 g in sodium chloride 0.9 % 100 mL IVPB (has no administration in time range)  ketorolac (TORADOL) 30 MG/ML injection 30 mg (30 mg Intravenous Given 07/13/23 1330)  ondansetron (ZOFRAN) injection 4 mg (4 mg Intravenous Given 07/13/23 1330)  iohexol (OMNIPAQUE) 350 MG/ML injection 75 mL (75 mLs Intravenous Contrast Given 07/13/23 1542)  oxyCODONE (Oxy IR/ROXICODONE) immediate release tablet 5 mg (5 mg Oral Given 07/13/23 1607)  lactated ringers bolus 1,000 mL (1,000 mLs Intravenous New Bag/Given 07/13/23 1610)  cefTRIAXone (ROCEPHIN) 1 g in sodium chloride 0.9 % 100 mL IVPB ( Intravenous MAR Unhold 07/13/23 2022)  chlorhexidine (PERIDEX) 0.12 % solution 15 mL (15 mLs Mouth/Throat Given 07/13/23 1740)    Or  Oral care mouth rinse ( Mouth Rinse See Alternative 07/13/23 1740)  insulin aspart (novoLOG) injection 8 Units (8 Units Subcutaneous Given 07/13/23 1924)   Medical Decision Making:   [name] is a 78 y.o. male who presented to the ED today with abdominal pain, detailed above.   Complete initial physical exam performed, notably the patient  was HDS in NAD.     Reviewed and confirmed nursing documentation for past medical history, family history, social history.    Initial Assessment:   With the patient's  presentation of abdominal pain, most likely diagnosis is nonspecific etiology. Other diagnoses were considered including (but not limited to) gastroenteritis, colitis, small bowel obstruction, appendicitis, cholecystitis, pancreatitis, nephrolithiasis, UTI, pyleonephritis. These are considered less likely due to history of present illness and physical exam findings.   This is most consistent with an acute life/limb threatening illness complicated by underlying chronic conditions.   Initial Plan:  CBC/CMP to evaluate for underlying infectious/metabolic etiology for patient's abdominal pain  Lipase to evaluate for pancreatitis  EKG to evaluate for cardiac source of pain  CTAB/Pelvis with contrast to evaluate for structural/surgical etiology of patients' severe abdominal pain.  Urinalysis and repeat physical assessment to evaluate for UTI/Pyelonpehritis  Empiric management of symptoms with escalating pain control and antiemetics as needed.   Initial Study Results:   Laboratory  Leukocytosis, urine   EKG EKG was reviewed independently. Rate, rhythm, axis, intervals all examined and without medically relevant abnormality. ST segments without concerns for elevations.    Radiology All images reviewed independently. Agree with radiology report at this time.  DG C-Arm 1-60 Min Result Date: 07/13/2023 CLINICAL DATA:  Cystoscopy, bilateral ureteral stent placement EXAM: DG C-ARM 1-60 MIN COMPARISON:  07/13/2023 FINDINGS: A single fluoroscopic image was obtained during the performance of procedure and is submitted for interpretation only. The image demonstrates cannulation and opacification of the right renal collecting system and proximal right ureter. The obstructing calculus seen on the previous CT is not well visualized. Please refer to operative report. Fluoroscopy time: 55.4 seconds, 30.82 mGy IMPRESSION: 1. Intraoperative evaluation as above. Electronically Signed   By: Sharlet Salina M.D.   On:  07/13/2023 19:24   CT ABDOMEN PELVIS W CONTRAST Result Date: 07/13/2023 CLINICAL DATA:  Left flank and lower abdominal pain, nausea and vomiting. EXAM: CT ABDOMEN AND PELVIS WITH CONTRAST TECHNIQUE: Multidetector CT imaging of the abdomen and pelvis was performed using the standard protocol following bolus administration of intravenous contrast. RADIATION DOSE REDUCTION: This exam was performed according to the departmental dose-optimization program which includes automated exposure control, adjustment of the mA and/or kV according to patient size and/or use of iterative reconstruction technique. CONTRAST:  75mL OMNIPAQUE IOHEXOL 350 MG/ML SOLN COMPARISON:  08/01/2022 FINDINGS: Lower chest: Atheromatous calcifications, including the coronary arteries and aorta. Normal-sized heart. Clear lung bases. Hepatobiliary: No focal liver abnormality is seen. Status post cholecystectomy. No biliary dilatation. Pancreas: Unremarkable. No pancreatic ductal dilatation or surrounding inflammatory changes. Spleen: Normal in size without focal abnormality. Adrenals/Urinary Tract: Normal-appearing adrenal glands. 10 mm right UPJ calculus minimal hydronephrosis. 7 mm right proximal ureteral calculus with associated mild-to-moderate left hydronephrosis. No other urinary tract calculi. Minimal diffuse bladder wall thickening with minimal progression. Stomach/Bowel: Sigmoid and descending colon diverticulosis without evidence of diverticulitis. Unremarkable stomach, small bowel and appendix. Vascular/Lymphatic: Atheromatous arterial calcifications without aneurysm. No enlarged lymph nodes. Reproductive: Moderately enlarged prostate gland. Associated mild protrusion of the median lobe into the base of the urinary bladder. Other: Small left inguinal hernia containing fat. Musculoskeletal: Moderate to marked lumbar and lower thoracic spine degenerative changes. Interbody and pedicle screw and rod fixation at the L4 through S1 levels.  Normal alignment. IMPRESSION: 1. 7 mm left proximal ureteral calculus with associated mild-to-moderate left hydronephrosis. 2. 10 mm right UPJ calculus with minimal hydronephrosis. 3. Minimal diffuse bladder wall thickening with minimal progression. This is likely due to chronic bladder outlet obstruction by the patient's enlarged prostate gland. 4. Moderately enlarged prostate gland. 5.  Calcific coronary artery and aortic atherosclerosis. 6. Sigmoid and descending colon diverticulosis. 7. Small left inguinal hernia containing fat. Aortic Atherosclerosis (ICD10-I70.0). Electronically Signed   By: Beckie Salts M.D.   On: 07/13/2023 16:29     Consults: Case discussed with Urology.   Final Reassessment and Plan:   Patient taken emergently to operative suite for stenting. Discussed with medicine for admission postoperatively.        Clinical Impression:  1. Lower urinary tract infectious disease      To OR/IR/Endo...   Final Clinical Impression(s) / ED Diagnoses Final diagnoses:  Lower urinary tract infectious disease    Rx / DC Orders ED Discharge Orders     None         Glyn Ade, MD 07/13/23 2308

## 2023-07-13 NOTE — Assessment & Plan Note (Signed)
 Obtain EKG for baseline. Patient patient with history of A-fib versus PAT. Not on anticoagulation. Continue Coreg at home dose

## 2023-07-13 NOTE — Assessment & Plan Note (Signed)
 Chronic continue supportive management

## 2023-07-13 NOTE — ED Provider Triage Note (Signed)
 Emergency Medicine Provider Triage Evaluation Note  [name] , a 78 y.o. male  was evaluated in triage.  Pt complains of L flank/hip pain. Had steroid shot to L hip yesterday. Hx Dm, kidney stones and UTI?? On ABX unsure what?  Review of Systems  Positive: L sided pain, N/V Negative: SHOB, fever, chills, CP  Physical Exam  BP (!) 182/77   Pulse 69   Temp 97.8 F (36.6 C)   Resp 18   Ht 5\' 9"  (1.753 m)   Wt 112 kg   SpO2 93%   BMI 36.48 kg/m  Gen:   Awake, no distress, uncomfortable appearing Resp:  Normal effort  MSK:   Moves extremities without difficulty  Other:    Medical Decision Making  Medically screening exam initiated at 12:46 PM.  Appropriate orders placed.  [name] was informed that the remainder of the evaluation will be completed by another provider, this initial triage assessment does not replace that evaluation, and the importance of remaining in the ED until their evaluation is complete.  Labs and imaging ordered   [name] 07/13/23 1249

## 2023-07-13 NOTE — Assessment & Plan Note (Signed)
-   treat with Rocephin         await results of urine culture and adjust antibiotic coverage as needed  

## 2023-07-13 NOTE — Anesthesia Preprocedure Evaluation (Addendum)
 Anesthesia Evaluation  Patient identified by MRN, date of birth, ID band Patient awake    Reviewed: Allergy & Precautions, NPO status , Patient's Chart, lab work & pertinent test results  Airway Mallampati: II  TM Distance: >3 FB Neck ROM: Full    Dental  (+) Edentulous Upper, Edentulous Lower   Pulmonary former smoker   Pulmonary exam normal        Cardiovascular hypertension, Pt. on medications and Pt. on home beta blockers + angina  + CAD and + Cardiac Stents (x 1)  Normal cardiovascular exam  ECHO:  1. Left ventricular ejection fraction, by estimation, is 60 to 65%. The  left ventricle has normal function. The left ventricle has no regional  wall motion abnormalities. Left ventricular diastolic parameters are  consistent with Grade II diastolic  dysfunction (pseudonormalization).   2. Right ventricular systolic function is normal. The right ventricular  size is normal. There is normal pulmonary artery systolic pressure.   3. The mitral valve is normal in structure. No evidence of mitral valve  regurgitation. No evidence of mitral stenosis.   4. The aortic valve is normal in structure. Aortic valve regurgitation is  not visualized. No aortic stenosis is present.   5. The inferior vena cava is normal in size with greater than 50%  respiratory variability, suggesting right atrial pressure of 3 mmHg.     Neuro/Psych  Headaches PSYCHIATRIC DISORDERS Anxiety Depression     Neuromuscular disease    GI/Hepatic negative GI ROS, Neg liver ROS,,,  Endo/Other  diabetes, Insulin Dependent, Oral Hypoglycemic AgentsHypothyroidism    Renal/GU Renal disease     Musculoskeletal  (+) Arthritis ,    Abdominal  (+) + obese  Peds  Hematology  (+) Blood dyscrasia (Plavix)   Anesthesia Other Findings left flank pain  Reproductive/Obstetrics                             Anesthesia Physical Anesthesia  Plan  ASA: 4  Anesthesia Plan: General   Post-op Pain Management:    Induction: Intravenous  PONV Risk Score and Plan: 2 and Ondansetron, Dexamethasone and Treatment may vary due to age or medical condition  Airway Management Planned: Oral ETT  Additional Equipment:   Intra-op Plan:   Post-operative Plan: Extubation in OR  Informed Consent: I have reviewed the patients History and Physical, chart, labs and discussed the procedure including the risks, benefits and alternatives for the proposed anesthesia with the patient or authorized representative who has indicated his/her understanding and acceptance.     Dental advisory given  Plan Discussed with: CRNA  Anesthesia Plan Comments:         Anesthesia Quick Evaluation

## 2023-07-13 NOTE — Anesthesia Postprocedure Evaluation (Signed)
 Anesthesia Post Note  Patient: [name]  Procedure(s) Performed: CYSTOSCOPY, BILATERAL URETERAL STENT PLACEMENT, BILATERAL RETROGRADE PYELOGRAM (Bilateral)     Patient location during evaluation: PACU Anesthesia Type: General Level of consciousness: awake and alert Pain management: pain level controlled Vital Signs Assessment: post-procedure vital signs reviewed and stable Respiratory status: spontaneous breathing, nonlabored ventilation and respiratory function stable Cardiovascular status: blood pressure returned to baseline and stable Postop Assessment: no apparent nausea or vomiting Anesthetic complications: no  No notable events documented.  Last Vitals:  Vitals:   07/13/23 1945 07/13/23 2000  BP: (!) 159/77 (!) 167/84  Pulse: 71 65  Resp: 15 20  Temp:  36.6 C  SpO2: 93% 94%    Last Pain:  Vitals:   07/13/23 2000  TempSrc:   PainSc: 0-No pain                 Stanislaw Acton,W. EDMOND

## 2023-07-13 NOTE — Consult Note (Signed)
 Urology Consult    Reason for consult: bilateral ureteral stones  History of Present Illness: [name] is a 78 y.o. M who presented to the ED earlier today c/o left flank pain. CT scan obtained and shows bilateral obstructing stones.   CT scan reviewed - shows 1.5 cm stone at the right UPJ, 1.2 cm stone in the left proximal ureter; hydro bilaterally, L>R.   Patient endorses marked left flank pain. Afebrile in ED, Cr stable. 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) 03/14/2013   Atypical chest pain 12/28/2013   CAD in native artery 03/14/2013   Cervical disc disorder at C5-C6 level with radiculopathy 06/09/2022   Cervical radiculopathy at C5 06/10/2022   Chest pain 07/30/2011   1999 Cath - <50% stenosis 2008 NUC - low risk    Chronic back pain    herniated disc   Coronary artery disease    takes Plavix and ASA daily   Depression    takes Prozac daily   Diabetes mellitus type 2, controlled, with complications (HCC) 07/30/2011   Displacement of lumbar intervertebral disc without myelopathy 05/30/2014   Diverticulitis    hx of   Dizziness    occasionally and states Dr.Smith is aware   DJD (degenerative joint disease)    Essential hypertension, benign 07/30/2011   Fatigue 09/06/2019   GERD (gastroesophageal reflux disease)    takes an OTC antacid   Gout    yrs ago and doesn't take any meds   Hard of hearing    wears hearing aids   Headache(784.0)    occasionally   History of colon polyps    benign   History of fusion of cervical spine 09/04/2020   Last Assessment & Plan:   Formatting of this note might be different from the original.  Review of recent cervical spine CT on care everywhere notes ACDF from C5-7 without hardware complication.  At this time he is doing well without any symptomatic issues.   History  of kidney stones    HNP (herniated nucleus pulposus), lumbar 06/14/2014   Hyperlipidemia    takes Atorvastatin daily   Hypertension    takes Amlodipine,Losartan,and Coreg daily   Hypothyroidism    takes Synthroid daily   IBS (irritable bowel syndrome)    Lumbar post-laminectomy syndrome 09/04/2020   Last Assessment & Plan:   Formatting of this note might be different from the original.  78 year old male with chronic low back pain history of multiple lumbar spine surgeries, he has had a multilevel decompression and is fused from L4 to the sacrum.  His lumbar MRI completed in 2017 shows improvement compared with prior status post left L3-4 diskectomy in March of 2016 there is mild residual mass   Nontraumatic complete tear of right rotator cuff 06/09/2022   Obesity, unspecified 07/30/2011   Other spondylosis, lumbar region 02/12/2021   Pain medication agreement 09/04/2020   Last Assessment & Plan:   Formatting of this note might be different from the original.  Agreement up-to-date.  Patient and I have discussed the hazardous effects of continued opiate pain medication usage. Risks and benefits of above medications including but not limited to possibility of respiratory depression, sedation, and even death were discussed with the patient who expressed an understandin   PAT (paroxysmal atrial tachycardia) (HCC) 10/12/2020  Preoperative cardiovascular examination 09/06/2019   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   Right sided weakness 06/04/2022   Shortness of breath dyspnea    with exertion   Swelling of lower limb 06/20/2014   Tuberculosis    Patient denies Tb   Type II diabetes mellitus (HCC)    takes Metformin daily   Unstable angina (HCC) 12/27/2013   Weakness    numbness and tingling in left leg    Past Surgical History:  Procedure Laterality Date   ANTERIOR CERVICAL DECOMP/DISCECTOMY FUSION  04/2007   C4-5; C6-7   BACK SURGERY     "3 times; last time was  screws in lower back 10/2008"   CHOLECYSTECTOMY  1970's   COLONOSCOPY     CORONARY ANGIOPLASTY  1999/2013   1 stent   CYSTOSCOPY     LEFT HEART CATH AND CORONARY ANGIOGRAPHY N/A 09/30/2019   Procedure: LEFT HEART CATH AND CORONARY ANGIOGRAPHY;  Surgeon: Corky Crafts, MD;  Location: [facility];  Service: Cardiovascular;  Laterality: N/A;   LEFT HEART CATHETERIZATION WITH CORONARY ANGIOGRAM N/A 08/21/2011   Procedure: LEFT HEART CATHETERIZATION WITH CORONARY ANGIOGRAM;  Surgeon: Lesleigh Noe, MD;  Location: [facility] CATH LAB;  Service: Cardiovascular;  Laterality: N/A;   LITHOTRIPSY     LUMBAR LAMINECTOMY/DECOMPRESSION MICRODISCECTOMY Left 06/14/2014   Procedure: LEFT LUMBAR THREE-FOUR LANINOTOMY AND MICRODISKECTOMY.;  Surgeon: Hewitt Shorts, MD;  Location: [facility] NEURO ORS;  Service: Neurosurgery;  Laterality: Left;  left   PCI  5/09/2-13   LAD   RADIOLOGY WITH ANESTHESIA N/A 11/13/2015   Procedure: MRI OF LUMBAR SPINE W/WO CONTRAST    (RADIOLOGY WITH ANESTHESIA);  Surgeon: Medication Radiologist, MD;  Location: [facility] OR;  Service: Radiology;  Laterality: N/A;   RADIOLOGY WITH ANESTHESIA N/A 06/05/2022   Procedure: MRI WITH ANESTHESIA;  Surgeon: Radiologist, Medication, MD;  Location: [facility] OR;  Service: Radiology;  Laterality: N/A;   RADIOLOGY WITH ANESTHESIA N/A 06/08/2022   Procedure: MRI WITH ANESTHESIA;  Surgeon: Radiologist, Medication, MD;  Location: [facility] OR;  Service: Radiology;  Laterality: N/A;   SHOULDER ARTHROSCOPY W/ ROTATOR CUFF REPAIR  ~ 2011   left    Current Hospital Medications:  Home Meds:  No current facility-administered medications on file prior to encounter.   Current Outpatient Medications on File Prior to Encounter  Medication Sig Dispense Refill   acetaminophen (TYLENOL) 500 MG tablet Take 1,000 mg by mouth every 6 (six) hours as needed for mild pain.      amLODipine (NORVASC) 5 MG tablet Take 1 tablet (5 mg total) by mouth daily.     aspirin 81 MG tablet Take  81 mg by mouth daily.     atorvastatin (LIPITOR) 80 MG tablet Take 80 mg by mouth daily.     carvedilol (COREG) 3.125 MG tablet Take 1 tablet (3.125 mg total) by mouth 2 (two) times daily with a meal. 60 tablet 0   clopidogrel (PLAVIX) 75 MG tablet Take 75 mg by mouth daily with breakfast.     diclofenac Sodium (VOLTAREN) 1 % GEL Apply 4 g topically 4 (four) times daily.     docusate sodium (COLACE) 100 MG capsule Take 1 capsule (100 mg total) by mouth 2 (two) times daily as needed for mild constipation. 10 capsule 0   insulin aspart (NOVOLOG) 100 UNIT/ML injection Inject 0-9 Units into the skin every 4 (four) hours. 10 mL 11   levothyroxine (SYNTHROID, LEVOTHROID) 50 MCG tablet Take 50 mcg by mouth daily  before breakfast.     lidocaine (LIDODERM) 5 % Place 1 patch onto the skin daily. Remove & Discard patch within 12 hours or as directed by MD 30 patch 0   melatonin 3 MG TABS tablet Take 1 tablet (3 mg total) by mouth at bedtime.  0   metFORMIN (GLUCOPHAGE-XR) 500 MG 24 hr tablet Take 1 tablet (500 mg total) by mouth 2 (two) times daily.     methocarbamol (ROBAXIN) 750 MG tablet Take 1 tablet (750 mg total) by mouth every 8 (eight) hours as needed for muscle spasms.     nitroGLYCERIN (NITROSTAT) 0.4 MG SL tablet Place 0.4 mg under the tongue every 5 (five) minutes as needed for chest pain.     polyethylene glycol powder (GLYCOLAX/MIRALAX) 17 GM/SCOOP powder Take 17 g by mouth in the morning.     sertraline (ZOLOFT) 100 MG tablet Take 1 tablet (100 mg total) by mouth at bedtime.       Scheduled Meds: Continuous Infusions:  cefTRIAXone (ROCEPHIN)  IV     PRN Meds:.HYDROmorphone (DILAUDID) injection  Allergies:  Allergies  Allergen Reactions   Misc. Sulfonamide Containing Compounds Rash   Sulfa Antibiotics Rash    Family History  Problem Relation Age of Onset   Cancer Father        colon   Arthritis Mother    Healthy Sister    Heart attack Brother    Heart disease Brother     Hypertension Brother    Healthy Sister     Social History:  reports that he has quit smoking. His smokeless tobacco use includes chew. He reports that he does not drink alcohol and does not use drugs.  ROS: A complete review of systems was performed.  All systems are negative except for pertinent findings as noted.  Physical Exam:  Vital signs in last 24 hours: Temp:  [97.8 F (36.6 C)-98.7 F (37.1 C)] 98.7 F (37.1 C) (03/31 1611) Pulse Rate:  [67-69] 67 (03/31 1611) Resp:  [18-21] 21 (03/31 1611) BP: (161-182)/(77-82) 161/82 (03/31 1611) SpO2:  [93 %-97 %] 97 % (03/31 1611) Weight:  [191 kg] 112 kg (03/31 1241) Constitutional:  Alert and oriented, No acute distress Cardiovascular: Regular rate and rhythm Respiratory: Normal respiratory effort, Lungs clear bilaterally GI: Abdomen is soft, nontender, nondistended, no abdominal masses Neurologic: Grossly intact, no focal deficits Psychiatric: Normal mood and affect  Laboratory Data:  Recent Labs    07/13/23 1319  WBC 13.8*  HGB 14.5  HCT 42.7  PLT 243    Recent Labs    07/13/23 1319  NA 134*  K 4.7  CL 100  GLUCOSE 320*  BUN 14  CALCIUM 9.4  CREATININE 0.89     Results for orders placed or performed during the hospital encounter of 07/13/23 (from the past 24 hours)  CBG monitoring, ED     Status: Abnormal   Collection Time: 07/13/23  1:18 PM  Result Value Ref Range   Glucose-Capillary 298 (H) 70 - 99 mg/dL   Comment 1 Notify RN    Comment 2 Document in Chart   Comprehensive metabolic panel     Status: Abnormal   Collection Time: 07/13/23  1:19 PM  Result Value Ref Range   Sodium 134 (L) 135 - 145 mmol/L   Potassium 4.7 3.5 - 5.1 mmol/L   Chloride 100 98 - 111 mmol/L   CO2 21 (L) 22 - 32 mmol/L   Glucose, Bld 320 (H) 70 - 99 mg/dL  BUN 14 8 - 23 mg/dL   Creatinine, Ser 1.61 0.61 - 1.24 mg/dL   Calcium 9.4 8.9 - 09.6 mg/dL   Total Protein 7.1 6.5 - 8.1 g/dL   Albumin 4.0 3.5 - 5.0 g/dL   AST 24 15  - 41 U/L   ALT 27 0 - 44 U/L   Alkaline Phosphatase 54 38 - 126 U/L   Total Bilirubin 0.7 0.0 - 1.2 mg/dL   GFR, Estimated >04 >54 mL/min   Anion gap 13 5 - 15  CBC with Differential     Status: Abnormal   Collection Time: 07/13/23  1:19 PM  Result Value Ref Range   WBC 13.8 (H) 4.0 - 10.5 K/uL   RBC 4.70 4.22 - 5.81 MIL/uL   Hemoglobin 14.5 13.0 - 17.0 g/dL   HCT 09.8 11.9 - 14.7 %   MCV 90.9 80.0 - 100.0 fL   MCH 30.9 26.0 - 34.0 pg   MCHC 34.0 30.0 - 36.0 g/dL   RDW 82.9 56.2 - 13.0 %   Platelets 243 150 - 400 K/uL   nRBC 0.0 0.0 - 0.2 %   Neutrophils Relative % 80 %   Neutro Abs 11.1 (H) 1.7 - 7.7 K/uL   Lymphocytes Relative 13 %   Lymphs Abs 1.8 0.7 - 4.0 K/uL   Monocytes Relative 5 %   Monocytes Absolute 0.7 0.1 - 1.0 K/uL   Eosinophils Relative 1 %   Eosinophils Absolute 0.1 0.0 - 0.5 K/uL   Basophils Relative 0 %   Basophils Absolute 0.1 0.0 - 0.1 K/uL   Immature Granulocytes 1 %   Abs Immature Granulocytes 0.12 (H) 0.00 - 0.07 K/uL  Urinalysis, w/ Reflex to Culture (Infection Suspected) -Urine, Clean Catch     Status: Abnormal   Collection Time: 07/13/23  1:19 PM  Result Value Ref Range   Specimen Source URINE, CLEAN CATCH    Color, Urine YELLOW YELLOW   APPearance HAZY (A) CLEAR   Specific Gravity, Urine 1.017 1.005 - 1.030   pH 5.0 5.0 - 8.0   Glucose, UA >=500 (A) NEGATIVE mg/dL   Hgb urine dipstick SMALL (A) NEGATIVE   Bilirubin Urine NEGATIVE NEGATIVE   Ketones, ur NEGATIVE NEGATIVE mg/dL   Protein, ur 30 (A) NEGATIVE mg/dL   Nitrite NEGATIVE NEGATIVE   Leukocytes,Ua LARGE (A) NEGATIVE   RBC / HPF 6-10 0 - 5 RBC/hpf   WBC, UA >50 0 - 5 WBC/hpf   Bacteria, UA RARE (A) NONE SEEN   Squamous Epithelial / HPF 0-5 0 - 5 /HPF   WBC Clumps PRESENT    Mucus PRESENT    No results found for this or any previous visit (from the past 240 hours).  Renal Function: Recent Labs    07/13/23 1319  CREATININE 0.89   Estimated Creatinine Clearance: 88.5 mL/min  (by C-G formula based on SCr of 0.89 mg/dL).  Radiologic Imaging: CT ABDOMEN PELVIS W CONTRAST Result Date: 07/13/2023 CLINICAL DATA:  Left flank and lower abdominal pain, nausea and vomiting. EXAM: CT ABDOMEN AND PELVIS WITH CONTRAST TECHNIQUE: Multidetector CT imaging of the abdomen and pelvis was performed using the standard protocol following bolus administration of intravenous contrast. RADIATION DOSE REDUCTION: This exam was performed according to the departmental dose-optimization program which includes automated exposure control, adjustment of the mA and/or kV according to patient size and/or use of iterative reconstruction technique. CONTRAST:  75mL OMNIPAQUE IOHEXOL 350 MG/ML SOLN COMPARISON:  08/01/2022 FINDINGS: Lower chest: Atheromatous calcifications, including the  coronary arteries and aorta. Normal-sized heart. Clear lung bases. Hepatobiliary: No focal liver abnormality is seen. Status post cholecystectomy. No biliary dilatation. Pancreas: Unremarkable. No pancreatic ductal dilatation or surrounding inflammatory changes. Spleen: Normal in size without focal abnormality. Adrenals/Urinary Tract: Normal-appearing adrenal glands. 10 mm right UPJ calculus minimal hydronephrosis. 7 mm right proximal ureteral calculus with associated mild-to-moderate left hydronephrosis. No other urinary tract calculi. Minimal diffuse bladder wall thickening with minimal progression. Stomach/Bowel: Sigmoid and descending colon diverticulosis without evidence of diverticulitis. Unremarkable stomach, small bowel and appendix. Vascular/Lymphatic: Atheromatous arterial calcifications without aneurysm. No enlarged lymph nodes. Reproductive: Moderately enlarged prostate gland. Associated mild protrusion of the median lobe into the base of the urinary bladder. Other: Small left inguinal hernia containing fat. Musculoskeletal: Moderate to marked lumbar and lower thoracic spine degenerative changes. Interbody and pedicle screw  and rod fixation at the L4 through S1 levels. Normal alignment. IMPRESSION: 1. 7 mm left proximal ureteral calculus with associated mild-to-moderate left hydronephrosis. 2. 10 mm right UPJ calculus with minimal hydronephrosis. 3. Minimal diffuse bladder wall thickening with minimal progression. This is likely due to chronic bladder outlet obstruction by the patient's enlarged prostate gland. 4. Moderately enlarged prostate gland. 5.  Calcific coronary artery and aortic atherosclerosis. 6. Sigmoid and descending colon diverticulosis. 7. Small left inguinal hernia containing fat. Aortic Atherosclerosis (ICD10-I70.0). Electronically Signed   By: Beckie Salts M.D.   On: 07/13/2023 16:29    I independently reviewed the above imaging studies.  Impression/Recommendation 78 yo M with bilateral obstructing stones --to OR urgently for bilateral ureteral stent placement. Procedure and risks reviewed. Discussed risk of failure and need for IR neph tube placement --will arrange for ureteroscopy as outpatient - likely staged procedure given size of stones   Irine Seal MD 07/13/2023, 5:03 PM  [facility]  Pager: [phone]   CC:

## 2023-07-13 NOTE — Subjective & Objective (Addendum)
 Patient came in from [place] complaint nausea vomiting and abdominal pain severe.  Recently was diagnosed with UTI Also had steroid injections in the left hip. Started to have fevers and severe flank pain presented to emergency department found to have bilateral ureteral stones with obstruction and was emergently taken to the OR  CT scan showed 1.5 cm stones in the right UPJ and 1.2 cm stone in the left proximal ureter bilateral hydro left worse than right Left flank pain worse than right Evidence of UTI

## 2023-07-13 NOTE — Assessment & Plan Note (Signed)
 Resume Norvasc 5 mg a day and resume Coreg 3.125 mg p.o. twice a day

## 2023-07-13 NOTE — Assessment & Plan Note (Signed)
 Appreciate urology consult patient was taken emergently to the OR with bilateral stents placed.  Continue Rocephin defer to urology regarding follow-up and management

## 2023-07-13 NOTE — Assessment & Plan Note (Addendum)
 Hold aspirin and Plavix until safe to resume by urology.  Resume Coreg 3.125 mg p.o. twice daily Resume Lipitor 80 mg daily

## 2023-07-13 NOTE — Assessment & Plan Note (Signed)
Hold p.o. medications order sliding scale 

## 2023-07-13 NOTE — Op Note (Signed)
 Operative Note  Preoperative diagnosis:  1.  Bilateral ureteral stones 2. Bilateral hydronephrosis  Postoperative diagnosis: 1.  same  Procedure(s): 1.  Cystoscopy 2. Bilateral retrograde pyelogram with interpretation 3. Bilateral ureteral stent placement (6x24 on the right, 6x26 on the left) 4. Fluoroscopy <1 hour with intraoperative interpretation  Surgeon: Irine Seal, MD  Assistants:  None  Anesthesia:  General  Complications:  None  EBL:  minimal  Specimens: 1. none  Drains/Catheters: 1.  Bilateral ureteral stents  Intraoperative findings:   Cystoscopy demonstrated no suspicious lesions, masses, stones or other pathology. Bilobar enlargement of prostate R retrograde showed hydronephrosis and a filling defect at the UPJ corresponding with the stone. L retrograde showed hydronephrosis tracking down to the stone. There was a filling defect corresponding with the location of the stone Successful bilateral ureteral stent placement with curl in the renal pelvis and bladder respectively.  Indication:  [name] is a 78 y.o. male with bilateral obstructing stones. After reviewing the management options for treatment, he elected to proceed with the above surgical procedure(s). We have discussed the potential benefits and risks of the procedure, side effects of the proposed treatment, the likelihood of the patient achieving the goals of the procedure, and any potential problems that might occur during the procedure or recuperation. Informed consent has been obtained.  Description of procedure: The patient was taken to the operating room and general anesthesia was induced.  The patient was placed in the dorsal lithotomy position, prepped and draped in the usual sterile fashion, and preoperative antibiotics were administered. A preoperative time-out was performed.   Cystourethroscopy was performed.  The patient's urethra was examined and was normal. There was some bilobar  prostatic hypertrophy. The bladder was then systematically examined in its entirety. There was no evidence for any bladder tumors, stones, or other mucosal pathology.    Attention then turned to the left ureteral orifice. A 0.038 zip wire was passed through the left orifice and over the wire a 5 Fr open ended catheter was inserted and passed up to the level of the renal pelvis. Omnipaque contrast was injected through the ureteral catheter and a retrograde pyelogram was performed with findings as dictated above. The wire was then replaced and the open ended catheter was removed.   A 6Fr x 26cm ureteral stent was advance over the wire. The stent was positioned appropriately under fluoroscopic and cystoscopic guidance.  The wire was then removed with an adequate stent curl noted in the renal pelvis as well as in the bladder.  The procedure was repeated in an identical fashion on the right.    The bladder was then emptied and the procedure ended.  The patient appeared to tolerate the procedure well and without complications.  The patient was able to be awakened and transferred to the recovery unit in satisfactory condition.   Plan:  return to floor; will arrange for staged ureteroscopy as outpatient   Neva Seat MD [facility]  Pager: [phone]

## 2023-07-13 NOTE — Assessment & Plan Note (Signed)
Continue Lipitor 80 mg a day.

## 2023-07-13 NOTE — Anesthesia Procedure Notes (Signed)
 Procedure Name: Intubation Date/Time: 07/13/2023 6:26 PM  Performed by: Darlina Guys, CRNAPre-anesthesia Checklist: Patient identified, Emergency Drugs available, Suction available and Patient being monitored Patient Re-evaluated:Patient Re-evaluated prior to induction Oxygen Delivery Method: Circle System Utilized Preoxygenation: Pre-oxygenation with 100% oxygen Induction Type: IV induction Ventilation: Mask ventilation without difficulty Laryngoscope Size: Mac and 4 Grade View: Grade I Tube type: Oral Tube size: 7.5 mm Number of attempts: 1 Airway Equipment and Method: Stylet and Oral airway Placement Confirmation: ETT inserted through vocal cords under direct vision, positive ETCO2 and breath sounds checked- equal and bilateral Secured at: 23 cm Tube secured with: Tape Dental Injury: Teeth and Oropharynx as per pre-operative assessment  Comments: Atraumatic induction/intubation. Edentulous. Oral mucosa as per preop.

## 2023-07-13 NOTE — Transfer of Care (Signed)
 Immediate Anesthesia Transfer of Care Note  Patient: [name]  Procedure(s) Performed: CYSTOSCOPY, BILATERAL URETERAL STENT PLACEMENT, BILATERAL RETROGRADE PYELOGRAM (Bilateral)  Patient Location: PACU  Anesthesia Type:General  Level of Consciousness: awake  Airway & Oxygen Therapy: Patient Spontanous Breathing and Patient connected to face mask oxygen  Post-op Assessment: Report given to RN and Post -op Vital signs reviewed and stable  Post vital signs: Reviewed and stable  Last Vitals:  Vitals Value Taken Time  BP 127/69 07/13/23 1857  Temp 37 C 07/13/23 1857  Pulse 81 07/13/23 1900  Resp 24 07/13/23 1900  SpO2 99 % 07/13/23 1900  Vitals shown include unfiled device data.  Last Pain:  Vitals:   07/13/23 1728  TempSrc:   PainSc: 10-Worst pain ever         Complications: No notable events documented.

## 2023-07-13 NOTE — ED Notes (Signed)
 Awaiting patient from lobby.

## 2023-07-14 ENCOUNTER — Encounter (HOSPITAL_COMMUNITY): Payer: Self-pay | Admitting: Urology

## 2023-07-14 DIAGNOSIS — E871 Hypo-osmolality and hyponatremia: Secondary | ICD-10-CM | POA: Diagnosis present

## 2023-07-14 DIAGNOSIS — Z794 Long term (current) use of insulin: Secondary | ICD-10-CM | POA: Diagnosis not present

## 2023-07-14 DIAGNOSIS — E66812 Obesity, class 2: Secondary | ICD-10-CM | POA: Diagnosis present

## 2023-07-14 DIAGNOSIS — Z7401 Bed confinement status: Secondary | ICD-10-CM | POA: Diagnosis not present

## 2023-07-14 DIAGNOSIS — N201 Calculus of ureter: Secondary | ICD-10-CM | POA: Diagnosis not present

## 2023-07-14 DIAGNOSIS — E039 Hypothyroidism, unspecified: Secondary | ICD-10-CM | POA: Diagnosis present

## 2023-07-14 DIAGNOSIS — Z79890 Hormone replacement therapy (postmenopausal): Secondary | ICD-10-CM | POA: Diagnosis not present

## 2023-07-14 DIAGNOSIS — N136 Pyonephrosis: Secondary | ICD-10-CM | POA: Diagnosis present

## 2023-07-14 DIAGNOSIS — I110 Hypertensive heart disease with heart failure: Secondary | ICD-10-CM | POA: Diagnosis present

## 2023-07-14 DIAGNOSIS — I480 Paroxysmal atrial fibrillation: Secondary | ICD-10-CM | POA: Diagnosis present

## 2023-07-14 DIAGNOSIS — I5032 Chronic diastolic (congestive) heart failure: Secondary | ICD-10-CM | POA: Diagnosis present

## 2023-07-14 DIAGNOSIS — N400 Enlarged prostate without lower urinary tract symptoms: Secondary | ICD-10-CM | POA: Diagnosis present

## 2023-07-14 DIAGNOSIS — E860 Dehydration: Secondary | ICD-10-CM | POA: Diagnosis present

## 2023-07-14 DIAGNOSIS — E785 Hyperlipidemia, unspecified: Secondary | ICD-10-CM | POA: Diagnosis present

## 2023-07-14 DIAGNOSIS — Z7984 Long term (current) use of oral hypoglycemic drugs: Secondary | ICD-10-CM | POA: Diagnosis not present

## 2023-07-14 DIAGNOSIS — E1165 Type 2 diabetes mellitus with hyperglycemia: Secondary | ICD-10-CM | POA: Diagnosis present

## 2023-07-14 DIAGNOSIS — I4719 Other supraventricular tachycardia: Secondary | ICD-10-CM | POA: Diagnosis present

## 2023-07-14 DIAGNOSIS — D649 Anemia, unspecified: Secondary | ICD-10-CM | POA: Diagnosis present

## 2023-07-14 DIAGNOSIS — I2510 Atherosclerotic heart disease of native coronary artery without angina pectoris: Secondary | ICD-10-CM | POA: Diagnosis present

## 2023-07-14 DIAGNOSIS — Z743 Need for continuous supervision: Secondary | ICD-10-CM | POA: Diagnosis not present

## 2023-07-14 DIAGNOSIS — G8929 Other chronic pain: Secondary | ICD-10-CM | POA: Diagnosis present

## 2023-07-14 DIAGNOSIS — I700 Atherosclerosis of aorta: Secondary | ICD-10-CM | POA: Diagnosis present

## 2023-07-14 DIAGNOSIS — Z79899 Other long term (current) drug therapy: Secondary | ICD-10-CM | POA: Diagnosis not present

## 2023-07-14 DIAGNOSIS — N200 Calculus of kidney: Secondary | ICD-10-CM | POA: Diagnosis not present

## 2023-07-14 DIAGNOSIS — F32A Depression, unspecified: Secondary | ICD-10-CM | POA: Diagnosis present

## 2023-07-14 DIAGNOSIS — N132 Hydronephrosis with renal and ureteral calculous obstruction: Secondary | ICD-10-CM | POA: Diagnosis not present

## 2023-07-14 DIAGNOSIS — N179 Acute kidney failure, unspecified: Secondary | ICD-10-CM | POA: Diagnosis not present

## 2023-07-14 LAB — CBC
HCT: 39.7 % (ref 39.0–52.0)
Hemoglobin: 13.7 g/dL (ref 13.0–17.0)
MCH: 31.2 pg (ref 26.0–34.0)
MCHC: 34.5 g/dL (ref 30.0–36.0)
MCV: 90.4 fL (ref 80.0–100.0)
Platelets: 204 10*3/uL (ref 150–400)
RBC: 4.39 MIL/uL (ref 4.22–5.81)
RDW: 12.7 % (ref 11.5–15.5)
WBC: 9 10*3/uL (ref 4.0–10.5)
nRBC: 0 % (ref 0.0–0.2)

## 2023-07-14 LAB — COMPREHENSIVE METABOLIC PANEL WITH GFR
ALT: 22 U/L (ref 0–44)
AST: 17 U/L (ref 15–41)
Albumin: 3.4 g/dL — ABNORMAL LOW (ref 3.5–5.0)
Alkaline Phosphatase: 46 U/L (ref 38–126)
Anion gap: 11 (ref 5–15)
BUN: 15 mg/dL (ref 8–23)
CO2: 22 mmol/L (ref 22–32)
Calcium: 8.9 mg/dL (ref 8.9–10.3)
Chloride: 103 mmol/L (ref 98–111)
Creatinine, Ser: 0.85 mg/dL (ref 0.61–1.24)
GFR, Estimated: >60 mL/min (ref >60)
Glucose, Bld: 257 mg/dL — ABNORMAL HIGH (ref 70–99)
Potassium: 4.5 mmol/L (ref 3.5–5.1)
Sodium: 136 mmol/L (ref 135–145)
Total Bilirubin: 0.9 mg/dL (ref 0.0–1.2)
Total Protein: 6.3 g/dL — ABNORMAL LOW (ref 6.5–8.1)

## 2023-07-14 LAB — LACTIC ACID, PLASMA: Lactic Acid, Venous: 1 mmol/L (ref 0.5–1.9)

## 2023-07-14 LAB — HEMOGLOBIN A1C
Hgb A1c MFr Bld: 9.5 % — ABNORMAL HIGH (ref 4.8–5.6)
Mean Plasma Glucose: 225.95 mg/dL

## 2023-07-14 LAB — GLUCOSE, CAPILLARY
Glucose-Capillary: 278 mg/dL — ABNORMAL HIGH (ref 70–99)
Glucose-Capillary: 302 mg/dL — ABNORMAL HIGH (ref 70–99)
Glucose-Capillary: 252 mg/dL — ABNORMAL HIGH (ref 70–99)

## 2023-07-14 LAB — MAGNESIUM: Magnesium: 1.6 mg/dL — ABNORMAL LOW (ref 1.7–2.4)

## 2023-07-14 LAB — PHOSPHORUS: Phosphorus: 4.2 mg/dL (ref 2.5–4.6)

## 2023-07-14 MED ORDER — INSULIN ASPART 100 UNIT/ML IJ SOLN
0.0000 [IU] | Freq: Every day | INTRAMUSCULAR | Status: DC
Start: 2023-07-14 — End: 2023-07-17
  Administered 2023-07-14: 3 [IU] via SUBCUTANEOUS
  Administered 2023-07-16: 4 [IU] via SUBCUTANEOUS

## 2023-07-14 MED ORDER — INSULIN ASPART 100 UNIT/ML IJ SOLN
0.0000 [IU] | Freq: Three times a day (TID) | INTRAMUSCULAR | Status: DC
  Administered 2023-07-14: 7 [IU] via SUBCUTANEOUS
  Administered 2023-07-15: 3 [IU] via SUBCUTANEOUS
  Administered 2023-07-15: 5 [IU] via SUBCUTANEOUS
  Administered 2023-07-15 – 2023-07-16 (×2): 3 [IU] via SUBCUTANEOUS
  Administered 2023-07-16: 5 [IU] via SUBCUTANEOUS
  Administered 2023-07-16: 3 [IU] via SUBCUTANEOUS
  Administered 2023-07-17: 7 [IU] via SUBCUTANEOUS

## 2023-07-14 NOTE — Inpatient Diabetes Management (Signed)
 Inpatient Diabetes Program Recommendations  AACE/ADA: New Consensus Statement on Inpatient Glycemic Control (2015)  Target Ranges:  Prepandial:   less than 140 mg/dL      Peak postprandial:   less than 180 mg/dL (1-2 hours)      Critically ill patients:  140 - 180 mg/dL   Lab Results  Component Value Date   GLUCAP 257 (H) 07/13/2023   HGBA1C 8.1 (H) 06/05/2022    Review of Glycemic Control  Latest Reference Range & Units 07/13/23 13:18 07/13/23 17:24 07/13/23 18:58  Glucose-Capillary 70 - 99 mg/dL 161 (H) 096 (H) 045 (H)   Diabetes history: DM 2 Outpatient Diabetes medications:  Metformin 1000 mg bid Humalog 0-9 units tid with meals  Current orders for Inpatient glycemic control:  None  Inpatient Diabetes Program Recommendations:    Note history of DM. Please add Novolog sensitive (0-9 units) q 4 hours while in the hospital.   Thanks,  Lorenza Cambridge, RN, BC-ADM Inpatient Diabetes Coordinator Pager [phone]  (8a-5p)

## 2023-07-14 NOTE — Progress Notes (Signed)
 PROGRESS NOTE  TEJAS SEAWOOD IRC:[id number] DOB: May 29, 1945 DOA: 07/13/2023 PCP: Lonie Peak, PA-C   LOS: 0 days   Brief narrative:  [name] is a 78 y.o. male with past medical history significant of DM2, Spinal stenosis, hypertension, hyperlipidemia, CAD, paroxysmal A.fib, hypothyrodism, hx of septic Shock  due to UTI, kidney stones, chronic back pain, ataxia, GERD, HOH presented to hospital with flank pain, fever and chills.  Patient presented from [facility].  Was recently diagnosed of UTI and had steroid injection in the left hip.  She started having fevers and flank pain and was noted to have bilateral ureteric stones with obstruction as per CT scan with 1.5 cm in the right UPC and 1.2 cm in the left proximal ureter with bilateral hydronephrosis.  Lab was notable for mild hyponatremia with sodium of 134 with mild leukocytosis at 13.8.  Lactate was within normal limits.  Urinalysis showed leukocytes.  Urology was called in and patient was emergently taken to the OR. In the ED, patient received Dilaudid, Ringer lactate, Toradol and oxycodone including Rocephin IV.  Patient was then admitted to the hospital for further evaluation and treatment.   Assessment/Plan: Principal Problem:   Bilateral ureteral calculi Active Problems:   CAD in native artery   Essential hypertension, benign   Diabetes mellitus type 2, controlled, with complications (HCC)   Hyperlipidemia   Chronic back pain   Hypothyroidism   PAT (paroxysmal atrial tachycardia) (HCC)   UTI (urinary tract infection)  Lower urinary tract infection secondary to bilateral ureteric stones.   Seen by urology and underwent cystoscopy, ureteroscopy with status post bilateral ureteric stent placement on 07/13/2023.  Continue IV Rocephin. .  Temperature max of 99 F.  Leukocytosis has trended down.  Urine cultures pending.  Creatinine of 0.8.  Hyperlipidemia continue statins.  Hypertension.  Patient is on  Coreg, Norvasc and losartan.  Blood pressure was elevated on presentation.  Continue on Norvasc and Coreg.  Chronic diastolic heart failure.  Currently compensated.  Review of previous 2D echocardiogram from 12/04/2021 showed LV ejection fraction of 60 to 65% with grade 2 diastolic dysfunction.  History of CAD status post stents in 99 and 2013.  Continue aspirin statin beta-blocker Plavix.  Last cardiac catheter 2021.  Diabetes mellitus type 2 with hyperglycemia.  On oral hypoglycemics at home.  Continue sliding scale insulin while in the hospital.,  Closely monitor blood glucose levels.  Hypothyroidism.  Continue Synthroid.  Class II obesity.  Body mass index is 37.15 kg/m. Would benefit from weight loss as outpatient.  Paroxysmal atrial fibrillation/paroxysmal atrial tachycardia..  [place] vas score of 5.  Currently in normal sinus rhythm.  Chronic anemia.  Continue to monitor CBC.   DVT prophylaxis: SCDs Start: 07/13/23 2024   Disposition: Likely home in 1 to 2 days  Status is: Observation  The patient will require care spanning > 2 midnights and should be moved to inpatient because: Status post ureteric intervention, need for urine culture follow-up, pending cultures, patient from skilled nursing facility.    Code Status:     Code Status: Full Code  Family Communication: Spoke with the patient's sister at bedside  Consultants: Urology  Procedures: None  Anti-infectives:  Rocephin  Anti-infectives (From admission, onward)    Start     Dose/Rate Route Frequency Ordered Stop   07/14/23 1800  cefTRIAXone (ROCEPHIN) 1 g in sodium chloride 0.9 % 100 mL IVPB        1 g 200 mL/hr  over 30 Minutes Intravenous Every 24 hours 07/13/23 1943     07/13/23 1754  sodium chloride 0.9 % with cefTRIAXone (ROCEPHIN) ADS Med       Note to Pharmacy: Adriana Reams: cabinet override      07/13/23 1754 07/14/23 0559   07/13/23 1700  cefTRIAXone (ROCEPHIN) 1 g in sodium chloride 0.9 % 100 mL  IVPB        1 g 200 mL/hr over 30 Minutes Intravenous  Once 07/13/23 1647 07/13/23 1819        Subjective: Today, patient was seen and examined at bedside.  Hard of hearing.  Denies any nausea vomiting fever chills or rigor but bilateral lower abdominal pain especially on coughing.  Has been having some urine but not much.  Denies dysuria or urgency.  Objective: Vitals:   07/14/23 0757 07/14/23 1106  BP: (!) 155/73 (!) 153/74  Pulse: 67 71  Resp: 20 20  Temp: 98.7 F (37.1 C) 98.7 F (37.1 C)  SpO2: 97% 95%    Intake/Output Summary (Last 24 hours) at 07/14/2023 1126 Last data filed at 07/14/2023 0900 Gross per 24 hour  Intake 1266.06 ml  Output 830 ml  Net 436.06 ml   Filed Weights   07/13/23 1241 07/13/23 2024  Weight: 112 kg 114.1 kg   Body mass index is 37.15 kg/m.   Physical Exam:  GENERAL: Patient is alert awake and oriented. Not in obvious distress.  Obese built, chronically ill-appearing, HENT: No scleral pallor or icterus. Pupils equally reactive to light. Oral mucosa is moist NECK: is supple, no gross swelling noted. CHEST: Clear to auscultation. No crackles or wheezes.  Diminished breath sounds bilaterally. CVS: S1 and S2 heard, no murmur. Regular rate and rhythm.  ABDOMEN: Soft, mild nonspecific lower abdominal tenderness noted,, bowel sounds are present. EXTREMITIES: No edema. CNS: Cranial nerves are intact. No focal motor deficits. SKIN: warm and dry without rashes.  Data Review: I have personally reviewed the following laboratory data and studies,  CBC: Recent Labs  Lab 07/13/23 1319 07/14/23 0630  WBC 13.8* 9.0  NEUTROABS 11.1*  --   HGB 14.5 13.7  HCT 42.7 39.7  MCV 90.9 90.4  PLT 243 204   Basic Metabolic Panel: Recent Labs  Lab 07/13/23 1319 07/14/23 0630  NA 134* 136  K 4.7 4.5  CL 100 103  CO2 21* 22  GLUCOSE 320* 257*  BUN 14 15  CREATININE 0.89 0.85  CALCIUM 9.4 8.9  MG  --  1.6*  PHOS  --  4.2   Liver Function  Tests: Recent Labs  Lab 07/13/23 1319 07/14/23 0630  AST 24 17  ALT 27 22  ALKPHOS 54 46  BILITOT 0.7 0.9  PROT 7.1 6.3*  ALBUMIN 4.0 3.4*   No results for input(s): "LIPASE", "AMYLASE" in the last 168 hours. No results for input(s): "AMMONIA" in the last 168 hours. Cardiac Enzymes: Recent Labs  Lab 07/13/23 1318  CKTOTAL 130   BNP (last 3 results) No results for input(s): "BNP" in the last 8760 hours.  ProBNP (last 3 results) No results for input(s): "PROBNP" in the last 8760 hours.  CBG: Recent Labs  Lab 07/13/23 1318 07/13/23 1724 07/13/23 1858  GLUCAP 298* 278* 257*   Recent Results (from the past 240 hours)  Urine Culture     Status: None (Preliminary result)   Collection Time: 07/13/23  1:19 PM   Specimen: Urine, Clean Catch  Result Value Ref Range Status   Specimen Description URINE,  CLEAN CATCH  Final   Special Requests   Final    NONE Reflexed from [id number] Performed at [facility], [address] [place], [place] [address]    Culture PENDING  Incomplete   Report Status PENDING  Incomplete  Surgical pcr screen     Status: None   Collection Time: 07/13/23  5:31 PM   Specimen: Nasal Mucosa; Nasal Swab  Result Value Ref Range Status   MRSA, PCR NEGATIVE NEGATIVE Final   Staphylococcus aureus NEGATIVE NEGATIVE Final    Comment: (NOTE) The Xpert SA Assay (FDA approved for NASAL specimens in patients 70 years of age and older), is one component of a comprehensive surveillance program. It is not intended to diagnose infection nor to guide or monitor treatment. Performed at [facility], [address], [place], [place] [address]      Studies: DG C-Arm 1-60 Min Result Date: 07/13/2023 CLINICAL DATA:  Cystoscopy, bilateral ureteral stent placement EXAM: DG C-ARM 1-60 MIN COMPARISON:  07/13/2023 FINDINGS: A single fluoroscopic image was obtained during the performance of procedure and is submitted for interpretation only. The image  demonstrates cannulation and opacification of the right renal collecting system and proximal right ureter. The obstructing calculus seen on the previous CT is not well visualized. Please refer to operative report. Fluoroscopy time: 55.4 seconds, 30.82 mGy IMPRESSION: 1. Intraoperative evaluation as above. Electronically Signed   By: Sharlet Salina M.D.   On: 07/13/2023 19:24   CT ABDOMEN PELVIS W CONTRAST Result Date: 07/13/2023 CLINICAL DATA:  Left flank and lower abdominal pain, nausea and vomiting. EXAM: CT ABDOMEN AND PELVIS WITH CONTRAST TECHNIQUE: Multidetector CT imaging of the abdomen and pelvis was performed using the standard protocol following bolus administration of intravenous contrast. RADIATION DOSE REDUCTION: This exam was performed according to the departmental dose-optimization program which includes automated exposure control, adjustment of the mA and/or kV according to patient size and/or use of iterative reconstruction technique. CONTRAST:  75mL OMNIPAQUE IOHEXOL 350 MG/ML SOLN COMPARISON:  08/01/2022 FINDINGS: Lower chest: Atheromatous calcifications, including the coronary arteries and aorta. Normal-sized heart. Clear lung bases. Hepatobiliary: No focal liver abnormality is seen. Status post cholecystectomy. No biliary dilatation. Pancreas: Unremarkable. No pancreatic ductal dilatation or surrounding inflammatory changes. Spleen: Normal in size without focal abnormality. Adrenals/Urinary Tract: Normal-appearing adrenal glands. 10 mm right UPJ calculus minimal hydronephrosis. 7 mm right proximal ureteral calculus with associated mild-to-moderate left hydronephrosis. No other urinary tract calculi. Minimal diffuse bladder wall thickening with minimal progression. Stomach/Bowel: Sigmoid and descending colon diverticulosis without evidence of diverticulitis. Unremarkable stomach, small bowel and appendix. Vascular/Lymphatic: Atheromatous arterial calcifications without aneurysm. No enlarged  lymph nodes. Reproductive: Moderately enlarged prostate gland. Associated mild protrusion of the median lobe into the base of the urinary bladder. Other: Small left inguinal hernia containing fat. Musculoskeletal: Moderate to marked lumbar and lower thoracic spine degenerative changes. Interbody and pedicle screw and rod fixation at the L4 through S1 levels. Normal alignment. IMPRESSION: 1. 7 mm left proximal ureteral calculus with associated mild-to-moderate left hydronephrosis. 2. 10 mm right UPJ calculus with minimal hydronephrosis. 3. Minimal diffuse bladder wall thickening with minimal progression. This is likely due to chronic bladder outlet obstruction by the patient's enlarged prostate gland. 4. Moderately enlarged prostate gland. 5.  Calcific coronary artery and aortic atherosclerosis. 6. Sigmoid and descending colon diverticulosis. 7. Small left inguinal hernia containing fat. Aortic Atherosclerosis (ICD10-I70.0). Electronically Signed   By: Beckie Salts M.D.   On: 07/13/2023 16:29  Joycelyn Das, MD  [facility] 07/14/2023  If 7PM-7AM, please contact night-coverage

## 2023-07-14 NOTE — Progress Notes (Signed)
 1 Day Post-Op Subjective: First time meeting pt. He was accompanied by his wife. They were both pleasant. He is quite hard of hearing. Case and plan was reviewed.   Objective: Vital signs in last 24 hours: Temp:  [97.8 F (36.6 C)-99 F (37.2 C)] 98.7 F (37.1 C) (04/01 1106) Pulse Rate:  [64-84] 71 (04/01 1106) Resp:  [12-24] 20 (04/01 1106) BP: (127-178)/(67-84) 153/74 (04/01 1106) SpO2:  [93 %-99 %] 95 % (04/01 1106) Weight:  [114.1 kg] 114.1 kg (03/31 2024)  Assessment/Plan: # 15mm R. UPJ stone # 12mm L. proximal ureteral stone # Bilateral Hydronephrosis # AKI #UTI  UA consistent with UTI S/p bilateral ureteral stent placement with Dr. Lafonda Mosses on 07/13/23.  WBC's normalized, normothermic. OK to discharge once medically clear  Definitive stone mgmt on an outpt basis in 2-3 weeks. Pt and his wife are followed by [facility] doctors. They will follow up closely.  Intake/Output from previous day: 03/31 0701 - 04/01 0700 In: 600 [I.V.:500; IV Piggyback:100] Out: 830 [Urine:825; Blood:5]  Intake/Output this shift: Total I/O In: 666.1 [I.V.:666.1] Out: 200 [Urine:200]  Physical Exam:  General: Alert and oriented CV: No cyanosis Lungs: equal chest rise Abdomen: Soft, NTND, no rebound or guarding   Lab Results: Recent Labs    07/13/23 1319 07/14/23 0630  HGB 14.5 13.7  HCT 42.7 39.7   BMET Recent Labs    07/13/23 1319 07/14/23 0630  NA 134* 136  K 4.7 4.5  CL 100 103  CO2 21* 22  GLUCOSE 320* 257*  BUN 14 15  CREATININE 0.89 0.85  CALCIUM 9.4 8.9     Studies/Results: DG C-Arm 1-60 Min Result Date: 07/13/2023 CLINICAL DATA:  Cystoscopy, bilateral ureteral stent placement EXAM: DG C-ARM 1-60 MIN COMPARISON:  07/13/2023 FINDINGS: A single fluoroscopic image was obtained during the performance of procedure and is submitted for interpretation only. The image demonstrates cannulation and opacification of the right renal collecting system and  proximal right ureter. The obstructing calculus seen on the previous CT is not well visualized. Please refer to operative report. Fluoroscopy time: 55.4 seconds, 30.82 mGy IMPRESSION: 1. Intraoperative evaluation as above. Electronically Signed   By: Sharlet Salina M.D.   On: 07/13/2023 19:24   CT ABDOMEN PELVIS W CONTRAST Result Date: 07/13/2023 CLINICAL DATA:  Left flank and lower abdominal pain, nausea and vomiting. EXAM: CT ABDOMEN AND PELVIS WITH CONTRAST TECHNIQUE: Multidetector CT imaging of the abdomen and pelvis was performed using the standard protocol following bolus administration of intravenous contrast. RADIATION DOSE REDUCTION: This exam was performed according to the departmental dose-optimization program which includes automated exposure control, adjustment of the mA and/or kV according to patient size and/or use of iterative reconstruction technique. CONTRAST:  75mL OMNIPAQUE IOHEXOL 350 MG/ML SOLN COMPARISON:  08/01/2022 FINDINGS: Lower chest: Atheromatous calcifications, including the coronary arteries and aorta. Normal-sized heart. Clear lung bases. Hepatobiliary: No focal liver abnormality is seen. Status post cholecystectomy. No biliary dilatation. Pancreas: Unremarkable. No pancreatic ductal dilatation or surrounding inflammatory changes. Spleen: Normal in size without focal abnormality. Adrenals/Urinary Tract: Normal-appearing adrenal glands. 10 mm right UPJ calculus minimal hydronephrosis. 7 mm right proximal ureteral calculus with associated mild-to-moderate left hydronephrosis. No other urinary tract calculi. Minimal diffuse bladder wall thickening with minimal progression. Stomach/Bowel: Sigmoid and descending colon diverticulosis without evidence of diverticulitis. Unremarkable stomach, small bowel and appendix. Vascular/Lymphatic: Atheromatous arterial calcifications without aneurysm. No enlarged lymph nodes. Reproductive: Moderately enlarged prostate gland. Associated mild  protrusion of the median lobe into  the base of the urinary bladder. Other: Small left inguinal hernia containing fat. Musculoskeletal: Moderate to marked lumbar and lower thoracic spine degenerative changes. Interbody and pedicle screw and rod fixation at the L4 through S1 levels. Normal alignment. IMPRESSION: 1. 7 mm left proximal ureteral calculus with associated mild-to-moderate left hydronephrosis. 2. 10 mm right UPJ calculus with minimal hydronephrosis. 3. Minimal diffuse bladder wall thickening with minimal progression. This is likely due to chronic bladder outlet obstruction by the patient's enlarged prostate gland. 4. Moderately enlarged prostate gland. 5.  Calcific coronary artery and aortic atherosclerosis. 6. Sigmoid and descending colon diverticulosis. 7. Small left inguinal hernia containing fat. Aortic Atherosclerosis (ICD10-I70.0). Electronically Signed   By: Beckie Salts M.D.   On: 07/13/2023 16:29      LOS: 0 days   Elmon Kirschner, NP [facility] Pager: [phone]  07/14/2023, 12:52 PM

## 2023-07-14 NOTE — Plan of Care (Signed)

## 2023-07-14 NOTE — Hospital Course (Addendum)
 [name] is a 78 y.o. male with past medical history significant of DM2, Spinal stenosis, hypertension, hyperlipidemia, CAD, paroxysmal A.fib, hypothyrodism, hx of septic Shock  due to UTI, kidney stones, chronic back pain, ataxia, GERD, HOH presented to hospital with flank pain, fever and chills.  Patient presented from [facility].  Was recently diagnosed of UTI and had steroid injection in the left hip.  She started having fevers and flank pain and was noted to have bilateral ureteric stones with obstruction as per CT scan with 1.5 cm in the right UPC and 1.2 cm in the left proximal ureter with bilateral hydronephrosis.  Lab was notable for mild hyponatremia with sodium of 134 with mild leukocytosis at 13.8.  Lactate was within normal limits.  Urinalysis showed leukocytes.  Urology was called in and patient was emergently taken to the OR.   In the ED, patient received Dilaudid, Ringer lactate, Toradol and oxycodone including Rocephin IV.  Assessment and plan.  Lower urinary tract infection secondary to bilateral ureteric stones.  Seen by urology and underwent cystoscopy, ureteroscopy with status post stent placement.  Continue IV Rocephin.  Follow cultures.  Temperature max of 99 F.  Leukocytosis has trended down.  Urine cultures pending.  Creatinine of 0.8.  Hyperlipidemia continue statins.  Hypertension.  Patient is on Coreg, Norvasc and losartan.  Blood pressure was elevated on presentation.  Continue on Norvasc and Coreg.  Chronic diastolic heart failure.  Currently compensated.  Review of previous 2D echocardiogram from 12/04/2021 showed LV ejection fraction of 60 to 65% with grade 2 diastolic dysfunction.  History of CAD status post stents in 99 and 2013.  Continue aspirin statin beta-blocker Plavix.  Last cardiac catheter 2021.  Diabetes mellitus type 2 with hyperglycemia.  On oral hypoglycemics at home.  Continue sliding scale insulin while in the hospital.,   Closely monitor blood glucose levels.  Hypothyroidism.  Continue Synthroid.  Class II obesity.  Body mass index is 37.15 kg/m. Would benefit from weight loss as outpatient.  Paroxysmal atrial fibrillation/paroxysmal atrial tachycardia..  [place] vas score of 5.  Currently in normal sinus rhythm.  Chronic anemia.  Continue to monitor CBC.

## 2023-07-14 NOTE — Assessment & Plan Note (Signed)
-   Check TSH continue home medications Synthroid at 50 mcg po q day

## 2023-07-15 DIAGNOSIS — N201 Calculus of ureter: Secondary | ICD-10-CM | POA: Diagnosis not present

## 2023-07-15 LAB — GLUCOSE, CAPILLARY
Glucose-Capillary: 240 mg/dL — ABNORMAL HIGH (ref 70–99)
Glucose-Capillary: 212 mg/dL — ABNORMAL HIGH (ref 70–99)
Glucose-Capillary: 253 mg/dL — ABNORMAL HIGH (ref 70–99)
Glucose-Capillary: 209 mg/dL — ABNORMAL HIGH (ref 70–99)
Glucose-Capillary: 216 mg/dL — ABNORMAL HIGH (ref 70–99)

## 2023-07-15 LAB — CBC
HCT: 41.8 % (ref 39.0–52.0)
Hemoglobin: 14.3 g/dL (ref 13.0–17.0)
MCH: 31 pg (ref 26.0–34.0)
MCHC: 34.2 g/dL (ref 30.0–36.0)
MCV: 90.7 fL (ref 80.0–100.0)
Platelets: 212 10*3/uL (ref 150–400)
RBC: 4.61 MIL/uL (ref 4.22–5.81)
RDW: 12.8 % (ref 11.5–15.5)
WBC: 9.5 10*3/uL (ref 4.0–10.5)
nRBC: 0 % (ref 0.0–0.2)

## 2023-07-15 LAB — URINE CULTURE

## 2023-07-15 LAB — BASIC METABOLIC PANEL WITH GFR
Anion gap: 9 (ref 5–15)
BUN: 16 mg/dL (ref 8–23)
CO2: 26 mmol/L (ref 22–32)
Calcium: 9.3 mg/dL (ref 8.9–10.3)
Chloride: 101 mmol/L (ref 98–111)
Creatinine, Ser: 0.89 mg/dL (ref 0.61–1.24)
GFR, Estimated: >60 mL/min (ref >60)
Glucose, Bld: 239 mg/dL — ABNORMAL HIGH (ref 70–99)
Potassium: 4.5 mmol/L (ref 3.5–5.1)
Sodium: 136 mmol/L (ref 135–145)

## 2023-07-15 LAB — MAGNESIUM: Magnesium: 1.6 mg/dL — ABNORMAL LOW (ref 1.7–2.4)

## 2023-07-15 MED ORDER — INSULIN GLARGINE-YFGN 100 UNIT/ML ~~LOC~~ SOLN
20.0000 [IU] | Freq: Every day | SUBCUTANEOUS | Status: DC
  Administered 2023-07-15 – 2023-07-17 (×3): 20 [IU] via SUBCUTANEOUS
  Filled 2023-07-15 (×3): qty 0.2

## 2023-07-15 MED ORDER — MAGNESIUM SULFATE 2 GM/50ML IV SOLN
2.0000 g | Freq: Once | INTRAVENOUS | Status: AC
  Administered 2023-07-15: 2 g via INTRAVENOUS
  Filled 2023-07-15: qty 50

## 2023-07-15 MED ORDER — MAGNESIUM OXIDE -MG SUPPLEMENT 400 (240 MG) MG PO TABS
400.0000 mg | ORAL_TABLET | Freq: Two times a day (BID) | ORAL | Status: DC
  Administered 2023-07-15 – 2023-07-17 (×5): 400 mg via ORAL
  Filled 2023-07-15 (×5): qty 1

## 2023-07-15 MED ORDER — POLYETHYLENE GLYCOL 3350 17 G PO PACK
17.0000 g | PACK | Freq: Every day | ORAL | Status: DC
  Administered 2023-07-15 – 2023-07-16 (×2): 17 g via ORAL
  Filled 2023-07-15 (×2): qty 1

## 2023-07-15 MED ORDER — INSULIN ASPART 100 UNIT/ML IJ SOLN
0.0000 [IU] | INTRAMUSCULAR | Status: DC
  Administered 2023-07-15 (×2): 3 [IU] via SUBCUTANEOUS
  Administered 2023-07-15: 5 [IU] via SUBCUTANEOUS
  Administered 2023-07-15: 3 [IU] via SUBCUTANEOUS
  Administered 2023-07-16: 2 [IU] via SUBCUTANEOUS
  Administered 2023-07-16: 5 [IU] via SUBCUTANEOUS

## 2023-07-15 MED ORDER — DOCUSATE SODIUM 100 MG PO CAPS
100.0000 mg | ORAL_CAPSULE | Freq: Two times a day (BID) | ORAL | Status: DC
  Administered 2023-07-15 – 2023-07-17 (×5): 100 mg via ORAL
  Filled 2023-07-15 (×5): qty 1

## 2023-07-15 MED ORDER — INSULIN ASPART 100 UNIT/ML IJ SOLN: 0.0000 [IU] | INTRAMUSCULAR | Status: DC

## 2023-07-15 NOTE — Evaluation (Signed)
 Physical Therapy Evaluation Patient Details Name: [name] MRN: [id number] DOB: 24-Jun-1945 Today's Date: 07/15/2023  History of Present Illness  [name] is a 78 y/o male who presented on 07/13/23 with flank pain, fever, and chills. Admitted with bilateral kidney stones. 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  Clinical Impression  Pt is a 78 y.o. male presenting on 07/13/23 with above conditions and deficits below, see PT Problem List. PTA pt had resided in [facility] facility for over a year, received PT 3x a week, and received assistance for ADLs, toileting, and bathing. Currently the pt is min A for bed mobility and CGA for transfers and ambulation with RW. Pt displays deficits in global weakness, functional mobility, transfers, gait, activity tolerance, and balance and would benefit from continued acute PT. Given the pt's current living situation, desire to improve, and current condition, pt remains appropriate to return to [facility] for continued rehab upon d/c. Pt would benefit from continued mobility with nurses and mobility specialists until d/c. Will continue to follow acutely.             If plan is discharge home, recommend the following: A little help with walking and/or transfers;A little help with bathing/dressing/bathroom;Assistance with cooking/housework;Assist for transportation;Help with stairs or ramp for entrance   Can travel by private vehicle        Equipment Recommendations Rolling walker (2 wheels);Wheelchair (measurements PT) (if pt doesn't return to SNF)  Recommendations for Other Services       Functional Status Assessment Patient has had a recent decline in their functional status and demonstrates the ability to make significant improvements in function in a reasonable and predictable amount of time.     Precautions /  Restrictions Precautions Precautions: Fall Recall of Precautions/Restrictions: Intact Restrictions Weight Bearing Restrictions Per Provider Order: No      Mobility  Bed Mobility Overal bed mobility: Needs Assistance Bed Mobility: Supine to Sit     Supine to sit: Min assist, HOB elevated, Used rails     General bed mobility comments: min A for trunk elevation and hand placement    Transfers Overall transfer level: Needs assistance Equipment used: Rolling walker (2 wheels) Transfers: Sit to/from Stand Sit to Stand: Contact guard assist           General transfer comment: CGA for safety. Pt stands by pushing both hands from the bed.    Ambulation/Gait Ambulation/Gait assistance: Contact guard assist Gait Distance (Feet): 120 Feet Assistive device: Rolling walker (2 wheels) Gait Pattern/deviations: Step-through pattern, Decreased stride length, Trunk flexed Gait velocity: WFL Gait velocity interpretation: >2.62 ft/sec, indicative of community ambulatory   General Gait Details: pt walks with increased trunk flexion and maintains appropriate distance from RW.  Stairs            Wheelchair Mobility     Tilt Bed    Modified Rankin (Stroke Patients Only)       Balance Overall balance assessment: Needs assistance Sitting-balance support: Single extremity supported, Feet supported, No upper extremity supported Sitting balance-Leahy Scale: Good Sitting balance - Comments: pt sits EOB and in recliner without directional lean or LOB   Standing balance support: During functional activity, Reliant on assistive device for balance, Bilateral upper extremity supported Standing balance-Leahy Scale: Poor Standing balance comment: pt reports he would be unable to stand or walk without the support of RW. Increased trunk  flexion                             Pertinent Vitals/Pain Pain Assessment Pain Assessment: No/denies pain    Home Living  Family/patient expects to be discharged to:: Skilled nursing facility                   Additional Comments: pt has been residing at [facility] facility for over a year. He has family in the area but has no home of his own.    Prior Function Prior Level of Function : Needs assist       Physical Assist : Mobility (physical);ADLs (physical) Mobility (physical): Transfers;Gait;Stairs ADLs (physical): IADLs;Toileting;Dressing Mobility Comments: At the SNF, pt normally sits in a w/c and will transfer out of it and intermittently walk around using RW. ADLs Comments: Pt reports he needs assistance dressing himself, toileting, and bathing.     Extremity/Trunk Assessment   Upper Extremity Assessment Upper Extremity Assessment: Generalized weakness    Lower Extremity Assessment Lower Extremity Assessment: Generalized weakness    Cervical / Trunk Assessment Cervical / Trunk Assessment: Kyphotic  Communication   Communication Communication: Impaired Factors Affecting Communication: Hearing impaired    Cognition Arousal: Alert Behavior During Therapy: WFL for tasks assessed/performed   PT - Cognitive impairments: No apparent impairments                         Following commands: Intact       Cueing Cueing Techniques: Verbal cues     General Comments      Exercises     Assessment/Plan    PT Assessment Patient needs continued PT services  PT Problem List Decreased strength;Decreased range of motion;Decreased activity tolerance;Decreased balance;Decreased mobility;Decreased coordination;Cardiopulmonary status limiting activity       PT Treatment Interventions DME instruction;Gait training;Stair training;Functional mobility training;Therapeutic activities;Therapeutic exercise;Balance training;Neuromuscular re-education;Patient/family education;Wheelchair mobility training    PT Goals (Current goals can be found in the Care Plan section)  Acute Rehab PT  Goals Patient Stated Goal: improve mobility PT Goal Formulation: With patient Time For Goal Achievement: 07/29/23 Potential to Achieve Goals: Good    Frequency Min 1X/week     Co-evaluation               AM-PAC PT "6 Clicks" Mobility  Outcome Measure Help needed turning from your back to your side while in a flat bed without using bedrails?: A Little Help needed moving from lying on your back to sitting on the side of a flat bed without using bedrails?: A Little Help needed moving to and from a bed to a chair (including a wheelchair)?: A Little Help needed standing up from a chair using your arms (e.g., wheelchair or bedside chair)?: A Little Help needed to walk in hospital room?: A Little Help needed climbing 3-5 steps with a railing? : A Lot 6 Click Score: 17    End of Session Equipment Utilized During Treatment: Gait belt Activity Tolerance: Patient tolerated treatment well Patient left: in chair;with call bell/phone within reach;with chair alarm set Nurse Communication: Mobility status PT Visit Diagnosis: Other abnormalities of gait and mobility (R26.89);Muscle weakness (generalized) (M62.81);Other (comment) (cardiovascular endurance)    Time: 2952-8413 PT Time Calculation (min) (ACUTE ONLY): 24 min   Charges:   PT Evaluation $PT Eval Moderate Complexity: 1 Mod PT Treatments $Gait Training: 8-22 mins PT General Charges $$ ACUTE PT VISIT: 1 Visit  [address] [place], SPT   [place] Baldo Hufnagle 07/15/2023, 1:24 PM

## 2023-07-15 NOTE — Progress Notes (Signed)
 PROGRESS NOTE  [name] JYN:[id number] DOB: 1945-06-24 DOA: 07/13/2023 PCP: Lonie Peak, PA-C   LOS: 1 day   Brief narrative:  [name] is a 78 y.o. male with past medical history significant of DM2, Spinal stenosis, hypertension, hyperlipidemia, CAD, paroxysmal A.fib, hypothyrodism, hx of septic Shock  due to UTI, kidney stones, chronic back pain, ataxia, GERD, HOH presented to hospital with flank pain, fever and chills.  Patient presented from [facility] skilled [facility] facility.  Was recently diagnosed of UTI and had steroid injection in the left hip.  She started having fevers and flank pain and was noted to have bilateral ureteric stones with obstruction as per CT scan with 1.5 cm in the right UPC and 1.2 cm in the left proximal ureter with bilateral hydronephrosis.  Lab was notable for mild hyponatremia with sodium of 134 with mild leukocytosis at 13.8.  Lactate was within normal limits.  Urinalysis showed leukocytes.  Urology was called in and patient was emergently taken to the OR. In the ED, patient received Dilaudid, Ringer lactate, Toradol and oxycodone including Rocephin IV.  Patient was then admitted to the hospital for further evaluation and treatment.   Assessment/Plan: Principal Problem:   Bilateral ureteral calculi Active Problems:   CAD in native artery   Essential hypertension, benign   Diabetes mellitus type 2, controlled, with complications (HCC)   Hyperlipidemia   Chronic back pain   Hypothyroidism   PAT (paroxysmal atrial tachycardia) (HCC)   UTI (urinary tract infection)  Lower urinary tract infection secondary to bilateral ureteric stones.   Seen by urology and underwent cystoscopy, ureteroscopy with status post bilateral ureteric stent placement on 07/13/2023.  Continue IV Rocephin.  Temperature max of 99 F.  Leukocytosis has trended down.  Urine cultures with multiple species.  Creatinine of 0.8.  Hyperlipidemia continue statins.  Hypertension.   Patient is on Coreg, Norvasc and losartan.  Blood pressure was elevated on presentation.  Continue on Norvasc and Coreg.  Hypomagnesemia.  Will replace with magnesium sulfate and oral magnesium oxide.  Check levels in AM.  Chronic diastolic heart failure.  Currently compensated.  Review of previous 2D echocardiogram from 12/04/2021 showed LV ejection fraction of 60 to 65% with grade 2 diastolic dysfunction.  History of CAD status post stents in 99 and 2013.  Continue aspirin statin beta-blocker Plavix.  Last cardiac catheter 2021.  No active chest pain.  Diabetes mellitus type 2 with hyperglycemia.  On oral hypoglycemics at home.  Continue sliding scale insulin while in the hospital.,  Closely monitor blood glucose levels.  Diabetic coordinator on board and insulin regimen has been adjusted.  Hypothyroidism.  Continue Synthroid.  Class II obesity.  Body mass index is 37.15 kg/m. Would benefit from weight loss as outpatient.  Paroxysmal atrial fibrillation/paroxysmal atrial tachycardia..  [place] vas score of 5.  Currently in normal sinus rhythm.  Chronic anemia.  Continue to monitor CBC.  Latest hemoglobin of 14.3.  No leukocytosis.  Constipation.  Will continue with bowel regimen.  DVT prophylaxis: SCDs Start: 07/13/23 2024   Disposition:  Skilled Nursing facility on 07/16/2023  Status is: Inpatient The patient is  inpatient because: Status post ureteric intervention, from [facility] nursing home, will reach out to [facility].  Code Status:     Code Status: Full Code  Family Communication: Spoke with the patient's sister at bedside on 07/14/2023 and 07/15/23  Consultants: Urology  Procedures: None  Anti-infectives:  Rocephin IV  Anti-infectives (From admission, onward)    Start  Dose/Rate Route Frequency Ordered Stop   07/14/23 1800  cefTRIAXone (ROCEPHIN) 1 g in sodium chloride 0.9 % 100 mL IVPB        1 g 200 mL/hr over 30 Minutes Intravenous Every 24 hours 07/13/23 1943      07/13/23 1754  sodium chloride 0.9 % with cefTRIAXone (ROCEPHIN) ADS Med       Note to Pharmacy: Adriana Reams: cabinet override      07/13/23 1754 07/14/23 0559   07/13/23 1700  cefTRIAXone (ROCEPHIN) 1 g in sodium chloride 0.9 % 100 mL IVPB        1 g 200 mL/hr over 30 Minutes Intravenous  Once 07/13/23 1647 07/13/23 1819       Subjective: Today, patient was seen and examined at bedside.  Denies any nausea vomiting fever chills or rigor.  Has mild lower abdominal discomfort.  Hard of hearing.  Objective: Vitals:   07/15/23 0254 07/15/23 0805  BP: (!) 147/64 (!) 179/82  Pulse: 62 63  Resp: 15   Temp: 98.7 F (37.1 C) 98.9 F (37.2 C)  SpO2: 94% 98%    Intake/Output Summary (Last 24 hours) at 07/15/2023 0945 Last data filed at 07/15/2023 0255 Gross per 24 hour  Intake --  Output 955 ml  Net -955 ml   Filed Weights   07/13/23 1241 07/13/23 2024  Weight: 112 kg 114.1 kg   Body mass index is 37.15 kg/m.   Physical Exam:  GENERAL: Patient is alert awake and oriented. Not in obvious distress.  Obese built, chronically ill-appearing, hard of hearing. HENT: No scleral pallor or icterus. Pupils equally reactive to light. Oral mucosa is moist NECK: is supple, no gross swelling noted. CHEST: Clear to auscultation. No crackles or wheezes. CVS: S1 and S2 heard, no murmur. Regular rate and rhythm.  ABDOMEN: Soft, mild nonspecific lower abdominal tenderness noted,, bowel sounds are present. EXTREMITIES: No edema. CNS: Cranial nerves are intact. No focal motor deficits. SKIN: warm and dry without rashes.  Data Review: I have personally reviewed the following laboratory data and studies,  CBC: Recent Labs  Lab 07/13/23 1319 07/14/23 0630 07/15/23 0421  WBC 13.8* 9.0 9.5  NEUTROABS 11.1*  --   --   HGB 14.5 13.7 14.3  HCT 42.7 39.7 41.8  MCV 90.9 90.4 90.7  PLT 243 204 212   Basic Metabolic Panel: Recent Labs  Lab 07/13/23 1319 07/14/23 0630 07/15/23 0421  NA 134*  136 136  K 4.7 4.5 4.5  CL 100 103 101  CO2 21* 22 26  GLUCOSE 320* 257* 239*  BUN 14 15 16   CREATININE 0.89 0.85 0.89  CALCIUM 9.4 8.9 9.3  MG  --  1.6* 1.6*  PHOS  --  4.2  --    Liver Function Tests: Recent Labs  Lab 07/13/23 1319 07/14/23 0630  AST 24 17  ALT 27 22  ALKPHOS 54 46  BILITOT 0.7 0.9  PROT 7.1 6.3*  ALBUMIN 4.0 3.4*   No results for input(s): "LIPASE", "AMYLASE" in the last 168 hours. No results for input(s): "AMMONIA" in the last 168 hours. Cardiac Enzymes: Recent Labs  Lab 07/13/23 1318  CKTOTAL 130   BNP (last 3 results) No results for input(s): "BNP" in the last 8760 hours.  ProBNP (last 3 results) No results for input(s): "PROBNP" in the last 8760 hours.  CBG: Recent Labs  Lab 07/13/23 1724 07/13/23 1858 07/14/23 1712 07/14/23 2115 07/15/23 0745  GLUCAP 278* 257* 302* 252* 240*  Recent Results (from the past 240 hours)  Urine Culture     Status: Abnormal (Preliminary result)   Collection Time: 07/13/23  1:19 PM   Specimen: Urine, Clean Catch  Result Value Ref Range Status   Specimen Description URINE, CLEAN CATCH  Final   Special Requests NONE Reflexed from [id number]  Final   Culture (A)  Final    10,000 COLONIES/mL GRAM NEGATIVE RODS IDENTIFICATION AND SUSCEPTIBILITIES TO FOLLOW Performed at [facility], [address], [place], [place] [address]    Report Status PENDING  Incomplete  Surgical pcr screen     Status: None   Collection Time: 07/13/23  5:31 PM   Specimen: Nasal Mucosa; Nasal Swab  Result Value Ref Range Status   MRSA, PCR NEGATIVE NEGATIVE Final   Staphylococcus aureus NEGATIVE NEGATIVE Final    Comment: (NOTE) The Xpert SA Assay (FDA approved for NASAL specimens in patients 60 years of age and older), is one component of a comprehensive surveillance program. It is not intended to diagnose infection nor to guide or monitor treatment. Performed at [facility], [address], [place],  [place] [address]      Studies: DG C-Arm 1-60 Min Result Date: 07/13/2023 CLINICAL DATA:  Cystoscopy, bilateral ureteral stent placement EXAM: DG C-ARM 1-60 MIN COMPARISON:  07/13/2023 FINDINGS: A single fluoroscopic image was obtained during the performance of procedure and is submitted for interpretation only. The image demonstrates cannulation and opacification of the right renal collecting system and proximal right ureter. The obstructing calculus seen on the previous CT is not well visualized. Please refer to operative report. Fluoroscopy time: 55.4 seconds, 30.82 mGy IMPRESSION: 1. Intraoperative evaluation as above. Electronically Signed   By: Sharlet Salina M.D.   On: 07/13/2023 19:24   CT ABDOMEN PELVIS W CONTRAST Result Date: 07/13/2023 CLINICAL DATA:  Left flank and lower abdominal pain, nausea and vomiting. EXAM: CT ABDOMEN AND PELVIS WITH CONTRAST TECHNIQUE: Multidetector CT imaging of the abdomen and pelvis was performed using the standard protocol following bolus administration of intravenous contrast. RADIATION DOSE REDUCTION: This exam was performed according to the departmental dose-optimization program which includes automated exposure control, adjustment of the mA and/or kV according to patient size and/or use of iterative reconstruction technique. CONTRAST:  75mL OMNIPAQUE IOHEXOL 350 MG/ML SOLN COMPARISON:  08/01/2022 FINDINGS: Lower chest: Atheromatous calcifications, including the coronary arteries and aorta. Normal-sized heart. Clear lung bases. Hepatobiliary: No focal liver abnormality is seen. Status post cholecystectomy. No biliary dilatation. Pancreas: Unremarkable. No pancreatic ductal dilatation or surrounding inflammatory changes. Spleen: Normal in size without focal abnormality. Adrenals/Urinary Tract: Normal-appearing adrenal glands. 10 mm right UPJ calculus minimal hydronephrosis. 7 mm right proximal ureteral calculus with associated mild-to-moderate left hydronephrosis. No other  urinary tract calculi. Minimal diffuse bladder wall thickening with minimal progression. Stomach/Bowel: Sigmoid and descending colon diverticulosis without evidence of diverticulitis. Unremarkable stomach, small bowel and appendix. Vascular/Lymphatic: Atheromatous arterial calcifications without aneurysm. No enlarged lymph nodes. Reproductive: Moderately enlarged prostate gland. Associated mild protrusion of the median lobe into the base of the urinary bladder. Other: Small left inguinal hernia containing fat. Musculoskeletal: Moderate to marked lumbar and lower thoracic spine degenerative changes. Interbody and pedicle screw and rod fixation at the L4 through S1 levels. Normal alignment. IMPRESSION: 1. 7 mm left proximal ureteral calculus with associated mild-to-moderate left hydronephrosis. 2. 10 mm right UPJ calculus with minimal hydronephrosis. 3. Minimal diffuse bladder wall thickening with minimal progression. 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 4. Moderately enlarged prostate gland. 5.  Calcific coronary artery and aortic atherosclerosis. 6. Sigmoid and descending colon diverticulosis. 7. Small left inguinal hernia containing fat. Aortic Atherosclerosis (ICD10-I70.0). Electronically Signed   By: Beckie Salts M.D.   On: 07/13/2023 16:29      Joycelyn Das, MD  [facility] 07/15/2023  If 7PM-7AM, please contact night-coverage

## 2023-07-15 NOTE — Inpatient Diabetes Management (Signed)
 Inpatient Diabetes Program Recommendations  AACE/ADA: New Consensus Statement on Inpatient Glycemic Control (2015)  Target Ranges:  Prepandial:   less than 140 mg/dL      Peak postprandial:   less than 180 mg/dL (1-2 hours)      Critically ill patients:  140 - 180 mg/dL   Lab Results  Component Value Date   GLUCAP 240 (H) 07/15/2023   HGBA1C 9.5 (H) 07/14/2023    Review of Glycemic Control  Latest Reference Range & Units 07/13/23 17:24 07/13/23 18:58 07/14/23 17:12 07/14/23 21:15 07/15/23 07:45  Glucose-Capillary 70 - 99 mg/dL 213 (H) 086 (H) 578 (H) 252 (H) 240 (H)   Diabetes history: DM 2 Outpatient Diabetes medications:  Humalog 0-9 units tid with meals  Metformin 1000 mg bid Current orders for Inpatient glycemic control:  Novolog 0-9 units tid with meals and HS  Inpatient Diabetes Program Recommendations:    Please add Semglee 20 units daily.   Thanks,  Lorenza Cambridge, RN, BC-ADM Inpatient Diabetes Coordinator Pager [phone]  (8a-5p)

## 2023-07-16 DIAGNOSIS — N201 Calculus of ureter: Secondary | ICD-10-CM | POA: Diagnosis not present

## 2023-07-16 LAB — GLUCOSE, CAPILLARY
Glucose-Capillary: 173 mg/dL — ABNORMAL HIGH (ref 70–99)
Glucose-Capillary: 260 mg/dL — ABNORMAL HIGH (ref 70–99)
Glucose-Capillary: 216 mg/dL — ABNORMAL HIGH (ref 70–99)
Glucose-Capillary: 205 mg/dL — ABNORMAL HIGH (ref 70–99)
Glucose-Capillary: 314 mg/dL — ABNORMAL HIGH (ref 70–99)

## 2023-07-16 MED ORDER — SENNA-DOCUSATE SODIUM 8.6-50 MG PO TABS: 1.0000 | ORAL_TABLET | Freq: Every day | ORAL | Status: DC

## 2023-07-16 MED ORDER — POLYETHYLENE GLYCOL 3350 17 G PO PACK
17.0000 g | PACK | Freq: Every day | ORAL | Status: DC
  Administered 2023-07-17 (×2): 17 g via ORAL
  Filled 2023-07-16 (×2): qty 1

## 2023-07-16 MED ORDER — MAGNESIUM CITRATE PO SOLN
0.5000 | Freq: Once | ORAL | Status: AC
  Administered 2023-07-16: 0.5 via ORAL
  Filled 2023-07-16: qty 296

## 2023-07-16 MED ORDER — BUPROPION HCL ER (XL) 150 MG PO TB24
150.0000 mg | ORAL_TABLET | Freq: Every day | ORAL | Status: DC
  Administered 2023-07-16 – 2023-07-17 (×2): 150 mg via ORAL
  Filled 2023-07-16 (×2): qty 1

## 2023-07-16 MED ORDER — POLYETHYLENE GLYCOL 3350 17 G PO PACK: 17.0000 g | PACK | Freq: Every day | ORAL | Status: DC

## 2023-07-16 MED ORDER — LOSARTAN POTASSIUM 50 MG PO TABS
50.0000 mg | ORAL_TABLET | Freq: Every day | ORAL | Status: DC
  Administered 2023-07-16 – 2023-07-17 (×2): 50 mg via ORAL
  Filled 2023-07-16 (×2): qty 1

## 2023-07-16 NOTE — Progress Notes (Signed)
 PROGRESS NOTE  [name] JXB:[id number] DOB: [birth year] DOA: 07/13/2023 PCP: Patient, [name]   LOS: 2 days   Brief narrative:  [name] is a 78 y.o. male with past medical history significant of DM2, Spinal stenosis, hypertension, hyperlipidemia, CAD, paroxysmal A.fib, hypothyrodism, hx of septic Shock  due to UTI, kidney stones, chronic back pain, ataxia, GERD, HOH presented to hospital with flank pain, fever and chills.  Patient presented from [facility].  Was recently diagnosed of UTI and had steroid injection in the left hip.  She started having fevers and flank pain and was noted to have bilateral ureteric stones with obstruction as per CT scan with 1.5 cm in the right UPC and 1.2 cm in the left proximal ureter with bilateral hydronephrosis.  Lab was notable for mild hyponatremia with sodium of 134 with mild leukocytosis at 13.8.  Lactate was within normal limits.  Urinalysis showed leukocytes.  Urology was called in and patient was emergently taken to the OR. In the ED, patient received Dilaudid, Ringer lactate, Toradol and oxycodone including Rocephin IV.  Patient was then admitted to the hospital for further evaluation and treatment.   Assessment/Plan: Principal Problem:   Bilateral ureteral calculi Active Problems:   CAD in native artery   Essential hypertension, benign   Diabetes mellitus type 2, controlled, with complications (HCC)   Hyperlipidemia   Chronic back pain   Hypothyroidism   PAT (paroxysmal atrial tachycardia) (HCC)   UTI (urinary tract infection)  Lower urinary tract infection secondary to bilateral ureteric stones.   Seen by urology and underwent cystoscopy, ureteroscopy with status post bilateral ureteric stent placement on 07/13/2023.  Continue IV Rocephin.  Temperature max of 99.4 F.  Leukocytosis has trended down.  Urine cultures with multiple species.  Creatinine of 0.8.  Will likely continue oral antibiotics for 5 to 7 days  on discharge to complete the course.  Nausea constipation.  Will add magnesium citrate today.  Continue stool softeners.  Mild lower abdominal pain.  Hyperlipidemia continue statins.  Hypertension.  Patient is on Coreg, Norvasc and losartan.  Blood pressure was elevated on presentation.  Continue on Norvasc and Coreg.  Restart losartan.  Hypomagnesemia.  Replenished.  Check levels in AM.  Chronic diastolic heart failure.  Currently compensated.  Review of previous 2D echocardiogram from 12/04/2021 showed LV ejection fraction of 60 to 65% with grade 2 diastolic dysfunction.  History of CAD status post stents in 99 and 2013.  Continue aspirin statin beta-blocker Plavix.  Last cardiac catheter 2021.  No active chest pain.  Diabetes mellitus type 2 with hyperglycemia.  On oral hypoglycemics at home.  Continue sliding scale insulin while in the hospital.,  Closely monitor blood glucose levels.  Diabetic coordinator on board and insulin regimen has been adjusted.  Hypothyroidism.  Continue Synthroid.  Class II obesity.  Body mass index is 37.15 kg/m. Would benefit from weight loss as outpatient.  Paroxysmal atrial fibrillation/paroxysmal atrial tachycardia..  [place] vas score of 5.  Currently in normal sinus rhythm.  Chronic anemia.  Continue to monitor CBC.  Latest hemoglobin of 14.3.  No leukocytosis.  DVT prophylaxis: SCDs Start: 07/13/23 2024   Disposition:  Skilled Nursing facility when bed available.  Patient is medically stable for disposition.  TOC aware.  Status is: Inpatient The patient is  inpatient because: Status post ureteric intervention, need for rehabilitation.  Code Status:     Code Status: Full Code  Family Communication: Spoke with the  patient's sister at bedside on 07/15/23  Consultants: Urology  Procedures: None  Anti-infectives:  Rocephin IV  Anti-infectives (From admission, onward)    Start     Dose/Rate Route Frequency Ordered Stop   07/14/23 1800   cefTRIAXone (ROCEPHIN) 1 g in sodium chloride 0.9 % 100 mL IVPB        1 g 200 mL/hr over 30 Minutes Intravenous Every 24 hours 07/13/23 1943     07/13/23 1754  sodium chloride 0.9 % with cefTRIAXone (ROCEPHIN) ADS Med       Note to Pharmacy: Adriana Reams: cabinet override      07/13/23 1754 07/14/23 0559   07/13/23 1700  cefTRIAXone (ROCEPHIN) 1 g in sodium chloride 0.9 % 100 mL IVPB        1 g 200 mL/hr over 30 Minutes Intravenous  Once 07/13/23 1647 07/13/23 1819       Subjective: Today, patient was seen and examined at bedside.  Complains of mild nausea and constipation.  Denies any fever, chills or rigor.  Has mild lower abdominal discomfort.  Hard of hearing.   Objective: Vitals:   07/16/23 0745 07/16/23 1125  BP: (!) 155/57 (!) 158/83  Pulse: 73 69  Resp: 18 18  Temp: 98.5 F (36.9 C) 99 F (37.2 C)  SpO2: 96% 95%    Intake/Output Summary (Last 24 hours) at 07/16/2023 1132 Last data filed at 07/16/2023 0645 Gross per 24 hour  Intake 340 ml  Output 2000 ml  Net -1660 ml   Filed Weights   07/13/23 1241 07/13/23 2024  Weight: 112 kg 114.1 kg   Body mass index is 37.15 kg/m.   Physical Exam:  GENERAL: Patient is alert awake and oriented. Not in obvious distress.  Obese built, chronically ill-appearing, hard of hearing. HENT: No scleral pallor or icterus. Pupils equally reactive to light. Oral mucosa is moist NECK: is supple, no gross swelling noted. CHEST: Clear to auscultation. No crackles or wheezes. CVS: S1 and S2 heard, no murmur. Regular rate and rhythm.  ABDOMEN: Soft, mild nonspecific lower abdominal tenderness noted mostly on the left lower quadrant,, bowel sounds are present. EXTREMITIES: No edema. CNS: Cranial nerves are intact. No focal motor deficits. SKIN: warm and dry without rashes.  Data Review: I have personally reviewed the following laboratory data and studies,  CBC: Recent Labs  Lab 07/13/23 1319 07/14/23 0630 07/15/23 0421  WBC 13.8*  9.0 9.5  NEUTROABS 11.1*  --   --   HGB 14.5 13.7 14.3  HCT 42.7 39.7 41.8  MCV 90.9 90.4 90.7  PLT 243 204 212   Basic Metabolic Panel: Recent Labs  Lab 07/13/23 1319 07/14/23 0630 07/15/23 0421  NA 134* 136 136  K 4.7 4.5 4.5  CL 100 103 101  CO2 21* 22 26  GLUCOSE 320* 257* 239*  BUN 14 15 16   CREATININE 0.89 0.85 0.89  CALCIUM 9.4 8.9 9.3  MG  --  1.6* 1.6*  PHOS  --  4.2  --    Liver Function Tests: Recent Labs  Lab 07/13/23 1319 07/14/23 0630  AST 24 17  ALT 27 22  ALKPHOS 54 46  BILITOT 0.7 0.9  PROT 7.1 6.3*  ALBUMIN 4.0 3.4*   No results for input(s): "LIPASE", "AMYLASE" in the last 168 hours. No results for input(s): "AMMONIA" in the last 168 hours. Cardiac Enzymes: Recent Labs  Lab 07/13/23 1318  CKTOTAL 130   BNP (last 3 results) No results for input(s): "BNP" in the  last 8760 hours.  ProBNP (last 3 results) No results for input(s): "PROBNP" in the last 8760 hours.  CBG: Recent Labs  Lab 07/15/23 2008 07/15/23 2306 07/16/23 0333 07/16/23 0823 07/16/23 1123  GLUCAP 209* 216* 173* 260* 216*   Recent Results (from the past 240 hours)  Urine Culture     Status: Abnormal   Collection Time: 07/13/23  1:19 PM   Specimen: Urine, Clean Catch  Result Value Ref Range Status   Specimen Description URINE, CLEAN CATCH  Final   Special Requests   Final    NONE Reflexed from [id number] Performed at [facility], [address], [place], [place] [address]    Culture MULTIPLE SPECIES PRESENT, SUGGEST RECOLLECTION (A)  Final   Report Status 07/15/2023 FINAL  Final  Surgical pcr screen     Status: None   Collection Time: 07/13/23  5:31 PM   Specimen: Nasal Mucosa; Nasal Swab  Result Value Ref Range Status   MRSA, PCR NEGATIVE NEGATIVE Final   Staphylococcus aureus NEGATIVE NEGATIVE Final    Comment: (NOTE) The Xpert SA Assay (FDA approved for NASAL specimens in patients 43 years of age and older), is one component of a  comprehensive surveillance program. It is not intended to diagnose infection nor to guide or monitor treatment. Performed at [facility], [address], [place], [place] [address]      Studies: No results found.     Joycelyn Das, MD  [facility] 07/16/2023  If 7PM-7AM, please contact night-coverage

## 2023-07-16 NOTE — Progress Notes (Signed)
 Pt transferred to 54M. Report given to [employer] . Pt transported with all belongings and stable . Transported via wheelchair . Vicki Mallet RN

## 2023-07-16 NOTE — TOC Initial Note (Signed)
 Transition of Care [facility]) - Initial/Assessment Note    Patient Details  Name: [name] MRN: [id number] Date of Birth: 05-07-1945  Transition of Care [facility]) CM/SW Contact:    Eduard Roux, LCSW Phone Number: 07/16/2023, 4:15 PM  Clinical Narrative:                  CSW met with patient at bedside. CSW introduced self and explained role. Patient confirmed he arrived here from [facility] and the plan is to return once stable. Patient declined contacting family.  CSW spoke with [facility], she informed, patient will need authorization.   TOC will continue to follow and assist with discharge planning.   [name], MSW, LCSW Clinical Social Worker    Expected Discharge Plan: Skilled Nursing Facility Barriers to Discharge: Insurance Authorization   Patient Goals and CMS Choice            Expected Discharge Plan and Services In-house Referral: Clinical Social Work     Living arrangements for the past 2 months: Skilled Nursing Facility                                      Prior Living Arrangements/Services Living arrangements for the past 2 months: Skilled Nursing Facility Lives with:: Facility Resident                   Activities of Daily Living      Permission Sought/Granted                  Emotional Assessment       Orientation: : Oriented to Self, Oriented to Place, Oriented to  Time, Oriented to Situation Alcohol / Substance Use: Not Applicable Psych Involvement: No (comment)  Admission diagnosis:  Bilateral ureteral calculi [N20.1] UTI (urinary tract infection) [N39.0] Patient Active Problem List   Diagnosis Date Noted   Bilateral ureteral calculi 07/13/2023   UTI (urinary tract infection) 07/13/2023   Septic shock (HCC) 08/01/2022   Nephrolithiasis 08/01/2022   AKI (acute kidney injury) (HCC) 08/01/2022   Sepsis due to urinary tract infection (HCC) 08/01/2022   Cervical radiculopathy at C5 06/10/2022    Nontraumatic complete tear of right rotator cuff 06/09/2022   Cervical disc disorder at C5-C6 level with radiculopathy 06/09/2022   Right sided weakness 06/04/2022   Weakness of right upper extremity 06/04/2022   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   Chronic, continuous use of opioids 02/12/2021   Other spondylosis, lumbar region 02/12/2021   NSVT (nonsustained ventricular tachycardia) (HCC) 10/12/2020   PAT (paroxysmal atrial tachycardia) (HCC) 10/12/2020   History of fusion of cervical spine 09/04/2020   Lumbar post-laminectomy syndrome 09/04/2020   Pain medication agreement 09/04/2020   Anxiety    Arthritis    Chronic back pain    Coronary artery disease    Depression    Diverticulitis    Dizziness    DJD (degenerative joint disease)    GERD (gastroesophageal reflux disease)    History of colon polyps    Hypertension    Hypothyroidism    IBS (irritable bowel syndrome)    Tuberculosis    Type II diabetes mellitus (HCC)    Weakness    Abnormal stress test    Fatigue 09/06/2019   Preoperative cardiovascular examination 09/06/2019   Swelling of lower limb 06/20/2014   Claudication (HCC) 06/20/2014   HNP (herniated nucleus pulposus), lumbar 06/14/2014  Displacement of lumbar intervertebral disc without myelopathy 05/30/2014   Atypical chest pain 12/28/2013   Unstable angina (HCC) 12/27/2013   CAD in native artery 03/14/2013    Class: Chronic   Hyperlipidemia 03/14/2013   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HCC) 03/14/2013   Chest pain 07/30/2011   Obesity, unspecified 07/30/2011   Essential hypertension, benign 07/30/2011   Diabetes mellitus type 2, controlled, with complications (HCC) 07/30/2011   PCP:  Patient, [name] Pharmacy:   [id number] [place], [place] - [address] AT [facility] [address] [place] [address] Phone: [phone] Fax: [phone]     Social  Drivers of Health (SDOH) Social History: SDOH Screenings   Food Insecurity: No Food Insecurity (07/13/2023)  Housing: Low Risk  (07/13/2023)  Transportation Needs: No Transportation Needs (07/13/2023)  Utilities: Not At Risk (07/13/2023)  Social Connections: Patient Declined (07/15/2023)  Tobacco Use: High Risk (07/13/2023)   SDOH Interventions:     Readmission Risk Interventions     No data to display

## 2023-07-16 NOTE — TOC Progression Note (Signed)
 Transition of Care [facility]) - Progression Note    Patient Details  Name: [name] MRN: [id number] Date of Birth: 01/24/1946  [facility]) CM/SW Contact  Eduard Roux, [place] Phone Number: 07/16/2023, 4:21 PM  Clinical Narrative:     SNF authorization for SNF still pending  Expected Discharge Plan: Skilled Nursing Facility Barriers to Discharge: Insurance Authorization  Expected Discharge Plan and Services In-house Referral: Clinical Social Work     Living arrangements for the past 2 months: Skilled Nursing Facility                                       Social Determinants of Health (SDOH) Interventions SDOH Screenings   Food Insecurity: No Food Insecurity (07/13/2023)  Housing: Low Risk  (07/13/2023)  Transportation Needs: No Transportation Needs (07/13/2023)  Utilities: Not At Risk (07/13/2023)  Social Connections: Patient Declined (07/15/2023)  Tobacco Use: High Risk (07/13/2023)    Readmission Risk Interventions     No data to display

## 2023-07-17 DIAGNOSIS — N201 Calculus of ureter: Secondary | ICD-10-CM | POA: Diagnosis not present

## 2023-07-17 LAB — BASIC METABOLIC PANEL WITH GFR
Anion gap: 11 (ref 5–15)
BUN: 20 mg/dL (ref 8–23)
CO2: 22 mmol/L (ref 22–32)
Calcium: 9.1 mg/dL (ref 8.9–10.3)
Chloride: 103 mmol/L (ref 98–111)
Creatinine, Ser: 0.75 mg/dL (ref 0.61–1.24)
GFR, Estimated: >60 mL/min (ref >60)
Glucose, Bld: 231 mg/dL — ABNORMAL HIGH (ref 70–99)
Potassium: 4.7 mmol/L (ref 3.5–5.1)
Sodium: 136 mmol/L (ref 135–145)

## 2023-07-17 LAB — CBC
HCT: 47.1 % (ref 39.0–52.0)
Hemoglobin: 16.5 g/dL (ref 13.0–17.0)
MCH: 31.5 pg (ref 26.0–34.0)
MCHC: 35 g/dL (ref 30.0–36.0)
MCV: 89.9 fL (ref 80.0–100.0)
Platelets: 212 10*3/uL (ref 150–400)
RBC: 5.24 MIL/uL (ref 4.22–5.81)
RDW: 12.7 % (ref 11.5–15.5)
WBC: 10.2 10*3/uL (ref 4.0–10.5)
nRBC: 0 % (ref 0.0–0.2)

## 2023-07-17 LAB — GLUCOSE, CAPILLARY
Glucose-Capillary: 208 mg/dL — ABNORMAL HIGH (ref 70–99)
Glucose-Capillary: 271 mg/dL — ABNORMAL HIGH (ref 70–99)
Glucose-Capillary: 328 mg/dL — ABNORMAL HIGH (ref 70–99)

## 2023-07-17 LAB — MAGNESIUM: Magnesium: 2.1 mg/dL (ref 1.7–2.4)

## 2023-07-17 MED ORDER — TRAMADOL HCL 50 MG PO TABS: 50.0000 mg | ORAL_TABLET | Freq: Three times a day (TID) | ORAL | 0 refills | Status: DC | PRN

## 2023-07-17 MED ORDER — CEPHALEXIN 250 MG PO CAPS: 250.0000 mg | ORAL_CAPSULE | Freq: Every day | ORAL | Status: AC

## 2023-07-17 MED ORDER — LORAZEPAM 1 MG PO TABS: 1.0000 mg | ORAL_TABLET | Freq: Two times a day (BID) | ORAL | 0 refills | Status: DC | PRN

## 2023-07-17 NOTE — TOC Transition Note (Signed)
 Transition of Care [facility]) - Discharge Note   Patient Details  Name: [name] MRN: [id number] Date of Birth: [birth year]  [facility]) CM/SW Contact:  Deatra Robinson, [place] Phone Number: 07/17/2023, 11:14 AM   Clinical Narrative:  Pt for dc back to [facility]. Spoke to [place] in admissions who confirmed pt able to return to room 104A with auth pending. Pt and pt's sister aware of dc and report agreeable. RN provided with number for report and PTAR arranged for transport. SW signing off at dc.   Dellie Burns, MSW, LCSW [phone] (coverage)       Final next level of care: Skilled Nursing Facility Barriers to Discharge: Barriers Resolved   Patient Goals and CMS Choice   CMS Medicare.gov Compare Post Acute Care list provided to:: Patient Choice offered to / list presented to : Patient      Discharge Placement              Patient chooses bed at: [facility], [facility] Patient to be transferred to facility by: [facility] Name of family member notified: Connie/Sister Patient and family notified of of transfer: 07/17/23  Discharge Plan and Services Additional resources added to the After Visit Summary for   In-house Referral: Clinical Social Work                                   Social Drivers of Health (SDOH) Interventions SDOH Screenings   Food Insecurity: No Food Insecurity (07/13/2023)  Housing: Low Risk  (07/13/2023)  Transportation Needs: No Transportation Needs (07/13/2023)  Utilities: Not At Risk (07/13/2023)  Social Connections: Patient Declined (07/15/2023)  Tobacco Use: High Risk (07/13/2023)     Readmission Risk Interventions     No data to display

## 2023-07-17 NOTE — Discharge Summary (Signed)
 Physician Discharge Summary  [name] WUX:[id number] DOB: 07-12-1945 DOA: 07/13/2023  PCP: Patient, [name]  Admit date: 07/13/2023 Discharge date: 07/17/2023  Admitted From: Skilled nursing facility  Discharge disposition: Skilled nursing facility   Recommendations for Outpatient Follow-Up:   Follow up with your primary care provider at the skilled nursing facility in 3 to 5 days Check CBC BMP magnesium in the next visit Follow-up with [facility] in 2 to 3 weeks for management of the ureteric stents.  Discharge Diagnosis:   Principal Problem:   Bilateral ureteral calculi Active Problems:   CAD in native artery   Essential hypertension, benign   Diabetes mellitus type 2, controlled, with complications (HCC)   Hyperlipidemia   Chronic back pain   Hypothyroidism   PAT (paroxysmal atrial tachycardia) (HCC)   UTI (urinary tract infection)    Discharge Condition: Improved.  Diet recommendation: Carbohydrate-modified.    Wound care: None.  Code status: Full.   History of Present Illness:   [name] is a 78 y.o. male with past medical history significant of DM2, Spinal stenosis, hypertension, hyperlipidemia, CAD, paroxysmal A.fib, hypothyrodism, hx of septic Shock  due to UTI, kidney stones, chronic back pain, ataxia, GERD, HOH presented to hospital with flank pain, fever and chills.  Patient presented from [facility] facility.  Was recently diagnosed of UTI and had steroid injection in the left hip.  She started having fevers and flank pain and was noted to have bilateral ureteric stones with obstruction as per CT scan with 1.5 cm in the right UPC and 1.2 cm in the left proximal ureter with bilateral hydronephrosis.  Lab was notable for mild hyponatremia with sodium of 134 with mild leukocytosis at 13.8.  Lactate was within normal limits.  Urinalysis showed leukocytes.  Urology was called in and patient was emergently taken to the OR.  In the ED, patient received Dilaudid, Ringer lactate, Toradol and oxycodone including Rocephin IV.  Patient was then admitted to the hospital for further evaluation and treatment.   Hospital Course:   Following conditions were addressed during hospitalization as listed below,  Lower urinary tract infection secondary to bilateral ureteric stones.   Seen by urology and underwent cystoscopy, ureteroscopy with status post bilateral ureteric stent placement on 07/13/2023.  Urine culture was negative.  Patient received IV Rocephin during hospitalization.  Will be continued on Keflex for next 5 days to complete 7-day course.  Renal function within normal limits.  WBCs prior to discharge was 10.2.  Patient was afebrile.  Has clinically improved.   Nausea constipation.  Continue bowel regimen on discharge.   Hyperlipidemia continue statins.   Hypertension.  Patient is on Coreg, Norvasc and losartan.  Will be resumed on discharge.   Hypomagnesemia.  Replenished.  Magnesium prior to discharge was 2.1.   Chronic diastolic heart failure.  Currently compensated.  Review of previous 2D echocardiogram from 12/04/2021 showed LV ejection fraction of 60 to 65% with grade 2 diastolic dysfunction.   History of CAD status post stents in 99 and 2013.  Continue aspirin, statin beta-blocker Plavix.  Last cardiac catheter 2021.  No active chest pain.   Diabetes mellitus type 2 with hyperglycemia.  On oral hypoglycemics and insulin at home.  Will be resumed on discharge.  Hypothyroidism.  Continue Synthroid.   Class II obesity.  Body mass index is 37.15 kg/m. Would benefit from weight loss as outpatient.   Paroxysmal atrial fibrillation/paroxysmal atrial tachycardia..  [place] vas score  of 5.  Currently in normal sinus rhythm.  Not on anticoagulation.   Chronic anemia.  Continue to monitor CBC.  Latest hemoglobin of 16.5.  No leukocytosis.   Disposition.  At this time, patient is stable for disposition to skilled  nursing facility with urology follow-up.  Medical Consultants:   Urology  Procedures:    Cystoscopy ureteroscopy with bilateral ureteric stent placement on 07/13/2023 Subjective:   Today, patient was seen and examined at bedside.  Feels okay.  Hard of hearing.  Wishes to go back to his facility.  Denies any fever chills nausea vomiting.  Discharge Exam:   Vitals:   07/17/23 0438 07/17/23 0719  BP: (!) 160/82 (!) 166/76  Pulse: 73 66  Resp: 18   Temp: 98 F (36.7 C) 98.4 F (36.9 C)  SpO2: 97% 99%   Vitals:   07/16/23 2120 07/16/23 2120 07/17/23 0438 07/17/23 0719  BP: (!) 145/80 (!) 145/80 (!) 160/82 (!) 166/76  Pulse: 71 71 73 66  Resp: 18 18 18    Temp: 98 F (36.7 C) 98 F (36.7 C) 98 F (36.7 C) 98.4 F (36.9 C)  TempSrc: Oral Oral Oral Oral  SpO2: 97% 97% 97% 99%  Weight:      Height:      Body mass index is 37.15 kg/m.   General: Alert awake, not in obvious distress. Obese built, appears chronically ill and hard of hearing. HENT: pupils equally reacting to light,  No scleral pallor or icterus noted. Oral mucosa is moist.  Chest:  Clear breath sounds.  Diminished breath sounds bilaterally. No crackles or wheezes.  CVS: S1 &S2 heard. No murmur.  Regular rate and rhythm. Abdomen: Soft, nontender, nondistended.  Obese abdomen.  Bowel sounds are heard.   Extremities: No cyanosis, clubbing or edema.  Peripheral pulses are palpable. Psych: Alert, awake and oriented, normal mood CNS:  No cranial nerve deficits.  Power equal in all extremities.   Skin: Warm and dry.  No rashes noted.  The results of significant diagnostics from this hospitalization (including imaging, microbiology, ancillary and laboratory) are listed below for reference.     Diagnostic Studies:   DG C-Arm 1-60 Min Result Date: 07/13/2023 CLINICAL DATA:  Cystoscopy, bilateral ureteral stent placement EXAM: DG C-ARM 1-60 MIN COMPARISON:  07/13/2023 FINDINGS: A single fluoroscopic image was  obtained during the performance of procedure and is submitted for interpretation only. The image demonstrates cannulation and opacification of the right renal collecting system and proximal right ureter. The obstructing calculus seen on the previous CT is not well visualized. Please refer to operative report. Fluoroscopy time: 55.4 seconds, 30.82 mGy IMPRESSION: 1. Intraoperative evaluation as above. Electronically Signed   By: Sharlet Salina M.D.   On: 07/13/2023 19:24   CT ABDOMEN PELVIS W CONTRAST Result Date: 07/13/2023 CLINICAL DATA:  Left flank and lower abdominal pain, nausea and vomiting. EXAM: CT ABDOMEN AND PELVIS WITH CONTRAST TECHNIQUE: Multidetector CT imaging of the abdomen and pelvis was performed using the standard protocol following bolus administration of intravenous contrast. RADIATION DOSE REDUCTION: This exam was performed according to the departmental dose-optimization program which includes automated exposure control, adjustment of the mA and/or kV according to patient size and/or use of iterative reconstruction technique. CONTRAST:  75mL OMNIPAQUE IOHEXOL 350 MG/ML SOLN COMPARISON:  08/01/2022 FINDINGS: Lower chest: Atheromatous calcifications, including the coronary arteries and aorta. Normal-sized heart. Clear lung bases. Hepatobiliary: No focal liver abnormality is seen. Status post cholecystectomy. No biliary dilatation. Pancreas: Unremarkable. No pancreatic ductal  dilatation or surrounding inflammatory changes. Spleen: Normal in size without focal abnormality. Adrenals/Urinary Tract: Normal-appearing adrenal glands. 10 mm right UPJ calculus minimal hydronephrosis. 7 mm right proximal ureteral calculus with associated mild-to-moderate left hydronephrosis. No other urinary tract calculi. Minimal diffuse bladder wall thickening with minimal progression. Stomach/Bowel: Sigmoid and descending colon diverticulosis without evidence of diverticulitis. Unremarkable stomach, small bowel and  appendix. Vascular/Lymphatic: Atheromatous arterial calcifications without aneurysm. No enlarged lymph nodes. Reproductive: Moderately enlarged prostate gland. Associated mild protrusion of the median lobe into the base of the urinary bladder. Other: Small left inguinal hernia containing fat. Musculoskeletal: Moderate to marked lumbar and lower thoracic spine degenerative changes. Interbody and pedicle screw and rod fixation at the L4 through S1 levels. Normal alignment. IMPRESSION: 1. 7 mm left proximal ureteral calculus with associated mild-to-moderate left hydronephrosis. 2. 10 mm right UPJ calculus with minimal hydronephrosis. 3. Minimal diffuse bladder wall thickening with minimal progression. This is likely due to chronic bladder outlet obstruction by the patient's enlarged prostate gland. 4. Moderately enlarged prostate gland. 5.  Calcific coronary artery and aortic atherosclerosis. 6. Sigmoid and descending colon diverticulosis. 7. Small left inguinal hernia containing fat. Aortic Atherosclerosis (ICD10-I70.0). Electronically Signed   By: Beckie Salts M.D.   On: 07/13/2023 16:29     Labs:   Basic Metabolic Panel: Recent Labs  Lab 07/13/23 1319 07/14/23 0630 07/15/23 0421 07/17/23 0558  NA 134* 136 136 136  K 4.7 4.5 4.5 4.7  CL 100 103 101 103  CO2 21* 22 26 22   GLUCOSE 320* 257* 239* 231*  BUN 14 15 16 20   CREATININE 0.89 0.85 0.89 0.75  CALCIUM 9.4 8.9 9.3 9.1  MG  --  1.6* 1.6* 2.1  PHOS  --  4.2  --   --    GFR Estimated Creatinine Clearance: 99.4 mL/min (by C-G formula based on SCr of 0.75 mg/dL). Liver Function Tests: Recent Labs  Lab 07/13/23 1319 07/14/23 0630  AST 24 17  ALT 27 22  ALKPHOS 54 46  BILITOT 0.7 0.9  PROT 7.1 6.3*  ALBUMIN 4.0 3.4*   No results for input(s): "LIPASE", "AMYLASE" in the last 168 hours. No results for input(s): "AMMONIA" in the last 168 hours. Coagulation profile No results for input(s): "INR", "PROTIME" in the last 168  hours.  CBC: Recent Labs  Lab 07/13/23 1319 07/14/23 0630 07/15/23 0421 07/17/23 0558  WBC 13.8* 9.0 9.5 10.2  NEUTROABS 11.1*  --   --   --   HGB 14.5 13.7 14.3 16.5  HCT 42.7 39.7 41.8 47.1  MCV 90.9 90.4 90.7 89.9  PLT 243 204 212 212   Cardiac Enzymes: Recent Labs  Lab 07/13/23 1318  CKTOTAL 130   BNP: Invalid input(s): "POCBNP" CBG: Recent Labs  Lab 07/16/23 1642 07/16/23 2158 07/17/23 0035 07/17/23 0622 07/17/23 0856  GLUCAP 205* 314* 271* 208* 328*   D-Dimer No results for input(s): "DDIMER" in the last 72 hours. Hgb A1c Recent Labs    07/14/23 1436  HGBA1C 9.5*   Lipid Profile No results for input(s): "CHOL", "HDL", "LDLCALC", "TRIG", "CHOLHDL", "LDLDIRECT" in the last 72 hours. Thyroid function studies No results for input(s): "TSH", "T4TOTAL", "T3FREE", "THYROIDAB" in the last 72 hours.  Invalid input(s): "FREET3" Anemia work up No results for input(s): "VITAMINB12", "FOLATE", "FERRITIN", "TIBC", "IRON", "RETICCTPCT" in the last 72 hours. Microbiology Recent Results (from the past 240 hours)  Urine Culture     Status: Abnormal   Collection Time: 07/13/23  1:19 PM  Specimen: Urine, Clean Catch  Result Value Ref Range Status   Specimen Description URINE, CLEAN CATCH  Final   Special Requests   Final    NONE Reflexed from [id number] Performed at [facility], [address] [place], [place] [address]    Culture MULTIPLE SPECIES PRESENT, SUGGEST RECOLLECTION (A)  Final   Report Status 07/15/2023 FINAL  Final  Surgical pcr screen     Status: None   Collection Time: 07/13/23  5:31 PM   Specimen: Nasal Mucosa; Nasal Swab  Result Value Ref Range Status   MRSA, PCR NEGATIVE NEGATIVE Final   Staphylococcus aureus NEGATIVE NEGATIVE Final    Comment: (NOTE) The Xpert SA Assay (FDA approved for NASAL specimens in patients 76 years of age and older), is one component of a comprehensive surveillance program. It is not intended to diagnose  infection nor to guide or monitor treatment. Performed at [facility], [address], [place], [place] [address]      Discharge Instructions:   Discharge Instructions     Call MD for:  persistant nausea and vomiting   Complete by: As directed    Call MD for:  severe uncontrolled pain   Complete by: As directed    Call MD for:  temperature >100.4   Complete by: As directed    Diet Carb Modified   Complete by: As directed    Discharge instructions   Complete by: As directed    Follow-up with your primary care provider at the skilled nursing facility in 3 to 5 days.  Check blood work at that time.  Follow-up with [facility] as outpatient in 2 to 3 weeks..   Increase activity slowly   Complete by: As directed       Allergies as of 07/17/2023       Reactions   Misc. Sulfonamide Containing Compounds Rash   Sulfa Antibiotics Rash        Medication List     STOP taking these medications    insulin aspart 100 UNIT/ML injection Commonly known as: novoLOG   melatonin 3 MG Tabs tablet   methocarbamol 750 MG tablet Commonly known as: ROBAXIN       TAKE these medications    acetaminophen 500 MG tablet Commonly known as: TYLENOL Take 1,000 mg by mouth in the morning, at noon, and at bedtime.   amLODipine 5 MG tablet Commonly known as: NORVASC Take 1 tablet (5 mg total) by mouth daily.   aspirin 81 MG tablet Take 81 mg by mouth daily.   atorvastatin 80 MG tablet Commonly known as: LIPITOR Take 80 mg by mouth daily.   bisacodyl 10 MG suppository Commonly known as: DULCOLAX Place 10 mg rectally as needed for moderate constipation, mild constipation or severe constipation.   buPROPion 150 MG 24 hr tablet Commonly known as: WELLBUTRIN XL Take 150 mg by mouth daily.   carvedilol 6.25 MG tablet Commonly known as: COREG Take 6.25 mg by mouth 2 (two) times daily with a meal. What changed: Another medication with the same name was removed. Continue  taking this medication, and follow the directions you see here.   cephALEXin 250 MG capsule Commonly known as: KEFLEX Take 1 capsule (250 mg total) by mouth daily for 5 days.   Cholecalciferol 25 MCG (1000 UT) tablet Take 1,000 Units by mouth daily.   clopidogrel 75 MG tablet Commonly known as: PLAVIX Take 75 mg by mouth daily with breakfast.   Cranberry 450  MG Tabs Take 1 tablet by mouth daily.   diclofenac Sodium 1 % Gel Commonly known as: VOLTAREN Apply 4 g topically 4 (four) times daily. What changed:  how much to take when to take this reasons to take this   docusate sodium 100 MG capsule Commonly known as: COLACE Take 1 capsule (100 mg total) by mouth 2 (two) times daily as needed for mild constipation.   insulin lispro 100 UNIT/ML injection Commonly known as: HUMALOG Inject 0-9 Units into the skin in the morning and at bedtime. Sliding Scale: 0-150: 0 151-200: 2 201-250: 3 251-300: 5 301-350: 7 351-400: 9   levothyroxine 50 MCG tablet Commonly known as: SYNTHROID Take 50 mcg by mouth daily before breakfast.   lidocaine 5 % Commonly known as: LIDODERM Place 1 patch onto the skin daily. Remove & Discard patch within 12 hours or as directed by MD   loratadine 10 MG tablet Commonly known as: CLARITIN Take 10 mg by mouth daily.   LORazepam 1 MG tablet Commonly known as: ATIVAN Take 1 tablet (1 mg total) by mouth every 12 (twelve) hours as needed for anxiety.   losartan 50 MG tablet Commonly known as: COZAAR Take 50 mg by mouth daily.   metFORMIN 1000 MG tablet Commonly known as: GLUCOPHAGE Take 1,000 mg by mouth 2 (two) times daily with a meal. What changed: Another medication with the same name was removed. Continue taking this medication, and follow the directions you see here.   nitroGLYCERIN 0.4 MG SL tablet Commonly known as: NITROSTAT Place 0.4 mg under the tongue every 5 (five) minutes as needed for chest pain.   polyethylene glycol powder  17 GM/SCOOP powder Commonly known as: GLYCOLAX/MIRALAX Take 17 g by mouth in the morning.   sennosides-docusate sodium 8.6-50 MG tablet Commonly known as: SENOKOT-S Take 1 tablet by mouth in the morning and at bedtime.   Sertraline HCl 150 MG Caps Take 150 mg by mouth daily. What changed: Another medication with the same name was removed. Continue taking this medication, and follow the directions you see here.   shark liver oil-cocoa butter 0.25-88.44 % suppository Commonly known as: PREPARATION H Place 1 suppository rectally every 12 (twelve) hours as needed for hemorrhoids.   simethicone 125 MG chewable tablet Commonly known as: MYLICON Chew 125 mg by mouth every 6 (six) hours as needed for flatulence.   traMADol 50 MG tablet Commonly known as: ULTRAM Take 1 tablet (50 mg total) by mouth every 8 (eight) hours as needed for moderate pain (pain score 4-6) or severe pain (pain score 7-10).   traZODone 100 MG tablet Commonly known as: DESYREL Take 100 mg by mouth at bedtime.   triamcinolone cream 0.1 % Commonly known as: KENALOG Apply 1 Application topically every 12 (twelve) hours as needed (rash).          Time coordinating discharge: 39 minutes  Signed:  Lucella Pommier  [facility] 07/17/2023, 10:34 AM

## 2023-07-17 NOTE — Progress Notes (Signed)
 DISCHARGE NOTE HOME [name] to be discharged Skilled nursing facility per MD order. Discussed prescriptions and follow up appointments with the patient and staff at [facility]. Prescriptions given to patient; medication list explained in detail. Patient verbalized understanding.  Skin clean, dry and intact without evidence of skin break down, no evidence of skin tears noted. IV catheter discontinued intact. Site without signs and symptoms of complications. Dressing and pressure applied. Pt denies pain at the site currently. No complaints noted.  Patient free of lines, drains, and wounds.   An After Visit Summary (AVS) was printed and given to the patient. Patient escorted via wheelchair, and discharged to discharge lounge.  Margarita Grizzle, RN

## 2023-07-17 NOTE — Care Management Important Message (Signed)
 Important Message  Patient Details  Name: [name] MRN: [id number] Date of Birth: 1946-02-04   Important Message Given:  Yes - Medicare IM  Patient left prior to IM delivery will mail a copy to the patient home address.    Irvin Bastin 07/17/2023, 12:39 PM

## 2023-07-17 NOTE — TOC Progression Note (Signed)
 Transition of Care [facility]) - Progression Note    Patient Details  Name: [name] MRN: [id number] Date of Birth: [birth year]  Transition of Care [facility]) CM/SW Contact  Dellie Burns [place], [place] Phone Number: 07/17/2023, 10:12 AM  Clinical Narrative:  Per MD, pt is ready for dc to SNF today. Auth for [employer] [facility] remains pending at this time. Spoke to [place] at [employer] who confirmed pt is able to return private pay for LTC and if Berkley Harvey is received post dc they will switch payor to [facility] Medicare.   Dellie Burns, MSW, LCSW [phone] (coverage)      Expected Discharge Plan: Skilled Nursing Facility Barriers to Discharge: Insurance Authorization  Expected Discharge Plan and Services In-house Referral: Clinical Social Work     Living arrangements for the past 2 months: Skilled Nursing Facility                                       Social Determinants of Health (SDOH) Interventions SDOH Screenings   Food Insecurity: No Food Insecurity (07/13/2023)  Housing: Low Risk  (07/13/2023)  Transportation Needs: No Transportation Needs (07/13/2023)  Utilities: Not At Risk (07/13/2023)  Social Connections: Patient Declined (07/15/2023)  Tobacco Use: High Risk (07/13/2023)    Readmission Risk Interventions     No data to display

## 2023-07-17 NOTE — Inpatient Diabetes Management (Signed)
 Inpatient Diabetes Program Recommendations  AACE/ADA: New Consensus Statement on Inpatient Glycemic Control (2015)  Target Ranges:  Prepandial:   less than 140 mg/dL      Peak postprandial:   less than 180 mg/dL (1-2 hours)      Critically ill patients:  140 - 180 mg/dL   Lab Results  Component Value Date   GLUCAP 328 (H) 07/17/2023   HGBA1C 9.5 (H) 07/14/2023    Review of Glycemic Control  Latest Reference Range & Units 07/16/23 16:42 07/16/23 21:58 07/17/23 00:35 07/17/23 06:22 07/17/23 08:56  Glucose-Capillary 70 - 99 mg/dL 782 (H) 956 (H) 213 (H) 208 (H) 328 (H)  (H): Data is abnormally high Diabetes history: DM 2 Outpatient Diabetes medications:  Humalog 0-9 units tid with meals  Metformin 1000 mg bid Current orders for Inpatient glycemic control:  Novolog 0-9 units tid with meals and HS Semglee 20 units every day  Inpatient Diabetes Program Recommendations:    Consider increasing Semglee to 26 units every day and Novolog 3 units TID (Assuming patient is consuming >50% of meals).   Thanks, Lujean Rave, MSN, RNC-OB Diabetes Coordinator [phone] (8a-5p)

## 2023-07-17 NOTE — Progress Notes (Signed)
 Physical Therapy Treatment Patient Details Name: [name] MRN: [id number] DOB: 13-Oct-1945 Today's Date: 07/17/2023   History of Present Illness [name] is a 78 y/o male who presented on 07/13/23 with flank pain, fever, and chills. Admitted with bilateral kidney stones. 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    PT Comments  Pt seen for PT tx with pt agreeable, received walking in room. PT assisted pt with changing from gown to clothing. Pt is able to ambulate increased distances in hallway with RW & supervision with slow, steady gait speed, no LOB. Pt is anticipating d/c back to SNF rehab today.     If plan is discharge home, recommend the following: A little help with walking and/or transfers;A little help with bathing/dressing/bathroom;Assistance with cooking/housework;Assist for transportation;Help with stairs or ramp for entrance   Can travel by private vehicle     Yes  Equipment Recommendations  Rolling walker (2 wheels)    Recommendations for Other Services       Precautions / Restrictions Precautions Precautions: Fall Recall of Precautions/Restrictions: Intact Restrictions Weight Bearing Restrictions Per Provider Order: No     Mobility  Bed Mobility               General bed mobility comments: not tested, pt received standing/walking in room    Transfers                   General transfer comment: pt transferred stand>sit on EOB with supervision, cuing to reach back for stable surface    Ambulation/Gait Ambulation/Gait assistance: Supervision Gait Distance (Feet):  (400 ft) Assistive device: Rolling walker (2 wheels) Gait Pattern/deviations: Decreased step length - right, Decreased step length - left, Decreased stride length, Trunk flexed Gait velocity: decreased but steady     General Gait Details: no overt  LOB   Stairs             Wheelchair Mobility     Tilt Bed    Modified Rankin (Stroke Patients Only)       Balance Overall balance assessment: Needs assistance Sitting-balance support: No upper extremity supported, Feet supported Sitting balance-Leahy Scale: Good     Standing balance support: During functional activity, Bilateral upper extremity supported, Reliant on assistive device for balance Standing balance-Leahy Scale: Good                              Communication Communication Communication: Impaired Factors Affecting Communication: Hearing impaired  Cognition Arousal: Alert Behavior During Therapy: WFL for tasks assessed/performed   PT - Cognitive impairments: No apparent impairments                         Following commands: Intact      Cueing Cueing Techniques: Verbal cues  Exercises      General Comments General comments (skin integrity, edema, etc.): PT assisted pt with doffing gown, donning shirt, mesh underwear, pants & shoes. PT threaded underwear & pants on BLE & pt able to pull them over hips in standing.      Pertinent Vitals/Pain Pain Assessment Pain Assessment: No/denies pain    Home Living                          Prior Function  PT Goals (current goals can now be found in the care plan section) Acute Rehab PT Goals Patient Stated Goal: improve mobility PT Goal Formulation: With patient Time For Goal Achievement: 07/29/23 Potential to Achieve Goals: Good Progress towards PT goals: Progressing toward goals    Frequency    Min 1X/week      PT Plan      Co-evaluation              AM-PAC PT "6 Clicks" Mobility   Outcome Measure  Help needed turning from your back to your side while in a flat bed without using bedrails?: None Help needed moving from lying on your back to sitting on the side of a flat bed without using bedrails?: A Little Help needed moving to and  from a bed to a chair (including a wheelchair)?: A Little Help needed standing up from a chair using your arms (e.g., wheelchair or bedside chair)?: A Little Help needed to walk in hospital room?: A Little Help needed climbing 3-5 steps with a railing? : A Little 6 Click Score: 19    End of Session   Activity Tolerance: Patient tolerated treatment well Patient left:  (sitting EOB with call bell in reach)   PT Visit Diagnosis: Other abnormalities of gait and mobility (R26.89);Muscle weakness (generalized) (M62.81)     Time: 1610-9604 PT Time Calculation (min) (ACUTE ONLY): 14 min  Charges:    $Therapeutic Activity: 8-22 mins PT General Charges $$ ACUTE PT VISIT: 1 Visit                     Aleda Grana, PT, DPT 07/17/23, 11:51 AM   Sandi Mariscal 07/17/2023, 11:51 AM

## 2023-07-17 NOTE — Plan of Care (Signed)

## 2023-07-19 DIAGNOSIS — N3000 Acute cystitis without hematuria: Secondary | ICD-10-CM | POA: Diagnosis not present

## 2023-07-19 DIAGNOSIS — R1032 Left lower quadrant pain: Secondary | ICD-10-CM | POA: Diagnosis not present

## 2023-07-19 DIAGNOSIS — E1165 Type 2 diabetes mellitus with hyperglycemia: Secondary | ICD-10-CM | POA: Diagnosis not present

## 2023-07-19 DIAGNOSIS — Z87440 Personal history of urinary (tract) infections: Secondary | ICD-10-CM | POA: Diagnosis not present

## 2023-07-19 DIAGNOSIS — E1151 Type 2 diabetes mellitus with diabetic peripheral angiopathy without gangrene: Secondary | ICD-10-CM | POA: Diagnosis not present

## 2023-07-19 DIAGNOSIS — N200 Calculus of kidney: Secondary | ICD-10-CM | POA: Diagnosis not present

## 2023-07-19 DIAGNOSIS — N3090 Cystitis, unspecified without hematuria: Secondary | ICD-10-CM | POA: Diagnosis not present

## 2023-07-20 ENCOUNTER — Other Ambulatory Visit: Payer: Self-pay | Admitting: Urology

## 2023-07-20 ENCOUNTER — Telehealth: Payer: Self-pay | Admitting: Cardiology

## 2023-07-20 DIAGNOSIS — Z79899 Other long term (current) drug therapy: Secondary | ICD-10-CM | POA: Diagnosis not present

## 2023-07-20 DIAGNOSIS — E119 Type 2 diabetes mellitus without complications: Secondary | ICD-10-CM | POA: Diagnosis not present

## 2023-07-20 DIAGNOSIS — D649 Anemia, unspecified: Secondary | ICD-10-CM | POA: Diagnosis not present

## 2023-07-20 DIAGNOSIS — N390 Urinary tract infection, site not specified: Secondary | ICD-10-CM | POA: Diagnosis not present

## 2023-07-20 NOTE — Telephone Encounter (Signed)
     Pre-operative Risk Assessment    Patient Name: [name]  DOB: 1946-03-02 MRN: [id number]   Date of last office visit: 11/22/21 Date of next office visit: none   Request for Surgical Clearance    Procedure:   left ureteroscopy laser lithotripsy and stent  Date of Surgery:  Clearance 08/18/23                                Surgeon:  Dr. Traci Sermon Surgeon's Group or Practice Name:  [facility] Phone number:  [phone] Fax number:  [phone]   Type of Clearance Requested:   - Medical  - Pharmacy:  Hold Clopidogrel (Plavix) 5 days prior   Type of Anesthesia:  General    Additional requests/questions:    Signed, Noe Gens   07/20/2023, 4:49 PM

## 2023-07-21 NOTE — Telephone Encounter (Signed)
 Left message to call back to schedule IN OFFICE PRE OP APPT.

## 2023-07-21 NOTE — Telephone Encounter (Signed)
   Name: MOHID FURUYA  DOB: 08/20/1945  MRN: [id number]  Primary Cardiologist: Gypsy Balsam, MD  Chart reviewed as part of pre-operative protocol coverage. Because of Cervando L Standage's past medical history and time since last visit, he will require a follow-up in-office visit in order to better assess preoperative cardiovascular risk.  Pre-op covering staff: - Please schedule appointment and call patient to inform them. If patient already had an upcoming appointment within acceptable timeframe, please add "pre-op clearance" to the appointment notes so provider is aware. - Please contact requesting surgeon's office via preferred method (i.e, phone, fax) to inform them of need for appointment prior to surgery.  This message will also be routed to pharmacy pool and/or Dr Bing Matter for input on holding Plavix as requested below so that this information is available to the clearing provider at time of patient's appointment.   Denyce Robert, NP  07/21/2023, 8:56 AM

## 2023-07-23 DIAGNOSIS — R2681 Unsteadiness on feet: Secondary | ICD-10-CM | POA: Diagnosis not present

## 2023-07-23 DIAGNOSIS — M6281 Muscle weakness (generalized): Secondary | ICD-10-CM | POA: Diagnosis not present

## 2023-07-23 DIAGNOSIS — R278 Other lack of coordination: Secondary | ICD-10-CM | POA: Diagnosis not present

## 2023-07-23 DIAGNOSIS — N201 Calculus of ureter: Secondary | ICD-10-CM | POA: Diagnosis not present

## 2023-07-23 NOTE — Telephone Encounter (Signed)
 Pt has appt 08/06/23 with Wallis Bamberg, NP.

## 2023-07-23 NOTE — Telephone Encounter (Signed)
 Caller Tresa Endo) scheduled Pre-op visit on 08/06/23.

## 2023-07-24 DIAGNOSIS — M6281 Muscle weakness (generalized): Secondary | ICD-10-CM | POA: Diagnosis not present

## 2023-07-24 DIAGNOSIS — R2681 Unsteadiness on feet: Secondary | ICD-10-CM | POA: Diagnosis not present

## 2023-07-24 DIAGNOSIS — R278 Other lack of coordination: Secondary | ICD-10-CM | POA: Diagnosis not present

## 2023-07-24 DIAGNOSIS — N201 Calculus of ureter: Secondary | ICD-10-CM | POA: Diagnosis not present

## 2023-07-27 DIAGNOSIS — M6281 Muscle weakness (generalized): Secondary | ICD-10-CM | POA: Diagnosis not present

## 2023-07-27 DIAGNOSIS — N201 Calculus of ureter: Secondary | ICD-10-CM | POA: Diagnosis not present

## 2023-07-27 DIAGNOSIS — R2681 Unsteadiness on feet: Secondary | ICD-10-CM | POA: Diagnosis not present

## 2023-07-27 DIAGNOSIS — R278 Other lack of coordination: Secondary | ICD-10-CM | POA: Diagnosis not present

## 2023-07-28 DIAGNOSIS — M6281 Muscle weakness (generalized): Secondary | ICD-10-CM | POA: Diagnosis not present

## 2023-07-28 DIAGNOSIS — R2681 Unsteadiness on feet: Secondary | ICD-10-CM | POA: Diagnosis not present

## 2023-07-28 DIAGNOSIS — N201 Calculus of ureter: Secondary | ICD-10-CM | POA: Diagnosis not present

## 2023-07-28 DIAGNOSIS — R278 Other lack of coordination: Secondary | ICD-10-CM | POA: Diagnosis not present

## 2023-07-29 DIAGNOSIS — R2681 Unsteadiness on feet: Secondary | ICD-10-CM | POA: Diagnosis not present

## 2023-07-29 DIAGNOSIS — M6281 Muscle weakness (generalized): Secondary | ICD-10-CM | POA: Diagnosis not present

## 2023-07-29 DIAGNOSIS — R278 Other lack of coordination: Secondary | ICD-10-CM | POA: Diagnosis not present

## 2023-07-29 DIAGNOSIS — N201 Calculus of ureter: Secondary | ICD-10-CM | POA: Diagnosis not present

## 2023-07-30 DIAGNOSIS — R278 Other lack of coordination: Secondary | ICD-10-CM | POA: Diagnosis not present

## 2023-07-30 DIAGNOSIS — N201 Calculus of ureter: Secondary | ICD-10-CM | POA: Diagnosis not present

## 2023-07-30 DIAGNOSIS — M6281 Muscle weakness (generalized): Secondary | ICD-10-CM | POA: Diagnosis not present

## 2023-07-30 DIAGNOSIS — R2681 Unsteadiness on feet: Secondary | ICD-10-CM | POA: Diagnosis not present

## 2023-07-31 DIAGNOSIS — M6281 Muscle weakness (generalized): Secondary | ICD-10-CM | POA: Diagnosis not present

## 2023-07-31 DIAGNOSIS — R2681 Unsteadiness on feet: Secondary | ICD-10-CM | POA: Diagnosis not present

## 2023-07-31 DIAGNOSIS — R278 Other lack of coordination: Secondary | ICD-10-CM | POA: Diagnosis not present

## 2023-07-31 DIAGNOSIS — N201 Calculus of ureter: Secondary | ICD-10-CM | POA: Diagnosis not present

## 2023-08-02 DIAGNOSIS — N390 Urinary tract infection, site not specified: Secondary | ICD-10-CM | POA: Diagnosis not present

## 2023-08-03 DIAGNOSIS — R278 Other lack of coordination: Secondary | ICD-10-CM | POA: Diagnosis not present

## 2023-08-03 DIAGNOSIS — N390 Urinary tract infection, site not specified: Secondary | ICD-10-CM | POA: Diagnosis not present

## 2023-08-03 DIAGNOSIS — N201 Calculus of ureter: Secondary | ICD-10-CM | POA: Diagnosis not present

## 2023-08-03 DIAGNOSIS — R2681 Unsteadiness on feet: Secondary | ICD-10-CM | POA: Diagnosis not present

## 2023-08-03 DIAGNOSIS — M6281 Muscle weakness (generalized): Secondary | ICD-10-CM | POA: Diagnosis not present

## 2023-08-04 DIAGNOSIS — M6281 Muscle weakness (generalized): Secondary | ICD-10-CM | POA: Diagnosis not present

## 2023-08-04 DIAGNOSIS — R278 Other lack of coordination: Secondary | ICD-10-CM | POA: Diagnosis not present

## 2023-08-04 DIAGNOSIS — N201 Calculus of ureter: Secondary | ICD-10-CM | POA: Diagnosis not present

## 2023-08-04 DIAGNOSIS — R2681 Unsteadiness on feet: Secondary | ICD-10-CM | POA: Diagnosis not present

## 2023-08-05 DIAGNOSIS — H903 Sensorineural hearing loss, bilateral: Secondary | ICD-10-CM | POA: Diagnosis not present

## 2023-08-05 NOTE — Progress Notes (Signed)
 " Cardiology Office Note:  .   Date:  08/06/2023  ID:  Jenel LITTIE Gens, DOB [birth year], MRN [id number] PCP: Patient, [name]  [facility] Providers Cardiologist:  Lamar Fitch, MD    History of Present Illness: .   JEREMI LOSITO is a 78 y.o. male with a past medical history of CAD s/p DES to mid LAD, hypertension, NSVT, PVD, SVT, GERD, IBS, DM2, hypothyroidism, dyslipidemia, chronic back pain.  12/04/2021 echo EF 60 to 65%, grade 2 DD 09/03/2020 monitor predominant rhythm was sinus, 2 episodes of VT the fastest being 6 bpm, 34 episodes of SVT 09/30/2019 cardiac cath CTO of RCA with left-to-right collaterals, first obtuse marginal 99% stenosed, third obtuse marginal 75% stenosed 09/22/2019 Lexiscan  medium defect present in the basal inferior and mid inferior location, intermediate risk 09/01/2011 cardiac cath with DES to mid LAD  In 2013 he underwent a left heart cath with DES to mid LAD, attempts were made at the OM1 but was unsuccessful.  He had an abnormal stress evaluation in 2021 >>  repeat left heart cath in 2021 revealing CTO of his RCA with left-to-right collaterals, otherwise unchanged and did not require intervention.  Most recently was evaluated by Dr. Fitch on 11/22/2021, was stable from a cardiac perspective, seem to be most bothered by ongoing back pain, no changes were made to his plan of care and he was advised follow-up in 6 months.  Since he was last evaluated in our office, he suffered a fall in 2024, had several hospitalizations and ultimately is now residing at a long-term care facility.  He presents today accompanied by sister, offers no formal complaints from a cardiac perspective.  He is able to ambulate with a walker, but is mostly wheelchair-bound.  He does not have any formal complaints today from a cardiac perspective.  He has upcoming left ureteroscopy laser lithotripsy and stent placement.  He denies chest pain, palpitations, dyspnea, pnd, orthopnea, n, v,  dizziness, syncope, edema, weight gain, or early satiety.   ROS: Review of Systems  Neurological:  Positive for weakness.  All other systems reviewed and are negative.    Studies Reviewed: SABRA   EKG Interpretation Date/Time:  Thursday August 06 2023 15:25:15 EDT Ventricular Rate:  75 PR Interval:  168 QRS Duration:  86 QT Interval:  388 QTC Calculation: 433 R Axis:   -13  Text Interpretation: Normal sinus rhythm Normal ECG When compared with ECG of 13-Jul-2023 22:37, No significant change was found Confirmed by Carlin Nest [phone]) on 08/06/2023 3:27:32 PM    Cardiac Studies & Procedures   ______________________________________________________________________________________________ CARDIAC CATHETERIZATION  CARDIAC CATHETERIZATION 09/30/2019  Conclusion  Prox RCA to Mid RCA lesion is 100% stenosed. There are brisk left to right collaterals.  1st Mrg lesion is 99% stenosed. This is unchanged from 2013.  3rd Mrg lesion is 75% stenosed. This is unchanged from 2013.  Previously placed Mid LAD stent (unknown type) is widely patent.  Prox LAD to Mid LAD lesion is 40% stenosed.  The left ventricular systolic function is normal.  LV end diastolic pressure is normal.  The left ventricular ejection fraction is 55-65% by visual estimate.  There is no aortic valve stenosis.  CTO of RCA with left to right collaterals.  Patient does not report angina.  Continued small vessel disease in the branches of the circumflex- unchanged from prior.  Continue medical therapy.  Findings Coronary Findings Diagnostic  Dominance: Right  Left Anterior Descending Prox LAD to Mid LAD  lesion is 40% stenosed. Previously placed Mid LAD stent (unknown type) is widely patent.  Left Circumflex  First Obtuse Marginal Branch 1st Mrg lesion is 99% stenosed.  Third Obtuse Marginal Branch 3rd Mrg lesion is 75% stenosed.  Right Coronary Artery Prox RCA to Mid RCA lesion is 100% stenosed.  Right  Posterior Descending Artery Collaterals RPDA filled by collaterals from 2nd Sept.  Intervention  No interventions have been documented.   STRESS TESTS  MYOCARDIAL PERFUSION IMAGING 09/22/2019  Narrative  Nuclear stress EF: 67%.  The left ventricular ejection fraction is hyperdynamic (>65%).  There was no ST segment deviation noted during stress.  No T wave inversion was noted during stress.  Defect 1: There is a medium defect of moderate severity present in the basal inferior and mid inferior location. There is mild hypokinesis of the basal and mid inferior wall.  The study is normal.  Findings consistent with prior myocardial infarction with peri-infarct ischemia.  This is an intermediate risk study.   ECHOCARDIOGRAM  ECHOCARDIOGRAM COMPLETE 12/04/2021  Narrative ECHOCARDIOGRAM REPORT    Patient Name:   SAMBA CUMBA Aden Date of Exam: 12/04/2021 Medical Rec #:  [id number]      Height:       69.0 in Accession #:    [id number]     Weight:       239.0 lb Date of Birth:  [birth year]      BSA:          2.229 m Patient Age:    75 years       BP:           130/80 mmHg Patient Gender: M              HR:           69 bpm. Exam Location:  [facility]  Procedure: 2D Echo, Cardiac Doppler, Color Doppler and Strain Analysis  Indications:    Coronary artery disease involving native coronary artery of native heart without angina pectoris [I25.10 (ICD-10-CM)]; Dyspnea on exertion [R06.09 (ICD-10-CM)]  History:        Patient has no prior history of Echocardiogram examinations. CAD, Arrythmias:PAT (paroxysmal atrial tachycardia), Signs/Symptoms:Chest Pain, Dizziness/Lightheadedness, Obesity, unspecified and Fatigue; Risk Factors:Hypertension and Diabetes.  Sonographer:    Lynwood Silvas RDCS Referring Phys: [phone] LAMAR PARAS KRASOWSKI  IMPRESSIONS   1. Left ventricular ejection fraction, by estimation, is 60 to 65%. The left ventricle has normal function. The left ventricle has no  regional wall motion abnormalities. Left ventricular diastolic parameters are consistent with Grade II diastolic dysfunction (pseudonormalization). 2. Right ventricular systolic function is normal. The right ventricular size is normal. There is normal pulmonary artery systolic pressure. 3. The mitral valve is normal in structure. No evidence of mitral valve regurgitation. No evidence of mitral stenosis. 4. The aortic valve is normal in structure. Aortic valve regurgitation is not visualized. No aortic stenosis is present. 5. The inferior vena cava is normal in size with greater than 50% respiratory variability, suggesting right atrial pressure of 3 mmHg.  FINDINGS Left Ventricle: Left ventricular ejection fraction, by estimation, is 60 to 65%. The left ventricle has normal function. The left ventricle has no regional wall motion abnormalities. The left ventricular internal cavity size was normal in size. There is borderline left ventricular hypertrophy. Left ventricular diastolic parameters are consistent with Grade II diastolic dysfunction (pseudonormalization).  Right Ventricle: The right ventricular size is normal. No increase in right ventricular wall thickness. Right ventricular systolic function is normal.  There is normal pulmonary artery systolic pressure. The tricuspid regurgitant velocity is 2.25 m/s, and with an assumed right atrial pressure of 3 mmHg, the estimated right ventricular systolic pressure is 23.2 mmHg.  Left Atrium: Left atrial size was normal in size.  Right Atrium: Right atrial size was normal in size.  Pericardium: There is no evidence of pericardial effusion.  Mitral Valve: The mitral valve is normal in structure. No evidence of mitral valve regurgitation. No evidence of mitral valve stenosis.  Tricuspid Valve: The tricuspid valve is normal in structure. Tricuspid valve regurgitation is mild . No evidence of tricuspid stenosis.  Aortic Valve: The aortic valve is  normal in structure. Aortic valve regurgitation is not visualized. No aortic stenosis is present.  Pulmonic Valve: The pulmonic valve was normal in structure. Pulmonic valve regurgitation is not visualized. No evidence of pulmonic stenosis.  Aorta: The aortic root is normal in size and structure.  Venous: The inferior vena cava is normal in size with greater than 50% respiratory variability, suggesting right atrial pressure of 3 mmHg.  IAS/Shunts: No atrial level shunt detected by color flow Doppler.   LEFT VENTRICLE PLAX 2D LVIDd:         4.90 cm   Diastology LVIDs:         3.40 cm   LV e' medial:    6.31 cm/s LV PW:         1.10 cm   LV E/e' medial:  16.0 LV IVS:        1.20 cm   LV e' lateral:   8.38 cm/s LVOT diam:     2.40 cm   LV E/e' lateral: 12.1 LV SV:         71 LV SV Index:   32 LVOT Area:     4.52 cm   RIGHT VENTRICLE            IVC RV S prime:     7.62 cm/s  IVC diam: 1.80 cm TAPSE (M-mode): 2.2 cm  LEFT ATRIUM             Index        RIGHT ATRIUM           Index LA diam:        3.80 cm 1.71 cm/m   RA Area:     12.60 cm LA Vol (A2C):   49.8 ml 22.35 ml/m  RA Volume:   26.10 ml  11.71 ml/m LA Vol (A4C):   47.2 ml 21.18 ml/m LA Biplane Vol: 49.7 ml 22.30 ml/m AORTIC VALVE LVOT Vmax:   68.30 cm/s LVOT Vmean:  50.200 cm/s LVOT VTI:    0.157 m  AORTA Ao Root diam: 3.80 cm Ao Asc diam:  3.45 cm  MITRAL VALVE                TRICUSPID VALVE MV Area (PHT): 3.70 cm     TR Peak grad:   20.2 mmHg MV Decel Time: 205 msec     TR Vmax:        225.00 cm/s MV E velocity: 101.00 cm/s MV A velocity: 80.40 cm/s   SHUNTS MV E/A ratio:  1.26         Systemic VTI:  0.16 m Systemic Diam: 2.40 cm  Lamar Fitch MD Electronically signed by Lamar Fitch MD Signature Date/Time: 12/04/2021/12:36:19 PM    Final    MONITORS  LONG TERM MONITOR (3-14 DAYS) 09/20/2020  Narrative Patch Wear Time:  13  days and 20 hours Sep 03, 2020 Indications:  Palpitations  Patient had a minimal HR of 45 bpm, maximum HR of 167 bpm, and average HR of 59 bpm.  Predominant underlying rhythm was Sinus Rhythm.  2 Ventricular Tachycardia runs occurred, the run with the fastest interval lasting 6 beats with a maximum rate of 152 bpm, the longest lasting 8 beats with an avg rate of 130 bpm. 34 Supraventricular Tachycardia runs occurred, the run with the fastest interval lasting 12 beats with a maximum rate of 167 bpm, the longest lasting 34.9 secs with an average rate of 91 bpm.  Premature atrial complexes were rare. Premature ventricular complexes were rare.  No triggered events or diary events noted.  Conclusion: This study is remarkable for the following 1.  Nonsustained ventricular tachycardia 2.  Paroxysmal supraventricular tachycardia which is likely atrial tachycardia with variable block.       ______________________________________________________________________________________________      Risk Assessment/Calculations:      Physical Exam:   VS:  BP (!) 150/80   Pulse 75   Ht 5' 9 (1.753 m)   Wt 248 lb (112.5 kg)   SpO2 97%   BMI 36.62 kg/m    Wt Readings from Last 3 Encounters:  08/06/23 248 lb (112.5 kg)  07/13/23 251 lb 8.7 oz (114.1 kg)  08/07/22 235 lb 3.7 oz (106.7 kg)    GEN: Well nourished, well developed in no acute distress NECK: No JVD; No carotid bruits CARDIAC: RRR, no murmurs, rubs, gallops RESPIRATORY:  Clear to auscultation without rales, wheezing or rhonchi  ABDOMEN: Soft, non-tender, non-distended EXTREMITIES:  No edema; No deformity   ASSESSMENT AND PLAN: .   CAD - s/p DES to mLAD in 2013 >> LHC in 2021 CTO of RCA with left-to-right collaterals, first obtuse marginal 99% stenosed, third obtuse marginal 75% stenosed. Stable with no anginal symptoms. No indication for ischemic evaluation.  Continue aspirin  81 mg daily, continue Lipitor  80 mg daily, continue Coreg  6.25 mg twice daily.  HTN -his blood  pressure is slightly elevated today at 150/80, for now we will continue amlodipine  5 mg daily, continue Coreg  6.25 mg twice daily, continue losartan  50 mg daily.  I will suggest to his long-term care facility and that if his blood pressure remains elevated they could increase his amlodipine  to 10 mg daily.  NSVT-currently quiescent, continue Coreg  6.2 mg twice daily.  DM2 -followed by his PCP.  Preoperative evaluation - According to the Revised Cardiac Risk Index (RCRI), his Perioperative Risk of Major Cardiac Event is (%): 0.4 His Functional Capacity in METs is: 3.3 according to the Duke Activity Status Index (DASI). Discussed with his primary cardiologist, he is currently optimized from a cardiac perspective without any anginal complaints. He can proceed with surgery at an acceptable risk. Regarding his Plavix , he may hold for 5 days and resume once directed by surgeon.        Dispo: Follow up in 1 year.   Signed, Delon JAYSON Hoover, NP  "

## 2023-08-06 ENCOUNTER — Ambulatory Visit: Attending: Cardiology | Admitting: Cardiology

## 2023-08-06 VITALS — BP 150/80 | HR 75 | Ht 69.0 in | Wt 248.0 lb

## 2023-08-06 DIAGNOSIS — E782 Mixed hyperlipidemia: Secondary | ICD-10-CM | POA: Diagnosis not present

## 2023-08-06 DIAGNOSIS — I2510 Atherosclerotic heart disease of native coronary artery without angina pectoris: Secondary | ICD-10-CM

## 2023-08-06 DIAGNOSIS — Z01810 Encounter for preprocedural cardiovascular examination: Secondary | ICD-10-CM | POA: Diagnosis not present

## 2023-08-06 DIAGNOSIS — N201 Calculus of ureter: Secondary | ICD-10-CM | POA: Diagnosis not present

## 2023-08-06 DIAGNOSIS — I10 Essential (primary) hypertension: Secondary | ICD-10-CM

## 2023-08-06 DIAGNOSIS — R2681 Unsteadiness on feet: Secondary | ICD-10-CM | POA: Diagnosis not present

## 2023-08-06 DIAGNOSIS — M6281 Muscle weakness (generalized): Secondary | ICD-10-CM | POA: Diagnosis not present

## 2023-08-06 DIAGNOSIS — I4729 Other ventricular tachycardia: Secondary | ICD-10-CM

## 2023-08-06 DIAGNOSIS — E1100 Type 2 diabetes mellitus with hyperosmolarity without nonketotic hyperglycemic-hyperosmolar coma (NKHHC): Secondary | ICD-10-CM | POA: Diagnosis not present

## 2023-08-06 DIAGNOSIS — R278 Other lack of coordination: Secondary | ICD-10-CM | POA: Diagnosis not present

## 2023-08-06 NOTE — Patient Instructions (Signed)
 Medication Instructions:  Your physician recommends that you continue on your current medications as directed. Please refer to the Current Medication list given to you today.  *If you need a refill on your cardiac medications before your next appointment, please call your pharmacy*  Lab Work: None If you have labs (blood work) drawn today and your tests are completely normal, you will receive your results only by: MyChart Message (if you have MyChart) OR A paper copy in the mail If you have any lab test that is abnormal or we need to change your treatment, we will call you to review the results.  Testing/Procedures: None  Follow-Up: At [facility], you and your health needs are our priority.  As part of our continuing mission to provide you with exceptional heart care, our providers are all part of one team.  This team includes your primary Cardiologist (physician) and Advanced Practice Providers or APPs (Physician Assistants and Nurse Practitioners) who all work together to provide you with the care you need, when you need it.  Your next appointment:   1 year(s)  Provider:   Ralene Burger, MD    We recommend signing up for the patient portal called "MyChart".  Sign up information is provided on this After Visit Summary.  MyChart is used to connect with patients for Virtual Visits (Telemedicine).  Patients are able to view lab/test results, encounter notes, upcoming appointments, etc.  Non-urgent messages can be sent to your provider as well.   To learn more about what you can do with MyChart, go to [email or web address].   Other Instructions None

## 2023-08-11 ENCOUNTER — Encounter (HOSPITAL_COMMUNITY): Payer: Self-pay | Admitting: Urology

## 2023-08-11 NOTE — Progress Notes (Addendum)
 Spoke w/ via phone for pre-op interview--- Joel Lehmann, RN [facility] Lab needs dos----  CBG, BMP per anesthesia        Lab results------ Current EKG dated 08/06/23. Echo 12/04/21 60-65%. A1C 9.5 dated 07/14/23  COVID test -----patient states asymptomatic no test needed Arrive at -------0530 NPO after MN NO Solid Food.   Pre-Surgery Ensure or G2:  Med rec completed Medications to take morning of surgery ----- Levothyroxine , Norvasc  and Coreg . Diabetic medication -----NONE AM of surgery, 1/2 insulin  dose night before procedure.  GLP1 agonist last dose: GLP1 instructions:  Patient instructed no nail polish to be worn day of surgery Patient instructed to bring photo id and insurance card day of surgery Patient aware to have Driver (ride ) / caregiver    for 24 hours after surgery - [facility] to transport, [employer] (sister) to meet pt at hospital Patient Special Instructions ----- hold Plavix  5 days prior to surgery. Nursing home aware of these instructions. Pre-Op special Instructions -----  Patient verbalized understanding of instructions that were given at this phone interview. Patient denies chest pain, sob, fever, cough at the interview.   Cardiac clearance dated 08/06/23 in Epic by Pattricia Bores, NP.   Pt resides at [facility] nursing home, pt uses wheelchair but able to transfer with minimal assistance. Per RN at [facility], pt alert and oriented X4, no difficulty swallowing or taking medications. able to answer questions and sign consent. Sister [name] is pt POA, will be at hospital but arriving later. Pt to be transported by [facility].  Copy of MAR requested, will be faxed and added under chart review under the media tab, there are two entries labeled AMB correspondence under the date 08/11/23.

## 2023-08-17 NOTE — Anesthesia Preprocedure Evaluation (Signed)
 Anesthesia Evaluation  Patient identified by MRN, date of birth, ID band Patient awake    Reviewed: Allergy & Precautions, NPO status , Patient's Chart, lab work & pertinent test results, reviewed documented beta blocker date and time   Airway Mallampati: III  TM Distance: >3 FB Neck ROM: Full    Dental  (+) Edentulous Upper, Edentulous Lower, Dental Advisory Given   Pulmonary shortness of breath, former smoker   Pulmonary exam normal        Cardiovascular hypertension, Pt. on medications and Pt. on home beta blockers + angina  + CAD and + Cardiac Stents (x 1)  Normal cardiovascular exam Rhythm:Regular  Echo 11/2021  1. Left ventricular ejection fraction, by estimation, is 60 to 65%. The left ventricle has normal function. The left ventricle has no regional wall motion abnormalities. Left ventricular diastolic parameters are consistent with Grade II diastolic dysfunction (pseudonormalization).   2. Right ventricular systolic function is normal. The right ventricular size is normal. There is normal pulmonary artery systolic pressure.   3. The mitral valve is normal in structure. No evidence of mitral valve regurgitation. No evidence of mitral stenosis.   4. The aortic valve is normal in structure. Aortic valve regurgitation is not visualized. No aortic stenosis is present.   5. The inferior vena cava is normal in size with greater than 50% respiratory variability, suggesting right atrial pressure of 3 mmHg.     Neuro/Psych  Headaches PSYCHIATRIC DISORDERS Anxiety Depression     Neuromuscular disease    GI/Hepatic Neg liver ROS,GERD  ,,  Endo/Other  diabetes, Insulin  Dependent, Oral Hypoglycemic AgentsHypothyroidism    Renal/GU Renal disease     Musculoskeletal  (+) Arthritis ,    Abdominal  (+) + obese  Peds  Hematology  (+) Blood dyscrasia (Plavix )   Anesthesia Other Findings left flank pain  Reproductive/Obstetrics                              Anesthesia Physical Anesthesia Plan  ASA: 4  Anesthesia Plan: General   Post-op Pain Management: Tylenol  PO (pre-op)*   Induction: Intravenous  PONV Risk Score and Plan: 2 and Ondansetron , Dexamethasone  and Treatment may vary due to age or medical condition  Airway Management Planned: Oral ETT  Additional Equipment:   Intra-op Plan:   Post-operative Plan: Extubation in OR  Informed Consent: I have reviewed the patients History and Physical, chart, labs and discussed the procedure including the risks, benefits and alternatives for the proposed anesthesia with the patient or authorized representative who has indicated his/her understanding and acceptance.     Dental advisory given  Plan Discussed with: CRNA  Anesthesia Plan Comments:         Anesthesia Quick Evaluation

## 2023-08-18 ENCOUNTER — Ambulatory Visit (HOSPITAL_BASED_OUTPATIENT_CLINIC_OR_DEPARTMENT_OTHER): Admitting: Anesthesiology

## 2023-08-18 ENCOUNTER — Ambulatory Visit (HOSPITAL_COMMUNITY)
Admission: RE | Admit: 2023-08-18 | Discharge: 2023-08-18 | Disposition: A | Discharge status: Skilled Nursing Facility | Source: Home / Self Care | Attending: Urology | Admitting: Urology

## 2023-08-18 ENCOUNTER — Encounter (HOSPITAL_COMMUNITY)
Admission: RE | Disposition: A | Discharge status: Skilled Nursing Facility | Payer: Self-pay | Source: Home / Self Care | Attending: Urology

## 2023-08-18 ENCOUNTER — Encounter (HOSPITAL_COMMUNITY): Payer: Self-pay | Admitting: Urology

## 2023-08-18 ENCOUNTER — Ambulatory Visit (HOSPITAL_COMMUNITY)

## 2023-08-18 ENCOUNTER — Ambulatory Visit (HOSPITAL_COMMUNITY): Admitting: Anesthesiology

## 2023-08-18 DIAGNOSIS — E119 Type 2 diabetes mellitus without complications: Secondary | ICD-10-CM | POA: Diagnosis not present

## 2023-08-18 DIAGNOSIS — N200 Calculus of kidney: Secondary | ICD-10-CM | POA: Diagnosis not present

## 2023-08-18 DIAGNOSIS — N202 Calculus of kidney with calculus of ureter: Secondary | ICD-10-CM | POA: Diagnosis not present

## 2023-08-18 DIAGNOSIS — N12 Tubulo-interstitial nephritis, not specified as acute or chronic: Secondary | ICD-10-CM | POA: Diagnosis not present

## 2023-08-18 DIAGNOSIS — I10 Essential (primary) hypertension: Secondary | ICD-10-CM | POA: Diagnosis not present

## 2023-08-18 DIAGNOSIS — Z7984 Long term (current) use of oral hypoglycemic drugs: Secondary | ICD-10-CM | POA: Insufficient documentation

## 2023-08-18 DIAGNOSIS — E785 Hyperlipidemia, unspecified: Secondary | ICD-10-CM | POA: Diagnosis not present

## 2023-08-18 DIAGNOSIS — I2510 Atherosclerotic heart disease of native coronary artery without angina pectoris: Secondary | ICD-10-CM | POA: Insufficient documentation

## 2023-08-18 DIAGNOSIS — B9620 Unspecified Escherichia coli [E. coli] as the cause of diseases classified elsewhere: Secondary | ICD-10-CM | POA: Diagnosis not present

## 2023-08-18 DIAGNOSIS — Z79899 Other long term (current) drug therapy: Secondary | ICD-10-CM | POA: Diagnosis not present

## 2023-08-18 DIAGNOSIS — E7849 Other hyperlipidemia: Secondary | ICD-10-CM | POA: Diagnosis not present

## 2023-08-18 DIAGNOSIS — R6520 Severe sepsis without septic shock: Secondary | ICD-10-CM | POA: Diagnosis not present

## 2023-08-18 DIAGNOSIS — A4151 Sepsis due to Escherichia coli [E. coli]: Secondary | ICD-10-CM | POA: Diagnosis not present

## 2023-08-18 DIAGNOSIS — N132 Hydronephrosis with renal and ureteral calculous obstruction: Secondary | ICD-10-CM | POA: Diagnosis not present

## 2023-08-18 DIAGNOSIS — Z87891 Personal history of nicotine dependence: Secondary | ICD-10-CM | POA: Insufficient documentation

## 2023-08-18 DIAGNOSIS — R109 Unspecified abdominal pain: Secondary | ICD-10-CM | POA: Diagnosis not present

## 2023-08-18 DIAGNOSIS — E038 Other specified hypothyroidism: Secondary | ICD-10-CM | POA: Diagnosis not present

## 2023-08-18 DIAGNOSIS — N151 Renal and perinephric abscess: Secondary | ICD-10-CM | POA: Diagnosis not present

## 2023-08-18 DIAGNOSIS — L89896 Pressure-induced deep tissue damage of other site: Secondary | ICD-10-CM | POA: Diagnosis not present

## 2023-08-18 DIAGNOSIS — E1165 Type 2 diabetes mellitus with hyperglycemia: Secondary | ICD-10-CM | POA: Diagnosis not present

## 2023-08-18 DIAGNOSIS — G9341 Metabolic encephalopathy: Secondary | ICD-10-CM | POA: Diagnosis not present

## 2023-08-18 DIAGNOSIS — Z955 Presence of coronary angioplasty implant and graft: Secondary | ICD-10-CM | POA: Insufficient documentation

## 2023-08-18 DIAGNOSIS — E872 Acidosis, unspecified: Secondary | ICD-10-CM | POA: Diagnosis not present

## 2023-08-18 DIAGNOSIS — K219 Gastro-esophageal reflux disease without esophagitis: Secondary | ICD-10-CM | POA: Insufficient documentation

## 2023-08-18 DIAGNOSIS — Z8679 Personal history of other diseases of the circulatory system: Secondary | ICD-10-CM | POA: Diagnosis not present

## 2023-08-18 DIAGNOSIS — I480 Paroxysmal atrial fibrillation: Secondary | ICD-10-CM | POA: Diagnosis not present

## 2023-08-18 DIAGNOSIS — D72829 Elevated white blood cell count, unspecified: Secondary | ICD-10-CM | POA: Diagnosis not present

## 2023-08-18 DIAGNOSIS — E871 Hypo-osmolality and hyponatremia: Secondary | ICD-10-CM | POA: Diagnosis not present

## 2023-08-18 DIAGNOSIS — R7881 Bacteremia: Secondary | ICD-10-CM | POA: Diagnosis not present

## 2023-08-18 DIAGNOSIS — N201 Calculus of ureter: Secondary | ICD-10-CM | POA: Diagnosis not present

## 2023-08-18 DIAGNOSIS — E875 Hyperkalemia: Secondary | ICD-10-CM | POA: Diagnosis not present

## 2023-08-18 DIAGNOSIS — R310 Gross hematuria: Secondary | ICD-10-CM | POA: Diagnosis not present

## 2023-08-18 DIAGNOSIS — Z794 Long term (current) use of insulin: Secondary | ICD-10-CM | POA: Diagnosis not present

## 2023-08-18 DIAGNOSIS — E1100 Type 2 diabetes mellitus with hyperosmolarity without nonketotic hyperglycemic-hyperosmolar coma (NKHHC): Secondary | ICD-10-CM

## 2023-08-18 DIAGNOSIS — N390 Urinary tract infection, site not specified: Secondary | ICD-10-CM | POA: Diagnosis not present

## 2023-08-18 DIAGNOSIS — A419 Sepsis, unspecified organism: Secondary | ICD-10-CM | POA: Diagnosis not present

## 2023-08-18 DIAGNOSIS — E538 Deficiency of other specified B group vitamins: Secondary | ICD-10-CM | POA: Diagnosis not present

## 2023-08-18 DIAGNOSIS — D696 Thrombocytopenia, unspecified: Secondary | ICD-10-CM | POA: Diagnosis not present

## 2023-08-18 DIAGNOSIS — K5730 Diverticulosis of large intestine without perforation or abscess without bleeding: Secondary | ICD-10-CM | POA: Diagnosis not present

## 2023-08-18 DIAGNOSIS — Z960 Presence of urogenital implants: Secondary | ICD-10-CM | POA: Diagnosis not present

## 2023-08-18 DIAGNOSIS — N179 Acute kidney failure, unspecified: Secondary | ICD-10-CM | POA: Diagnosis not present

## 2023-08-18 DIAGNOSIS — E039 Hypothyroidism, unspecified: Secondary | ICD-10-CM | POA: Diagnosis not present

## 2023-08-18 DIAGNOSIS — R319 Hematuria, unspecified: Secondary | ICD-10-CM | POA: Diagnosis not present

## 2023-08-18 HISTORY — PX: CYSTOSCOPY/URETEROSCOPY/HOLMIUM LASER/STENT PLACEMENT: SHX6546

## 2023-08-18 LAB — GLUCOSE, CAPILLARY
Glucose-Capillary: 184 mg/dL — ABNORMAL HIGH (ref 70–99)
Glucose-Capillary: 196 mg/dL — ABNORMAL HIGH (ref 70–99)

## 2023-08-18 LAB — BASIC METABOLIC PANEL WITH GFR
Anion gap: 8 (ref 5–15)
BUN: 12 mg/dL (ref 8–23)
CO2: 24 mmol/L (ref 22–32)
Calcium: 9.5 mg/dL (ref 8.9–10.3)
Chloride: 104 mmol/L (ref 98–111)
Creatinine, Ser: 0.74 mg/dL (ref 0.61–1.24)
GFR, Estimated: >60 mL/min (ref >60)
Glucose, Bld: 190 mg/dL — ABNORMAL HIGH (ref 70–99)
Potassium: 4.2 mmol/L (ref 3.5–5.1)
Sodium: 136 mmol/L (ref 135–145)

## 2023-08-18 SURGERY — CYSTOSCOPY/URETEROSCOPY/HOLMIUM LASER/STENT PLACEMENT
Anesthesia: General | Site: Ureter | Laterality: Bilateral

## 2023-08-18 MED ORDER — PROPOFOL 10 MG/ML IV BOLUS
INTRAVENOUS | Status: DC | PRN
  Administered 2023-08-18: 110 mg via INTRAVENOUS

## 2023-08-18 MED ORDER — TRAMADOL HCL 50 MG PO TABS: 50.0000 mg | ORAL_TABLET | Freq: Three times a day (TID) | ORAL | 0 refills | Status: DC | PRN

## 2023-08-18 MED ORDER — ACETAMINOPHEN 500 MG PO TABS
1000.0000 mg | ORAL_TABLET | Freq: Once | ORAL | Status: AC
  Administered 2023-08-18: 1000 mg via ORAL

## 2023-08-18 MED ORDER — ONDANSETRON HCL 4 MG/2ML IJ SOLN
INTRAMUSCULAR | Status: DC | PRN
  Administered 2023-08-18: 4 mg via INTRAVENOUS

## 2023-08-18 MED ORDER — ONDANSETRON HCL 4 MG/2ML IJ SOLN
INTRAMUSCULAR | Status: AC
  Filled 2023-08-18: qty 2

## 2023-08-18 MED ORDER — SODIUM CHLORIDE 0.9 % IR SOLN
Status: DC | PRN
  Administered 2023-08-18: 6000 mL via INTRAVESICAL

## 2023-08-18 MED ORDER — DEXAMETHASONE SODIUM PHOSPHATE 10 MG/ML IJ SOLN
INTRAMUSCULAR | Status: AC
  Filled 2023-08-18: qty 1

## 2023-08-18 MED ORDER — PHENYLEPHRINE 80 MCG/ML (10ML) SYRINGE FOR IV PUSH (FOR BLOOD PRESSURE SUPPORT)
PREFILLED_SYRINGE | INTRAVENOUS | Status: DC | PRN
Start: 2023-08-18 — End: 2023-08-18
  Administered 2023-08-18: 80 ug via INTRAVENOUS

## 2023-08-18 MED ORDER — DROPERIDOL 2.5 MG/ML IJ SOLN: 0.6250 mg | Freq: Once | INTRAMUSCULAR | Status: DC | PRN

## 2023-08-18 MED ORDER — NITROFURANTOIN MONOHYD MACRO 100 MG PO CAPS: 100.0000 mg | ORAL_CAPSULE | Freq: Two times a day (BID) | ORAL | 0 refills | Status: DC

## 2023-08-18 MED ORDER — CIPROFLOXACIN IN D5W 400 MG/200ML IV SOLN
400.0000 mg | INTRAVENOUS | Status: AC
  Administered 2023-08-18: 400 mg via INTRAVENOUS

## 2023-08-18 MED ORDER — FENTANYL CITRATE (PF) 250 MCG/5ML IJ SOLN
INTRAMUSCULAR | Status: DC | PRN
  Administered 2023-08-18 (×4): 50 ug via INTRAVENOUS

## 2023-08-18 MED ORDER — FENTANYL CITRATE (PF) 250 MCG/5ML IJ SOLN
INTRAMUSCULAR | Status: AC
Start: 2023-08-18 — End: ?
  Filled 2023-08-18: qty 5

## 2023-08-18 MED ORDER — DEXAMETHASONE SODIUM PHOSPHATE 10 MG/ML IJ SOLN
INTRAMUSCULAR | Status: DC | PRN
  Administered 2023-08-18: 10 mg via INTRAVENOUS

## 2023-08-18 MED ORDER — HYOSCYAMINE SULFATE 0.125 MG SL SUBL: 0.1250 mg | SUBLINGUAL_TABLET | Freq: Four times a day (QID) | SUBLINGUAL | 0 refills | Status: AC | PRN

## 2023-08-18 MED ORDER — LIDOCAINE HCL URETHRAL/MUCOSAL 2 % EX GEL
CUTANEOUS | Status: AC
  Filled 2023-08-18: qty 11

## 2023-08-18 MED ORDER — MELOXICAM 15 MG PO TABS: 15.0000 mg | ORAL_TABLET | Freq: Every day | ORAL | 0 refills | Status: AC

## 2023-08-18 MED ORDER — INSULIN ASPART 100 UNIT/ML IJ SOLN
0.0000 [IU] | INTRAMUSCULAR | Status: DC | PRN
  Administered 2023-08-18: 2 [IU] via SUBCUTANEOUS

## 2023-08-18 MED ORDER — CIPROFLOXACIN IN D5W 400 MG/200ML IV SOLN
INTRAVENOUS | Status: AC
  Filled 2023-08-18: qty 200

## 2023-08-18 MED ORDER — LIDOCAINE 2% (20 MG/ML) 5 ML SYRINGE
INTRAMUSCULAR | Status: AC
  Filled 2023-08-18: qty 5

## 2023-08-18 MED ORDER — PROPOFOL 10 MG/ML IV BOLUS
INTRAVENOUS | Status: AC
  Filled 2023-08-18: qty 20

## 2023-08-18 MED ORDER — LACTATED RINGERS IV SOLN: INTRAVENOUS | Status: DC

## 2023-08-18 MED ORDER — CHLORHEXIDINE GLUCONATE 0.12 % MT SOLN
OROMUCOSAL | Status: AC
  Filled 2023-08-18: qty 15

## 2023-08-18 MED ORDER — LIDOCAINE 2% (20 MG/ML) 5 ML SYRINGE
INTRAMUSCULAR | Status: DC | PRN
  Administered 2023-08-18: 100 mg via INTRAVENOUS

## 2023-08-18 MED ORDER — SUGAMMADEX SODIUM 200 MG/2ML IV SOLN
INTRAVENOUS | Status: DC | PRN
  Administered 2023-08-18: 230 mg via INTRAVENOUS

## 2023-08-18 MED ORDER — IOHEXOL 300 MG/ML  SOLN
INTRAMUSCULAR | Status: DC | PRN
  Administered 2023-08-18: 40 mL via URETHRAL

## 2023-08-18 MED ORDER — ORAL CARE MOUTH RINSE: 15.0000 mL | Freq: Once | OROMUCOSAL | Status: AC

## 2023-08-18 MED ORDER — MIDAZOLAM HCL 2 MG/2ML IJ SOLN
INTRAMUSCULAR | Status: AC
  Filled 2023-08-18: qty 2

## 2023-08-18 MED ORDER — ACETAMINOPHEN 500 MG PO TABS
ORAL_TABLET | ORAL | Status: AC
  Filled 2023-08-18: qty 2

## 2023-08-18 MED ORDER — ROCURONIUM BROMIDE 10 MG/ML (PF) SYRINGE
PREFILLED_SYRINGE | INTRAVENOUS | Status: DC | PRN
  Administered 2023-08-18: 10 mg via INTRAVENOUS
  Administered 2023-08-18: 40 mg via INTRAVENOUS
  Administered 2023-08-18: 20 mg via INTRAVENOUS
  Administered 2023-08-18: 10 mg via INTRAVENOUS

## 2023-08-18 MED ORDER — CHLORHEXIDINE GLUCONATE 0.12 % MT SOLN
15.0000 mL | Freq: Once | OROMUCOSAL | Status: AC
  Administered 2023-08-18: 15 mL via OROMUCOSAL

## 2023-08-18 MED ORDER — HYDROMORPHONE HCL 1 MG/ML IJ SOLN: 0.2500 mg | INTRAMUSCULAR | Status: DC | PRN

## 2023-08-18 MED ORDER — MIDAZOLAM HCL 2 MG/2ML IJ SOLN
INTRAMUSCULAR | Status: DC | PRN
  Administered 2023-08-18: 1 mg via INTRAVENOUS

## 2023-08-18 MED ORDER — PHENYLEPHRINE 80 MCG/ML (10ML) SYRINGE FOR IV PUSH (FOR BLOOD PRESSURE SUPPORT)
PREFILLED_SYRINGE | INTRAVENOUS | Status: AC
  Filled 2023-08-18: qty 10

## 2023-08-18 SURGICAL SUPPLY — 26 items
BAG URO CATCHER STRL LF (MISCELLANEOUS) ×1 IMPLANT
BASKET ZERO TIP NITINOL 2.4FR (BASKET) IMPLANT
BENZOIN TINCTURE PRP APPL 2/3 (GAUZE/BANDAGES/DRESSINGS) IMPLANT
CATH URETERAL DUAL LUMEN 10F (MISCELLANEOUS) IMPLANT
CATH URETL OPEN END 6FR 70 (CATHETERS) ×1 IMPLANT
CLOTH BEACON ORANGE TIMEOUT ST (SAFETY) ×1 IMPLANT
DRSG TEGADERM 4X4.75 (GAUZE/BANDAGES/DRESSINGS) IMPLANT
ELECTRODE REM PT RTRN 9FT ADLT (ELECTROSURGICAL) IMPLANT
FIBER LASER FLEXIVA 365 (UROLOGICAL SUPPLIES) IMPLANT
FIBER LASER TRACTIP 200 (UROLOGICAL SUPPLIES) IMPLANT
GLOVE BIO SURGEON STRL SZ7 (GLOVE) ×1 IMPLANT
GOWN STRL REUS W/ TWL LRG LVL3 (GOWN DISPOSABLE) ×1 IMPLANT
GUIDEWIRE STR DUAL SENSOR (WIRE) ×1 IMPLANT
GUIDEWIRE ZIPWRE .038 STRAIGHT (WIRE) IMPLANT
KIT TURNOVER KIT B (KITS) ×1 IMPLANT
MANIFOLD NEPTUNE II (INSTRUMENTS) ×1 IMPLANT
NS IRRIG 500ML POUR BTL (IV SOLUTION) ×1 IMPLANT
PACK CYSTO (CUSTOM PROCEDURE TRAY) ×1 IMPLANT
SHEATH NAVIGATOR HD 11/13X28 (SHEATH) IMPLANT
SHEATH NAVIGATOR HD 12/14X36 (SHEATH) IMPLANT
SHEATH NAVIGATOR HD 12/14X46 (SHEATH) IMPLANT
SLEEVE SCD COMPRESS KNEE MED (STOCKING) ×1 IMPLANT
SOL .9 NS 3000ML IRR UROMATIC (IV SOLUTION) ×2 IMPLANT
STENT CONTOUR 6FRX26X.038 (STENTS) IMPLANT
TUBE CONNECTING 12X1/4 (SUCTIONS) IMPLANT
TUBING UROLOGY SET (TUBING) ×1 IMPLANT

## 2023-08-18 NOTE — Transfer of Care (Signed)
 Immediate Anesthesia Transfer of Care Note  Patient: [name]  Procedure(s) Performed: CYSTOSCOPY/URETEROSCOPY/HOLMIUM LASER/STENT PLACEMENT (Bilateral: Ureter)  Patient Location: PACU  Anesthesia Type:General  Level of Consciousness: awake and sedated  Airway & Oxygen Therapy: Patient Spontanous Breathing and Patient connected to face mask oxygen  Post-op Assessment: Report given to RN and Post -op Vital signs reviewed and stable  Post vital signs: Reviewed and stable  Last Vitals:  Vitals Value Taken Time  BP 176/90 08/18/23 1001  Temp    Pulse 67 08/18/23 1004  Resp 28 08/18/23 1004  SpO2 93 % 08/18/23 1004  Vitals shown include unfiled device data.  Last Pain:  Vitals:   08/18/23 0652  TempSrc: Oral  PainSc: 8       Patients Stated Pain Goal: 5 (08/18/23 1914)  Complications: No notable events documented.

## 2023-08-18 NOTE — Progress Notes (Signed)
 PACU NURSING NOTE Phone call made to [facility], spoke with [name] at facility, reviewed current pt status, also reviewed AVS per attending MD. Informed that copy of AVS will accompany client back to facility. [name] stated she will send driver from facility to transport client back to nursing facility. Client has belongings, Hearing Aids provided to client, inserted bilateral by patient himself. Stent Strings x 2 easily visualized. Client assisted with dsg and assisted with tx back to [facility]

## 2023-08-18 NOTE — OR Nursing (Signed)
 Right ureteral stent removed at 0803

## 2023-08-18 NOTE — H&P (Signed)
 H&P  History of Present Illness: [name] is a 78 y.o. year old M who presents today for treatment of ureteral stones  No acute complaints  Past Medical History:  Diagnosis Date   Abnormal stress test    Anxiety    Arthritis    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HCC) 03/14/2013   Atypical chest pain 12/28/2013   CAD in native artery 03/14/2013   Cervical disc disorder at C5-C6 level with radiculopathy 06/09/2022   Cervical radiculopathy at C5 06/10/2022   Chest pain 07/30/2011   1999 Cath - <50% stenosis 2008 NUC - low risk    Chronic back pain    herniated disc   Coronary artery disease    takes Plavix  and ASA daily   Depression    takes Prozac  daily   Diabetes mellitus type 2, controlled, with complications (HCC) 07/30/2011   Displacement of lumbar intervertebral disc without myelopathy 05/30/2014   Diverticulitis    hx of   Dizziness    occasionally and states Dr.Smith is aware   DJD (degenerative joint disease)    Essential hypertension, benign 07/30/2011   Fatigue 09/06/2019   GERD (gastroesophageal reflux disease)    takes an OTC antacid   Gout    yrs ago and doesn't take any meds   Hard of hearing    wears hearing aids   Headache(784.0)    occasionally   History of colon polyps    benign   History of fusion of cervical spine 09/04/2020   Last Assessment & Plan:   Formatting of this note might be different from the original.  Review of recent cervical spine CT on care everywhere notes ACDF from C5-7 without hardware complication.  At this time he is doing well without any symptomatic issues.   History of kidney stones    HNP (herniated nucleus pulposus), lumbar 06/14/2014   Hyperlipidemia    takes Atorvastatin  daily   Hypertension    takes Amlodipine ,Losartan ,and Coreg  daily   Hypothyroidism    takes Synthroid  daily   IBS (irritable bowel syndrome)    Lumbar post-laminectomy syndrome 09/04/2020   Last  Assessment & Plan:   Formatting of this note might be different from the original.  78 year old male with chronic low back pain history of multiple lumbar spine surgeries, he has had a multilevel decompression and is fused from L4 to the sacrum.  His lumbar MRI completed in 2017 shows improvement compared with prior status post left L3-4 diskectomy in March of 2016 there is mild residual mass   Nontraumatic complete tear of right rotator cuff 06/09/2022   Obesity, unspecified 07/30/2011   Other spondylosis, lumbar region 02/12/2021   Pain medication agreement 09/04/2020   Last Assessment & Plan:   Formatting of this note might be different from the original.  Agreement up-to-date.  Patient and I have discussed the hazardous effects of continued opiate pain medication usage. Risks and benefits of above medications including but not limited to possibility of respiratory depression, sedation, and even death were discussed with the patient who expressed an understandin   PAT (paroxysmal atrial tachycardia) (HCC) 10/12/2020   Preoperative cardiovascular examination 09/06/2019   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   Right sided weakness 06/04/2022   Shortness of breath dyspnea    with exertion   Swelling of lower limb 06/20/2014   Tuberculosis    Patient denies Tb   Type II diabetes mellitus (HCC)    takes Metformin   daily   Unstable angina (HCC) 12/27/2013   Weakness    numbness and tingling in left leg    Past Surgical History:  Procedure Laterality Date   ANTERIOR CERVICAL DECOMP/DISCECTOMY FUSION  04/2007   C4-5; C6-7   BACK SURGERY     "3 times; last time was screws in lower back 10/2008"   CHOLECYSTECTOMY  1970's   COLONOSCOPY     CORONARY ANGIOPLASTY  1999/2013   1 stent   CYSTOSCOPY     CYSTOSCOPY/RETROGRADE/URETEROSCOPY/STONE EXTRACTION WITH BASKET Bilateral 07/13/2023   Procedure: CYSTOSCOPY, BILATERAL URETERAL STENT PLACEMENT, BILATERAL RETROGRADE PYELOGRAM;   Surgeon: Mallie Seal, MD;  Location: [facility] OR;  Service: Urology;  Laterality: Bilateral;   LEFT HEART CATH AND CORONARY ANGIOGRAPHY N/A 09/30/2019   Procedure: LEFT HEART CATH AND CORONARY ANGIOGRAPHY;  Surgeon: Lucendia Rusk, MD;  Location: [facility] INVASIVE CV LAB;  Service: Cardiovascular;  Laterality: N/A;   LEFT HEART CATHETERIZATION WITH CORONARY ANGIOGRAM N/A 08/21/2011   Procedure: LEFT HEART CATHETERIZATION WITH CORONARY ANGIOGRAM;  Surgeon: Mickiel Albany, MD;  Location: [facility] CATH LAB;  Service: Cardiovascular;  Laterality: N/A;   LITHOTRIPSY     LUMBAR LAMINECTOMY/DECOMPRESSION MICRODISCECTOMY Left 06/14/2014   Procedure: LEFT LUMBAR THREE-FOUR LANINOTOMY AND MICRODISKECTOMY.;  Surgeon: Corrina Dimitri, MD;  Location: [facility] NEURO ORS;  Service: Neurosurgery;  Laterality: Left;  left   PCI  5/09/2-13   LAD   RADIOLOGY WITH ANESTHESIA N/A 11/13/2015   Procedure: MRI OF LUMBAR SPINE W/WO CONTRAST    (RADIOLOGY WITH ANESTHESIA);  Surgeon: Medication Radiologist, MD;  Location: [facility] OR;  Service: Radiology;  Laterality: N/A;   RADIOLOGY WITH ANESTHESIA N/A 06/05/2022   Procedure: MRI WITH ANESTHESIA;  Surgeon: Radiologist, Medication, MD;  Location: [facility] OR;  Service: Radiology;  Laterality: N/A;   RADIOLOGY WITH ANESTHESIA N/A 06/08/2022   Procedure: MRI WITH ANESTHESIA;  Surgeon: Radiologist, Medication, MD;  Location: [facility] OR;  Service: Radiology;  Laterality: N/A;   SHOULDER ARTHROSCOPY W/ ROTATOR CUFF REPAIR  ~ 2011   left    Home Medications:  Current Meds  Medication Sig   acetaminophen  (TYLENOL ) 500 MG tablet Take 1,000 mg by mouth in the morning, at noon, and at bedtime.   amLODipine  (NORVASC ) 5 MG tablet Take 1 tablet (5 mg total) by mouth daily.   aspirin  81 MG tablet Take 81 mg by mouth daily.   atorvastatin  (LIPITOR ) 80 MG tablet Take 80 mg by mouth daily.   buPROPion  (WELLBUTRIN  XL) 150 MG 24 hr tablet Take 150 mg by mouth daily.   carvedilol  (COREG ) 6.25 MG tablet Take 6.25 mg  by mouth 2 (two) times daily with a meal.   Cholecalciferol  25 MCG (1000 UT) tablet Take 1,000 Units by mouth daily.   Cranberry 450 MG TABS Take 1 tablet by mouth daily.   insulin  lispro (HUMALOG) 100 UNIT/ML injection Inject 0-9 Units into the skin in the morning and at bedtime. Sliding Scale: 0-150: 0 151-200: 2 201-250: 3 251-300: 5 301-350: 7 351-400: 9   levofloxacin (LEVAQUIN) 250 MG tablet Take 250 mg by mouth 3 (three) times a week.   levothyroxine  (SYNTHROID , LEVOTHROID) 50 MCG tablet Take 50 mcg by mouth daily before breakfast.   lidocaine  (LIDODERM ) 5 % Place 1 patch onto the skin daily. Remove & Discard patch within 12 hours or as directed by MD   loratadine (CLARITIN) 10 MG tablet Take 10 mg by mouth daily.   LORazepam  (ATIVAN ) 1 MG tablet Take 1 tablet (1 mg  total) by mouth every 12 (twelve) hours as needed for anxiety.   losartan  (COZAAR ) 50 MG tablet Take 50 mg by mouth daily.   metFORMIN  (GLUCOPHAGE ) 1000 MG tablet Take 1,000 mg by mouth 2 (two) times daily with a meal.   polyethylene glycol powder (GLYCOLAX /MIRALAX ) 17 GM/SCOOP powder Take 17 g by mouth in the morning.   saccharomyces boulardii (FLORASTOR) 250 MG capsule Take 250 mg by mouth 2 (two) times daily.   Sertraline  HCl 150 MG CAPS Take 150 mg by mouth daily.   tamsulosin (FLOMAX) 0.4 MG CAPS capsule Take 0.4 mg by mouth daily.   traMADol  (ULTRAM ) 50 MG tablet Take 1 tablet (50 mg total) by mouth every 8 (eight) hours as needed for moderate pain (pain score 4-6) or severe pain (pain score 7-10).   traZODone (DESYREL) 100 MG tablet Take 100 mg by mouth at bedtime.    Allergies:  Allergies  Allergen Reactions   Trimethoprim    Misc. Sulfonamide Containing Compounds Rash   Sulfa Antibiotics Rash    Family History  Problem Relation Age of Onset   Cancer Father        colon   Arthritis Mother    Healthy Sister    Heart attack Brother    Heart disease Brother    Hypertension Brother    Healthy Sister      Social History:  reports that he has quit smoking. His smokeless tobacco use includes chew. He reports that he does not drink alcohol and does not use drugs.  ROS: A complete review of systems was performed.  All systems are negative except for pertinent findings as noted.  Physical Exam:  Vital signs in last 24 hours: Temp:  [98.2 F (36.8 C)] 98.2 F (36.8 C) (05/06 0652) Pulse Rate:  [78] 78 (05/06 0652) Resp:  [18] 18 (05/06 0652) BP: (169)/(79) 169/79 (05/06 0652) SpO2:  [97 %] 97 % (05/06 0652) Weight:  [110.2 kg] 110.2 kg (05/06 1610) Constitutional:  Alert and oriented, No acute distress Cardiovascular: Regular rate and rhythm Respiratory: Normal respiratory effort, Lungs clear bilaterally GI: Abdomen is soft, nontender, nondistended, no abdominal masses Lymphatic: No lymphadenopathy Neurologic: Grossly intact, no focal deficits Psychiatric: Normal mood and affect   Laboratory Data:  No results for input(s): "WBC", "HGB", "HCT", "PLT" in the last 72 hours.  No results for input(s): "NA", "K", "CL", "GLUCOSE", "BUN", "CALCIUM ", "CREATININE" in the last 72 hours.  Invalid input(s): "CO3"   Results for orders placed or performed during the hospital encounter of 08/18/23 (from the past 24 hours)  Glucose, capillary     Status: Abnormal   Collection Time: 08/18/23  6:46 AM  Result Value Ref Range   Glucose-Capillary 184 (H) 70 - 99 mg/dL   No results found for this or any previous visit (from the past 240 hours).  Renal Function: No results for input(s): "CREATININE" in the last 168 hours. CrCl cannot be calculated (Patient's most recent lab result is older than the maximum 21 days allowed.).  Radiologic Imaging: No results found.  Assessment:  BILL WIED is a 78 y.o. year old M with bilateral obstructing stones s/p bilateral stent placement   Plan:  --to OR as planned for staged left ureteroscopy with laser litho, stent. Will attempt bilateral but  given size of right stone, feel it is unlikely I will be able to adequately address both stones in one OR setting. Procedure and risks reviewed, including but not limited to hematuria, infection, sepsis, damage  to GU tract, failure to complete procedure, retained stone fragments, need for future procedures, stent pain, prolonged stent.   Julene Oaks, MD 08/18/2023, 7:18 AM  [facility] Pager: [phone]

## 2023-08-18 NOTE — Anesthesia Procedure Notes (Signed)
 Procedure Name: Intubation Date/Time: 08/18/2023 7:45 AM  Performed by: Laveda Demedeiros C, CRNAPre-anesthesia Checklist: Patient identified, Emergency Drugs available, Suction available and Patient being monitored Patient Re-evaluated:Patient Re-evaluated prior to induction Oxygen Delivery Method: Circle system utilized Preoxygenation: Pre-oxygenation with 100% oxygen Induction Type: IV induction Ventilation: Mask ventilation without difficulty Laryngoscope Size: Mac and 4 Grade View: Grade I Tube type: Oral Number of attempts: 1 Airway Equipment and Method: Stylet and Oral airway Placement Confirmation: ETT inserted through vocal cords under direct vision, positive ETCO2 and breath sounds checked- equal and bilateral Secured at: 21 cm Tube secured with: Tape Dental Injury: Teeth and Oropharynx as per pre-operative assessment

## 2023-08-18 NOTE — OR Nursing (Signed)
 Left ureteral stent removed at 0803

## 2023-08-18 NOTE — Discharge Instructions (Signed)
 [facility] [phone] Post Ureteroscopy With or Without Stent Instructions **remove stents by pulling on strings in 1 week  Definitions:  Ureter: The duct that transports urine from the kidney to the bladder. Stent:   A plastic hollow tube that is placed into the ureter, from the kidney to the bladder to prevent the ureter from swelling shut.  GENERAL INSTRUCTIONS:  Despite the fact that no skin incisions were used, the area around the ureter and bladder is raw and irritated. The stent is a foreign body which will further irritate the bladder wall. This irritation is manifested by increased frequency of urination, both day and night, and by an increase in the urge to urinate. In some, the urge to urinate is present almost always. Sometimes the urge is strong enough that you may not be able to stop yourself from urinating. The only real cure is to remove the stent and then give time for the bladder wall to heal which can't be done until the danger of the ureter swelling shut has passed, which varies.  You may see some blood in your urine while the stent is in place and a few days afterwards. Do not be alarmed, even if the urine was clear for a while. Get off your feet and drink lots of fluids until clearing occurs. If you start to pass clots or don't improve, call us .  DIET: You may return to your normal diet immediately. Because of the raw surface of your bladder, alcohol, spicy foods, acid type foods and drinks with caffeine may cause irritation or frequency and should be used in moderation. To keep your urine flowing freely and to avoid constipation, drink plenty of fluids during the day ( 8-10 glasses ). Tip: Avoid cranberry juice because it is very acidic.  ACTIVITY: Your physical activity doesn't need to be restricted. However, if you are very active, you may see some blood in your urine. We suggest that you reduce your activity under these circumstances until the  bleeding has stopped.  BOWELS: It is important to keep your bowels regular during the postoperative period. Straining with bowel movements can cause bleeding. A bowel movement every other day is reasonable. Use a mild laxative if needed, such as Milk of Magnesia 2-3 tablespoons, or 2 Dulcolax tablets. Call if you continue to have problems. If you have been taking narcotics for pain, before, during or after your surgery, you may be constipated. Take a laxative if necessary.   MEDICATION: You should resume your pre-surgery medications unless told not to. In addition you will often be given an antibiotic to prevent infection and likely several as needed medications for stent related discomfort. These should be taken as prescribed until the bottles are finished unless you are having an unusual reaction to one of the drugs.  PROBLEMS YOU SHOULD REPORT TO US : Fevers over 100.5 Fahrenheit. Heavy bleeding, or clots ( See above notes about blood in urine ). Inability to urinate. Drug reactions ( hives, rash, nausea, vomiting, diarrhea ). Severe burning or pain with urination that is not improving.

## 2023-08-18 NOTE — Op Note (Signed)
 Operative Note  Preoperative diagnosis:  1.  Bilateral ureteral stones 2. Bilateral hydronephrosis  Postoperative diagnosis: 1.  same  Procedure(s): 1.  Bilateral ureteroscopy with laser lithotripsy and dusting of stones 2. Cystoscopy  3. Bilateral retrograde pyelogram 4. Bilateral ureteral stent placement 6x24 cm 5. Fluoroscopy with intraoperative interpretation  Surgeon: Julene Oaks, MD  Assistants:  None  Anesthesia:  General  Complications:  None  EBL:  Minimal  Specimens: 1. none  Drains/Catheters: 1.  Bilateral 6Fr x 24cm ureteral stent with short tether string  Intraoperative findings:   Cystoscopy demonstrated enlarged prostate Left Ureteroscopy demonstrated stone had been pushed into the collecting system; right ureteroscopy demonstrated stone in collecting system Successful stent placement bilaterally   Left retrograde pyelogram - dilation of the ureter and collecting system; no ureteral filling defects. There was a filling defect in the collectin system consistent with the stone  Right retrograde pyelogram - no ureteral dilation, hydro of the collecting system; no ureteral filling defects. There was a filling defect in the collectin system consistent with the stone  Description of procedure: After informed consent was obtained from the patient, the patient was identified and taken to the operating room and placed in the supine position.  General anesthesia was administered as well as perioperative IV antibiotics.  At the beginning of the case, a time-out was performed to properly identify the patient, the surgery to be performed, and the surgical site.  Sequential compression devices were applied to the lower extremities at the beginning of the case for DVT prophylaxis.  The patient was then placed in the dorsal lithotomy supine position, prepped and draped in sterile fashion.  We then passed the 21-French rigid cystoscope through the urethra and into the  bladder under vision without any difficulty , noting a normal urethra without strictures and an enlarged prostate.  A systematic evaluation of the bladder revealed no evidence of any suspicious bladder lesions.  Ureteral orifices were in normal position.    The distal aspect of the ureteral stent was seen protruding from the leftureteral orifice.  We then used the alligator-tooth forceps and grasped the distal end of the ureteral stent and brought it out the urethral meatus while watching the proximal coil straighten out nicely on fluoroscopy. Through the ureteral stent, we then passed a 0.038 glide wire up to the level of the renal pelvis.  The ureteral stent was then removed, leaving the glide wire up the left ureter.  The cystoscope was withdrawn, and a dual lumen catheter was inserted over the glide wire into the distal ureter. A gentle retrograde pyelogram was performed, revealing a normal caliber ureter without any filling defects. There was hydronephrosis of the collecting system. There was a filling defect in the collecting system corresponding to the stone. A 0.038 sensor wire was then passed up to the level of the renal pelvis and secured to the drape as a safety wire. The dual lumen was removed.  An 11/13Fr ureteral access sheath was carefully advanced up the ureter to the level of the UPJ over this wire under fluoroscopic guidance. The flexible ureteroscope was advanced into the collecting system via the access sheath. The collecting system was inspected. The calculus was identified at the renal pelvis. Using the 242 micron holmium laser fiber, the stone was dusted completely. These were sent for chemical analysis. With the ureteroscope in the kidney, a gentle pyelogram was performed to delineate the calyceal system and we evaluated the calyces systematically. We encountered no further fragments >  2mm. The rest of the stone fragments were very tiny and these were  irrigated away gently. The calyces  were re-inspected and there were no significant stone fragment residual.   We then withdrew the ureteroscope back down the ureter along with the access sheath, noting no evidence of any stones along the course of the ureter.  Prior to removing the ureteroscope, we did pass the Glidewire back up to the ureter to the renal pelvis.  Once the ureteroscope was removed, we then used the Glidewire under fluoroscopic guidance and passed up a 6-French x 24 cm double-pigtail ureteral stent up the ureter, making sure that the proximal and distal ends coiled within the kidney and bladder respectively.  The procedure was repeated in an identical fashion on the right with findings as above   Note that we left tether strings attached to the distal end of the ureteral stent and it exited the urethral meatus with a short tether.  The cystoscope was then advanced back into the bladder under vision.  We were able to see the distal stent coiling nicely within the bladder.  The bladder was then emptied with irrigation solution.  The cystoscope was then removed.    The patient tolerated the procedure well and there was no complication. Patient was awoken from anesthesia and taken to the recovery room in stable condition. I was present and scrubbed for the entirety of the case.  Plan:  Patient will be discharged home and may remove stent in 7 days   G. Julene Oaks MD [facility]  Pager: [phone]

## 2023-08-19 ENCOUNTER — Encounter (HOSPITAL_COMMUNITY): Payer: Self-pay | Admitting: Urology

## 2023-08-19 NOTE — Anesthesia Postprocedure Evaluation (Signed)
 Anesthesia Post Note  Patient: [name]  Procedure(s) Performed: CYSTOSCOPY/URETEROSCOPY/HOLMIUM LASER/STENT PLACEMENT (Bilateral: Ureter)     Patient location during evaluation: PACU Anesthesia Type: General Level of consciousness: sedated and patient cooperative Pain management: pain level controlled Vital Signs Assessment: post-procedure vital signs reviewed and stable Respiratory status: spontaneous breathing Cardiovascular status: stable Anesthetic complications: no   No notable events documented.  Last Vitals:  Vitals:   08/18/23 1030 08/18/23 1040  BP:  (!) 152/76  Pulse:    Resp:    Temp:  36.6 C  SpO2: 94% 94%    Last Pain:  Vitals:   08/18/23 1001  TempSrc:   PainSc: Asleep                 Gorman Laughter

## 2023-08-20 ENCOUNTER — Emergency Department (HOSPITAL_COMMUNITY)

## 2023-08-20 ENCOUNTER — Inpatient Hospital Stay (HOSPITAL_COMMUNITY)
Admission: EM | Admit: 2023-08-20 | Discharge: 2023-09-03 | DRG: 853 | Disposition: A | Discharge status: Skilled Nursing Facility | Source: Skilled Nursing Facility | Attending: Internal Medicine | Admitting: Internal Medicine

## 2023-08-20 DIAGNOSIS — K5900 Constipation, unspecified: Secondary | ICD-10-CM | POA: Diagnosis not present

## 2023-08-20 DIAGNOSIS — Z882 Allergy status to sulfonamides status: Secondary | ICD-10-CM

## 2023-08-20 DIAGNOSIS — I2510 Atherosclerotic heart disease of native coronary artery without angina pectoris: Secondary | ICD-10-CM | POA: Diagnosis present

## 2023-08-20 DIAGNOSIS — Z8249 Family history of ischemic heart disease and other diseases of the circulatory system: Secondary | ICD-10-CM

## 2023-08-20 DIAGNOSIS — F411 Generalized anxiety disorder: Secondary | ICD-10-CM | POA: Diagnosis present

## 2023-08-20 DIAGNOSIS — R319 Hematuria, unspecified: Secondary | ICD-10-CM | POA: Insufficient documentation

## 2023-08-20 DIAGNOSIS — E1165 Type 2 diabetes mellitus with hyperglycemia: Secondary | ICD-10-CM | POA: Diagnosis present

## 2023-08-20 DIAGNOSIS — D696 Thrombocytopenia, unspecified: Secondary | ICD-10-CM

## 2023-08-20 DIAGNOSIS — Z79890 Hormone replacement therapy (postmenopausal): Secondary | ICD-10-CM

## 2023-08-20 DIAGNOSIS — N200 Calculus of kidney: Secondary | ICD-10-CM

## 2023-08-20 DIAGNOSIS — E66812 Obesity, class 2: Secondary | ICD-10-CM | POA: Diagnosis present

## 2023-08-20 DIAGNOSIS — Z981 Arthrodesis status: Secondary | ICD-10-CM

## 2023-08-20 DIAGNOSIS — E875 Hyperkalemia: Secondary | ICD-10-CM

## 2023-08-20 DIAGNOSIS — F17220 Nicotine dependence, chewing tobacco, uncomplicated: Secondary | ICD-10-CM | POA: Diagnosis present

## 2023-08-20 DIAGNOSIS — Z87442 Personal history of urinary calculi: Secondary | ICD-10-CM

## 2023-08-20 DIAGNOSIS — E538 Deficiency of other specified B group vitamins: Secondary | ICD-10-CM | POA: Diagnosis present

## 2023-08-20 DIAGNOSIS — N151 Renal and perinephric abscess: Secondary | ICD-10-CM | POA: Diagnosis not present

## 2023-08-20 DIAGNOSIS — Z960 Presence of urogenital implants: Secondary | ICD-10-CM

## 2023-08-20 DIAGNOSIS — I959 Hypotension, unspecified: Secondary | ICD-10-CM | POA: Diagnosis present

## 2023-08-20 DIAGNOSIS — Z87898 Personal history of other specified conditions: Secondary | ICD-10-CM

## 2023-08-20 DIAGNOSIS — E872 Acidosis, unspecified: Secondary | ICD-10-CM | POA: Diagnosis present

## 2023-08-20 DIAGNOSIS — A419 Sepsis, unspecified organism: Secondary | ICD-10-CM | POA: Diagnosis present

## 2023-08-20 DIAGNOSIS — E039 Hypothyroidism, unspecified: Secondary | ICD-10-CM | POA: Diagnosis present

## 2023-08-20 DIAGNOSIS — R6520 Severe sepsis without septic shock: Secondary | ICD-10-CM | POA: Diagnosis present

## 2023-08-20 DIAGNOSIS — Z8679 Personal history of other diseases of the circulatory system: Secondary | ICD-10-CM

## 2023-08-20 DIAGNOSIS — Z955 Presence of coronary angioplasty implant and graft: Secondary | ICD-10-CM

## 2023-08-20 DIAGNOSIS — Z7984 Long term (current) use of oral hypoglycemic drugs: Secondary | ICD-10-CM

## 2023-08-20 DIAGNOSIS — E871 Hypo-osmolality and hyponatremia: Secondary | ICD-10-CM

## 2023-08-20 DIAGNOSIS — I480 Paroxysmal atrial fibrillation: Secondary | ICD-10-CM | POA: Insufficient documentation

## 2023-08-20 DIAGNOSIS — Z794 Long term (current) use of insulin: Secondary | ICD-10-CM

## 2023-08-20 DIAGNOSIS — G9341 Metabolic encephalopathy: Secondary | ICD-10-CM

## 2023-08-20 DIAGNOSIS — A4151 Sepsis due to Escherichia coli [E. coli]: Principal | ICD-10-CM | POA: Diagnosis present

## 2023-08-20 DIAGNOSIS — I10 Essential (primary) hypertension: Secondary | ICD-10-CM | POA: Diagnosis present

## 2023-08-20 DIAGNOSIS — E785 Hyperlipidemia, unspecified: Secondary | ICD-10-CM | POA: Diagnosis present

## 2023-08-20 DIAGNOSIS — E119 Type 2 diabetes mellitus without complications: Secondary | ICD-10-CM

## 2023-08-20 DIAGNOSIS — K219 Gastro-esophageal reflux disease without esophagitis: Secondary | ICD-10-CM | POA: Diagnosis present

## 2023-08-20 DIAGNOSIS — Z791 Long term (current) use of non-steroidal anti-inflammatories (NSAID): Secondary | ICD-10-CM

## 2023-08-20 DIAGNOSIS — N202 Calculus of kidney with calculus of ureter: Secondary | ICD-10-CM | POA: Diagnosis present

## 2023-08-20 DIAGNOSIS — N400 Enlarged prostate without lower urinary tract symptoms: Secondary | ICD-10-CM | POA: Diagnosis present

## 2023-08-20 DIAGNOSIS — N12 Tubulo-interstitial nephritis, not specified as acute or chronic: Secondary | ICD-10-CM | POA: Diagnosis present

## 2023-08-20 DIAGNOSIS — N179 Acute kidney failure, unspecified: Secondary | ICD-10-CM | POA: Diagnosis present

## 2023-08-20 DIAGNOSIS — N390 Urinary tract infection, site not specified: Principal | ICD-10-CM | POA: Diagnosis present

## 2023-08-20 DIAGNOSIS — Z7982 Long term (current) use of aspirin: Secondary | ICD-10-CM

## 2023-08-20 DIAGNOSIS — F32A Depression, unspecified: Secondary | ICD-10-CM | POA: Diagnosis present

## 2023-08-20 DIAGNOSIS — Z7902 Long term (current) use of antithrombotics/antiplatelets: Secondary | ICD-10-CM

## 2023-08-20 DIAGNOSIS — Z79899 Other long term (current) drug therapy: Secondary | ICD-10-CM

## 2023-08-20 DIAGNOSIS — Z6839 Body mass index (BMI) 39.0-39.9, adult: Secondary | ICD-10-CM

## 2023-08-20 DIAGNOSIS — L89896 Pressure-induced deep tissue damage of other site: Secondary | ICD-10-CM | POA: Diagnosis not present

## 2023-08-20 LAB — CBC WITH DIFFERENTIAL/PLATELET
Abs Immature Granulocytes: 0.07 10*3/uL (ref 0.00–0.07)
Basophils Absolute: 0.1 10*3/uL (ref 0.0–0.1)
Basophils Relative: 1 %
Eosinophils Absolute: 0.1 10*3/uL (ref 0.0–0.5)
Eosinophils Relative: 1 %
HCT: 36.2 % — ABNORMAL LOW (ref 39.0–52.0)
Hemoglobin: 11.9 g/dL — ABNORMAL LOW (ref 13.0–17.0)
Immature Granulocytes: 1 %
Lymphocytes Relative: 5 %
Lymphs Abs: 0.5 10*3/uL — ABNORMAL LOW (ref 0.7–4.0)
MCH: 31.3 pg (ref 26.0–34.0)
MCHC: 32.9 g/dL (ref 30.0–36.0)
MCV: 95.3 fL (ref 80.0–100.0)
Monocytes Absolute: 0.4 10*3/uL (ref 0.1–1.0)
Monocytes Relative: 3 %
Neutro Abs: 10 10*3/uL — ABNORMAL HIGH (ref 1.7–7.7)
Neutrophils Relative %: 89 %
Platelets: 89 10*3/uL — ABNORMAL LOW (ref 150–400)
RBC: 3.8 MIL/uL — ABNORMAL LOW (ref 4.22–5.81)
RDW: 13.5 % (ref 11.5–15.5)
WBC: 11.1 10*3/uL — ABNORMAL HIGH (ref 4.0–10.5)
nRBC: 0 % (ref 0.0–0.2)

## 2023-08-20 LAB — COMPREHENSIVE METABOLIC PANEL WITH GFR
ALT: 21 U/L (ref 0–44)
AST: 28 U/L (ref 15–41)
Albumin: 2.8 g/dL — ABNORMAL LOW (ref 3.5–5.0)
Alkaline Phosphatase: 63 U/L (ref 38–126)
Anion gap: 14 (ref 5–15)
BUN: 55 mg/dL — ABNORMAL HIGH (ref 8–23)
CO2: 18 mmol/L — ABNORMAL LOW (ref 22–32)
Calcium: 8.5 mg/dL — ABNORMAL LOW (ref 8.9–10.3)
Chloride: 97 mmol/L — ABNORMAL LOW (ref 98–111)
Creatinine, Ser: 3.17 mg/dL — ABNORMAL HIGH (ref 0.61–1.24)
GFR, Estimated: 20 mL/min — ABNORMAL LOW (ref >60)
Glucose, Bld: 107 mg/dL — ABNORMAL HIGH (ref 70–99)
Potassium: 5.5 mmol/L — ABNORMAL HIGH (ref 3.5–5.1)
Sodium: 129 mmol/L — ABNORMAL LOW (ref 135–145)
Total Bilirubin: 0.7 mg/dL (ref 0.0–1.2)
Total Protein: 5.9 g/dL — ABNORMAL LOW (ref 6.5–8.1)

## 2023-08-20 LAB — URINALYSIS, W/ REFLEX TO CULTURE (INFECTION SUSPECTED)
Bilirubin Urine: NEGATIVE
Glucose, UA: NEGATIVE mg/dL
Ketones, ur: NEGATIVE mg/dL
Nitrite: NEGATIVE
Protein, ur: 100 mg/dL — AB
RBC / HPF: >50 RBC/hpf (ref 0–5)
Specific Gravity, Urine: 1.021 (ref 1.005–1.030)
WBC, UA: >50 WBC/hpf (ref 0–5)
pH: 5 (ref 5.0–8.0)

## 2023-08-20 LAB — I-STAT CG4 LACTIC ACID, ED: Lactic Acid, Venous: 4.4 mmol/L (ref 0.5–1.9)

## 2023-08-20 LAB — CBG MONITORING, ED: Glucose-Capillary: 101 mg/dL — ABNORMAL HIGH (ref 70–99)

## 2023-08-20 LAB — PROTIME-INR
INR: 1.2 (ref 0.8–1.2)
Prothrombin Time: 15 s (ref 11.4–15.2)

## 2023-08-20 MED ORDER — LACTATED RINGERS IV BOLUS
1000.0000 mL | Freq: Once | INTRAVENOUS | Status: AC
  Administered 2023-08-20: 1000 mL via INTRAVENOUS

## 2023-08-20 MED ORDER — NALOXONE HCL 0.4 MG/ML IJ SOLN
0.4000 mg | Freq: Once | INTRAMUSCULAR | Status: AC
  Administered 2023-08-20: 0.4 mg via INTRAVENOUS

## 2023-08-20 MED ORDER — NALOXONE HCL 0.4 MG/ML IJ SOLN
INTRAMUSCULAR | Status: AC
  Filled 2023-08-20: qty 1

## 2023-08-20 MED ORDER — METRONIDAZOLE 500 MG/100ML IV SOLN
500.0000 mg | Freq: Two times a day (BID) | INTRAVENOUS | Status: DC
  Filled 2023-08-20: qty 100

## 2023-08-20 MED ORDER — SODIUM CHLORIDE 0.9 % IV SOLN
1.0000 g | Freq: Once | INTRAVENOUS | Status: AC
  Administered 2023-08-20: 1 g via INTRAVENOUS
  Filled 2023-08-20: qty 10

## 2023-08-20 MED ORDER — SODIUM CHLORIDE 0.9 % IV BOLUS: 1000.0000 mL | Freq: Once | INTRAVENOUS | Status: DC

## 2023-08-20 NOTE — Progress Notes (Signed)
 CT reviewed - full consult to follow. Discussed with Dr. Tamela Fake. Stents in good position. Patient simply needs a foley catheter placed to max drain the system and medical resuscitation. Foley can be placed into the bladder adjacent to the stent tether/strings without issue.

## 2023-08-20 NOTE — ED Notes (Signed)
 Notified PA Rice Chamorro patient's I-Stat Lactic Acid CG4 was 4.42.

## 2023-08-20 NOTE — ED Provider Notes (Signed)
 [place] EMERGENCY DEPARTMENT AT [facility] Provider Note   CSN: [id number] Arrival date & time: 08/20/23  2128     History  Chief Complaint  Patient presents with   Post-op Problem    [name] is a 78 y.o. male.  HPI        78 year old male with a history of coronary artery disease, type 2 diabetes, hypertension, hypothyroidism, hyperlipidemia, recent left ureteroscopy with laser lithotripsy, stent placement with Dr. Jarvis Mesa of urology on May 6, previous admission for UTI with bilateral ureteral stones with stent placement March 31, who presents with concern for abnormal labs and hypotension from [facility] facility.  Patient reports some left upper leg pain, abdominal pain.  He is sleepy on arrival, given narcan  and appears somewhat more awake. He is hard of hearing.  Denies (and facility confirms) nausea, vomiting, diarrhea, black or bloody stools, known fevers.  He reports he has had some mild cough.  Denies chest pain or shortness of breath.  Facility reports he has been using his pain medications, which appear to be tramadol  on his MAR.  He reports he is not sure why they sent him here. He does know he is at [facility].  Per facility, they checked labs today which found his chemistries to be out of whack and then found him to have low blood pressure and sent him to the ED.   Past Medical History:  Diagnosis Date   Abnormal stress test    Anxiety    Arthritis    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HCC) 03/14/2013   Atypical chest pain 12/28/2013   CAD in native artery 03/14/2013   Cervical disc disorder at C5-C6 level with radiculopathy 06/09/2022   Cervical radiculopathy at C5 06/10/2022   Chest pain 07/30/2011   1999 Cath - <50% stenosis 2008 NUC - low risk    Chronic back pain    herniated disc   Coronary artery disease    takes Plavix  and ASA daily   Depression    takes Prozac  daily   Diabetes mellitus  type 2, controlled, with complications (HCC) 07/30/2011   Displacement of lumbar intervertebral disc without myelopathy 05/30/2014   Diverticulitis    hx of   Dizziness    occasionally and states Dr.Smith is aware   DJD (degenerative joint disease)    Essential hypertension, benign 07/30/2011   Fatigue 09/06/2019   GERD (gastroesophageal reflux disease)    takes an OTC antacid   Gout    yrs ago and doesn't take any meds   Hard of hearing    wears hearing aids   Headache(784.0)    occasionally   History of colon polyps    benign   History of fusion of cervical spine 09/04/2020   Last Assessment & Plan:   Formatting of this note might be different from the original.  Review of recent cervical spine CT on care everywhere notes ACDF from C5-7 without hardware complication.  At this time he is doing well without any symptomatic issues.   History of kidney stones    HNP (herniated nucleus pulposus), lumbar 06/14/2014   Hyperlipidemia    takes Atorvastatin  daily   Hypertension    takes Amlodipine ,Losartan ,and Coreg  daily   Hypothyroidism    takes Synthroid  daily   IBS (irritable bowel syndrome)    Lumbar post-laminectomy syndrome 09/04/2020   Last Assessment & Plan:   Formatting of this note might be different from the  original.  78 year old male with chronic low back pain history of multiple lumbar spine surgeries, he has had a multilevel decompression and is fused from L4 to the sacrum.  His lumbar MRI completed in 2017 shows improvement compared with prior status post left L3-4 diskectomy in March of 2016 there is mild residual mass   Nontraumatic complete tear of right rotator cuff 06/09/2022   Obesity, unspecified 07/30/2011   Other spondylosis, lumbar region 02/12/2021   Pain medication agreement 09/04/2020   Last Assessment & Plan:   Formatting of this note might be different from the original.  Agreement up-to-date.  Patient and I have discussed the hazardous effects of continued  opiate pain medication usage. Risks and benefits of above medications including but not limited to possibility of respiratory depression, sedation, and even death were discussed with the patient who expressed an understandin   PAT (paroxysmal atrial tachycardia) (HCC) 10/12/2020   Preoperative cardiovascular examination 09/06/2019   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   Right sided weakness 06/04/2022   Shortness of breath dyspnea    with exertion   Swelling of lower limb 06/20/2014   Tuberculosis    Patient denies Tb   Type II diabetes mellitus (HCC)    takes Metformin  daily   Unstable angina (HCC) 12/27/2013   Weakness    numbness and tingling in left leg     Home Medications Prior to Admission medications   Medication Sig Start Date End Date Taking? Authorizing Provider  acetaminophen  (TYLENOL ) 500 MG tablet Take 1,000 mg by mouth in the morning, at noon, and at bedtime.   Yes [provider]  amLODipine  (NORVASC ) 5 MG tablet Take 1 tablet (5 mg total) by mouth daily. 08/08/22  Yes Dahal, Aminta Baldy, MD  aspirin  81 MG tablet Take 81 mg by mouth daily.   Yes [provider]  atorvastatin  (LIPITOR ) 80 MG tablet Take 80 mg by mouth daily. 11/01/21  Yes [provider]  buPROPion  (WELLBUTRIN  XL) 150 MG 24 hr tablet Take 150 mg by mouth daily.   Yes [provider]  carvedilol  (COREG ) 6.25 MG tablet Take 6.25 mg by mouth 2 (two) times daily with a meal.   Yes [provider]  Cholecalciferol  25 MCG (1000 UT) tablet Take 1,000 Units by mouth daily.   Yes [provider]  clopidogrel  (PLAVIX ) 75 MG tablet Take 75 mg by mouth daily with breakfast.   Yes [provider]  Cranberry 450 MG TABS Take 1 tablet by mouth daily.   Yes [provider]  diclofenac  Sodium (VOLTAREN ) 1 % GEL Apply 2 g topically every 12 (twelve) hours as needed.   Yes [provider]  docusate sodium  (COLACE) 100 MG capsule Take  1 capsule (100 mg total) by mouth 2 (two) times daily as needed for mild constipation. 08/07/22  Yes Dahal, Aminta Baldy, MD  hydroxypropyl methylcellulose / hypromellose (ISOPTO TEARS / GONIOVISC) 2.5 % ophthalmic solution Place 2 drops into both eyes every 4 (four) hours as needed for dry eyes.   Yes [provider]  hyoscyamine  (LEVSIN/SL) 0.125 MG SL tablet Place 1 tablet (0.125 mg total) under the tongue every 6 (six) hours as needed (bladder spasms). 08/18/23  Yes Mallie Seal, MD  insulin  lispro (HUMALOG) 100 UNIT/ML injection Inject 0-9 Units into the skin in the morning and at bedtime. Sliding Scale: 0-150: 0 151-200: 2 201-250: 3 251-300: 5 301-350: 7 351-400: 9   Yes [provider]  levofloxacin (LEVAQUIN) 250 MG  tablet Take 250 mg by mouth 3 (three) times a week.   Yes [provider]  levothyroxine  (SYNTHROID , LEVOTHROID) 50 MCG tablet Take 50 mcg by mouth daily before breakfast. 02/26/15  Yes [provider]  loratadine (CLARITIN) 10 MG tablet Take 10 mg by mouth daily.   Yes [provider]  LORazepam  (ATIVAN ) 1 MG tablet Take 1 tablet (1 mg total) by mouth every 12 (twelve) hours as needed for anxiety. Patient taking differently: Take 1 mg by mouth at bedtime. 07/17/23 07/16/24 Yes Pokhrel, Laxman, MD  losartan  (COZAAR ) 50 MG tablet Take 50 mg by mouth daily.   Yes [provider]  meloxicam  (MOBIC ) 15 MG tablet Take 1 tablet (15 mg total) by mouth daily for 10 days. 08/18/23 08/28/23 Yes Mallie Seal, MD  metFORMIN  (GLUCOPHAGE ) 1000 MG tablet Take 1,000 mg by mouth 2 (two) times daily with a meal.   Yes [provider]  nitrofurantoin , macrocrystal-monohydrate, (MACROBID ) 100 MG capsule Take 1 capsule (100 mg total) by mouth 2 (two) times daily for 7 days. 08/18/23 08/25/23 Yes Mallie Seal, MD  nitroGLYCERIN  (NITROSTAT ) 0.4 MG SL tablet Place 0.4 mg under the tongue every 5 (five) minutes as needed for chest pain.   Yes  [provider]  ondansetron  (ZOFRAN ) 8 MG tablet Take 8 mg by mouth every 6 (six) hours as needed for nausea or vomiting.   Yes [provider]  oxyCODONE  (OXY IR/ROXICODONE ) 5 MG immediate release tablet Take 5 mg by mouth every 6 (six) hours as needed for severe pain (pain score 7-10).   Yes [provider]  polyethylene glycol powder (GLYCOLAX /MIRALAX ) 17 GM/SCOOP powder Take 17 g by mouth in the morning.   Yes [provider]  saccharomyces boulardii (FLORASTOR) 250 MG capsule Take 250 mg by mouth daily.   Yes [provider]  sennosides-docusate sodium  (SENOKOT-S) 8.6-50 MG tablet Take 1 tablet by mouth in the morning and at bedtime.   Yes [provider]  Sertraline  HCl 150 MG CAPS Take 150 mg by mouth daily.   Yes [provider]  shark liver oil-cocoa butter (PREPARATION H) 0.25-88.44 % suppository Place 1 suppository rectally every 12 (twelve) hours as needed for hemorrhoids.   Yes [provider]  simethicone (MYLICON) 125 MG chewable tablet Chew 125 mg by mouth every 6 (six) hours as needed for flatulence.   Yes [provider]  tamsulosin (FLOMAX) 0.4 MG CAPS capsule Take 0.4 mg by mouth daily.   Yes [provider]  traMADol  (ULTRAM ) 50 MG tablet Take 1 tablet (50 mg total) by mouth every 8 (eight) hours as needed for moderate pain (pain score 4-6) or severe pain (pain score 7-10). Patient taking differently: Take 100 mg by mouth every 8 (eight) hours as needed for moderate pain (pain score 4-6) or severe pain (pain score 7-10). 08/18/23 08/17/24 Yes Mallie Seal, MD  traZODone (DESYREL) 100 MG tablet Take 100 mg by mouth at bedtime.   Yes [provider]      Allergies    Trimethoprim, Misc. sulfonamide containing compounds, and Sulfa antibiotics    Review of Systems   Review of Systems  Physical Exam Updated Vital Signs BP 108/61   Pulse 93   Temp 97.8 F (36.6 C) (Oral)    Resp (!) 22   SpO2 94%  Physical Exam Vitals and nursing note reviewed.  Constitutional:      General: He is not in acute distress.  Appearance: He is well-developed. He is ill-appearing. He is not diaphoretic.     Comments: Sleepy but answers questions appropriately  HENT:     Head: Normocephalic and atraumatic.  Eyes:     Conjunctiva/sclera: Conjunctivae normal.  Cardiovascular:     Rate and Rhythm: Normal rate and regular rhythm.     Heart sounds: Normal heart sounds. No murmur heard.    No friction rub. No gallop.  Pulmonary:     Effort: Pulmonary effort is normal. No respiratory distress.     Breath sounds: Normal breath sounds. No wheezing or rales.  Abdominal:     General: There is no distension.     Palpations: Abdomen is soft.     Tenderness: There is abdominal tenderness. There is no guarding.  Musculoskeletal:     Cervical back: Normal range of motion.  Skin:    General: Skin is warm and dry.  Neurological:     Mental Status: He is oriented to person, place, and time.     ED Results / Procedures / Treatments   Labs (all labs ordered are listed, but only abnormal results are displayed) Labs Reviewed  COMPREHENSIVE METABOLIC PANEL WITH GFR - Abnormal; Notable for the following components:      Result Value   Sodium 129 (*)    Potassium 5.5 (*)    Chloride 97 (*)    CO2 18 (*)    Glucose, Bld 107 (*)    BUN 55 (*)    Creatinine, Ser 3.17 (*)    Calcium  8.5 (*)    Total Protein 5.9 (*)    Albumin 2.8 (*)    GFR, Estimated 20 (*)    All other components within normal limits  CBC WITH DIFFERENTIAL/PLATELET - Abnormal; Notable for the following components:   WBC 11.1 (*)    RBC 3.80 (*)    Hemoglobin 11.9 (*)    HCT 36.2 (*)    Platelets 89 (*)    Neutro Abs 10.0 (*)    Lymphs Abs 0.5 (*)    All other components within normal limits  URINALYSIS, W/ REFLEX TO CULTURE (INFECTION SUSPECTED) - Abnormal; Notable for the following components:   Color, Urine  BROWN (*)    APPearance TURBID (*)    Hgb urine dipstick LARGE (*)    Protein, ur 100 (*)    Leukocytes,Ua MODERATE (*)    Bacteria, UA MANY (*)    All other components within normal limits  I-STAT CG4 LACTIC ACID, ED - Abnormal; Notable for the following components:   Lactic Acid, Venous 4.4 (*)    All other components within normal limits  CBG MONITORING, ED - Abnormal; Notable for the following components:   Glucose-Capillary 101 (*)    All other components within normal limits  I-STAT CG4 LACTIC ACID, ED - Abnormal; Notable for the following components:   Lactic Acid, Venous 4.7 (*)    All other components within normal limits  CULTURE, BLOOD (ROUTINE X 2)  CULTURE, BLOOD (ROUTINE X 2)  URINE CULTURE  PROTIME-INR  LACTIC ACID, PLASMA  LACTIC ACID, PLASMA  BLOOD GAS, ARTERIAL  DIC (DISSEMINATED INTRAVASCULAR COAGULATION)PANEL  I-STAT CG4 LACTIC ACID, ED    EKG EKG Interpretation Date/Time:  Thursday Aug 20 2023 21:52:56 EDT Ventricular Rate:  95 PR Interval:  173 QRS Duration:  95 QT Interval:  357 QTC Calculation: 449 R Axis:   -19  Text Interpretation: Sinus rhythm Borderline left axis deviation Low voltage, precordial leads No significant  change since last tracing Confirmed by Scarlette Currier ([id number]) on 08/20/2023 10:18:53 PM  Radiology CT Renal Stone Study Result Date: 08/20/2023 CLINICAL DATA:  Bilateral flank pain, hematuria. Recent lithotripsy. EXAM: CT ABDOMEN AND PELVIS WITHOUT CONTRAST TECHNIQUE: Multidetector CT imaging of the abdomen and pelvis was performed following the standard protocol without IV contrast. RADIATION DOSE REDUCTION: This exam was performed according to the departmental dose-optimization program which includes automated exposure control, adjustment of the mA and/or kV according to patient size and/or use of iterative reconstruction technique. COMPARISON:  07/13/2023 FINDINGS: Lower chest: Bibasilar atelectasis. No effusions. Coronary artery and  aortic atherosclerosis. Hepatobiliary: No focal liver abnormality is seen. Status post cholecystectomy. No biliary dilatation. Pancreas: No focal abnormality or ductal dilatation. Spleen: No focal abnormality.  Normal size. Adrenals/Urinary Tract: Adrenal glands normal. Bilateral ureteral stents are in place. Air within the renal collecting systems bilaterally and bladder. Probable blood clot in the right renal pelvis. Previously seen ureteral stones no longer visualized. There is also air noted along the periphery of the left kidney midpole of unknown etiology. No focal fluid collection in the region to suggest abscess. Stomach/Bowel: Normal appendix. Scattered colonic diverticulosis, most pronounced in the sigmoid colon. No active diverticulitis. Stomach and small bowel decompressed. Vascular/Lymphatic: Aortic atherosclerosis. No evidence of aneurysm or adenopathy. Reproductive: No visible focal abnormality. Other: No free fluid or free air. Musculoskeletal: No acute bony abnormality. Postoperative and degenerative changes in the lumbar spine. IMPRESSION: Interval placement of bilateral ureteral stents. Previously seen proximal ureteral stones no longer visualized. Air noted within the collecting systems and bladder likely related to recent stent placement. Probable blood clot within the right renal pelvis. No hydronephrosis. Air also noted along the surface of the left kidney at the midpole of unknown etiology. No fluid collection to suggest abscess. Aortic atherosclerosis. Colonic diverticulosis. Electronically Signed   By: Janeece Mechanic M.D.   On: 08/20/2023 22:58   DG Chest Port 1 View Result Date: 08/20/2023 CLINICAL DATA:  Sepsis EXAM: PORTABLE CHEST 1 VIEW COMPARISON:  Chest x-ray 08/01/2022 FINDINGS: The heart size and mediastinal contours are within normal limits. Both lungs are clear. No acute fractures are seen. Cervical spinal fusion plate is present. IMPRESSION: No active disease. Electronically  Signed   By: Tyron Gallon M.D.   On: 08/20/2023 22:03    Procedures Procedures    Medications Ordered in ED Medications  insulin  aspart (novoLOG ) injection 5 Units (has no administration in time range)  dextrose  50 % solution 50 mL (has no administration in time range)  sodium zirconium cyclosilicate (LOKELMA) packet 10 g (has no administration in time range)  naloxone  (NARCAN ) injection 0.4 mg (0.4 mg Intravenous Given 08/20/23 2150)  lactated ringers  bolus 1,000 mL (0 mLs Intravenous Stopped 08/20/23 2334)  lactated ringers  bolus 1,000 mL (0 mLs Intravenous Stopped 08/20/23 2334)  cefTRIAXone  (ROCEPHIN ) 1 g in sodium chloride  0.9 % 100 mL IVPB (0 g Intravenous Stopped 08/20/23 2334)  lactated ringers  bolus 1,000 mL (1,000 mLs Intravenous New Bag/Given 08/20/23 2252)    ED Course/ Medical Decision Making/ A&P                                    78 year old male with a history of coronary artery disease, type 2 diabetes, hypertension, hypothyroidism, hyperlipidemia, recent left ureteroscopy with laser lithotripsy, stent placement with Dr. Jarvis Mesa of urology on May 6, previous admission for UTI with bilateral ureteral stones  with stent placement March 31, who presents with concern for abnormal labs and hypotension from [facility] facility.  On arrival to the emergency department, he was found to have blood pressures in the 70s and 80s systolic, appears sleepy.  He was given 0.4 mg of Narcan  and did appear to be more alert, and blood pressures improved to 120 systolic.  Differential diagnosis includes sepsis, hypotension secondary to medications, electrolyte abnormalities, anemia.  He is given 3 L of LR and rocephin  with concern for sepsis secondary to UTI when lactic acid return 4.4.  Labs completed and personally reviewed interpreted by me show urinalysis concerning for UTI with many bacteria, clumps of white blood cells with greater than 50 white blood cells.  Lactic acid is 4.4.  CMP  shows a sodium of 129, potassium of 5.5, bicarb of 18, BUN of 55 and a creatinine of 3.17 from a previous creatinine of 0.74.  He does have a mild leukocytosis of 11,000.  Chest x-ray was obtained personally evaluated interpreted by me showed no evidence of pneumonia.  CT stone study was completed and showed bilateral ureteral stents which are in place, with no remaining stones, probable blood clot within the right renal pelvis with no hydronephrosis-air along the surface of the left kidney of unknown etiology no fluid to suggest abscess.  Discussed with Dr. Derrick Fling of urology, and team will come evaluate him in the morning.  He recommends Foley catheter placement.  Blood pressures have remained stable at this time following fluid and IV antibiotics.  Will plan admitting to the hospital service for further care with concern for likely sepsis secondary to urinary source with possible contributor of medication related hypotension and encephalopathy.   Repeat lactic acid is 4.7-is mildly tachypneic on reevaluation but saturations remain normal and blood pressure is stabilized. Do not detect new crackles on exam. Will recheck from straight stick, ABG added, will continue to closely monitor.         Final Clinical Impression(s) / ED Diagnoses Final diagnoses:  Urinary tract infection without hematuria, site unspecified  Sepsis, due to unspecified organism, unspecified whether acute organ dysfunction present (HCC)  Lactic acidosis  AKI (acute kidney injury) (HCC)  Polypharmacy    Rx / DC Orders ED Discharge Orders     None         Scarlette Currier, MD 08/21/23 0031

## 2023-08-20 NOTE — ED Triage Notes (Signed)
 Patient arrived with complaints of bilateral flank pain and hematuria. Patient had a lithotripsy two days ago but with continued pain. Facility noted elevated BUN and WBC and concerned so sent for evaluation.

## 2023-08-21 ENCOUNTER — Other Ambulatory Visit: Payer: Self-pay

## 2023-08-21 ENCOUNTER — Encounter (HOSPITAL_COMMUNITY): Payer: Self-pay | Admitting: Internal Medicine

## 2023-08-21 DIAGNOSIS — E871 Hypo-osmolality and hyponatremia: Secondary | ICD-10-CM | POA: Diagnosis present

## 2023-08-21 DIAGNOSIS — Z743 Need for continuous supervision: Secondary | ICD-10-CM | POA: Diagnosis not present

## 2023-08-21 DIAGNOSIS — A419 Sepsis, unspecified organism: Secondary | ICD-10-CM | POA: Diagnosis present

## 2023-08-21 DIAGNOSIS — R7881 Bacteremia: Secondary | ICD-10-CM | POA: Diagnosis not present

## 2023-08-21 DIAGNOSIS — B9620 Unspecified Escherichia coli [E. coli] as the cause of diseases classified elsewhere: Secondary | ICD-10-CM | POA: Diagnosis not present

## 2023-08-21 DIAGNOSIS — Z87898 Personal history of other specified conditions: Secondary | ICD-10-CM

## 2023-08-21 DIAGNOSIS — Z79899 Other long term (current) drug therapy: Secondary | ICD-10-CM | POA: Diagnosis not present

## 2023-08-21 DIAGNOSIS — E1165 Type 2 diabetes mellitus with hyperglycemia: Secondary | ICD-10-CM | POA: Diagnosis present

## 2023-08-21 DIAGNOSIS — E7849 Other hyperlipidemia: Secondary | ICD-10-CM | POA: Diagnosis not present

## 2023-08-21 DIAGNOSIS — Z8679 Personal history of other diseases of the circulatory system: Secondary | ICD-10-CM | POA: Diagnosis not present

## 2023-08-21 DIAGNOSIS — G8929 Other chronic pain: Secondary | ICD-10-CM | POA: Diagnosis not present

## 2023-08-21 DIAGNOSIS — K5730 Diverticulosis of large intestine without perforation or abscess without bleeding: Secondary | ICD-10-CM | POA: Diagnosis not present

## 2023-08-21 DIAGNOSIS — Z981 Arthrodesis status: Secondary | ICD-10-CM | POA: Diagnosis not present

## 2023-08-21 DIAGNOSIS — F32A Depression, unspecified: Secondary | ICD-10-CM | POA: Diagnosis present

## 2023-08-21 DIAGNOSIS — Z7401 Bed confinement status: Secondary | ICD-10-CM | POA: Diagnosis not present

## 2023-08-21 DIAGNOSIS — R935 Abnormal findings on diagnostic imaging of other abdominal regions, including retroperitoneum: Secondary | ICD-10-CM | POA: Diagnosis not present

## 2023-08-21 DIAGNOSIS — R310 Gross hematuria: Secondary | ICD-10-CM | POA: Diagnosis not present

## 2023-08-21 DIAGNOSIS — R319 Hematuria, unspecified: Secondary | ICD-10-CM | POA: Insufficient documentation

## 2023-08-21 DIAGNOSIS — D696 Thrombocytopenia, unspecified: Secondary | ICD-10-CM

## 2023-08-21 DIAGNOSIS — E039 Hypothyroidism, unspecified: Secondary | ICD-10-CM | POA: Diagnosis present

## 2023-08-21 DIAGNOSIS — Z960 Presence of urogenital implants: Secondary | ICD-10-CM

## 2023-08-21 DIAGNOSIS — E538 Deficiency of other specified B group vitamins: Secondary | ICD-10-CM | POA: Diagnosis present

## 2023-08-21 DIAGNOSIS — N136 Pyonephrosis: Secondary | ICD-10-CM | POA: Diagnosis not present

## 2023-08-21 DIAGNOSIS — F411 Generalized anxiety disorder: Secondary | ICD-10-CM | POA: Diagnosis present

## 2023-08-21 DIAGNOSIS — I480 Paroxysmal atrial fibrillation: Secondary | ICD-10-CM | POA: Diagnosis present

## 2023-08-21 DIAGNOSIS — Z794 Long term (current) use of insulin: Secondary | ICD-10-CM | POA: Diagnosis not present

## 2023-08-21 DIAGNOSIS — M545 Low back pain, unspecified: Secondary | ICD-10-CM | POA: Diagnosis not present

## 2023-08-21 DIAGNOSIS — N390 Urinary tract infection, site not specified: Secondary | ICD-10-CM | POA: Diagnosis not present

## 2023-08-21 DIAGNOSIS — E118 Type 2 diabetes mellitus with unspecified complications: Secondary | ICD-10-CM | POA: Diagnosis not present

## 2023-08-21 DIAGNOSIS — N151 Renal and perinephric abscess: Secondary | ICD-10-CM | POA: Diagnosis not present

## 2023-08-21 DIAGNOSIS — E038 Other specified hypothyroidism: Secondary | ICD-10-CM

## 2023-08-21 DIAGNOSIS — K5900 Constipation, unspecified: Secondary | ICD-10-CM | POA: Diagnosis not present

## 2023-08-21 DIAGNOSIS — A4151 Sepsis due to Escherichia coli [E. coli]: Secondary | ICD-10-CM | POA: Diagnosis present

## 2023-08-21 DIAGNOSIS — E119 Type 2 diabetes mellitus without complications: Secondary | ICD-10-CM

## 2023-08-21 DIAGNOSIS — E785 Hyperlipidemia, unspecified: Secondary | ICD-10-CM | POA: Diagnosis present

## 2023-08-21 DIAGNOSIS — L89896 Pressure-induced deep tissue damage of other site: Secondary | ICD-10-CM | POA: Diagnosis not present

## 2023-08-21 DIAGNOSIS — G9341 Metabolic encephalopathy: Secondary | ICD-10-CM

## 2023-08-21 DIAGNOSIS — Z6839 Body mass index (BMI) 39.0-39.9, adult: Secondary | ICD-10-CM | POA: Diagnosis not present

## 2023-08-21 DIAGNOSIS — I2510 Atherosclerotic heart disease of native coronary artery without angina pectoris: Secondary | ICD-10-CM | POA: Diagnosis not present

## 2023-08-21 DIAGNOSIS — D72829 Elevated white blood cell count, unspecified: Secondary | ICD-10-CM | POA: Diagnosis not present

## 2023-08-21 DIAGNOSIS — K4090 Unilateral inguinal hernia, without obstruction or gangrene, not specified as recurrent: Secondary | ICD-10-CM | POA: Diagnosis not present

## 2023-08-21 DIAGNOSIS — N202 Calculus of kidney with calculus of ureter: Secondary | ICD-10-CM | POA: Diagnosis present

## 2023-08-21 DIAGNOSIS — N12 Tubulo-interstitial nephritis, not specified as acute or chronic: Secondary | ICD-10-CM | POA: Diagnosis not present

## 2023-08-21 DIAGNOSIS — I10 Essential (primary) hypertension: Secondary | ICD-10-CM | POA: Diagnosis present

## 2023-08-21 DIAGNOSIS — N179 Acute kidney failure, unspecified: Secondary | ICD-10-CM | POA: Diagnosis present

## 2023-08-21 DIAGNOSIS — R059 Cough, unspecified: Secondary | ICD-10-CM | POA: Diagnosis not present

## 2023-08-21 DIAGNOSIS — M5412 Radiculopathy, cervical region: Secondary | ICD-10-CM | POA: Diagnosis not present

## 2023-08-21 DIAGNOSIS — Z9181 History of falling: Secondary | ICD-10-CM | POA: Diagnosis not present

## 2023-08-21 DIAGNOSIS — K5790 Diverticulosis of intestine, part unspecified, without perforation or abscess without bleeding: Secondary | ICD-10-CM | POA: Diagnosis not present

## 2023-08-21 DIAGNOSIS — R6520 Severe sepsis without septic shock: Secondary | ICD-10-CM | POA: Diagnosis present

## 2023-08-21 DIAGNOSIS — E875 Hyperkalemia: Secondary | ICD-10-CM

## 2023-08-21 DIAGNOSIS — K5792 Diverticulitis of intestine, part unspecified, without perforation or abscess without bleeding: Secondary | ICD-10-CM | POA: Diagnosis not present

## 2023-08-21 DIAGNOSIS — N200 Calculus of kidney: Secondary | ICD-10-CM | POA: Diagnosis not present

## 2023-08-21 DIAGNOSIS — M7062 Trochanteric bursitis, left hip: Secondary | ICD-10-CM | POA: Diagnosis not present

## 2023-08-21 DIAGNOSIS — K760 Fatty (change of) liver, not elsewhere classified: Secondary | ICD-10-CM | POA: Diagnosis not present

## 2023-08-21 DIAGNOSIS — M4802 Spinal stenosis, cervical region: Secondary | ICD-10-CM | POA: Diagnosis not present

## 2023-08-21 DIAGNOSIS — E872 Acidosis, unspecified: Secondary | ICD-10-CM | POA: Diagnosis present

## 2023-08-21 LAB — BLOOD CULTURE ID PANEL (REFLEXED) - BCID2

## 2023-08-21 LAB — I-STAT CG4 LACTIC ACID, ED
Lactic Acid, Venous: 3 mmol/L (ref 0.5–1.9)
Lactic Acid, Venous: 4.7 mmol/L (ref 0.5–1.9)
Lactic Acid, Venous: 4.8 mmol/L (ref 0.5–1.9)

## 2023-08-21 LAB — PROTIME-INR
INR: 1.2 (ref 0.8–1.2)
Prothrombin Time: 15.2 s (ref 11.4–15.2)

## 2023-08-21 LAB — BASIC METABOLIC PANEL WITH GFR
Anion gap: 14 (ref 5–15)
BUN: 52 mg/dL — ABNORMAL HIGH (ref 8–23)
CO2: 17 mmol/L — ABNORMAL LOW (ref 22–32)
Calcium: 8 mg/dL — ABNORMAL LOW (ref 8.9–10.3)
Chloride: 97 mmol/L — ABNORMAL LOW (ref 98–111)
Creatinine, Ser: 2.87 mg/dL — ABNORMAL HIGH (ref 0.61–1.24)
GFR, Estimated: 22 mL/min — ABNORMAL LOW (ref >60)
Glucose, Bld: 76 mg/dL (ref 70–99)
Potassium: 5 mmol/L (ref 3.5–5.1)
Sodium: 128 mmol/L — ABNORMAL LOW (ref 135–145)

## 2023-08-21 LAB — DIC (DISSEMINATED INTRAVASCULAR COAGULATION)PANEL
D-Dimer, Quant: 3.62 ug{FEU}/mL — ABNORMAL HIGH (ref 0.00–0.50)
Fibrinogen: >800 mg/dL — ABNORMAL HIGH (ref 210–475)
INR: 1.2 (ref 0.8–1.2)
Platelets: 73 10*3/uL — ABNORMAL LOW (ref 150–400)
Prothrombin Time: 15.6 s — ABNORMAL HIGH (ref 11.4–15.2)
Smear Review: NONE SEEN
aPTT: 30 s (ref 24–36)

## 2023-08-21 LAB — COMPREHENSIVE METABOLIC PANEL WITH GFR
ALT: 18 U/L (ref 0–44)
AST: 23 U/L (ref 15–41)
Albumin: 2.5 g/dL — ABNORMAL LOW (ref 3.5–5.0)
Alkaline Phosphatase: 67 U/L (ref 38–126)
Anion gap: 11 (ref 5–15)
BUN: 53 mg/dL — ABNORMAL HIGH (ref 8–23)
CO2: 21 mmol/L — ABNORMAL LOW (ref 22–32)
Calcium: 8 mg/dL — ABNORMAL LOW (ref 8.9–10.3)
Chloride: 98 mmol/L (ref 98–111)
Creatinine, Ser: 2.7 mg/dL — ABNORMAL HIGH (ref 0.61–1.24)
GFR, Estimated: 24 mL/min — ABNORMAL LOW (ref >60)
Glucose, Bld: 93 mg/dL (ref 70–99)
Potassium: 4.8 mmol/L (ref 3.5–5.1)
Sodium: 130 mmol/L — ABNORMAL LOW (ref 135–145)
Total Bilirubin: 0.9 mg/dL (ref 0.0–1.2)
Total Protein: 5.5 g/dL — ABNORMAL LOW (ref 6.5–8.1)

## 2023-08-21 LAB — BLOOD GAS, ARTERIAL
Acid-base deficit: 4.5 mmol/L — ABNORMAL HIGH (ref 0.0–2.0)
Bicarbonate: 20.3 mmol/L (ref 20.0–28.0)
Drawn by: 23532
FIO2: 21 %
O2 Saturation: 96.7 %
Patient temperature: 37
pCO2 arterial: 36 mmHg (ref 32–48)
pH, Arterial: 7.36 (ref 7.35–7.45)
pO2, Arterial: 74 mmHg — ABNORMAL LOW (ref 83–108)

## 2023-08-21 LAB — LACTIC ACID, PLASMA
Lactic Acid, Venous: 3.2 mmol/L (ref 0.5–1.9)
Lactic Acid, Venous: 2.2 mmol/L (ref 0.5–1.9)

## 2023-08-21 LAB — CBC
HCT: 34.7 % — ABNORMAL LOW (ref 39.0–52.0)
Hemoglobin: 11.5 g/dL — ABNORMAL LOW (ref 13.0–17.0)
MCH: 31.7 pg (ref 26.0–34.0)
MCHC: 33.1 g/dL (ref 30.0–36.0)
MCV: 95.6 fL (ref 80.0–100.0)
Platelets: 75 10*3/uL — ABNORMAL LOW (ref 150–400)
RBC: 3.63 MIL/uL — ABNORMAL LOW (ref 4.22–5.81)
RDW: 13.6 % (ref 11.5–15.5)
WBC: 10.9 10*3/uL — ABNORMAL HIGH (ref 4.0–10.5)
nRBC: 0 % (ref 0.0–0.2)

## 2023-08-21 LAB — GLUCOSE, CAPILLARY
Glucose-Capillary: 89 mg/dL (ref 70–99)
Glucose-Capillary: 88 mg/dL (ref 70–99)
Glucose-Capillary: 95 mg/dL (ref 70–99)
Glucose-Capillary: 103 mg/dL — ABNORMAL HIGH (ref 70–99)

## 2023-08-21 LAB — VITAMIN B12: Vitamin B-12: 225 pg/mL (ref 180–914)

## 2023-08-21 LAB — AMMONIA: Ammonia: 16 umol/L (ref 9–35)

## 2023-08-21 LAB — SODIUM, URINE, RANDOM: Sodium, Ur: 62 mmol/L

## 2023-08-21 LAB — OSMOLALITY, URINE: Osmolality, Ur: 308 mosm/kg (ref 300–900)

## 2023-08-21 LAB — OSMOLALITY: Osmolality: 291 mosm/kg (ref 275–295)

## 2023-08-21 MED ORDER — SODIUM CHLORIDE 0.9 % IV SOLN
2.0000 g | INTRAVENOUS | Status: DC
  Administered 2023-08-21 – 2023-08-24 (×4): 2 g via INTRAVENOUS
  Filled 2023-08-21 (×4): qty 20

## 2023-08-21 MED ORDER — VANCOMYCIN HCL 1.25 G IV SOLR: 1250.0000 mg | INTRAVENOUS | Status: DC

## 2023-08-21 MED ORDER — CHLORHEXIDINE GLUCONATE CLOTH 2 % EX PADS
6.0000 | MEDICATED_PAD | Freq: Every day | CUTANEOUS | Status: DC
  Administered 2023-08-21 – 2023-08-22 (×2): 6 via TOPICAL

## 2023-08-21 MED ORDER — INSULIN GLARGINE 100 UNIT/ML ~~LOC~~ SOLN
10.0000 [IU] | Freq: Every day | SUBCUTANEOUS | Status: DC
  Filled 2023-08-21: qty 0.1

## 2023-08-21 MED ORDER — SODIUM CHLORIDE 0.9 % IV SOLN: INTRAVENOUS | Status: DC

## 2023-08-21 MED ORDER — ACETAMINOPHEN 650 MG RE SUPP: 650.0000 mg | Freq: Four times a day (QID) | RECTAL | Status: DC | PRN

## 2023-08-21 MED ORDER — HYDRALAZINE HCL 20 MG/ML IJ SOLN: 10.0000 mg | INTRAMUSCULAR | Status: DC | PRN

## 2023-08-21 MED ORDER — SODIUM CHLORIDE 0.9 % IV BOLUS
1000.0000 mL | INTRAVENOUS | Status: AC
  Administered 2023-08-21: 1000 mL via INTRAVENOUS

## 2023-08-21 MED ORDER — ONDANSETRON HCL 4 MG/2ML IJ SOLN
4.0000 mg | Freq: Four times a day (QID) | INTRAMUSCULAR | Status: DC | PRN
  Administered 2023-08-25 – 2023-08-28 (×2): 4 mg via INTRAVENOUS
  Filled 2023-08-21 (×2): qty 2

## 2023-08-21 MED ORDER — ASPIRIN 81 MG PO TABS: 81.0000 mg | ORAL_TABLET | Freq: Every day | ORAL | Status: DC

## 2023-08-21 MED ORDER — IPRATROPIUM-ALBUTEROL 0.5-2.5 (3) MG/3ML IN SOLN
3.0000 mL | RESPIRATORY_TRACT | Status: DC | PRN
  Administered 2023-08-23: 3 mL via RESPIRATORY_TRACT
  Filled 2023-08-21: qty 3

## 2023-08-21 MED ORDER — MORPHINE SULFATE (PF) 2 MG/ML IV SOLN
2.0000 mg | INTRAVENOUS | Status: DC | PRN
  Administered 2023-08-21 – 2023-08-24 (×7): 2 mg via INTRAVENOUS
  Filled 2023-08-21 (×7): qty 1

## 2023-08-21 MED ORDER — SODIUM ZIRCONIUM CYCLOSILICATE 10 G PO PACK
10.0000 g | PACK | Freq: Once | ORAL | Status: AC
  Administered 2023-08-21: 10 g via ORAL
  Filled 2023-08-21: qty 1

## 2023-08-21 MED ORDER — INSULIN ASPART 100 UNIT/ML IJ SOLN
0.0000 [IU] | Freq: Three times a day (TID) | INTRAMUSCULAR | Status: DC
  Administered 2023-08-22 (×2): 1 [IU] via SUBCUTANEOUS
  Administered 2023-08-23: 3 [IU] via SUBCUTANEOUS
  Administered 2023-08-23: 1 [IU] via SUBCUTANEOUS
  Administered 2023-08-23: 2 [IU] via SUBCUTANEOUS
  Administered 2023-08-24: 3 [IU] via SUBCUTANEOUS
  Administered 2023-08-24: 2 [IU] via SUBCUTANEOUS
  Administered 2023-08-24: 4 [IU] via SUBCUTANEOUS
  Administered 2023-08-25 – 2023-08-26 (×3): 2 [IU] via SUBCUTANEOUS
  Administered 2023-08-26 (×2): 1 [IU] via SUBCUTANEOUS
  Administered 2023-08-27 (×2): 2 [IU] via SUBCUTANEOUS
  Administered 2023-08-27: 1 [IU] via SUBCUTANEOUS
  Administered 2023-08-28: 2 [IU] via SUBCUTANEOUS
  Administered 2023-08-28 – 2023-09-02 (×8): 1 [IU] via SUBCUTANEOUS
  Administered 2023-09-02: 0 [IU] via SUBCUTANEOUS
  Administered 2023-09-02 – 2023-09-03 (×3): 1 [IU] via SUBCUTANEOUS
  Filled 2023-08-21: qty 0.06

## 2023-08-21 MED ORDER — DEXTROSE 50 % IV SOLN
1.0000 | Freq: Once | INTRAVENOUS | Status: AC
  Administered 2023-08-21: 50 mL via INTRAVENOUS
  Filled 2023-08-21: qty 50

## 2023-08-21 MED ORDER — SODIUM CHLORIDE 0.9 % IV SOLN: 250.0000 mL | INTRAVENOUS | Status: AC | PRN

## 2023-08-21 MED ORDER — INSULIN ASPART 100 UNIT/ML IJ SOLN
0.0000 [IU] | Freq: Every day | INTRAMUSCULAR | Status: DC
  Administered 2023-08-23: 2 [IU] via SUBCUTANEOUS
  Administered 2023-08-24: 3 [IU] via SUBCUTANEOUS
  Administered 2023-08-25: 2 [IU] via SUBCUTANEOUS
  Filled 2023-08-21: qty 0.05

## 2023-08-21 MED ORDER — NALOXONE HCL 0.4 MG/ML IJ SOLN: 0.4000 mg | INTRAMUSCULAR | Status: DC | PRN

## 2023-08-21 MED ORDER — LACTATED RINGERS IV SOLN: INTRAVENOUS | Status: DC

## 2023-08-21 MED ORDER — SENNOSIDES-DOCUSATE SODIUM 8.6-50 MG PO TABS
1.0000 | ORAL_TABLET | Freq: Every evening | ORAL | Status: DC | PRN
  Administered 2023-08-24: 1 via ORAL
  Filled 2023-08-21: qty 1

## 2023-08-21 MED ORDER — METOPROLOL TARTRATE 5 MG/5ML IV SOLN: 5.0000 mg | INTRAVENOUS | Status: DC | PRN

## 2023-08-21 MED ORDER — ACETAMINOPHEN 10 MG/ML IV SOLN
1000.0000 mg | Freq: Once | INTRAVENOUS | Status: AC
  Administered 2023-08-21: 1000 mg via INTRAVENOUS
  Filled 2023-08-21: qty 100

## 2023-08-21 MED ORDER — ONDANSETRON HCL 4 MG PO TABS: 4.0000 mg | ORAL_TABLET | Freq: Four times a day (QID) | ORAL | Status: DC | PRN

## 2023-08-21 MED ORDER — SODIUM CHLORIDE 0.9% FLUSH
3.0000 mL | Freq: Two times a day (BID) | INTRAVENOUS | Status: DC
  Administered 2023-08-21 – 2023-09-03 (×25): 3 mL via INTRAVENOUS

## 2023-08-21 MED ORDER — SODIUM ZIRCONIUM CYCLOSILICATE 10 G PO PACK: 10.0000 g | PACK | Freq: Once | ORAL | Status: DC

## 2023-08-21 MED ORDER — DOCUSATE SODIUM 100 MG PO CAPS
100.0000 mg | ORAL_CAPSULE | Freq: Two times a day (BID) | ORAL | Status: DC
  Administered 2023-08-21 – 2023-09-03 (×19): 100 mg via ORAL
  Filled 2023-08-21 (×24): qty 1

## 2023-08-21 MED ORDER — VANCOMYCIN HCL 2000 MG/400ML IV SOLN
2000.0000 mg | Freq: Once | INTRAVENOUS | Status: AC
  Administered 2023-08-21: 2000 mg via INTRAVENOUS
  Filled 2023-08-21: qty 400

## 2023-08-21 MED ORDER — GLUCAGON HCL RDNA (DIAGNOSTIC) 1 MG IJ SOLR: 1.0000 mg | INTRAMUSCULAR | Status: DC | PRN

## 2023-08-21 MED ORDER — SODIUM CHLORIDE 0.9 % IV SOLN
2.0000 g | INTRAVENOUS | Status: DC
  Administered 2023-08-21: 2 g via INTRAVENOUS
  Filled 2023-08-21: qty 12.5

## 2023-08-21 MED ORDER — ACETAMINOPHEN 325 MG PO TABS
650.0000 mg | ORAL_TABLET | Freq: Four times a day (QID) | ORAL | Status: DC | PRN
  Administered 2023-08-21 – 2023-08-25 (×3): 650 mg via ORAL
  Filled 2023-08-21 (×3): qty 2

## 2023-08-21 MED ORDER — GUAIFENESIN 100 MG/5ML PO LIQD: 5.0000 mL | ORAL | Status: DC | PRN

## 2023-08-21 MED ORDER — SERTRALINE HCL 50 MG PO TABS
150.0000 mg | ORAL_TABLET | Freq: Every day | ORAL | Status: DC
  Administered 2023-08-21 – 2023-09-03 (×14): 150 mg via ORAL
  Filled 2023-08-21 (×14): qty 3

## 2023-08-21 MED ORDER — LACTATED RINGERS IV BOLUS
1000.0000 mL | Freq: Once | INTRAVENOUS | Status: AC
  Administered 2023-08-21: 1000 mL via INTRAVENOUS

## 2023-08-21 MED ORDER — SODIUM CHLORIDE 0.9% FLUSH: 3.0000 mL | INTRAVENOUS | Status: DC | PRN

## 2023-08-21 MED ORDER — LEVOTHYROXINE SODIUM 50 MCG PO TABS
50.0000 ug | ORAL_TABLET | Freq: Every day | ORAL | Status: DC
  Administered 2023-08-21 – 2023-09-03 (×14): 50 ug via ORAL
  Filled 2023-08-21 (×4): qty 1
  Filled 2023-08-21: qty 2
  Filled 2023-08-21 (×10): qty 1

## 2023-08-21 MED ORDER — INSULIN ASPART 100 UNIT/ML IJ SOLN
5.0000 [IU] | Freq: Once | INTRAMUSCULAR | Status: AC
Start: 2023-08-21 — End: 2023-08-21
  Administered 2023-08-21: 5 [IU] via INTRAVENOUS
  Filled 2023-08-21: qty 0.05

## 2023-08-21 MED ORDER — TRAZODONE HCL 50 MG PO TABS
50.0000 mg | ORAL_TABLET | Freq: Every evening | ORAL | Status: DC | PRN
  Administered 2023-08-25 – 2023-08-29 (×3): 50 mg via ORAL
  Filled 2023-08-21 (×3): qty 1

## 2023-08-21 MED ORDER — BUPROPION HCL ER (XL) 150 MG PO TB24
150.0000 mg | ORAL_TABLET | Freq: Every day | ORAL | Status: DC
  Administered 2023-08-21 – 2023-09-03 (×14): 150 mg via ORAL
  Filled 2023-08-21 (×14): qty 1

## 2023-08-21 MED ORDER — SODIUM CHLORIDE 0.9% FLUSH
3.0000 mL | Freq: Two times a day (BID) | INTRAVENOUS | Status: DC
  Administered 2023-08-21 – 2023-09-03 (×25): 3 mL via INTRAVENOUS

## 2023-08-21 MED ORDER — TAMSULOSIN HCL 0.4 MG PO CAPS
0.4000 mg | ORAL_CAPSULE | Freq: Every evening | ORAL | Status: DC
  Administered 2023-08-21 – 2023-09-02 (×13): 0.4 mg via ORAL
  Filled 2023-08-21 (×13): qty 1

## 2023-08-21 MED ORDER — INSULIN GLARGINE-YFGN 100 UNIT/ML ~~LOC~~ SOLN
10.0000 [IU] | Freq: Every day | SUBCUTANEOUS | Status: DC
  Filled 2023-08-21 (×2): qty 0.1

## 2023-08-21 MED ORDER — ORAL CARE MOUTH RINSE: 15.0000 mL | OROMUCOSAL | Status: DC | PRN

## 2023-08-21 NOTE — Consult Note (Signed)
 Urology Consult Note   Requesting Attending Physician:  Maggie Schooner, MD Service Providing Consult: Urology  Consulting Attending: Dr. Estanislao Heimlich   Reason for Consult:  Sepsis s/p   HPI: [name] is seen in consultation for reasons noted above at the request of Maggie Schooner, MD. patient is a 78 year old male known to our practice.  He arrived at [facility] on 08/20/2023 with complaints of bilateral flank pain and hematuria.  He had just undergone bilateral ureteroscopy with lithotripsy and stent placement with Dr. Jarvis Mesa on 08/18/2023.  PMH significant for T2DM on insulin , HTN, HLD, CAD, paroxysmal A-fib, hypothyroidism, GERD, chronic anemia, and morbid obesity.  On my arrival patient was sleeping but was awoken easily.  He is hard of hearing.  We reviewed the case and plan and he expressed understanding.  ------------------  Assessment:  78 y.o. male with Bilateral ureteral stents following URS, now septic in ICU   Recommendations: #sepsis #bilateral ureteral stents #AKI CT A/P shows stents to be in good position.  Tethered stents were scheduled to stay in place for 7 days.  If patient rebounds quickly we should be able to stay on schedule, with the added benefit of having them removed while he is still receiving antibiotics. Foley catheter placed adjacent to tether stent strings successfully.  Draining clear yellow urine.  Leave in place to maximize drainage until patient is stable. Trend labs, AKI presumably prerenal.  Baseline 0.8 Urology will follow peripherally  Case and plan discussed with Dr. Estanislao Heimlich  Past Medical History: Past Medical History:  Diagnosis Date   Abnormal stress test    Anxiety    Arthritis    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HCC) 03/14/2013   Atypical chest pain 12/28/2013   CAD in native artery 03/14/2013   Cervical disc disorder at C5-C6 level with radiculopathy 06/09/2022   Cervical  radiculopathy at C5 06/10/2022   Chest pain 07/30/2011   1999 Cath - <50% stenosis 2008 NUC - low risk    Chronic back pain    herniated disc   Coronary artery disease    takes Plavix  and ASA daily   Depression    takes Prozac  daily   Diabetes mellitus type 2, controlled, with complications (HCC) 07/30/2011   Displacement of lumbar intervertebral disc without myelopathy 05/30/2014   Diverticulitis    hx of   Dizziness    occasionally and states Dr.Smith is aware   DJD (degenerative joint disease)    Essential hypertension, benign 07/30/2011   Fatigue 09/06/2019   GERD (gastroesophageal reflux disease)    takes an OTC antacid   Gout    yrs ago and doesn't take any meds   Hard of hearing    wears hearing aids   Headache(784.0)    occasionally   History of colon polyps    benign   History of fusion of cervical spine 09/04/2020   Last Assessment & Plan:   Formatting of this note might be different from the original.  Review of recent cervical spine CT on care everywhere notes ACDF from C5-7 without hardware complication.  At this time he is doing well without any symptomatic issues.   History of kidney stones    HNP (herniated nucleus pulposus), lumbar 06/14/2014   Hyperlipidemia    takes Atorvastatin  daily   Hypertension    takes Amlodipine ,Losartan ,and Coreg  daily   Hypothyroidism    takes Synthroid  daily   IBS (irritable bowel syndrome)  Lumbar post-laminectomy syndrome 09/04/2020   Last Assessment & Plan:   Formatting of this note might be different from the original.  78 year old male with chronic low back pain history of multiple lumbar spine surgeries, he has had a multilevel decompression and is fused from L4 to the sacrum.  His lumbar MRI completed in 2017 shows improvement compared with prior status post left L3-4 diskectomy in March of 2016 there is mild residual mass   Nontraumatic complete tear of right rotator cuff 06/09/2022   Obesity, unspecified 07/30/2011    Other spondylosis, lumbar region 02/12/2021   Pain medication agreement 09/04/2020   Last Assessment & Plan:   Formatting of this note might be different from the original.  Agreement up-to-date.  Patient and I have discussed the hazardous effects of continued opiate pain medication usage. Risks and benefits of above medications including but not limited to possibility of respiratory depression, sedation, and even death were discussed with the patient who expressed an understandin   PAT (paroxysmal atrial tachycardia) (HCC) 10/12/2020   Preoperative cardiovascular examination 09/06/2019   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   Right sided weakness 06/04/2022   Shortness of breath dyspnea    with exertion   Swelling of lower limb 06/20/2014   Tuberculosis    Patient denies Tb   Type II diabetes mellitus (HCC)    takes Metformin  daily   Unstable angina (HCC) 12/27/2013   Weakness    numbness and tingling in left leg    Past Surgical History:  Past Surgical History:  Procedure Laterality Date   ANTERIOR CERVICAL DECOMP/DISCECTOMY FUSION  04/2007   C4-5; C6-7   BACK SURGERY     "3 times; last time was screws in lower back 10/2008"   CHOLECYSTECTOMY  1970's   COLONOSCOPY     CORONARY ANGIOPLASTY  1999/2013   1 stent   CYSTOSCOPY     CYSTOSCOPY/RETROGRADE/URETEROSCOPY/STONE EXTRACTION WITH BASKET Bilateral 07/13/2023   Procedure: CYSTOSCOPY, BILATERAL URETERAL STENT PLACEMENT, BILATERAL RETROGRADE PYELOGRAM;  Surgeon: Mallie Seal, MD;  Location: [facility] OR;  Service: Urology;  Laterality: Bilateral;   CYSTOSCOPY/URETEROSCOPY/HOLMIUM LASER/STENT PLACEMENT Bilateral 08/18/2023   Procedure: CYSTOSCOPY/URETEROSCOPY/HOLMIUM LASER/STENT PLACEMENT;  Surgeon: Mallie Seal, MD;  Location: [facility] OR;  Service: Urology;  Laterality: Bilateral;   LEFT HEART CATH AND CORONARY ANGIOGRAPHY N/A 09/30/2019   Procedure: LEFT HEART CATH AND CORONARY ANGIOGRAPHY;  Surgeon: Lucendia Rusk,  MD;  Location: [facility] INVASIVE CV LAB;  Service: Cardiovascular;  Laterality: N/A;   LEFT HEART CATHETERIZATION WITH CORONARY ANGIOGRAM N/A 08/21/2011   Procedure: LEFT HEART CATHETERIZATION WITH CORONARY ANGIOGRAM;  Surgeon: Mickiel Albany, MD;  Location: [facility] CATH LAB;  Service: Cardiovascular;  Laterality: N/A;   LITHOTRIPSY     LUMBAR LAMINECTOMY/DECOMPRESSION MICRODISCECTOMY Left 06/14/2014   Procedure: LEFT LUMBAR THREE-FOUR LANINOTOMY AND MICRODISKECTOMY.;  Surgeon: Corrina Dimitri, MD;  Location: [facility] NEURO ORS;  Service: Neurosurgery;  Laterality: Left;  left   PCI  5/09/2-13   LAD   RADIOLOGY WITH ANESTHESIA N/A 11/13/2015   Procedure: MRI OF LUMBAR SPINE W/WO CONTRAST    (RADIOLOGY WITH ANESTHESIA);  Surgeon: Medication Radiologist, MD;  Location: [facility] OR;  Service: Radiology;  Laterality: N/A;   RADIOLOGY WITH ANESTHESIA N/A 06/05/2022   Procedure: MRI WITH ANESTHESIA;  Surgeon: Radiologist, Medication, MD;  Location: [facility] OR;  Service: Radiology;  Laterality: N/A;   RADIOLOGY WITH ANESTHESIA N/A 06/08/2022   Procedure: MRI WITH ANESTHESIA;  Surgeon: Radiologist, Medication, MD;  Location: [facility] OR;  Service:  Radiology;  Laterality: N/A;   SHOULDER ARTHROSCOPY W/ ROTATOR CUFF REPAIR  ~ 2011   left    Medication: Current Facility-Administered Medications  Medication Dose Route Frequency Provider Last Rate Last Admin   0.9 %  sodium chloride  infusion  250 mL Intravenous PRN Sundil, Subrina, MD       0.9 %  sodium chloride  infusion   Intravenous Continuous Amin, Ankit C, MD       acetaminophen  (TYLENOL ) tablet 650 mg  650 mg Oral Q6H PRN Sundil, Subrina, MD       Or   acetaminophen  (TYLENOL ) suppository 650 mg  650 mg Rectal Q6H PRN Sundil, Subrina, MD       buPROPion  (WELLBUTRIN  XL) 24 hr tablet 150 mg  150 mg Oral Daily Sundil, Subrina, MD   150 mg at 08/21/23 1045   ceFEPIme  (MAXIPIME ) 2 g in sodium chloride  0.9 % 100 mL IVPB  2 g Intravenous Q24H Nathaniel Bald, RPH   Stopped at 08/21/23  0137   Chlorhexidine  Gluconate Cloth 2 % PADS 6 each  6 each Topical Daily Amin, Ankit C, MD   6 each at 08/21/23 0900   glucagon (human recombinant) (GLUCAGEN) injection 1 mg  1 mg Intravenous PRN Amin, Ankit C, MD       guaiFENesin (ROBITUSSIN) 100 MG/5ML liquid 5 mL  5 mL Oral Q4H PRN Amin, Ankit C, MD       hydrALAZINE  (APRESOLINE ) injection 10 mg  10 mg Intravenous Q4H PRN Amin, Ankit C, MD       insulin  aspart (novoLOG ) injection 0-5 Units  0-5 Units Subcutaneous QHS Sundil, Subrina, MD       insulin  aspart (novoLOG ) injection 0-6 Units  0-6 Units Subcutaneous TID WC Sundil, Subrina, MD       insulin  glargine-yfgn (SEMGLEE ) injection 10 Units  10 Units Subcutaneous Daily Amin, Ankit C, MD       ipratropium-albuterol (DUONEB) 0.5-2.5 (3) MG/3ML nebulizer solution 3 mL  3 mL Nebulization Q4H PRN Amin, Ankit C, MD       levothyroxine  (SYNTHROID ) tablet 50 mcg  50 mcg Oral QAC breakfast Sundil, Subrina, MD   50 mcg at 08/21/23 0740   metoprolol  tartrate (LOPRESSOR ) injection 5 mg  5 mg Intravenous Q4H PRN Amin, Ankit C, MD       naloxone  (NARCAN ) injection 0.4 mg  0.4 mg Intravenous PRN Sundil, Subrina, MD       ondansetron  (ZOFRAN ) tablet 4 mg  4 mg Oral Q6H PRN Sundil, Subrina, MD       Or   ondansetron  (ZOFRAN ) injection 4 mg  4 mg Intravenous Q6H PRN Sundil, Subrina, MD       senna-docusate (Senokot-S) tablet 1 tablet  1 tablet Oral QHS PRN Amin, Ankit C, MD       sertraline  (ZOLOFT ) tablet 150 mg  150 mg Oral Daily Sundil, Subrina, MD   150 mg at 08/21/23 1045   sodium chloride  flush (NS) 0.9 % injection 3 mL  3 mL Intravenous Q12H Sundil, Subrina, MD   3 mL at 08/21/23 1045   sodium chloride  flush (NS) 0.9 % injection 3 mL  3 mL Intravenous Q12H Sundil, Subrina, MD   3 mL at 08/21/23 1045   sodium chloride  flush (NS) 0.9 % injection 3 mL  3 mL Intravenous PRN Sundil, Subrina, MD       tamsulosin (FLOMAX) capsule 0.4 mg  0.4 mg Oral QPM Sundil, Subrina, MD       traZODone (DESYREL)  tablet  50 mg  50 mg Oral QHS PRN Amin, Ankit C, MD       [START ON 08/23/2023] Vancomycin (VANCOCIN) 1,250 mg in sodium chloride  0.9 % 250 mL IVPB  1,250 mg Intravenous Q48H Poindexter, Leann T, RPH        Allergies: Allergies  Allergen Reactions   Trimethoprim    Misc. Sulfonamide Containing Compounds Rash   Sulfa Antibiotics Rash    Social History: Social History   Tobacco Use   Smoking status: Former   Smokeless tobacco: Current    Types: Chew  Substance Use Topics   Alcohol use: No   Drug use: No    Family History Family History  Problem Relation Age of Onset   Cancer Father        colon   Arthritis Mother    Healthy Sister    Heart attack Brother    Heart disease Brother    Hypertension Brother    Healthy Sister     Review of Systems  Genitourinary:  Negative for dysuria, flank pain, frequency, hematuria and urgency.     Objective   Vital signs in last 24 hours: BP (!) 110/48 (BP Location: Right Arm)   Pulse 90   Temp 97.9 F (36.6 C) (Oral)   Resp 18   Ht 5\' 9"  (1.753 m)   Wt 120.5 kg   SpO2 97%   BMI 39.23 kg/m   Physical Exam General: A&O, resting, appropriate HEENT: [place]/AT Pulmonary: Normal work of breathing Cardiovascular: no cyanosis Abdomen: Soft, NTTP, nondistended GU: foley in place draining clear yellow urine, stent tether visible.  Neuro: Appropriate, no focal neurological deficits  Most Recent Labs: Lab Results  Component Value Date   WBC 10.9 (H) 08/21/2023   HGB 11.5 (L) 08/21/2023   HCT 34.7 (L) 08/21/2023   PLT 75 (L) 08/21/2023   PLT 73 (L) 08/21/2023    Lab Results  Component Value Date   NA 130 (L) 08/21/2023   K 4.8 08/21/2023   CL 98 08/21/2023   CO2 21 (L) 08/21/2023   BUN 53 (H) 08/21/2023   CREATININE 2.70 (H) 08/21/2023   CALCIUM  8.0 (L) 08/21/2023   MG 2.1 07/17/2023   PHOS 4.2 07/14/2023    Lab Results  Component Value Date   INR 1.2 08/21/2023   APTT 30 08/21/2023     Urine  Culture: @[id number] (laburin,org,[id number])@   IMAGING: CT Renal Stone Study Result Date: 08/20/2023 CLINICAL DATA:  Bilateral flank pain, hematuria. Recent lithotripsy. EXAM: CT ABDOMEN AND PELVIS WITHOUT CONTRAST TECHNIQUE: Multidetector CT imaging of the abdomen and pelvis was performed following the standard protocol without IV contrast. RADIATION DOSE REDUCTION: This exam was performed according to the departmental dose-optimization program which includes automated exposure control, adjustment of the mA and/or kV according to patient size and/or use of iterative reconstruction technique. COMPARISON:  07/13/2023 FINDINGS: Lower chest: Bibasilar atelectasis. No effusions. Coronary artery and aortic atherosclerosis. Hepatobiliary: No focal liver abnormality is seen. Status post cholecystectomy. No biliary dilatation. Pancreas: No focal abnormality or ductal dilatation. Spleen: No focal abnormality.  Normal size. Adrenals/Urinary Tract: Adrenal glands normal. Bilateral ureteral stents are in place. Air within the renal collecting systems bilaterally and bladder. Probable blood clot in the right renal pelvis. Previously seen ureteral stones no longer visualized. There is also air noted along the periphery of the left kidney midpole of unknown etiology. No focal fluid collection in the region to suggest abscess. Stomach/Bowel: Normal appendix. Scattered colonic diverticulosis, most pronounced in  the sigmoid colon. No active diverticulitis. Stomach and small bowel decompressed. Vascular/Lymphatic: Aortic atherosclerosis. No evidence of aneurysm or adenopathy. Reproductive: No visible focal abnormality. Other: No free fluid or free air. Musculoskeletal: No acute bony abnormality. Postoperative and degenerative changes in the lumbar spine. IMPRESSION: Interval placement of bilateral ureteral stents. Previously seen proximal ureteral stones no longer visualized. Air noted within the collecting systems and bladder  likely related to recent stent placement. Probable blood clot within the right renal pelvis. No hydronephrosis. Air also noted along the surface of the left kidney at the midpole of unknown etiology. No fluid collection to suggest abscess. Aortic atherosclerosis. Colonic diverticulosis. Electronically Signed   By: Janeece Mechanic M.D.   On: 08/20/2023 22:58   DG Chest Port 1 View Result Date: 08/20/2023 CLINICAL DATA:  Sepsis EXAM: PORTABLE CHEST 1 VIEW COMPARISON:  Chest x-ray 08/01/2022 FINDINGS: The heart size and mediastinal contours are within normal limits. Both lungs are clear. No acute fractures are seen. Cervical spinal fusion plate is present. IMPRESSION: No active disease. Electronically Signed   By: Tyron Gallon M.D.   On: 08/20/2023 22:03    ------  Alla Ar, NP Pager: [phone]   Please contact the urology consult pager with any further questions/concerns.

## 2023-08-21 NOTE — ED Notes (Addendum)
 Pt. I-stat Lactic Acid CG4  results 3.0, EDP, Candelaria Chaco, MD made aware.

## 2023-08-21 NOTE — ED Notes (Signed)
 Got in bag pre irrigation. After irrigation there was post irrigation total, so only added to bag. There is no clots present at this time draining from catheter.

## 2023-08-21 NOTE — Sepsis Progress Note (Addendum)
 Elink monitoring for the code sepsis protocol.  All labs & antibiotics given 2 hrs prior to code sepsis being ordered.

## 2023-08-21 NOTE — ED Provider Notes (Signed)
 Lactic acid has improved following IV hydration, but is still elevated.  I have ordered additional IV fluids.   Alissa April, MD 08/21/23 [phone]

## 2023-08-21 NOTE — Plan of Care (Signed)
   Problem: Coping: Goal: Ability to adjust to condition or change in health will improve Outcome: Progressing   Problem: Metabolic: Goal: Ability to maintain appropriate glucose levels will improve Outcome: Progressing   Problem: Skin Integrity: Goal: Risk for impaired skin integrity will decrease Outcome: Progressing

## 2023-08-21 NOTE — Progress Notes (Signed)
 Pharmacy Antibiotic Note  [name] is a 78 y.o. male admitted on 08/20/2023 with sepsis.  PMH significant for recent ureteroscopy with lithotripsy and stent placement on 08/18/23.  Pharmacy has been consulted for Vancomycin and Cefepime  dosing.  Plan: Vancomycin 2g IV x 1 followed by Vancomycin 1250 mg IV Q 48 hrs. Goal AUC 400-550. Expected AUC: 537.3  SCr used: 3.17 Cefepime  2g IV q24h Follow renal function F/u culture results & sensitivities    Temp (24hrs), Avg:97.8 F (36.6 C), Min:97.8 F (36.6 C), Max:97.8 F (36.6 C)  Recent Labs  Lab 08/18/23 0647 08/20/23 2145 08/20/23 2151 08/21/23 0011 08/21/23 0033  WBC  --  11.1*  --   --   --   CREATININE 0.74 3.17*  --   --   --   LATICACIDVEN  --   --  4.4* 4.7* 4.8*    Estimated Creatinine Clearance: 24.6 mL/min (A) (by C-G formula based on SCr of 3.17 mg/dL (H)).    Allergies  Allergen Reactions   Trimethoprim    Misc. Sulfonamide Containing Compounds Rash   Sulfa Antibiotics Rash    Antimicrobials this admission: 5/8 Ceftriaxone  x 1 5/9 Vancomycin >>   5/9 Cefepime  >>  Dose adjustments this admission:    Microbiology results: 5/8 BCx:   5/8 UCx:       Thank you for allowing pharmacy to be a part of this patient's care.  Rulon Councilman, PharmD 08/21/2023 12:45 AM

## 2023-08-21 NOTE — Inpatient Diabetes Management (Signed)
 Inpatient Diabetes Program Recommendations  AACE/ADA: New Consensus Statement on Inpatient Glycemic Control (2015)  Target Ranges:  Prepandial:   less than 140 mg/dL      Peak postprandial:   less than 180 mg/dL (1-2 hours)      Critically ill patients:  140 - 180 mg/dL   Lab Results  Component Value Date   GLUCAP 88 08/21/2023   HGBA1C 9.5 (H) 07/14/2023    Review of Glycemic Control  Latest Reference Range & Units 08/20/23 21:50 08/21/23 08:41 08/21/23 11:27  Glucose-Capillary 70 - 99 mg/dL 657 (H) 89 88  (H): Data is abnormally high  Diabetes history: DM2 Outpatient Diabetes medications:  Noovlog 0-9 units BID PRN, Metformin  1000 mg BID Current orders for Inpatient glycemic control: Semglee  10 units every day, Novolog  0-6 units TID and 0-5 units QHS  Inpatient Diabetes Program Recommendations:    Please consider holding Semglee  until Glucose trends are > 180 mg/dL   Received referral for "new to insulin ".  Met with patient and sister at bedside.  Patient is sound asleep.  He lives in LTC and the nursing staff administer rapid insulin  as needed.  He is not on basal insulin  outpatient.  Reviewed A1C is 9.5% with sister.  She states his A1C is not normally this high.  He has had recurrent UTI's which could impact glucose trends.    Will continue to follow while inpatient.  Thank you, Hays Lipschutz, MSN, CDCES Diabetes Coordinator Inpatient Diabetes Program [phone] (team pager from 8a-5p)

## 2023-08-21 NOTE — ED Notes (Signed)
 Pt. I-stat Lactic Acid CG4 results 4.7, EPD, Schlossman made aware.

## 2023-08-21 NOTE — Hospital Course (Addendum)
 Brief Narrative:  78 year old with history of renal stones status post bilateral stents 07/13/2023, DM2, HTN, HLD, CAD, paroxysmal A-fib, hypothyroidism, chronic pain, ataxia, GERD, hypothyroidism, chronic anemia comes to the ED for abnormal lab, hypotension and abdominal pain.  Upon admission CT renal stone study done, reviewed by urology showing good stent placement but recommended Foley catheter placement.  Currently started on empiric IV Rocephin .   Assessment & Plan:  Principal Problem:   Severe sepsis (HCC) Active Problems:   Sepsis secondary to UTI (HCC)   Acute metabolic encephalopathy   Hyperkalemia   Hyponatremia   Thrombocytopenia (HCC)   Insulin  dependent type 2 diabetes mellitus (HCC)   Hyperlipidemia   Essential hypertension   Hypothyroidism   Nephrolithiasis   Paroxysmal atrial fibrillation (HCC)   History of CAD (coronary artery disease)   History of ataxia   Bilateral ureteral stent present   Hematuria    Severe sepsis Sepsis likely secondary from UTI Bilateral nephrolithiasis s/p lithotripsy and ureteral stent 07/13/2023 Hematuria Concerns of urinary tract infection.  Sepsis have improved.  No obvious evidence of obstruction.  Foley catheter placed in the ER per urology recommendations.  Continue IV fluids, cultures growing E. coli.  Continue IV Rocephin     Thrombocytopenia Likely in setting of sepsis.  Closely monitor   Hyponatremia Continue IV fluids   Hyperkalemia Improved after Lokelma  Folic acid deficiency - Folate supplement started   Acute kidney injury - Baseline creatinine 0.7, admission creatinine 3.17.  Foley catheter placed, IV fluids.  Slowly improving   Acute metabolic encephalopathy-multifactorial - Suspected polypharmacy versus infection.  Improved after Narcan  in ER     Metabolic acidosis  -Low bicarb 17.  Normal anion gap 14.  Metabolic acidosis in the setting of AKI and lactic acidosis.  Continue to treat for AKI and lactic  acidosis.   Paroxysmal atrial fibrillation -Holding Coreg  in the setting of hypotension.  At home patient is not on any anticoagulant.   History of CAD -Holding aspirin  and Plavix  in the setting of hematuria and new development of thrombocytopenia.  Will resume once platelet count will be improved   Essential hypertension -Holding home blood pressure regimen in the setting of sepsis.  IV as needed   Hypothyroidism -Continue levothyroxine    Insulin -dependent DM type II - A1c 9.4.  Sliding scale and Accu-Chek.  Discontinue long-acting due to intermittent borderline low normal blood glucose     Generalized anxiety disorder - Continue Zoloft  and Wellbutrin     DVT prophylaxis: SCD's    Code Status: Full Code Family Communication:   Status is: Inpatient Cont hosp stay for IVF, renal function slow to improve Transfer to MedSurg    Subjective: Drwosy but no complaints. Answers all the basic questions    Examination:  General exam: Appears calm and comfortable  Respiratory system: Clear to auscultation. Respiratory effort normal. Cardiovascular system: S1 & S2 heard, RRR. No JVD, murmurs, rubs, gallops or clicks. No pedal edema. Gastrointestinal system: Abdomen is nondistended, soft and nontender. No organomegaly or masses felt. Normal bowel sounds heard. Central nervous system: Alert and oriented. No focal neurological deficits. Extremities: Symmetric 5 x 5 power. Skin: No rashes, lesions or ulcers Psychiatry: Judgement and insight appear normal. Mood & affect appropriate. Foley in place.

## 2023-08-21 NOTE — Progress Notes (Signed)
 PHARMACY - PHYSICIAN COMMUNICATION CRITICAL VALUE ALERT - BLOOD CULTURE IDENTIFICATION (BCID)  [name] is an 78 y.o. male who presented to [facility] on 08/20/2023 with a chief complaint of bilateral flank pain, hematuria, UTI.  He is s/p lithotripsy and bilateral stent placement on 5/6 for bilateral ureteral stones, hydronephrosis.    Assessment:  Blood cultures, 3/4 bottles with GNR.  Today, BCID Escherichia coli - ESBL not detected  Name of physician (or Provider) Contacted: Dr Ariel Begun  Current antibiotics: Cefepime , Vancomycin  Changes to prescribed antibiotics recommended:  Recommendations accepted by provider Change to Ceftriaxone  2g IV q24h   Results for orders placed or performed during the hospital encounter of 08/01/22  Blood Culture ID Panel (Reflexed) (Collected: 08/01/2022  3:58 PM)  Result Value Ref Range   Enterococcus faecalis NOT DETECTED NOT DETECTED   Enterococcus Faecium NOT DETECTED NOT DETECTED   Listeria monocytogenes NOT DETECTED NOT DETECTED   Staphylococcus species NOT DETECTED NOT DETECTED   Staphylococcus aureus (BCID) NOT DETECTED NOT DETECTED   Staphylococcus epidermidis NOT DETECTED NOT DETECTED   Staphylococcus lugdunensis NOT DETECTED NOT DETECTED   Streptococcus species NOT DETECTED NOT DETECTED   Streptococcus agalactiae NOT DETECTED NOT DETECTED   Streptococcus pneumoniae NOT DETECTED NOT DETECTED   Streptococcus pyogenes NOT DETECTED NOT DETECTED   A.calcoaceticus-baumannii NOT DETECTED NOT DETECTED   Bacteroides fragilis NOT DETECTED NOT DETECTED   Enterobacterales DETECTED (A) NOT DETECTED   Enterobacter cloacae complex NOT DETECTED NOT DETECTED   Escherichia coli NOT DETECTED NOT DETECTED   Klebsiella aerogenes NOT DETECTED NOT DETECTED   Klebsiella oxytoca NOT DETECTED NOT DETECTED   Klebsiella pneumoniae DETECTED (A) NOT DETECTED   Proteus species NOT DETECTED NOT DETECTED   Salmonella species NOT DETECTED NOT DETECTED   Serratia  marcescens NOT DETECTED NOT DETECTED   Haemophilus influenzae NOT DETECTED NOT DETECTED   Neisseria meningitidis NOT DETECTED NOT DETECTED   Pseudomonas aeruginosa NOT DETECTED NOT DETECTED   Stenotrophomonas maltophilia NOT DETECTED NOT DETECTED   Candida albicans NOT DETECTED NOT DETECTED   Candida auris NOT DETECTED NOT DETECTED   Candida glabrata NOT DETECTED NOT DETECTED   Candida krusei NOT DETECTED NOT DETECTED   Candida parapsilosis NOT DETECTED NOT DETECTED   Candida tropicalis NOT DETECTED NOT DETECTED   Cryptococcus neoformans/gattii NOT DETECTED NOT DETECTED   CTX-M ESBL NOT DETECTED NOT DETECTED   Carbapenem resistance IMP NOT DETECTED NOT DETECTED   Carbapenem resistance KPC NOT DETECTED NOT DETECTED   Carbapenem resistance NDM NOT DETECTED NOT DETECTED   Carbapenem resist OXA 48 LIKE NOT DETECTED NOT DETECTED   Carbapenem resistance VIM NOT DETECTED NOT DETECTED   Kendall Pauls PharmD, BCPS [facility] main pharmacy [phone] 08/21/2023 3:41 PM

## 2023-08-21 NOTE — ED Notes (Addendum)
 Pt right hearing aid is inside a labeled specimen cup inside yellow folder in the room next to computer. Pt has the left hearing aid still in his ear

## 2023-08-21 NOTE — Progress Notes (Signed)
 PROGRESS NOTE    PHILLIP TOOPS  YQM:[id number] DOB: [birth year] DOA: 08/20/2023 PCP: Patient, [name]    Brief Narrative:  78 year old with history of renal stones status post bilateral stents 07/13/2023, DM2, HTN, HLD, CAD, paroxysmal A-fib, hypothyroidism, chronic pain, ataxia, GERD, hypothyroidism, chronic anemia comes to the ED for abnormal lab, hypotension and abdominal pain.  Upon admission CT renal stone study done, reviewed by urology showing good stent placement but recommended Foley catheter placement.  Currently started on empiric IV Rocephin .   Assessment & Plan:  Principal Problem:   Severe sepsis (HCC) Active Problems:   Sepsis secondary to UTI (HCC)   Acute metabolic encephalopathy   Hyperkalemia   Hyponatremia   Thrombocytopenia (HCC)   Insulin  dependent type 2 diabetes mellitus (HCC)   Hyperlipidemia   Essential hypertension   Hypothyroidism   Nephrolithiasis   Paroxysmal atrial fibrillation (HCC)   History of CAD (coronary artery disease)   History of ataxia   Bilateral ureteral stent present   Hematuria    Severe sepsis Sepsis likely secondary from UTI Bilateral nephrolithiasis s/p lithotripsy and ureteral stent 07/13/2023 Hematuria Concerns of urinary tract infection.  No obvious evidence of obstruction.  Foley catheter placed in the ER per urology recommendations.  For now continue IV fluids, empiric antibiotics and de-escalate as appropriate.    Thrombocytopenia Likely in setting of sepsis.  Closely monitor   Hyponatremia Continue IV fluids   Hyperkalemia Improved after Lokelma   Acute kidney injury - Baseline creatinine 0.7, admission creatinine 3.17.  Foley catheter placed, IV fluids.  Slowly improving   Acute metabolic encephalopathy-multifactorial - Suspect combination of polypharmacy and underlying infection.  Continue to monitor her.  Did receive Narcan  in the ER     Metabolic acidosis  -Low bicarb 17.  Normal anion gap 14.   Metabolic acidosis in the setting of AKI and lactic acidosis.  Continue to treat for AKI and lactic acidosis.   Paroxysmal atrial fibrillation -Holding Coreg  in the setting of hypotension.  At home patient is not on any anticoagulant.   History of CAD -Holding aspirin  and Plavix  in the setting of hematuria and new development of thrombocytopenia.  Will resume once platelet count will be improved   Essential hypertension -Holding home blood pressure regimen in the setting of sepsis.  IV as needed   Hypothyroidism -Continue levothyroxine    Insulin -dependent DM type II - A1c 9.4.  Sliding scale and Accu-Chek.  Semglee  10 units.     Generalized anxiety disorder - Continue Zoloft  and Wellbutrin     DVT prophylaxis: SCD's    Code Status: Full Code Family Communication:   Status is: Inpatient Cont hosp stay for IVF    Subjective: Drwosy but no complaints. Answers all the basic questions    Examination:  General exam: Appears calm and comfortable  Respiratory system: Clear to auscultation. Respiratory effort normal. Cardiovascular system: S1 & S2 heard, RRR. No JVD, murmurs, rubs, gallops or clicks. No pedal edema. Gastrointestinal system: Abdomen is nondistended, soft and nontender. No organomegaly or masses felt. Normal bowel sounds heard. Central nervous system: Alert and oriented. No focal neurological deficits. Extremities: Symmetric 5 x 5 power. Skin: No rashes, lesions or ulcers Psychiatry: Judgement and insight appear normal. Mood & affect appropriate. Foley in place.                Diet Orders (From admission, onward)     Start     Ordered   08/21/23 0035  Diet heart healthy/carb  modified Room service appropriate? Yes; Fluid consistency: Thin  Diet effective now       Question Answer Comment  Diet-HS Snack? Nothing   Room service appropriate? Yes   Fluid consistency: Thin      08/21/23 0034            Objective: Vitals:   08/21/23 0645  08/21/23 0658 08/21/23 0715 08/21/23 0900  BP:  112/70 (!) 110/59 (!) 110/48  Pulse: (!) 103  (!) 102 90  Resp: (!) 24  11 18   Temp:  98.2 F (36.8 C)  97.9 F (36.6 C)  TempSrc:  Oral  Oral  SpO2: 96%  95% 97%  Weight:    120.5 kg  Height:    5\' 9"  (1.753 m)    Intake/Output Summary (Last 24 hours) at 08/21/2023 1225 Last data filed at 08/21/2023 0457 Gross per 24 hour  Intake 4200 ml  Output 300 ml  Net 3900 ml   Filed Weights   08/21/23 0406 08/21/23 0900  Weight: 111.1 kg 120.5 kg    Scheduled Meds:  buPROPion   150 mg Oral Daily   Chlorhexidine  Gluconate Cloth  6 each Topical Daily   insulin  aspart  0-5 Units Subcutaneous QHS   insulin  aspart  0-6 Units Subcutaneous TID WC   insulin  glargine-yfgn  10 Units Subcutaneous Daily   levothyroxine   50 mcg Oral QAC breakfast   sertraline   150 mg Oral Daily   sodium chloride  flush  3 mL Intravenous Q12H   sodium chloride  flush  3 mL Intravenous Q12H   tamsulosin  0.4 mg Oral QPM   Continuous Infusions:  sodium chloride      sodium chloride  100 mL/hr at 08/21/23 1158   ceFEPime  (MAXIPIME ) IV Stopped (08/21/23 0137)   [START ON 08/23/2023] vancomycin      Nutritional status     Body mass index is 39.23 kg/m.  Data Reviewed:   CBC: Recent Labs  Lab 08/20/23 2145 08/21/23 0429  WBC 11.1* 10.9*  NEUTROABS 10.0*  --   HGB 11.9* 11.5*  HCT 36.2* 34.7*  MCV 95.3 95.6  PLT 89* 73*  75*   Basic Metabolic Panel: Recent Labs  Lab 08/18/23 0647 08/20/23 2145 08/21/23 0051 08/21/23 0429  NA 136 129* 128* 130*  K 4.2 5.5* 5.0 4.8  CL 104 97* 97* 98  CO2 24 18* 17* 21*  GLUCOSE 190* 107* 76 93  BUN 12 55* 52* 53*  CREATININE 0.74 3.17* 2.87* 2.70*  CALCIUM  9.5 8.5* 8.0* 8.0*   GFR: Estimated Creatinine Clearance: 30.3 mL/min (A) (by C-G formula based on SCr of 2.7 mg/dL (H)). Liver Function Tests: Recent Labs  Lab 08/20/23 2145 08/21/23 0429  AST 28 23  ALT 21 18  ALKPHOS 63 67  BILITOT 0.7 0.9  PROT  5.9* 5.5*  ALBUMIN 2.8* 2.5*   No results for input(s): "LIPASE", "AMYLASE" in the last 168 hours. Recent Labs  Lab 08/21/23 1019  AMMONIA 16   Coagulation Profile: Recent Labs  Lab 08/20/23 2145 08/21/23 0429 08/21/23 0747  INR 1.2 1.2 1.2   Cardiac Enzymes: No results for input(s): "CKTOTAL", "CKMB", "CKMBINDEX", "TROPONINI" in the last 168 hours. BNP (last 3 results) No results for input(s): "PROBNP" in the last 8760 hours. HbA1C: No results for input(s): "HGBA1C" in the last 72 hours. CBG: Recent Labs  Lab 08/18/23 0646 08/18/23 1004 08/20/23 2150 08/21/23 0841 08/21/23 1127  GLUCAP 184* 196* 101* 89 88   Lipid Profile: No results for input(s): "CHOL", "HDL", "  LDLCALC", "TRIG", "CHOLHDL", "LDLDIRECT" in the last 72 hours. Thyroid  Function Tests: No results for input(s): "TSH", "T4TOTAL", "FREET4", "T3FREE", "THYROIDAB" in the last 72 hours. Anemia Panel: Recent Labs    08/21/23 1019  VITAMINB12 225   Sepsis Labs: Recent Labs  Lab 08/21/23 0033 08/21/23 0329 08/21/23 0429 08/21/23 0747  LATICACIDVEN 4.8* 3.0* 3.2* 2.2*    Recent Results (from the past 240 hours)  Blood Culture (routine x 2)     Status: None (Preliminary result)   Collection Time: 08/20/23  9:53 PM   Specimen: BLOOD LEFT FOREARM  Result Value Ref Range Status   Specimen Description   Final    BLOOD LEFT FOREARM Performed at [facility], [address] [place], [place] [address]    Special Requests   Final    BOTTLES DRAWN AEROBIC AND ANAEROBIC Blood Culture results may not be optimal due to an inadequate volume of blood received in culture bottles Performed at [facility], [address], [place], [place] [address]    Culture   Final    NO GROWTH < 12 HOURS Performed at [facility], [address] [place], [place] [address]    Report Status PENDING  Incomplete  Blood Culture (routine x 2)     Status: None (Preliminary result)   Collection  Time: 08/20/23  9:53 PM   Specimen: BLOOD RIGHT HAND  Result Value Ref Range Status   Specimen Description   Final    BLOOD RIGHT HAND Performed at [facility], [address] [place], [place] [address]    Special Requests   Final    BOTTLES DRAWN AEROBIC AND ANAEROBIC Blood Culture adequate volume Performed at [facility], [address], [place], [place] [address]    Culture   Final    NO GROWTH < 12 HOURS Performed at [facility], [address] [place], [place] [address]    Report Status PENDING  Incomplete         Radiology Studies: CT Renal Stone Study Result Date: 08/20/2023 CLINICAL DATA:  Bilateral flank pain, hematuria. Recent lithotripsy. EXAM: CT ABDOMEN AND PELVIS WITHOUT CONTRAST TECHNIQUE: Multidetector CT imaging of the abdomen and pelvis was performed following the standard protocol without IV contrast. RADIATION DOSE REDUCTION: This exam was performed according to the departmental dose-optimization program which includes automated exposure control, adjustment of the mA and/or kV according to patient size and/or use of iterative reconstruction technique. COMPARISON:  07/13/2023 FINDINGS: Lower chest: Bibasilar atelectasis. No effusions. Coronary artery and aortic atherosclerosis. Hepatobiliary: No focal liver abnormality is seen. Status post cholecystectomy. No biliary dilatation. Pancreas: No focal abnormality or ductal dilatation. Spleen: No focal abnormality.  Normal size. Adrenals/Urinary Tract: Adrenal glands normal. Bilateral ureteral stents are in place. Air within the renal collecting systems bilaterally and bladder. Probable blood clot in the right renal pelvis. Previously seen ureteral stones no longer visualized. There is also air noted along the periphery of the left kidney midpole of unknown etiology. No focal fluid collection in the region to suggest abscess. Stomach/Bowel: Normal appendix. Scattered colonic diverticulosis,  most pronounced in the sigmoid colon. No active diverticulitis. Stomach and small bowel decompressed. Vascular/Lymphatic: Aortic atherosclerosis. No evidence of aneurysm or adenopathy. Reproductive: No visible focal abnormality. Other: No free fluid or free air. Musculoskeletal: No acute bony abnormality. Postoperative and degenerative changes in the lumbar spine. IMPRESSION: Interval placement of bilateral ureteral stents. Previously seen proximal ureteral stones no longer  visualized. Air noted within the collecting systems and bladder likely related to recent stent placement. Probable blood clot within the right renal pelvis. No hydronephrosis. Air also noted along the surface of the left kidney at the midpole of unknown etiology. No fluid collection to suggest abscess. Aortic atherosclerosis. Colonic diverticulosis. Electronically Signed   By: Janeece Mechanic M.D.   On: 08/20/2023 22:58   DG Chest Port 1 View Result Date: 08/20/2023 CLINICAL DATA:  Sepsis EXAM: PORTABLE CHEST 1 VIEW COMPARISON:  Chest x-ray 08/01/2022 FINDINGS: The heart size and mediastinal contours are within normal limits. Both lungs are clear. No acute fractures are seen. Cervical spinal fusion plate is present. IMPRESSION: No active disease. Electronically Signed   By: Tyron Gallon M.D.   On: 08/20/2023 22:03           LOS: 0 days   Time spent= 35 mins    Maggie Schooner, MD [facility]  If 7PM-7AM, please contact night-coverage  08/21/2023, 12:25 PM

## 2023-08-21 NOTE — H&P (Signed)
 History and Physical    [name] UJW:[id number] DOB: 04-Jul-1945 DOA: 08/20/2023  PCP: Patient, [name]   Patient coming from: SNF   Chief Complaint:  Chief Complaint  Patient presents with   Post-op Problem   ED TRIAGE note:  Patient arrived with complaints of bilateral flank pain and hematuria. Patient had a lithotripsy two days ago but with continued pain. Facility noted elevated BUN and WBC and concerned so sent for evaluation.      HPI:  [name] is a 78 y.o. male with medical history significant of nephrolithiasis status post bilateral ureteral stent placement 07/13/2023, insulin -dependent DM type II, essential hypertension, hyperlipidemia, CAD, paroxysmal atrial fibrillation, hypothyroidism, chronic back pain, ataxia, GERD, hypothyroidism, morbid obesity and chronic anemia presented to emergency department from nursing home with complaining of abnormal lab, hypotension, abdominal pain and left-sided upper leg pain. He is sleepy on arrival, given narcan  and appears somewhat more awake. He is hard of hearing.  Denies (and facility confirms) nausea, vomiting, diarrhea, black or bloody stools, known fevers.  He reports he has had some mild cough.  Denies chest pain or shortness of breath.  Facility reports he has been using his pain medications, which appear to be tramadol  on his MAR.  He reports he is not sure why they sent him here. He does know he is at [facility].  Per facility, they checked labs today which found his chemistries to be out of whack and then found him to have low blood pressure and sent him to the ED.  ED Course:  At presentation to ED patient is hypotensive blood pressure 88/46 otherwise hemodynamically stable. Elevated lactic acid 4.7 and 4.4. CMP showing low sodium 129, elevated potassium 5.5, low bicarb 18, low chloride 97, elevated creatinine 3.17 low albumin 2.8 and GFR 20. CBC showing leukocytosis 11.1, stable H&H and low platelet count 89. Normal pro  time INR. UA showing evidence of UTI.  Pending urine culture and blood culture.   Chest x-ray no active disease process.  CT renal stone study: Interval placement of bilateral ureteral stents. Previously seen proximal ureteral stones no longer visualized. Air noted within the collecting systems and bladder likely related to recent stent placement. Probable blood clot within the right renal pelvis. No hydronephrosis. Air also noted along the surface of the left kidney at the midpole of unknown etiology. No fluid collection to suggest abscess.   In the ED patient has been evaluated by urology Dr. Derrick Fling.  Urology reviewed CT scan, stents are in good position.  Recommended patient need simple Foley catheter placed to max draining of the system and medical resuscitation.  Foley already has been placed in the ED.  With concern for sepsis in the setting of UTI in the ED patient has been treated with LR 2 L of bolus, ceftriaxone  1 g.  Treated with insulin  and dextrose  for management of hyperkalemia. Also after giving Narcan  patient is more alert and oriented.  Hospitalist has been consulted management of acute metabolic encephalopathy in the setting of pain medication use, sepsis secondary to UTI, hyperkalemia, hyponatremia, acute kidney injury and thrombocytopenia.  Significant labs in the ED: Lab Orders         Blood Culture (routine x 2)         Urine Culture         Comprehensive metabolic panel         CBC with Differential         Protime-INR  Urinalysis, w/ Reflex to Culture (Infection Suspected) -Urine, Clean Catch         Blood gas, arterial         Comprehensive metabolic panel         CBC         Basic metabolic panel         Osmolality         Sodium, urine, random         Osmolality, urine         Lactic acid, plasma         DIC Panel ONCE - STAT         I-Stat Lactic Acid, ED         CBG monitoring, ED         I-Stat CG4 Lactic Acid       Review of Systems:   Review of Systems  Unable to perform ROS: Acuity of condition    Past Medical History:  Diagnosis Date   Abnormal stress test    Anxiety    Arthritis    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HCC) 03/14/2013   Atypical chest pain 12/28/2013   CAD in native artery 03/14/2013   Cervical disc disorder at C5-C6 level with radiculopathy 06/09/2022   Cervical radiculopathy at C5 06/10/2022   Chest pain 07/30/2011   1999 Cath - <50% stenosis 2008 NUC - low risk    Chronic back pain    herniated disc   Coronary artery disease    takes Plavix  and ASA daily   Depression    takes Prozac  daily   Diabetes mellitus type 2, controlled, with complications (HCC) 07/30/2011   Displacement of lumbar intervertebral disc without myelopathy 05/30/2014   Diverticulitis    hx of   Dizziness    occasionally and states Dr.Smith is aware   DJD (degenerative joint disease)    Essential hypertension, benign 07/30/2011   Fatigue 09/06/2019   GERD (gastroesophageal reflux disease)    takes an OTC antacid   Gout    yrs ago and doesn't take any meds   Hard of hearing    wears hearing aids   Headache(784.0)    occasionally   History of colon polyps    benign   History of fusion of cervical spine 09/04/2020   Last Assessment & Plan:   Formatting of this note might be different from the original.  Review of recent cervical spine CT on care everywhere notes ACDF from C5-7 without hardware complication.  At this time he is doing well without any symptomatic issues.   History of kidney stones    HNP (herniated nucleus pulposus), lumbar 06/14/2014   Hyperlipidemia    takes Atorvastatin  daily   Hypertension    takes Amlodipine ,Losartan ,and Coreg  daily   Hypothyroidism    takes Synthroid  daily   IBS (irritable bowel syndrome)    Lumbar post-laminectomy syndrome 09/04/2020   Last Assessment & Plan:   Formatting of this note might be different from the original.   78 year old male with chronic low back pain history of multiple lumbar spine surgeries, he has had a multilevel decompression and is fused from L4 to the sacrum.  His lumbar MRI completed in 2017 shows improvement compared with prior status post left L3-4 diskectomy in March of 2016 there is mild residual mass   Nontraumatic complete tear of right rotator cuff 06/09/2022   Obesity, unspecified 07/30/2011   Other spondylosis, lumbar region 02/12/2021  Pain medication agreement 09/04/2020   Last Assessment & Plan:   Formatting of this note might be different from the original.  Agreement up-to-date.  Patient and I have discussed the hazardous effects of continued opiate pain medication usage. Risks and benefits of above medications including but not limited to possibility of respiratory depression, sedation, and even death were discussed with the patient who expressed an understandin   PAT (paroxysmal atrial tachycardia) (HCC) 10/12/2020   Preoperative cardiovascular examination 09/06/2019   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   Right sided weakness 06/04/2022   Shortness of breath dyspnea    with exertion   Swelling of lower limb 06/20/2014   Tuberculosis    Patient denies Tb   Type II diabetes mellitus (HCC)    takes Metformin  daily   Unstable angina (HCC) 12/27/2013   Weakness    numbness and tingling in left leg    Past Surgical History:  Procedure Laterality Date   ANTERIOR CERVICAL DECOMP/DISCECTOMY FUSION  04/2007   C4-5; C6-7   BACK SURGERY     "3 times; last time was screws in lower back 10/2008"   CHOLECYSTECTOMY  1970's   COLONOSCOPY     CORONARY ANGIOPLASTY  1999/2013   1 stent   CYSTOSCOPY     CYSTOSCOPY/RETROGRADE/URETEROSCOPY/STONE EXTRACTION WITH BASKET Bilateral 07/13/2023   Procedure: CYSTOSCOPY, BILATERAL URETERAL STENT PLACEMENT, BILATERAL RETROGRADE PYELOGRAM;  Surgeon: Mallie Seal, MD;  Location: [facility] OR;  Service: Urology;  Laterality:  Bilateral;   CYSTOSCOPY/URETEROSCOPY/HOLMIUM LASER/STENT PLACEMENT Bilateral 08/18/2023   Procedure: CYSTOSCOPY/URETEROSCOPY/HOLMIUM LASER/STENT PLACEMENT;  Surgeon: Mallie Seal, MD;  Location: [facility] OR;  Service: Urology;  Laterality: Bilateral;   LEFT HEART CATH AND CORONARY ANGIOGRAPHY N/A 09/30/2019   Procedure: LEFT HEART CATH AND CORONARY ANGIOGRAPHY;  Surgeon: Lucendia Rusk, MD;  Location: [facility] INVASIVE CV LAB;  Service: Cardiovascular;  Laterality: N/A;   LEFT HEART CATHETERIZATION WITH CORONARY ANGIOGRAM N/A 08/21/2011   Procedure: LEFT HEART CATHETERIZATION WITH CORONARY ANGIOGRAM;  Surgeon: Mickiel Albany, MD;  Location: [facility] CATH LAB;  Service: Cardiovascular;  Laterality: N/A;   LITHOTRIPSY     LUMBAR LAMINECTOMY/DECOMPRESSION MICRODISCECTOMY Left 06/14/2014   Procedure: LEFT LUMBAR THREE-FOUR LANINOTOMY AND MICRODISKECTOMY.;  Surgeon: Corrina Dimitri, MD;  Location: [facility] NEURO ORS;  Service: Neurosurgery;  Laterality: Left;  left   PCI  5/09/2-13   LAD   RADIOLOGY WITH ANESTHESIA N/A 11/13/2015   Procedure: MRI OF LUMBAR SPINE W/WO CONTRAST    (RADIOLOGY WITH ANESTHESIA);  Surgeon: Medication Radiologist, MD;  Location: [facility] OR;  Service: Radiology;  Laterality: N/A;   RADIOLOGY WITH ANESTHESIA N/A 06/05/2022   Procedure: MRI WITH ANESTHESIA;  Surgeon: Radiologist, Medication, MD;  Location: [facility] OR;  Service: Radiology;  Laterality: N/A;   RADIOLOGY WITH ANESTHESIA N/A 06/08/2022   Procedure: MRI WITH ANESTHESIA;  Surgeon: Radiologist, Medication, MD;  Location: [facility] OR;  Service: Radiology;  Laterality: N/A;   SHOULDER ARTHROSCOPY W/ ROTATOR CUFF REPAIR  ~ 2011   left     reports that he has quit smoking. His smokeless tobacco use includes chew. He reports that he does not drink alcohol and does not use drugs.  Allergies  Allergen Reactions   Trimethoprim    Misc. Sulfonamide Containing Compounds Rash   Sulfa Antibiotics Rash    Family History  Problem Relation Age of Onset    Cancer Father        colon   Arthritis Mother    Healthy Sister    Heart  attack Brother    Heart disease Brother    Hypertension Brother    Healthy Sister     Prior to Admission medications   Medication Sig Start Date End Date Taking? Authorizing Provider  acetaminophen  (TYLENOL ) 500 MG tablet Take 1,000 mg by mouth in the morning, at noon, and at bedtime.   Yes [provider]  amLODipine  (NORVASC ) 5 MG tablet Take 1 tablet (5 mg total) by mouth daily. 08/08/22  Yes Dahal, Aminta Baldy, MD  aspirin  81 MG tablet Take 81 mg by mouth daily.   Yes [provider]  atorvastatin  (LIPITOR ) 80 MG tablet Take 80 mg by mouth daily. 11/01/21  Yes [provider]  buPROPion  (WELLBUTRIN  XL) 150 MG 24 hr tablet Take 150 mg by mouth daily.   Yes [provider]  carvedilol  (COREG ) 6.25 MG tablet Take 6.25 mg by mouth 2 (two) times daily with a meal.   Yes [provider]  Cholecalciferol  25 MCG (1000 UT) tablet Take 1,000 Units by mouth daily.   Yes [provider]  clopidogrel  (PLAVIX ) 75 MG tablet Take 75 mg by mouth daily with breakfast.   Yes [provider]  Cranberry 450 MG TABS Take 1 tablet by mouth daily.   Yes [provider]  diclofenac  Sodium (VOLTAREN ) 1 % GEL Apply 2 g topically every 12 (twelve) hours as needed.   Yes [provider]  docusate sodium  (COLACE) 100 MG capsule Take 1 capsule (100 mg total) by mouth 2 (two) times daily as needed for mild constipation. 08/07/22  Yes Dahal, Aminta Baldy, MD  hydroxypropyl methylcellulose / hypromellose (ISOPTO TEARS / GONIOVISC) 2.5 % ophthalmic solution Place 2 drops into both eyes every 4 (four) hours as needed for dry eyes.   Yes [provider]  hyoscyamine  (LEVSIN/SL) 0.125 MG SL tablet Place 1 tablet (0.125 mg total) under the tongue every 6 (six) hours as needed (bladder spasms). 08/18/23  Yes Mallie Seal, MD  insulin  lispro (HUMALOG) 100 UNIT/ML injection Inject  0-9 Units into the skin in the morning and at bedtime. Sliding Scale: 0-150: 0 151-200: 2 201-250: 3 251-300: 5 301-350: 7 351-400: 9   Yes [provider]  levofloxacin (LEVAQUIN) 250 MG tablet Take 250 mg by mouth 3 (three) times a week.   Yes [provider]  levothyroxine  (SYNTHROID , LEVOTHROID) 50 MCG tablet Take 50 mcg by mouth daily before breakfast. 02/26/15  Yes [provider]  loratadine (CLARITIN) 10 MG tablet Take 10 mg by mouth daily.   Yes [provider]  LORazepam  (ATIVAN ) 1 MG tablet Take 1 tablet (1 mg total) by mouth every 12 (twelve) hours as needed for anxiety. Patient taking differently: Take 1 mg by mouth at bedtime. 07/17/23 07/16/24 Yes Pokhrel, Laxman, MD  losartan  (COZAAR ) 50 MG tablet Take 50 mg by mouth daily.   Yes [provider]  meloxicam  (MOBIC ) 15 MG tablet Take 1 tablet (15 mg total) by mouth daily for 10 days. 08/18/23 08/28/23 Yes Mallie Seal, MD  metFORMIN  (GLUCOPHAGE ) 1000 MG tablet Take 1,000 mg by mouth 2 (two) times daily with a meal.   Yes [provider]  nitrofurantoin , macrocrystal-monohydrate, (MACROBID ) 100 MG capsule Take 1 capsule (100 mg total) by mouth 2 (two) times daily for 7 days. 08/18/23 08/25/23 Yes Mallie Seal, MD  nitroGLYCERIN  (NITROSTAT ) 0.4 MG SL tablet Place 0.4 mg under the tongue every 5 (five) minutes as needed for chest pain.   Yes [provider]  ondansetron  (ZOFRAN ) 8 MG tablet Take 8 mg by mouth every 6 (six) hours as needed for nausea or vomiting.   Yes [provider]  oxyCODONE  (OXY IR/ROXICODONE ) 5 MG immediate release tablet Take 5 mg by mouth every 6 (six) hours as needed for severe pain (pain score 7-10).   Yes [provider]  polyethylene glycol powder (GLYCOLAX /MIRALAX ) 17 GM/SCOOP powder Take 17 g by mouth in the morning.   Yes [provider]  saccharomyces boulardii (FLORASTOR) 250 MG capsule Take 250 mg by mouth daily.    Yes [provider]  sennosides-docusate sodium  (SENOKOT-S) 8.6-50 MG tablet Take 1 tablet by mouth in the morning and at bedtime.   Yes [provider]  Sertraline  HCl 150 MG CAPS Take 150 mg by mouth daily.   Yes [provider]  shark liver oil-cocoa butter (PREPARATION H) 0.25-88.44 % suppository Place 1 suppository rectally every 12 (twelve) hours as needed for hemorrhoids.   Yes [provider]  simethicone (MYLICON) 125 MG chewable tablet Chew 125 mg by mouth every 6 (six) hours as needed for flatulence.   Yes [provider]  tamsulosin (FLOMAX) 0.4 MG CAPS capsule Take 0.4 mg by mouth daily.   Yes [provider]  traMADol  (ULTRAM ) 50 MG tablet Take 1 tablet (50 mg total) by mouth every 8 (eight) hours as needed for moderate pain (pain score 4-6) or severe pain (pain score 7-10). Patient taking differently: Take 100 mg by mouth every 8 (eight) hours as needed for moderate pain (pain score 4-6) or severe pain (pain score 7-10). 08/18/23 08/17/24 Yes Mallie Seal, MD  traZODone (DESYREL) 100 MG tablet Take 100 mg by mouth at bedtime.   Yes [provider]     Physical Exam: Vitals:   08/20/23 2230 08/21/23 0155 08/21/23 0300 08/21/23 0406  BP: 108/61  107/67   Pulse: 93  (!) 101   Resp: (!) 22  (!) 24   Temp:  97.9 F (36.6 C)    TempSrc:  Oral    SpO2: 94%  99%   Weight:    111.1 kg  Height:    5\' 9"  (1.753 m)    Physical Exam Constitutional:      Appearance: He is obese. He is ill-appearing.  HENT:     Mouth/Throat:     Mouth: Mucous membranes are dry.  Cardiovascular:     Rate and Rhythm: Normal rate and regular rhythm.     Pulses: Normal pulses.     Heart sounds: Normal heart sounds.  Pulmonary:     Effort: Pulmonary effort is normal.     Breath sounds: Normal breath sounds.  Abdominal:     Palpations: Abdomen is soft.     Tenderness: There is no abdominal tenderness. There is no guarding.   Musculoskeletal:     Cervical back: Neck supple.  Skin:    General: Skin is dry.     Capillary Refill: Capillary refill takes less than 2 seconds.  Neurological:     Mental Status: He is alert.     Comments: Patient is alert and oriented to self  Psychiatric:     Comments: Unable to assess      Labs on Admission: I have personally reviewed following labs and imaging studies  CBC: Recent Labs  Lab 08/20/23 2145  WBC 11.1*  NEUTROABS 10.0*  HGB 11.9*  HCT 36.2*  MCV 95.3  PLT 89*   Basic Metabolic Panel: Recent Labs  Lab  08/18/23 0647 08/20/23 2145 08/21/23 0051  NA 136 129* 128*  K 4.2 5.5* 5.0  CL 104 97* 97*  CO2 24 18* 17*  GLUCOSE 190* 107* 76  BUN 12 55* 52*  CREATININE 0.74 3.17* 2.87*  CALCIUM  9.5 8.5* 8.0*   GFR: Estimated Creatinine Clearance: 27.3 mL/min (A) (by C-G formula based on SCr of 2.87 mg/dL (H)). Liver Function Tests: Recent Labs  Lab 08/20/23 2145  AST 28  ALT 21  ALKPHOS 63  BILITOT 0.7  PROT 5.9*  ALBUMIN 2.8*   No results for input(s): "LIPASE", "AMYLASE" in the last 168 hours. No results for input(s): "AMMONIA" in the last 168 hours. Coagulation Profile: Recent Labs  Lab 08/20/23 2145  INR 1.2   Cardiac Enzymes: No results for input(s): "CKTOTAL", "CKMB", "CKMBINDEX", "TROPONINI", "TROPONINIHS" in the last 168 hours. BNP (last 3 results) No results for input(s): "BNP" in the last 8760 hours. HbA1C: No results for input(s): "HGBA1C" in the last 72 hours. CBG: Recent Labs  Lab 08/18/23 0646 08/18/23 1004 08/20/23 2150  GLUCAP 184* 196* 101*   Lipid Profile: No results for input(s): "CHOL", "HDL", "LDLCALC", "TRIG", "CHOLHDL", "LDLDIRECT" in the last 72 hours. Thyroid  Function Tests: No results for input(s): "TSH", "T4TOTAL", "FREET4", "T3FREE", "THYROIDAB" in the last 72 hours. Anemia Panel: No results for input(s): "VITAMINB12", "FOLATE", "FERRITIN", "TIBC", "IRON", "RETICCTPCT" in the last 72 hours. Urine  analysis:    Component Value Date/Time   COLORURINE BROWN (A) 08/20/2023 2236   APPEARANCEUR TURBID (A) 08/20/2023 2236   LABSPEC 1.021 08/20/2023 2236   PHURINE 5.0 08/20/2023 2236   GLUCOSEU NEGATIVE 08/20/2023 2236   HGBUR LARGE (A) 08/20/2023 2236   BILIRUBINUR NEGATIVE 08/20/2023 2236   KETONESUR NEGATIVE 08/20/2023 2236   PROTEINUR 100 (A) 08/20/2023 2236   UROBILINOGEN 0.2 10/23/2011 0731   NITRITE NEGATIVE 08/20/2023 2236   LEUKOCYTESUR MODERATE (A) 08/20/2023 2236    Radiological Exams on Admission: I have personally reviewed images CT Renal Stone Study Result Date: 08/20/2023 CLINICAL DATA:  Bilateral flank pain, hematuria. Recent lithotripsy. EXAM: CT ABDOMEN AND PELVIS WITHOUT CONTRAST TECHNIQUE: Multidetector CT imaging of the abdomen and pelvis was performed following the standard protocol without IV contrast. RADIATION DOSE REDUCTION: This exam was performed according to the departmental dose-optimization program which includes automated exposure control, adjustment of the mA and/or kV according to patient size and/or use of iterative reconstruction technique. COMPARISON:  07/13/2023 FINDINGS: Lower chest: Bibasilar atelectasis. No effusions. Coronary artery and aortic atherosclerosis. Hepatobiliary: No focal liver abnormality is seen. Status post cholecystectomy. No biliary dilatation. Pancreas: No focal abnormality or ductal dilatation. Spleen: No focal abnormality.  Normal size. Adrenals/Urinary Tract: Adrenal glands normal. Bilateral ureteral stents are in place. Air within the renal collecting systems bilaterally and bladder. Probable blood clot in the right renal pelvis. Previously seen ureteral stones no longer visualized. There is also air noted along the periphery of the left kidney midpole of unknown etiology. No focal fluid collection in the region to suggest abscess. Stomach/Bowel: Normal appendix. Scattered colonic diverticulosis, most pronounced in the sigmoid colon. No  active diverticulitis. Stomach and small bowel decompressed. Vascular/Lymphatic: Aortic atherosclerosis. No evidence of aneurysm or adenopathy. Reproductive: No visible focal abnormality. Other: No free fluid or free air. Musculoskeletal: No acute bony abnormality. Postoperative and degenerative changes in the lumbar spine. IMPRESSION: Interval placement of bilateral ureteral stents. Previously seen proximal ureteral stones no longer visualized. Air noted within the collecting systems and bladder likely related to recent stent placement. Probable blood  clot within the right renal pelvis. No hydronephrosis. Air also noted along the surface of the left kidney at the midpole of unknown etiology. No fluid collection to suggest abscess. Aortic atherosclerosis. Colonic diverticulosis. Electronically Signed   By: Janeece Mechanic M.D.   On: 08/20/2023 22:58   DG Chest Port 1 View Result Date: 08/20/2023 CLINICAL DATA:  Sepsis EXAM: PORTABLE CHEST 1 VIEW COMPARISON:  Chest x-ray 08/01/2022 FINDINGS: The heart size and mediastinal contours are within normal limits. Both lungs are clear. No acute fractures are seen. Cervical spinal fusion plate is present. IMPRESSION: No active disease. Electronically Signed   By: Tyron Gallon M.D.   On: 08/20/2023 22:03     EKG: My personal interpretation of EKG shows:     Assessment/Plan: Principal Problem:   Severe sepsis (HCC) Active Problems:   Sepsis secondary to UTI (HCC)   Acute metabolic encephalopathy   Hyperkalemia   Hyponatremia   Thrombocytopenia (HCC)   Insulin  dependent type 2 diabetes mellitus (HCC)   Hyperlipidemia   Essential hypertension   Hypothyroidism   Nephrolithiasis   Paroxysmal atrial fibrillation (HCC)   History of CAD (coronary artery disease)   History of ataxia   Bilateral ureteral stent present   Hematuria    Assessment and Plan: Severe sepsis Sepsis likely secondary from UTI Bilateral nephrolithiasis s/p lithotripsy and ureteral  stent 07/13/2023 Hematuria -Patient presented to emergency department altered mental status and confusion, confusion and abnormal workup at nursing facility.   Further workup in the ED revealed patient is hypotensive, elevated lactic acid 4.7, leukocytosis.  UA showed of UTI. -CBC showing leukocytosis, thrombocytopenia 89 and stable H&H. CMP showed low sodium 129, elevated potassium 5.5, low bicarb 18, elevated creatinine 3.17 and low albumin 2.8.  Elevated lactic acid 4.8. - Patient has bilateral nephrolithiasis post ureteral stent in March 2025.  CT abdomen pelvis showed stent are in place.   -In the setting of hypotension, leukocytosis, lactic acidosis and having a source of infection patient meets criteria for sepsis. -In the ED patient has been evaluated by urology recommended to place in Foley catheter and stabilize patient hemodynamically.   Urology will evaluate patient in the morning for formal consult. - In ED patient has been given 3 L of LR bolus and ceftriaxone  2 g. -After giving 3 L of LR bolus lactic acid level has been improved 4.8-3. - In the setting of sepsis starting IV vancomycin and cefepime  with pharmacy consult.   Will  de-escalate antibiotic once blood culture and urine culture will be resulted. -Continue to trend lactic acid level.  Continue maintenance fluid NS 125 cc/h. Checking DIC panel  Thrombocytopenia -Low platelet count 89 in the sepsis.  In the setting of hematuria holding aspirin  and Plavix  and pharmacological DVT prophylaxis.  Hyponatremia -Low sodium 129.  UA showed evidence of UTI. -Continue NS 125 cc/h.  Hyperkalemia -Elevated potassium 5.5.  In ED patient is treated with insulin  and dextrose .  Giving Lokelma 10 g.  Acute kidney injury -Elevated creatinine 3.17.  Prerenal acute kidney injury in the setting of sepsis, underlying nephrolithiasis. - Continue maintenance fluid, monitor renal function and-nephrotoxic agent. Monitor urine output  Acute  metabolic encephalopathy-multifactorial -Patient presented emergency department with confusion.  At nursing home he has been taking tramadol  with combination of trazodone, Zoloft  and bupropion .  after giving Narcan  in the ED patient is more awake however still lethargic however able to maintaining his airway. - ABG unremarkable. -During evaluation at the bedside patient is alert but  not completely oriented.  Patient also has very unusual pattern of breathing.  While asleep patient stops taking deep breathing for 1 to 2 seconds and then breathes again.  However maintaining O2 sat 100% on 2 L oxygen.  Concern for underlying OSA.  Need to avoid further narcotics to worsen OSA. -Continue Narcan  as needed for reversal.    Metabolic acidosis  -Low bicarb 17.  Normal anion gap 14.  Metabolic acidosis in the setting of AKI and lactic acidosis.  Continue to treat for AKI and lactic acidosis.  Paroxysmal atrial fibrillation -Holding Coreg  in the setting of hypotension.  At home patient is not on any anticoagulant.  History of CAD -Holding aspirin  and Plavix  in the setting of hematuria and new development of thrombocytopenia.  Will resume once platelet count will be improved  Essential hypertension -Holding home blood pressure regimen in the setting of sepsis.  Hypothyroidism -Continue levothyroxine   Insulin -dependent DM type II -A1c 9.5.  At home patient is not any long-acting insulin . Holding metformin  in the setting of AKI. - Starting Lantus  10 units and continue sliding scale SSI -Consult inpatient diabetic educator.   Generalized anxiety disorder - Continue Zoloft  and Wellbutrin   DVT prophylaxis:  SCDs.  Deferring pharmacological DVT prophylaxis in the setting of thrombocytopenia and hematuria. Code Status:  Full Code Diet: Heart healthy/carb modified Disposition Plan: Continue to monitor improvement of confusion/mental status.  Pending blood culture and urine cultures result. Consults:  Urology Admission status:   Inpatient, Step Down Unit  Severity of Illness: The appropriate patient status for this patient is INPATIENT. Inpatient status is judged to be reasonable and necessary in order to provide the required intensity of service to ensure the patient's safety. The patient's presenting symptoms, physical exam findings, and initial radiographic and laboratory data in the context of their chronic comorbidities is felt to place them at high risk for further clinical deterioration. Furthermore, it is not anticipated that the patient will be medically stable for discharge from the hospital within 2 midnights of admission.   * I certify that at the point of admission it is my clinical judgment that the patient will require inpatient hospital care spanning beyond 2 midnights from the point of admission due to high intensity of service, high risk for further deterioration and high frequency of surveillance required.Aaron Aas    Nollie Shiflett, MD [facility]  How to contact the [facility] Attending or Consulting provider 7A - 7P or covering provider during after hours 7P -7A, for this patient.  Check the care team in [facility] and look for a) attending/consulting [facility] provider listed and b) the [facility] team listed Log into [email or web address] and use [facility]'s universal password to access. If you do not have the password, please contact the hospital operator. Locate the [facility] provider you are looking for under [facility] and page to a number that you can be directly reached. If you still have difficulty reaching the provider, please page the [facility] (Director on Call) for the Hospitalists listed on amion for assistance.  08/21/2023, 4:38 AM

## 2023-08-21 NOTE — ED Notes (Signed)
 Family member called to get update on patient. Patient apparently called sister and told her there has been no one to see him and he was not aware of what was going on. I informed family member that both me and the previous nurse have explained to the patient he was being admitted due to urinary problems and would be getting antibiotics. Family member stated he has been having issues since they busted up his kidney stones and was just wanting to check in on patient. She said she would be up later today and thanked Clinical research associate for the update.

## 2023-08-21 NOTE — ED Notes (Signed)
 Pt. I-stat Lactic Acid CG4  results 3.0, EDP, Candelaria Chaco, MD made aware.

## 2023-08-22 DIAGNOSIS — R6520 Severe sepsis without septic shock: Secondary | ICD-10-CM | POA: Diagnosis not present

## 2023-08-22 DIAGNOSIS — A419 Sepsis, unspecified organism: Secondary | ICD-10-CM | POA: Diagnosis not present

## 2023-08-22 LAB — BASIC METABOLIC PANEL WITH GFR
Anion gap: 12 (ref 5–15)
BUN: 59 mg/dL — ABNORMAL HIGH (ref 8–23)
CO2: 17 mmol/L — ABNORMAL LOW (ref 22–32)
Calcium: 7.7 mg/dL — ABNORMAL LOW (ref 8.9–10.3)
Chloride: 103 mmol/L (ref 98–111)
Creatinine, Ser: 2.26 mg/dL — ABNORMAL HIGH (ref 0.61–1.24)
GFR, Estimated: 30 mL/min — ABNORMAL LOW (ref >60)
Glucose, Bld: 105 mg/dL — ABNORMAL HIGH (ref 70–99)
Potassium: 4.6 mmol/L (ref 3.5–5.1)
Sodium: 132 mmol/L — ABNORMAL LOW (ref 135–145)

## 2023-08-22 LAB — CBC
HCT: 38.3 % — ABNORMAL LOW (ref 39.0–52.0)
Hemoglobin: 11.9 g/dL — ABNORMAL LOW (ref 13.0–17.0)
MCH: 30.7 pg (ref 26.0–34.0)
MCHC: 31.1 g/dL (ref 30.0–36.0)
MCV: 98.7 fL (ref 80.0–100.0)
Platelets: 63 10*3/uL — ABNORMAL LOW (ref 150–400)
RBC: 3.88 MIL/uL — ABNORMAL LOW (ref 4.22–5.81)
RDW: 14.1 % (ref 11.5–15.5)
WBC: 7.8 10*3/uL (ref 4.0–10.5)
nRBC: 0 % (ref 0.0–0.2)

## 2023-08-22 LAB — URINE CULTURE: Culture: <10000 — AB

## 2023-08-22 LAB — MAGNESIUM: Magnesium: 1.4 mg/dL — ABNORMAL LOW (ref 1.7–2.4)

## 2023-08-22 LAB — GLUCOSE, CAPILLARY
Glucose-Capillary: 110 mg/dL — ABNORMAL HIGH (ref 70–99)
Glucose-Capillary: 153 mg/dL — ABNORMAL HIGH (ref 70–99)
Glucose-Capillary: 188 mg/dL — ABNORMAL HIGH (ref 70–99)
Glucose-Capillary: 168 mg/dL — ABNORMAL HIGH (ref 70–99)

## 2023-08-22 LAB — FOLATE: Folate: 4.2 ng/mL — ABNORMAL LOW (ref >5.9)

## 2023-08-22 LAB — PHOSPHORUS: Phosphorus: 3.3 mg/dL (ref 2.5–4.6)

## 2023-08-22 MED ORDER — MAGNESIUM SULFATE 2 GM/50ML IV SOLN
2.0000 g | Freq: Once | INTRAVENOUS | Status: AC
  Administered 2023-08-22: 2 g via INTRAVENOUS
  Filled 2023-08-22: qty 50

## 2023-08-22 MED ORDER — FENTANYL CITRATE PF 50 MCG/ML IJ SOSY: 25.0000 ug | PREFILLED_SYRINGE | Freq: Once | INTRAMUSCULAR | Status: DC

## 2023-08-22 MED ORDER — LIDOCAINE HCL URETHRAL/MUCOSAL 2 % EX GEL
1.0000 | Freq: Once | CUTANEOUS | Status: AC
  Administered 2023-08-22: 1 via URETHRAL
  Filled 2023-08-22 (×2): qty 5

## 2023-08-22 MED ORDER — SODIUM CHLORIDE 0.9 % IV SOLN: INTRAVENOUS | Status: AC

## 2023-08-22 MED ORDER — FOLIC ACID 1 MG PO TABS
1.0000 mg | ORAL_TABLET | Freq: Every day | ORAL | Status: DC
  Administered 2023-08-22 – 2023-09-03 (×13): 1 mg via ORAL
  Filled 2023-08-22 (×13): qty 1

## 2023-08-22 NOTE — Plan of Care (Signed)
  Problem: Nutritional: Goal: Maintenance of adequate nutrition will improve Outcome: Progressing   Problem: Clinical Measurements: Goal: Cardiovascular complication will be avoided Outcome: Progressing   Problem: Coping: Goal: Level of anxiety will decrease Outcome: Progressing   Problem: Clinical Measurements: Goal: Respiratory complications will improve Outcome: Not Progressing   Problem: Pain Managment: Goal: General experience of comfort will improve and/or be controlled Outcome: Not Progressing

## 2023-08-22 NOTE — Evaluation (Signed)
 Physical Therapy Evaluation Patient Details Name: [name] MRN: [id number] DOB: December 28, 1945 Today's Date: 08/22/2023  History of Present Illness  [name] is a 77 y/o male admitted from [facility] 08/20/23 with flank pain, severe sepsis 2* UTI and acute metabolic encephalopathy. Pt with recent admit to [facility] with kidney stones and s/p bil ureteral stent placement 07/13/23.  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  Clinical Impression  Pt admitted as above and presenting with functional mobility limitations 2* generalized weakness, flank pain, deconditioning, obesity, and balance deficits.  Pt states has been working with rehab at [facility] and would benefit from return to same.      If plan is discharge home, recommend the following:     Can travel by private vehicle   No    Equipment Recommendations None recommended by PT  Recommendations for Other Services       Functional Status Assessment Patient has had a recent decline in their functional status and demonstrates the ability to make significant improvements in function in a reasonable and predictable amount of time.     Precautions / Restrictions Precautions Precautions: Fall Restrictions Weight Bearing Restrictions Per Provider Order: No      Mobility  Bed Mobility Overal bed mobility: Needs Assistance Bed Mobility: Rolling, Sidelying to Sit Rolling: Mod assist Sidelying to sit: Max assist, +2 for physical assistance       General bed mobility comments: Increased time and extensive assist for body habitus and L flank pain    Transfers Overall transfer level: Needs assistance Equipment used: Rolling walker (2 wheels) Transfers: Sit to/from Stand, Bed to chair/wheelchair/BSC Sit to Stand: Mod assist, +2 physical assistance, +2 safety/equipment, From elevated surface   Step pivot  transfers: +2 physical assistance, +2 safety/equipment, From elevated surface, Mod assist       General transfer comment: Assist to bring wt up and fwd and to balance with RW; Step pvt bed to chair for lunch    Ambulation/Gait               General Gait Details: Step pvt bed to chair only  Stairs            Wheelchair Mobility     Tilt Bed    Modified Rankin (Stroke Patients Only)       Balance Overall balance assessment: Needs assistance Sitting-balance support: Feet supported, No upper extremity supported Sitting balance-Leahy Scale: Fair     Standing balance support: Bilateral upper extremity supported Standing balance-Leahy Scale: Poor                               Pertinent Vitals/Pain Pain Assessment Pain Assessment: Faces Faces Pain Scale: Hurts even more Pain Location: L flank Pain Descriptors / Indicators: Discomfort Pain Intervention(s): Limited activity within patient's tolerance, Monitored during session    Home Living Family/patient expects to be discharged to:: Skilled nursing facility                   Additional Comments: pt has been residing at [facility] facility for over a year. He has family in the area but has no home of his own.    Prior Function Prior Level of Function : Needs assist       Physical Assist : Mobility (physical);ADLs (physical) Mobility (physical): Transfers;Gait;Stairs ADLs (physical):  IADLs;Toileting;Dressing Mobility Comments: At the SNF, pt normally sits in a w/c and will transfer out of it and intermittently walk around using RW. ADLs Comments: Pt reports he needs assistance dressing himself, toileting, and bathing.     Extremity/Trunk Assessment   Upper Extremity Assessment Upper Extremity Assessment: RUE deficits/detail;LUE deficits/detail RUE Deficits / Details: decreased active shoulder flexion - pt not able to comb hair.    Lower Extremity Assessment Lower Extremity Assessment:  Generalized weakness    Cervical / Trunk Assessment Cervical / Trunk Assessment: Other exceptions Cervical / Trunk Exceptions: obese pendulous abdomen  Communication   Communication Communication: Impaired Factors Affecting Communication: Hearing impaired    Cognition Arousal: Alert Behavior During Therapy: Flat affect, WFL for tasks assessed/performed   PT - Cognitive impairments: No apparent impairments                         Following commands: Impaired Following commands impaired: Follows one step commands inconsistently     Cueing Cueing Techniques: Verbal cues, Gestural cues, Tactile cues     General Comments      Exercises     Assessment/Plan    PT Assessment Patient needs continued PT services  PT Problem List Decreased strength;Decreased range of motion;Decreased activity tolerance;Decreased balance;Decreased mobility;Decreased knowledge of use of DME;Obesity;Pain       PT Treatment Interventions DME instruction;Gait training;Functional mobility training;Therapeutic activities;Therapeutic exercise;Patient/family education;Cognitive remediation;Balance training    PT Goals (Current goals can be found in the Care Plan section)  Acute Rehab PT Goals Patient Stated Goal: continue rehab to get strong enough to go home PT Goal Formulation: With patient Time For Goal Achievement: 09/05/23 Potential to Achieve Goals: Fair    Frequency Min 2X/week     Co-evaluation PT/OT/SLP Co-Evaluation/Treatment: Yes Reason for Co-Treatment: For patient/therapist safety;To address functional/ADL transfers PT goals addressed during session: Mobility/safety with mobility OT goals addressed during session: ADL's and self-care       AM-PAC PT "6 Clicks" Mobility  Outcome Measure Help needed turning from your back to your side while in a flat bed without using bedrails?: A Lot Help needed moving from lying on your back to sitting on the side of a flat bed without  using bedrails?: A Lot Help needed moving to and from a bed to a chair (including a wheelchair)?: A Lot Help needed standing up from a chair using your arms (e.g., wheelchair or bedside chair)?: A Lot Help needed to walk in hospital room?: Total Help needed climbing 3-5 steps with a railing? : Total 6 Click Score: 10    End of Session Equipment Utilized During Treatment: Gait belt Activity Tolerance: Patient limited by fatigue;Patient limited by pain Patient left: in chair;with call bell/phone within reach;with chair alarm set Nurse Communication: Mobility status PT Visit Diagnosis: Unsteadiness on feet (R26.81);Muscle weakness (generalized) (M62.81);Difficulty in walking, not elsewhere classified (R26.2);Pain Pain - Right/Left: Left Pain - part of body:  (flank)    Time: 1405-1430 PT Time Calculation (min) (ACUTE ONLY): 25 min   Charges:   PT Evaluation $PT Eval Low Complexity: 1 Low   PT General Charges $$ ACUTE PT VISIT: 1 Visit         Thedora Finlay PT Acute Rehabilitation Services Pager [phone] Office [phone]   [facility] 08/22/2023, 4:42 PM

## 2023-08-22 NOTE — Plan of Care (Signed)

## 2023-08-22 NOTE — Progress Notes (Signed)
 PROGRESS NOTE    [name]  WUJ:[id number] DOB: [birth year] DOA: 08/20/2023 PCP: Patient, [name]    Brief Narrative:  78 year old with history of renal stones status post bilateral stents 07/13/2023, DM2, HTN, HLD, CAD, paroxysmal A-fib, hypothyroidism, chronic pain, ataxia, GERD, hypothyroidism, chronic anemia comes to the ED for abnormal lab, hypotension and abdominal pain.  Upon admission CT renal stone study done, reviewed by urology showing good stent placement but recommended Foley catheter placement.  Currently started on empiric IV Rocephin .   Assessment & Plan:  Principal Problem:   Severe sepsis (HCC) Active Problems:   Sepsis secondary to UTI (HCC)   Acute metabolic encephalopathy   Hyperkalemia   Hyponatremia   Thrombocytopenia (HCC)   Insulin  dependent type 2 diabetes mellitus (HCC)   Hyperlipidemia   Essential hypertension   Hypothyroidism   Nephrolithiasis   Paroxysmal atrial fibrillation (HCC)   History of CAD (coronary artery disease)   History of ataxia   Bilateral ureteral stent present   Hematuria    Severe sepsis Sepsis likely secondary from UTI Bilateral nephrolithiasis s/p lithotripsy and ureteral stent 07/13/2023 Hematuria Concerns of urinary tract infection.  Sepsis have improved.  No obvious evidence of obstruction.  Foley catheter placed in the ER per urology recommendations.  Continue IV fluids, cultures growing E. coli.  Continue IV Rocephin     Thrombocytopenia Likely in setting of sepsis.  Closely monitor   Hyponatremia Continue IV fluids   Hyperkalemia Improved after Lokelma  Folic acid deficiency - Folate supplement started   Acute kidney injury - Baseline creatinine 0.7, admission creatinine 3.17.  Foley catheter placed, IV fluids.  Slowly improving   Acute metabolic encephalopathy-multifactorial - Suspected polypharmacy versus infection.  Improved after Narcan  in ER     Metabolic acidosis  -Low bicarb 17.  Normal anion  gap 14.  Metabolic acidosis in the setting of AKI and lactic acidosis.  Continue to treat for AKI and lactic acidosis.   Paroxysmal atrial fibrillation -Holding Coreg  in the setting of hypotension.  At home patient is not on any anticoagulant.   History of CAD -Holding aspirin  and Plavix  in the setting of hematuria and new development of thrombocytopenia.  Will resume once platelet count will be improved   Essential hypertension -Holding home blood pressure regimen in the setting of sepsis.  IV as needed   Hypothyroidism -Continue levothyroxine    Insulin -dependent DM type II - A1c 9.4.  Sliding scale and Accu-Chek.  Discontinue long-acting due to intermittent borderline low normal blood glucose     Generalized anxiety disorder - Continue Zoloft  and Wellbutrin     DVT prophylaxis: SCD's    Code Status: Full Code Family Communication:   Status is: Inpatient Cont hosp stay for IVF, renal function slow to improve Transfer to MedSurg    Subjective: Drwosy but no complaints. Answers all the basic questions    Examination:  General exam: Appears calm and comfortable  Respiratory system: Clear to auscultation. Respiratory effort normal. Cardiovascular system: S1 & S2 heard, RRR. No JVD, murmurs, rubs, gallops or clicks. No pedal edema. Gastrointestinal system: Abdomen is nondistended, soft and nontender. No organomegaly or masses felt. Normal bowel sounds heard. Central nervous system: Alert and oriented. No focal neurological deficits. Extremities: Symmetric 5 x 5 power. Skin: No rashes, lesions or ulcers Psychiatry: Judgement and insight appear normal. Mood & affect appropriate. Foley in place.                Diet Orders (From admission, onward)  Start     Ordered   08/21/23 0035  Diet heart healthy/carb modified Room service appropriate? Yes; Fluid consistency: Thin  Diet effective now       Question Answer Comment  Diet-HS Snack? Nothing   Room service  appropriate? Yes   Fluid consistency: Thin      08/21/23 0034            Objective: Vitals:   08/22/23 0600 08/22/23 0700 08/22/23 0800 08/22/23 0900  BP: (!) 104/56 (!) 106/44 (!) 104/55 (!) 111/48  Pulse: 90 92 93 93  Resp: 18 16 15 14   Temp:    (!) 97.5 F (36.4 C)  TempSrc:    Oral  SpO2: 97% 96% 95% 98%  Weight:      Height:        Intake/Output Summary (Last 24 hours) at 08/22/2023 1042 Last data filed at 08/22/2023 0730 Gross per 24 hour  Intake 1152.67 ml  Output 1210 ml  Net -57.33 ml   Filed Weights   08/21/23 0406 08/21/23 0900  Weight: 111.1 kg 120.5 kg    Scheduled Meds:  buPROPion   150 mg Oral Daily   Chlorhexidine  Gluconate Cloth  6 each Topical Daily   docusate sodium   100 mg Oral BID   fentaNYL  (SUBLIMAZE ) injection  25 mcg Intravenous Once   folic acid  1 mg Oral Daily   insulin  aspart  0-5 Units Subcutaneous QHS   insulin  aspart  0-6 Units Subcutaneous TID WC   levothyroxine   50 mcg Oral QAC breakfast   sertraline   150 mg Oral Daily   sodium chloride  flush  3 mL Intravenous Q12H   sodium chloride  flush  3 mL Intravenous Q12H   tamsulosin  0.4 mg Oral QPM   Continuous Infusions:  sodium chloride  100 mL/hr at 08/22/23 0841   cefTRIAXone  (ROCEPHIN )  IV Stopped (08/21/23 1708)    Nutritional status     Body mass index is 39.23 kg/m.  Data Reviewed:   CBC: Recent Labs  Lab 08/20/23 2145 08/21/23 0429 08/22/23 0257  WBC 11.1* 10.9* 7.8  NEUTROABS 10.0*  --   --   HGB 11.9* 11.5* 11.9*  HCT 36.2* 34.7* 38.3*  MCV 95.3 95.6 98.7  PLT 89* 73*  75* 63*   Basic Metabolic Panel: Recent Labs  Lab 08/18/23 0647 08/20/23 2145 08/21/23 0051 08/21/23 0429 08/22/23 0257  NA 136 129* 128* 130* 132*  K 4.2 5.5* 5.0 4.8 4.6  CL 104 97* 97* 98 103  CO2 24 18* 17* 21* 17*  GLUCOSE 190* 107* 76 93 105*  BUN 12 55* 52* 53* 59*  CREATININE 0.74 3.17* 2.87* 2.70* 2.26*  CALCIUM  9.5 8.5* 8.0* 8.0* 7.7*  MG  --   --   --   --  1.4*   PHOS  --   --   --   --  3.3   GFR: Estimated Creatinine Clearance: 36.2 mL/min (A) (by C-G formula based on SCr of 2.26 mg/dL (H)). Liver Function Tests: Recent Labs  Lab 08/20/23 2145 08/21/23 0429  AST 28 23  ALT 21 18  ALKPHOS 63 67  BILITOT 0.7 0.9  PROT 5.9* 5.5*  ALBUMIN 2.8* 2.5*   No results for input(s): "LIPASE", "AMYLASE" in the last 168 hours. Recent Labs  Lab 08/21/23 1019  AMMONIA 16   Coagulation Profile: Recent Labs  Lab 08/20/23 2145 08/21/23 0429 08/21/23 0747  INR 1.2 1.2 1.2   Cardiac Enzymes: No results for input(s): "CKTOTAL", "CKMB", "CKMBINDEX", "  TROPONINI" in the last 168 hours. BNP (last 3 results) No results for input(s): "PROBNP" in the last 8760 hours. HbA1C: No results for input(s): "HGBA1C" in the last 72 hours. CBG: Recent Labs  Lab 08/21/23 0841 08/21/23 1127 08/21/23 1604 08/21/23 2123 08/22/23 0847  GLUCAP 89 88 95 103* 110*   Lipid Profile: No results for input(s): "CHOL", "HDL", "LDLCALC", "TRIG", "CHOLHDL", "LDLDIRECT" in the last 72 hours. Thyroid  Function Tests: No results for input(s): "TSH", "T4TOTAL", "FREET4", "T3FREE", "THYROIDAB" in the last 72 hours. Anemia Panel: Recent Labs    08/21/23 1019 08/22/23 0257  VITAMINB12 225  --   FOLATE  --  4.2*   Sepsis Labs: Recent Labs  Lab 08/21/23 0033 08/21/23 0329 08/21/23 0429 08/21/23 0747  LATICACIDVEN 4.8* 3.0* 3.2* 2.2*    Recent Results (from the past 240 hours)  Blood Culture (routine x 2)     Status: None (Preliminary result)   Collection Time: 08/20/23  9:53 PM   Specimen: BLOOD LEFT FOREARM  Result Value Ref Range Status   Specimen Description   Final    BLOOD LEFT FOREARM Performed at [facility], [address], [place], [place] [address]    Special Requests   Final    BOTTLES DRAWN AEROBIC AND ANAEROBIC Blood Culture results may not be optimal due to an inadequate volume of blood received in culture bottles Performed at [facility], [address], [place], [place] [address]    Culture  Setup Time   Final    GRAM NEGATIVE RODS IN BOTH AEROBIC AND ANAEROBIC BOTTLES CRITICAL VALUE NOTED.  VALUE IS CONSISTENT WITH PREVIOUSLY REPORTED AND CALLED VALUE. Performed at [facility], [address], [place], [place] [address]    Culture GRAM NEGATIVE RODS  Final   Report Status PENDING  Incomplete  Blood Culture (routine x 2)     Status: Abnormal (Preliminary result)   Collection Time: 08/20/23  9:53 PM   Specimen: BLOOD RIGHT HAND  Result Value Ref Range Status   Specimen Description   Final    BLOOD RIGHT HAND Performed at [facility], [address] [place], [place] [address]    Special Requests   Final    BOTTLES DRAWN AEROBIC AND ANAEROBIC Blood Culture adequate volume Performed at [facility], [address] [place], [place] [address]    Culture  Setup Time   Final    GRAM NEGATIVE RODS IN BOTH AEROBIC AND ANAEROBIC BOTTLES CRITICAL RESULT CALLED TO, READ BACK BY AND VERIFIED WITH: PHARMD CHRISTINE SHADE [phone] [id number] BY EC    Culture (A)  Final    ESCHERICHIA COLI SUSCEPTIBILITIES TO FOLLOW Performed at [facility], [address], [place], [place] [address]    Report Status PENDING  Incomplete  Blood Culture ID Panel (Reflexed)     Status: Abnormal   Collection Time: 08/20/23  9:53 PM  Result Value Ref Range Status   Enterococcus faecalis NOT DETECTED NOT DETECTED Final   Enterococcus Faecium NOT DETECTED NOT DETECTED Final   Listeria monocytogenes NOT DETECTED NOT DETECTED Final   Staphylococcus species NOT DETECTED NOT DETECTED Final   Staphylococcus aureus (BCID) NOT DETECTED NOT DETECTED Final   Staphylococcus epidermidis NOT DETECTED NOT DETECTED Final   Staphylococcus lugdunensis NOT DETECTED NOT DETECTED Final   Streptococcus species NOT DETECTED NOT DETECTED Final   Streptococcus agalactiae NOT DETECTED NOT DETECTED Final    Streptococcus pneumoniae NOT DETECTED NOT  DETECTED Final   Streptococcus pyogenes NOT DETECTED NOT DETECTED Final   A.calcoaceticus-baumannii NOT DETECTED NOT DETECTED Final   Bacteroides fragilis NOT DETECTED NOT DETECTED Final   Enterobacterales DETECTED (A) NOT DETECTED Final    Comment: Enterobacterales represent a large order of gram negative bacteria, not a single organism. CRITICAL RESULT CALLED TO, READ BACK BY AND VERIFIED WITH: PHARMD CHRISTINE SHADE [id number] AT 1516 BY EC    Enterobacter cloacae complex NOT DETECTED NOT DETECTED Final   Escherichia coli DETECTED (A) NOT DETECTED Final    Comment: CRITICAL RESULT CALLED TO, READ BACK BY AND VERIFIED WITH: PHARMD CHRISTINE SHADE [phone] AT 1516 BY EC    Klebsiella aerogenes NOT DETECTED NOT DETECTED Final   Klebsiella oxytoca NOT DETECTED NOT DETECTED Final   Klebsiella pneumoniae NOT DETECTED NOT DETECTED Final   Proteus species NOT DETECTED NOT DETECTED Final   Salmonella species NOT DETECTED NOT DETECTED Final   Serratia marcescens NOT DETECTED NOT DETECTED Final   Haemophilus influenzae NOT DETECTED NOT DETECTED Final   Neisseria meningitidis NOT DETECTED NOT DETECTED Final   Pseudomonas aeruginosa NOT DETECTED NOT DETECTED Final   Stenotrophomonas maltophilia NOT DETECTED NOT DETECTED Final   Candida albicans NOT DETECTED NOT DETECTED Final   Candida auris NOT DETECTED NOT DETECTED Final   Candida glabrata NOT DETECTED NOT DETECTED Final   Candida krusei NOT DETECTED NOT DETECTED Final   Candida parapsilosis NOT DETECTED NOT DETECTED Final   Candida tropicalis NOT DETECTED NOT DETECTED Final   Cryptococcus neoformans/gattii NOT DETECTED NOT DETECTED Final   CTX-M ESBL NOT DETECTED NOT DETECTED Final   Carbapenem resistance IMP NOT DETECTED NOT DETECTED Final   Carbapenem resistance KPC NOT DETECTED NOT DETECTED Final   Carbapenem resistance NDM NOT DETECTED NOT DETECTED Final   Carbapenem resist OXA 48 LIKE NOT  DETECTED NOT DETECTED Final   Carbapenem resistance VIM NOT DETECTED NOT DETECTED Final    Comment: Performed at [facility], [address], [place], [place] [address]  Urine Culture     Status: Abnormal   Collection Time: 08/20/23 10:36 PM   Specimen: Urine, Random  Result Value Ref Range Status   Specimen Description   Final    URINE, RANDOM Performed at [facility], [address] [place], [place] [address]    Special Requests   Final    NONE Reflexed from [phone] Performed at [facility], [address] [place], [place] [address]    Culture (A)  Final    <10,000 COLONIES/mL INSIGNIFICANT GROWTH Performed at [facility], [address] [place], [place] [address]    Report Status 08/22/2023 FINAL  Final         Radiology Studies: CT Renal Stone Study Result Date: 08/20/2023 CLINICAL DATA:  Bilateral flank pain, hematuria. Recent lithotripsy. EXAM: CT ABDOMEN AND PELVIS WITHOUT CONTRAST TECHNIQUE: Multidetector CT imaging of the abdomen and pelvis was performed following the standard protocol without IV contrast. RADIATION DOSE REDUCTION: This exam was performed according to the departmental dose-optimization program which includes automated exposure control, adjustment of the mA and/or kV according to patient size and/or use of iterative reconstruction technique. COMPARISON:  07/13/2023 FINDINGS: Lower chest: Bibasilar atelectasis. No effusions. Coronary artery and aortic atherosclerosis. Hepatobiliary: No focal liver abnormality is seen. Status post cholecystectomy. No biliary dilatation. Pancreas: No focal abnormality or ductal dilatation. Spleen: No focal abnormality.  Normal size. Adrenals/Urinary Tract: Adrenal glands normal. Bilateral ureteral stents are in place.  Air within the renal collecting systems bilaterally and bladder. Probable blood clot in the right renal pelvis. Previously seen ureteral stones no longer  visualized. There is also air noted along the periphery of the left kidney midpole of unknown etiology. No focal fluid collection in the region to suggest abscess. Stomach/Bowel: Normal appendix. Scattered colonic diverticulosis, most pronounced in the sigmoid colon. No active diverticulitis. Stomach and small bowel decompressed. Vascular/Lymphatic: Aortic atherosclerosis. No evidence of aneurysm or adenopathy. Reproductive: No visible focal abnormality. Other: No free fluid or free air. Musculoskeletal: No acute bony abnormality. Postoperative and degenerative changes in the lumbar spine. IMPRESSION: Interval placement of bilateral ureteral stents. Previously seen proximal ureteral stones no longer visualized. Air noted within the collecting systems and bladder likely related to recent stent placement. Probable blood clot within the right renal pelvis. No hydronephrosis. Air also noted along the surface of the left kidney at the midpole of unknown etiology. No fluid collection to suggest abscess. Aortic atherosclerosis. Colonic diverticulosis. Electronically Signed   By: Janeece Mechanic M.D.   On: 08/20/2023 22:58   DG Chest Port 1 View Result Date: 08/20/2023 CLINICAL DATA:  Sepsis EXAM: PORTABLE CHEST 1 VIEW COMPARISON:  Chest x-ray 08/01/2022 FINDINGS: The heart size and mediastinal contours are within normal limits. Both lungs are clear. No acute fractures are seen. Cervical spinal fusion plate is present. IMPRESSION: No active disease. Electronically Signed   By: Tyron Gallon M.D.   On: 08/20/2023 22:03           LOS: 1 day   Time spent= 35 mins    Maggie Schooner, MD [facility]  If 7PM-7AM, please contact night-coverage  08/22/2023, 10:42 AM

## 2023-08-22 NOTE — Evaluation (Signed)
 Occupational Therapy Evaluation Patient Details Name: [name] MRN: [id number] DOB: [birth year] Today's Date: 08/22/2023   History of Present Illness   Mr. Geib is a 78 y/o male admitted from [facility] 08/20/23 with flank pain, severe sepsis 2* UTI and acute metabolic encephalopathy. Pt with recent admit to [facility] with kidney stones and s/p bil ureteral stent placement 07/13/23.  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     Clinical Impressions Pt admitted with the above. Pt currently with functional limitations due to the deficits listed below (see OT Problem List).  Pt will benefit from acute skilled OT to increase their safety and independence with ADL and functional mobility for ADL to facilitate discharge.   Pt plans to go back to [facility] for rehab        Functional Status Assessment   Patient has had a recent decline in their functional status and demonstrates the ability to make significant improvements in function in a reasonable and predictable amount of time.     Equipment Recommendations   BSC/3in1      Precautions/Restrictions   Precautions Precautions: Fall     Mobility Bed Mobility Overal bed mobility: Needs Assistance Bed Mobility: Rolling, Sidelying to Sit Rolling: Mod assist Sidelying to sit: Max assist, +2 for physical assistance            Transfers Overall transfer level: Needs assistance Equipment used: Rolling walker (2 wheels) Transfers: Sit to/from Stand, Bed to chair/wheelchair/BSC Sit to Stand: Mod assist, +2 safety/equipment Stand pivot transfers: Mod assist, +2 safety/equipment                Balance Overall balance assessment: Mild deficits observed, not formally tested                                         ADL either performed or assessed with clinical judgement   ADL  Overall ADL's : Needs assistance/impaired Eating/Feeding: Minimal assistance;Sitting Eating/Feeding Details (indicate cue type and reason): needs A with set up due to shoulder limitations Grooming: Wash/dry face;Oral care;Minimal assistance   Upper Body Bathing: Moderate assistance;Sitting   Lower Body Bathing: Maximal assistance;Sit to/from stand;Cueing for safety;Cueing for sequencing   Upper Body Dressing : Moderate assistance;Sitting   Lower Body Dressing: Sit to/from stand;Maximal assistance   Toilet Transfer: Moderate assistance;+2 for safety/equipment   Toileting- Clothing Manipulation and Hygiene: Maximal assistance;Sit to/from stand               Vision Patient Visual Report: No change from baseline              Pertinent Vitals/Pain Pain Assessment Pain Assessment: Faces Pain Score: 6  Pain Location: left side with bed mobility Pain Descriptors / Indicators: Discomfort Pain Intervention(s): Limited activity within patient's tolerance, Monitored during session     Extremity/Trunk Assessment Upper Extremity Assessment Upper Extremity Assessment: RUE deficits/detail;LUE deficits/detail RUE Deficits / Details: decreased active shoulder flexion - pt not able to comb hair.           Communication     Cognition Arousal: Alert Behavior During Therapy: Flat affect Cognition: No apparent impairments  Home Living Family/patient expects to be discharged to:: Skilled nursing facility                                 Additional Comments: pt has been residing at [facility] facility for over a year. He has family in the area but has no home of his own.      Prior Functioning/Environment Prior Level of Function : Needs assist       Physical Assist : Mobility (physical);ADLs (physical) Mobility (physical): Transfers;Gait;Stairs ADLs (physical): IADLs;Toileting;Dressing Mobility  Comments: At the SNF, pt normally sits in a w/c and will transfer out of it and intermittently walk around using RW.      OT Problem List: Decreased strength;Decreased activity tolerance;Impaired balance (sitting and/or standing);Decreased safety awareness   OT Treatment/Interventions: Self-care/ADL training;Patient/family education      OT Goals(Current goals can be found in the care plan section)   Acute Rehab OT Goals Patient Stated Goal: back to [facility] OT Goal Formulation: With patient Time For Goal Achievement: 09/05/23 Potential to Achieve Goals: Good ADL Goals Pt Will Perform Lower Body Bathing: (P) with min assist;sit to/from stand Pt Will Transfer to Toilet: (P) with min assist;ambulating;bedside commode Pt Will Perform Toileting - Clothing Manipulation and hygiene: (P) with min assist;sit to/from stand   OT Frequency:  Min 1X/week    Co-evaluation   Reason for Co-Treatment: For patient/therapist safety;To address functional/ADL transfers PT goals addressed during session: Mobility/safety with mobility OT goals addressed during session: ADL's and self-care      AM-PAC OT "6 Clicks" Daily Activity     Outcome Measure Help from another person eating meals?: A Little Help from another person taking care of personal grooming?: A Little Help from another person toileting, which includes using toliet, bedpan, or urinal?: A Lot Help from another person bathing (including washing, rinsing, drying)?: A Lot Help from another person to put on and taking off regular upper body clothing?: A Lot Help from another person to put on and taking off regular lower body clothing?: A Lot 6 Click Score: 14   End of Session Equipment Utilized During Treatment: Rolling walker (2 wheels);Gait belt Nurse Communication: Mobility status  Activity Tolerance: Patient limited by fatigue Patient left: in chair;with call bell/phone within reach;with chair alarm set  OT Visit Diagnosis:  Unsteadiness on feet (R26.81);Other abnormalities of gait and mobility (R26.89);Muscle weakness (generalized) (M62.81)                Time: 0215-0233 OT Time Calculation (min): 18 min Charges:  OT General Charges $OT Visit: 1 Visit OT Evaluation $OT Eval Low Complexity: 1 Low   Mitzi Lilja, Berline Brenner 08/22/2023, 4:36 PM

## 2023-08-23 DIAGNOSIS — A419 Sepsis, unspecified organism: Secondary | ICD-10-CM

## 2023-08-23 DIAGNOSIS — R6520 Severe sepsis without septic shock: Secondary | ICD-10-CM | POA: Diagnosis not present

## 2023-08-23 LAB — CBC
HCT: 38 % — ABNORMAL LOW (ref 39.0–52.0)
Hemoglobin: 12.1 g/dL — ABNORMAL LOW (ref 13.0–17.0)
MCH: 30.4 pg (ref 26.0–34.0)
MCHC: 31.8 g/dL (ref 30.0–36.0)
MCV: 95.5 fL (ref 80.0–100.0)
Platelets: 66 10*3/uL — ABNORMAL LOW (ref 150–400)
RBC: 3.98 MIL/uL — ABNORMAL LOW (ref 4.22–5.81)
RDW: 14.5 % (ref 11.5–15.5)
WBC: 8.2 10*3/uL (ref 4.0–10.5)
nRBC: 0 % (ref 0.0–0.2)

## 2023-08-23 LAB — GLUCOSE, CAPILLARY
Glucose-Capillary: 196 mg/dL — ABNORMAL HIGH (ref 70–99)
Glucose-Capillary: 217 mg/dL — ABNORMAL HIGH (ref 70–99)
Glucose-Capillary: 241 mg/dL — ABNORMAL HIGH (ref 70–99)

## 2023-08-23 LAB — CULTURE, BLOOD (ROUTINE X 2): Special Requests: ADEQUATE

## 2023-08-23 LAB — BASIC METABOLIC PANEL WITH GFR
Anion gap: 10 (ref 5–15)
BUN: 72 mg/dL — ABNORMAL HIGH (ref 8–23)
CO2: 17 mmol/L — ABNORMAL LOW (ref 22–32)
Calcium: 7.6 mg/dL — ABNORMAL LOW (ref 8.9–10.3)
Chloride: 103 mmol/L (ref 98–111)
Creatinine, Ser: 2.06 mg/dL — ABNORMAL HIGH (ref 0.61–1.24)
GFR, Estimated: 33 mL/min — ABNORMAL LOW (ref >60)
Glucose, Bld: 229 mg/dL — ABNORMAL HIGH (ref 70–99)
Potassium: 4.4 mmol/L (ref 3.5–5.1)
Sodium: 130 mmol/L — ABNORMAL LOW (ref 135–145)

## 2023-08-23 LAB — MAGNESIUM: Magnesium: 2.3 mg/dL (ref 1.7–2.4)

## 2023-08-23 MED ORDER — INSULIN GLARGINE-YFGN 100 UNIT/ML ~~LOC~~ SOLN
5.0000 [IU] | Freq: Every day | SUBCUTANEOUS | Status: DC
  Administered 2023-08-23: 5 [IU] via SUBCUTANEOUS
  Filled 2023-08-23 (×2): qty 0.05

## 2023-08-23 MED ORDER — POLYETHYLENE GLYCOL 3350 17 G PO PACK
17.0000 g | PACK | Freq: Every day | ORAL | Status: DC
  Administered 2023-08-23 – 2023-08-26 (×3): 17 g via ORAL
  Filled 2023-08-23 (×5): qty 1

## 2023-08-23 MED ORDER — PANTOPRAZOLE SODIUM 40 MG PO TBEC
40.0000 mg | DELAYED_RELEASE_TABLET | Freq: Every day | ORAL | Status: DC
  Administered 2023-08-23 – 2023-09-03 (×12): 40 mg via ORAL
  Filled 2023-08-23 (×12): qty 1

## 2023-08-23 MED ORDER — CHLORHEXIDINE GLUCONATE CLOTH 2 % EX PADS
6.0000 | MEDICATED_PAD | Freq: Every day | CUTANEOUS | Status: DC
  Administered 2023-08-23 – 2023-09-01 (×7): 6 via TOPICAL

## 2023-08-23 NOTE — Progress Notes (Signed)
   Subjective Overall feels weak, still feels he is unable to walk Ongoing penile pain  Physical Exam: BP (!) 142/67 (BP Location: Right Arm)   Pulse (!) 106   Temp 98.3 F (36.8 C)   Resp 18   Ht 5\' 9"  (1.753 m)   Wt 120.5 kg   SpO2 94%   BMI 39.23 kg/m    Constitutional:  Alert and oriented, No acute distress. Respiratory: Normal respiratory effort, no increased work of breathing. GI: Abdomen is soft, non-tender, non-distended Drains: Foley with yellow urine  Laboratory Data: Reviewed in epic Creatinine 2.06(2.26)(2.7) WBC 8.2(7.8) Hematocrit 38 (38.3)   Assessment & Plan:   78 year old comorbid male status post bilateral ureteroscopy, laser lithotripsy, stent placement with Dr. Jarvis Mesa on 08/18/2023 complicated by suspected sepsis from urinary source.  Clinically improving, currently maximally drained with stents in place and Foley catheter.  Overall improving, continue antibiotics, would recommend 2-week course total Likely can remove Foley catheter tomorrow Original plan was to remove stents 7 days postop, will discuss timing with urology team and Dr. Jarvis Mesa tomorrow  I spent 25 total minutes on the floor with greater than 50% spent directly face-to-face with the patient regarding coordination and care of sepsis after bilateral ureteroscopy, Foley catheter, ureteral stent management.   Lawerence Pressman, MD

## 2023-08-23 NOTE — Plan of Care (Signed)

## 2023-08-23 NOTE — Progress Notes (Signed)
 PROGRESS NOTE    [name]  QMV:[id number] DOB: 07/03/1945 DOA: 08/20/2023 PCP: Patient, [name]    Brief Narrative:  78 year old with history of renal stones status post bilateral stents 07/13/2023, DM2, HTN, HLD, CAD, paroxysmal A-fib, hypothyroidism, chronic pain, ataxia, GERD, hypothyroidism, chronic anemia comes to the ED for abnormal lab, hypotension and abdominal pain.  Upon admission CT renal stone study done, reviewed by urology showing good stent placement but recommended Foley catheter placement.  Currently started on empiric IV Rocephin .   Assessment & Plan:  Principal Problem:   Severe sepsis (HCC) Active Problems:   Sepsis secondary to UTI (HCC)   Acute metabolic encephalopathy   Hyperkalemia   Hyponatremia   Thrombocytopenia (HCC)   Insulin  dependent type 2 diabetes mellitus (HCC)   Hyperlipidemia   Essential hypertension   Hypothyroidism   Nephrolithiasis   Paroxysmal atrial fibrillation (HCC)   History of CAD (coronary artery disease)   History of ataxia   Bilateral ureteral stent present   Hematuria    Severe sepsis Sepsis likely secondary from UTI Bilateral nephrolithiasis s/p lithotripsy and ureteral stent 07/13/2023 Hematuria Concerns of urinary tract infection.  Sepsis have improved.  No obvious evidence of obstruction.  Cultures growing E. coli, continue IV Rocephin .  Foley in place at urology request.  Foley catheter and stent management per urology  Acute kidney injury - Baseline creatinine 0.7, admission creatinine 3.17.  Foley catheter placed, IV fluids.  Slowly improving.  Creatinine today 2.06    Thrombocytopenia Likely in setting of sepsis.  Closely monitor No evidence of bleeding   Hyponatremia, stable Continue IV fluids   Hyperkalemia, stable Improved after Lokelma  Folic acid deficiency - Folate supplement started      Acute metabolic encephalopathy-multifactorial - Suspected polypharmacy versus infection.  Improved after  Narcan  in ER     Metabolic acidosis  -Low bicarb 17.  Normal anion gap 14.  Metabolic acidosis in the setting of AKI and lactic acidosis.  Continue to treat for AKI and lactic acidosis.   Paroxysmal atrial fibrillation -Holding Coreg  in the setting of hypotension.  At home patient is not on any anticoagulant.   History of CAD -Holding aspirin  and Plavix  in the setting of hematuria and new development of thrombocytopenia.  Will resume once platelet count will be improved   Essential hypertension -Holding home blood pressure regimen in the setting of sepsis.  IV as needed   Hypothyroidism -Continue levothyroxine    Insulin -dependent DM type II - A1c 9.4.  Sliding scale and Accu-Chek.  Semglee  5 units daily added     Generalized anxiety disorder - Continue Zoloft  and Wellbutrin     DVT prophylaxis: SCD's    Code Status: Full Code Family Communication:   Status is: Inpatient Ongoing medical management as mentioned above.  Thereafter SNF placement    Subjective: Resting comfortably, no complaints at this time   Examination:  General exam: Appears calm and comfortable  Respiratory system: Clear to auscultation. Respiratory effort normal. Cardiovascular system: S1 & S2 heard, RRR. No JVD, murmurs, rubs, gallops or clicks. No pedal edema. Gastrointestinal system: Abdomen is nondistended, soft and nontender. No organomegaly or masses felt. Normal bowel sounds heard. Central nervous system: Alert and oriented. No focal neurological deficits. Extremities: Symmetric 5 x 5 power. Skin: No rashes, lesions or ulcers Psychiatry: Judgement and insight appear normal. Mood & affect appropriate. Foley in place.                Diet Orders (From  admission, onward)     Start     Ordered   08/21/23 0035  Diet heart healthy/carb modified Room service appropriate? Yes; Fluid consistency: Thin  Diet effective now       Question Answer Comment  Diet-HS Snack? Nothing   Room  service appropriate? Yes   Fluid consistency: Thin      08/21/23 0034            Objective: Vitals:   08/22/23 2000 08/23/23 0440 08/23/23 1252 08/23/23 1253  BP: 138/72 (!) 142/67 (!) 156/76 (!) 147/69  Pulse: 93 (!) 106 99 95  Resp: 18 18 17    Temp: 97.8 F (36.6 C) 98.3 F (36.8 C) 98.5 F (36.9 C)   TempSrc: Oral     SpO2: 96% 94% 90%   Weight:      Height:        Intake/Output Summary (Last 24 hours) at 08/23/2023 1314 Last data filed at 08/23/2023 0928 Gross per 24 hour  Intake 358.9 ml  Output 1100 ml  Net -741.1 ml   Filed Weights   08/21/23 0406 08/21/23 0900  Weight: 111.1 kg 120.5 kg    Scheduled Meds:  buPROPion   150 mg Oral Daily   Chlorhexidine  Gluconate Cloth  6 each Topical Daily   docusate sodium   100 mg Oral BID   fentaNYL  (SUBLIMAZE ) injection  25 mcg Intravenous Once   folic acid  1 mg Oral Daily   insulin  aspart  0-5 Units Subcutaneous QHS   insulin  aspart  0-6 Units Subcutaneous TID WC   insulin  glargine-yfgn  5 Units Subcutaneous Daily   levothyroxine   50 mcg Oral QAC breakfast   pantoprazole   40 mg Oral QAC breakfast   sertraline   150 mg Oral Daily   sodium chloride  flush  3 mL Intravenous Q12H   sodium chloride  flush  3 mL Intravenous Q12H   tamsulosin  0.4 mg Oral QPM   Continuous Infusions:  sodium chloride  100 mL/hr at 08/23/23 1118   cefTRIAXone  (ROCEPHIN )  IV 200 mL/hr at 08/22/23 1725    Nutritional status     Body mass index is 39.23 kg/m.  Data Reviewed:   CBC: Recent Labs  Lab 08/20/23 2145 08/21/23 0429 08/22/23 0257 08/23/23 0503  WBC 11.1* 10.9* 7.8 8.2  NEUTROABS 10.0*  --   --   --   HGB 11.9* 11.5* 11.9* 12.1*  HCT 36.2* 34.7* 38.3* 38.0*  MCV 95.3 95.6 98.7 95.5  PLT 89* 73*  75* 63* 66*   Basic Metabolic Panel: Recent Labs  Lab 08/20/23 2145 08/21/23 0051 08/21/23 0429 08/22/23 0257 08/23/23 0503  NA 129* 128* 130* 132* 130*  K 5.5* 5.0 4.8 4.6 4.4  CL 97* 97* 98 103 103  CO2 18* 17*  21* 17* 17*  GLUCOSE 107* 76 93 105* 229*  BUN 55* 52* 53* 59* 72*  CREATININE 3.17* 2.87* 2.70* 2.26* 2.06*  CALCIUM  8.5* 8.0* 8.0* 7.7* 7.6*  MG  --   --   --  1.4* 2.3  PHOS  --   --   --  3.3  --    GFR: Estimated Creatinine Clearance: 39.7 mL/min (A) (by C-G formula based on SCr of 2.06 mg/dL (H)). Liver Function Tests: Recent Labs  Lab 08/20/23 2145 08/21/23 0429  AST 28 23  ALT 21 18  ALKPHOS 63 67  BILITOT 0.7 0.9  PROT 5.9* 5.5*  ALBUMIN 2.8* 2.5*   No results for input(s): "LIPASE", "AMYLASE" in the last 168 hours.  Recent Labs  Lab 08/21/23 1019  AMMONIA 16   Coagulation Profile: Recent Labs  Lab 08/20/23 2145 08/21/23 0429 08/21/23 0747  INR 1.2 1.2 1.2   Cardiac Enzymes: No results for input(s): "CKTOTAL", "CKMB", "CKMBINDEX", "TROPONINI" in the last 168 hours. BNP (last 3 results) No results for input(s): "PROBNP" in the last 8760 hours. HbA1C: No results for input(s): "HGBA1C" in the last 72 hours. CBG: Recent Labs  Lab 08/22/23 0847 08/22/23 1236 08/22/23 1652 08/22/23 2036 08/23/23 0728  GLUCAP 110* 153* 188* 168* 196*   Lipid Profile: No results for input(s): "CHOL", "HDL", "LDLCALC", "TRIG", "CHOLHDL", "LDLDIRECT" in the last 72 hours. Thyroid  Function Tests: No results for input(s): "TSH", "T4TOTAL", "FREET4", "T3FREE", "THYROIDAB" in the last 72 hours. Anemia Panel: Recent Labs    08/21/23 1019 08/22/23 0257  VITAMINB12 225  --   FOLATE  --  4.2*   Sepsis Labs: Recent Labs  Lab 08/21/23 0033 08/21/23 0329 08/21/23 0429 08/21/23 0747  LATICACIDVEN 4.8* 3.0* 3.2* 2.2*    Recent Results (from the past 240 hours)  Blood Culture (routine x 2)     Status: Abnormal   Collection Time: 08/20/23  9:53 PM   Specimen: BLOOD LEFT FOREARM  Result Value Ref Range Status   Specimen Description   Final    BLOOD LEFT FOREARM Performed at [facility], [address], [place], [place] [address]    Special Requests   Final     BOTTLES DRAWN AEROBIC AND ANAEROBIC Blood Culture results may not be optimal due to an inadequate volume of blood received in culture bottles Performed at [facility], [address], [place], [place] [address]    Culture  Setup Time   Final    GRAM NEGATIVE RODS IN BOTH AEROBIC AND ANAEROBIC BOTTLES CRITICAL VALUE NOTED.  VALUE IS CONSISTENT WITH PREVIOUSLY REPORTED AND CALLED VALUE.    Culture (A)  Final    ESCHERICHIA COLI SUSCEPTIBILITIES PERFORMED ON PREVIOUS CULTURE WITHIN THE LAST 5 DAYS. Performed at [facility], [address] [place], [place] [address]    Report Status 08/23/2023 FINAL  Final  Blood Culture (routine x 2)     Status: Abnormal   Collection Time: 08/20/23  9:53 PM   Specimen: BLOOD RIGHT HAND  Result Value Ref Range Status   Specimen Description   Final    BLOOD RIGHT HAND Performed at [facility], [address] [place], [place] [address]    Special Requests   Final    BOTTLES DRAWN AEROBIC AND ANAEROBIC Blood Culture adequate volume Performed at [facility], [address] [place], [place] [address]    Culture  Setup Time   Final    GRAM NEGATIVE RODS IN BOTH AEROBIC AND ANAEROBIC BOTTLES CRITICAL RESULT CALLED TO, READ BACK BY AND VERIFIED WITH: PHARMD CHRISTINE SHADE [phone] [id number] BY EC Performed at [facility], [address], [place], [place] [address]    Culture ESCHERICHIA COLI (A)  Final   Report Status 08/23/2023 FINAL  Final   Organism ID, Bacteria ESCHERICHIA COLI  Final   Organism ID, Bacteria ESCHERICHIA COLI  Final      Susceptibility   Escherichia coli - KIRBY BAUER*    CEFAZOLIN  RESISTANT Resistant    Escherichia coli - MIC*    AMPICILLIN >=32 RESISTANT Resistant     CEFEPIME  <=0.12 SENSITIVE Sensitive     CEFTAZIDIME <=1 SENSITIVE Sensitive     CEFTRIAXONE  <=  0.25 SENSITIVE Sensitive     CIPROFLOXACIN  >=4 RESISTANT Resistant     GENTAMICIN <=1 SENSITIVE Sensitive      IMIPENEM <=0.25 SENSITIVE Sensitive     TRIMETH/SULFA <=20 SENSITIVE Sensitive     AMPICILLIN/SULBACTAM >=32 RESISTANT Resistant     PIP/TAZO 8 SENSITIVE Sensitive ug/mL    * ESCHERICHIA COLI    ESCHERICHIA COLI  Blood Culture ID Panel (Reflexed)     Status: Abnormal   Collection Time: 08/20/23  9:53 PM  Result Value Ref Range Status   Enterococcus faecalis NOT DETECTED NOT DETECTED Final   Enterococcus Faecium NOT DETECTED NOT DETECTED Final   Listeria monocytogenes NOT DETECTED NOT DETECTED Final   Staphylococcus species NOT DETECTED NOT DETECTED Final   Staphylococcus aureus (BCID) NOT DETECTED NOT DETECTED Final   Staphylococcus epidermidis NOT DETECTED NOT DETECTED Final   Staphylococcus lugdunensis NOT DETECTED NOT DETECTED Final   Streptococcus species NOT DETECTED NOT DETECTED Final   Streptococcus agalactiae NOT DETECTED NOT DETECTED Final   Streptococcus pneumoniae NOT DETECTED NOT DETECTED Final   Streptococcus pyogenes NOT DETECTED NOT DETECTED Final   A.calcoaceticus-baumannii NOT DETECTED NOT DETECTED Final   Bacteroides fragilis NOT DETECTED NOT DETECTED Final   Enterobacterales DETECTED (A) NOT DETECTED Final    Comment: Enterobacterales represent a large order of gram negative bacteria, not a single organism. CRITICAL RESULT CALLED TO, READ BACK BY AND VERIFIED WITH: PHARMD CHRISTINE SHADE [id number] AT 1516 BY EC    Enterobacter cloacae complex NOT DETECTED NOT DETECTED Final   Escherichia coli DETECTED (A) NOT DETECTED Final    Comment: CRITICAL RESULT CALLED TO, READ BACK BY AND VERIFIED WITH: PHARMD CHRISTINE SHADE [phone] AT 1516 BY EC    Klebsiella aerogenes NOT DETECTED NOT DETECTED Final   Klebsiella oxytoca NOT DETECTED NOT DETECTED Final   Klebsiella pneumoniae NOT DETECTED NOT DETECTED Final   Proteus species NOT DETECTED NOT DETECTED Final   Salmonella species NOT DETECTED NOT DETECTED Final   Serratia marcescens NOT DETECTED NOT DETECTED Final    Haemophilus influenzae NOT DETECTED NOT DETECTED Final   Neisseria meningitidis NOT DETECTED NOT DETECTED Final   Pseudomonas aeruginosa NOT DETECTED NOT DETECTED Final   Stenotrophomonas maltophilia NOT DETECTED NOT DETECTED Final   Candida albicans NOT DETECTED NOT DETECTED Final   Candida auris NOT DETECTED NOT DETECTED Final   Candida glabrata NOT DETECTED NOT DETECTED Final   Candida krusei NOT DETECTED NOT DETECTED Final   Candida parapsilosis NOT DETECTED NOT DETECTED Final   Candida tropicalis NOT DETECTED NOT DETECTED Final   Cryptococcus neoformans/gattii NOT DETECTED NOT DETECTED Final   CTX-M ESBL NOT DETECTED NOT DETECTED Final   Carbapenem resistance IMP NOT DETECTED NOT DETECTED Final   Carbapenem resistance KPC NOT DETECTED NOT DETECTED Final   Carbapenem resistance NDM NOT DETECTED NOT DETECTED Final   Carbapenem resist OXA 48 LIKE NOT DETECTED NOT DETECTED Final   Carbapenem resistance VIM NOT DETECTED NOT DETECTED Final    Comment: Performed at [facility], [address] [place], [place] [address]  Urine Culture     Status: Abnormal   Collection Time: 08/20/23 10:36 PM   Specimen: Urine, Random  Result Value Ref Range Status   Specimen Description   Final    URINE, RANDOM Performed at [facility], [address] [place], [place] [address]    Special Requests   Final    NONE Reflexed from [id number] Performed at [facility], [address], [place], [place] [address]    Culture (A)  Final    <10,000 COLONIES/mL INSIGNIFICANT GROWTH Performed at [facility], [address] [place], [place] [address]    Report Status 08/22/2023 FINAL  Final         Radiology Studies: No results found.         LOS: 2 days   Time spent= 35 mins    Maggie Schooner, MD [facility]  If 7PM-7AM, please contact night-coverage  08/23/2023, 1:14 PM

## 2023-08-24 DIAGNOSIS — A419 Sepsis, unspecified organism: Secondary | ICD-10-CM | POA: Diagnosis not present

## 2023-08-24 DIAGNOSIS — R6520 Severe sepsis without septic shock: Secondary | ICD-10-CM | POA: Diagnosis not present

## 2023-08-24 LAB — GLUCOSE, CAPILLARY
Glucose-Capillary: 235 mg/dL — ABNORMAL HIGH (ref 70–99)
Glucose-Capillary: 302 mg/dL — ABNORMAL HIGH (ref 70–99)
Glucose-Capillary: 298 mg/dL — ABNORMAL HIGH (ref 70–99)
Glucose-Capillary: 258 mg/dL — ABNORMAL HIGH (ref 70–99)

## 2023-08-24 LAB — CBC
HCT: 42.5 % (ref 39.0–52.0)
Hemoglobin: 13.5 g/dL (ref 13.0–17.0)
MCH: 30.5 pg (ref 26.0–34.0)
MCHC: 31.8 g/dL (ref 30.0–36.0)
MCV: 96.2 fL (ref 80.0–100.0)
Platelets: 81 10*3/uL — ABNORMAL LOW (ref 150–400)
RBC: 4.42 MIL/uL (ref 4.22–5.81)
RDW: 14.6 % (ref 11.5–15.5)
WBC: 10.4 10*3/uL (ref 4.0–10.5)
nRBC: 0 % (ref 0.0–0.2)

## 2023-08-24 LAB — BASIC METABOLIC PANEL WITH GFR
Anion gap: 10 (ref 5–15)
BUN: 66 mg/dL — ABNORMAL HIGH (ref 8–23)
CO2: 18 mmol/L — ABNORMAL LOW (ref 22–32)
Calcium: 7.6 mg/dL — ABNORMAL LOW (ref 8.9–10.3)
Chloride: 103 mmol/L (ref 98–111)
Creatinine, Ser: 1.39 mg/dL — ABNORMAL HIGH (ref 0.61–1.24)
GFR, Estimated: 53 mL/min — ABNORMAL LOW (ref >60)
Glucose, Bld: 270 mg/dL — ABNORMAL HIGH (ref 70–99)
Potassium: 3.9 mmol/L (ref 3.5–5.1)
Sodium: 131 mmol/L — ABNORMAL LOW (ref 135–145)

## 2023-08-24 LAB — MAGNESIUM: Magnesium: 2.5 mg/dL — ABNORMAL HIGH (ref 1.7–2.4)

## 2023-08-24 MED ORDER — BISACODYL 5 MG PO TBEC: 10.0000 mg | DELAYED_RELEASE_TABLET | Freq: Two times a day (BID) | ORAL | Status: AC | PRN

## 2023-08-24 MED ORDER — INSULIN GLARGINE-YFGN 100 UNIT/ML ~~LOC~~ SOLN
8.0000 [IU] | Freq: Every day | SUBCUTANEOUS | Status: DC
  Administered 2023-08-24: 8 [IU] via SUBCUTANEOUS
  Filled 2023-08-24 (×2): qty 0.08

## 2023-08-24 MED ORDER — BISACODYL 5 MG PO TBEC
10.0000 mg | DELAYED_RELEASE_TABLET | Freq: Two times a day (BID) | ORAL | Status: AC
  Administered 2023-08-24 – 2023-08-25 (×3): 10 mg via ORAL
  Filled 2023-08-24 (×4): qty 2

## 2023-08-24 MED ORDER — MORPHINE SULFATE (PF) 2 MG/ML IV SOLN
2.0000 mg | INTRAVENOUS | Status: DC | PRN
  Administered 2023-08-24 – 2023-08-25 (×2): 2 mg via INTRAVENOUS
  Filled 2023-08-24 (×3): qty 1

## 2023-08-24 MED ORDER — OXYCODONE HCL 5 MG PO TABS: 5.0000 mg | ORAL_TABLET | Freq: Four times a day (QID) | ORAL | 0 refills | Status: AC | PRN

## 2023-08-24 MED ORDER — PANTOPRAZOLE SODIUM 40 MG PO TBEC: 40.0000 mg | DELAYED_RELEASE_TABLET | Freq: Every day | ORAL | Status: AC

## 2023-08-24 MED ORDER — FOLIC ACID 1 MG PO TABS: 1.0000 mg | ORAL_TABLET | Freq: Every day | ORAL | Status: AC

## 2023-08-24 MED ORDER — OXYCODONE HCL 5 MG PO TABS
5.0000 mg | ORAL_TABLET | ORAL | Status: DC | PRN
  Administered 2023-08-24 – 2023-08-31 (×16): 5 mg via ORAL
  Filled 2023-08-24 (×18): qty 1

## 2023-08-24 MED ORDER — LORAZEPAM 1 MG PO TABS: 1.0000 mg | ORAL_TABLET | Freq: Every day | ORAL | 0 refills | Status: AC

## 2023-08-24 MED ORDER — TRAMADOL HCL 50 MG PO TABS: 50.0000 mg | ORAL_TABLET | Freq: Three times a day (TID) | ORAL | 0 refills | Status: AC | PRN

## 2023-08-24 NOTE — Plan of Care (Signed)

## 2023-08-24 NOTE — Plan of Care (Signed)
  Problem: Coping: Goal: Ability to adjust to condition or change in health will improve Outcome: Progressing   Problem: Coping: Goal: Level of anxiety will decrease Outcome: Progressing   Problem: Pain Managment: Goal: General experience of comfort will improve and/or be controlled Outcome: Progressing

## 2023-08-24 NOTE — Progress Notes (Signed)
   Subjective No acute complaints; has not gotten out of bed. Cr trended better  Physical Exam: BP (!) 175/77 (BP Location: Left Arm)   Pulse 98   Temp (!) 97.4 F (36.3 C) (Oral)   Resp 18   Ht 5\' 9"  (1.753 m)   Wt 120.5 kg   SpO2 99%   BMI 39.23 kg/m    Constitutional:  Alert and oriented, No acute distress. Respiratory: Normal respiratory effort, no increased work of breathing. GI: Abdomen is soft, non-tender, non-distended Drains: Foley with yellow urine; stent string x 2 visible adjacent to catheter.   Laboratory Data: Reviewed in epic Creatinine 1.39 (2.06)(2.26)(2.7) WBC 10.4 (8.2)(7.8)   Assessment & Plan:   78 year old comorbid male status post bilateral ureteroscopy, laser lithotripsy, stent placement with Dr. Jarvis Mesa on 08/18/2023 complicated by suspected sepsis from urinary source.  Clinically improving, currently maximally drained with stents in place and Foley catheter.  Overall improving, continue antibiotics, could transition to PO when deemed OK by primary team Wrote for foley d/c tomorrow Original plan was to remove stents 7 days postop, will plan to leave in place for now. pending his hospital course could remove before d/c  I spent 25 total minutes on the floor with greater than 50% spent directly face-to-face with the patient regarding coordination and care of sepsis after bilateral ureteroscopy, Foley catheter, ureteral stent management.   Mallie Seal, MD

## 2023-08-24 NOTE — Progress Notes (Signed)
 PROGRESS NOTE    [name]  ZOX:[id number] DOB: [birth year] DOA: 08/20/2023 PCP: Patient, [name]    Brief Narrative:  78 year old with history of renal stones status post bilateral stents 07/13/2023, DM2, HTN, HLD, CAD, paroxysmal A-fib, hypothyroidism, chronic pain, ataxia, GERD, hypothyroidism, chronic anemia comes to the ED for abnormal lab, hypotension and abdominal pain.  Upon admission CT renal stone study done, reviewed by urology showing good stent placement but recommended Foley catheter placement.  Currently started on empiric IV Rocephin .   Assessment & Plan:  Principal Problem:   Severe sepsis (HCC) Active Problems:   Sepsis secondary to UTI (HCC)   Acute metabolic encephalopathy   Hyperkalemia   Hyponatremia   Thrombocytopenia (HCC)   Insulin  dependent type 2 diabetes mellitus (HCC)   Hyperlipidemia   Essential hypertension   Hypothyroidism   Nephrolithiasis   Paroxysmal atrial fibrillation (HCC)   History of CAD (coronary artery disease)   History of ataxia   Bilateral ureteral stent present   Hematuria    Severe sepsis Sepsis likely secondary from UTI Bilateral nephrolithiasis s/p lithotripsy and ureteral stent 07/13/2023 Hematuria Concerns of urinary tract infection.  Sepsis have improved.  No obvious evidence of obstruction.  Cultures growing E. coli, continue IV Rocephin .  Foley in place at urology request.  Foley catheter and stent management per urology  Acute kidney injury - Baseline creatinine 0.7, admission creatinine 3.17.  Foley catheter placed, IV fluids.  Slowly improving.  Creatinine today 2.06    Thrombocytopenia Likely in setting of sepsis.  Closely monitor No evidence of bleeding   Hyponatremia, stable Continue IV fluids   Hyperkalemia, stable Improved after Lokelma  Folic acid deficiency - Folate supplement started      Acute metabolic encephalopathy-multifactorial - Suspected polypharmacy versus infection.  Improved after  Narcan  in ER     Metabolic acidosis  -Low bicarb 17.  Normal anion gap 14.  Metabolic acidosis in the setting of AKI and lactic acidosis.  Continue to treat for AKI and lactic acidosis.   Paroxysmal atrial fibrillation -Holding Coreg  in the setting of hypotension.  At home patient is not on any anticoagulant.   History of CAD -Holding aspirin  and Plavix  in the setting of hematuria and new development of thrombocytopenia.  Will resume once platelet count will be improved   Essential hypertension -Holding home blood pressure regimen in the setting of sepsis.  IV as needed   Hypothyroidism -Continue levothyroxine    Insulin -dependent DM type II - A1c 9.4.  Sliding scale and Accu-Chek.  Semglee  5 units daily added     Generalized anxiety disorder - Continue Zoloft  and Wellbutrin     DVT prophylaxis: SCD's    Code Status: Full Code Family Communication: Sister at bedside Status is: Inpatient Pending final urology evaluation    Subjective: Complaining of pain in his flanks  Examination:  General exam: Appears calm and comfortable  Respiratory system: Clear to auscultation. Respiratory effort normal. Cardiovascular system: S1 & S2 heard, RRR. No JVD, murmurs, rubs, gallops or clicks. No pedal edema. Gastrointestinal system: Abdomen is nondistended, soft and nontender. No organomegaly or masses felt. Normal bowel sounds heard. Central nervous system: Alert and oriented. No focal neurological deficits. Extremities: Symmetric 5 x 5 power. Skin: No rashes, lesions or ulcers Psychiatry: Judgement and insight appear normal. Mood & affect appropriate. Foley in place.                Diet Orders (From admission, onward)     Start  Ordered   08/21/23 0035  Diet heart healthy/carb modified Room service appropriate? Yes; Fluid consistency: Thin  Diet effective now       Question Answer Comment  Diet-HS Snack? Nothing   Room service appropriate? Yes   Fluid  consistency: Thin      08/21/23 0034            Objective: Vitals:   08/23/23 1253 08/23/23 1928 08/24/23 0417 08/24/23 1142  BP: (!) 147/69 (!) 161/80 104/78 (!) 175/77  Pulse: 95 90 71 98  Resp:  20 20 18   Temp:  (!) 97.5 F (36.4 C) 98.2 F (36.8 C) (!) 97.4 F (36.3 C)  TempSrc:  Oral  Oral  SpO2:  98% 92% 99%  Weight:      Height:        Intake/Output Summary (Last 24 hours) at 08/24/2023 1348 Last data filed at 08/24/2023 1100 Gross per 24 hour  Intake --  Output 2650 ml  Net -2650 ml   Filed Weights   08/21/23 0406 08/21/23 0900  Weight: 111.1 kg 120.5 kg    Scheduled Meds:  bisacodyl   10 mg Oral BID   buPROPion   150 mg Oral Daily   Chlorhexidine  Gluconate Cloth  6 each Topical Daily   docusate sodium   100 mg Oral BID   fentaNYL  (SUBLIMAZE ) injection  25 mcg Intravenous Once   folic acid  1 mg Oral Daily   insulin  aspart  0-5 Units Subcutaneous QHS   insulin  aspart  0-6 Units Subcutaneous TID WC   insulin  glargine-yfgn  8 Units Subcutaneous Daily   levothyroxine   50 mcg Oral QAC breakfast   pantoprazole   40 mg Oral QAC breakfast   polyethylene glycol  17 g Oral Daily   sertraline   150 mg Oral Daily   sodium chloride  flush  3 mL Intravenous Q12H   sodium chloride  flush  3 mL Intravenous Q12H   tamsulosin  0.4 mg Oral QPM   Continuous Infusions:  sodium chloride  100 mL/hr at 08/24/23 0555   cefTRIAXone  (ROCEPHIN )  IV 2 g (08/23/23 1705)    Nutritional status     Body mass index is 39.23 kg/m.  Data Reviewed:   CBC: Recent Labs  Lab 08/20/23 2145 08/21/23 0429 08/22/23 0257 08/23/23 0503 08/24/23 0425  WBC 11.1* 10.9* 7.8 8.2 10.4  NEUTROABS 10.0*  --   --   --   --   HGB 11.9* 11.5* 11.9* 12.1* 13.5  HCT 36.2* 34.7* 38.3* 38.0* 42.5  MCV 95.3 95.6 98.7 95.5 96.2  PLT 89* 73*  75* 63* 66* 81*   Basic Metabolic Panel: Recent Labs  Lab 08/21/23 0051 08/21/23 0429 08/22/23 0257 08/23/23 0503 08/24/23 0425  NA 128* 130* 132*  130* 131*  K 5.0 4.8 4.6 4.4 3.9  CL 97* 98 103 103 103  CO2 17* 21* 17* 17* 18*  GLUCOSE 76 93 105* 229* 270*  BUN 52* 53* 59* 72* 66*  CREATININE 2.87* 2.70* 2.26* 2.06* 1.39*  CALCIUM  8.0* 8.0* 7.7* 7.6* 7.6*  MG  --   --  1.4* 2.3 2.5*  PHOS  --   --  3.3  --   --    GFR: Estimated Creatinine Clearance: 58.8 mL/min (A) (by C-G formula based on SCr of 1.39 mg/dL (H)). Liver Function Tests: Recent Labs  Lab 08/20/23 2145 08/21/23 0429  AST 28 23  ALT 21 18  ALKPHOS 63 67  BILITOT 0.7 0.9  PROT 5.9* 5.5*  ALBUMIN 2.8* 2.5*  No results for input(s): "LIPASE", "AMYLASE" in the last 168 hours. Recent Labs  Lab 08/21/23 1019  AMMONIA 16   Coagulation Profile: Recent Labs  Lab 08/20/23 2145 08/21/23 0429 08/21/23 0747  INR 1.2 1.2 1.2   Cardiac Enzymes: No results for input(s): "CKTOTAL", "CKMB", "CKMBINDEX", "TROPONINI" in the last 168 hours. BNP (last 3 results) No results for input(s): "PROBNP" in the last 8760 hours. HbA1C: No results for input(s): "HGBA1C" in the last 72 hours. CBG: Recent Labs  Lab 08/23/23 0728 08/23/23 1614 08/23/23 2057 08/24/23 0750 08/24/23 1139  GLUCAP 196* 217* 241* 235* 302*   Lipid Profile: No results for input(s): "CHOL", "HDL", "LDLCALC", "TRIG", "CHOLHDL", "LDLDIRECT" in the last 72 hours. Thyroid  Function Tests: No results for input(s): "TSH", "T4TOTAL", "FREET4", "T3FREE", "THYROIDAB" in the last 72 hours. Anemia Panel: Recent Labs    08/22/23 0257  FOLATE 4.2*   Sepsis Labs: Recent Labs  Lab 08/21/23 0033 08/21/23 0329 08/21/23 0429 08/21/23 0747  LATICACIDVEN 4.8* 3.0* 3.2* 2.2*    Recent Results (from the past 240 hours)  Blood Culture (routine x 2)     Status: Abnormal   Collection Time: 08/20/23  9:53 PM   Specimen: BLOOD LEFT FOREARM  Result Value Ref Range Status   Specimen Description   Final    BLOOD LEFT FOREARM Performed at [facility], [address], [place], [place] [address]     Special Requests   Final    BOTTLES DRAWN AEROBIC AND ANAEROBIC Blood Culture results may not be optimal due to an inadequate volume of blood received in culture bottles Performed at [facility], [address] [place], [place] [address]    Culture  Setup Time   Final    GRAM NEGATIVE RODS IN BOTH AEROBIC AND ANAEROBIC BOTTLES CRITICAL VALUE NOTED.  VALUE IS CONSISTENT WITH PREVIOUSLY REPORTED AND CALLED VALUE.    Culture (A)  Final    ESCHERICHIA COLI SUSCEPTIBILITIES PERFORMED ON PREVIOUS CULTURE WITHIN THE LAST 5 DAYS. Performed at [facility], [address], [place], [place] [address]    Report Status 08/23/2023 FINAL  Final  Blood Culture (routine x 2)     Status: Abnormal   Collection Time: 08/20/23  9:53 PM   Specimen: BLOOD RIGHT HAND  Result Value Ref Range Status   Specimen Description   Final    BLOOD RIGHT HAND Performed at [facility], [address] [place], [place] [address]    Special Requests   Final    BOTTLES DRAWN AEROBIC AND ANAEROBIC Blood Culture adequate volume Performed at [facility], [address], [place], [place] [address]    Culture  Setup Time   Final    GRAM NEGATIVE RODS IN BOTH AEROBIC AND ANAEROBIC BOTTLES CRITICAL RESULT CALLED TO, READ BACK BY AND VERIFIED WITH: PHARMD CHRISTINE SHADE [phone] [id number] BY EC Performed at [facility], [address] [place], [place] [address]    Culture ESCHERICHIA COLI (A)  Final   Report Status 08/23/2023 FINAL  Final   Organism ID, Bacteria ESCHERICHIA COLI  Final   Organism ID, Bacteria ESCHERICHIA COLI  Final      Susceptibility   Escherichia coli - KIRBY BAUER*    CEFAZOLIN  RESISTANT Resistant    Escherichia coli - MIC*    AMPICILLIN >=32 RESISTANT Resistant     CEFEPIME  <=0.12 SENSITIVE Sensitive     CEFTAZIDIME <=1 SENSITIVE Sensitive     CEFTRIAXONE  <=  0.25 SENSITIVE Sensitive     CIPROFLOXACIN  >=4 RESISTANT Resistant     GENTAMICIN  <=1 SENSITIVE Sensitive     IMIPENEM <=0.25 SENSITIVE Sensitive     TRIMETH/SULFA <=20 SENSITIVE Sensitive     AMPICILLIN/SULBACTAM >=32 RESISTANT Resistant     PIP/TAZO 8 SENSITIVE Sensitive ug/mL    * ESCHERICHIA COLI    ESCHERICHIA COLI  Blood Culture ID Panel (Reflexed)     Status: Abnormal   Collection Time: 08/20/23  9:53 PM  Result Value Ref Range Status   Enterococcus faecalis NOT DETECTED NOT DETECTED Final   Enterococcus Faecium NOT DETECTED NOT DETECTED Final   Listeria monocytogenes NOT DETECTED NOT DETECTED Final   Staphylococcus species NOT DETECTED NOT DETECTED Final   Staphylococcus aureus (BCID) NOT DETECTED NOT DETECTED Final   Staphylococcus epidermidis NOT DETECTED NOT DETECTED Final   Staphylococcus lugdunensis NOT DETECTED NOT DETECTED Final   Streptococcus species NOT DETECTED NOT DETECTED Final   Streptococcus agalactiae NOT DETECTED NOT DETECTED Final   Streptococcus pneumoniae NOT DETECTED NOT DETECTED Final   Streptococcus pyogenes NOT DETECTED NOT DETECTED Final   A.calcoaceticus-baumannii NOT DETECTED NOT DETECTED Final   Bacteroides fragilis NOT DETECTED NOT DETECTED Final   Enterobacterales DETECTED (A) NOT DETECTED Final    Comment: Enterobacterales represent a large order of gram negative bacteria, not a single organism. CRITICAL RESULT CALLED TO, READ BACK BY AND VERIFIED WITH: PHARMD CHRISTINE SHADE [id number] AT 1516 BY EC    Enterobacter cloacae complex NOT DETECTED NOT DETECTED Final   Escherichia coli DETECTED (A) NOT DETECTED Final    Comment: CRITICAL RESULT CALLED TO, READ BACK BY AND VERIFIED WITH: PHARMD CHRISTINE SHADE [phone] AT 1516 BY EC    Klebsiella aerogenes NOT DETECTED NOT DETECTED Final   Klebsiella oxytoca NOT DETECTED NOT DETECTED Final   Klebsiella pneumoniae NOT DETECTED NOT DETECTED Final   Proteus species NOT DETECTED NOT DETECTED Final   Salmonella species NOT DETECTED NOT DETECTED Final   Serratia marcescens NOT  DETECTED NOT DETECTED Final   Haemophilus influenzae NOT DETECTED NOT DETECTED Final   Neisseria meningitidis NOT DETECTED NOT DETECTED Final   Pseudomonas aeruginosa NOT DETECTED NOT DETECTED Final   Stenotrophomonas maltophilia NOT DETECTED NOT DETECTED Final   Candida albicans NOT DETECTED NOT DETECTED Final   Candida auris NOT DETECTED NOT DETECTED Final   Candida glabrata NOT DETECTED NOT DETECTED Final   Candida krusei NOT DETECTED NOT DETECTED Final   Candida parapsilosis NOT DETECTED NOT DETECTED Final   Candida tropicalis NOT DETECTED NOT DETECTED Final   Cryptococcus neoformans/gattii NOT DETECTED NOT DETECTED Final   CTX-M ESBL NOT DETECTED NOT DETECTED Final   Carbapenem resistance IMP NOT DETECTED NOT DETECTED Final   Carbapenem resistance KPC NOT DETECTED NOT DETECTED Final   Carbapenem resistance NDM NOT DETECTED NOT DETECTED Final   Carbapenem resist OXA 48 LIKE NOT DETECTED NOT DETECTED Final   Carbapenem resistance VIM NOT DETECTED NOT DETECTED Final    Comment: Performed at [facility], [address] [place], [place] [address]  Urine Culture     Status: Abnormal   Collection Time: 08/20/23 10:36 PM   Specimen: Urine, Random  Result Value Ref Range Status   Specimen Description   Final    URINE, RANDOM Performed at [facility], [address], [place], [place] [address]    Special Requests   Final    NONE Reflexed from [id number] Performed at [facility], [address], [place], [place] [address]    Culture (A)  Final    <10,000 COLONIES/mL INSIGNIFICANT GROWTH Performed at [facility], [address] [place], [place] [address]    Report Status 08/22/2023 FINAL  Final         Radiology Studies: No results found.         LOS: 3 days   Time spent= 35 mins    Maggie Schooner, MD [facility]  If 7PM-7AM, please contact night-coverage  08/24/2023, 1:48 PM

## 2023-08-25 DIAGNOSIS — R6520 Severe sepsis without septic shock: Secondary | ICD-10-CM | POA: Diagnosis not present

## 2023-08-25 DIAGNOSIS — A419 Sepsis, unspecified organism: Secondary | ICD-10-CM | POA: Diagnosis not present

## 2023-08-25 LAB — CBC
HCT: 38.3 % — ABNORMAL LOW (ref 39.0–52.0)
Hemoglobin: 12.1 g/dL — ABNORMAL LOW (ref 13.0–17.0)
MCH: 30.3 pg (ref 26.0–34.0)
MCHC: 31.6 g/dL (ref 30.0–36.0)
MCV: 96 fL (ref 80.0–100.0)
Platelets: 113 10*3/uL — ABNORMAL LOW (ref 150–400)
RBC: 3.99 MIL/uL — ABNORMAL LOW (ref 4.22–5.81)
RDW: 14.6 % (ref 11.5–15.5)
WBC: 13.2 10*3/uL — ABNORMAL HIGH (ref 4.0–10.5)
nRBC: 0 % (ref 0.0–0.2)

## 2023-08-25 LAB — CALCIUM, IONIZED: Calcium, Ionized, Serum: 4.5 mg/dL (ref 4.5–5.6)

## 2023-08-25 LAB — BASIC METABOLIC PANEL WITH GFR
Anion gap: 6 (ref 5–15)
BUN: 55 mg/dL — ABNORMAL HIGH (ref 8–23)
CO2: 22 mmol/L (ref 22–32)
Calcium: 7.9 mg/dL — ABNORMAL LOW (ref 8.9–10.3)
Chloride: 109 mmol/L (ref 98–111)
Creatinine, Ser: 0.91 mg/dL (ref 0.61–1.24)
GFR, Estimated: >60 mL/min (ref >60)
Glucose, Bld: 267 mg/dL — ABNORMAL HIGH (ref 70–99)
Potassium: 3.9 mmol/L (ref 3.5–5.1)
Sodium: 137 mmol/L (ref 135–145)

## 2023-08-25 LAB — GLUCOSE, CAPILLARY
Glucose-Capillary: 224 mg/dL — ABNORMAL HIGH (ref 70–99)
Glucose-Capillary: 222 mg/dL — ABNORMAL HIGH (ref 70–99)
Glucose-Capillary: 208 mg/dL — ABNORMAL HIGH (ref 70–99)
Glucose-Capillary: 226 mg/dL — ABNORMAL HIGH (ref 70–99)

## 2023-08-25 LAB — MAGNESIUM: Magnesium: 2.2 mg/dL (ref 1.7–2.4)

## 2023-08-25 MED ORDER — DIPHENHYDRAMINE HCL 50 MG/ML IJ SOLN: 25.0000 mg | Freq: Once | INTRAMUSCULAR | Status: DC | PRN

## 2023-08-25 MED ORDER — AMLODIPINE BESYLATE 5 MG PO TABS
5.0000 mg | ORAL_TABLET | Freq: Every day | ORAL | Status: DC
  Administered 2023-08-25 – 2023-09-03 (×10): 5 mg via ORAL
  Filled 2023-08-25 (×10): qty 1

## 2023-08-25 MED ORDER — CARVEDILOL 6.25 MG PO TABS
6.2500 mg | ORAL_TABLET | Freq: Two times a day (BID) | ORAL | Status: DC
  Administered 2023-08-25 – 2023-09-03 (×18): 6.25 mg via ORAL
  Filled 2023-08-25 (×4): qty 1
  Filled 2023-08-25 (×7): qty 2
  Filled 2023-08-25: qty 1
  Filled 2023-08-25 (×5): qty 2
  Filled 2023-08-25: qty 1

## 2023-08-25 MED ORDER — SULFAMETHOXAZOLE-TRIMETHOPRIM 800-160 MG PO TABS
1.0000 | ORAL_TABLET | Freq: Once | ORAL | Status: AC
  Administered 2023-08-25: 1 via ORAL
  Filled 2023-08-25: qty 1

## 2023-08-25 MED ORDER — INSULIN ASPART 100 UNIT/ML IJ SOLN
3.0000 [IU] | Freq: Three times a day (TID) | INTRAMUSCULAR | Status: DC
  Administered 2023-08-25 – 2023-09-03 (×25): 3 [IU] via SUBCUTANEOUS

## 2023-08-25 MED ORDER — EPINEPHRINE 0.3 MG/0.3ML IJ SOAJ: 0.3000 mg | Freq: Once | INTRAMUSCULAR | Status: DC | PRN

## 2023-08-25 MED ORDER — INSULIN GLARGINE-YFGN 100 UNIT/ML ~~LOC~~ SOLN
10.0000 [IU] | Freq: Every day | SUBCUTANEOUS | Status: DC
  Administered 2023-08-25 – 2023-08-26 (×2): 10 [IU] via SUBCUTANEOUS
  Filled 2023-08-25 (×2): qty 0.1

## 2023-08-25 MED ORDER — SULFAMETHOXAZOLE-TRIMETHOPRIM 800-160 MG PO TABS
2.0000 | ORAL_TABLET | Freq: Two times a day (BID) | ORAL | Status: DC
Start: 2023-08-25 — End: 2023-08-26
  Administered 2023-08-25: 2 via ORAL
  Filled 2023-08-25 (×2): qty 2

## 2023-08-25 NOTE — TOC Initial Note (Addendum)
 Transition of Care [facility]) - Initial/Assessment Note    Patient Details  Name: [name] MRN: [id number] Date of Birth: [birth year]  [facility] of [facility]) CM/SW Contact:    Kathryn Parish, RN Phone Number: 08/25/2023, 8:46 AM  Clinical Narrative:                 Pt lives at [facility]. Spoke with [place] sister. Spoke with [employer] rep and pt can return. 9:12 AM FL2 completed   Expected Discharge Plan: Long Term Nursing Home Barriers to Discharge: Continued Medical Work up   Patient Goals and CMS Choice Patient states their goals for this hospitalization and ongoing recovery are:: Return to [employer]          Expected Discharge Plan and Services   Discharge Planning Services: CM Consult   Living arrangements for the past 2 months: Skilled Nursing Facility ([facility])                                      Prior Living Arrangements/Services Living arrangements for the past 2 months: Skilled Nursing Facility ([facility]) Lives with:: Facility Resident Patient language and need for interpreter reviewed:: Yes Do you feel safe going back to the place where you live?: Yes      Need for Family Participation in Patient Care: Yes (Comment) Care giver support system in place?: Yes (comment)   Criminal Activity/Legal Involvement Pertinent to Current Situation/Hospitalization: No - Comment as needed  Activities of Daily Living   ADL Screening (condition at time of admission) Independently performs ADLs?: No Does the patient have a NEW difficulty with bathing/dressing/toileting/self-feeding that is expected to last >3 days?: Yes (Initiates electronic notice to provider for possible OT consult) Does the patient have a NEW difficulty with getting in/out of bed, walking, or climbing stairs that is expected to last >3 days?: Yes (Initiates electronic notice to provider for possible PT consult) Does the patient have a NEW difficulty  with communication that is expected to last >3 days?: No Is the patient deaf or have difficulty hearing?: Yes Does the patient have difficulty seeing, even when wearing glasses/contacts?: No Does the patient have difficulty concentrating, remembering, or making decisions?: No  Permission Sought/Granted Permission sought to share information with : Case Manager, Magazine features editor Permission granted to share information with : Yes, Verbal Permission Granted  Share Information with NAME: [facility]           Emotional Assessment Appearance:: Appears stated age     Orientation: : Oriented to Self, Oriented to Place Alcohol / Substance Use: Not Applicable Psych Involvement: No (comment)  Admission diagnosis:  Lactic acidosis [E87.20] Polypharmacy [Z79.899] AKI (acute kidney injury) (HCC) [N17.9] Severe sepsis (HCC) [A41.9, R65.20] Urinary tract infection without hematuria, site unspecified [N39.0] Sepsis, due to unspecified organism, unspecified whether acute organ dysfunction present [facility]) [A41.9] Patient Active Problem List   Diagnosis Date Noted   Acute metabolic encephalopathy 08/21/2023   Hyperkalemia 08/21/2023   Hyponatremia 08/21/2023   Paroxysmal atrial fibrillation (HCC) 08/21/2023   History of CAD (coronary artery disease) 08/21/2023   History of ataxia 08/21/2023   Thrombocytopenia (HCC) 08/21/2023   Bilateral ureteral stent present 08/21/2023   Hematuria 08/21/2023   Severe sepsis (HCC) 08/21/2023   Bilateral ureteral calculi 07/13/2023   UTI (urinary tract infection) 07/13/2023   Septic shock (HCC) 08/01/2022   Nephrolithiasis 08/01/2022  AKI (acute kidney injury) (HCC) 08/01/2022   Sepsis secondary to UTI (HCC) 08/01/2022   Cervical radiculopathy at C5 06/10/2022   Nontraumatic complete tear of right rotator cuff 06/09/2022   Cervical disc disorder at C5-C6 level with radiculopathy 06/09/2022   Right sided weakness 06/04/2022   Weakness of right  upper extremity 06/04/2022   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   Chronic, continuous use of opioids 02/12/2021   Other spondylosis, lumbar region 02/12/2021   NSVT (nonsustained ventricular tachycardia) (HCC) 10/12/2020   PAT (paroxysmal atrial tachycardia) (HCC) 10/12/2020   History of fusion of cervical spine 09/04/2020   Lumbar post-laminectomy syndrome 09/04/2020   Pain medication agreement 09/04/2020   Anxiety    Arthritis    Chronic back pain    Coronary artery disease    Depression    Diverticulitis    Dizziness    DJD (degenerative joint disease)    GERD (gastroesophageal reflux disease)    History of colon polyps    Essential hypertension    Hypothyroidism    IBS (irritable bowel syndrome)    Tuberculosis    Insulin  dependent type 2 diabetes mellitus (HCC)    Weakness    Abnormal stress test    Fatigue 09/06/2019   Preoperative cardiovascular examination 09/06/2019   Swelling of lower limb 06/20/2014   Claudication (HCC) 06/20/2014   HNP (herniated nucleus pulposus), lumbar 06/14/2014   Displacement of lumbar intervertebral disc without myelopathy 05/30/2014   Atypical chest pain 12/28/2013   Unstable angina (HCC) 12/27/2013   CAD in native artery 03/14/2013    Class: Chronic   Hyperlipidemia 03/14/2013   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HCC) 03/14/2013   Chest pain 07/30/2011   Obesity, unspecified 07/30/2011   Essential hypertension, benign 07/30/2011   Diabetes mellitus type 2, controlled, with complications (HCC) 07/30/2011   PCP:  Patient, [name] Pharmacy:   [id number] [place], [place] - [address] AT [facility] [address] [place] [address] Phone: [phone] Fax: [phone]     Social Drivers of Health (SDOH) Social History: SDOH Screenings   Food Insecurity: No Food Insecurity (08/21/2023)  Housing: Low Risk  (08/21/2023)  Transportation  Needs: No Transportation Needs (08/21/2023)  Utilities: Not At Risk (08/21/2023)  Social Connections: Patient Declined (08/25/2023)  Tobacco Use: High Risk (08/21/2023)   SDOH Interventions:     Readmission Risk Interventions     No data to display

## 2023-08-25 NOTE — Progress Notes (Signed)
     Subjective: No acute events overnight.  Patient resting comfortably in bed this morning.  Foley catheter removed.  Patient has been voiding since.  Nursing to BladderScan for PVR if any concern for urinary retention, though these will yield questionable results considering his body habitus.  Objective: Vital signs in last 24 hours: Temp:  [97.4 F (36.3 C)-98.5 F (36.9 C)] 98.5 F (36.9 C) (05/13 0427) Pulse Rate:  [83-98] 83 (05/13 0427) Resp:  [18-19] 19 (05/13 0427) BP: (144-175)/(76-79) 167/79 (05/13 0921) SpO2:  [97 %-99 %] 99 % (05/13 0427)  Assessment/Plan:  #sepsis #bilateral ureteral stents #AKI CT A/P shows stents to be in good position.  Tethered stents were scheduled to stay in place for 7 days.  Foley removed today and patient voiding appropriately.  Patient to undergo Bactrim challenge while in hospital and then hopefully discharge to skilled nursing within the next couple of days.  Urology will remove tethered stents 24 hours prior to his planned discharge time.  Intake/Output from previous day: 05/12 0701 - 05/13 0700 In: -  Out: 2050 [Urine:1900; Blood:150]  Intake/Output this shift: No intake/output data recorded.  Physical Exam:  General: Alert and oriented CV: No cyanosis Lungs: equal chest rise Gu: Bilateral tethered stents.  Foley catheter removed.  Lab Results: Recent Labs    08/23/23 0503 08/24/23 0425 08/25/23 0422  HGB 12.1* 13.5 12.1*  HCT 38.0* 42.5 38.3*   BMET Recent Labs    08/24/23 0425 08/25/23 0422  NA 131* 137  K 3.9 3.9  CL 103 109  CO2 18* 22  GLUCOSE 270* 267*  BUN 66* 55*  CREATININE 1.39* 0.91  CALCIUM  7.6* 7.9*  HGB 13.5 12.1*  WBC 10.4 13.2*     Studies/Results: No results found.    LOS: 4 days   Alla Ar, NP [facility] Pager: [phone]  08/25/2023, 11:38 AM

## 2023-08-25 NOTE — Progress Notes (Signed)
 Occupational Therapy Treatment Patient Details Name: [name] MRN: [id number] DOB: 1946-04-04 Today's Date: 08/25/2023   History of present illness Mr. Navas is a 78 y/o male admitted from [facility] 08/20/23 with flank pain, severe sepsis 2* UTI and acute metabolic encephalopathy. Pt with recent admit to [facility] with kidney stones and s/p bil ureteral stent placement 07/13/23.  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   OT comments  Patient seen for skilled OT session. Initiated EOB for transfers OOB and patient needed urinal use emergently and was already incontinent thus assisted back to bed. Worked with NT to facilitate self care and mobility independence as stated below. Continues to require increased effort and support due to body habitus and generalized weakness but making gains toward goals and open to therapy presented. OT will continue to follow acutely and recommends continued OT services at LTC facility.       If plan is discharge home, recommend the following:  A lot of help with walking and/or transfers;A lot of help with bathing/dressing/bathroom;Assistance with cooking/housework;Assist for transportation;Help with stairs or ramp for entrance   Equipment Recommendations  BSC/3in1       Precautions / Restrictions Precautions Precautions: Fall Restrictions Weight Bearing Restrictions Per Provider Order: No       Mobility Bed Mobility Overal bed mobility: Needs Assistance Bed Mobility: Rolling, Sidelying to Sit Rolling: Mod assist Sidelying to sit: Mod assist, Max assist, +2 for physical assistance, +2 for safety/equipment       General bed mobility comments: multimodal cues and effort with rails and HOB full elevation    Transfers Overall transfer level:  (voiding need emergently)                       Balance Overall balance  assessment: Mild deficits observed, not formally tested Sitting-balance support: Feet supported, No upper extremity supported Sitting balance-Leahy Scale: Fair                                     ADL either performed or assessed with clinical judgement   ADL Overall ADL's : Needs assistance/impaired     Grooming: Wash/dry hands;Wash/dry face;Sitting;Minimal assistance Grooming Details (indicate cue type and reason): EOB breifly prior to need for linen change and urinal usage with inability to perform placement in sitting Upper Body Bathing: Moderate assistance;Bed level   Lower Body Bathing: Maximal assistance;+2 for physical assistance;+2 for safety/equipment;Sitting/lateral leans;Bed level   Upper Body Dressing : Moderate assistance;Sitting   Lower Body Dressing: Sit to/from stand;Maximal assistance;Sitting/lateral leans;Bed level       Toileting- Clothing Manipulation and Hygiene: Moderate assistance Toileting - Clothing Manipulation Details (indicate cue type and reason): use of urinal x 2 due to increaseed body habitus     Functional mobility during ADLs: Moderate assistance;Maximal assistance;+2 for physical assistance;+2 for safety/equipment (bed level) General ADL Comments: increased difficulty managing voiding and Lb bathing due to increased body habitus    Extremity/Trunk Assessment Upper Extremity Assessment Upper Extremity Assessment: Generalized weakness;RUE deficits/detail RUE Deficits / Details: decreased active shoulder flexion - pt not able to comb hair.   Lower Extremity Assessment Lower Extremity Assessment: Generalized weakness                 Communication Communication Communication: Impaired Factors Affecting Communication: Hearing  impaired (B hearing aides)   Cognition Arousal: Alert Behavior During Therapy: Flat affect Cognition: No apparent impairments                               Following commands:  Impaired Following commands impaired: Follows one step commands with increased time, Follows one step commands inconsistently      Cueing   Cueing Techniques: Verbal cues, Gestural cues, Tactile cues        General Comments abrasions noted on scrotum and nursing notified to address    Pertinent Vitals/ Pain       Pain Assessment Pain Assessment: Faces Faces Pain Scale: Hurts a little bit Pain Location: abdomen Pain Descriptors / Indicators: Discomfort Pain Intervention(s): Monitored during session, Repositioned   Frequency  Min 1X/week        Progress Toward Goals  OT Goals(current goals can now be found in the care plan section)  Progress towards OT goals: Progressing toward goals  Acute Rehab OT Goals Patient Stated Goal: to return to care facility OT Goal Formulation: With patient Time For Goal Achievement: 09/05/23 Potential to Achieve Goals: Good ADL Goals Pt Will Perform Lower Body Bathing: with min assist;sit to/from stand Pt Will Transfer to Toilet: with min assist;ambulating;bedside commode Pt Will Perform Toileting - Clothing Manipulation and hygiene: with min assist;sit to/from stand  Plan      AM-PAC OT "6 Clicks" Daily Activity     Outcome Measure   Help from another person eating meals?: A Little Help from another person taking care of personal grooming?: A Little Help from another person toileting, which includes using toliet, bedpan, or urinal?: A Lot Help from another person bathing (including washing, rinsing, drying)?: A Lot Help from another person to put on and taking off regular upper body clothing?: A Lot Help from another person to put on and taking off regular lower body clothing?: A Lot 6 Click Score: 14    End of Session Equipment Utilized During Treatment: Gait belt  OT Visit Diagnosis: Unsteadiness on feet (R26.81);Other abnormalities of gait and mobility (R26.89);Muscle weakness (generalized) (M62.81)   Activity Tolerance Patient  limited by fatigue;Other (comment) (urgency and incontnence)   Patient Left in bed;with call bell/phone within reach;with bed alarm set   Nurse Communication Mobility status;Other (comment) (voiding and skin issues)        Time: 1130-1205 OT Time Calculation (min): 35 min  Charges: OT General Charges $OT Visit: 1 Visit OT Treatments $Self Care/Home Management : 23-37 mins  Valerian Jewel OT/L Acute Rehabilitation Department  [phone]  08/25/2023, 1:09 PM

## 2023-08-25 NOTE — NC FL2 (Signed)
 [place]  MEDICAID FL2 LEVEL OF CARE FORM     IDENTIFICATION  Patient Name: [name] Birthdate: 29-Mar-1946 Sex: male Admission Date (Current Location): 08/20/2023  [facility] and [place] Number:  Producer, television/film/video and Address:  [facility],  [address], [place] [address]      Provider Number: [id number]  Attending Physician Name and Address:  Maggie Schooner, MD  Relative Name and Phone Number:       Current Level of Care: SNF Recommended Level of Care: Skilled Nursing Facility Prior Approval Number:    Date Approved/Denied:   PASRR Number: [id number]  Discharge Plan: SNF    Current Diagnoses: Patient Active Problem List   Diagnosis Date Noted   Acute metabolic encephalopathy 08/21/2023   Hyperkalemia 08/21/2023   Hyponatremia 08/21/2023   Paroxysmal atrial fibrillation (HCC) 08/21/2023   History of CAD (coronary artery disease) 08/21/2023   History of ataxia 08/21/2023   Thrombocytopenia (HCC) 08/21/2023   Bilateral ureteral stent present 08/21/2023   Hematuria 08/21/2023   Severe sepsis (HCC) 08/21/2023   Bilateral ureteral calculi 07/13/2023   UTI (urinary tract infection) 07/13/2023   Septic shock (HCC) 08/01/2022   Nephrolithiasis 08/01/2022   AKI (acute kidney injury) (HCC) 08/01/2022   Sepsis secondary to UTI (HCC) 08/01/2022   Cervical radiculopathy at C5 06/10/2022   Nontraumatic complete tear of right rotator cuff 06/09/2022   Cervical disc disorder at C5-C6 level with radiculopathy 06/09/2022   Right sided weakness 06/04/2022   Weakness of right upper extremity 06/04/2022   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   Chronic, continuous use of opioids 02/12/2021   Other spondylosis, lumbar region 02/12/2021   NSVT (nonsustained ventricular tachycardia) (HCC) 10/12/2020   PAT (paroxysmal atrial tachycardia) (HCC) 10/12/2020   History of fusion of cervical spine 09/04/2020   Lumbar post-laminectomy syndrome  09/04/2020   Pain medication agreement 09/04/2020   Anxiety    Arthritis    Chronic back pain    Coronary artery disease    Depression    Diverticulitis    Dizziness    DJD (degenerative joint disease)    GERD (gastroesophageal reflux disease)    History of colon polyps    Essential hypertension    Hypothyroidism    IBS (irritable bowel syndrome)    Tuberculosis    Insulin  dependent type 2 diabetes mellitus (HCC)    Weakness    Abnormal stress test    Fatigue 09/06/2019   Preoperative cardiovascular examination 09/06/2019   Swelling of lower limb 06/20/2014   Claudication (HCC) 06/20/2014   HNP (herniated nucleus pulposus), lumbar 06/14/2014   Displacement of lumbar intervertebral disc without myelopathy 05/30/2014   Atypical chest pain 12/28/2013   Unstable angina (HCC) 12/27/2013   CAD in native artery 03/14/2013   Hyperlipidemia 03/14/2013   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HCC) 03/14/2013   Chest pain 07/30/2011   Obesity, unspecified 07/30/2011   Essential hypertension, benign 07/30/2011   Diabetes mellitus type 2, controlled, with complications (HCC) 07/30/2011    Orientation RESPIRATION BLADDER Height & Weight     Self, Time  Normal Indwelling catheter Weight: 120.5 kg Height:  5\' 9"  (175.3 cm)  BEHAVIORAL SYMPTOMS/MOOD NEUROLOGICAL BOWEL NUTRITION STATUS      Continent Diet  AMBULATORY STATUS COMMUNICATION OF NEEDS Skin   Limited Assist Verbally Normal                       Personal  Care Assistance Level of Assistance  Bathing, Feeding, Dressing Bathing Assistance: Limited assistance Feeding assistance: Limited assistance Dressing Assistance: Limited assistance     Functional Limitations Info             SPECIAL CARE FACTORS FREQUENCY  PT (By licensed PT), OT (By licensed OT)     PT Frequency: 5x weekly OT Frequency: 5x weekly            Contractures Contractures Info: Not present     Additional Factors Info  Allergies, Psychotropic, Insulin  Sliding Scale   Allergies Info: Trimethoprim, Misc. Sulfonamide Containing Compounds, Sulfa Antibiotics Psychotropic Info: buPROPion  (WELLBUTRIN  XL), zoloft  Insulin  Sliding Scale Info: insulin  aspart (novoLOG ) injection 0-5 Units 0-5 Units, Subcutaneous, Daily at bedtime, Correction coverage: HS scale  CBG < 70: Implement Hypoglycemia   CBG 70 - 120: 0 units  CBG 121 - 150: 0 units  CBG 151 - 200: 0 units  CBG 201 - 250: 2 units  CBG 251 - 300: 3 units  CBG 301 - 350: 4 units  CBG 351 - 400: 5 units  CBG > 400: call MD and obtain STAT lab verification Maggie Schooner, MD Do Not Order at Discharge  insulin  aspart (novoLOG ) injection 0-6 Units 0-6 Units, Subcutaneous, 3 times daily with meals, First dose on Fri 08/21/23 at 0800  Correction coverage: Very Sensitive (ESRD/Dialysis)  CBG < 70: CBG 70 - 120: 0 units  CBG 121 - 150: 0 units  CBG 151 - 200: 1 unit  CBG 201-250: 2 units  CBG 251-300: 3 units  CBG 301-350: 4 units  CBG 351-400: 5 units  CBG > 400: Give 6 units  insulin  aspart (novoLOG ) injection 3 Units 3 Units, Subcutaneous, 3 times daily with meals,       Current Medications (08/25/2023):  This is the current hospital active medication list Current Facility-Administered Medications  Medication Dose Route Frequency Provider Last Rate Last Admin   acetaminophen  (TYLENOL ) tablet 650 mg  650 mg Oral Q6H PRN Sundil, Subrina, MD   650 mg at 08/25/23 0548   Or   acetaminophen  (TYLENOL ) suppository 650 mg  650 mg Rectal Q6H PRN Sundil, Subrina, MD       amLODipine  (NORVASC ) tablet 5 mg  5 mg Oral Daily Amin, Ankit C, MD       bisacodyl  (DULCOLAX) EC tablet 10 mg  10 mg Oral BID Amin, Ankit C, MD   10 mg at 08/24/23 2130   buPROPion  (WELLBUTRIN  XL) 24 hr tablet 150 mg  150 mg Oral Daily Sundil, Subrina, MD   150 mg at 08/25/23 0845   carvedilol  (COREG ) tablet 6.25 mg  6.25 mg Oral BID WC Amin, Ankit C, MD       cefTRIAXone  (ROCEPHIN ) 2 g in  sodium chloride  0.9 % 100 mL IVPB  2 g Intravenous Q24H Amin, Ankit C, MD 200 mL/hr at 08/24/23 1607 2 g at 08/24/23 1607   Chlorhexidine  Gluconate Cloth 2 % PADS 6 each  6 each Topical Daily Amin, Ankit C, MD   6 each at 08/24/23 1000   docusate sodium  (COLACE) capsule 100 mg  100 mg Oral BID Amin, Ankit C, MD   100 mg at 08/24/23 2130   fentaNYL  (SUBLIMAZE ) injection 25 mcg  25 mcg Intravenous Once Chavez, Abigail, NP       folic acid (FOLVITE) tablet 1 mg  1 mg Oral Daily Amin, Ankit C, MD   1 mg at 08/24/23 0926   glucagon (human recombinant) (  GLUCAGEN) injection 1 mg  1 mg Intravenous PRN Amin, Ankit C, MD       guaiFENesin (ROBITUSSIN) 100 MG/5ML liquid 5 mL  5 mL Oral Q4H PRN Amin, Ankit C, MD       hydrALAZINE  (APRESOLINE ) injection 10 mg  10 mg Intravenous Q4H PRN Amin, Ankit C, MD       insulin  aspart (novoLOG ) injection 0-5 Units  0-5 Units Subcutaneous QHS Sundil, Subrina, MD   3 Units at 08/24/23 2130   insulin  aspart (novoLOG ) injection 0-6 Units  0-6 Units Subcutaneous TID WC Sundil, Subrina, MD   3 Units at 08/24/23 1816   insulin  aspart (novoLOG ) injection 3 Units  3 Units Subcutaneous TID WC Amin, Ankit C, MD       insulin  glargine-yfgn (SEMGLEE ) injection 10 Units  10 Units Subcutaneous Daily Amin, Ankit C, MD       ipratropium-albuterol (DUONEB) 0.5-2.5 (3) MG/3ML nebulizer solution 3 mL  3 mL Nebulization Q4H PRN Amin, Ankit C, MD   3 mL at 08/23/23 1246   levothyroxine  (SYNTHROID ) tablet 50 mcg  50 mcg Oral QAC breakfast Sundil, Subrina, MD   50 mcg at 08/25/23 0548   metoprolol  tartrate (LOPRESSOR ) injection 5 mg  5 mg Intravenous Q4H PRN Amin, Ankit C, MD       morphine  (PF) 2 MG/ML injection 2 mg  2 mg Intravenous Q4H PRN Amin, Ankit C, MD   2 mg at 08/25/23 0402   naloxone  (NARCAN ) injection 0.4 mg  0.4 mg Intravenous PRN Sundil, Subrina, MD       ondansetron  (ZOFRAN ) tablet 4 mg  4 mg Oral Q6H PRN Sundil, Subrina, MD       Or   ondansetron  (ZOFRAN ) injection 4 mg  4 mg  Intravenous Q6H PRN Sundil, Subrina, MD   4 mg at 08/25/23 0420   Oral care mouth rinse  15 mL Mouth Rinse PRN Amin, Ankit C, MD       oxyCODONE  (Oxy IR/ROXICODONE ) immediate release tablet 5 mg  5 mg Oral Q4H PRN Amin, Ankit C, MD   5 mg at 08/25/23 0548   pantoprazole  (PROTONIX ) EC tablet 40 mg  40 mg Oral QAC breakfast Amin, Ankit C, MD   40 mg at 08/25/23 0845   polyethylene glycol (MIRALAX  / GLYCOLAX ) packet 17 g  17 g Oral Daily Amin, Ankit C, MD   17 g at 08/24/23 0926   senna-docusate (Senokot-S) tablet 1 tablet  1 tablet Oral QHS PRN Amin, Ankit C, MD   1 tablet at 08/24/23 1818   sertraline  (ZOLOFT ) tablet 150 mg  150 mg Oral Daily Sundil, Subrina, MD   150 mg at 08/25/23 0846   sodium chloride  flush (NS) 0.9 % injection 3 mL  3 mL Intravenous Q12H Sundil, Subrina, MD   3 mL at 08/24/23 2130   sodium chloride  flush (NS) 0.9 % injection 3 mL  3 mL Intravenous Q12H Sundil, Subrina, MD   3 mL at 08/24/23 2130   sodium chloride  flush (NS) 0.9 % injection 3 mL  3 mL Intravenous PRN Sundil, Subrina, MD       tamsulosin (FLOMAX) capsule 0.4 mg  0.4 mg Oral QPM Sundil, Subrina, MD   0.4 mg at 08/24/23 1744   traZODone (DESYREL) tablet 50 mg  50 mg Oral QHS PRN Amin, Ankit C, MD         Discharge Medications: Please see discharge summary for a list of discharge medications.  Relevant Imaging Results:  Relevant  Lab Results:   Additional Information SSN [id number]  Kathryn Parish, RN

## 2023-08-25 NOTE — Progress Notes (Signed)
 [facility] Liaison Note  08/25/2023  SHLOME GARRIDO [birth year] [id number]  Location: RN Hospital Liaison screened the patient remotely at [facility]  Insurance: [employer]   TRAEGAN CHARGUALAF is a 78 y.o. male who is a Primary Care Patient of Patient, [name]  (facility provider).The patient was screened for 7 day readmission hospitalization with noted high risk score for unplanned readmission risk with 3 IP in 6 months.  Review of patient's electronic medical record reveals patient was admitted for UTI. Pt will return to the LTC at [facility] in [place]. No VBCI needed at this time.Aaron Aas   VBCI Care Management/Population Health does not replace or interfere with any arrangements made by the Inpatient Transition of Care team.   For questions contact:   Lilla Reichert, RN, BSN Hospital Liaison [place]   [facility], Population Health Office Hours MTWF  8:00 am-6:00 pm Direct Dial: [phone] [email or web address]

## 2023-08-25 NOTE — Consult Note (Signed)
 WOC Nurse Consult Note: Reason for Consult: scrotum wound  Wound type: ? Deep Tissue Pressure Injury to scrotum evolving with partial thickness skin loss  Pressure Injury POA: no  Measurement: see nursing flowsheet  Wound bed: purple maroon discoloration distal aspect; red moist  Drainage (amount, consistency, odor) see nursing flowsheet  Periwound: intact  Dressing procedure/placement/frequency:  Cleanse scrotum with soap and water, dry and apply Xeroform gauze ([place] [phone]) to wound bed daily, cover with ABD pad, may use mesh underwear to attempt to hold dressing in place.   POC discussed with bedside nurse. WOC team will follow every 7 to 10 days.    Thank you,    Ronni Colace MSN, RN-BC, [employer] [phone]

## 2023-08-25 NOTE — Progress Notes (Signed)
 Physical Therapy Treatment Patient Details Name: [name] MRN: [id number] DOB: [birth year] Today's Date: 08/25/2023   History of Present Illness [name] is a 78 y/o male admitted from [facility] 08/20/23 with flank pain, severe sepsis 2* UTI and acute metabolic encephalopathy. Pt with recent admit to [facility] with kidney stones and s/p bil ureteral stent placement 07/13/23.  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    PT Comments  Pt in bed, lethargic, but agreeable to PT session. He requires mod A to come to sit EOB and requires inc time to achieve self balance sitting EOB. Agreeable to stand, mod A x2 with RW for standing, able to maintain position x3-5 mins before returning to sitting position. Pt motivated to improve his tolerance and abilities with ADLs and mobility. Encouraged to continue with frequent changes in position and transfers to work towards increased tolerance and strength consistent with these tasks.     If plan is discharge home, recommend the following:     Can travel by private vehicle     No  Equipment Recommendations  None recommended by PT    Recommendations for Other Services       Precautions / Restrictions Precautions Precautions: Fall Restrictions Weight Bearing Restrictions Per Provider Order: No     Mobility  Bed Mobility Overal bed mobility: Needs Assistance Bed Mobility: Rolling, Sidelying to Sit Rolling: Mod assist Sidelying to sit: +2 for physical assistance, Mod assist            Transfers   Equipment used: Rolling walker (2 wheels) Transfers: Sit to/from Stand Sit to Stand: Mod assist, +2 physical assistance                Ambulation/Gait                   Stairs             Wheelchair Mobility     Tilt Bed    Modified Rankin (Stroke Patients Only)       Balance Overall  balance assessment: Needs assistance Sitting-balance support: Feet supported, No upper extremity supported Sitting balance-Leahy Scale: Fair Sitting balance - Comments: intermittent ability to keep self upright, intially requires physical assist   Standing balance support: Bilateral upper extremity supported Standing balance-Leahy Scale: Poor                              Communication Communication Communication: Impaired Factors Affecting Communication: Hearing impaired (B hearing aides)  Cognition Arousal: Lethargic Behavior During Therapy: Flat affect   PT - Cognitive impairments: No apparent impairments                         Following commands: Impaired Following commands impaired: Only follows one step commands consistently    Cueing Cueing Techniques: Verbal cues, Gestural cues, Tactile cues  Exercises      General Comments        Pertinent Vitals/Pain Pain Assessment Pain Assessment: Faces Pain Score: 0-No pain Pain Intervention(s): Monitored during session    Home Living                          Prior Function            PT Goals (current goals  can now be found in the care plan section) Acute Rehab PT Goals Patient Stated Goal: continue rehab to get strong enough to go home PT Goal Formulation: With patient Time For Goal Achievement: 09/05/23 Potential to Achieve Goals: Fair Progress towards PT goals: Progressing toward goals    Frequency    Min 2X/week      PT Plan      Co-evaluation              AM-PAC PT "6 Clicks" Mobility   Outcome Measure  Help needed turning from your back to your side while in a flat bed without using bedrails?: A Lot Help needed moving from lying on your back to sitting on the side of a flat bed without using bedrails?: A Lot Help needed moving to and from a bed to a chair (including a wheelchair)?: A Lot Help needed standing up from a chair using your arms (e.g., wheelchair  or bedside chair)?: A Lot Help needed to walk in hospital room?: Total Help needed climbing 3-5 steps with a railing? : Total 6 Click Score: 10    End of Session Equipment Utilized During Treatment: Gait belt Activity Tolerance: Patient limited by fatigue Patient left: with call bell/phone within reach;in bed;with bed alarm set Nurse Communication: Mobility status PT Visit Diagnosis: Unsteadiness on feet (R26.81);Muscle weakness (generalized) (M62.81);Difficulty in walking, not elsewhere classified (R26.2);Pain     Time: 9147-8295 PT Time Calculation (min) (ACUTE ONLY): 23 min  Charges:    $Therapeutic Activity: 23-37 mins PT General Charges $$ ACUTE PT VISIT: 1 Visit                     [name], PT Acute Rehabilitation Services Office: [phone] 08/25/2023    [name] 08/25/2023, 4:19 PM

## 2023-08-25 NOTE — Progress Notes (Signed)
 PROGRESS NOTE    [name]  ZOX:[id number] DOB: [birth year] DOA: 08/20/2023 PCP: Patient, [name]    Brief Narrative:  78 year old with history of renal stones status post bilateral stents 07/13/2023, DM2, HTN, HLD, CAD, paroxysmal A-fib, hypothyroidism, chronic pain, ataxia, GERD, hypothyroidism, chronic anemia comes to the ED for abnormal lab, hypotension and abdominal pain.  Upon admission CT renal stone study done, reviewed by urology showing good stent placement but recommended Foley catheter placement.  Currently started on empiric IV Rocephin .  PT/OT is recommending SNF.   Assessment & Plan:  Principal Problem:   Severe sepsis (HCC) Active Problems:   Sepsis secondary to UTI (HCC)   Acute metabolic encephalopathy   Hyperkalemia   Hyponatremia   Thrombocytopenia (HCC)   Insulin  dependent type 2 diabetes mellitus (HCC)   Hyperlipidemia   Essential hypertension   Hypothyroidism   Nephrolithiasis   Paroxysmal atrial fibrillation (HCC)   History of CAD (coronary artery disease)   History of ataxia   Bilateral ureteral stent present   Hematuria    Severe sepsis Sepsis likely secondary from UTI Bilateral nephrolithiasis s/p lithotripsy and ureteral stent 07/13/2023 Hematuria Concerns of urinary tract infection.  Sepsis have improved.  No obvious evidence of obstruction.  Cultures growing E. coli, continue IV Rocephin , will transition to p.o with total 10-day course.Aaron Aas  Foley in place at urology request.  Foley catheter and stent management per urology  Plan to do a Bactrim challenge to make sure he is not allergic to sulfa.  If he passes, plan total 10 days of antibiotics.  Urology to be notified the day before placement at SNF, they will remove the stent just prior to his discharge.  Acute kidney injury, essentially resolved - Baseline creatinine 0.7, admission creatinine 3.17.  Foley placed received IV fluids, creatinine 0.91 today    Thrombocytopenia Likely in  setting of sepsis.  Closely monitor No evidence of bleeding  Folic acid deficiency - Folate supplement started   Acute metabolic encephalopathy-multifactorial - Suspected polypharmacy versus infection.  Improved after Narcan  in ER   Metabolic acidosis  - Resolved with improvement in renal function   Paroxysmal atrial fibrillation - Resume Coreg .  Not on long-term anticoagulation.   History of CAD -Holding aspirin  and Plavix  in the setting of hematuria and new development of thrombocytopenia.  Will resume once platelet count will be improved   Essential hypertension - Will resume Coreg  and Norvasc  today   Hypothyroidism -Continue levothyroxine    Insulin -dependent DM type II, uncontrolled due to hyperglycemia - A1c 9.4.  Sliding scale and Accu-Chek.  Adjusting long-acting and Premeal insulin      Generalized anxiety disorder - Continue Zoloft  and Wellbutrin     DVT prophylaxis: SCD's    Code Status: Full Code Family Communication: Sister at bedside Status is: Inpatient I will discharge this patient to SNF once cleared by urology.    Subjective: Appears much comfortable and calm.  No acute events.  Examination:  General exam: Appears calm and comfortable  Respiratory system: Clear to auscultation. Respiratory effort normal. Cardiovascular system: S1 & S2 heard, RRR. No JVD, murmurs, rubs, gallops or clicks. No pedal edema. Gastrointestinal system: Abdomen is nondistended, soft and nontender. No organomegaly or masses felt. Normal bowel sounds heard. Central nervous system: Alert and oriented. No focal neurological deficits. Extremities: Symmetric 5 x 5 power. Skin: No rashes, lesions or ulcers Psychiatry: Judgement and insight appear normal. Mood & affect appropriate. Foley in place.  Diet Orders (From admission, onward)     Start     Ordered   08/21/23 0035  Diet heart healthy/carb modified Room service appropriate? Yes; Fluid  consistency: Thin  Diet effective now       Question Answer Comment  Diet-HS Snack? Nothing   Room service appropriate? Yes   Fluid consistency: Thin      08/21/23 0034            Objective: Vitals:   08/24/23 1921 08/25/23 0427 08/25/23 0921 08/25/23 1227  BP: (!) 144/76 (!) 167/79 (!) 167/79 (!) 151/76  Pulse: 94 83  85  Resp: 18 19  18   Temp: 98.5 F (36.9 C) 98.5 F (36.9 C)  98.7 F (37.1 C)  TempSrc: Oral Oral  Oral  SpO2: 97% 99%  99%  Weight:      Height:        Intake/Output Summary (Last 24 hours) at 08/25/2023 1335 Last data filed at 08/25/2023 1300 Gross per 24 hour  Intake --  Output 1300 ml  Net -1300 ml   Filed Weights   08/21/23 0406 08/21/23 0900  Weight: 111.1 kg 120.5 kg    Scheduled Meds:  amLODipine   5 mg Oral Daily   bisacodyl   10 mg Oral BID   buPROPion   150 mg Oral Daily   carvedilol   6.25 mg Oral BID WC   Chlorhexidine  Gluconate Cloth  6 each Topical Daily   docusate sodium   100 mg Oral BID   fentaNYL  (SUBLIMAZE ) injection  25 mcg Intravenous Once   folic acid  1 mg Oral Daily   insulin  aspart  0-5 Units Subcutaneous QHS   insulin  aspart  0-6 Units Subcutaneous TID WC   insulin  aspart  3 Units Subcutaneous TID WC   insulin  glargine-yfgn  10 Units Subcutaneous Daily   levothyroxine   50 mcg Oral QAC breakfast   pantoprazole   40 mg Oral QAC breakfast   polyethylene glycol  17 g Oral Daily   sertraline   150 mg Oral Daily   sodium chloride  flush  3 mL Intravenous Q12H   sodium chloride  flush  3 mL Intravenous Q12H   tamsulosin  0.4 mg Oral QPM   Continuous Infusions:  cefTRIAXone  (ROCEPHIN )  IV 2 g (08/24/23 1607)    Nutritional status     Body mass index is 39.23 kg/m.  Data Reviewed:   CBC: Recent Labs  Lab 08/20/23 2145 08/21/23 0429 08/22/23 0257 08/23/23 0503 08/24/23 0425 08/25/23 0422  WBC 11.1* 10.9* 7.8 8.2 10.4 13.2*  NEUTROABS 10.0*  --   --   --   --   --   HGB 11.9* 11.5* 11.9* 12.1* 13.5 12.1*  HCT  36.2* 34.7* 38.3* 38.0* 42.5 38.3*  MCV 95.3 95.6 98.7 95.5 96.2 96.0  PLT 89* 73*  75* 63* 66* 81* 113*   Basic Metabolic Panel: Recent Labs  Lab 08/21/23 0429 08/22/23 0257 08/23/23 0503 08/24/23 0425 08/25/23 0422  NA 130* 132* 130* 131* 137  K 4.8 4.6 4.4 3.9 3.9  CL 98 103 103 103 109  CO2 21* 17* 17* 18* 22  GLUCOSE 93 105* 229* 270* 267*  BUN 53* 59* 72* 66* 55*  CREATININE 2.70* 2.26* 2.06* 1.39* 0.91  CALCIUM  8.0* 7.7* 7.6* 7.6* 7.9*  MG  --  1.4* 2.3 2.5* 2.2  PHOS  --  3.3  --   --   --    GFR: Estimated Creatinine Clearance: 89.9 mL/min (by C-G formula based on SCr of  0.91 mg/dL). Liver Function Tests: Recent Labs  Lab 08/20/23 2145 08/21/23 0429  AST 28 23  ALT 21 18  ALKPHOS 63 67  BILITOT 0.7 0.9  PROT 5.9* 5.5*  ALBUMIN 2.8* 2.5*   No results for input(s): "LIPASE", "AMYLASE" in the last 168 hours. Recent Labs  Lab 08/21/23 1019  AMMONIA 16   Coagulation Profile: Recent Labs  Lab 08/20/23 2145 08/21/23 0429 08/21/23 0747  INR 1.2 1.2 1.2   Cardiac Enzymes: No results for input(s): "CKTOTAL", "CKMB", "CKMBINDEX", "TROPONINI" in the last 168 hours. BNP (last 3 results) No results for input(s): "PROBNP" in the last 8760 hours. HbA1C: No results for input(s): "HGBA1C" in the last 72 hours. CBG: Recent Labs  Lab 08/24/23 1139 08/24/23 1653 08/24/23 2057 08/25/23 0723 08/25/23 1139  GLUCAP 302* 298* 258* 224* 222*   Lipid Profile: No results for input(s): "CHOL", "HDL", "LDLCALC", "TRIG", "CHOLHDL", "LDLDIRECT" in the last 72 hours. Thyroid  Function Tests: No results for input(s): "TSH", "T4TOTAL", "FREET4", "T3FREE", "THYROIDAB" in the last 72 hours. Anemia Panel: No results for input(s): "VITAMINB12", "FOLATE", "FERRITIN", "TIBC", "IRON", "RETICCTPCT" in the last 72 hours. Sepsis Labs: Recent Labs  Lab 08/21/23 0033 08/21/23 0329 08/21/23 0429 08/21/23 0747  LATICACIDVEN 4.8* 3.0* 3.2* 2.2*    Recent Results (from the  past 240 hours)  Blood Culture (routine x 2)     Status: Abnormal   Collection Time: 08/20/23  9:53 PM   Specimen: BLOOD LEFT FOREARM  Result Value Ref Range Status   Specimen Description   Final    BLOOD LEFT FOREARM Performed at [facility], [address] [place], [place] [address]    Special Requests   Final    BOTTLES DRAWN AEROBIC AND ANAEROBIC Blood Culture results may not be optimal due to an inadequate volume of blood received in culture bottles Performed at [facility], [address], [place], [place] [address]    Culture  Setup Time   Final    GRAM NEGATIVE RODS IN BOTH AEROBIC AND ANAEROBIC BOTTLES CRITICAL VALUE NOTED.  VALUE IS CONSISTENT WITH PREVIOUSLY REPORTED AND CALLED VALUE.    Culture (A)  Final    ESCHERICHIA COLI SUSCEPTIBILITIES PERFORMED ON PREVIOUS CULTURE WITHIN THE LAST 5 DAYS. Performed at [facility], [address], [place], [place] [address]    Report Status 08/23/2023 FINAL  Final  Blood Culture (routine x 2)     Status: Abnormal   Collection Time: 08/20/23  9:53 PM   Specimen: BLOOD RIGHT HAND  Result Value Ref Range Status   Specimen Description   Final    BLOOD RIGHT HAND Performed at [facility], [address] [place], [place] [address]    Special Requests   Final    BOTTLES DRAWN AEROBIC AND ANAEROBIC Blood Culture adequate volume Performed at [facility], [address], [place], [place] [address]    Culture  Setup Time   Final    GRAM NEGATIVE RODS IN BOTH AEROBIC AND ANAEROBIC BOTTLES CRITICAL RESULT CALLED TO, READ BACK BY AND VERIFIED WITH: PHARMD CHRISTINE SHADE [phone] [id number] BY EC Performed at [facility], [address] [place], [place] [address]    Culture ESCHERICHIA COLI (A)  Final   Report Status 08/23/2023 FINAL  Final   Organism ID, Bacteria ESCHERICHIA COLI  Final   Organism ID, Bacteria ESCHERICHIA COLI  Final      Susceptibility   Escherichia  coli -  KIRBY BAUER*    CEFAZOLIN  RESISTANT Resistant    Escherichia coli - MIC*    AMPICILLIN >=32 RESISTANT Resistant     CEFEPIME  <=0.12 SENSITIVE Sensitive     CEFTAZIDIME <=1 SENSITIVE Sensitive     CEFTRIAXONE  <=0.25 SENSITIVE Sensitive     CIPROFLOXACIN  >=4 RESISTANT Resistant     GENTAMICIN <=1 SENSITIVE Sensitive     IMIPENEM <=0.25 SENSITIVE Sensitive     TRIMETH/SULFA <=20 SENSITIVE Sensitive     AMPICILLIN/SULBACTAM >=32 RESISTANT Resistant     PIP/TAZO 8 SENSITIVE Sensitive ug/mL    * ESCHERICHIA COLI    ESCHERICHIA COLI  Blood Culture ID Panel (Reflexed)     Status: Abnormal   Collection Time: 08/20/23  9:53 PM  Result Value Ref Range Status   Enterococcus faecalis NOT DETECTED NOT DETECTED Final   Enterococcus Faecium NOT DETECTED NOT DETECTED Final   Listeria monocytogenes NOT DETECTED NOT DETECTED Final   Staphylococcus species NOT DETECTED NOT DETECTED Final   Staphylococcus aureus (BCID) NOT DETECTED NOT DETECTED Final   Staphylococcus epidermidis NOT DETECTED NOT DETECTED Final   Staphylococcus lugdunensis NOT DETECTED NOT DETECTED Final   Streptococcus species NOT DETECTED NOT DETECTED Final   Streptococcus agalactiae NOT DETECTED NOT DETECTED Final   Streptococcus pneumoniae NOT DETECTED NOT DETECTED Final   Streptococcus pyogenes NOT DETECTED NOT DETECTED Final   A.calcoaceticus-baumannii NOT DETECTED NOT DETECTED Final   Bacteroides fragilis NOT DETECTED NOT DETECTED Final   Enterobacterales DETECTED (A) NOT DETECTED Final    Comment: Enterobacterales represent a large order of gram negative bacteria, not a single organism. CRITICAL RESULT CALLED TO, READ BACK BY AND VERIFIED WITH: PHARMD CHRISTINE SHADE [id number] AT 1516 BY EC    Enterobacter cloacae complex NOT DETECTED NOT DETECTED Final   Escherichia coli DETECTED (A) NOT DETECTED Final    Comment: CRITICAL RESULT CALLED TO, READ BACK BY AND VERIFIED WITH: PHARMD CHRISTINE SHADE [id number] AT 1516 BY  EC    Klebsiella aerogenes NOT DETECTED NOT DETECTED Final   Klebsiella oxytoca NOT DETECTED NOT DETECTED Final   Klebsiella pneumoniae NOT DETECTED NOT DETECTED Final   Proteus species NOT DETECTED NOT DETECTED Final   Salmonella species NOT DETECTED NOT DETECTED Final   Serratia marcescens NOT DETECTED NOT DETECTED Final   Haemophilus influenzae NOT DETECTED NOT DETECTED Final   Neisseria meningitidis NOT DETECTED NOT DETECTED Final   Pseudomonas aeruginosa NOT DETECTED NOT DETECTED Final   Stenotrophomonas maltophilia NOT DETECTED NOT DETECTED Final   Candida albicans NOT DETECTED NOT DETECTED Final   Candida auris NOT DETECTED NOT DETECTED Final   Candida glabrata NOT DETECTED NOT DETECTED Final   Candida krusei NOT DETECTED NOT DETECTED Final   Candida parapsilosis NOT DETECTED NOT DETECTED Final   Candida tropicalis NOT DETECTED NOT DETECTED Final   Cryptococcus neoformans/gattii NOT DETECTED NOT DETECTED Final   CTX-M ESBL NOT DETECTED NOT DETECTED Final   Carbapenem resistance IMP NOT DETECTED NOT DETECTED Final   Carbapenem resistance KPC NOT DETECTED NOT DETECTED Final   Carbapenem resistance NDM NOT DETECTED NOT DETECTED Final   Carbapenem resist OXA 48 LIKE NOT DETECTED NOT DETECTED Final   Carbapenem resistance VIM NOT DETECTED NOT DETECTED Final    Comment: Performed at [facility], [address], [place], [place] [address]  Urine Culture     Status: Abnormal   Collection Time: 08/20/23 10:36 PM   Specimen: Urine, Random  Result Value Ref Range Status   Specimen Description  Final    URINE, RANDOM Performed at [facility], [address], [place], [place] [address]    Special Requests   Final    NONE Reflexed from [id number] Performed at [facility], [address], [place], [place] [address]    Culture (A)  Final    <10,000 COLONIES/mL INSIGNIFICANT GROWTH Performed at [facility], [address]  [place], [place] [address]    Report Status 08/22/2023 FINAL  Final         Radiology Studies: No results found.         LOS: 4 days   Time spent= 35 mins    Maggie Schooner, MD [facility]  If 7PM-7AM, please contact night-coverage  08/25/2023, 1:35 PM

## 2023-08-25 NOTE — Progress Notes (Addendum)
 Pharmacy: Antimicrobial Stewardship Note  44 YOM with E.coli urosepsis in the setting of B/L ureteral stones s/p stent placement on 5/6.   E.coli sensitivities noted below:  Susceptibility    Escherichia coli (ZZ00) Escherichia coli (ZZ01)    KIRBY BAUER MIC    AMPICILLIN   >=32 RESIST... Resistant    AMPICILLIN/SULBACTAM   >=32 RESIST... Resistant    CEFAZOLIN  RESISTANT Resistant      CEFEPIME    <=0.12 SENS... Sensitive    CEFTAZIDIME   <=1 SENSITIVE Sensitive    CEFTRIAXONE    <=0.25 SENS... Sensitive    CIPROFLOXACIN    >=4 RESISTANT Resistant    GENTAMICIN   <=1 SENSITIVE Sensitive    IMIPENEM   <=0.25 SENS... Sensitive    PIP/TAZO   8 SENSITIVE... Sensitive    TRIMETH/SULFA   <=20 SENSIT... Sensitive      The only oral option available for step-down therapy is Bactrim which the patient is noted to have an allergy to - listed as a rash. Upon further discussion with the patient he stated he was young (8-9 YO) and does not recall the reaction. Given this is the only oral option with rash listed as the reaction - reasonable to trial a dose in the hospital with monitoring. Discussed with Dr. Ariel Begun and decision was made to give a dose here to see how he tolerates. Plan and monitoring discussed with the patient's RN.  Addendum: The patient tolerated the 1x oral dose of Bactrim without notable issues, will transition over per discussion w/ the MD and continue to monitor.  08/25/2023 3:18 PM   Thank you for allowing pharmacy to be a part of this patient's care.  Garland Junk, PharmD, BCPS, BCIDP Infectious Diseases Clinical Pharmacist 08/25/2023 11:43 AM   **Pharmacist phone directory can now be found on [email or web address] ([email or web address]).  Listed under [facility].

## 2023-08-25 NOTE — Plan of Care (Signed)
 Problem: Education: Goal: Ability to describe self-care measures that may prevent or decrease complications (Diabetes Survival Skills Education) will improve 08/25/2023 0537 by Adriana Hopping, RN Outcome: Progressing 08/25/2023 0537 by Adriana Hopping, RN Outcome: Progressing Goal: Individualized Educational Video(s) 08/25/2023 0537 by Adriana Hopping, RN Outcome: Progressing 08/25/2023 0537 by Adriana Hopping, RN Outcome: Progressing   Problem: Coping: Goal: Ability to adjust to condition or change in health will improve 08/25/2023 0537 by Adriana Hopping, RN Outcome: Progressing 08/25/2023 0537 by Adriana Hopping, RN Outcome: Progressing   Problem: Fluid Volume: Goal: Ability to maintain a balanced intake and output will improve 08/25/2023 0537 by Adriana Hopping, RN Outcome: Progressing 08/25/2023 0537 by Adriana Hopping, RN Outcome: Progressing   Problem: Health Behavior/Discharge Planning: Goal: Ability to identify and utilize available resources and services will improve 08/25/2023 0537 by Adriana Hopping, RN Outcome: Progressing 08/25/2023 0537 by Adriana Hopping, RN Outcome: Progressing Goal: Ability to manage health-related needs will improve 08/25/2023 0537 by Adriana Hopping, RN Outcome: Progressing 08/25/2023 0537 by Adriana Hopping, RN Outcome: Progressing   Problem: Metabolic: Goal: Ability to maintain appropriate glucose levels will improve 08/25/2023 0537 by Adriana Hopping, RN Outcome: Progressing 08/25/2023 0537 by Adriana Hopping, RN Outcome: Progressing   Problem: Nutritional: Goal: Maintenance of adequate nutrition will improve 08/25/2023 0537 by Adriana Hopping, RN Outcome: Progressing 08/25/2023 0537 by Adriana Hopping, RN Outcome: Progressing Goal: Progress toward achieving an optimal weight will improve 08/25/2023 0537 by Adriana Hopping, RN Outcome: Progressing 08/25/2023 0537 by Adriana Hopping, RN Outcome: Progressing   Problem: Skin  Integrity: Goal: Risk for impaired skin integrity will decrease 08/25/2023 0537 by Adriana Hopping, RN Outcome: Progressing 08/25/2023 0537 by Adriana Hopping, RN Outcome: Progressing   Problem: Tissue Perfusion: Goal: Adequacy of tissue perfusion will improve 08/25/2023 0537 by Adriana Hopping, RN Outcome: Progressing 08/25/2023 0537 by Adriana Hopping, RN Outcome: Progressing   Problem: Education: Goal: Knowledge of General Education information will improve Description: Including pain rating scale, medication(s)/side effects and non-pharmacologic comfort measures 08/25/2023 0537 by Adriana Hopping, RN Outcome: Progressing 08/25/2023 0537 by Adriana Hopping, RN Outcome: Progressing   Problem: Health Behavior/Discharge Planning: Goal: Ability to manage health-related needs will improve 08/25/2023 0537 by Adriana Hopping, RN Outcome: Progressing 08/25/2023 0537 by Adriana Hopping, RN Outcome: Progressing   Problem: Clinical Measurements: Goal: Ability to maintain clinical measurements within normal limits will improve 08/25/2023 0537 by Adriana Hopping, RN Outcome: Progressing 08/25/2023 0537 by Adriana Hopping, RN Outcome: Progressing Goal: Will remain free from infection 08/25/2023 0537 by Adriana Hopping, RN Outcome: Progressing 08/25/2023 0537 by Adriana Hopping, RN Outcome: Progressing Goal: Diagnostic test results will improve 08/25/2023 0537 by Adriana Hopping, RN Outcome: Progressing 08/25/2023 0537 by Adriana Hopping, RN Outcome: Progressing Goal: Respiratory complications will improve 08/25/2023 0537 by Adriana Hopping, RN Outcome: Progressing 08/25/2023 0537 by Adriana Hopping, RN Outcome: Progressing Goal: Cardiovascular complication will be avoided 08/25/2023 0537 by Adriana Hopping, RN Outcome: Progressing 08/25/2023 0537 by Adriana Hopping, RN Outcome: Progressing   Problem: Activity: Goal: Risk for activity intolerance will decrease 08/25/2023 0537 by Adriana Hopping, RN Outcome: Progressing 08/25/2023 0537 by Adriana Hopping, RN Outcome: Progressing   Problem: Nutrition: Goal: Adequate nutrition will be maintained 08/25/2023 0537 by Adriana Hopping, RN Outcome: Progressing 08/25/2023 0537 by Adriana Hopping, RN Outcome: Progressing  Problem: Coping: Goal: Level of anxiety will decrease 08/25/2023 0537 by Adriana Hopping, RN Outcome: Progressing 08/25/2023 0537 by Adriana Hopping, RN Outcome: Progressing   Problem: Elimination: Goal: Will not experience complications related to bowel motility 08/25/2023 0537 by Adriana Hopping, RN Outcome: Progressing 08/25/2023 0537 by Adriana Hopping, RN Outcome: Progressing Goal: Will not experience complications related to urinary retention 08/25/2023 0537 by Adriana Hopping, RN Outcome: Progressing 08/25/2023 0537 by Adriana Hopping, RN Outcome: Progressing   Problem: Pain Managment: Goal: General experience of comfort will improve and/or be controlled 08/25/2023 0537 by Adriana Hopping, RN Outcome: Progressing 08/25/2023 0537 by Adriana Hopping, RN Outcome: Progressing   Problem: Safety: Goal: Ability to remain free from injury will improve 08/25/2023 0537 by Adriana Hopping, RN Outcome: Progressing 08/25/2023 0537 by Adriana Hopping, RN Outcome: Progressing   Problem: Skin Integrity: Goal: Risk for impaired skin integrity will decrease 08/25/2023 0537 by Adriana Hopping, RN Outcome: Progressing 08/25/2023 0537 by Adriana Hopping, RN Outcome: Progressing

## 2023-08-26 ENCOUNTER — Inpatient Hospital Stay (HOSPITAL_COMMUNITY)

## 2023-08-26 DIAGNOSIS — R6520 Severe sepsis without septic shock: Secondary | ICD-10-CM | POA: Diagnosis not present

## 2023-08-26 DIAGNOSIS — A419 Sepsis, unspecified organism: Secondary | ICD-10-CM | POA: Diagnosis not present

## 2023-08-26 LAB — CBC
HCT: 35.7 % — ABNORMAL LOW (ref 39.0–52.0)
Hemoglobin: 11.6 g/dL — ABNORMAL LOW (ref 13.0–17.0)
MCH: 30.7 pg (ref 26.0–34.0)
MCHC: 32.5 g/dL (ref 30.0–36.0)
MCV: 94.4 fL (ref 80.0–100.0)
Platelets: 147 10*3/uL — ABNORMAL LOW (ref 150–400)
RBC: 3.78 MIL/uL — ABNORMAL LOW (ref 4.22–5.81)
RDW: 14.7 % (ref 11.5–15.5)
WBC: 18.2 10*3/uL — ABNORMAL HIGH (ref 4.0–10.5)
nRBC: 0 % (ref 0.0–0.2)

## 2023-08-26 LAB — URINALYSIS, ROUTINE W REFLEX MICROSCOPIC
Bilirubin Urine: NEGATIVE
Glucose, UA: 150 mg/dL — AB
Ketones, ur: NEGATIVE mg/dL
Nitrite: NEGATIVE
Protein, ur: NEGATIVE mg/dL
Specific Gravity, Urine: 1.013 (ref 1.005–1.030)
pH: 5 (ref 5.0–8.0)

## 2023-08-26 LAB — GLUCOSE, CAPILLARY
Glucose-Capillary: 202 mg/dL — ABNORMAL HIGH (ref 70–99)
Glucose-Capillary: 196 mg/dL — ABNORMAL HIGH (ref 70–99)
Glucose-Capillary: 193 mg/dL — ABNORMAL HIGH (ref 70–99)
Glucose-Capillary: 187 mg/dL — ABNORMAL HIGH (ref 70–99)
Glucose-Capillary: 182 mg/dL — ABNORMAL HIGH (ref 70–99)

## 2023-08-26 LAB — BASIC METABOLIC PANEL WITH GFR
Anion gap: 8 (ref 5–15)
BUN: 39 mg/dL — ABNORMAL HIGH (ref 8–23)
CO2: 18 mmol/L — ABNORMAL LOW (ref 22–32)
Calcium: 7.9 mg/dL — ABNORMAL LOW (ref 8.9–10.3)
Chloride: 115 mmol/L — ABNORMAL HIGH (ref 98–111)
Creatinine, Ser: 0.72 mg/dL (ref 0.61–1.24)
GFR, Estimated: >60 mL/min (ref >60)
Glucose, Bld: 213 mg/dL — ABNORMAL HIGH (ref 70–99)
Potassium: 4.5 mmol/L (ref 3.5–5.1)
Sodium: 141 mmol/L (ref 135–145)

## 2023-08-26 LAB — MAGNESIUM: Magnesium: 1.8 mg/dL (ref 1.7–2.4)

## 2023-08-26 MED ORDER — INSULIN GLARGINE-YFGN 100 UNIT/ML ~~LOC~~ SOLN
12.0000 [IU] | Freq: Every day | SUBCUTANEOUS | Status: DC
  Administered 2023-08-27: 12 [IU] via SUBCUTANEOUS
  Filled 2023-08-26: qty 0.12

## 2023-08-26 MED ORDER — SODIUM CHLORIDE 0.9 % IV SOLN
2.0000 g | INTRAVENOUS | Status: DC
  Administered 2023-08-26 – 2023-09-03 (×9): 2 g via INTRAVENOUS
  Filled 2023-08-26 (×9): qty 20

## 2023-08-26 MED ORDER — SMOG ENEMA
960.0000 mL | Freq: Once | RECTAL | Status: AC | PRN
  Administered 2023-08-26: 960 mL via RECTAL
  Filled 2023-08-26: qty 960

## 2023-08-26 MED ORDER — CLOPIDOGREL BISULFATE 75 MG PO TABS
75.0000 mg | ORAL_TABLET | Freq: Every day | ORAL | Status: DC
  Administered 2023-08-26 – 2023-08-27 (×2): 75 mg via ORAL
  Filled 2023-08-26 (×2): qty 1

## 2023-08-26 MED ORDER — FLEET ENEMA RE ENEM
1.0000 | ENEMA | Freq: Every day | RECTAL | Status: DC | PRN
  Administered 2023-08-26: 1 via RECTAL
  Filled 2023-08-26: qty 1

## 2023-08-26 NOTE — Plan of Care (Signed)

## 2023-08-26 NOTE — Progress Notes (Signed)
 PROGRESS NOTE    [name]  VHQ:[id number]  DOB: [birth year]  DOA: 08/20/2023 PCP: Patient, [name] Outpatient Specialists:   Hospital course:  78 year old with history of renal stones status post bilateral stents 07/13/2023, DM2, HTN, HLD, CAD, paroxysmal A-fib, hypothyroidism, chronic pain, ataxia, GERD, hypothyroidism, chronic anemia comes to the ED for abnormal lab, hypotension and abdominal pain.  Upon admission CT renal stone study done, reviewed by urology showing good stent placement but recommended Foley catheter placement.  Currently started on empiric IV Rocephin .  PT/OT is recommending SNF.   Subjective:  Patient is not very communicative but states okay when I ask him how he is doing.  I ask him he is in any discomfort he says No.  States that he has a cough sometimes but not now.  Denies shortness of breath.  Denies abdominal pain or diarrhea.   Objective: Vitals:   08/25/23 1227 08/25/23 2020 08/26/23 0450 08/26/23 0809  BP: (!) 151/76 (!) 153/72 (!) 152/72 (!) 148/63  Pulse: 85 79 84   Resp: 18 16 18    Temp: 98.7 F (37.1 C) 98 F (36.7 C) 98 F (36.7 C)   TempSrc: Oral Oral Oral   SpO2: 99% 98% 95%   Weight:      Height:        Intake/Output Summary (Last 24 hours) at 08/26/2023 1337 Last data filed at 08/26/2023 1148 Gross per 24 hour  Intake 3 ml  Output 1400 ml  Net -1397 ml   Filed Weights   08/21/23 0406 08/21/23 0900  Weight: 111.1 kg 120.5 kg     Exam:  General: Somewhat lethargic patient lying in bed sleeping, arousable by voice alone but slow to respond initially.  Does respond appropriately to questions and offers one-word answers appropriately. Eyes: sclera anicteric, conjuctiva mild injection bilaterally CVS: S1-S2, regular  Respiratory:  decreased air entry bilaterally secondary to decreased inspiratory effort, rales at bases  GI: NABS, protuberant but not distended, firm but not hard, NT  LE: Warm and well-perfused  Data  Reviewed:  Basic Metabolic Panel: Recent Labs  Lab 08/22/23 0257 08/23/23 0503 08/24/23 0425 08/25/23 0422 08/26/23 0448  NA 132* 130* 131* 137 141  K 4.6 4.4 3.9 3.9 4.5  CL 103 103 103 109 115*  CO2 17* 17* 18* 22 18*  GLUCOSE 105* 229* 270* 267* 213*  BUN 59* 72* 66* 55* 39*  CREATININE 2.26* 2.06* 1.39* 0.91 0.72  CALCIUM  7.7* 7.6* 7.6* 7.9* 7.9*  MG 1.4* 2.3 2.5* 2.2 1.8  PHOS 3.3  --   --   --   --     CBC: Recent Labs  Lab 08/20/23 2145 08/21/23 0429 08/22/23 0257 08/23/23 0503 08/24/23 0425 08/25/23 0422 08/26/23 0448  WBC 11.1*   < > 7.8 8.2 10.4 13.2* 18.2*  NEUTROABS 10.0*  --   --   --   --   --   --   HGB 11.9*   < > 11.9* 12.1* 13.5 12.1* 11.6*  HCT 36.2*   < > 38.3* 38.0* 42.5 38.3* 35.7*  MCV 95.3   < > 98.7 95.5 96.2 96.0 94.4  PLT 89*   < > 63* 66* 81* 113* 147*   < > = values in this interval not displayed.     Scheduled Meds:  amLODipine   5 mg Oral Daily   buPROPion   150 mg Oral Daily   carvedilol   6.25 mg Oral BID WC   Chlorhexidine  Gluconate Cloth  6 each Topical Daily   clopidogrel   75 mg Oral Daily   docusate sodium   100 mg Oral BID   fentaNYL  (SUBLIMAZE ) injection  25 mcg Intravenous Once   folic acid  1 mg Oral Daily   insulin  aspart  0-5 Units Subcutaneous QHS   insulin  aspart  0-6 Units Subcutaneous TID WC   insulin  aspart  3 Units Subcutaneous TID WC   insulin  glargine-yfgn  10 Units Subcutaneous Daily   levothyroxine   50 mcg Oral QAC breakfast   pantoprazole   40 mg Oral QAC breakfast   polyethylene glycol  17 g Oral Daily   sertraline   150 mg Oral Daily   sodium chloride  flush  3 mL Intravenous Q12H   sodium chloride  flush  3 mL Intravenous Q12H   tamsulosin  0.4 mg Oral QPM   Continuous Infusions:  cefTRIAXone  (ROCEPHIN )  IV 2 g (08/26/23 0924)     Assessment & Plan:   Rising leukocytosis Because of new leukocytosis is unclear at present Patient is afebrile and denies any particular discomfort or symptoms Patient  had been changed to Bactrim yesterday however the leukocytosis predates that from the day before so I do not think this is the cause of it. Nonetheless will change patient back to ceftriaxone  IV day #6 for E. coli bacteremia Blood culture ordered X-ray ordered UA clean-catch ordered CT abdomen pelvis ordered No change in antibiotics at present  Complicated UTI secondary to E. Coli S/p lithotripsy and ureteral stent 07/13/2023 Patient is being followed by urology who note that renal stone protocol CT without obstruction Patient was treated with Foley which was discontinued and patient is voiding on his own Continue ceftriaxone  day #6 Plan for stent removal on the day prior to discharge  Metabolic encephalopathy To be multifactorial with possible polypharmacy versus infection Currently some improvement after Narcan  in ER Follow clinically closely  AKI Resolved with treatment of infection and fluid resuscitation  DM 2 CBG in high 190s/low 200s Increase glargine to 12 from 10  PAF HTN Continue Coreg  and Norvasc  Not on anticoagulation as an outpatient for primary stroke prevention  CAD ASA and Plavix  were held in setting of hematuria and thrombocytopenia Platelets have rebounded to normal Will restart Plavix  today, can restart aspirin  if no further worsening of hematuria  GAD Continue Zoloft  and Wellbutrin   Disposition Will need to discharge to a to SNF per PT    DVT prophylaxis: SCD Code Status: Full code Family Communication: Spoke with patient's sister ms heilig     Studies: No results found.  Principal Problem:   Severe sepsis (HCC) Active Problems:   Sepsis secondary to UTI (HCC)   Acute metabolic encephalopathy   Hyperkalemia   Hyponatremia   Thrombocytopenia (HCC)   Insulin  dependent type 2 diabetes mellitus (HCC)   Hyperlipidemia   Essential hypertension   Hypothyroidism   Nephrolithiasis   Paroxysmal atrial fibrillation (HCC)   History of CAD  (coronary artery disease)   History of ataxia   Bilateral ureteral stent present   Hematuria     [name] Rollene Clink, [facility]  If 7PM-7AM, please contact night-coverage [email or web address]   LOS: 5 days

## 2023-08-26 NOTE — Inpatient Diabetes Management (Signed)
 Inpatient Diabetes Program Recommendations  AACE/ADA: New Consensus Statement on Inpatient Glycemic Control (2015)  Target Ranges:  Prepandial:   less than 140 mg/dL      Peak postprandial:   less than 180 mg/dL (1-2 hours)      Critically ill patients:  140 - 180 mg/dL   Lab Results  Component Value Date   GLUCAP 196 (H) 08/26/2023   HGBA1C 9.5 (H) 07/14/2023    Review of Glycemic Control  Latest Reference Range & Units 08/25/23 07:23 08/25/23 11:39 08/25/23 17:18 08/25/23 20:22 08/26/23 07:33 08/26/23 12:16  Glucose-Capillary 70 - 99 mg/dL 161 (H) 096 (H) 045 (H) 226 (H) 202 (H) 196 (H)  (H): Data is abnormally high Diabetes history: Type 2 DM Outpatient Diabetes medications: Humalog 0-9 units BID, Metformin  1000 mg BID Current orders for Inpatient glycemic control: Novolog  0-6 units TID & HS, Novolog  3 units TID, Semglee  10 units QD  Inpatient Diabetes Program Recommendations:    Consider increasing Semglee  14 units every day.  Thanks, Marjo Sievert, MSN, RNC-OB Diabetes Coordinator [phone] (8a-5p)

## 2023-08-27 ENCOUNTER — Inpatient Hospital Stay (HOSPITAL_COMMUNITY)

## 2023-08-27 DIAGNOSIS — R6520 Severe sepsis without septic shock: Secondary | ICD-10-CM | POA: Diagnosis not present

## 2023-08-27 DIAGNOSIS — A419 Sepsis, unspecified organism: Secondary | ICD-10-CM | POA: Diagnosis not present

## 2023-08-27 LAB — BASIC METABOLIC PANEL WITH GFR
Anion gap: 5 (ref 5–15)
BUN: 27 mg/dL — ABNORMAL HIGH (ref 8–23)
CO2: 23 mmol/L (ref 22–32)
Calcium: 7.8 mg/dL — ABNORMAL LOW (ref 8.9–10.3)
Chloride: 110 mmol/L (ref 98–111)
Creatinine, Ser: 0.72 mg/dL (ref 0.61–1.24)
GFR, Estimated: >60 mL/min (ref >60)
Glucose, Bld: 217 mg/dL — ABNORMAL HIGH (ref 70–99)
Potassium: 4.2 mmol/L (ref 3.5–5.1)
Sodium: 138 mmol/L (ref 135–145)

## 2023-08-27 LAB — MAGNESIUM: Magnesium: 1.8 mg/dL (ref 1.7–2.4)

## 2023-08-27 LAB — GLUCOSE, CAPILLARY
Glucose-Capillary: 210 mg/dL — ABNORMAL HIGH (ref 70–99)
Glucose-Capillary: 243 mg/dL — ABNORMAL HIGH (ref 70–99)
Glucose-Capillary: 175 mg/dL — ABNORMAL HIGH (ref 70–99)
Glucose-Capillary: 155 mg/dL — ABNORMAL HIGH (ref 70–99)

## 2023-08-27 LAB — CBC
HCT: 33.5 % — ABNORMAL LOW (ref 39.0–52.0)
Hemoglobin: 11 g/dL — ABNORMAL LOW (ref 13.0–17.0)
MCH: 31.3 pg (ref 26.0–34.0)
MCHC: 32.8 g/dL (ref 30.0–36.0)
MCV: 95.2 fL (ref 80.0–100.0)
Platelets: 195 10*3/uL (ref 150–400)
RBC: 3.52 MIL/uL — ABNORMAL LOW (ref 4.22–5.81)
RDW: 14.8 % (ref 11.5–15.5)
WBC: 15.8 10*3/uL — ABNORMAL HIGH (ref 4.0–10.5)
nRBC: 0 % (ref 0.0–0.2)

## 2023-08-27 MED ORDER — SMOG ENEMA
960.0000 mL | Freq: Once | RECTAL | Status: AC
  Administered 2023-08-27: 960 mL via RECTAL
  Filled 2023-08-27: qty 960

## 2023-08-27 MED ORDER — MAGNESIUM HYDROXIDE 400 MG/5ML PO SUSP
30.0000 mL | Freq: Once | ORAL | Status: AC
  Administered 2023-08-27: 30 mL via ORAL
  Filled 2023-08-27: qty 30

## 2023-08-27 MED ORDER — SENNOSIDES-DOCUSATE SODIUM 8.6-50 MG PO TABS
2.0000 | ORAL_TABLET | Freq: Every day | ORAL | Status: DC
  Administered 2023-08-27: 2 via ORAL
  Filled 2023-08-27: qty 2

## 2023-08-27 MED ORDER — HYOSCYAMINE SULFATE 0.125 MG PO TBDP: 0.1250 mg | ORAL_TABLET | ORAL | Status: DC | PRN

## 2023-08-27 MED ORDER — INSULIN GLARGINE-YFGN 100 UNIT/ML ~~LOC~~ SOLN
16.0000 [IU] | Freq: Every day | SUBCUTANEOUS | Status: DC
  Administered 2023-08-28 – 2023-09-03 (×7): 16 [IU] via SUBCUTANEOUS
  Filled 2023-08-27 (×7): qty 0.16

## 2023-08-27 MED ORDER — IOHEXOL 300 MG/ML  SOLN
100.0000 mL | Freq: Once | INTRAMUSCULAR | Status: AC | PRN
  Administered 2023-08-27: 100 mL via INTRAVENOUS

## 2023-08-27 MED ORDER — SMOG ENEMA
960.0000 mL | Freq: Once | RECTAL | Status: DC
  Filled 2023-08-27: qty 960

## 2023-08-27 MED ORDER — HYOSCYAMINE SULFATE 0.125 MG SL SUBL: 0.1250 mg | SUBLINGUAL_TABLET | SUBLINGUAL | Status: DC | PRN

## 2023-08-27 NOTE — Progress Notes (Signed)
 OT Cancellation Note  Patient Details Name: [name] MRN: [id number] DOB: 07-09-1945   Cancelled Treatment:    Reason Eval/Treat Not Completed: Patient at procedure or test/ unavailable. Pt off unit to CT. OT will continue to follow and re-attempt as able.    Kittie Krizan L. Mariusz Jubb, OTR/L  08/27/23, 12:25 PM

## 2023-08-27 NOTE — Plan of Care (Signed)
   Problem: Education: Goal: Ability to describe self-care measures that may prevent or decrease complications (Diabetes Survival Skills Education) will improve Outcome: Progressing Goal: Individualized Educational Video(s) Outcome: Progressing   Problem: Coping: Goal: Ability to adjust to condition or change in health will improve Outcome: Progressing

## 2023-08-27 NOTE — Progress Notes (Addendum)
 PROGRESS NOTE    [name]  ZOX:[id number]  DOB: 03/19/1946  DOA: 08/20/2023 PCP: Patient, [name] Outpatient Specialists:   Hospital course:  78 year old with history of renal stones status post bilateral stents 07/13/2023, DM2, HTN, HLD, CAD, paroxysmal A-fib, hypothyroidism, chronic pain, ataxia, GERD, hypothyroidism, chronic anemia comes to the ED for abnormal lab, hypotension and abdominal pain.  Upon admission CT renal stone study done, reviewed by urology showing good stent placement but recommended Foley catheter placement.  Currently started on empiric IV Rocephin .  PT/OT is recommending SNF.   Subjective:  Patient's PureWick was being changed by nurses and patient was yelling in discomfort, this stopped after PureWick was removed.  When asked why he was moaning patient denied being in pain once the PureWick was removed.  He admits he has not been taking his bowel regimen, he admits he is constipated.  Agrees to enema.  Objective: Vitals:   08/26/23 0809 08/26/23 1919 08/27/23 0541 08/27/23 1252  BP: (!) 148/63 123/62 139/65 (!) 140/65  Pulse:  86 81 78  Resp:  16 17   Temp:  97.8 F (36.6 C) 98.4 F (36.9 C) (!) 97.5 F (36.4 C)  TempSrc:  Oral Oral Oral  SpO2:  96% 99% 100%  Weight:      Height:        Intake/Output Summary (Last 24 hours) at 08/27/2023 1526 Last data filed at 08/27/2023 1525 Gross per 24 hour  Intake 720 ml  Output 1800 ml  Net -1080 ml   Filed Weights   08/21/23 0406 08/21/23 0900  Weight: 111.1 kg 120.5 kg     Exam:  General: Patient is awake and alert answers appropriately but perhaps hard of hearing Eyes: sclera anicteric, conjuctiva mild injection bilaterally CVS: S1-S2, regular  Respiratory:  decreased air entry bilaterally secondary to decreased inspiratory effort, rales at bases  GI: NABS, obese, soft and nontender LE: Warm and well-perfused  Data Reviewed:  Basic Metabolic Panel: Recent Labs  Lab 08/22/23 0257  08/23/23 0503 08/24/23 0425 08/25/23 0422 08/26/23 0448 08/27/23 0412  NA 132* 130* 131* 137 141 138  K 4.6 4.4 3.9 3.9 4.5 4.2  CL 103 103 103 109 115* 110  CO2 17* 17* 18* 22 18* 23  GLUCOSE 105* 229* 270* 267* 213* 217*  BUN 59* 72* 66* 55* 39* 27*  CREATININE 2.26* 2.06* 1.39* 0.91 0.72 0.72  CALCIUM  7.7* 7.6* 7.6* 7.9* 7.9* 7.8*  MG 1.4* 2.3 2.5* 2.2 1.8 1.8  PHOS 3.3  --   --   --   --   --     CBC: Recent Labs  Lab 08/20/23 2145 08/21/23 0429 08/23/23 0503 08/24/23 0425 08/25/23 0422 08/26/23 0448 08/27/23 0412  WBC 11.1*   < > 8.2 10.4 13.2* 18.2* 15.8*  NEUTROABS 10.0*  --   --   --   --   --   --   HGB 11.9*   < > 12.1* 13.5 12.1* 11.6* 11.0*  HCT 36.2*   < > 38.0* 42.5 38.3* 35.7* 33.5*  MCV 95.3   < > 95.5 96.2 96.0 94.4 95.2  PLT 89*   < > 66* 81* 113* 147* 195   < > = values in this interval not displayed.     Scheduled Meds:  amLODipine   5 mg Oral Daily   buPROPion   150 mg Oral Daily   carvedilol   6.25 mg Oral BID WC   Chlorhexidine  Gluconate Cloth  6 each  Topical Daily   clopidogrel   75 mg Oral Daily   docusate sodium   100 mg Oral BID   folic acid  1 mg Oral Daily   insulin  aspart  0-5 Units Subcutaneous QHS   insulin  aspart  0-6 Units Subcutaneous TID WC   insulin  aspart  3 Units Subcutaneous TID WC   insulin  glargine-yfgn  12 Units Subcutaneous Daily   levothyroxine   50 mcg Oral QAC breakfast   pantoprazole   40 mg Oral QAC breakfast   polyethylene glycol  17 g Oral Daily   sertraline   150 mg Oral Daily   sodium chloride  flush  3 mL Intravenous Q12H   sodium chloride  flush  3 mL Intravenous Q12H   tamsulosin  0.4 mg Oral QPM   Continuous Infusions:  cefTRIAXone  (ROCEPHIN )  IV 2 g (08/27/23 1015)     Assessment & Plan:   Rising leukocytosis Abnormal CT of abdomen and pelvis Complicated UTI secondary to E. Coli and Enterobacter S/p lithotripsy and ureteral stent 07/13/2023 Patient remains afebrile with no clear source of  infection Ureteral stent was removed today CT of abdomen and pelvis today reveals concern for 2 cm renal abscess and ongoing pyelonephritis.  Urology recommends IR drainage, IR consult placed.   Discussed with ID, no need to broaden antibiotic coverage at this time. Patient is on ceftriaxone  day #7  Metabolic encephalopathy Improved overnight, patient is much more awake and alert today To be multifactorial likely combination of polypharmacy and infection Follow clinically closely  DM 2 CBG in high 190s/low 200s Increase glargine to 16 from 12  AKI Resolved with treatment of infection and fluid resuscitation  PAF HTN Continue Coreg  and Norvasc  Not on anticoagulation as an outpatient for primary stroke prevention  CAD ASA and Plavix  were held in setting of hematuria and thrombocytopenia Platelets have rebounded to normal Will restart Plavix  today, can restart aspirin  if no further worsening of hematuria  GAD Continue Zoloft  and Wellbutrin   Disposition Will need to discharge to a to SNF per PT    DVT prophylaxis: SCD Code Status: Full code Family Communication: Spoke with patient's sister ms heilig     Studies: DG CHEST PORT 1 VIEW Result Date: 08/26/2023 CLINICAL DATA:  Cough EXAM: PORTABLE CHEST 1 VIEW COMPARISON:  Aug 20, 2023 FINDINGS: Subtle right lower lobe basilar hypoventilatory changes. No obvious infiltrates consolidations. No pleural effusions or reactions Heart and mediastinum normal Prior ACDF cervical spine. IMPRESSION: Subtle right lower lobe basilar hypoventilatory changes. Electronically Signed   By: Fredrich Jefferson M.D.   On: 08/26/2023 14:37    Principal Problem:   Severe sepsis (HCC) Active Problems:   Sepsis secondary to UTI (HCC)   Acute metabolic encephalopathy   Hyperkalemia   Hyponatremia   Thrombocytopenia (HCC)   Insulin  dependent type 2 diabetes mellitus (HCC)   Hyperlipidemia   Essential hypertension   Hypothyroidism    Nephrolithiasis   Paroxysmal atrial fibrillation (HCC)   History of CAD (coronary artery disease)   History of ataxia   Bilateral ureteral stent present   Hematuria     [name] Rollene Clink, [facility]  If 7PM-7AM, please contact night-coverage [email or web address]   LOS: 6 days

## 2023-08-27 NOTE — TOC Progression Note (Signed)
 Transition of Care [facility]) - Progression Note    Patient Details  Name: [name] MRN: [id number] Date of Birth: 03-Nov-1945  [facility]) CM/SW Contact  Kathryn Parish, RN Phone Number: 08/27/2023, 10:02 AM  Clinical Narrative:    Per Dr. Leann Prom discharge will be additional 2-3 days per secure chat.   Expected Discharge Plan: Long Term Nursing Home Barriers to Discharge: Continued Medical Work up  Expected Discharge Plan and Services   Discharge Planning Services: CM Consult   Living arrangements for the past 2 months: Skilled Nursing Facility ([facility])                                       Social Determinants of Health (SDOH) Interventions SDOH Screenings   Food Insecurity: No Food Insecurity (08/21/2023)  Housing: Low Risk  (08/21/2023)  Transportation Needs: No Transportation Needs (08/21/2023)  Utilities: Not At Risk (08/21/2023)  Social Connections: Patient Declined (08/25/2023)  Tobacco Use: High Risk (08/21/2023)    Readmission Risk Interventions     No data to display

## 2023-08-27 NOTE — TOC Progression Note (Signed)
 Transition of Care [facility]) - Progression Note    Patient Details  Name: [name] MRN: [id number] Date of Birth: 1945-09-19  [facility]) CM/SW Contact  Anslie Spadafora, Greggory Learn, RN Phone Number:  Clinical Narrative:    Patient not medically ready for discharge due to pain. NCM called patients sister [name] to give update.   Expected Discharge Plan: Long Term Nursing Home Barriers to Discharge: Continued Medical Work up  Expected Discharge Plan and Services   Discharge Planning Services: CM Consult   Living arrangements for the past 2 months: Skilled Nursing Facility ([facility])                                       Social Determinants of Health (SDOH) Interventions SDOH Screenings   Food Insecurity: No Food Insecurity (08/21/2023)  Housing: Low Risk  (08/21/2023)  Transportation Needs: No Transportation Needs (08/21/2023)  Utilities: Not At Risk (08/21/2023)  Social Connections: Patient Declined (08/25/2023)  Tobacco Use: High Risk (08/21/2023)    Readmission Risk Interventions     No data to display

## 2023-08-27 NOTE — Progress Notes (Signed)
   08/27/23 1217  PT Visit Information  Last PT Received On 08/25/23  Assistance Needed +2  Reason Eval/Treat Not Completed Patient at procedure or test/unavailable  History of Present Illness [name] is a 78 y/o male admitted from [facility] 08/20/23 with flank pain, severe sepsis 2* UTI and acute metabolic encephalopathy. Pt with recent admit to [facility] with kidney stones and s/p bil ureteral stent placement 07/13/23.  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   Will follow up, pt heading to CT on initial attempt.  Darien Eden, PT Acute Rehabilitation Services Office: [phone] 08/27/2023

## 2023-08-27 NOTE — Progress Notes (Signed)
 Physical Therapy Treatment Patient Details Name: [name] MRN: [id number] DOB: 1945/10/21 Today's Date: 08/27/2023   History of Present Illness [name] is a 78 y/o male admitted from [facility] 08/20/23 with flank pain, severe sepsis 2* UTI and acute metabolic encephalopathy. Pt with recent admit to [facility] with kidney stones and s/p bil ureteral stent placement 07/13/23.  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    PT Comments  Pt more alert today, awake in bed and agreeable to complete transfer. Pt requires min A t attain upright position in bed, once sitting is able to maintain balance sitting EOB. Pt requires mod A x2 to stand from EOB and once upright is able to complete step pivot to the chair with RW. Pt shows progress from previous session.     If plan is discharge home, recommend the following:     Can travel by private vehicle     No  Equipment Recommendations  None recommended by PT    Recommendations for Other Services       Precautions / Restrictions Precautions Precautions: Fall Recall of Precautions/Restrictions: Intact Restrictions Weight Bearing Restrictions Per Provider Order: No     Mobility  Bed Mobility Overal bed mobility: Needs Assistance Bed Mobility: Supine to Sit     Supine to sit: Min assist     General bed mobility comments: min A for trunk to attain upright position    Transfers Overall transfer level: Needs assistance Equipment used: Rolling walker (2 wheels) Transfers: Sit to/from Stand, Bed to chair/wheelchair/BSC Sit to Stand: Mod assist, +2 physical assistance   Step pivot transfers: Contact guard assist            Ambulation/Gait                   Stairs             Wheelchair Mobility     Tilt Bed    Modified Rankin (Stroke Patients Only)       Balance Overall balance  assessment: Needs assistance Sitting-balance support: Feet supported, No upper extremity supported Sitting balance-Leahy Scale: Fair     Standing balance support: Bilateral upper extremity supported, Reliant on assistive device for balance Standing balance-Leahy Scale: Poor                              Communication    Cognition Arousal: Alert Behavior During Therapy: Flat affect   PT - Cognitive impairments: No apparent impairments                                Cueing    Exercises      General Comments        Pertinent Vitals/Pain Pain Assessment Pain Assessment: No/denies pain Pain Intervention(s): Monitored during session    Home Living                          Prior Function            PT Goals (current goals can now be found in the care plan section) Acute Rehab PT Goals Patient Stated Goal: continue rehab to get strong enough to go home PT Goal Formulation: With patient Time For Goal Achievement: 09/05/23 Potential  to Achieve Goals: Fair Progress towards PT goals: Progressing toward goals    Frequency    Min 2X/week      PT Plan      Co-evaluation              AM-PAC PT "6 Clicks" Mobility   Outcome Measure  Help needed turning from your back to your side while in a flat bed without using bedrails?: A Little Help needed moving from lying on your back to sitting on the side of a flat bed without using bedrails?: A Lot Help needed moving to and from a bed to a chair (including a wheelchair)?: A Little Help needed standing up from a chair using your arms (e.g., wheelchair or bedside chair)?: A Lot Help needed to walk in hospital room?: Total Help needed climbing 3-5 steps with a railing? : Total 6 Click Score: 12    End of Session Equipment Utilized During Treatment: Gait belt Activity Tolerance: Patient limited by fatigue Patient left: with call bell/phone within reach;in chair;with chair alarm  set Nurse Communication: Mobility status PT Visit Diagnosis: Unsteadiness on feet (R26.81);Muscle weakness (generalized) (M62.81);Difficulty in walking, not elsewhere classified (R26.2);Pain     Time: 7829-5621 PT Time Calculation (min) (ACUTE ONLY): 11 min  Charges:    $Therapeutic Activity: 8-22 mins PT General Charges $$ ACUTE PT VISIT: 1 Visit                     Darien Eden, PT Acute Rehabilitation Services Office: [phone] 08/27/2023    [name] 08/27/2023, 3:25 PM

## 2023-08-27 NOTE — Plan of Care (Signed)
  Problem: Education: Goal: Ability to describe self-care measures that may prevent or decrease complications (Diabetes Survival Skills Education) will improve 08/27/2023 1806 by Brutus Caprice, RN Outcome: Progressing 08/27/2023 1805 by Brutus Caprice, RN Outcome: Progressing Goal: Individualized Educational Video(s) 08/27/2023 1806 by Brutus Caprice, RN Outcome: Progressing 08/27/2023 1805 by Brutus Caprice, RN Outcome: Progressing   Problem: Coping: Goal: Ability to adjust to condition or change in health will improve Outcome: Progressing

## 2023-08-27 NOTE — Plan of Care (Signed)

## 2023-08-27 NOTE — Progress Notes (Signed)
     Subjective: [name]. Adequate clear yellow UOP. Stents removed at bedside. Reviewed case and plan with pt and primary team.   Objective: Vital signs in last 24 hours: Temp:  [97.8 F (36.6 C)-98.4 F (36.9 C)] 98.4 F (36.9 C) (05/15 0541) Pulse Rate:  [81-86] 81 (05/15 0541) Resp:  [16-17] 17 (05/15 0541) BP: (123-139)/(62-65) 139/65 (05/15 0541) SpO2:  [96 %-99 %] 99 % (05/15 0541)  Assessment/Plan:  #sepsis #bilateral ureteral stents #AKI  Patient had interval spike in white blood cell count to 18.2k, now down to 15.8 today.  In discussion with primary team, infectious disease had expressed concern that the stents would be a nidus for infection and requested them removed.  These are coated to live in this environment without developing biofilms.  While not impossible, it would be highly unlikely that this had happened this quickly.  Regardless, I removed the stents at the bedside.   Significant constipation is another concern for his leukocytosis and he has been refusing his bowel regimen.  Proceeding with soapsuds enema today.  Short course of hyoscyamine  provided for spasm after stent removal.  Follow up in clinic. Please call with questions  Intake/Output from previous day: 05/14 0701 - 05/15 0700 In: 103 [I.V.:3; IV Piggyback:100] Out: 1450 [Urine:1450]  Intake/Output this shift: No intake/output data recorded.  Physical Exam:  General: Alert and oriented CV: No cyanosis Lungs: equal chest rise Gu: Bilateral tethered stents.  Foley catheter removed.  Lab Results: Recent Labs    08/25/23 0422 08/26/23 0448 08/27/23 0412  HGB 12.1* 11.6* 11.0*  HCT 38.3* 35.7* 33.5*   BMET Recent Labs    08/26/23 0448 08/27/23 0412  NA 141 138  K 4.5 4.2  CL 115* 110  CO2 18* 23  GLUCOSE 213* 217*  BUN 39* 27*  CREATININE 0.72 0.72  CALCIUM  7.9* 7.8*  HGB 11.6* 11.0*  WBC 18.2* 15.8*     Studies/Results: DG CHEST PORT 1 VIEW Result Date:  08/26/2023 CLINICAL DATA:  Cough EXAM: PORTABLE CHEST 1 VIEW COMPARISON:  Aug 20, 2023 FINDINGS: Subtle right lower lobe basilar hypoventilatory changes. No obvious infiltrates consolidations. No pleural effusions or reactions Heart and mediastinum normal Prior ACDF cervical spine. IMPRESSION: Subtle right lower lobe basilar hypoventilatory changes. Electronically Signed   By: Fredrich Jefferson M.D.   On: 08/26/2023 14:37      LOS: 6 days   Alla Ar, NP [facility] Pager: [phone]  08/27/2023, 9:26 AM

## 2023-08-28 DIAGNOSIS — R6520 Severe sepsis without septic shock: Secondary | ICD-10-CM | POA: Diagnosis not present

## 2023-08-28 DIAGNOSIS — A419 Sepsis, unspecified organism: Secondary | ICD-10-CM | POA: Diagnosis not present

## 2023-08-28 LAB — BASIC METABOLIC PANEL WITH GFR
Anion gap: 7 (ref 5–15)
BUN: 18 mg/dL (ref 8–23)
CO2: 20 mmol/L — ABNORMAL LOW (ref 22–32)
Calcium: 7.6 mg/dL — ABNORMAL LOW (ref 8.9–10.3)
Chloride: 105 mmol/L (ref 98–111)
Creatinine, Ser: 0.78 mg/dL (ref 0.61–1.24)
GFR, Estimated: >60 mL/min (ref >60)
Glucose, Bld: 169 mg/dL — ABNORMAL HIGH (ref 70–99)
Potassium: 4.1 mmol/L (ref 3.5–5.1)
Sodium: 132 mmol/L — ABNORMAL LOW (ref 135–145)

## 2023-08-28 LAB — CBC
HCT: 31.7 % — ABNORMAL LOW (ref 39.0–52.0)
Hemoglobin: 10.1 g/dL — ABNORMAL LOW (ref 13.0–17.0)
MCH: 31.1 pg (ref 26.0–34.0)
MCHC: 31.9 g/dL (ref 30.0–36.0)
MCV: 97.5 fL (ref 80.0–100.0)
Platelets: 224 10*3/uL (ref 150–400)
RBC: 3.25 MIL/uL — ABNORMAL LOW (ref 4.22–5.81)
RDW: 14.5 % (ref 11.5–15.5)
WBC: 13 10*3/uL — ABNORMAL HIGH (ref 4.0–10.5)
nRBC: 0 % (ref 0.0–0.2)

## 2023-08-28 LAB — GLUCOSE, CAPILLARY
Glucose-Capillary: 182 mg/dL — ABNORMAL HIGH (ref 70–99)
Glucose-Capillary: 163 mg/dL — ABNORMAL HIGH (ref 70–99)
Glucose-Capillary: 157 mg/dL — ABNORMAL HIGH (ref 70–99)
Glucose-Capillary: 185 mg/dL — ABNORMAL HIGH (ref 70–99)

## 2023-08-28 LAB — MAGNESIUM: Magnesium: 1.5 mg/dL — ABNORMAL LOW (ref 1.7–2.4)

## 2023-08-28 LAB — PROTIME-INR
INR: 1.1 (ref 0.8–1.2)
Prothrombin Time: 14.9 s (ref 11.4–15.2)

## 2023-08-28 MED ORDER — POLYETHYLENE GLYCOL 3350 17 G PO PACK: 17.0000 g | PACK | Freq: Every day | ORAL | Status: DC | PRN

## 2023-08-28 MED ORDER — SENNOSIDES-DOCUSATE SODIUM 8.6-50 MG PO TABS: 2.0000 | ORAL_TABLET | Freq: Every evening | ORAL | Status: DC | PRN

## 2023-08-28 NOTE — Plan of Care (Signed)

## 2023-08-28 NOTE — Progress Notes (Signed)
 PROGRESS NOTE    [name]  ZOX:[id number]  DOB: 1946-01-18  DOA: 08/20/2023 PCP: Patient, [name] Outpatient Specialists:   Hospital course:  78 year old with history of renal stones status post bilateral stents 07/13/2023, DM2, HTN, HLD, CAD, paroxysmal A-fib, hypothyroidism, chronic pain, ataxia, GERD, hypothyroidism, chronic anemia comes to the ED for abnormal lab, hypotension and abdominal pain.  Upon admission CT renal stone study done, reviewed by urology showing good stent placement but recommended Foley catheter placement.  Currently started on empiric IV Rocephin .  PT/OT is recommending SNF.   Subjective:    Objective: Vitals:   08/27/23 1252 08/27/23 1954 08/28/23 0446 08/28/23 0820  BP: (!) 140/65 (!) 142/64 (!) 155/69 (!) 152/71  Pulse: 78 74 79 77  Resp:  20 18   Temp: (!) 97.5 F (36.4 C) 99.5 F (37.5 C) 97.9 F (36.6 C)   TempSrc: Oral Oral Oral   SpO2: 100% 98% 97% 95%  Weight:      Height:        Intake/Output Summary (Last 24 hours) at 08/28/2023 1445 Last data filed at 08/28/2023 1129 Gross per 24 hour  Intake 413 ml  Output 2450 ml  Net -2037 ml   Filed Weights   08/21/23 0406 08/21/23 0900  Weight: 111.1 kg 120.5 kg     Exam:  General: Patient is awake and alert answers appropriately but perhaps hard of hearing Eyes: sclera anicteric, conjuctiva mild injection bilaterally CVS: S1-S2, regular  Respiratory:  decreased air entry bilaterally secondary to decreased inspiratory effort, rales at bases  GI: NABS, obese, soft and nontender LE: Warm and well-perfused  Data Reviewed:  Basic Metabolic Panel: Recent Labs  Lab 08/22/23 0257 08/23/23 0503 08/24/23 0425 08/25/23 0422 08/26/23 0448 08/27/23 0412 08/28/23 0417  NA 132*   < > 131* 137 141 138 132*  K 4.6   < > 3.9 3.9 4.5 4.2 4.1  CL 103   < > 103 109 115* 110 105  CO2 17*   < > 18* 22 18* 23 20*  GLUCOSE 105*   < > 270* 267* 213* 217* 169*  BUN 59*   < > 66* 55* 39*  27* 18  CREATININE 2.26*   < > 1.39* 0.91 0.72 0.72 0.78  CALCIUM  7.7*   < > 7.6* 7.9* 7.9* 7.8* 7.6*  MG 1.4*   < > 2.5* 2.2 1.8 1.8 1.5*  PHOS 3.3  --   --   --   --   --   --    < > = values in this interval not displayed.    CBC: Recent Labs  Lab 08/24/23 0425 08/25/23 0422 08/26/23 0448 08/27/23 0412 08/28/23 0536  WBC 10.4 13.2* 18.2* 15.8* 13.0*  HGB 13.5 12.1* 11.6* 11.0* 10.1*  HCT 42.5 38.3* 35.7* 33.5* 31.7*  MCV 96.2 96.0 94.4 95.2 97.5  PLT 81* 113* 147* 195 224     Scheduled Meds:  amLODipine   5 mg Oral Daily   buPROPion   150 mg Oral Daily   carvedilol   6.25 mg Oral BID WC   Chlorhexidine  Gluconate Cloth  6 each Topical Daily   docusate sodium   100 mg Oral BID   folic acid  1 mg Oral Daily   insulin  aspart  0-5 Units Subcutaneous QHS   insulin  aspart  0-6 Units Subcutaneous TID WC   insulin  aspart  3 Units Subcutaneous TID WC   insulin  glargine-yfgn  16 Units Subcutaneous Daily   levothyroxine   50 mcg Oral QAC breakfast   pantoprazole   40 mg Oral QAC breakfast   sertraline   150 mg Oral Daily   sodium chloride  flush  3 mL Intravenous Q12H   sodium chloride  flush  3 mL Intravenous Q12H   tamsulosin  0.4 mg Oral QPM   Continuous Infusions:  cefTRIAXone  (ROCEPHIN )  IV 2 g (08/28/23 1050)     Assessment & Plan:   Rising leukocytosis--2 cm renal abscess seen on CT Abnormal CT of abdomen and pelvis Complicated UTI secondary to E. Coli and Enterobacter S/p lithotripsy and ureteral stent 07/13/2023 CT abdomen and pelvis done yesterday for ongoing leukocytosis revealed 2 cm renal abscess and ongoing pyelonephritis.  IR was consulted for drainage, they note that it is possibly too small to aspirate and since patient has been on Plavix  he is at high risk for bleeding.  Recommendation is to repeat CT on Monday or Tuesday and consider aspiration after Plavix  has been off for 5 days.  Plavix  discontinued yesterday 5/15. Discussed with ID, no need to broaden  antibiotic coverage at this time. Patient is on ceftriaxone  day #8  Metabolic encephalopathy Progressive improvement in mental status with treatment of infection and discontinuation of polypharmacy, patient is more communicative today with apparently improved appetite, requesting pizza per his sister however she notes that he is not back to his baseline.  DM 2 CBG now under good control, continue glargine 16 units with SSI coverage  AKI Resolved with treatment of infection and fluid resuscitation  PAF HTN Continue Coreg  and Norvasc  Not on anticoagulation as an outpatient for primary stroke prevention  CAD ASA and Plavix  were held in setting of hematuria and thrombocytopenia Platelets have rebounded to normal Plavix  was restarted however discontinued yesterday 5/15 in anticipation of possible aspiration of renal abscess.  GAD Continue Zoloft  and Wellbutrin   Disposition [facility] and will [facility] when he is medically stable    DVT prophylaxis: SCD Code Status: Full code Family Communication: Spoke with patient's sister ms heilig     Studies: CT ABDOMEN PELVIS W CONTRAST Result Date: 08/27/2023 CLINICAL DATA:  Sepsis EXAM: CT ABDOMEN AND PELVIS WITH CONTRAST TECHNIQUE: Multidetector CT imaging of the abdomen and pelvis was performed using the standard protocol following bolus administration of intravenous contrast. RADIATION DOSE REDUCTION: This exam was performed according to the departmental dose-optimization program which includes automated exposure control, adjustment of the mA and/or kV according to patient size and/or use of iterative reconstruction technique. CONTRAST:  OMNIPAQUE  IOHEXOL  300 MG/ML  SOLN COMPARISON:  08/20/2023 FINDINGS: Lower chest: Small bilateral pleural effusions, left greater than right. Minimal dependent lower lobe atelectasis. Hepatobiliary: No focal liver abnormality is seen. Status post cholecystectomy. No  biliary dilatation. Pancreas: Unremarkable. No pancreatic ductal dilatation or surrounding inflammatory changes. Spleen: Normal in size without focal abnormality. Adrenals/Urinary Tract: Heterogeneous areas of decreased enhancement with striated appearance are seen within the posterior mid right kidney, as well as within the upper left kidney and posterior mid left kidney, consistent with bilateral pyelonephritis. There has been near complete resolution of the postprocedural gas within the renal collecting systems and along the serosal surface of the left kidney seen previously. Interval removal of the ureteral stents noted on prior exam. Adjacent to the punctate residual foci of gas along the cortex of the mid left kidney, there is a new 2.4 x 1.8 cm hypodense area identified reference image 40/2, concerning for developing renal abscess. No obstructive uropathy within either kidney.  No radiopaque urinary tract calculi. The adrenals and bladder are unremarkable. Stomach/Bowel: No bowel obstruction or ileus. Normal appendix right lower quadrant. No bowel wall thickening or inflammatory change. Vascular/Lymphatic: Aortic atherosclerosis. No enlarged abdominal or pelvic lymph nodes. Reproductive: Prostate is unremarkable. Other: No free intraperitoneal fluid or free gas. No abdominal wall hernia. Musculoskeletal: Prominent subcutaneous edema within the bilateral flanks and lower abdominal wall consistent with third spacing of fluid. No acute or destructive bony abnormalities. Reconstructed images demonstrate no additional findings. IMPRESSION: 1. Heterogeneous decreased cortical enhancement within the bilateral kidneys consistent with pyelonephritis. 2. 2.4 cm left renal hypodensity consistent with developing renal abscess, new since prior exam. 3. Interval removal of bilateral ureteral stents. No evidence of obstructive uropathy. 4. Prominent body wall edema, new since prior study. 5.  Aortic Atherosclerosis  (ICD10-I70.0). These results will be called to the ordering clinician or representative by the Radiologist Assistant, and communication documented in the PACS or [employer]. Electronically Signed   By: Bobbye Burrow M.D.   On: 08/27/2023 16:20    Principal Problem:   Severe sepsis (HCC) Active Problems:   Sepsis secondary to UTI (HCC)   Acute metabolic encephalopathy   Hyperkalemia   Hyponatremia   Thrombocytopenia (HCC)   Insulin  dependent type 2 diabetes mellitus (HCC)   Hyperlipidemia   Essential hypertension   Hypothyroidism   Nephrolithiasis   Paroxysmal atrial fibrillation (HCC)   History of CAD (coronary artery disease)   History of ataxia   Bilateral ureteral stent present   Hematuria     Tationna Fullard Rollene Clink, [facility]  If 7PM-7AM, please contact night-coverage [email or web address]   LOS: 7 days

## 2023-08-28 NOTE — Progress Notes (Signed)
 Patient ID: [name], male   DOB: [birth year], 78 y.o.   MRN: [id number] Request received for image guided aspiration/drainage of left renal abscess in pt. Latest images were reviewed by Dr. Marlena Sima. The area in question is too small for aspiration/drainage and very high risk for bleeding especially with pt on Plavix . Would recommend continued abx and repeat imaging early next week to reassess.

## 2023-08-29 DIAGNOSIS — R6520 Severe sepsis without septic shock: Secondary | ICD-10-CM | POA: Diagnosis not present

## 2023-08-29 DIAGNOSIS — A419 Sepsis, unspecified organism: Secondary | ICD-10-CM | POA: Diagnosis not present

## 2023-08-29 LAB — BASIC METABOLIC PANEL WITH GFR
Anion gap: 4 — ABNORMAL LOW (ref 5–15)
BUN: 14 mg/dL (ref 8–23)
CO2: 26 mmol/L (ref 22–32)
Calcium: 8.1 mg/dL — ABNORMAL LOW (ref 8.9–10.3)
Chloride: 104 mmol/L (ref 98–111)
Creatinine, Ser: 0.69 mg/dL (ref 0.61–1.24)
GFR, Estimated: >60 mL/min (ref >60)
Glucose, Bld: 165 mg/dL — ABNORMAL HIGH (ref 70–99)
Potassium: 4.5 mmol/L (ref 3.5–5.1)
Sodium: 134 mmol/L — ABNORMAL LOW (ref 135–145)

## 2023-08-29 LAB — CBC WITH DIFFERENTIAL/PLATELET
Abs Immature Granulocytes: 0.16 10*3/uL — ABNORMAL HIGH (ref 0.00–0.07)
Basophils Absolute: 0 10*3/uL (ref 0.0–0.1)
Basophils Relative: 0 %
Eosinophils Absolute: 0.2 10*3/uL (ref 0.0–0.5)
Eosinophils Relative: 2 %
HCT: 30.3 % — ABNORMAL LOW (ref 39.0–52.0)
Hemoglobin: 9.6 g/dL — ABNORMAL LOW (ref 13.0–17.0)
Immature Granulocytes: 1 %
Lymphocytes Relative: 13 %
Lymphs Abs: 1.4 10*3/uL (ref 0.7–4.0)
MCH: 30.7 pg (ref 26.0–34.0)
MCHC: 31.7 g/dL (ref 30.0–36.0)
MCV: 96.8 fL (ref 80.0–100.0)
Monocytes Absolute: 0.7 10*3/uL (ref 0.1–1.0)
Monocytes Relative: 6 %
Neutro Abs: 8.6 10*3/uL — ABNORMAL HIGH (ref 1.7–7.7)
Neutrophils Relative %: 78 %
Platelets: 263 10*3/uL (ref 150–400)
RBC: 3.13 MIL/uL — ABNORMAL LOW (ref 4.22–5.81)
RDW: 14.4 % (ref 11.5–15.5)
WBC: 11 10*3/uL — ABNORMAL HIGH (ref 4.0–10.5)
nRBC: 0 % (ref 0.0–0.2)

## 2023-08-29 LAB — GLUCOSE, CAPILLARY
Glucose-Capillary: 169 mg/dL — ABNORMAL HIGH (ref 70–99)
Glucose-Capillary: 158 mg/dL — ABNORMAL HIGH (ref 70–99)
Glucose-Capillary: 153 mg/dL — ABNORMAL HIGH (ref 70–99)
Glucose-Capillary: 133 mg/dL — ABNORMAL HIGH (ref 70–99)

## 2023-08-29 NOTE — Plan of Care (Signed)

## 2023-08-29 NOTE — Progress Notes (Signed)
 PROGRESS NOTE    [name]  MVH:[id number]  DOB: July 02, 1945  DOA: 08/20/2023 PCP: Patient, [name] Outpatient Specialists:   Hospital course:  Prior documentation: "78 year old with history of renal stones status post bilateral stents 07/13/2023, DM2, HTN, HLD, CAD, paroxysmal A-fib, hypothyroidism, chronic pain, ataxia, GERD, hypothyroidism, chronic anemia comes to the ED for abnormal lab, hypotension and abdominal pain.  Upon admission CT renal stone study done, reviewed by urology showing good stent placement but recommended Foley catheter placement.  Currently started on empiric IV Rocephin .  PT/OT is recommending SNF".  08/29/2023: Patient seen.  No new complaints.   Subjective:  No complaints.  Objective: Vitals:   08/28/23 1913 08/29/23 0415 08/29/23 1002 08/29/23 1134  BP: 127/69 (!) 152/71 (!) 142/67 (!) 157/73  Pulse: 78 72  73  Resp: 17 18  17   Temp:  98.6 F (37 C)  99.1 F (37.3 C)  TempSrc:    Oral  SpO2: 99% 96%  98%  Weight:      Height:        Intake/Output Summary (Last 24 hours) at 08/29/2023 1312 Last data filed at 08/29/2023 1143 Gross per 24 hour  Intake 3 ml  Output 2200 ml  Net -2197 ml   Filed Weights   08/21/23 0406 08/21/23 0900  Weight: 111.1 kg 120.5 kg     Exam:  General: Patient is obese.  Not in any distress.   Eyes: Patient is pale.  CVS: S1-S2, regular  Respiratory: Clear to auscultation.   GI: Obese and nontender.  LE: Mild ankle edema.  Data Reviewed:  Basic Metabolic Panel: Recent Labs  Lab 08/24/23 0425 08/25/23 0422 08/26/23 0448 08/27/23 0412 08/28/23 0417 08/29/23 0505  NA 131* 137 141 138 132* 134*  K 3.9 3.9 4.5 4.2 4.1 4.5  CL 103 109 115* 110 105 104  CO2 18* 22 18* 23 20* 26  GLUCOSE 270* 267* 213* 217* 169* 165*  BUN 66* 55* 39* 27* 18 14  CREATININE 1.39* 0.91 0.72 0.72 0.78 0.69  CALCIUM  7.6* 7.9* 7.9* 7.8* 7.6* 8.1*  MG 2.5* 2.2 1.8 1.8 1.5*  --     CBC: Recent Labs  Lab  08/25/23 0422 08/26/23 0448 08/27/23 0412 08/28/23 0536 08/29/23 0505  WBC 13.2* 18.2* 15.8* 13.0* 11.0*  NEUTROABS  --   --   --   --  8.6*  HGB 12.1* 11.6* 11.0* 10.1* 9.6*  HCT 38.3* 35.7* 33.5* 31.7* 30.3*  MCV 96.0 94.4 95.2 97.5 96.8  PLT 113* 147* 195 224 263     Scheduled Meds:  amLODipine   5 mg Oral Daily   buPROPion   150 mg Oral Daily   carvedilol   6.25 mg Oral BID WC   Chlorhexidine  Gluconate Cloth  6 each Topical Daily   docusate sodium   100 mg Oral BID   folic acid   1 mg Oral Daily   insulin  aspart  0-5 Units Subcutaneous QHS   insulin  aspart  0-6 Units Subcutaneous TID WC   insulin  aspart  3 Units Subcutaneous TID WC   insulin  glargine-yfgn  16 Units Subcutaneous Daily   levothyroxine   50 mcg Oral QAC breakfast   pantoprazole   40 mg Oral QAC breakfast   sertraline   150 mg Oral Daily   sodium chloride  flush  3 mL Intravenous Q12H   sodium chloride  flush  3 mL Intravenous Q12H   tamsulosin   0.4 mg Oral QPM   Continuous Infusions:  cefTRIAXone  (ROCEPHIN )  IV 2 g (  08/29/23 1000)     Assessment & Plan:   Rising leukocytosis--2 cm renal abscess seen on CT Abnormal CT of abdomen and pelvis Complicated UTI secondary to E. Coli and Enterobacter S/p lithotripsy and ureteral stent 07/13/2023 -CT abdomen and pelvis done for ongoing leukocytosis revealed 2 cm renal abscess and ongoing pyelonephritis.  IR was consulted for drainage, they note that it is possibly too small to aspirate and since patient has been on Plavix  he is at high risk for bleeding.  -Recommendation is to repeat CT on Monday or Tuesday and consider aspiration after -Plavix  has been off for 5 days.  Plavix  discontinued yesterday 5/15. -Continue IV Rocephin . - Improvement in leukocytosis noted.  WBC has gone from 13-11.  Metabolic encephalopathy -Patient remains reasonably communicative.    DM 2 -Controlled.    AKI Resolved.  PAF HTN Continue Coreg  and Norvasc  Not on anticoagulation as an  outpatient for primary stroke prevention  CAD ASA and Plavix  were held in setting of hematuria and thrombocytopenia Platelets have rebounded to normal Plavix  was restarted however discontinued yesterday 5/15 in anticipation of possible aspiration of renal abscess.  GAD Continue Zoloft  and Wellbutrin   Disposition [facility] and will be [facility] back [facility] when he is medically stable    DVT prophylaxis: SCD Code Status: Full code Family Communication:    Studies: No results found.   Principal Problem:   Severe sepsis (HCC) Active Problems:   Hyperlipidemia   Essential hypertension   Hypothyroidism   Insulin  dependent type 2 diabetes mellitus (HCC)   Nephrolithiasis   Sepsis secondary to UTI (HCC)   Acute metabolic encephalopathy   Hyperkalemia   Hyponatremia   Paroxysmal atrial fibrillation (HCC)   History of CAD (coronary artery disease)   History of ataxia   Thrombocytopenia (HCC)   Bilateral ureteral stent present   Hematuria     Doroteo Gasmen, [facility]  If 7PM-7AM, please contact night-coverage [email or web address]   LOS: 8 days

## 2023-08-29 NOTE — Progress Notes (Signed)
 Subjective: NAEON. Patient reports he feels well this morning. Creatinine looks good, WBC trending better.   Objective: Vital signs in last 24 hours: Temp:  [98.6 F (37 C)] 98.6 F (37 C) (05/17 0415) Pulse Rate:  [72-78] 72 (05/17 0415) Resp:  [17-18] 18 (05/17 0415) BP: (127-152)/(67-71) 142/67 (05/17 1002) SpO2:  [96 %-99 %] 96 % (05/17 0415)  Assessment/Plan:  #sepsis #bilateral ureteral stents #AKI  Patient feels well today.  Repeat imaging planned Monday or Tuesday to reassess possible L renal abscess most recent CT scan shows some residual stone debris in right collecting system. This should pass, but discussed with patient warning signs/symptoms of obstructing stone for which he should notify our office  Follow up in clinic. Please call with questions  Intake/Output from previous day: 05/16 0701 - 05/17 0700 In: 3 [I.V.:3] Out: 2050 [Urine:2050]  Intake/Output this shift: No intake/output data recorded.  Physical Exam:  General: Alert and oriented CV: No cyanosis Lungs: equal chest rise   Lab Results: Recent Labs    08/27/23 0412 08/28/23 0536 08/29/23 0505  HGB 11.0* 10.1* 9.6*  HCT 33.5* 31.7* 30.3*   BMET Recent Labs    08/28/23 0417 08/28/23 0536 08/29/23 0505  NA 132*  --  134*  K 4.1  --  4.5  CL 105  --  104  CO2 20*  --  26  GLUCOSE 169*  --  165*  BUN 18  --  14  CREATININE 0.78  --  0.69  CALCIUM  7.6*  --  8.1*  HGB  --  10.1* 9.6*  WBC  --  13.0* 11.0*     Studies/Results: CT ABDOMEN PELVIS W CONTRAST Result Date: 08/27/2023 CLINICAL DATA:  Sepsis EXAM: CT ABDOMEN AND PELVIS WITH CONTRAST TECHNIQUE: Multidetector CT imaging of the abdomen and pelvis was performed using the standard protocol following bolus administration of intravenous contrast. RADIATION DOSE REDUCTION: This exam was performed according to the departmental dose-optimization program which includes automated exposure control, adjustment of the mA and/or kV  according to patient size and/or use of iterative reconstruction technique. CONTRAST:  OMNIPAQUE  IOHEXOL  300 MG/ML  SOLN COMPARISON:  08/20/2023 FINDINGS: Lower chest: Small bilateral pleural effusions, left greater than right. Minimal dependent lower lobe atelectasis. Hepatobiliary: No focal liver abnormality is seen. Status post cholecystectomy. No biliary dilatation. Pancreas: Unremarkable. No pancreatic ductal dilatation or surrounding inflammatory changes. Spleen: Normal in size without focal abnormality. Adrenals/Urinary Tract: Heterogeneous areas of decreased enhancement with striated appearance are seen within the posterior mid right kidney, as well as within the upper left kidney and posterior mid left kidney, consistent with bilateral pyelonephritis. There has been near complete resolution of the postprocedural gas within the renal collecting systems and along the serosal surface of the left kidney seen previously. Interval removal of the ureteral stents noted on prior exam. Adjacent to the punctate residual foci of gas along the cortex of the mid left kidney, there is a new 2.4 x 1.8 cm hypodense area identified reference image 40/2, concerning for developing renal abscess. No obstructive uropathy within either kidney. No radiopaque urinary tract calculi. The adrenals and bladder are unremarkable. Stomach/Bowel: No bowel obstruction or ileus. Normal appendix right lower quadrant. No bowel wall thickening or inflammatory change. Vascular/Lymphatic: Aortic atherosclerosis. No enlarged abdominal or pelvic lymph nodes. Reproductive: Prostate is unremarkable. Other: No free intraperitoneal fluid or free gas. No abdominal wall hernia. Musculoskeletal: Prominent subcutaneous edema within the bilateral flanks and lower abdominal wall consistent with  third spacing of fluid. No acute or destructive bony abnormalities. Reconstructed images demonstrate no additional findings. IMPRESSION: 1. Heterogeneous  decreased cortical enhancement within the bilateral kidneys consistent with pyelonephritis. 2. 2.4 cm left renal hypodensity consistent with developing renal abscess, new since prior exam. 3. Interval removal of bilateral ureteral stents. No evidence of obstructive uropathy. 4. Prominent body wall edema, new since prior study. 5.  Aortic Atherosclerosis (ICD10-I70.0). These results will be called to the ordering clinician or representative by the Radiologist Assistant, and communication documented in the PACS or [employer]. Electronically Signed   By: Bobbye Burrow M.D.   On: 08/27/2023 16:20      LOS: 8 days   Mallie Seal MD   08/29/2023, 10:40 AM

## 2023-08-30 DIAGNOSIS — A419 Sepsis, unspecified organism: Secondary | ICD-10-CM | POA: Diagnosis not present

## 2023-08-30 DIAGNOSIS — R6520 Severe sepsis without septic shock: Secondary | ICD-10-CM | POA: Diagnosis not present

## 2023-08-30 LAB — GLUCOSE, CAPILLARY
Glucose-Capillary: 151 mg/dL — ABNORMAL HIGH (ref 70–99)
Glucose-Capillary: 127 mg/dL — ABNORMAL HIGH (ref 70–99)
Glucose-Capillary: 169 mg/dL — ABNORMAL HIGH (ref 70–99)
Glucose-Capillary: 167 mg/dL — ABNORMAL HIGH (ref 70–99)

## 2023-08-30 NOTE — Progress Notes (Signed)
 PROGRESS NOTE    [name]  ZOX:[id number]  DOB: [birth year]  DOA: 08/20/2023 PCP: Patient, [name] Outpatient Specialists:   Hospital course:  Prior documentation: "78 year old with history of renal stones status post bilateral stents 07/13/2023, DM2, HTN, HLD, CAD, paroxysmal A-fib, hypothyroidism, chronic pain, ataxia, GERD, hypothyroidism, chronic anemia comes to the ED for abnormal lab, hypotension and abdominal pain.  Upon admission CT renal stone study done, reviewed by urology showing good stent placement but recommended Foley catheter placement.  Currently started on empiric IV Rocephin .  PT/OT is recommending SNF".  08/29/2023: Patient seen.  No new complaints. 08/30/2023: Patient seen.  No new complaints.  Repeat CT scan abdomen/pelvis as planned in the next 24 to 48 hours.   Subjective:  No complaints.  Objective: Vitals:   08/29/23 1647 08/29/23 1918 08/30/23 0409 08/30/23 1227  BP: (!) 149/65 (!) 152/72 (!) 147/67 (!) 150/77  Pulse: 72 79 74 73  Resp:  18 18 18   Temp:  98.7 F (37.1 C) 98.3 F (36.8 C) 98.7 F (37.1 C)  TempSrc:  Oral Oral Oral  SpO2:  97% 98% 100%  Weight:      Height:        Intake/Output Summary (Last 24 hours) at 08/30/2023 1618 Last data filed at 08/30/2023 0854 Gross per 24 hour  Intake 100 ml  Output 3000 ml  Net -2900 ml   Filed Weights   08/21/23 0406 08/21/23 0900  Weight: 111.1 kg 120.5 kg     Exam:  General: Patient is obese.  Not in any distress.   Eyes: Patient is pale.  CVS: S1-S2, regular  Respiratory: Clear to auscultation.   GI: Obese and nontender.  LE: Mild ankle edema.  Data Reviewed:  Basic Metabolic Panel: Recent Labs  Lab 08/24/23 0425 08/25/23 0422 08/26/23 0448 08/27/23 0412 08/28/23 0417 08/29/23 0505  NA 131* 137 141 138 132* 134*  K 3.9 3.9 4.5 4.2 4.1 4.5  CL 103 109 115* 110 105 104  CO2 18* 22 18* 23 20* 26  GLUCOSE 270* 267* 213* 217* 169* 165*  BUN 66* 55* 39* 27* 18 14   CREATININE 1.39* 0.91 0.72 0.72 0.78 0.69  CALCIUM  7.6* 7.9* 7.9* 7.8* 7.6* 8.1*  MG 2.5* 2.2 1.8 1.8 1.5*  --     CBC: Recent Labs  Lab 08/25/23 0422 08/26/23 0448 08/27/23 0412 08/28/23 0536 08/29/23 0505  WBC 13.2* 18.2* 15.8* 13.0* 11.0*  NEUTROABS  --   --   --   --  8.6*  HGB 12.1* 11.6* 11.0* 10.1* 9.6*  HCT 38.3* 35.7* 33.5* 31.7* 30.3*  MCV 96.0 94.4 95.2 97.5 96.8  PLT 113* 147* 195 224 263     Scheduled Meds:  amLODipine   5 mg Oral Daily   buPROPion   150 mg Oral Daily   carvedilol   6.25 mg Oral BID WC   Chlorhexidine  Gluconate Cloth  6 each Topical Daily   docusate sodium   100 mg Oral BID   folic acid   1 mg Oral Daily   insulin  aspart  0-5 Units Subcutaneous QHS   insulin  aspart  0-6 Units Subcutaneous TID WC   insulin  aspart  3 Units Subcutaneous TID WC   insulin  glargine-yfgn  16 Units Subcutaneous Daily   levothyroxine   50 mcg Oral QAC breakfast   pantoprazole   40 mg Oral QAC breakfast   sertraline   150 mg Oral Daily   sodium chloride  flush  3 mL Intravenous Q12H   sodium chloride   flush  3 mL Intravenous Q12H   tamsulosin   0.4 mg Oral QPM   Continuous Infusions:  cefTRIAXone  (ROCEPHIN )  IV 2 g (08/30/23 1014)     Assessment & Plan:   Rising leukocytosis--2 cm renal abscess seen on CT Abnormal CT of abdomen and pelvis Complicated UTI secondary to E. Coli and Enterobacter S/p lithotripsy and ureteral stent 07/13/2023 -CT abdomen and pelvis done for ongoing leukocytosis revealed 2 cm renal abscess and ongoing pyelonephritis.  IR was consulted for drainage, they note that it is possibly too small to aspirate and since patient has been on Plavix  he is at high risk for bleeding.  -Recommendation is to repeat CT on Monday or Tuesday and consider aspiration after -Plavix  has been off for 5 days.  Plavix  discontinued yesterday 5/15. -Continue IV Rocephin . - Improvement in leukocytosis noted.  WBC has gone from 13-11. 08/27/2023: Repeat CT scan  abdomen/pelvis.  Renal panel, CBC and magnesium  in the morning.  Metabolic encephalopathy -Patient remains reasonably communicative.    DM 2 -Controlled.    AKI Resolved.  PAF HTN Continue Coreg  and Norvasc  Not on anticoagulation as an outpatient for primary stroke prevention  CAD ASA and Plavix  were held in setting of hematuria and thrombocytopenia Platelets have rebounded to normal Plavix  was restarted however discontinued yesterday 5/15 in anticipation of possible aspiration of renal abscess.  GAD Continue Zoloft  and Wellbutrin   [facility] and [facility] when he is medically stable    DVT prophylaxis: SCD Code Status: Full code Family Communication:    Studies: No results found.   Principal Problem:   Severe sepsis (HCC) Active Problems:   Hyperlipidemia   Essential hypertension   Hypothyroidism   Insulin  dependent type 2 diabetes mellitus (HCC)   Nephrolithiasis   Sepsis secondary to UTI (HCC)   Acute metabolic encephalopathy   Hyperkalemia   Hyponatremia   Paroxysmal atrial fibrillation (HCC)   History of CAD (coronary artery disease)   History of ataxia   Thrombocytopenia (HCC)   Bilateral ureteral stent present   Hematuria     Doroteo Gasmen, [facility]  If 7PM-7AM, please contact night-coverage [email or web address]   LOS: 9 days

## 2023-08-30 NOTE — Plan of Care (Signed)

## 2023-08-31 DIAGNOSIS — A419 Sepsis, unspecified organism: Secondary | ICD-10-CM | POA: Diagnosis not present

## 2023-08-31 DIAGNOSIS — R6520 Severe sepsis without septic shock: Secondary | ICD-10-CM | POA: Diagnosis not present

## 2023-08-31 LAB — CBC WITH DIFFERENTIAL/PLATELET
Abs Immature Granulocytes: 0.09 10*3/uL — ABNORMAL HIGH (ref 0.00–0.07)
Basophils Absolute: 0 10*3/uL (ref 0.0–0.1)
Basophils Relative: 0 %
Eosinophils Absolute: 0.1 10*3/uL (ref 0.0–0.5)
Eosinophils Relative: 2 %
HCT: 30.9 % — ABNORMAL LOW (ref 39.0–52.0)
Hemoglobin: 9.7 g/dL — ABNORMAL LOW (ref 13.0–17.0)
Immature Granulocytes: 1 %
Lymphocytes Relative: 13 %
Lymphs Abs: 1.1 10*3/uL (ref 0.7–4.0)
MCH: 30.1 pg (ref 26.0–34.0)
MCHC: 31.4 g/dL (ref 30.0–36.0)
MCV: 96 fL (ref 80.0–100.0)
Monocytes Absolute: 0.7 10*3/uL (ref 0.1–1.0)
Monocytes Relative: 8 %
Neutro Abs: 6.4 10*3/uL (ref 1.7–7.7)
Neutrophils Relative %: 76 %
Platelets: 343 10*3/uL (ref 150–400)
RBC: 3.22 MIL/uL — ABNORMAL LOW (ref 4.22–5.81)
RDW: 13.7 % (ref 11.5–15.5)
WBC: 8.4 10*3/uL (ref 4.0–10.5)
nRBC: 0 % (ref 0.0–0.2)

## 2023-08-31 LAB — RENAL FUNCTION PANEL
Albumin: 2.1 g/dL — ABNORMAL LOW (ref 3.5–5.0)
Anion gap: 8 (ref 5–15)
BUN: 14 mg/dL (ref 8–23)
CO2: 27 mmol/L (ref 22–32)
Calcium: 8.4 mg/dL — ABNORMAL LOW (ref 8.9–10.3)
Chloride: 100 mmol/L (ref 98–111)
Creatinine, Ser: 0.75 mg/dL (ref 0.61–1.24)
GFR, Estimated: >60 mL/min (ref >60)
Glucose, Bld: 194 mg/dL — ABNORMAL HIGH (ref 70–99)
Phosphorus: 3.2 mg/dL (ref 2.5–4.6)
Potassium: 4.4 mmol/L (ref 3.5–5.1)
Sodium: 135 mmol/L (ref 135–145)

## 2023-08-31 LAB — CULTURE, BLOOD (ROUTINE X 2)
Culture: NO GROWTH
Culture: NO GROWTH
Special Requests: ADEQUATE

## 2023-08-31 LAB — GLUCOSE, CAPILLARY
Glucose-Capillary: 179 mg/dL — ABNORMAL HIGH (ref 70–99)
Glucose-Capillary: 197 mg/dL — ABNORMAL HIGH (ref 70–99)
Glucose-Capillary: 149 mg/dL — ABNORMAL HIGH (ref 70–99)
Glucose-Capillary: 120 mg/dL — ABNORMAL HIGH (ref 70–99)

## 2023-08-31 LAB — MAGNESIUM: Magnesium: 1.5 mg/dL — ABNORMAL LOW (ref 1.7–2.4)

## 2023-08-31 MED ORDER — MAGNESIUM SULFATE 2 GM/50ML IV SOLN
2.0000 g | Freq: Once | INTRAVENOUS | Status: AC
  Administered 2023-08-31: 2 g via INTRAVENOUS
  Filled 2023-08-31: qty 50

## 2023-08-31 NOTE — TOC Progression Note (Addendum)
 Transition of Care [facility]) - Progression Note    Patient Details  Name: [name] MRN: [id number] Date of Birth: 08/25/1945  [facility] [place] CM/SW Contact  Kathryn Parish, RN Phone Number: 08/31/2023, 8:51 AM  Clinical Narrative:    NCM text [name] w/ [facility] to ask if he will be able to return. [name] said yes. NCM secure chat Dr. Sandria Cruise asking if pt is medically ready for discharge.   Expected Discharge Plan: Long Term Nursing Home Barriers to Discharge: Continued Medical Work up  Expected Discharge Plan and Services   Discharge Planning Services: CM Consult   Living arrangements for the past 2 months: Skilled Nursing Facility ([facility] [place])                                       Social Determinants of Health (SDOH) Interventions SDOH Screenings   Food Insecurity: No Food Insecurity (08/21/2023)  Housing: Low Risk  (08/21/2023)  Transportation Needs: No Transportation Needs (08/21/2023)  Utilities: Not At Risk (08/21/2023)  Social Connections: Patient Declined (08/25/2023)  Tobacco Use: High Risk (08/21/2023)    Readmission Risk Interventions     No data to display

## 2023-08-31 NOTE — Progress Notes (Signed)
 Occupational Therapy Treatment Patient Details Name: [name] MRN: [id number] DOB: Aug 25, 1945 Today's Date: 08/31/2023   History of present illness [name] is a 78 y/o male admitted from [facility] 08/20/23 with flank pain, severe sepsis 2* UTI and acute metabolic encephalopathy. Pt with recent admit to [facility] with kidney stones and s/p bil ureteral stent placement 07/13/23.  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   OT comments  Patient seen this am for skilled OT session. Significant improvement noted for task iniotiation, functional STS and tolerance for EOB full ADL's (see below for current levels). Patient able to SPT and side step with RW however declined recliner due to fear of not being able to transfer out. OT offered to build up surface, patient still declined. Denies pain and no SOB this session. OT will continue to progress in Acute setting and recommending OT services once return to long term care facility.       If plan is discharge home, recommend the following:  A lot of help with walking and/or transfers;A lot of help with bathing/dressing/bathroom;Assistance with cooking/housework;Assist for transportation;Help with stairs or ramp for entrance   Equipment Recommendations  BSC/3in1       Precautions / Restrictions Precautions Precautions: Fall Recall of Precautions/Restrictions: Intact Restrictions Weight Bearing Restrictions Per Provider Order: No       Mobility Bed Mobility Overal bed mobility: Needs Assistance Bed Mobility: Sit to Supine, Supine to Sit Rolling: Min assist Sidelying to sit: Min assist Supine to sit: Min assist Sit to supine: Min assist        Transfers Overall transfer level: Needs assistance Equipment used: Rolling walker (2 wheels) Transfers: Sit to/from Stand Sit to Stand: Min assist, From elevated surface,  +2 physical assistance Stand pivot transfers: Min assist, +2 safety/equipment, +2 physical assistance, From elevated surface   Step pivot transfers: Min assist     General transfer comment: able to step around with RW to and from recliner, deferred to sit due to fear of difficulty coming out oif recliner despite OT offering build up with multiple pillows     Balance Overall balance assessment: Needs assistance Sitting-balance support: Feet supported, No upper extremity supported Sitting balance-[name] Scale: Fair     Standing balance support: During functional activity, Reliant on assistive device for balance, Bilateral upper extremity supported Standing balance-[name] Scale: Poor                             ADL either performed or assessed with clinical judgement   ADL Overall ADL's : Needs assistance/impaired Eating/Feeding: Set up;Sitting   Grooming: Wash/dry hands;Wash/dry face;Oral care;Contact guard assist;Sitting (EOB)   Upper Body Bathing: Contact guard assist;Sitting   Lower Body Bathing: Moderate assistance;Sitting/lateral leans;Sit to/from stand   Upper Body Dressing : Minimal assistance;Sitting   Lower Body Dressing: Cueing for sequencing;Sitting/lateral leans;Sit to/from stand Lower Body Dressing Details (indicate cue type and reason): unable to reach to feet but improved functional reach to lower legs with figure 4 trials             Functional mobility during ADLs: Minimal assistance;Rolling walker (2 wheels) (STS and side stepping x 4 with significant increased functionality and tolerance in standing) General ADL Comments: improved STS for LB self care    Extremity/Trunk Assessment Upper Extremity Assessment Upper Extremity Assessment: Generalized weakness  Lower Extremity Assessment Lower Extremity Assessment: Generalized weakness                 Communication Communication Communication: Impaired Factors Affecting Communication:  Hearing impaired   Cognition Arousal: Alert Behavior During Therapy: Flat affect Cognition: No apparent impairments                               Following commands: Impaired Following commands impaired: Follows one step commands with increased time      Cueing   Cueing Techniques: Verbal cues, Gestural cues, Tactile cues        General Comments no skin issues noted this day, no SOB    Pertinent Vitals/ Pain       Pain Assessment Pain Assessment: No/denies pain  \ Frequency  Min 1X/week        Progress Toward Goals  OT Goals(current goals can now be found in the care plan section)  Progress towards OT goals: Progressing toward goals  Acute Rehab OT Goals Patient Stated Goal: to be able to go back to [facility] soon OT Goal Formulation: With patient Time For Goal Achievement: 09/05/23 Potential to Achieve Goals: Good ADL Goals Pt Will Perform Lower Body Bathing: with min assist;sit to/from stand Pt Will Transfer to Toilet: with min assist;ambulating;bedside commode Pt Will Perform Toileting - Clothing Manipulation and hygiene: with min assist;sit to/from stand  Plan         AM-PAC OT "6 Clicks" Daily Activity     Outcome Measure   Help from another person eating meals?: None Help from another person taking care of personal grooming?: A Little Help from another person toileting, which includes using toliet, bedpan, or urinal?: A Lot Help from another person bathing (including washing, rinsing, drying)?: A Lot Help from another person to put on and taking off regular upper body clothing?: A Little Help from another person to put on and taking off regular lower body clothing?: A Lot 6 Click Score: 16    End of Session Equipment Utilized During Treatment: Gait belt;Rolling walker (2 wheels)  OT Visit Diagnosis: Unsteadiness on feet (R26.81);Other abnormalities of gait and mobility (R26.89);Muscle weakness (generalized) (M62.81)   Activity Tolerance  Patient tolerated treatment well   Patient Left in bed;with call bell/phone within reach;with bed alarm set   Nurse Communication Mobility status;Other (comment)        Time: 5621-3086 OT Time Calculation (min): 27 min  Charges: OT General Charges $OT Visit: 1 Visit OT Treatments $Self Care/Home Management : 23-37 mins  Jazzmon Prindle OT/L Acute Rehabilitation Department  [phone]  08/31/2023, 10:06 AM

## 2023-08-31 NOTE — Progress Notes (Signed)
 Physical Therapy Treatment Patient Details Name: [name] MRN: [id number] DOB: [birth year] Today's Date: 08/31/2023   History of Present Illness [name] is a 78 y/o male admitted from [facility] 08/20/23 with flank pain, severe sepsis 2* UTI and acute metabolic encephalopathy. Pt with recent admit to [facility] with kidney stones and s/p bil ureteral stent placement 07/13/23.  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    PT Comments  Pt progressing, reports still feels much weaker than his baseline. Pt overall CGA to min assist for bed mobility and transfers this session. Will benefit from rehab at SNF to allow to incr independence and prevent falls    If plan is discharge home, recommend the following: A little help with walking and/or transfers;A little help with bathing/dressing/bathroom;Assist for transportation;Help with stairs or ramp for entrance   Can travel by private vehicle     No  Equipment Recommendations  None recommended by PT    Recommendations for Other Services       Precautions / Restrictions Precautions Precautions: Fall Restrictions Weight Bearing Restrictions Per Provider Order: No     Mobility  Bed Mobility Overal bed mobility: Needs Assistance Bed Mobility: Supine to Sit, Sit to Supine Rolling: Contact guard assist, Supervision Sidelying to sit: Contact guard assist, Supervision, HOB elevated, Used rails   Sit to supine: Supervision   General bed mobility comments: pt able to self assist with cues and incr time    Transfers Overall transfer level: Needs assistance Equipment used: Rolling walker (2 wheels) Transfers: Sit to/from Stand Sit to Stand: Min assist, From elevated surface           General transfer comment: STS x4, cues for hand placement and to power up with LEs.    Ambulation/Gait             Pre-gait  activities: lateral wt shifting, marching in place General Gait Details: lateral steps along EOB with RW and min/CGA. pt declined OOB to chair d/t recliner being uncomfortable, declined amb   Stairs             Wheelchair Mobility     Tilt Bed    Modified Rankin (Stroke Patients Only)       Balance   Sitting-balance support: Feet supported, No upper extremity supported Sitting balance-Leahy Scale: Fair     Standing balance support: During functional activity, Reliant on assistive device for balance, Bilateral upper extremity supported Standing balance-Leahy Scale: Poor                              Communication Communication Communication: Impaired Factors Affecting Communication: Hearing impaired  Cognition Arousal: Alert Behavior During Therapy: Flat affect   PT - Cognitive impairments: No apparent impairments                           Following commands impaired: Only follows one step commands consistently    Cueing Cueing Techniques: Verbal cues, Visual cues, Gestural cues  Exercises      General Comments General comments (skin integrity, edema, etc.): no skin issues noted this day, no SOB      Pertinent Vitals/Pain Pain Assessment Pain Assessment: No/denies pain    Home Living  Prior Function            PT Goals (current goals can now be found in the care plan section) Acute Rehab PT Goals PT Goal Formulation: With patient Time For Goal Achievement: 09/05/23 Potential to Achieve Goals: Fair Progress towards PT goals: Progressing toward goals    Frequency    Min 2X/week      PT Plan      Co-evaluation              AM-PAC PT "6 Clicks" Mobility   Outcome Measure  Help needed turning from your back to your side while in a flat bed without using bedrails?: A Little Help needed moving from lying on your back to sitting on the side of a flat bed without using bedrails?:  A Little Help needed moving to and from a bed to a chair (including a wheelchair)?: A Little Help needed standing up from a chair using your arms (e.g., wheelchair or bedside chair)?: A Little Help needed to walk in hospital room?: A Lot Help needed climbing 3-5 steps with a railing? : Total 6 Click Score: 15    End of Session Equipment Utilized During Treatment: Gait belt Activity Tolerance: Patient tolerated treatment well;Patient limited by fatigue Patient left: in bed;with call bell/phone within reach;with bed alarm set   PT Visit Diagnosis: Unsteadiness on feet (R26.81);Muscle weakness (generalized) (M62.81);Difficulty in walking, not elsewhere classified (R26.2)     Time: 1308-6578 PT Time Calculation (min) (ACUTE ONLY): 15 min  Charges:    $Therapeutic Activity: 8-22 mins PT General Charges $$ ACUTE PT VISIT: 1 Visit                     Vergil Burby, PT  Acute Rehab Dept [facility]) [phone]  08/31/2023    [facility] 08/31/2023, 12:20 PM

## 2023-08-31 NOTE — Progress Notes (Signed)
 PROGRESS NOTE    [name]  VHQ:[id number]  DOB: 1946/03/09  DOA: 08/20/2023 PCP: Patient, [name] Outpatient Specialists:   Hospital course:  Prior documentation: "78 year old with history of renal stones status post bilateral stents 07/13/2023, DM2, HTN, HLD, CAD, paroxysmal A-fib, hypothyroidism, chronic pain, ataxia, GERD, hypothyroidism, chronic anemia comes to the ED for abnormal lab, hypotension and abdominal pain.  Upon admission CT renal stone study done, reviewed by urology showing good stent placement but recommended Foley catheter placement.  Currently started on empiric IV Rocephin .  PT/OT is recommending SNF".  08/31/2023: Patient seen.  No new complaints.  For repeat CT scan of the abdomen and pelvis tomorrow (communicated with the urology team).     Subjective: No complaints. Patient is not a particularly good historian.  Objective: Vitals:   08/30/23 1933 08/31/23 0421 08/31/23 0900 08/31/23 1314  BP: (!) 144/68 (!) 145/66 123/63 120/72  Pulse: 77 78 75 73  Resp: 18 18  16   Temp: 98.4 F (36.9 C) 98.7 F (37.1 C)  98.3 F (36.8 C)  TempSrc: Oral Oral  Oral  SpO2: 95% 96%  97%  Weight:      Height:        Intake/Output Summary (Last 24 hours) at 08/31/2023 1808 Last data filed at 08/31/2023 1037 Gross per 24 hour  Intake 3 ml  Output 600 ml  Net -597 ml   Filed Weights   08/21/23 0406 08/21/23 0900  Weight: 111.1 kg 120.5 kg     Exam:  General: Patient is obese.  Not in any distress.   Eyes: Patient is pale.  CVS: S1-S2, regular  Respiratory: Clear to auscultation.   GI: Obese and nontender.  LE: Mild ankle edema.  Data Reviewed:  Basic Metabolic Panel: Recent Labs  Lab 08/25/23 0422 08/26/23 0448 08/27/23 4132 08/28/23 0417 08/29/23 0505 08/31/23 0624  NA 137 141 138 132* 134* 135  K 3.9 4.5 4.2 4.1 4.5 4.4  CL 109 115* 110 105 104 100  CO2 22 18* 23 20* 26 27  GLUCOSE 267* 213* 217* 169* 165* 194*  BUN 55* 39* 27* 18 14  14   CREATININE 0.91 0.72 0.72 0.78 0.69 0.75  CALCIUM  7.9* 7.9* 7.8* 7.6* 8.1* 8.4*  MG 2.2 1.8 1.8 1.5*  --  1.5*  PHOS  --   --   --   --   --  3.2    CBC: Recent Labs  Lab 08/26/23 0448 08/27/23 0412 08/28/23 0536 08/29/23 0505 08/31/23 0624  WBC 18.2* 15.8* 13.0* 11.0* 8.4  NEUTROABS  --   --   --  8.6* 6.4  HGB 11.6* 11.0* 10.1* 9.6* 9.7*  HCT 35.7* 33.5* 31.7* 30.3* 30.9*  MCV 94.4 95.2 97.5 96.8 96.0  PLT 147* 195 224 263 343     Scheduled Meds:  amLODipine   5 mg Oral Daily   buPROPion   150 mg Oral Daily   carvedilol   6.25 mg Oral BID WC   Chlorhexidine  Gluconate Cloth  6 each Topical Daily   docusate sodium   100 mg Oral BID   folic acid   1 mg Oral Daily   insulin  aspart  0-5 Units Subcutaneous QHS   insulin  aspart  0-6 Units Subcutaneous TID WC   insulin  aspart  3 Units Subcutaneous TID WC   insulin  glargine-yfgn  16 Units Subcutaneous Daily   levothyroxine   50 mcg Oral QAC breakfast   pantoprazole   40 mg Oral QAC breakfast   sertraline   150  mg Oral Daily   sodium chloride  flush  3 mL Intravenous Q12H   sodium chloride  flush  3 mL Intravenous Q12H   tamsulosin   0.4 mg Oral QPM   Continuous Infusions:  cefTRIAXone  (ROCEPHIN )  IV 2 g (08/31/23 0931)     Assessment & Plan:   Rising leukocytosis--2 cm renal abscess seen on CT Abnormal CT of abdomen and pelvis Complicated UTI secondary to E. Coli and Enterobacter S/p lithotripsy and ureteral stent 07/13/2023 -CT abdomen and pelvis done for ongoing leukocytosis revealed 2 cm renal abscess and ongoing pyelonephritis.  IR was consulted for drainage, they note that it is possibly too small to aspirate and since patient has been on Plavix  he is at high risk for bleeding.  -Recommendation is to repeat CT on Monday or Tuesday and consider aspiration after -Plavix  has been off for 5 days.  Plavix  discontinued yesterday 5/15. -Continue IV Rocephin . - Improvement in leukocytosis noted.  WBC has gone from  13-11. 08/31/2023: Repeat CT scan abdomen/pelvis in the morning.    Metabolic encephalopathy -Patient remains reasonably communicative.    DM 2 -Controlled.    AKI Resolved.  PAF HTN Continue Coreg  and Norvasc  Not on anticoagulation as an outpatient for primary stroke prevention  CAD ASA and Plavix  were held in setting of hematuria and thrombocytopenia Platelets have rebounded to normal Plavix  was restarted however discontinued yesterday 5/15 in anticipation of possible aspiration of renal abscess.  GAD Continue Zoloft  and Wellbutrin   Hypomagnesemia: - IV magnesium  2 g x 1 dose. - Repeat renal panel and magnesium  in the morning.  Disposition [facility] and will [facility] when he is medically stable    DVT prophylaxis: SCD Code Status: Full code Family Communication:    Studies: No results found.   Principal Problem:   Severe sepsis (HCC) Active Problems:   Hyperlipidemia   Essential hypertension   Hypothyroidism   Insulin  dependent type 2 diabetes mellitus (HCC)   Nephrolithiasis   Sepsis secondary to UTI (HCC)   Acute metabolic encephalopathy   Hyperkalemia   Hyponatremia   Paroxysmal atrial fibrillation (HCC)   History of CAD (coronary artery disease)   History of ataxia   Thrombocytopenia (HCC)   Bilateral ureteral stent present   Hematuria     Doroteo Gasmen, [facility]  If 7PM-7AM, please contact night-coverage [email or web address]   LOS: 10 days

## 2023-08-31 NOTE — Plan of Care (Signed)

## 2023-09-01 ENCOUNTER — Inpatient Hospital Stay (HOSPITAL_COMMUNITY)

## 2023-09-01 DIAGNOSIS — E872 Acidosis, unspecified: Secondary | ICD-10-CM | POA: Diagnosis not present

## 2023-09-01 DIAGNOSIS — N179 Acute kidney failure, unspecified: Secondary | ICD-10-CM

## 2023-09-01 DIAGNOSIS — N390 Urinary tract infection, site not specified: Secondary | ICD-10-CM | POA: Diagnosis not present

## 2023-09-01 DIAGNOSIS — A419 Sepsis, unspecified organism: Secondary | ICD-10-CM | POA: Diagnosis not present

## 2023-09-01 DIAGNOSIS — Z79899 Other long term (current) drug therapy: Secondary | ICD-10-CM

## 2023-09-01 LAB — RENAL FUNCTION PANEL
Albumin: 2.4 g/dL — ABNORMAL LOW (ref 3.5–5.0)
Anion gap: 8 (ref 5–15)
BUN: 12 mg/dL (ref 8–23)
CO2: 27 mmol/L (ref 22–32)
Calcium: 8.8 mg/dL — ABNORMAL LOW (ref 8.9–10.3)
Chloride: 99 mmol/L (ref 98–111)
Creatinine, Ser: 0.65 mg/dL (ref 0.61–1.24)
GFR, Estimated: >60 mL/min (ref >60)
Glucose, Bld: 144 mg/dL — ABNORMAL HIGH (ref 70–99)
Phosphorus: 3.2 mg/dL (ref 2.5–4.6)
Potassium: 4.4 mmol/L (ref 3.5–5.1)
Sodium: 134 mmol/L — ABNORMAL LOW (ref 135–145)

## 2023-09-01 LAB — GLUCOSE, CAPILLARY
Glucose-Capillary: 135 mg/dL — ABNORMAL HIGH (ref 70–99)
Glucose-Capillary: 148 mg/dL — ABNORMAL HIGH (ref 70–99)
Glucose-Capillary: 126 mg/dL — ABNORMAL HIGH (ref 70–99)
Glucose-Capillary: 147 mg/dL — ABNORMAL HIGH (ref 70–99)

## 2023-09-01 LAB — MAGNESIUM: Magnesium: 1.8 mg/dL (ref 1.7–2.4)

## 2023-09-01 MED ORDER — IOHEXOL 300 MG/ML  SOLN
100.0000 mL | Freq: Once | INTRAMUSCULAR | Status: AC | PRN
  Administered 2023-09-01: 100 mL via INTRAVENOUS

## 2023-09-01 MED ORDER — IOHEXOL 9 MG/ML PO SOLN
ORAL | Status: AC
  Filled 2023-09-01: qty 1000

## 2023-09-01 MED ORDER — IOHEXOL 9 MG/ML PO SOLN
500.0000 mL | ORAL | Status: AC
  Administered 2023-09-01 (×2): 500 mL via ORAL

## 2023-09-01 MED ORDER — SODIUM CHLORIDE (PF) 0.9 % IJ SOLN
INTRAMUSCULAR | Status: AC
  Filled 2023-09-01: qty 50

## 2023-09-01 NOTE — Consult Note (Signed)
 Chief Complaint: Patient was seen in consultation today for left renal fluid collection.  Referring Provider(s): Dr. Bertram Brocks, MD   Supervising Physician: Creasie Doctor  Patient Status: [facility] - In-pt  History of Present Illness: IR was requested for evaluation of left renal fluid collection status post 5 days since last dose Plavix  and with repeat imaging, as requested by IR on 5/16. As per IR note on 5/16, fluid collection was too small to drain.   Assessment and Plan: CT A/P w/ CM today was reviewed by Dr. Jinx Mourning, VIR attending. Per his preliminary review, there is no evidence of interval enlargement of the lesion.  There is therefore, unfortunately, no indication for intervention at this time.  Care team is aware.  Vital Signs: BP (!) 159/69 (BP Location: Right Arm)   Pulse 69   Temp 97.9 F (36.6 C)   Resp 18   Ht 5\' 9"  (1.753 m)   Wt 265 lb 10.5 oz (120.5 kg)   SpO2 98%   BMI 39.23 kg/m   Imaging: CT ABDOMEN PELVIS W CONTRAST Result Date: 08/27/2023 CLINICAL DATA:  Sepsis EXAM: CT ABDOMEN AND PELVIS WITH CONTRAST TECHNIQUE: Multidetector CT imaging of the abdomen and pelvis was performed using the standard protocol following bolus administration of intravenous contrast. RADIATION DOSE REDUCTION: This exam was performed according to the departmental dose-optimization program which includes automated exposure control, adjustment of the mA and/or kV according to patient size and/or use of iterative reconstruction technique. CONTRAST:  OMNIPAQUE  IOHEXOL  300 MG/ML  SOLN COMPARISON:  08/20/2023 FINDINGS: Lower chest: Small bilateral pleural effusions, left greater than right. Minimal dependent lower lobe atelectasis. Hepatobiliary: No focal liver abnormality is seen. Status post cholecystectomy. No biliary dilatation. Pancreas: Unremarkable. No pancreatic ductal dilatation or surrounding inflammatory changes. Spleen: Normal in size without focal abnormality.  Adrenals/Urinary Tract: Heterogeneous areas of decreased enhancement with striated appearance are seen within the posterior mid right kidney, as well as within the upper left kidney and posterior mid left kidney, consistent with bilateral pyelonephritis. There has been near complete resolution of the postprocedural gas within the renal collecting systems and along the serosal surface of the left kidney seen previously. Interval removal of the ureteral stents noted on prior exam. Adjacent to the punctate residual foci of gas along the cortex of the mid left kidney, there is a new 2.4 x 1.8 cm hypodense area identified reference image 40/2, concerning for developing renal abscess. No obstructive uropathy within either kidney. No radiopaque urinary tract calculi. The adrenals and bladder are unremarkable. Stomach/Bowel: No bowel obstruction or ileus. Normal appendix right lower quadrant. No bowel wall thickening or inflammatory change. Vascular/Lymphatic: Aortic atherosclerosis. No enlarged abdominal or pelvic lymph nodes. Reproductive: Prostate is unremarkable. Other: No free intraperitoneal fluid or free gas. No abdominal wall hernia. Musculoskeletal: Prominent subcutaneous edema within the bilateral flanks and lower abdominal wall consistent with third spacing of fluid. No acute or destructive bony abnormalities. Reconstructed images demonstrate no additional findings. IMPRESSION: 1. Heterogeneous decreased cortical enhancement within the bilateral kidneys consistent with pyelonephritis. 2. 2.4 cm left renal hypodensity consistent with developing renal abscess, new since prior exam. 3. Interval removal of bilateral ureteral stents. No evidence of obstructive uropathy. 4. Prominent body wall edema, new since prior study. 5.  Aortic Atherosclerosis (ICD10-I70.0). These results will be called to the ordering clinician or representative by the Radiologist Assistant, and communication documented in the PACS or [employer]. Electronically Signed   By: Bari Boos.D.  On: 08/27/2023 16:20   DG CHEST PORT 1 VIEW Result Date: 08/26/2023 CLINICAL DATA:  Cough EXAM: PORTABLE CHEST 1 VIEW COMPARISON:  Aug 20, 2023 FINDINGS: Subtle right lower lobe basilar hypoventilatory changes. No obvious infiltrates consolidations. No pleural effusions or reactions Heart and mediastinum normal Prior ACDF cervical spine. IMPRESSION: Subtle right lower lobe basilar hypoventilatory changes. Electronically Signed   By: Fredrich Jefferson M.D.   On: 08/26/2023 14:37   CT Renal Stone Study Result Date: 08/20/2023 CLINICAL DATA:  Bilateral flank pain, hematuria. Recent lithotripsy. EXAM: CT ABDOMEN AND PELVIS WITHOUT CONTRAST TECHNIQUE: Multidetector CT imaging of the abdomen and pelvis was performed following the standard protocol without IV contrast. RADIATION DOSE REDUCTION: This exam was performed according to the departmental dose-optimization program which includes automated exposure control, adjustment of the mA and/or kV according to patient size and/or use of iterative reconstruction technique. COMPARISON:  07/13/2023 FINDINGS: Lower chest: Bibasilar atelectasis. No effusions. Coronary artery and aortic atherosclerosis. Hepatobiliary: No focal liver abnormality is seen. Status post cholecystectomy. No biliary dilatation. Pancreas: No focal abnormality or ductal dilatation. Spleen: No focal abnormality.  Normal size. Adrenals/Urinary Tract: Adrenal glands normal. Bilateral ureteral stents are in place. Air within the renal collecting systems bilaterally and bladder. Probable blood clot in the right renal pelvis. Previously seen ureteral stones no longer visualized. There is also air noted along the periphery of the left kidney midpole of unknown etiology. No focal fluid collection in the region to suggest abscess. Stomach/Bowel: Normal appendix. Scattered colonic diverticulosis, most pronounced in the sigmoid colon. No active  diverticulitis. Stomach and small bowel decompressed. Vascular/Lymphatic: Aortic atherosclerosis. No evidence of aneurysm or adenopathy. Reproductive: No visible focal abnormality. Other: No free fluid or free air. Musculoskeletal: No acute bony abnormality. Postoperative and degenerative changes in the lumbar spine. IMPRESSION: Interval placement of bilateral ureteral stents. Previously seen proximal ureteral stones no longer visualized. Air noted within the collecting systems and bladder likely related to recent stent placement. Probable blood clot within the right renal pelvis. No hydronephrosis. Air also noted along the surface of the left kidney at the midpole of unknown etiology. No fluid collection to suggest abscess. Aortic atherosclerosis. Colonic diverticulosis. Electronically Signed   By: Janeece Mechanic M.D.   On: 08/20/2023 22:58   DG Chest Port 1 View Result Date: 08/20/2023 CLINICAL DATA:  Sepsis EXAM: PORTABLE CHEST 1 VIEW COMPARISON:  Chest x-ray 08/01/2022 FINDINGS: The heart size and mediastinal contours are within normal limits. Both lungs are clear. No acute fractures are seen. Cervical spinal fusion plate is present. IMPRESSION: No active disease. Electronically Signed   By: Tyron Gallon M.D.   On: 08/20/2023 22:03   DG C-Arm 1-60 Min-No Report Result Date: 08/18/2023 Fluoroscopy was utilized by the requesting physician.  No radiographic interpretation.   DG C-Arm 1-60 Min-No Report Result Date: 08/18/2023 Fluoroscopy was utilized by the requesting physician.  No radiographic interpretation.    Labs:  CBC: Recent Labs    08/27/23 0412 08/28/23 0536 08/29/23 0505 08/31/23 0624  WBC 15.8* 13.0* 11.0* 8.4  HGB 11.0* 10.1* 9.6* 9.7*  HCT 33.5* 31.7* 30.3* 30.9*  PLT 195 224 263 343    COAGS: Recent Labs    08/20/23 2145 08/21/23 0429 08/21/23 0747 08/28/23 0853  INR 1.2 1.2 1.2 1.1  APTT  --  30  --   --     BMP: Recent Labs    08/28/23 0417 08/29/23 0505  08/31/23 0624 09/01/23 0436  NA 132* 134* 135 134*  K 4.1 4.5 4.4 4.4  CL 105 104 100 99  CO2 20* 26 27 27   GLUCOSE 169* 165* 194* 144*  BUN 18 14 14 12   CALCIUM  7.6* 8.1* 8.4* 8.8*  CREATININE 0.78 0.69 0.75 0.65  GFRNONAA >60 >60 >60 >60    LIVER FUNCTION TESTS: Recent Labs    07/13/23 1319 07/14/23 0630 08/20/23 2145 08/21/23 0429 08/31/23 0624 09/01/23 0436  BILITOT 0.7 0.9 0.7 0.9  --   --   AST 24 17 28 23   --   --   ALT 27 22 21 18   --   --   ALKPHOS 54 46 63 67  --   --   PROT 7.1 6.3* 5.9* 5.5*  --   --   ALBUMIN 4.0 3.4* 2.8* 2.5* 2.1* 2.4*      Thank you for allowing our service to participate in Rollene Clink 's care.  Electronically Signed: Lovena Rubinstein, PA-C   09/01/2023, 3:21 PM

## 2023-09-01 NOTE — Progress Notes (Addendum)
 [facility]                                                                              [name], is a 78 y.o. male, DOB - 18-Mar-1946, ZOX:[id number] Admit date - 08/20/2023    Outpatient Primary MD for the patient is Patient, [name]  LOS - 11  days  Chief Complaint  Patient presents with   Post-op Problem       Brief summary   Patient is a 78 year old male with history of renal stones s/p bilateral stents 07/13/2023, DM2, HTN, HLD, CAD, paroxysmal A-fib, hypothyroidism, chronic pain, ataxia, GERD, hypothyroidism, chronic anemia comes to the ED for abnormal lab, hypotension and abdominal pain. Upon admission CT renal stone study done, reviewed by urology showing good stent placement but recommended Foley catheter placement. Currently started on empiric IV Rocephin . PT/OT is recommending SNF".    Assessment & Plan    Principal Problem:   Severe sepsis (HCC) secondary to complicated UTI, left renal abscess Bilateral nephrolithiasis status post lithotripsy and ureteral stent 07/13/2023 E. Coli bacteremia -Patient met sepsis criteria on admission -Blood cultures 5/8 + E. Coli, U Cx insignificant growth, placed on IV Rocephin  -Urology was consulted, recommended Foley catheter placement. -Due to worsening leukocytosis, ID was consulted, CT abdomen was repeated.  ID recommended removal of the stents as they would be nidus for infection.  Stents were removed by urology. -CT abdomen on 5/15 showed bilateral pyelonephritis, 2.4 cm left renal hypodensity consistent with developing renal abscess, new since prior exam, no obstructive uropathy, interval removal of bilateral ureteral stents - Per urology, recommended IR drainage of the renal abscess.  - IR was consulted, they noted that abscess is too small to aspirate and patient has been on Plavix .  Recommended to repeat CT on Monday or Tuesday and consider aspiration after Plavix  has been off for 5 days.  Plavix  was placed  on hold on 5/15. -Leukocytosis improving - Repeat CT abdomen today Addendum Repeat CT abdomen shows no evidence of interval enlargement of the lesion, per IR no indication for intervention at this time -Will resume Plavix    Acute metabolic encephalopathy - Likely due to #1, improving   Diabetes mellitus type II, uncontrolled with hyperglycemia -  Hemoglobin A1c 9.5 CBG (last 3)  Recent Labs    08/31/23 2202 09/01/23 0742 09/01/23 1218  GLUCAP 120* 135* 148*   Currently on Semglee  16 units daily, NovoLog  3 units 3 times daily AC, sensitive SSI   AKI -Creatinine 3.17 on admission, baseline 0.7-0.8 - Improved, currently at baseline   PAF -Heart rate controlled continue Coreg  -Not on anticoagulation as an outpatient  HTN Continue Coreg  and Norvasc     CAD ASA and Plavix  were held in setting of hematuria and thrombocytopenia -Plavix  on hold since 5/15 in anticipation of possible aspiration of the renal abscess    Generalized anxiety Continue Zoloft  and Wellbutrin    Hypomagnesemia: - Replace as needed  Pressure Injury Documentation: Pressure Injury 08/25/23 Scrotum (Active)  08/25/23 1000  Location: Scrotum  Location Orientation:   Staging:   Wound Description (Comments):   Present on Admission:  Dressing Type Impregnated gauze (petrolatum ) 08/31/23 0548   Obesity class 2 Estimated body mass index is 39.23 kg/m as calculated from the following:   Height as of this encounter: 5\' 9"  (1.753 m).   Weight as of this encounter: 120.5 kg.  Code Status: Full code DVT Prophylaxis:  SCDs Start: 08/21/23 0035 Place TED hose Start: 08/21/23 0035   Level of Care: Level of care: Med-Surg Family Communication: Updated patient Disposition Plan:      Remains inpatient appropriate:      Procedures:    Consultants:   Urology ID IR   Antimicrobials:   Anti-infectives (From admission, onward)    Start     Dose/Rate Route Frequency Ordered Stop   08/26/23 0915   cefTRIAXone  (ROCEPHIN ) 2 g in sodium chloride  0.9 % 100 mL IVPB       Note to Pharmacy: E coli bacteremia from 5/8, rising WBC on po Bactrim  started yesterday.   2 g 200 mL/hr over 30 Minutes Intravenous Every 24 hours 08/26/23 0827     08/25/23 2200  sulfamethoxazole -trimethoprim  (BACTRIM  DS) 800-160 MG per tablet 2 tablet  Status:  Discontinued        2 tablet Oral Every 12 hours 08/25/23 1516 08/26/23 0827   08/25/23 1230  sulfamethoxazole -trimethoprim  (BACTRIM  DS) 800-160 MG per tablet 1 tablet        1 tablet Oral  Once 08/25/23 1139 08/25/23 1254   08/23/23 0200  Vancomycin  (VANCOCIN ) 1,250 mg in sodium chloride  0.9 % 250 mL IVPB  Status:  Discontinued        1,250 mg 166.7 mL/hr over 90 Minutes Intravenous Every 48 hours 08/21/23 0045 08/21/23 1543   08/21/23 1600  cefTRIAXone  (ROCEPHIN ) 2 g in sodium chloride  0.9 % 100 mL IVPB  Status:  Discontinued        2 g 200 mL/hr over 30 Minutes Intravenous Every 24 hours 08/21/23 1543 08/25/23 1516   08/21/23 0100  vancomycin  (VANCOREADY) IVPB 2000 mg/400 mL        2,000 mg 200 mL/hr over 120 Minutes Intravenous  Once 08/21/23 0042 08/21/23 0348   08/21/23 0100  ceFEPIme  (MAXIPIME ) 2 g in sodium chloride  0.9 % 100 mL IVPB  Status:  Discontinued        2 g 200 mL/hr over 30 Minutes Intravenous Every 24 hours 08/21/23 0042 08/21/23 1543   08/20/23 2230  metroNIDAZOLE  (FLAGYL ) IVPB 500 mg  Status:  Discontinued        500 mg 100 mL/hr over 60 Minutes Intravenous Every 12 hours 08/20/23 2217 08/20/23 2225   08/20/23 2230  cefTRIAXone  (ROCEPHIN ) 1 g in sodium chloride  0.9 % 100 mL IVPB        1 g 200 mL/hr over 30 Minutes Intravenous  Once 08/20/23 2226 08/20/23 2334          Medications  amLODipine   5 mg Oral Daily   buPROPion   150 mg Oral Daily   carvedilol   6.25 mg Oral BID WC   Chlorhexidine  Gluconate Cloth  6 each Topical Daily   docusate sodium   100 mg Oral BID   folic acid   1 mg Oral Daily   insulin  aspart  0-5 Units  Subcutaneous QHS   insulin  aspart  0-6 Units Subcutaneous TID WC   insulin  aspart  3 Units Subcutaneous TID WC   insulin  glargine-yfgn  16 Units Subcutaneous Daily   levothyroxine   50 mcg Oral QAC breakfast   pantoprazole   40 mg Oral QAC breakfast   sertraline   150 mg Oral Daily   sodium chloride  flush  3 mL Intravenous Q12H   sodium chloride  flush  3 mL Intravenous Q12H   tamsulosin   0.4 mg Oral QPM      Subjective:   [name] was seen and examined today.  No acute complaints, no fevers or chills nausea vomiting, abdominal pain.     Objective:   Vitals:   08/31/23 1314 08/31/23 2045 09/01/23 0435 09/01/23 1252  BP: 120/72 127/74 (!) 143/66 (!) 159/69  Pulse: 73 69 76 69  Resp: 16 17 18 18   Temp: 98.3 F (36.8 C) 98.2 F (36.8 C) 97.9 F (36.6 C) 97.9 F (36.6 C)  TempSrc: Oral Oral    SpO2: 97% 95% 96% 98%  Weight:      Height:       No intake or output data in the 24 hours ending 09/01/23 1335   Wt Readings from Last 3 Encounters:  08/21/23 120.5 kg  08/18/23 110.2 kg  08/06/23 112.5 kg     Exam General: Alert and oriented, NAD Cardiovascular: S1 S2 auscultated,  RRR Respiratory: Clear to auscultation bilaterally, no wheezing Gastrointestinal: Soft, nontender, nondistended, + bowel sounds Ext: trace pedal edema bilaterally Neuro: no new deficits Psych: Normal affect     Data Reviewed:  I have personally reviewed following labs    CBC Lab Results  Component Value Date   WBC 8.4 08/31/2023   RBC 3.22 (L) 08/31/2023   HGB 9.7 (L) 08/31/2023   HCT 30.9 (L) 08/31/2023   MCV 96.0 08/31/2023   MCH 30.1 08/31/2023   PLT 343 08/31/2023   MCHC 31.4 08/31/2023   RDW 13.7 08/31/2023   LYMPHSABS 1.1 08/31/2023   MONOABS 0.7 08/31/2023   EOSABS 0.1 08/31/2023   BASOSABS 0.0 08/31/2023     Last metabolic panel Lab Results  Component Value Date   NA 134 (L) 09/01/2023   K 4.4 09/01/2023   CL 99 09/01/2023   CO2 27 09/01/2023   BUN 12  09/01/2023   CREATININE 0.65 09/01/2023   GLUCOSE 144 (H) 09/01/2023   GFRNONAA >60 09/01/2023   GFRAA 115 09/23/2019   CALCIUM  8.8 (L) 09/01/2023   PHOS 3.2 09/01/2023   PROT 5.5 (L) 08/21/2023   ALBUMIN 2.4 (L) 09/01/2023   BILITOT 0.9 08/21/2023   ALKPHOS 67 08/21/2023   AST 23 08/21/2023   ALT 18 08/21/2023   ANIONGAP 8 09/01/2023    CBG (last 3)  Recent Labs    08/31/23 2202 09/01/23 0742 09/01/23 1218  GLUCAP 120* 135* 148*      Coagulation Profile: Recent Labs  Lab 08/28/23 0853  INR 1.1     Radiology Studies: I have personally reviewed the imaging studies  No results found.     [name] M.D. [facility] 09/01/2023, 1:35 PM  Available via Epic secure chat 7am-7pm After 7 pm, please refer to night coverage provider listed on amion.

## 2023-09-01 NOTE — Plan of Care (Signed)
  Problem: Education: Goal: Ability to describe self-care measures that may prevent or decrease complications (Diabetes Survival Skills Education) will improve Outcome: Progressing Goal: Individualized Educational Video(s) Outcome: Progressing   Problem: Coping: Goal: Ability to adjust to condition or change in health will improve Outcome: Progressing   Problem: Fluid Volume: Goal: Ability to maintain a balanced intake and output will improve Outcome: Progressing   Problem: Health Behavior/Discharge Planning: Goal: Ability to identify and utilize available resources and services will improve Outcome: Progressing   Problem: Skin Integrity: Goal: Risk for impaired skin integrity will decrease Outcome: Progressing

## 2023-09-01 NOTE — Plan of Care (Signed)
  Problem: Education: Goal: Ability to describe self-care measures that may prevent or decrease complications (Diabetes Survival Skills Education) will improve Outcome: Progressing   Problem: Education: Goal: Knowledge of General Education information will improve Description: Including pain rating scale, medication(s)/side effects and non-pharmacologic comfort measures Outcome: Progressing   Problem: Nutrition: Goal: Adequate nutrition will be maintained Outcome: Progressing   Problem: Elimination: Goal: Will not experience complications related to bowel motility Outcome: Progressing Goal: Will not experience complications related to urinary retention Outcome: Progressing   Problem: Safety: Goal: Ability to remain free from injury will improve Outcome: Progressing   Problem: Skin Integrity: Goal: Risk for impaired skin integrity will decrease Outcome: Progressing

## 2023-09-01 NOTE — Consult Note (Addendum)
 WOC Nurse wound follow up Wound type: DTPI on scrotum. Notice the pt is lying on the area, making pressure on it. Measurement: 6 cm x 1 cm Wound bed: 100% pink with epithelization spots on the wound bed. Drainage (amount, consistency, odor) minimum amount, serous, no odor. Periwound: intact Dressing procedure/placement/frequency: Cleanse scrotum with soap and water, dry and apply Xeroform gauze ([place] [phone]) to wound bed daily, cover with ABD pad, may use mesh underwear to attempt to hold dressing in place.   When I arrived, the pt was not using any kind of dressing, supporting the area by a wet towel.  WOC team will follow weekly.   Please reconsult if further assistance is needed. Thank-you,  Rachel Budds BSN, RN, [employer], WOC  (Pager: [phone])

## 2023-09-02 ENCOUNTER — Other Ambulatory Visit: Payer: Self-pay

## 2023-09-02 DIAGNOSIS — N179 Acute kidney failure, unspecified: Secondary | ICD-10-CM | POA: Diagnosis not present

## 2023-09-02 DIAGNOSIS — N390 Urinary tract infection, site not specified: Secondary | ICD-10-CM | POA: Diagnosis not present

## 2023-09-02 DIAGNOSIS — D72829 Elevated white blood cell count, unspecified: Secondary | ICD-10-CM

## 2023-09-02 DIAGNOSIS — R7881 Bacteremia: Secondary | ICD-10-CM

## 2023-09-02 DIAGNOSIS — A419 Sepsis, unspecified organism: Secondary | ICD-10-CM | POA: Diagnosis not present

## 2023-09-02 DIAGNOSIS — N151 Renal and perinephric abscess: Secondary | ICD-10-CM

## 2023-09-02 DIAGNOSIS — B9620 Unspecified Escherichia coli [E. coli] as the cause of diseases classified elsewhere: Secondary | ICD-10-CM

## 2023-09-02 DIAGNOSIS — N12 Tubulo-interstitial nephritis, not specified as acute or chronic: Secondary | ICD-10-CM

## 2023-09-02 DIAGNOSIS — N200 Calculus of kidney: Secondary | ICD-10-CM | POA: Diagnosis not present

## 2023-09-02 DIAGNOSIS — E872 Acidosis, unspecified: Secondary | ICD-10-CM | POA: Diagnosis not present

## 2023-09-02 LAB — CBC
HCT: 33.1 % — ABNORMAL LOW (ref 39.0–52.0)
Hemoglobin: 10.4 g/dL — ABNORMAL LOW (ref 13.0–17.0)
MCH: 30.2 pg (ref 26.0–34.0)
MCHC: 31.4 g/dL (ref 30.0–36.0)
MCV: 96.2 fL (ref 80.0–100.0)
Platelets: 360 10*3/uL (ref 150–400)
RBC: 3.44 MIL/uL — ABNORMAL LOW (ref 4.22–5.81)
RDW: 13.2 % (ref 11.5–15.5)
WBC: 7.8 10*3/uL (ref 4.0–10.5)
nRBC: 0 % (ref 0.0–0.2)

## 2023-09-02 LAB — RENAL FUNCTION PANEL
Albumin: 2.6 g/dL — ABNORMAL LOW (ref 3.5–5.0)
Anion gap: 8 (ref 5–15)
BUN: 11 mg/dL (ref 8–23)
CO2: 24 mmol/L (ref 22–32)
Calcium: 8.9 mg/dL (ref 8.9–10.3)
Chloride: 101 mmol/L (ref 98–111)
Creatinine, Ser: 0.51 mg/dL — ABNORMAL LOW (ref 0.61–1.24)
GFR, Estimated: >60 mL/min (ref >60)
Glucose, Bld: 140 mg/dL — ABNORMAL HIGH (ref 70–99)
Phosphorus: 3.7 mg/dL (ref 2.5–4.6)
Potassium: 4.3 mmol/L (ref 3.5–5.1)
Sodium: 133 mmol/L — ABNORMAL LOW (ref 135–145)

## 2023-09-02 LAB — GLUCOSE, CAPILLARY
Glucose-Capillary: 141 mg/dL — ABNORMAL HIGH (ref 70–99)
Glucose-Capillary: 159 mg/dL — ABNORMAL HIGH (ref 70–99)
Glucose-Capillary: 163 mg/dL — ABNORMAL HIGH (ref 70–99)
Glucose-Capillary: 156 mg/dL — ABNORMAL HIGH (ref 70–99)

## 2023-09-02 MED ORDER — CLOPIDOGREL BISULFATE 75 MG PO TABS
75.0000 mg | ORAL_TABLET | Freq: Every day | ORAL | Status: DC
  Administered 2023-09-02 – 2023-09-03 (×2): 75 mg via ORAL
  Filled 2023-09-02 (×2): qty 1

## 2023-09-02 NOTE — Consult Note (Signed)
 [facility]  Total days of antibiotics 8/ceftriaxone  2gm iv daily  Reason for Consult: renal abscess. E.coli bacteremia    Referring Physician: Thelma Fire  Principal Problem:   Severe sepsis (HCC) Active Problems:   Hyperlipidemia   Essential hypertension   Hypothyroidism   Insulin  dependent type 2 diabetes mellitus (HCC)   Nephrolithiasis   Sepsis secondary to UTI (HCC)   Acute metabolic encephalopathy   Hyperkalemia   Hyponatremia   Paroxysmal atrial fibrillation (HCC)   History of CAD (coronary artery disease)   History of ataxia   Thrombocytopenia (HCC)   Bilateral ureteral stent present   Hematuria    HPI: [name] is a 78 y.o. male with hx of CAD, T2DM, and nephrolithiasis underwent bilateral ureterscopy and lithotripsy and stent placement on 5/6  but over the course of next 48-72hr developed bilateral flank pain,  hematuria, and solomnence. In the ED he was found to have hypotension, elevated LA of 4.7. labs showed mild leukocytosis of 11K. His imaging showed evidence of bilateral pyelonephritis, and ureteral stents were removed. In his left kidney there was a 2.4 x 1.8cm lesion concern for renal abscess. infectious work up showed e.coli in blood cx and urine cx. He was empirically started on ceftriaxone  but kept on it based on sensitivities of isolate. He underwent repeat CT that showed persistent perinephric stranding and focal hypodense collection in left kidney slightly smaller at 2 x 1.4cm in comparison to the above measure. ID asked to weigh in on management of renal abscess, currently day 8  Past Medical History:  Diagnosis Date   Abnormal stress test    Anxiety    Arthritis    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HCC) 03/14/2013   Atypical chest pain 12/28/2013   CAD in native artery 03/14/2013   Cervical disc disorder at C5-C6 level with radiculopathy 06/09/2022   Cervical radiculopathy at C5  06/10/2022   Chest pain 07/30/2011   1999 Cath - <50% stenosis 2008 NUC - low risk    Chronic back pain    herniated disc   Coronary artery disease    takes Plavix  and ASA daily   Depression    takes Prozac  daily   Diabetes mellitus type 2, controlled, with complications (HCC) 07/30/2011   Displacement of lumbar intervertebral disc without myelopathy 05/30/2014   Diverticulitis    hx of   Dizziness    occasionally and states Dr.Smith is aware   DJD (degenerative joint disease)    Essential hypertension, benign 07/30/2011   Fatigue 09/06/2019   GERD (gastroesophageal reflux disease)    takes an OTC antacid   Gout    yrs ago and doesn't take any meds   Hard of hearing    wears hearing aids   Headache(784.0)    occasionally   History of colon polyps    benign   History of fusion of cervical spine 09/04/2020   Last Assessment & Plan:   Formatting of this note might be different from the original.  Review of recent cervical spine CT on care everywhere notes ACDF from C5-7 without hardware complication.  At this time he is doing well without any symptomatic issues.   History of kidney stones    HNP (herniated nucleus pulposus), lumbar 06/14/2014   Hyperlipidemia    takes Atorvastatin  daily   Hypertension    takes Amlodipine ,Losartan ,and Coreg  daily   Hypothyroidism    takes Synthroid  daily  IBS (irritable bowel syndrome)    Lumbar post-laminectomy syndrome 09/04/2020   Last Assessment & Plan:   Formatting of this note might be different from the original.  78 year old male with chronic low back pain history of multiple lumbar spine surgeries, he has had a multilevel decompression and is fused from L4 to the sacrum.  His lumbar MRI completed in 2017 shows improvement compared with prior status post left L3-4 diskectomy in March of 2016 there is mild residual mass   Nontraumatic complete tear of right rotator cuff 06/09/2022   Obesity, unspecified 07/30/2011   Other spondylosis,  lumbar region 02/12/2021   Pain medication agreement 09/04/2020   Last Assessment & Plan:   Formatting of this note might be different from the original.  Agreement up-to-date.  Patient and I have discussed the hazardous effects of continued opiate pain medication usage. Risks and benefits of above medications including but not limited to possibility of respiratory depression, sedation, and even death were discussed with the patient who expressed an understandin   PAT (paroxysmal atrial tachycardia) (HCC) 10/12/2020   Preoperative cardiovascular examination 09/06/2019   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   Right sided weakness 06/04/2022   Shortness of breath dyspnea    with exertion   Swelling of lower limb 06/20/2014   Tuberculosis    Patient denies Tb   Type II diabetes mellitus (HCC)    takes Metformin  daily   Unstable angina (HCC) 12/27/2013   Weakness    numbness and tingling in left leg    Allergies: No Known Allergies  Sochx: lives at [facility]. 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 [employer]   MEDICATIONS:  amLODipine   5 mg Oral Daily   buPROPion   150 mg Oral Daily   carvedilol   6.25 mg Oral BID WC   Chlorhexidine  Gluconate Cloth  6 each Topical Daily   clopidogrel   75 mg Oral Q breakfast   docusate sodium   100 mg Oral BID   folic acid   1 mg Oral Daily   insulin  aspart  0-5 Units Subcutaneous QHS   insulin  aspart  0-6 Units Subcutaneous TID WC   insulin  aspart  3 Units Subcutaneous TID WC   insulin  glargine-yfgn  16 Units Subcutaneous Daily   levothyroxine   50 mcg Oral QAC breakfast   pantoprazole   40 mg Oral QAC breakfast   sertraline   150 mg Oral Daily   sodium chloride  flush  3 mL Intravenous Q12H   sodium chloride  flush  3 mL Intravenous Q12H   tamsulosin   0.4 mg Oral QPM    Social History   Tobacco Use   Smoking status: Former   Smokeless tobacco: Current    Types: Chew  Substance Use Topics   Alcohol use: No   Drug  use: No    Family History  Problem Relation Age of Onset   Cancer Father        colon   Arthritis Mother    Healthy Sister    Heart attack Brother    Heart disease Brother    Hypertension Brother    Healthy Sister     Review of Systems -  12 point ros is negative except what is mentioned above  OBJECTIVE: Temp:  [97.8 F (36.6 C)-98.3 F (36.8 C)] 97.8 F (36.6 C) (05/21 1251) Pulse Rate:  [69-77] 69 (05/21 1251) Resp:  [18] 18 (05/21 0916) BP: (113-159)/(49-66) 140/61 (05/21 1251) SpO2:  [96 %-100 %] 100 % (05/21 1251) Physical Exam  Constitutional: He  is oriented to person, place, and time. He appears well-developed and well-nourished. No distress.  HENT:  Mouth/Throat: Oropharynx is clear and moist. No oropharyngeal exudate.  Cardiovascular: Normal rate, regular rhythm and normal heart sounds. Exam reveals no gallop and no friction rub.  No murmur heard.  Pulmonary/Chest: Effort normal and breath sounds normal. No respiratory distress. He has no wheezes.  Abdominal: Soft. Bowel sounds are normal. He exhibits no distension. There is no tenderness.  Lymphadenopathy:  He has no cervical adenopathy.  Neurological: He is alert and oriented to person, place, and time.  Skin: Skin is warm and dry. No rash noted. No erythema.  Psychiatric: He has a normal mood and affect. His behavior is normal.    LABS: Results for orders placed or performed during the hospital encounter of 08/20/23 (from the past 48 hours)  Glucose, capillary     Status: Abnormal   Collection Time: 08/31/23  5:10 PM  Result Value Ref Range   Glucose-Capillary 149 (H) 70 - 99 mg/dL    Comment: Glucose reference range applies only to samples taken after fasting for at least 8 hours.  Glucose, capillary     Status: Abnormal   Collection Time: 08/31/23 10:02 PM  Result Value Ref Range   Glucose-Capillary 120 (H) 70 - 99 mg/dL    Comment: Glucose reference range applies only to samples taken after fasting  for at least 8 hours.   Comment 1 Notify RN    Comment 2 Document in Chart   Renal function panel     Status: Abnormal   Collection Time: 09/01/23  4:36 AM  Result Value Ref Range   Sodium 134 (L) 135 - 145 mmol/L   Potassium 4.4 3.5 - 5.1 mmol/L   Chloride 99 98 - 111 mmol/L   CO2 27 22 - 32 mmol/L   Glucose, Bld 144 (H) 70 - 99 mg/dL    Comment: Glucose reference range applies only to samples taken after fasting for at least 8 hours.   BUN 12 8 - 23 mg/dL   Creatinine, Ser 1.61 0.61 - 1.24 mg/dL   Calcium  8.8 (L) 8.9 - 10.3 mg/dL   Phosphorus 3.2 2.5 - 4.6 mg/dL   Albumin 2.4 (L) 3.5 - 5.0 g/dL   GFR, Estimated >09 >60 mL/min    Comment: (NOTE) Calculated using the CKD-EPI Creatinine Equation (2021)    Anion gap 8 5 - 15    Comment: Performed at [facility], [address] [place], [place] [address]  Magnesium      Status: None   Collection Time: 09/01/23  4:36 AM  Result Value Ref Range   Magnesium  1.8 1.7 - 2.4 mg/dL    Comment: Performed at [facility], [address], [place], [place] [address]  Glucose, capillary     Status: Abnormal   Collection Time: 09/01/23  7:42 AM  Result Value Ref Range   Glucose-Capillary 135 (H) 70 - 99 mg/dL    Comment: Glucose reference range applies only to samples taken after fasting for at least 8 hours.  Glucose, capillary     Status: Abnormal   Collection Time: 09/01/23 12:18 PM  Result Value Ref Range   Glucose-Capillary 148 (H) 70 - 99 mg/dL    Comment: Glucose reference range applies only to samples taken after fasting for at least 8 hours.  Glucose, capillary     Status: Abnormal   Collection Time: 09/01/23  4:44 PM  Result Value Ref Range  Glucose-Capillary 126 (H) 70 - 99 mg/dL    Comment: Glucose reference range applies only to samples taken after fasting for at least 8 hours.  Glucose, capillary     Status: Abnormal   Collection Time: 09/01/23  9:20 PM  Result Value Ref Range    Glucose-Capillary 147 (H) 70 - 99 mg/dL    Comment: Glucose reference range applies only to samples taken after fasting for at least 8 hours.  CBC     Status: Abnormal   Collection Time: 09/02/23  4:45 AM  Result Value Ref Range   WBC 7.8 4.0 - 10.5 K/uL   RBC 3.44 (L) 4.22 - 5.81 MIL/uL   Hemoglobin 10.4 (L) 13.0 - 17.0 g/dL   HCT 42.5 (L) 95.6 - 38.7 %   MCV 96.2 80.0 - 100.0 fL   MCH 30.2 26.0 - 34.0 pg   MCHC 31.4 30.0 - 36.0 g/dL   RDW 56.4 33.2 - 95.1 %   Platelets 360 150 - 400 K/uL   nRBC 0.0 0.0 - 0.2 %    Comment: Performed at [facility], [address], [place], [place] [address]  Renal function panel     Status: Abnormal   Collection Time: 09/02/23  4:45 AM  Result Value Ref Range   Sodium 133 (L) 135 - 145 mmol/L   Potassium 4.3 3.5 - 5.1 mmol/L   Chloride 101 98 - 111 mmol/L   CO2 24 22 - 32 mmol/L   Glucose, Bld 140 (H) 70 - 99 mg/dL    Comment: Glucose reference range applies only to samples taken after fasting for at least 8 hours.   BUN 11 8 - 23 mg/dL   Creatinine, Ser 6.06 (L) 0.61 - 1.24 mg/dL   Calcium  8.9 8.9 - 10.3 mg/dL   Phosphorus 3.7 2.5 - 4.6 mg/dL   Albumin 2.6 (L) 3.5 - 5.0 g/dL   GFR, Estimated >30 >16 mL/min    Comment: (NOTE) Calculated using the CKD-EPI Creatinine Equation (2021)    Anion gap 8 5 - 15    Comment: Performed at [facility], [address] [place], [place] [address]  Glucose, capillary     Status: Abnormal   Collection Time: 09/02/23  7:38 AM  Result Value Ref Range   Glucose-Capillary 141 (H) 70 - 99 mg/dL    Comment: Glucose reference range applies only to samples taken after fasting for at least 8 hours.  Glucose, capillary     Status: Abnormal   Collection Time: 09/02/23 12:16 PM  Result Value Ref Range   Glucose-Capillary 159 (H) 70 - 99 mg/dL    Comment: Glucose reference range applies only to samples taken after fasting for at least 8 hours.    MICRO: Component Ref Range  & Units (hover) 13 d ago  Specimen Description BLOOD RIGHT HAND Performed at [facility], [address] [place], [place] [address]  Special Requests BOTTLES DRAWN AEROBIC AND ANAEROBIC Blood Culture adequate volume Performed at [facility], [address], [place], [place] [address]  Culture  Setup Time GRAM NEGATIVE RODS IN BOTH AEROBIC AND ANAEROBIC BOTTLES CRITICAL RESULT CALLED TO, READ BACK BY AND VERIFIED WITH: PHARMD CHRISTINE SHADE [id number] BY EC Performed at [facility], [address] [place], [place] [address]  Culture ESCHERICHIA COLI Abnormal   Report Status 08/23/2023 FINAL  Organism ID, Bacteria ESCHERICHIA COLI  Organism ID, Bacteria ESCHERICHIA COLI  Resulting Agency [facility]     Susceptibility    Escherichia coli (ZZ00) Escherichia coli (ZZ01)   KIRBY BAUER MIC   AMPICILLIN   >=32 RESIST... Resistant   AMPICILLIN/SULBACTAM   >=32 RESIST... Resistant   CEFAZOLIN  RESISTANT Resistant     CEFEPIME    <=0.12 SENS... Sensitive   CEFTAZIDIME   <=1 SENSITIVE Sensitive   CEFTRIAXONE    <=0.25 SENS... Sensitive   CIPROFLOXACIN    >=4 RESISTANT Resistant   GENTAMICIN   <=1 SENSITIVE Sensitive   IMIPENEM   <=0.25 SENS... Sensitive   PIP/TAZO   8 SENSITIVE... Sensitive   TRIMETH /SULFA    <=20 SENSIT... Sensitive      IMAGING: CT ABDOMEN PELVIS W CONTRAST Result Date: 09/01/2023 CLINICAL DATA:  UTI left renal abscess EXAM: CT ABDOMEN AND PELVIS WITH CONTRAST TECHNIQUE: Multidetector CT imaging of the abdomen and pelvis was performed using the standard protocol following bolus administration of intravenous contrast. RADIATION DOSE REDUCTION: This exam was performed according to the departmental dose-optimization program which includes automated exposure control, adjustment of the mA and/or kV according to patient size and/or use of iterative reconstruction technique. CONTRAST:  OMNIPAQUE  IOHEXOL  300 MG/ML  SOLN COMPARISON:  CT  08/27/2023, 08/20/2023, 08/01/2022 FINDINGS: Lower chest: Lung bases demonstrate resolution of previously noted small right effusion. Persistent small left effusion but decreased. Mild dependent atelectasis in the left base. Coronary vascular calcification Hepatobiliary: No focal liver abnormality is seen. Status post cholecystectomy. No biliary dilatation. Pancreas: Unremarkable. No pancreatic ductal dilatation or surrounding inflammatory changes. Spleen: Normal in size without focal abnormality. Adrenals/Urinary Tract: Adrenal glands are normal. Persistent nonspecific perinephric stranding. Focal hypodense collection at the mid pole left kidney series 2, image 41 measures 2 x 1.4 cm compared with 2.4 x 1.8 cm. No strong rim enhancement. Resolution of previously noted internal gas and gas in the perirenal space. Small intrarenal stones on the left. Suspect small layering stones in the right renal pelvis. Better seen today is focal heterogenous hypoenhancing areas within the the upper right kidney measuring 14 mm on series 2, image 38 and 11 mm posterior midpole on series 2, image 43. Mild urothelial enhancement of left renal pelvis. No hydroureter. The bladder contains a punctate dependent stone on the right side, series 2, image 86. Stomach/Bowel: Moderate fluid distension of the stomach. No dilated small bowel. No acute bowel wall thickening. Diffuse diverticular disease. Negative appendix Vascular/Lymphatic: Aortic atherosclerosis. No enlarged abdominal or pelvic lymph nodes. Reproductive: Prostate is unremarkable. Other: Negative for pelvic effusion or free air. Small fat containing left inguinal hernia Musculoskeletal: Posterior fusion hardware L4 through S1. No acute osseous abnormality. IMPRESSION: 1. Persistent nonspecific perinephric stranding. Focal hypodense collection at the mid pole left kidney measures 2 x 1.4 cm compared with 2.4 x 1.8 cm previously. Resolution of previously noted internal gas and  gas in the left perirenal space. 2. Better seen today is focal heterogenous hypoenhancing areas within the upper and midpole right kidney measuring up to 14 mm, appearance is suspicious for small micro abscesses. Mild urothelial enhancement of left renal pelvis which may be inflammatory or due to upper urinary tract infection. Punctate stone in the bladder. Small intrarenal stones on the left. Small layering stones in the right renal pelvis. No hydronephrosis or hydroureter. 3. Diffuse diverticular disease without acute inflammatory process. 4. Resolution of previously noted small right effusion. Persistent small left effusion but decreased. 5. Aortic atherosclerosis. Aortic Atherosclerosis (ICD10-I70.0). Electronically Signed   By: Esmeralda Hedge M.D.   On: 09/01/2023 19:25  Assessment/Plan:  78yo M with bilateral nephrolithiasis/pyelonephritis, and left renal abscess, in the setting of ecoli bacteremia and uti. Renal abscess slightly smaller after 7 days of iv abtx but stil at 2 cm x 1.4 cm Ecoli isolates sensitivities have been reviewed and recommend to  Plan for 2 more weeks of ceftriaxone  2gm IV daily via picc line.   Abtx End date would be June 4th. Recommend weekly labs with cbc and bmp. Can pull picc line on June 5th.  Recommend follow up with urology at completion of abtx  Leukocytosis = improved, now back to baseline  T2DM, poorly controlled at hgba1c of 9.5 - agree with diabetes management per dr Thelma Fire to help achieve better control.  evaluation of this patient requires complex antimicrobial therapy evaluation and counseling and isolation needs for disease transmission risk assessment and mitigation.

## 2023-09-02 NOTE — TOC Progression Note (Signed)
 Transition of Care [facility]) - Progression Note    Patient Details  Name: [name] MRN: [id number] Date of Birth: 1946-02-12  [facility] CM/SW Contact  Tessie Fila, RN Phone Number: 09/02/2023, 1:32 PM  Clinical Narrative:    Insurance auth started, awaiting approval for STR placement at [facility] in [place]. Per MD pt will be medically ready to DC tomorrow. TOC following.   Expected Discharge Plan: Long Term Nursing Home Barriers to Discharge: Continued Medical Work up  Expected Discharge Plan and Services   Discharge Planning Services: CM Consult   Living arrangements for the past 2 months: Skilled Nursing Facility ([facility])                                       Social Determinants of Health (SDOH) Interventions SDOH Screenings   Food Insecurity: No Food Insecurity (08/21/2023)  Housing: Low Risk  (08/21/2023)  Transportation Needs: No Transportation Needs (08/21/2023)  Utilities: Not At Risk (08/21/2023)  Social Connections: Patient Declined (08/25/2023)  Tobacco Use: High Risk (08/21/2023)    Readmission Risk Interventions     No data to display

## 2023-09-02 NOTE — Plan of Care (Signed)

## 2023-09-02 NOTE — Progress Notes (Signed)
 Subjective: NAEON. Adequate clear yellow UOP.   Objective: Vital signs in last 24 hours: Temp:  [97.9 F (36.6 C)-98.3 F (36.8 C)] 98.3 F (36.8 C) (05/21 0336) Pulse Rate:  [69-77] 77 (05/21 0336) Resp:  [18] 18 (05/21 0336) BP: (113-159)/(49-69) 113/49 (05/21 0336) SpO2:  [96 %-98 %] 96 % (05/21 0336)  Assessment/Plan:  #sepsis #bilateral ureteral stents #AKI # Renal abscess   Normal normothermic, leukocytosis has resolved.  Stopped in to see [name] following his repeat CT scan.  Small interval improvement in his left renal abscess.  No indication for drainage.  IR has seen again.  Repeat CT does show some hypoattenuating areas concerning for microabscess in the contralateral kidney as well.  Considering his UTI, bacteremia, and bilateral renal abscesses, would benefit from input from infectious disease regarding type and duration of oral treatment.  If something reasonable can be established, he is okay to discharge home from urologic perspective.  Follow up in clinic. Please call with questions  Intake/Output from previous day: No intake/output data recorded.  Intake/Output this shift: No intake/output data recorded.  Physical Exam:  General: Alert and oriented CV: No cyanosis Lungs: equal chest rise Gu: Bilateral tethered stents.  Foley catheter removed.  Lab Results: Recent Labs    08/31/23 0624 09/02/23 0445  HGB 9.7* 10.4*  HCT 30.9* 33.1*   BMET Recent Labs    08/31/23 0624 09/01/23 0436 09/02/23 0445  NA 135 134* 133*  K 4.4 4.4 4.3  CL 100 99 101  CO2 27 27 24   GLUCOSE 194* 144* 140*  BUN 14 12 11   CREATININE 0.75 0.65 0.51*  CALCIUM  8.4* 8.8* 8.9  HGB 9.7*  --  10.4*  WBC 8.4  --  7.8     Studies/Results: CT ABDOMEN PELVIS W CONTRAST Result Date: 09/01/2023 CLINICAL DATA:  UTI left renal abscess EXAM: CT ABDOMEN AND PELVIS WITH CONTRAST TECHNIQUE: Multidetector CT imaging of the abdomen and pelvis was performed using the  standard protocol following bolus administration of intravenous contrast. RADIATION DOSE REDUCTION: This exam was performed according to the departmental dose-optimization program which includes automated exposure control, adjustment of the mA and/or kV according to patient size and/or use of iterative reconstruction technique. CONTRAST:  OMNIPAQUE  IOHEXOL  300 MG/ML  SOLN COMPARISON:  CT 08/27/2023, 08/20/2023, 08/01/2022 FINDINGS: Lower chest: Lung bases demonstrate resolution of previously noted small right effusion. Persistent small left effusion but decreased. Mild dependent atelectasis in the left base. Coronary vascular calcification Hepatobiliary: No focal liver abnormality is seen. Status post cholecystectomy. No biliary dilatation. Pancreas: Unremarkable. No pancreatic ductal dilatation or surrounding inflammatory changes. Spleen: Normal in size without focal abnormality. Adrenals/Urinary Tract: Adrenal glands are normal. Persistent nonspecific perinephric stranding. Focal hypodense collection at the mid pole left kidney series 2, image 41 measures 2 x 1.4 cm compared with 2.4 x 1.8 cm. No strong rim enhancement. Resolution of previously noted internal gas and gas in the perirenal space. Small intrarenal stones on the left. Suspect small layering stones in the right renal pelvis. Better seen today is focal heterogenous hypoenhancing areas within the the upper right kidney measuring 14 mm on series 2, image 38 and 11 mm posterior midpole on series 2, image 43. Mild urothelial enhancement of left renal pelvis. No hydroureter. The bladder contains a punctate dependent stone on the right side, series 2, image 86. Stomach/Bowel: Moderate fluid distension of the stomach. No dilated small bowel. No acute bowel wall thickening. Diffuse diverticular disease. Negative  appendix Vascular/Lymphatic: Aortic atherosclerosis. No enlarged abdominal or pelvic lymph nodes. Reproductive: Prostate is unremarkable. Other:  Negative for pelvic effusion or free air. Small fat containing left inguinal hernia Musculoskeletal: Posterior fusion hardware L4 through S1. No acute osseous abnormality. IMPRESSION: 1. Persistent nonspecific perinephric stranding. Focal hypodense collection at the mid pole left kidney measures 2 x 1.4 cm compared with 2.4 x 1.8 cm previously. Resolution of previously noted internal gas and gas in the left perirenal space. 2. Better seen today is focal heterogenous hypoenhancing areas within the upper and midpole right kidney measuring up to 14 mm, appearance is suspicious for small micro abscesses. Mild urothelial enhancement of left renal pelvis which may be inflammatory or due to upper urinary tract infection. Punctate stone in the bladder. Small intrarenal stones on the left. Small layering stones in the right renal pelvis. No hydronephrosis or hydroureter. 3. Diffuse diverticular disease without acute inflammatory process. 4. Resolution of previously noted small right effusion. Persistent small left effusion but decreased. 5. Aortic atherosclerosis. Aortic Atherosclerosis (ICD10-I70.0). Electronically Signed   By: Esmeralda Hedge M.D.   On: 09/01/2023 19:25      LOS: 12 days   Alla Ar, NP [facility] Pager: [phone]  09/02/2023, 8:45 AM

## 2023-09-02 NOTE — Progress Notes (Signed)
 Physical Therapy Treatment Patient Details Name: SERAFIN DECATUR MRN: [id number] DOB: 1945-06-04 Today's Date: 09/02/2023   History of Present Illness Mr. Tullier is a 78 y/o male admitted from [facility] 08/20/23 with flank pain, severe sepsis 2* UTI and acute metabolic encephalopathy. Pt with recent admit to [facility] with kidney stones and s/p bil ureteral stent placement 07/13/23.  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    PT Comments  Pt agreeable to therapy, comes to sitting EOB with min A for trunk uprighting and scooting forward to place feet on floor. Pt powers to stand with min A from elevated bed, using RW. Upon rising, pt noted be soiled in BM; assisted in clean up with pt performing static supported standing at RW for ~2 minute increments. Pt appears to have stress incontinence of bowel, noted to be soiled with each STS rep, notified nursing. Pt amb 10 ft with RW and recliner follow. Pt needing mod A+2 to power up from low seated recliner, blocking bil feet from sliding, cues for hand placement. Pt declines remaining in recliner due to discomfort so assisted back to supine, but pt slightly impulsive and steps on condom catheter while scooting up in bed- notified NT.    If plan is discharge home, recommend the following: A little help with walking and/or transfers;A little help with bathing/dressing/bathroom;Assist for transportation;Help with stairs or ramp for entrance;Assistance with cooking/housework   Can travel by private vehicle     Yes  Equipment Recommendations  None recommended by PT    Recommendations for Other Services       Precautions / Restrictions Precautions Precautions: Fall Recall of Precautions/Restrictions: Intact Restrictions Weight Bearing Restrictions Per Provider Order: No     Mobility  Bed Mobility Overal bed mobility: Needs  Assistance Bed Mobility: Supine to Sit, Sit to Supine     Supine to sit: Min assist, Used rails, HOB elevated Sit to supine: Min assist   General bed mobility comments: min A to upright into sitting, semi rolled to side before powering up, min A to fully upright trunk; min A to lift BLE Back into bed, pt slightly impulsive and pulls condom cath off- notified NT    Transfers Overall transfer level: Needs assistance Equipment used: Rolling walker (2 wheels) Transfers: Sit to/from Stand Sit to Stand: Min assist, From elevated surface, Mod assist, +2 physical assistance   Step pivot transfers: Mod assist       General transfer comment: completed 7 STS reps during session throughout session, min A from elevated bed for first 3 reps; mod A+2 for STS from low seated recliner needing bil feet blocked from sliding, with fatigue needing increased assistance; mod A to step pivot from recliner back to bed    Ambulation/Gait Ambulation/Gait assistance: Min assist, +2 safety/equipment Gait Distance (Feet): 10 Feet Assistive device: Rolling walker (2 wheels) Gait Pattern/deviations: Step-to pattern, Decreased stride length, Trunk flexed       General Gait Details: slow, short steps, generally unsteady with min A, recliner follor for safety   Stairs             Wheelchair Mobility     Tilt Bed    Modified Rankin (Stroke Patients Only)       Balance Overall balance assessment: Needs assistance Sitting-balance support: Feet supported, No upper extremity supported Sitting balance-Leahy Scale: Fair     Standing balance support:  During functional activity, Reliant on assistive device for balance, Bilateral upper extremity supported Standing balance-Leahy Scale: Poor                              Communication Communication Communication: Impaired Factors Affecting Communication: Hearing impaired  Cognition Arousal: Alert Behavior During Therapy: Flat affect    PT - Cognitive impairments: No apparent impairments                       PT - Cognition Comments: pt verbalizes wanting to d/c from hospital back to SNF   Following commands impaired: Only follows one step commands consistently    Cueing Cueing Techniques: Verbal cues, Visual cues, Gestural cues  Exercises      General Comments        Pertinent Vitals/Pain Pain Assessment Pain Assessment: No/denies pain    Home Living                          Prior Function            PT Goals (current goals can now be found in the care plan section) Acute Rehab PT Goals Patient Stated Goal: continue rehab to get strong enough to go home PT Goal Formulation: With patient Time For Goal Achievement: 09/05/23 Potential to Achieve Goals: Fair Progress towards PT goals: Progressing toward goals    Frequency    Min 2X/week      PT Plan      Co-evaluation              AM-PAC PT "6 Clicks" Mobility   Outcome Measure  Help needed turning from your back to your side while in a flat bed without using bedrails?: A Little Help needed moving from lying on your back to sitting on the side of a flat bed without using bedrails?: A Little Help needed moving to and from a bed to a chair (including a wheelchair)?: A Lot Help needed standing up from a chair using your arms (e.g., wheelchair or bedside chair)?: A Lot Help needed to walk in hospital room?: A Lot Help needed climbing 3-5 steps with a railing? : Total 6 Click Score: 13    End of Session Equipment Utilized During Treatment: Gait belt Activity Tolerance: Patient tolerated treatment well;Patient limited by fatigue Patient left: in bed;with call bell/phone within reach;with bed alarm set Nurse Communication: Mobility status;Other (comment) (bowel, condom cath) PT Visit Diagnosis: Unsteadiness on feet (R26.81);Muscle weakness (generalized) (M62.81);Difficulty in walking, not elsewhere classified  (R26.2) Pain - Right/Left: Left Pain - part of body:  (flank)     Time: 1610-9604 PT Time Calculation (min) (ACUTE ONLY): 27 min  Charges:    $Therapeutic Activity: 23-37 mins PT General Charges $$ ACUTE PT VISIT: 1 Visit                     Tori Kyli Sorter PT, DPT 09/02/23, 2:01 PM

## 2023-09-02 NOTE — Progress Notes (Signed)
 [facility]                                                                              [name], is a 78 y.o. male, DOB - [birth year], ZOX:[id number] Admit date - 08/20/2023    Outpatient Primary MD for the [name] is [name], [name]  LOS - 12  days  Chief Complaint  [name] presents with   Post-op Problem       Brief summary   [name] is a 78 year old male with history of renal stones s/p bilateral stents 07/13/2023, DM2, HTN, HLD, CAD, paroxysmal A-fib, hypothyroidism, chronic pain, ataxia, GERD, hypothyroidism, chronic anemia comes to the ED for abnormal lab, hypotension and abdominal pain. Upon admission CT renal stone study done, reviewed by urology showing good stent placement but recommended Foley catheter placement. Currently started on empiric IV Rocephin . PT/OT is recommending SNF".    Assessment & Plan    Principal Problem: Severe sepsis (HCC) secondary to complicated UTI, left renal abscess Bilateral nephrolithiasis status post lithotripsy and ureteral stent 07/13/2023 E. Coli bacteremia -[name] met sepsis criteria on admission -Blood cultures 5/8 + E. Coli, U Cx insignificant growth, placed on IV Rocephin  -Urology was consulted, recommended Foley catheter placement. -Due to worsening leukocytosis, ID was consulted, CT abdomen was repeated.  ID recommended removal of the stents as they would be nidus for infection.  Stents were removed by urology. -CT abdomen on 5/15 showed bilateral pyelonephritis, 2.4 cm left renal hypodensity consistent with developing renal abscess, new since prior exam, no obstructive uropathy, interval removal of bilateral ureteral stents - Per urology, recommended IR drainage of the renal abscess.  - IR was consulted, they noted that abscess is too small to aspirate and [name] has been on Plavix .  Recommended to repeat CT on Monday or Tuesday and consider aspiration after Plavix  has been off for 5 days.  Plavix  was placed  on hold on 5/15. - Repeat CT abdomen on 5/20 shows no evidence of interval enlargement of the lesion, per IR no indication for intervention at this time - Plavix  resumed.  Repeat blood cultures negative - Appreciate urology recommendation, ID/Dr Levern Reader consulted regarding antibiotics, duration, will await recommendations   Acute metabolic encephalopathy - Likely due to #1, improving, close to his baseline    Diabetes mellitus type II, uncontrolled with hyperglycemia -  Hemoglobin A1c 9.5 CBG (last 3)  Recent Labs    09/01/23 2120 09/02/23 0738 09/02/23 1216  GLUCAP 147* 141* 159*   Continue Semglee  16 units daily, NovoLog  3 units 3 times daily AC, sensitive SSI   AKI -Creatinine 3.17 on admission, baseline 0.7-0.8 - Improved, currently at baseline   PAF -Heart rate controlled continue Coreg  -Not on anticoagulation as an outpatient  HTN Continue Coreg  and Norvasc     CAD ASA and Plavix  were held in setting of hematuria and thrombocytopenia Plavix  resumed    Generalized anxiety Continue Zoloft  and Wellbutrin    Hypomagnesemia: - Replace as needed  Pressure Injury Documentation: Pressure Injury 08/25/23 Scrotum (Active)  08/25/23 1000  Location: Scrotum  Location Orientation:   Staging:   Wound Description (Comments):   Present on  Admission:   Dressing Type Impregnated gauze (petrolatum ) 08/31/23 0548   Obesity class 2 Estimated body mass index is 39.23 kg/m as calculated from the following:   Height as of this encounter: 5\' 9"  (1.753 m).   Weight as of this encounter: 120.5 kg.  Code Status: Full code DVT Prophylaxis:  SCDs Start: 08/21/23 0035 Place TED hose Start: 08/21/23 0035   Level of Care: Level of care: Med-Surg Family Communication: Updated [name]'s sister on the phone Disposition Plan:      Remains inpatient appropriate:   Plan to DC to SNF in a.m., awaiting insurance authorization   Procedures:    Consultants:    Urology ID IR   Antimicrobials:   Anti-infectives (From admission, onward)    Start     Dose/Rate Route Frequency Ordered Stop   08/26/23 0915  cefTRIAXone  (ROCEPHIN ) 2 g in sodium chloride  0.9 % 100 mL IVPB       Note to Pharmacy: E coli bacteremia from 5/8, rising WBC on po Bactrim  started yesterday.   2 g 200 mL/hr over 30 Minutes Intravenous Every 24 hours 08/26/23 0827     08/25/23 2200  sulfamethoxazole -trimethoprim  (BACTRIM  DS) 800-160 MG per tablet 2 tablet  Status:  Discontinued        2 tablet Oral Every 12 hours 08/25/23 1516 08/26/23 0827   08/25/23 1230  sulfamethoxazole -trimethoprim  (BACTRIM  DS) 800-160 MG per tablet 1 tablet        1 tablet Oral  Once 08/25/23 1139 08/25/23 1254   08/23/23 0200  Vancomycin  (VANCOCIN ) 1,250 mg in sodium chloride  0.9 % 250 mL IVPB  Status:  Discontinued        1,250 mg 166.7 mL/hr over 90 Minutes Intravenous Every 48 hours 08/21/23 0045 08/21/23 1543   08/21/23 1600  cefTRIAXone  (ROCEPHIN ) 2 g in sodium chloride  0.9 % 100 mL IVPB  Status:  Discontinued        2 g 200 mL/hr over 30 Minutes Intravenous Every 24 hours 08/21/23 1543 08/25/23 1516   08/21/23 0100  vancomycin  (VANCOREADY) IVPB 2000 mg/400 mL        2,000 mg 200 mL/hr over 120 Minutes Intravenous  Once 08/21/23 0042 08/21/23 0348   08/21/23 0100  ceFEPIme  (MAXIPIME ) 2 g in sodium chloride  0.9 % 100 mL IVPB  Status:  Discontinued        2 g 200 mL/hr over 30 Minutes Intravenous Every 24 hours 08/21/23 0042 08/21/23 1543   08/20/23 2230  metroNIDAZOLE  (FLAGYL ) IVPB 500 mg  Status:  Discontinued        500 mg 100 mL/hr over 60 Minutes Intravenous Every 12 hours 08/20/23 2217 08/20/23 2225   08/20/23 2230  cefTRIAXone  (ROCEPHIN ) 1 g in sodium chloride  0.9 % 100 mL IVPB        1 g 200 mL/hr over 30 Minutes Intravenous  Once 08/20/23 2226 08/20/23 2334          Medications  amLODipine   5 mg Oral Daily   buPROPion   150 mg Oral Daily   carvedilol   6.25 mg Oral BID WC    Chlorhexidine  Gluconate Cloth  6 each Topical Daily   clopidogrel   75 mg Oral Q breakfast   docusate sodium   100 mg Oral BID   folic acid   1 mg Oral Daily   insulin  aspart  0-5 Units Subcutaneous QHS   insulin  aspart  0-6 Units Subcutaneous TID WC   insulin  aspart  3 Units Subcutaneous TID WC   insulin  glargine-yfgn  16 Units Subcutaneous Daily   levothyroxine   50 mcg Oral QAC breakfast   pantoprazole   40 mg Oral QAC breakfast   sertraline   150 mg Oral Daily   sodium chloride  flush  3 mL Intravenous Q12H   sodium chloride  flush  3 mL Intravenous Q12H   tamsulosin   0.4 mg Oral QPM      Subjective:   [name] was seen and examined today.  No acute complaints, fever chills, chest pain or shortness of breath.  Asking when he is going to be discharged.  Wants to go today.      Objective:   Vitals:   09/01/23 1934 09/02/23 0336 09/02/23 0912 09/02/23 0916  BP: (!) 159/66 (!) 113/49 139/65 139/65  Pulse: 72 77  70  Resp: 18 18  18   Temp: 98.2 F (36.8 C) 98.3 F (36.8 C)  98.2 F (36.8 C)  TempSrc: Oral Oral    SpO2: 96% 96%  96%  Weight:      Height:        Intake/Output Summary (Last 24 hours) at 09/02/2023 1219 Last data filed at 09/02/2023 4696 Gross per 24 hour  Intake 243 ml  Output --  Net 243 ml     Wt Readings from Last 3 Encounters:  08/21/23 120.5 kg  08/18/23 110.2 kg  08/06/23 112.5 kg    Physical Exam General: Alert and oriented x 3, NAD Cardiovascular: S1 S2 clear, RRR.  Respiratory: CTAB, no wheezing Gastrointestinal: Soft, nontender, nondistended, NBS Ext: no pedal edema bilaterally Neuro: no new deficits Psych: Normal affect     Data Reviewed:  I have personally reviewed following labs    CBC Lab Results  Component Value Date   WBC 7.8 09/02/2023   RBC 3.44 (L) 09/02/2023   HGB 10.4 (L) 09/02/2023   HCT 33.1 (L) 09/02/2023   MCV 96.2 09/02/2023   MCH 30.2 09/02/2023   PLT 360 09/02/2023   MCHC 31.4 09/02/2023   RDW 13.2  09/02/2023   LYMPHSABS 1.1 08/31/2023   MONOABS 0.7 08/31/2023   EOSABS 0.1 08/31/2023   BASOSABS 0.0 08/31/2023     Last metabolic panel Lab Results  Component Value Date   NA 133 (L) 09/02/2023   K 4.3 09/02/2023   CL 101 09/02/2023   CO2 24 09/02/2023   BUN 11 09/02/2023   CREATININE 0.51 (L) 09/02/2023   GLUCOSE 140 (H) 09/02/2023   GFRNONAA >60 09/02/2023   GFRAA 115 09/23/2019   CALCIUM  8.9 09/02/2023   PHOS 3.7 09/02/2023   PROT 5.5 (L) 08/21/2023   ALBUMIN 2.6 (L) 09/02/2023   BILITOT 0.9 08/21/2023   ALKPHOS 67 08/21/2023   AST 23 08/21/2023   ALT 18 08/21/2023   ANIONGAP 8 09/02/2023    CBG (last 3)  Recent Labs    09/01/23 2120 09/02/23 0738 09/02/23 1216  GLUCAP 147* 141* 159*      Coagulation Profile: Recent Labs  Lab 08/28/23 0853  INR 1.1     Radiology Studies: I have personally reviewed the imaging studies  CT ABDOMEN PELVIS W CONTRAST Result Date: 09/01/2023 CLINICAL DATA:  UTI left renal abscess EXAM: CT ABDOMEN AND PELVIS WITH CONTRAST TECHNIQUE: Multidetector CT imaging of the abdomen and pelvis was performed using the standard protocol following bolus administration of intravenous contrast. RADIATION DOSE REDUCTION: This exam was performed according to the departmental dose-optimization program which includes automated exposure control, adjustment of the mA and/or kV according to [name] size and/or use of iterative reconstruction technique.  CONTRAST:  OMNIPAQUE  IOHEXOL  300 MG/ML  SOLN COMPARISON:  CT 08/27/2023, 08/20/2023, 08/01/2022 FINDINGS: Lower chest: Lung bases demonstrate resolution of previously noted small right effusion. Persistent small left effusion but decreased. Mild dependent atelectasis in the left base. Coronary vascular calcification Hepatobiliary: No focal liver abnormality is seen. Status post cholecystectomy. No biliary dilatation. Pancreas: Unremarkable. No pancreatic ductal dilatation or surrounding inflammatory  changes. Spleen: Normal in size without focal abnormality. Adrenals/Urinary Tract: Adrenal glands are normal. Persistent nonspecific perinephric stranding. Focal hypodense collection at the mid pole left kidney series 2, image 41 measures 2 x 1.4 cm compared with 2.4 x 1.8 cm. No strong rim enhancement. Resolution of previously noted internal gas and gas in the perirenal space. Small intrarenal stones on the left. Suspect small layering stones in the right renal pelvis. Better seen today is focal heterogenous hypoenhancing areas within the the upper right kidney measuring 14 mm on series 2, image 38 and 11 mm posterior midpole on series 2, image 43. Mild urothelial enhancement of left renal pelvis. No hydroureter. The bladder contains a punctate dependent stone on the right side, series 2, image 86. Stomach/Bowel: Moderate fluid distension of the stomach. No dilated small bowel. No acute bowel wall thickening. Diffuse diverticular disease. Negative appendix Vascular/Lymphatic: Aortic atherosclerosis. No enlarged abdominal or pelvic lymph nodes. Reproductive: Prostate is unremarkable. Other: Negative for pelvic effusion or free air. Small fat containing left inguinal hernia Musculoskeletal: Posterior fusion hardware L4 through S1. No acute osseous abnormality. IMPRESSION: 1. Persistent nonspecific perinephric stranding. Focal hypodense collection at the mid pole left kidney measures 2 x 1.4 cm compared with 2.4 x 1.8 cm previously. Resolution of previously noted internal gas and gas in the left perirenal space. 2. Better seen today is focal heterogenous hypoenhancing areas within the upper and midpole right kidney measuring up to 14 mm, appearance is suspicious for small micro abscesses. Mild urothelial enhancement of left renal pelvis which may be inflammatory or due to upper urinary tract infection. Punctate stone in the bladder. Small intrarenal stones on the left. Small layering stones in the right renal pelvis.  No hydronephrosis or hydroureter. 3. Diffuse diverticular disease without acute inflammatory process. 4. Resolution of previously noted small right effusion. Persistent small left effusion but decreased. 5. Aortic atherosclerosis. Aortic Atherosclerosis (ICD10-I70.0). Electronically Signed   By: Esmeralda Hedge M.D.   On: 09/01/2023 19:25       Telvin Reinders M.D. [facility] 09/02/2023, 12:19 PM  Available via Epic secure chat 7am-7pm After 7 pm, please refer to night coverage provider listed on amion.

## 2023-09-03 DIAGNOSIS — I480 Paroxysmal atrial fibrillation: Secondary | ICD-10-CM | POA: Diagnosis not present

## 2023-09-03 DIAGNOSIS — N136 Pyonephrosis: Secondary | ICD-10-CM | POA: Diagnosis not present

## 2023-09-03 DIAGNOSIS — G8929 Other chronic pain: Secondary | ICD-10-CM | POA: Diagnosis not present

## 2023-09-03 DIAGNOSIS — K760 Fatty (change of) liver, not elsewhere classified: Secondary | ICD-10-CM | POA: Diagnosis not present

## 2023-09-03 DIAGNOSIS — K5730 Diverticulosis of large intestine without perforation or abscess without bleeding: Secondary | ICD-10-CM | POA: Diagnosis not present

## 2023-09-03 DIAGNOSIS — M7062 Trochanteric bursitis, left hip: Secondary | ICD-10-CM | POA: Diagnosis not present

## 2023-09-03 DIAGNOSIS — A419 Sepsis, unspecified organism: Secondary | ICD-10-CM | POA: Diagnosis not present

## 2023-09-03 DIAGNOSIS — N390 Urinary tract infection, site not specified: Secondary | ICD-10-CM | POA: Diagnosis not present

## 2023-09-03 DIAGNOSIS — N179 Acute kidney failure, unspecified: Secondary | ICD-10-CM | POA: Diagnosis not present

## 2023-09-03 DIAGNOSIS — I2510 Atherosclerotic heart disease of native coronary artery without angina pectoris: Secondary | ICD-10-CM | POA: Diagnosis not present

## 2023-09-03 DIAGNOSIS — Z794 Long term (current) use of insulin: Secondary | ICD-10-CM | POA: Diagnosis not present

## 2023-09-03 DIAGNOSIS — R1032 Left lower quadrant pain: Secondary | ICD-10-CM | POA: Diagnosis not present

## 2023-09-03 DIAGNOSIS — R6520 Severe sepsis without septic shock: Secondary | ICD-10-CM | POA: Diagnosis not present

## 2023-09-03 DIAGNOSIS — R7881 Bacteremia: Secondary | ICD-10-CM | POA: Diagnosis not present

## 2023-09-03 DIAGNOSIS — E1165 Type 2 diabetes mellitus with hyperglycemia: Secondary | ICD-10-CM | POA: Diagnosis not present

## 2023-09-03 DIAGNOSIS — M4802 Spinal stenosis, cervical region: Secondary | ICD-10-CM | POA: Diagnosis not present

## 2023-09-03 DIAGNOSIS — M5412 Radiculopathy, cervical region: Secondary | ICD-10-CM | POA: Diagnosis not present

## 2023-09-03 DIAGNOSIS — N200 Calculus of kidney: Secondary | ICD-10-CM | POA: Diagnosis not present

## 2023-09-03 DIAGNOSIS — Z960 Presence of urogenital implants: Secondary | ICD-10-CM | POA: Diagnosis not present

## 2023-09-03 DIAGNOSIS — M545 Low back pain, unspecified: Secondary | ICD-10-CM | POA: Diagnosis not present

## 2023-09-03 DIAGNOSIS — K5792 Diverticulitis of intestine, part unspecified, without perforation or abscess without bleeding: Secondary | ICD-10-CM | POA: Diagnosis not present

## 2023-09-03 DIAGNOSIS — E872 Acidosis, unspecified: Secondary | ICD-10-CM | POA: Diagnosis not present

## 2023-09-03 DIAGNOSIS — Z743 Need for continuous supervision: Secondary | ICD-10-CM | POA: Diagnosis not present

## 2023-09-03 DIAGNOSIS — E785 Hyperlipidemia, unspecified: Secondary | ICD-10-CM | POA: Diagnosis not present

## 2023-09-03 DIAGNOSIS — E118 Type 2 diabetes mellitus with unspecified complications: Secondary | ICD-10-CM | POA: Diagnosis not present

## 2023-09-03 DIAGNOSIS — Z9181 History of falling: Secondary | ICD-10-CM | POA: Diagnosis not present

## 2023-09-03 DIAGNOSIS — M25561 Pain in right knee: Secondary | ICD-10-CM | POA: Diagnosis not present

## 2023-09-03 DIAGNOSIS — E1151 Type 2 diabetes mellitus with diabetic peripheral angiopathy without gangrene: Secondary | ICD-10-CM | POA: Diagnosis not present

## 2023-09-03 DIAGNOSIS — N151 Renal and perinephric abscess: Secondary | ICD-10-CM | POA: Diagnosis not present

## 2023-09-03 DIAGNOSIS — Z7401 Bed confinement status: Secondary | ICD-10-CM | POA: Diagnosis not present

## 2023-09-03 DIAGNOSIS — E039 Hypothyroidism, unspecified: Secondary | ICD-10-CM | POA: Diagnosis not present

## 2023-09-03 DIAGNOSIS — G9341 Metabolic encephalopathy: Secondary | ICD-10-CM | POA: Diagnosis not present

## 2023-09-03 DIAGNOSIS — Z87440 Personal history of urinary (tract) infections: Secondary | ICD-10-CM | POA: Diagnosis not present

## 2023-09-03 LAB — GLUCOSE, CAPILLARY
Glucose-Capillary: 167 mg/dL — ABNORMAL HIGH (ref 70–99)
Glucose-Capillary: 195 mg/dL — ABNORMAL HIGH (ref 70–99)

## 2023-09-03 LAB — RENAL FUNCTION PANEL
Albumin: 2.5 g/dL — ABNORMAL LOW (ref 3.5–5.0)
Anion gap: 8 (ref 5–15)
BUN: 12 mg/dL (ref 8–23)
CO2: 26 mmol/L (ref 22–32)
Calcium: 8.4 mg/dL — ABNORMAL LOW (ref 8.9–10.3)
Chloride: 99 mmol/L (ref 98–111)
Creatinine, Ser: 0.71 mg/dL (ref 0.61–1.24)
GFR, Estimated: >60 mL/min (ref >60)
Glucose, Bld: 148 mg/dL — ABNORMAL HIGH (ref 70–99)
Phosphorus: 3.5 mg/dL (ref 2.5–4.6)
Potassium: 4.1 mmol/L (ref 3.5–5.1)
Sodium: 133 mmol/L — ABNORMAL LOW (ref 135–145)

## 2023-09-03 MED ORDER — CEFTRIAXONE IV (FOR PTA / DISCHARGE USE ONLY): 2.0000 g | INTRAVENOUS | 0 refills | Status: AC

## 2023-09-03 MED ORDER — SODIUM CHLORIDE 0.9% FLUSH
10.0000 mL | Freq: Two times a day (BID) | INTRAVENOUS | Status: DC
  Administered 2023-09-03: 10 mL

## 2023-09-03 MED ORDER — SODIUM CHLORIDE 0.9% FLUSH: 10.0000 mL | INTRAVENOUS | Status: DC | PRN

## 2023-09-03 MED ORDER — INSULIN GLARGINE-YFGN 100 UNIT/ML ~~LOC~~ SOLN
16.0000 [IU] | Freq: Every day | SUBCUTANEOUS | 11 refills | Status: AC
Start: 2023-09-04 — End: ?

## 2023-09-03 MED ORDER — INSULIN ASPART 100 UNIT/ML IJ SOLN
0.0000 [IU] | Freq: Three times a day (TID) | INTRAMUSCULAR | Status: AC
Start: 2023-09-03 — End: ?

## 2023-09-03 NOTE — Discharge Summary (Signed)
 Physician Discharge Summary   Patient: [name] MRN: [id number] DOB: Sep 08, 1945  Admit date:     08/20/2023  Discharge date: 09/03/23  Discharge Physician: Bertram Brocks, MD    PCP: Patient, [name]   Recommendations at discharge:    2 more weeks of ceftriaxone  2gm IV daily via picc line.  IV Rocephin  End date would be June 4th. Recommend weekly labs with cbc and bmp. Can pull picc line on June 5th. Outpatient urology follow-up at the completion of antibiotics  Discharge Diagnoses:    Severe sepsis (HCC)   Sepsis secondary to UTI [facility]) Left renal abscess Bilateral nephrolithiasis status post lithotripsy and ureteral stent placement on 07/13/2023 E. coli bacteremia   Acute metabolic encephalopathy   Hyperkalemia   Hyponatremia   Thrombocytopenia (HCC)   Insulin  dependent type 2 diabetes mellitus (HCC)   Hyperlipidemia   Essential hypertension   Hypothyroidism   Paroxysmal atrial fibrillation (HCC)   History of CAD (coronary artery disease)   Hospital Course:  Patient is a 78 year old male with history of renal stones s/p bilateral stents 07/13/2023, DM2, HTN, HLD, CAD, paroxysmal A-fib, hypothyroidism, chronic pain, ataxia, GERD, hypothyroidism, chronic anemia comes to the ED for abnormal lab, hypotension and abdominal pain. Upon admission CT renal stone study done, reviewed by urology showing good stent placement but recommended Foley catheter placement. Currently started on empiric IV Rocephin . PT/OT is recommending SNF".   Assessment and Plan:  Severe sepsis (HCC) secondary to complicated UTI, left renal abscess Bilateral nephrolithiasis status post lithotripsy and ureteral stent 07/13/2023 E. Coli bacteremia -Patient met sepsis criteria on admission -Blood cultures 5/8 + E. Coli, U Cx insignificant growth, was placed on IV Rocephin  -Urology was consulted, recommended Foley catheter placement. -Due to worsening leukocytosis, ID was consulted, CT abdomen was  repeated.  ID recommended removal of the stents as they would be nidus for infection.  Stents were removed by urology. -CT abdomen on 5/15 showed bilateral pyelonephritis, 2.4 cm left renal hypodensity consistent with developing renal abscess, new since prior exam, no obstructive uropathy, interval removal of bilateral ureteral stents - Per urology, recommended IR drainage of the renal abscess. IR was consulted, they noted that abscess is too small to aspirate and patient has been on Plavix .  Recommended to repeat CT and consider aspiration after Plavix  has been off for 5 days.  Plavix  was placed on hold. - Repeat CT abdomen on 5/20 shows no evidence of interval enlargement of the lesion, per IR no indication for intervention at this time - Plavix  resumed.  Repeat blood cultures negative - Per ID, seen by Dr. Artemio Larry, recommended PICC line and IV Rocephin , end date on June 4.  Follow-up with urology at the completion of antibiotics in 2 weeks.   Acute metabolic encephalopathy - Likely due to #1, improving, close to his baseline     Diabetes mellitus type II, uncontrolled with hyperglycemia -  Hemoglobin A1c 9.5 -Outpatient on sliding scale insulin  and metformin  - Placed on Semglee  16 units daily, continues to scale insulin    AKI -Creatinine 3.17 on admission, baseline 0.7-0.8 - Improved, currently at baseline   PAF -Heart rate controlled continue Coreg  -Not on anticoagulation as an outpatient   HTN Continue Coreg  and Norvasc      CAD ASA and Plavix  were held in setting of hematuria and thrombocytopenia Plavix  resumed    Generalized anxiety Continue Zoloft  and Wellbutrin    Hypomagnesemia: - Replace as needed   Pressure Injury Documentation:  Pressure Injury 08/25/23 Scrotum (Active)  08/25/23 1000  Location: Scrotum  Location Orientation:   Staging:   Wound Description (Comments):   Present on Admission:   Dressing Type Impregnated gauze (petrolatum ) 08/31/23 0548     Obesity class 2 Estimated body mass index is 39.23 kg/m as calculated from the following:   Height as of this encounter: 5\' 9"  (1.753 m).   Weight as of this encounter: 120.5 kg.         Pain control - [place]  Controlled Substance Reporting System database was reviewed. and patient was instructed, not to drive, operate heavy machinery, perform activities at heights, swimming or participation in water activities or provide baby-sitting services while on Pain, Sleep and Anxiety Medications; until their outpatient Physician has advised to do so again. Also recommended to not to take more than prescribed Pain, Sleep and Anxiety Medications.  Consultants: Urology, ID, IR Procedures performed:   Disposition: SNF Diet recommendation: Carb modified diet  DISCHARGE MEDICATION: Allergies as of 09/03/2023   No Known Allergies      Medication List     STOP taking these medications    insulin  lispro 100 UNIT/ML injection Commonly known as: HUMALOG   levofloxacin 250 MG tablet Commonly known as: LEVAQUIN   metFORMIN  1000 MG tablet Commonly known as: GLUCOPHAGE    nitrofurantoin  (macrocrystal-monohydrate) 100 MG capsule Commonly known as: Macrobid        TAKE these medications    acetaminophen  500 MG tablet Commonly known as: TYLENOL  Take 1,000 mg by mouth in the morning, at noon, and at bedtime.   amLODipine  5 MG tablet Commonly known as: NORVASC  Take 1 tablet (5 mg total) by mouth daily.   aspirin  81 MG tablet Take 81 mg by mouth daily.   atorvastatin  80 MG tablet Commonly known as: LIPITOR  Take 80 mg by mouth daily.   bisacodyl  5 MG EC tablet Commonly known as: DULCOLAX Take 2 tablets (10 mg total) by mouth 2 (two) times daily as needed for severe constipation.   buPROPion  150 MG 24 hr tablet Commonly known as: WELLBUTRIN  XL Take 150 mg by mouth daily.   carvedilol  6.25 MG tablet Commonly known as: COREG  Take 6.25 mg by mouth 2 (two) times daily with a  meal.   cefTRIAXone  IVPB Commonly known as: ROCEPHIN  Inject 2 g into the vein daily for 13 days. Indication:  E.coli bacteremia/UTI/renal abscess First Dose: No Last Day of Therapy: 09/16/23 Labs - Once weekly:  CBC/D and BMP May pull PICC June 5 Method of administration: IV Push Method of administration may be changed at the discretion of home infusion pharmacist based upon assessment of the patient and/or caregiver's ability to self-administer the medication ordered.   Cholecalciferol  25 MCG (1000 UT) tablet Take 1,000 Units by mouth daily.   clopidogrel  75 MG tablet Commonly known as: PLAVIX  Take 75 mg by mouth daily with breakfast.   Cranberry 450 MG Tabs Take 1 tablet by mouth daily.   diclofenac  Sodium 1 % Gel Commonly known as: VOLTAREN  Apply 2 g topically every 12 (twelve) hours as needed.   docusate sodium  100 MG capsule Commonly known as: COLACE Take 1 capsule (100 mg total) by mouth 2 (two) times daily as needed for mild constipation.   folic acid  1 MG tablet Commonly known as: FOLVITE  Take 1 tablet (1 mg total) by mouth daily.   hydroxypropyl methylcellulose / hypromellose 2.5 % ophthalmic solution Commonly known as: ISOPTO TEARS / GONIOVISC Place 2 drops into both eyes every 4 (  four) hours as needed for dry eyes.   hyoscyamine  0.125 MG SL tablet Commonly known as: Levsin/SL Place 1 tablet (0.125 mg total) under the tongue every 6 (six) hours as needed (bladder spasms).   insulin  aspart 100 UNIT/ML injection Commonly known as: novoLOG  Inject 0-9 Units into the skin 3 (three) times daily with meals. Sliding scale CBG 70 - 120: 0 units CBG 121 - 150: 1 unit,  CBG 151 - 200: 2 units,  CBG 201 - 250: 3 units,  CBG 251 - 300: 5 units,  CBG 301 - 350: 7 units,  CBG 351 - 400: 9 units   CBG > 400: 9 units and notify your MD   insulin  glargine-yfgn 100 UNIT/ML injection Commonly known as: SEMGLEE  Inject 0.16 mLs (16 Units total) into the skin daily. Start taking on:  Sep 04, 2023   levothyroxine  50 MCG tablet Commonly known as: SYNTHROID  Take 50 mcg by mouth daily before breakfast.   loratadine 10 MG tablet Commonly known as: CLARITIN Take 10 mg by mouth daily.   LORazepam  1 MG tablet Commonly known as: ATIVAN  Take 1 tablet (1 mg total) by mouth at bedtime.   losartan  50 MG tablet Commonly known as: COZAAR  Take 50 mg by mouth daily.   nitroGLYCERIN  0.4 MG SL tablet Commonly known as: NITROSTAT  Place 0.4 mg under the tongue every 5 (five) minutes as needed for chest pain.   ondansetron  8 MG tablet Commonly known as: ZOFRAN  Take 8 mg by mouth every 6 (six) hours as needed for nausea or vomiting.   oxyCODONE  5 MG immediate release tablet Commonly known as: Oxy IR/ROXICODONE  Take 1 tablet (5 mg total) by mouth every 6 (six) hours as needed for severe pain (pain score 7-10).   pantoprazole  40 MG tablet Commonly known as: PROTONIX  Take 1 tablet (40 mg total) by mouth daily before breakfast.   polyethylene glycol powder 17 GM/SCOOP powder Commonly known as: GLYCOLAX /MIRALAX  Take 17 g by mouth in the morning.   saccharomyces boulardii 250 MG capsule Commonly known as: FLORASTOR Take 250 mg by mouth daily.   sennosides-docusate sodium  8.6-50 MG tablet Commonly known as: SENOKOT-S Take 1 tablet by mouth in the morning and at bedtime.   Sertraline  HCl 150 MG Caps Take 150 mg by mouth daily.   shark liver oil-cocoa butter 0.25-88.44 % suppository Commonly known as: PREPARATION H Place 1 suppository rectally every 12 (twelve) hours as needed for hemorrhoids.   simethicone 125 MG chewable tablet Commonly known as: MYLICON Chew 125 mg by mouth every 6 (six) hours as needed for flatulence.   tamsulosin  0.4 MG Caps capsule Commonly known as: FLOMAX  Take 0.4 mg by mouth daily.   traMADol  50 MG tablet Commonly known as: ULTRAM  Take 1 tablet (50 mg total) by mouth every 8 (eight) hours as needed for moderate pain (pain score 4-6). What  changed: reasons to take this   traZODone  100 MG tablet Commonly known as: DESYREL  Take 100 mg by mouth at bedtime.       ASK your doctor about these medications    meloxicam  15 MG tablet Commonly known as: MOBIC  Take 1 tablet (15 mg total) by mouth daily for 10 days. Ask about: Should I take this medication?               Discharge Care Instructions  (From admission, onward)           Start     Ordered   09/03/23 0000  Change dressing  on IV access line weekly and PRN  (Home infusion instructions - [facility] )        09/03/23 1053            Follow-up Information     Mallie Seal, MD Follow up in 2 week(s).   Specialty: Urology Why: for hospital follow-up Contact information: [address] [place] [address] [phone]                Discharge Exam: Cleavon Curls Weights   08/21/23 0406 08/21/23 0900  Weight: 111.1 kg 120.5 kg   S: No acute complaints , no fever chills, chest pain or shortness of breath,  BP 139/64 (BP Location: Left Arm)   Pulse 67   Temp 98 F (36.7 C) (Oral)   Resp 19   Ht 5\' 9"  (1.753 m)   Wt 120.5 kg   SpO2 100%   BMI 39.23 kg/m   Physical Exam General: Alert and oriented x 3, NAD Cardiovascular: S1 S2 clear, RRR.  Respiratory: CTAB, no wheezing Gastrointestinal: Soft, nontender, nondistended, NBS Ext: no pedal edema bilaterally Neuro: no new deficits Psych: Normal affect    Condition at discharge: fair  The results of significant diagnostics from this hospitalization (including imaging, microbiology, ancillary and laboratory) are listed below for reference.   Imaging Studies: US  EKG SITE RITE Result Date: 09/02/2023 If Site Rite image not attached, placement could not be confirmed due to current cardiac rhythm.  CT ABDOMEN PELVIS W CONTRAST Result Date: 09/01/2023 CLINICAL DATA:  UTI left renal abscess EXAM: CT ABDOMEN AND PELVIS WITH CONTRAST TECHNIQUE: Multidetector CT imaging  of the abdomen and pelvis was performed using the standard protocol following bolus administration of intravenous contrast. RADIATION DOSE REDUCTION: This exam was performed according to the departmental dose-optimization program which includes automated exposure control, adjustment of the mA and/or kV according to patient size and/or use of iterative reconstruction technique. CONTRAST:  OMNIPAQUE  IOHEXOL  300 MG/ML  SOLN COMPARISON:  CT 08/27/2023, 08/20/2023, 08/01/2022 FINDINGS: Lower chest: Lung bases demonstrate resolution of previously noted small right effusion. Persistent small left effusion but decreased. Mild dependent atelectasis in the left base. Coronary vascular calcification Hepatobiliary: No focal liver abnormality is seen. Status post cholecystectomy. No biliary dilatation. Pancreas: Unremarkable. No pancreatic ductal dilatation or surrounding inflammatory changes. Spleen: Normal in size without focal abnormality. Adrenals/Urinary Tract: Adrenal glands are normal. Persistent nonspecific perinephric stranding. Focal hypodense collection at the mid pole left kidney series 2, image 41 measures 2 x 1.4 cm compared with 2.4 x 1.8 cm. No strong rim enhancement. Resolution of previously noted internal gas and gas in the perirenal space. Small intrarenal stones on the left. Suspect small layering stones in the right renal pelvis. Better seen today is focal heterogenous hypoenhancing areas within the the upper right kidney measuring 14 mm on series 2, image 38 and 11 mm posterior midpole on series 2, image 43. Mild urothelial enhancement of left renal pelvis. No hydroureter. The bladder contains a punctate dependent stone on the right side, series 2, image 86. Stomach/Bowel: Moderate fluid distension of the stomach. No dilated small bowel. No acute bowel wall thickening. Diffuse diverticular disease. Negative appendix Vascular/Lymphatic: Aortic atherosclerosis. No enlarged abdominal or pelvic lymph  nodes. Reproductive: Prostate is unremarkable. Other: Negative for pelvic effusion or free air. Small fat containing left inguinal hernia Musculoskeletal: Posterior fusion hardware L4 through S1. No acute osseous abnormality. IMPRESSION: 1. Persistent nonspecific perinephric stranding. Focal hypodense collection at the  mid pole left kidney measures 2 x 1.4 cm compared with 2.4 x 1.8 cm previously. Resolution of previously noted internal gas and gas in the left perirenal space. 2. Better seen today is focal heterogenous hypoenhancing areas within the upper and midpole right kidney measuring up to 14 mm, appearance is suspicious for small micro abscesses. Mild urothelial enhancement of left renal pelvis which may be inflammatory or due to upper urinary tract infection. Punctate stone in the bladder. Small intrarenal stones on the left. Small layering stones in the right renal pelvis. No hydronephrosis or hydroureter. 3. Diffuse diverticular disease without acute inflammatory process. 4. Resolution of previously noted small right effusion. Persistent small left effusion but decreased. 5. Aortic atherosclerosis. Aortic Atherosclerosis (ICD10-I70.0). Electronically Signed   By: Esmeralda Hedge M.D.   On: 09/01/2023 19:25   CT ABDOMEN PELVIS W CONTRAST Result Date: 08/27/2023 CLINICAL DATA:  Sepsis EXAM: CT ABDOMEN AND PELVIS WITH CONTRAST TECHNIQUE: Multidetector CT imaging of the abdomen and pelvis was performed using the standard protocol following bolus administration of intravenous contrast. RADIATION DOSE REDUCTION: This exam was performed according to the departmental dose-optimization program which includes automated exposure control, adjustment of the mA and/or kV according to patient size and/or use of iterative reconstruction technique. CONTRAST:  OMNIPAQUE  IOHEXOL  300 MG/ML  SOLN COMPARISON:  08/20/2023 FINDINGS: Lower chest: Small bilateral pleural effusions, left greater than right. Minimal dependent  lower lobe atelectasis. Hepatobiliary: No focal liver abnormality is seen. Status post cholecystectomy. No biliary dilatation. Pancreas: Unremarkable. No pancreatic ductal dilatation or surrounding inflammatory changes. Spleen: Normal in size without focal abnormality. Adrenals/Urinary Tract: Heterogeneous areas of decreased enhancement with striated appearance are seen within the posterior mid right kidney, as well as within the upper left kidney and posterior mid left kidney, consistent with bilateral pyelonephritis. There has been near complete resolution of the postprocedural gas within the renal collecting systems and along the serosal surface of the left kidney seen previously. Interval removal of the ureteral stents noted on prior exam. Adjacent to the punctate residual foci of gas along the cortex of the mid left kidney, there is a new 2.4 x 1.8 cm hypodense area identified reference image 40/2, concerning for developing renal abscess. No obstructive uropathy within either kidney. No radiopaque urinary tract calculi. The adrenals and bladder are unremarkable. Stomach/Bowel: No bowel obstruction or ileus. Normal appendix right lower quadrant. No bowel wall thickening or inflammatory change. Vascular/Lymphatic: Aortic atherosclerosis. No enlarged abdominal or pelvic lymph nodes. Reproductive: Prostate is unremarkable. Other: No free intraperitoneal fluid or free gas. No abdominal wall hernia. Musculoskeletal: Prominent subcutaneous edema within the bilateral flanks and lower abdominal wall consistent with third spacing of fluid. No acute or destructive bony abnormalities. Reconstructed images demonstrate no additional findings. IMPRESSION: 1. Heterogeneous decreased cortical enhancement within the bilateral kidneys consistent with pyelonephritis. 2. 2.4 cm left renal hypodensity consistent with developing renal abscess, new since prior exam. 3. Interval removal of bilateral ureteral stents. No evidence of  obstructive uropathy. 4. Prominent body wall edema, new since prior study. 5.  Aortic Atherosclerosis (ICD10-I70.0). These results will be called to the ordering clinician or representative by the Radiologist Assistant, and communication documented in the PACS or [employer]. Electronically Signed   By: Bobbye Burrow M.D.   On: 08/27/2023 16:20   DG CHEST PORT 1 VIEW Result Date: 08/26/2023 CLINICAL DATA:  Cough EXAM: PORTABLE CHEST 1 VIEW COMPARISON:  Aug 20, 2023 FINDINGS: Subtle right lower lobe basilar hypoventilatory changes. No obvious infiltrates  consolidations. No pleural effusions or reactions Heart and mediastinum normal Prior ACDF cervical spine. IMPRESSION: Subtle right lower lobe basilar hypoventilatory changes. Electronically Signed   By: Fredrich Jefferson M.D.   On: 08/26/2023 14:37   CT Renal Stone Study Result Date: 08/20/2023 CLINICAL DATA:  Bilateral flank pain, hematuria. Recent lithotripsy. EXAM: CT ABDOMEN AND PELVIS WITHOUT CONTRAST TECHNIQUE: Multidetector CT imaging of the abdomen and pelvis was performed following the standard protocol without IV contrast. RADIATION DOSE REDUCTION: This exam was performed according to the departmental dose-optimization program which includes automated exposure control, adjustment of the mA and/or kV according to patient size and/or use of iterative reconstruction technique. COMPARISON:  07/13/2023 FINDINGS: Lower chest: Bibasilar atelectasis. No effusions. Coronary artery and aortic atherosclerosis. Hepatobiliary: No focal liver abnormality is seen. Status post cholecystectomy. No biliary dilatation. Pancreas: No focal abnormality or ductal dilatation. Spleen: No focal abnormality.  Normal size. Adrenals/Urinary Tract: Adrenal glands normal. Bilateral ureteral stents are in place. Air within the renal collecting systems bilaterally and bladder. Probable blood clot in the right renal pelvis. Previously seen ureteral stones no longer visualized. There  is also air noted along the periphery of the left kidney midpole of unknown etiology. No focal fluid collection in the region to suggest abscess. Stomach/Bowel: Normal appendix. Scattered colonic diverticulosis, most pronounced in the sigmoid colon. No active diverticulitis. Stomach and small bowel decompressed. Vascular/Lymphatic: Aortic atherosclerosis. No evidence of aneurysm or adenopathy. Reproductive: No visible focal abnormality. Other: No free fluid or free air. Musculoskeletal: No acute bony abnormality. Postoperative and degenerative changes in the lumbar spine. IMPRESSION: Interval placement of bilateral ureteral stents. Previously seen proximal ureteral stones no longer visualized. Air noted within the collecting systems and bladder likely related to recent stent placement. Probable blood clot within the right renal pelvis. No hydronephrosis. Air also noted along the surface of the left kidney at the midpole of unknown etiology. No fluid collection to suggest abscess. Aortic atherosclerosis. Colonic diverticulosis. Electronically Signed   By: Janeece Mechanic M.D.   On: 08/20/2023 22:58   DG Chest Port 1 View Result Date: 08/20/2023 CLINICAL DATA:  Sepsis EXAM: PORTABLE CHEST 1 VIEW COMPARISON:  Chest x-ray 08/01/2022 FINDINGS: The heart size and mediastinal contours are within normal limits. Both lungs are clear. No acute fractures are seen. Cervical spinal fusion plate is present. IMPRESSION: No active disease. Electronically Signed   By: Tyron Gallon M.D.   On: 08/20/2023 22:03   DG C-Arm 1-60 Min-No Report Result Date: 08/18/2023 Fluoroscopy was utilized by the requesting physician.  No radiographic interpretation.   DG C-Arm 1-60 Min-No Report Result Date: 08/18/2023 Fluoroscopy was utilized by the requesting physician.  No radiographic interpretation.    Microbiology: Results for orders placed or performed during the hospital encounter of 08/20/23  Blood Culture (routine x 2)     Status:  Abnormal   Collection Time: 08/20/23  9:53 PM   Specimen: BLOOD LEFT FOREARM  Result Value Ref Range Status   Specimen Description   Final    BLOOD LEFT FOREARM Performed at [facility], [address], [place], [place] [address]    Special Requests   Final    BOTTLES DRAWN AEROBIC AND ANAEROBIC Blood Culture results may not be optimal due to an inadequate volume of blood received in culture bottles Performed at [facility], [address], [place], [place] [address]    Culture  Setup Time   Final    GRAM NEGATIVE RODS IN BOTH  AEROBIC AND ANAEROBIC BOTTLES CRITICAL VALUE NOTED.  VALUE IS CONSISTENT WITH PREVIOUSLY REPORTED AND CALLED VALUE.    Culture (A)  Final    ESCHERICHIA COLI SUSCEPTIBILITIES PERFORMED ON PREVIOUS CULTURE WITHIN THE LAST 5 DAYS. Performed at [facility], [address] [place], [place] [address]    Report Status 08/23/2023 FINAL  Final  Blood Culture (routine x 2)     Status: Abnormal   Collection Time: 08/20/23  9:53 PM   Specimen: BLOOD RIGHT HAND  Result Value Ref Range Status   Specimen Description   Final    BLOOD RIGHT HAND Performed at [facility], [address], [place], [place] [address]    Special Requests   Final    BOTTLES DRAWN AEROBIC AND ANAEROBIC Blood Culture adequate volume Performed at [facility], [address] [place], [place] [address]    Culture  Setup Time   Final    GRAM NEGATIVE RODS IN BOTH AEROBIC AND ANAEROBIC BOTTLES CRITICAL RESULT CALLED TO, READ BACK BY AND VERIFIED WITH: PHARMD CHRISTINE SHADE [id number] BY EC Performed at [facility], [address], [place], [place] [address]    Culture ESCHERICHIA COLI (A)  Final   Report Status 08/23/2023 FINAL  Final   Organism ID, Bacteria ESCHERICHIA COLI  Final   Organism ID, Bacteria ESCHERICHIA COLI  Final      Susceptibility   Escherichia coli - KIRBY BAUER*    CEFAZOLIN  RESISTANT Resistant     Escherichia coli - MIC*    AMPICILLIN >=32 RESISTANT Resistant     CEFEPIME  <=0.12 SENSITIVE Sensitive     CEFTAZIDIME <=1 SENSITIVE Sensitive     CEFTRIAXONE  <=0.25 SENSITIVE Sensitive     CIPROFLOXACIN  >=4 RESISTANT Resistant     GENTAMICIN <=1 SENSITIVE Sensitive     IMIPENEM <=0.25 SENSITIVE Sensitive     TRIMETH /SULFA  <=20 SENSITIVE Sensitive     AMPICILLIN/SULBACTAM >=32 RESISTANT Resistant     PIP/TAZO 8 SENSITIVE Sensitive ug/mL    * ESCHERICHIA COLI    ESCHERICHIA COLI  Blood Culture ID Panel (Reflexed)     Status: Abnormal   Collection Time: 08/20/23  9:53 PM  Result Value Ref Range Status   Enterococcus faecalis NOT DETECTED NOT DETECTED Final   Enterococcus Faecium NOT DETECTED NOT DETECTED Final   Listeria monocytogenes NOT DETECTED NOT DETECTED Final   Staphylococcus species NOT DETECTED NOT DETECTED Final   Staphylococcus aureus (BCID) NOT DETECTED NOT DETECTED Final   Staphylococcus epidermidis NOT DETECTED NOT DETECTED Final   Staphylococcus lugdunensis NOT DETECTED NOT DETECTED Final   Streptococcus species NOT DETECTED NOT DETECTED Final   Streptococcus agalactiae NOT DETECTED NOT DETECTED Final   Streptococcus pneumoniae NOT DETECTED NOT DETECTED Final   Streptococcus pyogenes NOT DETECTED NOT DETECTED Final   A.calcoaceticus-baumannii NOT DETECTED NOT DETECTED Final   Bacteroides fragilis NOT DETECTED NOT DETECTED Final   Enterobacterales DETECTED (A) NOT DETECTED Final    Comment: Enterobacterales represent a large order of gram negative bacteria, not a single organism. CRITICAL RESULT CALLED TO, READ BACK BY AND VERIFIED WITH: PHARMD CHRISTINE SHADE [id number] AT 1516 BY EC    Enterobacter cloacae complex NOT DETECTED NOT DETECTED Final   Escherichia coli DETECTED (A) NOT DETECTED Final    Comment: CRITICAL RESULT CALLED TO, READ BACK BY AND VERIFIED WITH: PHARMD CHRISTINE SHADE [id number] AT 1516 BY EC    Klebsiella aerogenes NOT DETECTED NOT DETECTED Final    Klebsiella oxytoca  NOT DETECTED NOT DETECTED Final   Klebsiella pneumoniae NOT DETECTED NOT DETECTED Final   Proteus species NOT DETECTED NOT DETECTED Final   Salmonella species NOT DETECTED NOT DETECTED Final   Serratia marcescens NOT DETECTED NOT DETECTED Final   Haemophilus influenzae NOT DETECTED NOT DETECTED Final   Neisseria meningitidis NOT DETECTED NOT DETECTED Final   Pseudomonas aeruginosa NOT DETECTED NOT DETECTED Final   Stenotrophomonas maltophilia NOT DETECTED NOT DETECTED Final   Candida albicans NOT DETECTED NOT DETECTED Final   Candida auris NOT DETECTED NOT DETECTED Final   Candida glabrata NOT DETECTED NOT DETECTED Final   Candida krusei NOT DETECTED NOT DETECTED Final   Candida parapsilosis NOT DETECTED NOT DETECTED Final   Candida tropicalis NOT DETECTED NOT DETECTED Final   Cryptococcus neoformans/gattii NOT DETECTED NOT DETECTED Final   CTX-M ESBL NOT DETECTED NOT DETECTED Final   Carbapenem resistance IMP NOT DETECTED NOT DETECTED Final   Carbapenem resistance KPC NOT DETECTED NOT DETECTED Final   Carbapenem resistance NDM NOT DETECTED NOT DETECTED Final   Carbapenem resist OXA 48 LIKE NOT DETECTED NOT DETECTED Final   Carbapenem resistance VIM NOT DETECTED NOT DETECTED Final    Comment: Performed at [facility], [address], [place], [place] [address]  Urine Culture     Status: Abnormal   Collection Time: 08/20/23 10:36 PM   Specimen: Urine, Random  Result Value Ref Range Status   Specimen Description   Final    URINE, RANDOM Performed at [facility], [address] [place], [place] [address]    Special Requests   Final    NONE Reflexed from [id number] Performed at [facility], [address] [place], [place] [address]    Culture (A)  Final    <10,000 COLONIES/mL INSIGNIFICANT GROWTH Performed at [facility], [address] [place], [place] [address]    Report Status 08/22/2023 FINAL  Final   Culture, blood (Routine X 2) w Reflex to ID Panel     Status: None   Collection Time: 08/26/23  1:47 PM   Specimen: BLOOD RIGHT ARM  Result Value Ref Range Status   Specimen Description   Final    BLOOD RIGHT ARM Performed at [facility], [address] [place], [place] [address]    Special Requests   Final    BOTTLES DRAWN AEROBIC AND ANAEROBIC Blood Culture adequate volume Performed at [facility], [address], [place], [place] [address]    Culture   Final    NO GROWTH 5 DAYS Performed at [facility], [address] [place], [place] [address]    Report Status 08/31/2023 FINAL  Final  Culture, blood (Routine X 2) w Reflex to ID Panel     Status: None   Collection Time: 08/26/23  5:45 PM   Specimen: BLOOD LEFT HAND  Result Value Ref Range Status   Specimen Description   Final    BLOOD LEFT HAND Performed at [facility], [address] [place], [place] [address]    Special Requests   Final    BOTTLES DRAWN AEROBIC ONLY Blood Culture results may not be optimal due to an inadequate volume of blood received in culture bottles Performed at [facility], [address] [place], [place] [address]    Culture   Final    NO GROWTH 5 DAYS Performed at [facility], [address]  [place], [place] [address]    Report Status 08/31/2023 FINAL  Final    Labs: CBC: Recent Labs  Lab 08/28/23 0536 08/29/23 0505 08/31/23 0624 09/02/23 0445  WBC 13.0* 11.0* 8.4 7.8  NEUTROABS  --  8.6* 6.4  --   HGB 10.1* 9.6* 9.7* 10.4*  HCT 31.7* 30.3* 30.9* 33.1*  MCV 97.5 96.8 96.0 96.2  PLT 224 263 343 360   Basic Metabolic Panel: Recent Labs  Lab 08/28/23 0417 08/29/23 0505 08/31/23 0624 09/01/23 0436 09/02/23 0445 09/03/23 0438  NA 132* 134* 135 134* 133* 133*  K 4.1 4.5 4.4 4.4 4.3 4.1  CL 105 104 100 99 101 99  CO2 20* 26 27 27 24 26   GLUCOSE 169* 165* 194* 144* 140* 148*  BUN 18 14 14 12 11 12    CREATININE 0.78 0.69 0.75 0.65 0.51* 0.71  CALCIUM  7.6* 8.1* 8.4* 8.8* 8.9 8.4*  MG 1.5*  --  1.5* 1.8  --   --   PHOS  --   --  3.2 3.2 3.7 3.5   Liver Function Tests: Recent Labs  Lab 08/31/23 0624 09/01/23 0436 09/02/23 0445 09/03/23 0438  ALBUMIN 2.1* 2.4* 2.6* 2.5*   CBG: Recent Labs  Lab 09/02/23 0738 09/02/23 1216 09/02/23 1637 09/02/23 2042 09/03/23 0759  GLUCAP 141* 159* 163* 156* 167*    Discharge time spent: greater than 30 minutes.  Signed: Bertram Brocks, MD [facility] 09/03/2023

## 2023-09-03 NOTE — TOC Transition Note (Signed)
 Transition of Care [facility]) - Discharge Note   Patient Details  Name: [name] MRN: [id number] Date of Birth: 12/22/1945  [facility] CM/SW Contact:  Tessie Fila, RN Phone Number: 09/03/2023, 1:10 PM   Clinical Narrative:    Pt will DC to [facility] for STR. Pt is aware and agreeable. Spoke with pt sister [name] at [phone] to make her aware of pt DC to [facility] and she agrees with DC plan. PTAR called at [phone]. DC packet placed at RN station with signed scripts inside. Nurse given report to call to [phone] for room 104A. There are no other TOC needs at this time.    Final next level of care: Skilled Nursing Facility Barriers to Discharge: No Barriers Identified   Patient Goals and CMS Choice Patient states their goals for this hospitalization and ongoing recovery are:: To return to [facility] and receive STR CMS Medicare.gov Compare Post Acute Care list provided to:: Patient Choice offered to / list presented to : Patient [place] ownership interest in [facility].provided to:: Patient    Discharge Placement              Patient chooses bed at: [facility], [place] Patient to be transferred to facility by: PTAR Name of family member notified: [name] (Sister)  [phone] Patient and family notified of of transfer: 09/03/23  Discharge Plan and Services Additional resources added to the After Visit Summary for     Discharge Planning Services: CM Consult            DME Arranged: N/A DME Agency: NA       HH Arranged: NA HH Agency: NA        Social Drivers of Health (SDOH) Interventions SDOH Screenings   Food Insecurity: No Food Insecurity (08/21/2023)  Housing: Low Risk  (08/21/2023)  Transportation Needs: No Transportation Needs (08/21/2023)  Utilities: Not At Risk (08/21/2023)  Social Connections: Patient Declined (08/25/2023)  Tobacco Use: High Risk (08/21/2023)     Readmission Risk Interventions     No data to display

## 2023-09-03 NOTE — Plan of Care (Signed)

## 2023-09-03 NOTE — Progress Notes (Signed)
 PHARMACY CONSULT NOTE FOR:  OUTPATIENT  PARENTERAL ANTIBIOTIC THERAPY (OPAT)  Indication: E.coli bacteremia/UTI/renal abscess Regimen: ceftriaxone  2 g IV q24h End date: 09/16/23  IV antibiotic discharge orders are pended. To discharging provider:  please sign these orders via discharge navigator,  Select New Orders & click on the button choice - Manage This Unsigned Work.     Thank you for allowing pharmacy to be a part of this patient's care.  Lolita Rise, PharmD, BCPS Clinical Pharmacist 09/03/2023 7:25 AM

## 2023-09-03 NOTE — Progress Notes (Signed)
 Peripherally Inserted Central Catheter Placement  The IV Nurse has discussed with the patient and/or persons authorized to consent for the patient, the purpose of this procedure and the potential benefits and risks involved with this procedure.  The benefits include less needle sticks, lab draws from the catheter, and the patient may be discharged home with the catheter. Risks include, but not limited to, infection, bleeding, blood clot (thrombus formation), and puncture of an artery; nerve damage and irregular heartbeat and possibility to perform a PICC exchange if needed/ordered by physician.  Alternatives to this procedure were also discussed.  Bard Power PICC patient education guide, fact sheet on infection prevention and patient information card has been provided to patient /or left at bedside.    PICC Placement Documentation  PICC Single Lumen 09/03/23 Right Basilic 49 cm 0 cm (Active)  Indication for Insertion or Continuance of Line Home intravenous therapies (PICC only) 09/03/23 0900  Exposed Catheter (cm) 0 cm 09/03/23 0900  Site Assessment Clean, Dry, Intact 09/03/23 0900  Line Status Flushed;Saline locked;Blood return noted 09/03/23 0900  Dressing Type Transparent;Securing device 09/03/23 0900  Dressing Status Antimicrobial disc/dressing in place;Clean, Dry, Intact 09/03/23 0900  Line Care Connections checked and tightened 09/03/23 0900  Line Adjustment (NICU/IV Team Only) No 09/03/23 0900  Dressing Intervention New dressing;Adhesive placed at insertion site (IV team only) 09/03/23 0900  Dressing Change Due 09/10/23 09/03/23 0900       Roseanne Cones Renee 09/03/2023, 9:22 AM

## 2023-09-05 DIAGNOSIS — N200 Calculus of kidney: Secondary | ICD-10-CM | POA: Diagnosis not present

## 2023-09-05 DIAGNOSIS — E1165 Type 2 diabetes mellitus with hyperglycemia: Secondary | ICD-10-CM | POA: Diagnosis not present

## 2023-09-05 DIAGNOSIS — E1151 Type 2 diabetes mellitus with diabetic peripheral angiopathy without gangrene: Secondary | ICD-10-CM | POA: Diagnosis not present

## 2023-09-05 DIAGNOSIS — R7881 Bacteremia: Secondary | ICD-10-CM | POA: Diagnosis not present

## 2023-09-05 DIAGNOSIS — N151 Renal and perinephric abscess: Secondary | ICD-10-CM | POA: Diagnosis not present

## 2023-09-05 DIAGNOSIS — R1032 Left lower quadrant pain: Secondary | ICD-10-CM | POA: Diagnosis not present

## 2023-09-05 DIAGNOSIS — Z87440 Personal history of urinary (tract) infections: Secondary | ICD-10-CM | POA: Diagnosis not present

## 2023-09-09 DIAGNOSIS — L24A9 Irritant contact dermatitis due friction or contact with other specified body fluids: Secondary | ICD-10-CM | POA: Diagnosis not present

## 2023-09-11 DIAGNOSIS — M25561 Pain in right knee: Secondary | ICD-10-CM | POA: Diagnosis not present

## 2023-09-16 DIAGNOSIS — L24A9 Irritant contact dermatitis due friction or contact with other specified body fluids: Secondary | ICD-10-CM | POA: Diagnosis not present

## 2023-09-18 DIAGNOSIS — N200 Calculus of kidney: Secondary | ICD-10-CM | POA: Diagnosis not present

## 2023-09-18 DIAGNOSIS — N132 Hydronephrosis with renal and ureteral calculous obstruction: Secondary | ICD-10-CM | POA: Diagnosis not present

## 2023-09-18 DIAGNOSIS — E1165 Type 2 diabetes mellitus with hyperglycemia: Secondary | ICD-10-CM | POA: Diagnosis not present

## 2023-09-18 DIAGNOSIS — E1151 Type 2 diabetes mellitus with diabetic peripheral angiopathy without gangrene: Secondary | ICD-10-CM | POA: Diagnosis not present

## 2023-09-18 DIAGNOSIS — N151 Renal and perinephric abscess: Secondary | ICD-10-CM | POA: Diagnosis not present

## 2023-09-18 DIAGNOSIS — N3000 Acute cystitis without hematuria: Secondary | ICD-10-CM | POA: Diagnosis not present

## 2023-09-22 DIAGNOSIS — R2681 Unsteadiness on feet: Secondary | ICD-10-CM | POA: Diagnosis not present

## 2023-09-22 DIAGNOSIS — M6281 Muscle weakness (generalized): Secondary | ICD-10-CM | POA: Diagnosis not present

## 2023-09-22 DIAGNOSIS — R278 Other lack of coordination: Secondary | ICD-10-CM | POA: Diagnosis not present

## 2023-09-22 DIAGNOSIS — E118 Type 2 diabetes mellitus with unspecified complications: Secondary | ICD-10-CM | POA: Diagnosis not present

## 2023-09-22 DIAGNOSIS — A419 Sepsis, unspecified organism: Secondary | ICD-10-CM | POA: Diagnosis not present

## 2023-09-22 DIAGNOSIS — N390 Urinary tract infection, site not specified: Secondary | ICD-10-CM | POA: Diagnosis not present

## 2023-09-23 DIAGNOSIS — R278 Other lack of coordination: Secondary | ICD-10-CM | POA: Diagnosis not present

## 2023-09-23 DIAGNOSIS — R2681 Unsteadiness on feet: Secondary | ICD-10-CM | POA: Diagnosis not present

## 2023-09-23 DIAGNOSIS — N390 Urinary tract infection, site not specified: Secondary | ICD-10-CM | POA: Diagnosis not present

## 2023-09-23 DIAGNOSIS — E118 Type 2 diabetes mellitus with unspecified complications: Secondary | ICD-10-CM | POA: Diagnosis not present

## 2023-09-23 DIAGNOSIS — M6281 Muscle weakness (generalized): Secondary | ICD-10-CM | POA: Diagnosis not present

## 2023-09-23 DIAGNOSIS — A419 Sepsis, unspecified organism: Secondary | ICD-10-CM | POA: Diagnosis not present

## 2023-09-24 DIAGNOSIS — E118 Type 2 diabetes mellitus with unspecified complications: Secondary | ICD-10-CM | POA: Diagnosis not present

## 2023-09-24 DIAGNOSIS — N390 Urinary tract infection, site not specified: Secondary | ICD-10-CM | POA: Diagnosis not present

## 2023-09-24 DIAGNOSIS — R278 Other lack of coordination: Secondary | ICD-10-CM | POA: Diagnosis not present

## 2023-09-24 DIAGNOSIS — A419 Sepsis, unspecified organism: Secondary | ICD-10-CM | POA: Diagnosis not present

## 2023-09-24 DIAGNOSIS — R2681 Unsteadiness on feet: Secondary | ICD-10-CM | POA: Diagnosis not present

## 2023-09-24 DIAGNOSIS — M6281 Muscle weakness (generalized): Secondary | ICD-10-CM | POA: Diagnosis not present

## 2023-09-25 DIAGNOSIS — R2681 Unsteadiness on feet: Secondary | ICD-10-CM | POA: Diagnosis not present

## 2023-09-25 DIAGNOSIS — R278 Other lack of coordination: Secondary | ICD-10-CM | POA: Diagnosis not present

## 2023-09-25 DIAGNOSIS — E118 Type 2 diabetes mellitus with unspecified complications: Secondary | ICD-10-CM | POA: Diagnosis not present

## 2023-09-25 DIAGNOSIS — N390 Urinary tract infection, site not specified: Secondary | ICD-10-CM | POA: Diagnosis not present

## 2023-09-25 DIAGNOSIS — A419 Sepsis, unspecified organism: Secondary | ICD-10-CM | POA: Diagnosis not present

## 2023-09-25 DIAGNOSIS — M6281 Muscle weakness (generalized): Secondary | ICD-10-CM | POA: Diagnosis not present

## 2023-09-28 DIAGNOSIS — M6281 Muscle weakness (generalized): Secondary | ICD-10-CM | POA: Diagnosis not present

## 2023-09-28 DIAGNOSIS — R278 Other lack of coordination: Secondary | ICD-10-CM | POA: Diagnosis not present

## 2023-09-28 DIAGNOSIS — N390 Urinary tract infection, site not specified: Secondary | ICD-10-CM | POA: Diagnosis not present

## 2023-09-28 DIAGNOSIS — E118 Type 2 diabetes mellitus with unspecified complications: Secondary | ICD-10-CM | POA: Diagnosis not present

## 2023-09-28 DIAGNOSIS — A419 Sepsis, unspecified organism: Secondary | ICD-10-CM | POA: Diagnosis not present

## 2023-09-28 DIAGNOSIS — N132 Hydronephrosis with renal and ureteral calculous obstruction: Secondary | ICD-10-CM | POA: Diagnosis not present

## 2023-09-28 DIAGNOSIS — R2681 Unsteadiness on feet: Secondary | ICD-10-CM | POA: Diagnosis not present

## 2023-09-29 DIAGNOSIS — M6281 Muscle weakness (generalized): Secondary | ICD-10-CM | POA: Diagnosis not present

## 2023-09-29 DIAGNOSIS — E118 Type 2 diabetes mellitus with unspecified complications: Secondary | ICD-10-CM | POA: Diagnosis not present

## 2023-09-29 DIAGNOSIS — A419 Sepsis, unspecified organism: Secondary | ICD-10-CM | POA: Diagnosis not present

## 2023-09-29 DIAGNOSIS — R278 Other lack of coordination: Secondary | ICD-10-CM | POA: Diagnosis not present

## 2023-09-29 DIAGNOSIS — N390 Urinary tract infection, site not specified: Secondary | ICD-10-CM | POA: Diagnosis not present

## 2023-09-29 DIAGNOSIS — R2681 Unsteadiness on feet: Secondary | ICD-10-CM | POA: Diagnosis not present

## 2023-09-30 DIAGNOSIS — R2681 Unsteadiness on feet: Secondary | ICD-10-CM | POA: Diagnosis not present

## 2023-09-30 DIAGNOSIS — R278 Other lack of coordination: Secondary | ICD-10-CM | POA: Diagnosis not present

## 2023-09-30 DIAGNOSIS — N390 Urinary tract infection, site not specified: Secondary | ICD-10-CM | POA: Diagnosis not present

## 2023-09-30 DIAGNOSIS — A419 Sepsis, unspecified organism: Secondary | ICD-10-CM | POA: Diagnosis not present

## 2023-09-30 DIAGNOSIS — M6281 Muscle weakness (generalized): Secondary | ICD-10-CM | POA: Diagnosis not present

## 2023-09-30 DIAGNOSIS — E118 Type 2 diabetes mellitus with unspecified complications: Secondary | ICD-10-CM | POA: Diagnosis not present

## 2023-10-01 DIAGNOSIS — M6281 Muscle weakness (generalized): Secondary | ICD-10-CM | POA: Diagnosis not present

## 2023-10-01 DIAGNOSIS — R278 Other lack of coordination: Secondary | ICD-10-CM | POA: Diagnosis not present

## 2023-10-01 DIAGNOSIS — E118 Type 2 diabetes mellitus with unspecified complications: Secondary | ICD-10-CM | POA: Diagnosis not present

## 2023-10-01 DIAGNOSIS — N390 Urinary tract infection, site not specified: Secondary | ICD-10-CM | POA: Diagnosis not present

## 2023-10-01 DIAGNOSIS — R2681 Unsteadiness on feet: Secondary | ICD-10-CM | POA: Diagnosis not present

## 2023-10-01 DIAGNOSIS — A419 Sepsis, unspecified organism: Secondary | ICD-10-CM | POA: Diagnosis not present

## 2023-10-02 DIAGNOSIS — R278 Other lack of coordination: Secondary | ICD-10-CM | POA: Diagnosis not present

## 2023-10-02 DIAGNOSIS — E118 Type 2 diabetes mellitus with unspecified complications: Secondary | ICD-10-CM | POA: Diagnosis not present

## 2023-10-02 DIAGNOSIS — R2681 Unsteadiness on feet: Secondary | ICD-10-CM | POA: Diagnosis not present

## 2023-10-02 DIAGNOSIS — A419 Sepsis, unspecified organism: Secondary | ICD-10-CM | POA: Diagnosis not present

## 2023-10-02 DIAGNOSIS — N390 Urinary tract infection, site not specified: Secondary | ICD-10-CM | POA: Diagnosis not present

## 2023-10-02 DIAGNOSIS — M6281 Muscle weakness (generalized): Secondary | ICD-10-CM | POA: Diagnosis not present

## 2023-10-05 DIAGNOSIS — A419 Sepsis, unspecified organism: Secondary | ICD-10-CM | POA: Diagnosis not present

## 2023-10-05 DIAGNOSIS — E118 Type 2 diabetes mellitus with unspecified complications: Secondary | ICD-10-CM | POA: Diagnosis not present

## 2023-10-05 DIAGNOSIS — N390 Urinary tract infection, site not specified: Secondary | ICD-10-CM | POA: Diagnosis not present

## 2023-10-05 DIAGNOSIS — M6281 Muscle weakness (generalized): Secondary | ICD-10-CM | POA: Diagnosis not present

## 2023-10-05 DIAGNOSIS — R2681 Unsteadiness on feet: Secondary | ICD-10-CM | POA: Diagnosis not present

## 2023-10-05 DIAGNOSIS — R278 Other lack of coordination: Secondary | ICD-10-CM | POA: Diagnosis not present

## 2023-10-06 DIAGNOSIS — A419 Sepsis, unspecified organism: Secondary | ICD-10-CM | POA: Diagnosis not present

## 2023-10-06 DIAGNOSIS — R278 Other lack of coordination: Secondary | ICD-10-CM | POA: Diagnosis not present

## 2023-10-06 DIAGNOSIS — R2681 Unsteadiness on feet: Secondary | ICD-10-CM | POA: Diagnosis not present

## 2023-10-06 DIAGNOSIS — E118 Type 2 diabetes mellitus with unspecified complications: Secondary | ICD-10-CM | POA: Diagnosis not present

## 2023-10-06 DIAGNOSIS — M6281 Muscle weakness (generalized): Secondary | ICD-10-CM | POA: Diagnosis not present

## 2023-10-06 DIAGNOSIS — N390 Urinary tract infection, site not specified: Secondary | ICD-10-CM | POA: Diagnosis not present

## 2023-10-07 DIAGNOSIS — L602 Onychogryphosis: Secondary | ICD-10-CM | POA: Diagnosis not present

## 2023-10-07 DIAGNOSIS — Z7984 Long term (current) use of oral hypoglycemic drugs: Secondary | ICD-10-CM | POA: Diagnosis not present

## 2023-10-07 DIAGNOSIS — L603 Nail dystrophy: Secondary | ICD-10-CM | POA: Diagnosis not present

## 2023-10-07 DIAGNOSIS — L89313 Pressure ulcer of right buttock, stage 3: Secondary | ICD-10-CM | POA: Diagnosis not present

## 2023-10-07 DIAGNOSIS — E1151 Type 2 diabetes mellitus with diabetic peripheral angiopathy without gangrene: Secondary | ICD-10-CM | POA: Diagnosis not present

## 2023-10-08 DIAGNOSIS — R2681 Unsteadiness on feet: Secondary | ICD-10-CM | POA: Diagnosis not present

## 2023-10-08 DIAGNOSIS — E118 Type 2 diabetes mellitus with unspecified complications: Secondary | ICD-10-CM | POA: Diagnosis not present

## 2023-10-08 DIAGNOSIS — R278 Other lack of coordination: Secondary | ICD-10-CM | POA: Diagnosis not present

## 2023-10-08 DIAGNOSIS — A419 Sepsis, unspecified organism: Secondary | ICD-10-CM | POA: Diagnosis not present

## 2023-10-08 DIAGNOSIS — M6281 Muscle weakness (generalized): Secondary | ICD-10-CM | POA: Diagnosis not present

## 2023-10-08 DIAGNOSIS — N390 Urinary tract infection, site not specified: Secondary | ICD-10-CM | POA: Diagnosis not present

## 2023-10-09 DIAGNOSIS — R2681 Unsteadiness on feet: Secondary | ICD-10-CM | POA: Diagnosis not present

## 2023-10-09 DIAGNOSIS — E118 Type 2 diabetes mellitus with unspecified complications: Secondary | ICD-10-CM | POA: Diagnosis not present

## 2023-10-09 DIAGNOSIS — A419 Sepsis, unspecified organism: Secondary | ICD-10-CM | POA: Diagnosis not present

## 2023-10-09 DIAGNOSIS — R278 Other lack of coordination: Secondary | ICD-10-CM | POA: Diagnosis not present

## 2023-10-09 DIAGNOSIS — N390 Urinary tract infection, site not specified: Secondary | ICD-10-CM | POA: Diagnosis not present

## 2023-10-09 DIAGNOSIS — M6281 Muscle weakness (generalized): Secondary | ICD-10-CM | POA: Diagnosis not present

## 2023-10-10 DIAGNOSIS — L89313 Pressure ulcer of right buttock, stage 3: Secondary | ICD-10-CM | POA: Diagnosis not present

## 2023-10-12 DIAGNOSIS — R2681 Unsteadiness on feet: Secondary | ICD-10-CM | POA: Diagnosis not present

## 2023-10-12 DIAGNOSIS — A419 Sepsis, unspecified organism: Secondary | ICD-10-CM | POA: Diagnosis not present

## 2023-10-12 DIAGNOSIS — E118 Type 2 diabetes mellitus with unspecified complications: Secondary | ICD-10-CM | POA: Diagnosis not present

## 2023-10-12 DIAGNOSIS — N390 Urinary tract infection, site not specified: Secondary | ICD-10-CM | POA: Diagnosis not present

## 2023-10-12 DIAGNOSIS — M6281 Muscle weakness (generalized): Secondary | ICD-10-CM | POA: Diagnosis not present

## 2023-10-12 DIAGNOSIS — R278 Other lack of coordination: Secondary | ICD-10-CM | POA: Diagnosis not present

## 2023-10-13 DIAGNOSIS — A419 Sepsis, unspecified organism: Secondary | ICD-10-CM | POA: Diagnosis not present

## 2023-10-13 DIAGNOSIS — N390 Urinary tract infection, site not specified: Secondary | ICD-10-CM | POA: Diagnosis not present

## 2023-10-13 DIAGNOSIS — R278 Other lack of coordination: Secondary | ICD-10-CM | POA: Diagnosis not present

## 2023-10-13 DIAGNOSIS — R2681 Unsteadiness on feet: Secondary | ICD-10-CM | POA: Diagnosis not present

## 2023-10-13 DIAGNOSIS — M6281 Muscle weakness (generalized): Secondary | ICD-10-CM | POA: Diagnosis not present

## 2023-10-14 DIAGNOSIS — R278 Other lack of coordination: Secondary | ICD-10-CM | POA: Diagnosis not present

## 2023-10-14 DIAGNOSIS — A419 Sepsis, unspecified organism: Secondary | ICD-10-CM | POA: Diagnosis not present

## 2023-10-14 DIAGNOSIS — M6281 Muscle weakness (generalized): Secondary | ICD-10-CM | POA: Diagnosis not present

## 2023-10-14 DIAGNOSIS — N390 Urinary tract infection, site not specified: Secondary | ICD-10-CM | POA: Diagnosis not present

## 2023-10-14 DIAGNOSIS — R2681 Unsteadiness on feet: Secondary | ICD-10-CM | POA: Diagnosis not present

## 2023-10-14 DIAGNOSIS — L89313 Pressure ulcer of right buttock, stage 3: Secondary | ICD-10-CM | POA: Diagnosis not present

## 2023-10-15 DIAGNOSIS — N390 Urinary tract infection, site not specified: Secondary | ICD-10-CM | POA: Diagnosis not present

## 2023-10-15 DIAGNOSIS — R278 Other lack of coordination: Secondary | ICD-10-CM | POA: Diagnosis not present

## 2023-10-15 DIAGNOSIS — R2681 Unsteadiness on feet: Secondary | ICD-10-CM | POA: Diagnosis not present

## 2023-10-15 DIAGNOSIS — M6281 Muscle weakness (generalized): Secondary | ICD-10-CM | POA: Diagnosis not present

## 2023-10-15 DIAGNOSIS — A419 Sepsis, unspecified organism: Secondary | ICD-10-CM | POA: Diagnosis not present

## 2023-10-16 DIAGNOSIS — R2681 Unsteadiness on feet: Secondary | ICD-10-CM | POA: Diagnosis not present

## 2023-10-16 DIAGNOSIS — N390 Urinary tract infection, site not specified: Secondary | ICD-10-CM | POA: Diagnosis not present

## 2023-10-16 DIAGNOSIS — A419 Sepsis, unspecified organism: Secondary | ICD-10-CM | POA: Diagnosis not present

## 2023-10-16 DIAGNOSIS — R278 Other lack of coordination: Secondary | ICD-10-CM | POA: Diagnosis not present

## 2023-10-16 DIAGNOSIS — M6281 Muscle weakness (generalized): Secondary | ICD-10-CM | POA: Diagnosis not present

## 2023-10-21 DIAGNOSIS — L89313 Pressure ulcer of right buttock, stage 3: Secondary | ICD-10-CM | POA: Diagnosis not present

## 2023-10-21 DIAGNOSIS — A419 Sepsis, unspecified organism: Secondary | ICD-10-CM | POA: Diagnosis not present

## 2023-10-21 DIAGNOSIS — R278 Other lack of coordination: Secondary | ICD-10-CM | POA: Diagnosis not present

## 2023-10-21 DIAGNOSIS — N390 Urinary tract infection, site not specified: Secondary | ICD-10-CM | POA: Diagnosis not present

## 2023-10-21 DIAGNOSIS — R2681 Unsteadiness on feet: Secondary | ICD-10-CM | POA: Diagnosis not present

## 2023-10-21 DIAGNOSIS — M6281 Muscle weakness (generalized): Secondary | ICD-10-CM | POA: Diagnosis not present

## 2023-10-22 DIAGNOSIS — M6281 Muscle weakness (generalized): Secondary | ICD-10-CM | POA: Diagnosis not present

## 2023-10-22 DIAGNOSIS — N390 Urinary tract infection, site not specified: Secondary | ICD-10-CM | POA: Diagnosis not present

## 2023-10-22 DIAGNOSIS — R278 Other lack of coordination: Secondary | ICD-10-CM | POA: Diagnosis not present

## 2023-10-22 DIAGNOSIS — A419 Sepsis, unspecified organism: Secondary | ICD-10-CM | POA: Diagnosis not present

## 2023-10-22 DIAGNOSIS — R2681 Unsteadiness on feet: Secondary | ICD-10-CM | POA: Diagnosis not present

## 2023-10-23 DIAGNOSIS — N151 Renal and perinephric abscess: Secondary | ICD-10-CM | POA: Diagnosis not present

## 2023-10-23 DIAGNOSIS — E1151 Type 2 diabetes mellitus with diabetic peripheral angiopathy without gangrene: Secondary | ICD-10-CM | POA: Diagnosis not present

## 2023-10-23 DIAGNOSIS — N200 Calculus of kidney: Secondary | ICD-10-CM | POA: Diagnosis not present

## 2023-10-23 DIAGNOSIS — E1165 Type 2 diabetes mellitus with hyperglycemia: Secondary | ICD-10-CM | POA: Diagnosis not present

## 2023-11-05 DIAGNOSIS — M25561 Pain in right knee: Secondary | ICD-10-CM | POA: Diagnosis not present

## 2023-11-13 DIAGNOSIS — R21 Rash and other nonspecific skin eruption: Secondary | ICD-10-CM | POA: Diagnosis not present

## 2023-11-13 DIAGNOSIS — K148 Other diseases of tongue: Secondary | ICD-10-CM | POA: Diagnosis not present

## 2023-11-20 DIAGNOSIS — R21 Rash and other nonspecific skin eruption: Secondary | ICD-10-CM | POA: Diagnosis not present

## 2023-11-20 DIAGNOSIS — E1165 Type 2 diabetes mellitus with hyperglycemia: Secondary | ICD-10-CM | POA: Diagnosis not present

## 2023-11-20 DIAGNOSIS — E1151 Type 2 diabetes mellitus with diabetic peripheral angiopathy without gangrene: Secondary | ICD-10-CM | POA: Diagnosis not present

## 2023-12-09 DIAGNOSIS — L603 Nail dystrophy: Secondary | ICD-10-CM | POA: Diagnosis not present

## 2023-12-09 DIAGNOSIS — Z79899 Other long term (current) drug therapy: Secondary | ICD-10-CM | POA: Diagnosis not present

## 2023-12-09 DIAGNOSIS — L602 Onychogryphosis: Secondary | ICD-10-CM | POA: Diagnosis not present

## 2023-12-09 DIAGNOSIS — E1151 Type 2 diabetes mellitus with diabetic peripheral angiopathy without gangrene: Secondary | ICD-10-CM | POA: Diagnosis not present

## 2023-12-09 DIAGNOSIS — Z794 Long term (current) use of insulin: Secondary | ICD-10-CM | POA: Diagnosis not present

## 2023-12-11 DIAGNOSIS — E1165 Type 2 diabetes mellitus with hyperglycemia: Secondary | ICD-10-CM | POA: Diagnosis not present

## 2023-12-11 DIAGNOSIS — E1151 Type 2 diabetes mellitus with diabetic peripheral angiopathy without gangrene: Secondary | ICD-10-CM | POA: Diagnosis not present
